# Patient Record
Sex: Female | Born: 1950 | Race: Black or African American | Hispanic: No | Smoking: Current some day smoker
Health system: Southern US, Community
[De-identification: ages and names within clinical notes are randomized; demographics above are authoritative.]

## PROBLEM LIST (undated history)

## (undated) DIAGNOSIS — I639 Cerebral infarction, unspecified: Secondary | ICD-10-CM

## (undated) DIAGNOSIS — K219 Gastro-esophageal reflux disease without esophagitis: Secondary | ICD-10-CM

## (undated) DIAGNOSIS — R05 Cough: Secondary | ICD-10-CM

## (undated) DIAGNOSIS — R059 Cough, unspecified: Secondary | ICD-10-CM

## (undated) DIAGNOSIS — D219 Benign neoplasm of connective and other soft tissue, unspecified: Secondary | ICD-10-CM

## (undated) DIAGNOSIS — M549 Dorsalgia, unspecified: Secondary | ICD-10-CM

## (undated) DIAGNOSIS — N189 Chronic kidney disease, unspecified: Secondary | ICD-10-CM

## (undated) DIAGNOSIS — J449 Chronic obstructive pulmonary disease, unspecified: Secondary | ICD-10-CM

## (undated) DIAGNOSIS — Z720 Tobacco use: Secondary | ICD-10-CM

## (undated) DIAGNOSIS — M5416 Radiculopathy, lumbar region: Secondary | ICD-10-CM

## (undated) DIAGNOSIS — I10 Essential (primary) hypertension: Secondary | ICD-10-CM

## (undated) DIAGNOSIS — M1990 Unspecified osteoarthritis, unspecified site: Secondary | ICD-10-CM

## (undated) DIAGNOSIS — E785 Hyperlipidemia, unspecified: Secondary | ICD-10-CM

## (undated) DIAGNOSIS — K5900 Constipation, unspecified: Secondary | ICD-10-CM

## (undated) DIAGNOSIS — E0590 Thyrotoxicosis, unspecified without thyrotoxic crisis or storm: Secondary | ICD-10-CM

## (undated) DIAGNOSIS — R011 Cardiac murmur, unspecified: Secondary | ICD-10-CM

## (undated) DIAGNOSIS — N179 Acute kidney failure, unspecified: Secondary | ICD-10-CM

## (undated) DIAGNOSIS — K635 Polyp of colon: Secondary | ICD-10-CM

## (undated) DIAGNOSIS — E119 Type 2 diabetes mellitus without complications: Secondary | ICD-10-CM

## (undated) DIAGNOSIS — G8929 Other chronic pain: Secondary | ICD-10-CM

## (undated) DIAGNOSIS — Z872 Personal history of diseases of the skin and subcutaneous tissue: Secondary | ICD-10-CM

## (undated) DIAGNOSIS — R0602 Shortness of breath: Secondary | ICD-10-CM

## (undated) DIAGNOSIS — I739 Peripheral vascular disease, unspecified: Secondary | ICD-10-CM

## (undated) DIAGNOSIS — K5790 Diverticulosis of intestine, part unspecified, without perforation or abscess without bleeding: Secondary | ICD-10-CM

## (undated) HISTORY — DX: Tobacco use: Z72.0

## (undated) HISTORY — DX: Cough: R05

## (undated) HISTORY — PX: ABDOMINAL HYSTERECTOMY: SHX81

## (undated) HISTORY — DX: Personal history of diseases of the skin and subcutaneous tissue: Z87.2

## (undated) HISTORY — DX: Type 2 diabetes mellitus without complications: E11.9

## (undated) HISTORY — DX: Polyp of colon: K63.5

## (undated) HISTORY — DX: Thyrotoxicosis, unspecified without thyrotoxic crisis or storm: E05.90

## (undated) HISTORY — DX: Cardiac murmur, unspecified: R01.1

## (undated) HISTORY — DX: Constipation, unspecified: K59.00

## (undated) HISTORY — DX: Chronic obstructive pulmonary disease, unspecified: J44.9

## (undated) HISTORY — DX: Peripheral vascular disease, unspecified: I73.9

## (undated) HISTORY — DX: Acute kidney failure, unspecified: N17.9

## (undated) HISTORY — DX: Diverticulosis of intestine, part unspecified, without perforation or abscess without bleeding: K57.90

## (undated) HISTORY — DX: Benign neoplasm of connective and other soft tissue, unspecified: D21.9

## (undated) HISTORY — PX: COLONOSCOPY W/ BIOPSIES: SHX1374

## (undated) HISTORY — DX: Unspecified osteoarthritis, unspecified site: M19.90

## (undated) HISTORY — DX: Hyperlipidemia, unspecified: E78.5

## (undated) HISTORY — DX: Cough, unspecified: R05.9

## (undated) HISTORY — DX: Cerebral infarction, unspecified: I63.9

---

## 2004-07-23 ENCOUNTER — Ambulatory Visit: Payer: Self-pay | Admitting: Internal Medicine

## 2004-08-19 ENCOUNTER — Ambulatory Visit: Payer: Self-pay | Admitting: Internal Medicine

## 2004-08-23 ENCOUNTER — Ambulatory Visit (HOSPITAL_COMMUNITY): Admission: RE | Admit: 2004-08-23 | Discharge: 2004-08-23 | Payer: Self-pay | Admitting: Internal Medicine

## 2004-08-23 ENCOUNTER — Ambulatory Visit: Payer: Self-pay | Admitting: Internal Medicine

## 2004-11-09 ENCOUNTER — Emergency Department (HOSPITAL_COMMUNITY): Admission: EM | Admit: 2004-11-09 | Discharge: 2004-11-09 | Payer: Self-pay | Admitting: Emergency Medicine

## 2004-11-15 ENCOUNTER — Ambulatory Visit: Payer: Self-pay | Admitting: Internal Medicine

## 2005-10-17 ENCOUNTER — Ambulatory Visit: Payer: Self-pay | Admitting: Internal Medicine

## 2005-10-26 ENCOUNTER — Ambulatory Visit: Payer: Self-pay | Admitting: Internal Medicine

## 2006-02-21 ENCOUNTER — Ambulatory Visit: Payer: Self-pay | Admitting: Internal Medicine

## 2006-02-24 ENCOUNTER — Ambulatory Visit: Payer: Self-pay | Admitting: Internal Medicine

## 2006-04-25 ENCOUNTER — Ambulatory Visit: Payer: Self-pay | Admitting: Internal Medicine

## 2006-04-25 LAB — CONVERTED CEMR LAB
ALT: 17 units/L (ref 0–40)
AST: 20 units/L (ref 0–37)
Albumin: 3.6 g/dL (ref 3.5–5.2)
Alkaline Phosphatase: 64 units/L (ref 39–117)
BUN: 9 mg/dL (ref 6–23)
Basophils Absolute: 0.1 10*3/uL (ref 0.0–0.1)
Basophils Relative: 1.1 % — ABNORMAL HIGH (ref 0.0–1.0)
Bilirubin Urine: NEGATIVE
Bilirubin, Direct: 0.1 mg/dL (ref 0.0–0.3)
CO2: 30 meq/L (ref 19–32)
Calcium: 9.3 mg/dL (ref 8.4–10.5)
Chloride: 99 meq/L (ref 96–112)
Cholesterol: 242 mg/dL (ref 0–200)
Creatinine, Ser: 0.7 mg/dL (ref 0.4–1.2)
Crystals: NEGATIVE
Direct LDL: 176 mg/dL
Eosinophils Absolute: 0.2 10*3/uL (ref 0.0–0.6)
Eosinophils Relative: 2.9 % (ref 0.0–5.0)
GFR calc Af Amer: 111 mL/min
GFR calc non Af Amer: 92 mL/min
Glucose, Bld: 144 mg/dL — ABNORMAL HIGH (ref 70–99)
HCT: 39 % (ref 36.0–46.0)
HDL: 44 mg/dL (ref >39.0)
Hemoglobin, Urine: NEGATIVE
Hemoglobin: 13.6 g/dL (ref 12.0–15.0)
Hgb A1c MFr Bld: 7 % — ABNORMAL HIGH (ref 4.6–6.0)
Ketones, ur: NEGATIVE mg/dL
Leukocytes, UA: NEGATIVE
Lymphocytes Relative: 45.5 % (ref 12.0–46.0)
MCHC: 34.9 g/dL (ref 30.0–36.0)
MCV: 100.7 fL — ABNORMAL HIGH (ref 78.0–100.0)
Monocytes Absolute: 0.6 10*3/uL (ref 0.2–0.7)
Monocytes Relative: 9.9 % (ref 3.0–11.0)
Mucus, UA: NEGATIVE
Neutro Abs: 2.4 10*3/uL (ref 1.4–7.7)
Neutrophils Relative %: 40.6 % — ABNORMAL LOW (ref 43.0–77.0)
Nitrite: NEGATIVE
Platelets: 268 10*3/uL (ref 150–400)
Potassium: 3.8 meq/L (ref 3.5–5.1)
RBC / HPF: NONE SEEN
RBC: 3.88 M/uL (ref 3.87–5.11)
RDW: 12.7 % (ref 11.5–14.6)
Sodium: 135 meq/L (ref 135–145)
Specific Gravity, Urine: 1.025 (ref 1.000–1.03)
TSH: 2.23 microintl units/mL (ref 0.35–5.50)
Total Bilirubin: 0.9 mg/dL (ref 0.3–1.2)
Total CHOL/HDL Ratio: 5.5
Total Protein, Urine: 30 mg/dL — AB
Total Protein: 6.8 g/dL (ref 6.0–8.3)
Triglycerides: 176 mg/dL — ABNORMAL HIGH (ref 0–149)
Urine Glucose: 250 mg/dL — AB
Urobilinogen, UA: 0.2 (ref 0.0–1.0)
VLDL: 35 mg/dL (ref 0–40)
WBC: 6 10*3/uL (ref 4.5–10.5)
pH: 6 (ref 5.0–8.0)

## 2006-05-02 ENCOUNTER — Ambulatory Visit: Payer: Self-pay | Admitting: Internal Medicine

## 2006-09-26 ENCOUNTER — Ambulatory Visit: Payer: Self-pay | Admitting: Internal Medicine

## 2006-09-27 ENCOUNTER — Encounter: Payer: Self-pay | Admitting: Internal Medicine

## 2006-09-27 DIAGNOSIS — I160 Hypertensive urgency: Secondary | ICD-10-CM | POA: Insufficient documentation

## 2006-09-27 DIAGNOSIS — E0590 Thyrotoxicosis, unspecified without thyrotoxic crisis or storm: Secondary | ICD-10-CM

## 2006-09-27 DIAGNOSIS — E785 Hyperlipidemia, unspecified: Secondary | ICD-10-CM

## 2006-09-27 DIAGNOSIS — E1351 Other specified diabetes mellitus with diabetic peripheral angiopathy without gangrene: Secondary | ICD-10-CM | POA: Insufficient documentation

## 2006-09-27 DIAGNOSIS — F17200 Nicotine dependence, unspecified, uncomplicated: Secondary | ICD-10-CM | POA: Insufficient documentation

## 2006-09-27 DIAGNOSIS — I10 Essential (primary) hypertension: Secondary | ICD-10-CM

## 2006-10-10 ENCOUNTER — Ambulatory Visit: Payer: Self-pay | Admitting: Internal Medicine

## 2006-10-10 ENCOUNTER — Ambulatory Visit (HOSPITAL_COMMUNITY): Admission: RE | Admit: 2006-10-10 | Discharge: 2006-10-10 | Payer: Self-pay | Admitting: Internal Medicine

## 2006-12-13 ENCOUNTER — Encounter: Payer: Self-pay | Admitting: *Deleted

## 2006-12-13 DIAGNOSIS — Z9079 Acquired absence of other genital organ(s): Secondary | ICD-10-CM | POA: Insufficient documentation

## 2006-12-13 DIAGNOSIS — L723 Sebaceous cyst: Secondary | ICD-10-CM

## 2007-02-21 ENCOUNTER — Encounter: Payer: Self-pay | Admitting: Internal Medicine

## 2007-05-08 ENCOUNTER — Encounter: Payer: Self-pay | Admitting: Endocrinology

## 2008-02-07 ENCOUNTER — Telehealth: Payer: Self-pay | Admitting: Internal Medicine

## 2008-02-22 ENCOUNTER — Telehealth: Payer: Self-pay | Admitting: Internal Medicine

## 2008-02-27 ENCOUNTER — Telehealth: Payer: Self-pay | Admitting: Internal Medicine

## 2008-04-09 ENCOUNTER — Encounter: Payer: Self-pay | Admitting: Internal Medicine

## 2008-04-09 ENCOUNTER — Ambulatory Visit: Payer: Self-pay | Admitting: Internal Medicine

## 2008-04-09 DIAGNOSIS — R011 Cardiac murmur, unspecified: Secondary | ICD-10-CM | POA: Insufficient documentation

## 2008-04-09 DIAGNOSIS — L600 Ingrowing nail: Secondary | ICD-10-CM

## 2008-04-09 LAB — CONVERTED CEMR LAB
ALT: 33 units/L (ref 0–35)
AST: 28 units/L (ref 0–37)
Albumin: 3.9 g/dL (ref 3.5–5.2)
Alkaline Phosphatase: 61 units/L (ref 39–117)
BUN: 13 mg/dL (ref 6–23)
Basophils Absolute: 0.1 10*3/uL (ref 0.0–0.1)
Basophils Relative: 1 % (ref 0.0–3.0)
Bilirubin Urine: NEGATIVE
Bilirubin, Direct: 0.1 mg/dL (ref 0.0–0.3)
CO2: 34 meq/L — ABNORMAL HIGH (ref 19–32)
Calcium: 9.3 mg/dL (ref 8.4–10.5)
Chloride: 101 meq/L (ref 96–112)
Cholesterol: 194 mg/dL (ref 0–200)
Creatinine, Ser: 0.7 mg/dL (ref 0.4–1.2)
Creatinine,U: 206 mg/dL
Eosinophils Absolute: 0.1 10*3/uL (ref 0.0–0.7)
Eosinophils Relative: 1.6 % (ref 0.0–5.0)
GFR calc non Af Amer: 110.45 mL/min (ref >60)
Glucose, Bld: 153 mg/dL — ABNORMAL HIGH (ref 70–99)
HCT: 41.5 % (ref 36.0–46.0)
HDL: 52.1 mg/dL (ref >39.00)
Hemoglobin, Urine: NEGATIVE
Hemoglobin: 14 g/dL (ref 12.0–15.0)
Hgb A1c MFr Bld: 6.9 % — ABNORMAL HIGH (ref 4.6–6.5)
LDL Cholesterol: 122 mg/dL — ABNORMAL HIGH (ref 0–99)
Leukocytes, UA: NEGATIVE
Lymphocytes Relative: 37.2 % (ref 12.0–46.0)
Lymphs Abs: 2.2 10*3/uL (ref 0.7–4.0)
MCHC: 33.8 g/dL (ref 30.0–36.0)
MCV: 98.1 fL (ref 78.0–100.0)
Microalb Creat Ratio: 679.6 mg/g — ABNORMAL HIGH (ref 0.0–30.0)
Microalb, Ur: 140 mg/dL — ABNORMAL HIGH (ref 0.0–1.9)
Monocytes Absolute: 0.4 10*3/uL (ref 0.1–1.0)
Monocytes Relative: 6.4 % (ref 3.0–12.0)
Neutro Abs: 3.1 10*3/uL (ref 1.4–7.7)
Neutrophils Relative %: 53.8 % (ref 43.0–77.0)
Nitrite: NEGATIVE
Platelets: 262 10*3/uL (ref 150.0–400.0)
Potassium: 3.6 meq/L (ref 3.5–5.1)
RBC: 4.23 M/uL (ref 3.87–5.11)
RDW: 12.3 % (ref 11.5–14.6)
Sodium: 141 meq/L (ref 135–145)
Specific Gravity, Urine: 1.025 (ref 1.000–1.030)
TSH: 1.18 microintl units/mL (ref 0.35–5.50)
Total Bilirubin: 0.9 mg/dL (ref 0.3–1.2)
Total CHOL/HDL Ratio: 4
Total Protein, Urine: >=300 mg/dL
Total Protein: 7.2 g/dL (ref 6.0–8.3)
Triglycerides: 100 mg/dL (ref 0.0–149.0)
Urine Glucose: NEGATIVE mg/dL
Urobilinogen, UA: 0.2 (ref 0.0–1.0)
VLDL: 20 mg/dL (ref 0.0–40.0)
WBC: 5.9 10*3/uL (ref 4.5–10.5)
pH: 5.5 (ref 5.0–8.0)

## 2008-04-11 ENCOUNTER — Ambulatory Visit: Payer: Self-pay | Admitting: Cardiovascular Disease

## 2008-04-11 DIAGNOSIS — R9431 Abnormal electrocardiogram [ECG] [EKG]: Secondary | ICD-10-CM

## 2008-04-14 ENCOUNTER — Ambulatory Visit: Payer: Self-pay | Admitting: Internal Medicine

## 2008-04-16 ENCOUNTER — Encounter: Admission: RE | Admit: 2008-04-16 | Discharge: 2008-04-16 | Payer: Self-pay | Admitting: Internal Medicine

## 2008-04-24 ENCOUNTER — Encounter: Payer: Self-pay | Admitting: Cardiovascular Disease

## 2008-04-24 ENCOUNTER — Ambulatory Visit: Payer: Self-pay

## 2008-04-30 ENCOUNTER — Ambulatory Visit: Payer: Self-pay | Admitting: Internal Medicine

## 2008-07-23 ENCOUNTER — Telehealth: Payer: Self-pay | Admitting: Internal Medicine

## 2008-07-31 ENCOUNTER — Encounter (INDEPENDENT_AMBULATORY_CARE_PROVIDER_SITE_OTHER): Payer: Self-pay | Admitting: *Deleted

## 2008-08-29 ENCOUNTER — Ambulatory Visit: Payer: Self-pay | Admitting: Internal Medicine

## 2008-09-03 ENCOUNTER — Telehealth: Payer: Self-pay | Admitting: Internal Medicine

## 2008-09-04 ENCOUNTER — Ambulatory Visit: Payer: Self-pay | Admitting: Internal Medicine

## 2008-09-04 LAB — CONVERTED CEMR LAB
ALT: 16 units/L (ref 0–35)
AST: 20 units/L (ref 0–37)
Albumin: 3.7 g/dL (ref 3.5–5.2)
Alkaline Phosphatase: 63 units/L (ref 39–117)
BUN: 16 mg/dL (ref 6–23)
Basophils Absolute: 0 10*3/uL (ref 0.0–0.1)
Basophils Relative: 0.6 % (ref 0.0–3.0)
Bilirubin Urine: NEGATIVE
Bilirubin, Direct: 0.1 mg/dL (ref 0.0–0.3)
CO2: 28 meq/L (ref 19–32)
Calcium: 8.9 mg/dL (ref 8.4–10.5)
Chloride: 105 meq/L (ref 96–112)
Cholesterol: 160 mg/dL (ref 0–200)
Creatinine, Ser: 0.7 mg/dL (ref 0.4–1.2)
Eosinophils Absolute: 0.2 10*3/uL (ref 0.0–0.7)
Eosinophils Relative: 2.5 % (ref 0.0–5.0)
GFR calc non Af Amer: 110.3 mL/min (ref >60)
Glucose, Bld: 91 mg/dL (ref 70–99)
HCT: 39 % (ref 36.0–46.0)
HDL: 46.7 mg/dL (ref >39.00)
Hemoglobin, Urine: NEGATIVE
Hemoglobin: 13.4 g/dL (ref 12.0–15.0)
Ketones, ur: NEGATIVE mg/dL
LDL Cholesterol: 84 mg/dL (ref 0–99)
Leukocytes, UA: NEGATIVE
Lymphocytes Relative: 46.5 % — ABNORMAL HIGH (ref 12.0–46.0)
Lymphs Abs: 3.1 10*3/uL (ref 0.7–4.0)
MCHC: 34.4 g/dL (ref 30.0–36.0)
MCV: 98.9 fL (ref 78.0–100.0)
Monocytes Absolute: 0.6 10*3/uL (ref 0.1–1.0)
Monocytes Relative: 9.2 % (ref 3.0–12.0)
Neutro Abs: 2.7 10*3/uL (ref 1.4–7.7)
Neutrophils Relative %: 41.2 % — ABNORMAL LOW (ref 43.0–77.0)
Nitrite: NEGATIVE
Platelets: 199 10*3/uL (ref 150.0–400.0)
Potassium: 3.2 meq/L — ABNORMAL LOW (ref 3.5–5.1)
RBC: 3.94 M/uL (ref 3.87–5.11)
RDW: 12.1 % (ref 11.5–14.6)
Sodium: 140 meq/L (ref 135–145)
Specific Gravity, Urine: 1.025 (ref 1.000–1.030)
TSH: 1.56 microintl units/mL (ref 0.35–5.50)
Total Bilirubin: 1 mg/dL (ref 0.3–1.2)
Total CHOL/HDL Ratio: 3
Total Protein, Urine: 100 mg/dL
Total Protein: 6.6 g/dL (ref 6.0–8.3)
Triglycerides: 146 mg/dL (ref 0.0–149.0)
Urine Glucose: NEGATIVE mg/dL
Urobilinogen, UA: 0.2 (ref 0.0–1.0)
VLDL: 29.2 mg/dL (ref 0.0–40.0)
WBC: 6.6 10*3/uL (ref 4.5–10.5)
pH: 5.5 (ref 5.0–8.0)

## 2008-09-09 ENCOUNTER — Ambulatory Visit: Payer: Self-pay | Admitting: Internal Medicine

## 2008-09-11 ENCOUNTER — Encounter: Payer: Self-pay | Admitting: Internal Medicine

## 2008-09-11 ENCOUNTER — Ambulatory Visit: Payer: Self-pay | Admitting: Internal Medicine

## 2008-09-11 HISTORY — PX: COLONOSCOPY W/ BIOPSIES: SHX1374

## 2008-09-21 ENCOUNTER — Encounter: Payer: Self-pay | Admitting: Internal Medicine

## 2009-04-03 ENCOUNTER — Ambulatory Visit: Payer: Self-pay | Admitting: Endocrinology

## 2009-04-03 DIAGNOSIS — R05 Cough: Secondary | ICD-10-CM

## 2009-04-06 ENCOUNTER — Telehealth: Payer: Self-pay | Admitting: Endocrinology

## 2009-04-20 ENCOUNTER — Ambulatory Visit: Payer: Self-pay | Admitting: Internal Medicine

## 2009-05-01 ENCOUNTER — Telehealth: Payer: Self-pay | Admitting: Internal Medicine

## 2009-05-01 ENCOUNTER — Ambulatory Visit: Payer: Self-pay | Admitting: Endocrinology

## 2009-05-19 ENCOUNTER — Ambulatory Visit: Payer: Self-pay | Admitting: Internal Medicine

## 2009-09-04 ENCOUNTER — Ambulatory Visit: Payer: Self-pay | Admitting: Internal Medicine

## 2009-09-04 LAB — CONVERTED CEMR LAB
ALT: 17 units/L (ref 0–35)
AST: 20 units/L (ref 0–37)
Albumin: 3.8 g/dL (ref 3.5–5.2)
Alkaline Phosphatase: 57 units/L (ref 39–117)
BUN: 23 mg/dL (ref 6–23)
Basophils Absolute: 0.1 10*3/uL (ref 0.0–0.1)
Basophils Relative: 0.8 % (ref 0.0–3.0)
Bilirubin Urine: NEGATIVE
Bilirubin, Direct: 0.2 mg/dL (ref 0.0–0.3)
CO2: 25 meq/L (ref 19–32)
Calcium: 9 mg/dL (ref 8.4–10.5)
Chloride: 103 meq/L (ref 96–112)
Cholesterol: 171 mg/dL (ref 0–200)
Creatinine, Ser: 0.8 mg/dL (ref 0.4–1.2)
Eosinophils Absolute: 0.2 10*3/uL (ref 0.0–0.7)
Eosinophils Relative: 3.7 % (ref 0.0–5.0)
GFR calc non Af Amer: 97.02 mL/min (ref >60)
Glucose, Bld: 79 mg/dL (ref 70–99)
HCT: 35.5 % — ABNORMAL LOW (ref 36.0–46.0)
HDL: 51.5 mg/dL (ref >39.00)
Hemoglobin, Urine: NEGATIVE
Hemoglobin: 12.1 g/dL (ref 12.0–15.0)
Ketones, ur: NEGATIVE mg/dL
LDL Cholesterol: 103 mg/dL — ABNORMAL HIGH (ref 0–99)
Leukocytes, UA: NEGATIVE
Lymphocytes Relative: 37.7 % (ref 12.0–46.0)
Lymphs Abs: 2.4 10*3/uL (ref 0.7–4.0)
MCHC: 34.3 g/dL (ref 30.0–36.0)
MCV: 98.8 fL (ref 78.0–100.0)
Monocytes Absolute: 0.6 10*3/uL (ref 0.1–1.0)
Monocytes Relative: 8.8 % (ref 3.0–12.0)
Neutro Abs: 3.1 10*3/uL (ref 1.4–7.7)
Neutrophils Relative %: 49 % (ref 43.0–77.0)
Nitrite: NEGATIVE
Platelets: 190 10*3/uL (ref 150.0–400.0)
Potassium: 3.6 meq/L (ref 3.5–5.1)
RBC: 3.59 M/uL — ABNORMAL LOW (ref 3.87–5.11)
RDW: 13.3 % (ref 11.5–14.6)
Sodium: 138 meq/L (ref 135–145)
Specific Gravity, Urine: 1.02 (ref 1.000–1.030)
TSH: 0.77 microintl units/mL (ref 0.35–5.50)
Total Bilirubin: 0.8 mg/dL (ref 0.3–1.2)
Total CHOL/HDL Ratio: 3
Total Protein, Urine: 30 mg/dL
Total Protein: 6.8 g/dL (ref 6.0–8.3)
Triglycerides: 82 mg/dL (ref 0.0–149.0)
Urine Glucose: NEGATIVE mg/dL
Urobilinogen, UA: 0.2 (ref 0.0–1.0)
VLDL: 16.4 mg/dL (ref 0.0–40.0)
WBC: 6.4 10*3/uL (ref 4.5–10.5)
pH: 5.5 (ref 5.0–8.0)

## 2009-09-10 ENCOUNTER — Ambulatory Visit: Payer: Self-pay | Admitting: Internal Medicine

## 2009-09-10 DIAGNOSIS — M79609 Pain in unspecified limb: Secondary | ICD-10-CM

## 2009-09-10 DIAGNOSIS — R609 Edema, unspecified: Secondary | ICD-10-CM

## 2009-09-10 LAB — CONVERTED CEMR LAB: Hgb A1c MFr Bld: 6.5 % (ref 4.6–6.5)

## 2009-09-16 ENCOUNTER — Encounter: Admission: RE | Admit: 2009-09-16 | Discharge: 2009-09-16 | Payer: Self-pay | Admitting: Internal Medicine

## 2010-02-23 NOTE — Assessment & Plan Note (Signed)
Summary: BP HIGH/NORINS/LB   Vital Signs:  Patient profile:   60 year old female Height:      67 inches (170.18 cm) Weight:      157.13 pounds (71.42 kg) O2 Sat:      97 % on Room air Temp:     98.1 degrees F (36.72 degrees C) oral Pulse rate:   75 / minute BP sitting:   160 / 80  (left arm)  Vitals Entered By: Gardenia Phlegm RMA (May 01, 2009 3:46 PM)  O2 Flow:  Room air CC: BP high this morning- pt states it was 202/104 at [facility] Is Patient Diabetic? Yes   Primary Provider:  Neena Rhymes MD  CC:  BP high this morning- pt states it was 202/104 at [facility]/ CF.  History of Present Illness: see above.  pt states she feels well in general.  she takes meds as rx'ed.  Current Medications (verified): 1)  Estradiol 0.5 Mg Tabs (Estradiol) .... Take 1 Tablet By Mouth Once A Day 2)  Furosemide 40 Mg  Tabs (Furosemide) .... Once Daily 3)  Amlodipine Besy-Benazepril Hcl 10-20 Mg Caps (Amlodipine Besy-Benazepril Hcl) .[name].. 1 By Mouth Once Daily 4)  Vytorin 10-20 Mg  Tabs (Ezetimibe-Simvastatin) .... Q Pm 5)  Metformin Hcl 1000 Mg  Tabs (Metformin Hcl) .... Two Times A Day 6)  Contour Usb Blood Glucose Sys W/device Kit (Blood Glucose Monitoring Suppl) .... Use Two Times A Day As Directed 7)  Bayer Contour Test  Strp (Glucose Blood) .... Use Two Times A Day As Directed 8)  Metoprolol Succinate 100 Mg Xr24h-Tab (Metoprolol Succinate) .[name].. 1 By Mouth Once Daily For Blood Pressure 9)  Cefuroxime Axetil 250 Mg Tabs (Cefuroxime Axetil) .[name].. 1 Tab Two Times A Day  Allergies (verified): No Known Drug Allergies  Past History:  Past Medical History: Last updated: 04/11/2008 Hx of SEBACEOUS CYST, NECK (ICD-706.2) HYPERTENSION, ESSENTIAL NOS (ICD-401.9) with LVH on ECG TOBACCO ABUSE (ICD-305.1) COUGH (ICD-786.2) HYPERTHYROIDISM (ICD-242.90) HYPERLIPIDEMIA (ICD-272.4) DIABETES MELLITUS, TYPE II (ICD-250.00)    Review of Systems  The patient denies dyspnea on exertion.     Physical Exam  General:  normal appearance.   Extremities:  no edema   Impression & Recommendations:  Problem # 1:  HYPERTENSION, ESSENTIAL NOS (ICD-401.9) predominantly systolic  Medications Added to Medication List This Visit: 1)  Triamterene-hctz 37.5-25 Mg Tabs (Triamterene-hctz) .... 1/2 tab once daily  Other Orders: Est. Patient Level III [id number]  Patient Instructions: 1)  stop furosemide 2)  restart triamterene-hctz, 1/2 pill per day. 3)  see dr Linda Hedges in 2 weeks. Prescriptions: TRIAMTERENE-HCTZ 37.5-25 MG TABS (TRIAMTERENE-HCTZ) 1/2 tab once daily  #30 x 1   Entered and Authorized by:   Donavan Foil MD   Signed by:   Donavan Foil MD on 05/01/2009   Method used:   [facility]        [facility] [address] [phone]* (retail)       [address]       [place], [place]  [address]       Ph: [phone] or [phone]       Fax: [phone]   RxID:   [id number]

## 2010-02-23 NOTE — Assessment & Plan Note (Signed)
Summary: PER PT PHYSICAL--BCBS--STC   Vital Signs:  Patient profile:   60 year old female Height:      67 inches Weight:      152 pounds BMI:     23.89 O2 Sat:      97 % on Room air Temp:     98.4 degrees F oral Pulse rate:   87 / minute BP sitting:   172 / 90  (left arm) Cuff size:   regular  Vitals Entered By: Charlynne Cousins CMA (September 10, 2009 1:30 PM)  O2 Flow:  Room air CC: cpx/ ab  Vision Screening:      Vision Comments: Pt had normal eye exam last year sometime. Unsure of date. But states she is due for an eye exam   Primary Care Provider:  Neena Rhymes MD  CC:  cpx/ ab.  History of Present Illness: [name] is a 60 yo AA F with a history of HTN, hyperlipidemia, and DM type II, presenting for her annual CPX and medication refills.   In the past year [name] reports generally being in good health with only an episode of tonsillitis successfully treated after an urgent care visit and removal of an ingrown toe nail.  Today she complains of a 1 month history of muscle pain in her lower legs bilaterally. She describes this pain as throbbing in nature. She denies a history of trauma, intense exercise, angina pectoris, or dyspnea.   She also complains of a 1 month history of swelling in her hands and feet. These episodes occur at various times of the day, not associated with eating or medication and resolves spontaneously. She is able to make a fist during these episodes and put on her shoes.   [name] has a 10 pack year history of smoking and expresses an interest in quitting.  Current Medications (verified): 1)  Estradiol 0.5 Mg Tabs (Estradiol) .... Take 1 Tablet By Mouth Once A Day 2)  Amlodipine Besy-Benazepril Hcl 10-20 Mg Caps (Amlodipine Besy-Benazepril Hcl) .[name].. 1 By Mouth Once Daily 3)  Vytorin 10-20 Mg  Tabs (Ezetimibe-Simvastatin) .... Q Pm 4)  Metformin Hcl 1000 Mg  Tabs (Metformin Hcl) .... Two Times A Day 5)  Contour Usb Blood Glucose  Sys W/device Kit (Blood Glucose Monitoring Suppl) .... Use Two Times A Day As Directed 6)  Bayer Contour Test  Strp (Glucose Blood) .... Use Two Times A Day As Directed 7)  Metoprolol Succinate 100 Mg Xr24h-Tab (Metoprolol Succinate) .[name].. 1 By Mouth Once Daily For Blood Pressure 8)  Triamterene-Hctz 37.5-25 Mg Tabs (Triamterene-Hctz) .... 1/2 Tab Once Daily  Allergies (verified): No Known Drug Allergies  Past History:  Past Medical History: Last updated: 04/11/2008 Hx of SEBACEOUS CYST, NECK (ICD-706.2) HYPERTENSION, ESSENTIAL NOS (ICD-401.9) with LVH on ECG TOBACCO ABUSE (ICD-305.1) COUGH (ICD-786.2) HYPERTHYROIDISM (ICD-242.90) HYPERLIPIDEMIA (ICD-272.4) DIABETES MELLITUS, TYPE II (ICD-250.00)    Past Surgical History: Last updated: 12/13/2006 HYSTERECTOMY, HX OF (ICD-V45.77)  Family History: Last updated: 10-05-08 Father-deceased @ 4: died of lung cancer, mother - deceased 75:DM, HTN sleep apnea  Neg - breast or colon cancer  Social History: Last updated: 10/05/08 11th grade married '84-13 yrs/divorce 1 son - '68;  Sexually active - using condoms 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  positive - frequency, nocturia x 1  Physical Exam  General:  alert and normal appearance.   Head:  normocephalic and atraumatic.   Eyes:  pupils equal, pupils round, and pupils reactive to light.   Ears:  R ear normal and L ear normal.   Nose:  no external deformity and no external erythema.   Mouth:  no gingival abnormalities and pharynx pink and moist.   Lungs:  normal respiratory effort, no accessory muscle use, no crackles, and no wheezes.   Heart:  normal rate and regular rhythm. Systolic  murmur heard at the aortic and pulmonary valve areas. Abdomen:  soft, non-tender, no distention, no masses, no hepatomegaly, and no splenomegaly.   Pulses:  R radial normal, R posterior tibial normal, L radial normal, and L dorsalis pedis normal.   Extremities:  No edema. Neurologic:  alert & oriented X3 and decreased right patellar reflex. decreased right triceps and biceps reflex. All other DTRs symmetrical and normal.  Left heel has diminished sensation to light touch. Normal sensation to pinprick and vibration. Skin:  turgor normal, color normal, no rashes, and no suspicious lesions.   Psych:  Oriented X3, normally interactive, good eye contact, not anxious appearing, and not depressed appearing.     Impression & Recommendations:  Problem # 1:  LEG PAIN, BILATERAL (ICD-729.5) Assessment - Patient's history and exam suggest her leg pain is secondary to the occupational stress of standing for the majority of her 8 hr shifts at [employer] for the last 36 years.   Plan - Reduce inflammation with (Aleve) Naproxen sodium, 220mg  TABS... 2x/daily          Advised to begin a stretching and exercise routine including basic calf raises and calf stretches.          Advised to invest in a better work shoe, with more comfort and support.           Problem # 2:  PERIPHERAL EDEMA (ICD-782.3) Assessment - Patient's episodes of swelling of her hands and feet are likely not due to an adverse drug reaction or diet. Currently of unknown etiology.  Plan - Monitor for new developments.           Triamterene-hctz 37.5-25 Mg Tabs (Triamterene-hctz) .[name]... 1 tablet daily  Problem # 3:  HYPERTENSION, ESSENTIAL NOS (ICD-401.9) Assessment - Patient's hypertension has been uncontrolled with systolic pressures running in the 150-180 range consistently over time. The patient is on 4 medications for her HTN though she has been taking half a tablet of triamterene-hctz 37.5-25 Mg.  Plan - Increase Triamterene-hctz  37.5-25 Mg Tabs (Triamterene-hctz) from half a tablet daily to one whole tablet daily.           Amlodipine Besy-benazepril Hcl 10-20 Mg Caps (Amlodipine besy-benazepril hcl) .[name]... 1 by mouth once daily          Metoprolol Succinate 100 Mg Xr24h-tab (Metoprolol succinate) .[name]... 1 by mouth once daily for blood pressure      Problem # 4:  HYPERLIPIDEMIA (ICD-272.4) Assessment - Patient's lipid levels are well-controlled as evidenced by lab work last week.  Plan - continue treatment as planned.   Her updated medication list for this problem includes:    Vytorin 10-20 Mg Tabs (Ezetimibe-simvastatin) ..... Q pm  Labs Reviewed: SGOT: 20 (09/04/2009)   SGPT: 17 (09/04/2009)  Prior 10 Yr Risk Heart Disease: 20 % (04/30/2008)   HDL:51.50 (09/04/2009), 46.70 (09/04/2008)  LDL:103 (09/04/2009), 84 (09/04/2008)  Chol:171 (09/04/2009), 160 (09/04/2008)  Trig:82.0 (  09/04/2009), 146.0 (09/04/2008)  Problem # 5:  DIABETES MELLITUS, TYPE II (ICD-250.00) Assessment - On 09/04/2009 Patient's glucose was 79, though her last Hgb A1C was 6.9% in March '10. She is developing minor peripheral neuropathy.   Plan - Obtain a Hgb A1C           Advised patient to schedule an eye exam.               Her updated medication list for this problem includes:    Amlodipine Besy-benazepril Hcl 10-20 Mg Caps (Amlodipine besy-benazepril hcl) .[name]... 1 by mouth once daily    Metformin Hcl 1000 Mg Tabs (Metformin hcl) .[name]..[name] Two times a day  Orders: TLB-A1C / Hgb A1C (Glycohemoglobin) (83036-A1C)  Addendum - Tests: (1) Hemoglobin A1C (A1C)   Hemoglobin A1C            6.5 %                       4.6-6.5     Glycemic Control Guidelines for People with Diabetes:     Non Diabetic:  <6%     Goal of Therapy: <7%     Additional Action Suggested:  >8%   Problem # 6:  TOBACCO ABUSE (ICD-305.1)  Assessment - Patient has a 10 pack year smoking history. She has tried to quit once before which lasted 6 months but was discouraged by  her weight gain. She states now that she is interested in giving quitting another try.  Plan - Patient was counseled on a strategy to quit smoking including -keeping a smoker's diary to assess her own behavior                                                                                                              -replacing her smoking habit with more constructive behaviors                                                                                                              -setting a quit date, taking steps to prepare for a quit date                                                                                                              -  available medications to help her succeed.             Orders: Tobacco use cessation intensive >10 minutes [id number])  Problem # 7:  Preventive Health Care (ICD-V70.0) Assessment - Patient due for mammogram  Plan - Mammogram scheduled at [facility]  Complete Medication List: 1)  Estradiol 0.5 Mg Tabs (Estradiol) .... Take 1 tablet by mouth once a day 2)  Amlodipine Besy-benazepril Hcl 10-20 Mg Caps (Amlodipine besy-benazepril hcl) .[name].. 1 by mouth once daily 3)  Vytorin 10-20 Mg Tabs (Ezetimibe-simvastatin) .... Q pm 4)  Metformin Hcl 1000 Mg Tabs (Metformin hcl) .... Two times a day 5)  Contour Usb Blood Glucose Sys W/device Kit (Blood glucose monitoring suppl) .... Use two times a day as directed 6)  Bayer Contour Test Strp (Glucose blood) .... Use two times a day as directed 7)  Metoprolol Succinate 100 Mg Xr24h-tab (Metoprolol succinate) .[name].. 1 by mouth once daily for blood pressure 8)  Triamterene-hctz 37.5-25 Mg Tabs (Triamterene-hctz) .[name].. 1 tablet daily  Other Orders: Radiology Referral (Radiology) Prescriptions: TRIAMTERENE-HCTZ 37.5-25 MG TABS (TRIAMTERENE-HCTZ) 1 tablet daily  #30 x 12   Entered and Authorized by:   Neena Rhymes MD   Signed by:   Neena Rhymes MD on 09/10/2009   Method used:   Electronically to         [facility] [address] [phone]* (retail)       [address]       [place], [place]  [address]       Ph: [phone] or [phone]       Fax: [phone]   RxID:   [id number] METOPROLOL SUCCINATE 100 MG XR24H-TAB (METOPROLOL SUCCINATE) 1 by mouth once daily for blood pressure  #30 x 12   Entered and Authorized by:   Neena Rhymes MD   Signed by:   Neena Rhymes MD on 09/10/2009   Method used:   Electronically to        [facility] [address] [phone]* (retail)       [place] [address]       [place], [place]  [address]       Ph: [phone] or [phone]       Fax: [phone]   RxID:   [id number] METFORMIN HCL 1000 MG  TABS (METFORMIN HCL) two times a day  #60 Tablet x 12   Entered and Authorized by:   Neena Rhymes MD   Signed by:   Neena Rhymes MD on 09/10/2009   Method used:   Electronically to        [facility] [address] [phone]* (retail)       [place] [address]       [place], [place]  [address]       Ph: [phone] or [phone]       Fax: [phone]   RxID:   [id number] VYTORIN 10-20 MG  TABS (EZETIMIBE-SIMVASTATIN) q PM  #30 Tablet x 12   Entered and Authorized by:   Neena Rhymes MD   Signed by:   Neena Rhymes MD on 09/10/2009   Method used:   Electronically to        [facility] [address] [phone]* (retail)       [place] [address]       [place], [place]  [address]       Ph: [phone] or [phone]       Fax: [phone]   [id number] AMLODIPINE BESY-BENAZEPRIL HCL 10-20 MG CAPS (AMLODIPINE BESY-BENAZEPRIL HCL) 1 by mouth once daily  #30 Capsule x 12   Entered and Authorized by:   Neena Rhymes MD   Signed by:   Neena Rhymes MD on 09/10/2009   Method used:   Electronically to        [facility]  [facility] [address] [phone]* (retail)       [place] [address]       [place], [place]  [address]       Ph: [phone] or  [phone]       Fax: [phone]   RxID:   [id number] ESTRADIOL 0.5 MG TABS (ESTRADIOL) Take 1 tablet by mouth once a day  #30 Tablet x 12   Entered and Authorized by:   Neena Rhymes MD   Signed by:   Neena Rhymes MD on 09/10/2009   Method used:   Electronically to        [facility] [address] [phone]* (retail)       [place] [address]       [place], [place]  [address]       Ph: [phone] or [phone]       Fax: [phone]   RxID:   [id number]   Tests: (1) BMP (METABOL)   Sodium                    138 mEq/L                   135-145   Potassium                 3.6 mEq/L                   3.5-5.1   Chloride                  103 mEq/L                   96-112   Carbon Dioxide            25 mEq/L                    19-32   Glucose                   79 mg/dL                    70-99   BUN                       23 mg/dL                    6-23   Creatinine                0.8 mg/dL                   0.4-1.2   Calcium                   9.0 mg/dL                   8.4-10.5  GFR                       97.02 mL/min                >60  Tests: (2) Lipid Panel (LIPID)   Cholesterol               171 mg/dL                   0-200     ATP III Classification            Desirable:  < 200 mg/dL                    Borderline High:  200 - 239 mg/dL               High:  > = 240 mg/dL   Triglycerides             82.0 mg/dL                  0.0-149.0     Normal:  <150 mg/dL     Borderline High:  150 - 199 mg/dL   HDL                       51.50 mg/dL                 >39.00   VLDL Cholesterol          16.4 mg/dL                  0.0-40.0   LDL Cholesterol      [H]  103 mg/dL                   0-99  CHO/HDL Ratio:  CHD Risk                             3                    Men          Women     1/2 Average Risk     3.4          3.3     Average Risk          5.0          4.4     2X Average Risk          9.6          7.1     3X Average Risk          15.0           11.0                           Tests: (3) CBC Platelet w/Diff (CBCD)   White Cell Count          6.4 K/uL                    4.5-10.5   Red Cell Count       [L]  3.59 Mil/uL                 3.87-5.11   Hemoglobin  12.1 g/dL                   12.0-15.0   Hematocrit           [L]  35.5 %                      36.0-46.0   MCV                       98.8 fl                     78.0-100.0   MCHC                      34.3 g/dL                   30.0-36.0   RDW                       13.3 %                      11.5-14.6   Platelet Count            190.0 K/uL                  150.0-400.0   Neutrophil %              49.0 %                      43.0-77.0   Lymphocyte %              37.7 %                      12.0-46.0   Monocyte %                8.8 %                       3.0-12.0   Eosinophils%              3.7 %                       0.0-5.0   Basophils %               0.8 %                       0.0-3.0   Neutrophill Absolute      3.1 K/uL                    1.4-7.7   Lymphocyte Absolute       2.4 K/uL                    0.7-4.0   Monocyte Absolute         0.6 K/uL                    0.1-1.0  Eosinophils, Absolute                             0.2 K/uL                    0.0-0.7   Basophils  Absolute        0.1 K/uL                    0.0-0.1  Tests: (4) Hepatic/Liver Function Panel (HEPATIC)   Total Bilirubin           0.8 mg/dL                   0.3-1.2   Direct Bilirubin          0.2 mg/dL                   0.0-0.3   Alkaline Phosphatase      57 U/L                      39-117   AST                       20 U/L                      0-37   ALT                       17 U/L                      0-35   Total Protein             6.8 g/dL                    6.0-8.3   Albumin                   3.8 g/dL                    3.5-5.2  Tests: (5) TSH (TSH)   FastTSH                   0.77 uIU/mL                 0.35-5.50  Tests: (6) UDip w/Micro (URINE)   Color                      LT. YELLOW       RANGE:  Yellow;Lt. Yellow   Clarity                   CLEAR                       Clear   Specific Gravity          1.020                       1.000 - 1.030   Urine Ph                  5.5                         5.0-8.0   Protein                   30                          Negative   Urine Glucose  NEGATIVE                    Negative   Ketones                   NEGATIVE                    Negative   Urine Bilirubin           NEGATIVE                    Negative   Blood                     NEGATIVE                    Negative   Urobilinogen              0.2                         0.0 - 1.0   Leukocyte Esterace        NEGATIVE                    Negative   Nitrite                   NEGATIVE                    Negative   Urine WBC                 0-2/hpf                     0-2/hpf   Urine Epith               Few(5-10/hpf)               Rare(0-4/hpf)   Urine Bacteria            Few(10-50/hpf)              None

## 2010-02-23 NOTE — Assessment & Plan Note (Signed)
Summary: COUGH/ NWS / PER TRIAGE/NWS   Vital Signs:  Patient profile:   60 year old female Height:      67 inches (170.18 cm) Weight:      154 pounds (70 kg) BMI:     24.21 O2 Sat:      95 % on Room air Temp:     97 degrees F (36.11 degrees C) oral Pulse rate:   71 / minute BP sitting:   180 / 92  (left arm) Cuff size:   regular  Vitals Entered By: Felipa Evener (April 03, 2009 2:15 PM)  O2 Flow:  Room air CC: cough, chills and chest tightening X 10 days   Primary Provider:  Neena Rhymes MD  CC:  cough and chills and chest tightening X 10 days.  History of Present Illness: pt states 9 days of slight prod-quality cough, chills, and associated wheezing.  no pain in the ears.    Current Medications (verified): 1)  Estradiol 0.5 Mg Tabs (Estradiol) .... Take 1 Tablet By Mouth Once A Day 2)  Furosemide 40 Mg  Tabs (Furosemide) .... Once Daily 3)  Amlodipine Besy-Benazepril Hcl 10-20 Mg Caps (Amlodipine Besy-Benazepril Hcl) .[name].. 1 By Mouth Once Daily 4)  Vytorin 10-20 Mg  Tabs (Ezetimibe-Simvastatin) .... Q Pm 5)  Metformin Hcl 1000 Mg  Tabs (Metformin Hcl) .... Two Times A Day 6)  Metoprolol Succinate 100 Mg Tb24 (Metoprolol Succinate) .... Take 1 Tablet By Mouth Once A Day 7)  Contour Usb Blood Glucose Sys W/device Kit (Blood Glucose Monitoring Suppl) .... Use Two Times A Day As Directed 8)  Bayer Contour Test  Strp (Glucose Blood) .... Use Two Times A Day As Directed  Allergies (verified): No Known Drug Allergies  Past History:  Past Medical History: Last updated: 04/11/2008 Hx of SEBACEOUS CYST, NECK (ICD-706.2) HYPERTENSION, ESSENTIAL NOS (ICD-401.9) with LVH on ECG TOBACCO ABUSE (ICD-305.1) COUGH (ICD-786.2) HYPERTHYROIDISM (ICD-242.90) HYPERLIPIDEMIA (ICD-272.4) DIABETES MELLITUS, TYPE II (ICD-250.00)    Review of Systems  The patient denies dyspnea on exertion.         denies sore throat.  Physical Exam  General:  no distress  Head:  head:  no deformity eyes: no periorbital swelling, no proptosis external nose and ears are normal mouth: no lesion seen Ears:  left tm is red.  right is normal Extremities:  no edema Additional Exam:  k+ was recently 3.2   Impression & Recommendations:  Problem # 1:  uri new  Problem # 2:  wheezing possibly exacerbated by metoprolol  Problem # 3:  hypokalemia mild--could be exacerbated by hctz  Problem # 4:  HYPERTENSION, ESSENTIAL NOS (ICD-401.9) could be exac by reduction of metoprolol  Medications Added to Medication List This Visit: 1)  Metoprolol Succinate 50 Mg Xr24h-tab (Metoprolol succinate) .[name].. 1 once daily 2)  Cefuroxime Axetil 250 Mg Tabs (Cefuroxime axetil) .[name].. 1 tab two times a day 3)  Triamterene-hctz 37.5-25 Mg Tabs (Triamterene-hctz) .... 1/2 tab once daily  Other Orders: T-2 View CXR (71020TC) Est. Patient Level IV [id number])  Patient Instructions: 1)  advair-100, 1 puff two times a day.  rinse mouth after using. 2)  reduce metoprolol-xr to 50 mg once daily. 3)  chest x ray today. 4)  cefuroxime 250 mg two times a day 5)  add triamterene-hctz, 1/2 once daily 6)  Please schedule a follow-up appointment with dr Linda Hedges in 2 weeks. Prescriptions: TRIAMTERENE-HCTZ 37.5-25 MG TABS (TRIAMTERENE-HCTZ) 1/2 tab once daily  #30 x 1  Entered and Authorized by:   Donavan Foil MD   Signed by:   Donavan Foil MD on 04/03/2009   Method used:   Electronically to        [facility] [address] [phone]* (retail)       [address]       [place], [place]  [address]       Ph: [phone] or [phone]       Fax: [phone]   RxID:   [id number] CEFUROXIME AXETIL 250 MG TABS (CEFUROXIME AXETIL) 1 tab two times a day  #14 x 0   Entered and Authorized by:   Donavan Foil MD   Signed by:   Donavan Foil MD on 04/03/2009   Method used:   Electronically to        [facility]  [facility] [address] [phone]* (retail)       [place] [address]       [place], [place]  [address]       Ph: [phone] or [phone]       Fax: [phone]   RxID:   [id number] METOPROLOL SUCCINATE 50 MG XR24H-TAB (METOPROLOL SUCCINATE) 1 once daily  #30 x 11   Entered and Authorized by:   Donavan Foil MD   Signed by:   Donavan Foil MD on 04/03/2009   Method used:   Electronically to        [facility]  [facility] [address] [phone]* (retail)       [place] [address]       [place],   [address]       Ph: [phone] or [phone]       Fax: [phone]   RxID:   [id number]

## 2010-02-23 NOTE — Assessment & Plan Note (Signed)
Summary: 2 wk f/u from sae/men pt/cd   Vital Signs:  Patient profile:   60 year old female Height:      67 inches Weight:      153 pounds BMI:     24.05 O2 Sat:      97 % on Room air Temp:     98.2 degrees F oral Pulse rate:   73 / minute BP sitting:   150 / 84  (left arm) Cuff size:   regular  Vitals Entered By: Charlynne Cousins CMA (April 20, 2009 3:05 PM)  O2 Flow:  Room air CC: pt here for follow up on chest tightness and SOB, pt states that her symptoms are much better/ ab   Primary Care Provider:  Neena Rhymes MD  CC:  pt here for follow up on chest tightness and SOB and pt states that her symptoms are much better/ ab.  History of Present Illness: Seen March 11th by Dr. Loanne Drilling - diagnosed with URI and treated with ceftin and Advair. She had a chest x-ray - reviewed - no acute disease. She finished the antibiotics and has completed the advair. She still has a tickle cough productive of clear phelgm. She stopped the claritin that she had formerly used.  No fever or chills, no diarrhea. No SOB.   Blood pressure was elevated when she saw Dr. Loanne Drilling. Chart reviewed and she has been poorly controlled. There has not been adequate improvement with the addition of a second diuretic.   Current Medications (verified): 1)  Estradiol 0.5 Mg Tabs (Estradiol) .... Take 1 Tablet By Mouth Once A Day 2)  Furosemide 40 Mg  Tabs (Furosemide) .... Once Daily 3)  Amlodipine Besy-Benazepril Hcl 10-20 Mg Caps (Amlodipine Besy-Benazepril Hcl) .[name].. 1 By Mouth Once Daily 4)  Vytorin 10-20 Mg  Tabs (Ezetimibe-Simvastatin) .... Q Pm 5)  Metformin Hcl 1000 Mg  Tabs (Metformin Hcl) .... Two Times A Day 6)  Contour Usb Blood Glucose Sys W/device Kit (Blood Glucose Monitoring Suppl) .... Use Two Times A Day As Directed 7)  Bayer Contour Test  Strp (Glucose Blood) .... Use Two Times A Day As Directed 8)  Metoprolol Succinate 50 Mg Xr24h-Tab (Metoprolol Succinate) .[name].. 1 Once Daily 9)  Cefuroxime Axetil  250 Mg Tabs (Cefuroxime Axetil) .[name].. 1 Tab Two Times A Day 10)  Triamterene-Hctz 37.5-25 Mg Tabs (Triamterene-Hctz) .... 1/2 Tab Once Daily 11)  Advair Diskus 100-50 Mcg/dose Aepb (Fluticasone-Salmeterol) .[name].. 1 Puff Two Times A Day  Allergies (verified): No Known Drug Allergies  Past History:  Past Medical History: Last updated: 04/11/2008 Hx of SEBACEOUS CYST, NECK (ICD-706.2) HYPERTENSION, ESSENTIAL NOS (ICD-401.9) with LVH on ECG TOBACCO ABUSE (ICD-305.1) COUGH (ICD-786.2) HYPERTHYROIDISM (ICD-242.90) HYPERLIPIDEMIA (ICD-272.4) DIABETES MELLITUS, TYPE II (ICD-250.00)    Past Surgical History: Last updated: 12/13/2006 HYSTERECTOMY, HX OF (ICD-V45.77) PSH reviewed for relevance, FH reviewed for relevance  Review of Systems  The patient denies anorexia, fever, weight loss, weight gain, chest pain, syncope, dyspnea on exertion, hemoptysis, severe indigestion/heartburn, incontinence, muscle weakness, difficulty walking, and enlarged lymph nodes.    Physical Exam  General:  WNWD AA female in no distress. Head:  Normocephalic and atraumatic without obvious abnormalities. No apparent alopecia or balding. Lungs:  normal respiratory effort, no intercostal retractions, no accessory muscle use, normal breath sounds, no crackles, and no wheezes.   Heart:  normal rate and regular rhythm.  Feint I/VI murmur at LSB.  Neurologic:  alert & oriented X3, cranial nerves II-XII intact, strength normal  in all extremities, and gait normal.     Impression & Recommendations:  Problem # 1:  COUGH (ICD-786.2) No evidence of on-going infection. suspect irritible cough.  Plan - tessalon perle 100mg  three times a day x 10 days.           Claritin daily  Problem # 2:  HYPERTENSION, ESSENTIAL NOS (ICD-401.9)  The following medications were removed from the medication list:    Triamterene-hctz 37.5-25 Mg Tabs (Triamterene-hctz) .[name]... 1/2 tab once daily Her updated medication list for this problem  includes:    Furosemide 40 Mg Tabs (Furosemide) ..... Once daily    Amlodipine Besy-benazepril Hcl 10-20 Mg Caps (Amlodipine besy-benazepril hcl) .[name]... 1 by mouth once daily    Metoprolol Succinate 100 Mg Xr24h-tab (Metoprolol succinate) .[name]... 1 by mouth once daily for blood pressure  BP today: 150/84 Prior BP: 180/92 (04/03/2009)  Prior 10 Yr Risk Heart Disease: 20 % (04/30/2008)  Plan - stop triameteren/Hct.           increase toprol XR - heart rate of 73 allows for increase.           Monitor BP and report back.   Complete Medication List: 1)  Estradiol 0.5 Mg Tabs (Estradiol) .... Take 1 tablet by mouth once a day 2)  Furosemide 40 Mg Tabs (Furosemide) .... Once daily 3)  Amlodipine Besy-benazepril Hcl 10-20 Mg Caps (Amlodipine besy-benazepril hcl) .[name].. 1 by mouth once daily 4)  Vytorin 10-20 Mg Tabs (Ezetimibe-simvastatin) .... Q pm 5)  Metformin Hcl 1000 Mg Tabs (Metformin hcl) .... Two times a day 6)  Contour Usb Blood Glucose Sys W/device Kit (Blood glucose monitoring suppl) .... Use two times a day as directed 7)  Bayer Contour Test Strp (Glucose blood) .... Use two times a day as directed 8)  Metoprolol Succinate 100 Mg Xr24h-tab (Metoprolol succinate) .[name].. 1 by mouth once daily for blood pressure 9)  Cefuroxime Axetil 250 Mg Tabs (Cefuroxime axetil) .[name].. 1 tab two times a day 10)  Benzonatate 100 Mg Caps (Benzonatate) .[name].. 1 by mouth three times a day  Patient Instructions: 1)  cough - no evidence today of any persistent infection. Suspect an irritative cough +/- allergy. Plan - take benzonatate 100mg  three times a day x 10 days (1 refill if needed) and take claritin (generic loratadine) 10mg  once a day. 2)  Blood pressure - not well controlled. Plan - stop triamterence/Hct 1/2 tab daily recently started. Continue taking amlodipine/benzaepril 10/20 once a day, continue taking furosemide 40mg  daily and increase metoprolol XR to 100mg  daily ( may take 2 x 50mg  until gone). Check  BP at [facility] and report back to me so that I know that your are controlled.  Prescriptions: METOPROLOL SUCCINATE 100 MG XR24H-TAB (METOPROLOL SUCCINATE) 1 by mouth once daily for blood pressure  #30 x 12   Entered and Authorized by:   Neena Rhymes MD   Signed by:   Neena Rhymes MD on 04/21/2009   Method used:   [facility]ron[facility] to        [facility] [address] [phone]* (retail)       [place] [address]       [place], [place]  [address]       Ph: [phone] or [phone]       Fax: [phone]   RxID:   [id number] BENZONATATE 100 MG CAPS (BENZONATATE) 1 by mouth three times a day  #30 x 1  Entered and Authorized by:   Neena Rhymes MD   Signed by:   Neena Rhymes MD on 04/21/2009   Method used:   Electronically to        [facility] [address] [phone]* (retail)       [address]       [place], [place]  [address]       Ph: [phone] or [phone]       Fax: [phone]   RxID:   [id number]

## 2010-02-23 NOTE — Progress Notes (Signed)
Summary: RX request  Phone Note Call from Patient Call back at Home Phone [phone]   Caller: Patient Summary of Call: pt came into clinic requesting a sample of Advair 100 that was given as a sample last OV. pt stated that she was not shown how to use it and when she figured it out she was already out of medication. I was advised per MD to send in an RX to pt pharmacy, [facility]. pt advised Initial call taken by: Crissie Sickles, CMA,  April 06, 2009 10:00 AM    New/Updated Medications: ADVAIR DISKUS 100-50 MCG/DOSE AEPB (FLUTICASONE-SALMETEROL) 1 puff two times a day Prescriptions: ADVAIR DISKUS 100-50 MCG/DOSE AEPB (FLUTICASONE-SALMETEROL) 1 puff two times a day  #1 x 1   Entered by:   Crissie Sickles, CMA   Authorized by:   Donavan Foil MD   Signed by:   Crissie Sickles, CMA on 04/06/2009   Method used:   [facility]ronically to        [facility] [address] [phone]* (retail)       [address]       [place], [place]  [address]       Ph: [phone] or [phone]       Fax: [phone]   RxID:   [id number]

## 2010-02-23 NOTE — Progress Notes (Signed)
Summary: Home BP check  Phone Note Call from Patient   Summary of Call: Pt was advised to call and give BP reading at [facility]. This am 202/104. Would you like pt to come in today for BP check?  Initial call taken by: Charlsie Quest, [place],  May 01, 2009 12:12 PM  Follow-up for Phone Call        by chart pt has hx of hard to control BP;  if has headache, confusion , chest pain , blood in the urine or other symptoms, he should go to ER now  if no symptoms, can make nurse OV for BP check to see if spontaneously improved , to cont same meds for now;  and f/u primary MD next avaiable  Follow-up by: Biagio Borg MD,  May 01, 2009 1:22 PM  Additional Follow-up for Phone Call Additional follow up Details #1::        pt informed and transferred toschedule BP check and f/u with [facility] Additional Follow-up by: Crissie Sickles, [place],  May 01, 2009 1:57 PM

## 2010-02-23 NOTE — Assessment & Plan Note (Signed)
Summary: 2 WK FU  STC   Vital Signs:  Patient profile:   60 year old female Height:      67 inches Weight:      156 pounds BMI:     24.52 O2 Sat:      98 % on Room air Temp:     98.1 degrees F oral Pulse rate:   61 / minute BP sitting:   152 / 88  (left arm) Cuff size:   regular  Vitals Entered By: Charlynne Cousins CMA (May 19, 2009 3:06 PM)  O2 Flow:  Room air CC: pt here for follow up office visit on BP/ ab   Primary Care Provider:  Neena Rhymes MD  CC:  pt here for follow up office visit on BP/ ab.  History of Present Illness: BP has been running high. She did see Dr. Loanne Drilling April 8th and had BP in the 160's She was taken off furosemide and started on triamterine/hct. NO headache, epistaxis. She is feeling well.   Patient admits that she continues to smoke. She has not yet started chantix.  Current Medications (verified): 1)  Estradiol 0.5 Mg Tabs (Estradiol) .... Take 1 Tablet By Mouth Once A Day 2)  Amlodipine Besy-Benazepril Hcl 10-20 Mg Caps (Amlodipine Besy-Benazepril Hcl) .[name].. 1 By Mouth Once Daily 3)  Vytorin 10-20 Mg  Tabs (Ezetimibe-Simvastatin) .... Q Pm 4)  Metformin Hcl 1000 Mg  Tabs (Metformin Hcl) .... Two Times A Day 5)  Contour Usb Blood Glucose Sys W/device Kit (Blood Glucose Monitoring Suppl) .... Use Two Times A Day As Directed 6)  Bayer Contour Test  Strp (Glucose Blood) .... Use Two Times A Day As Directed 7)  Metoprolol Succinate 100 Mg Xr24h-Tab (Metoprolol Succinate) .[name].. 1 By Mouth Once Daily For Blood Pressure 8)  Triamterene-Hctz 37.5-25 Mg Tabs (Triamterene-Hctz) .... 1/2 Tab Once Daily  Allergies (verified): No Known Drug Allergies PMH-FH-SH reviewed-no changes except otherwise noted  Review of Systems  The patient denies anorexia, fever, weight loss, weight gain, chest pain, syncope, dyspnea on exertion, prolonged cough, abdominal pain, depression, and enlarged lymph nodes.    Physical Exam  General:  WNWD AA femlae in no  distress Head:  normocephalic and atraumatic.   Eyes:  corneas and lenses clear and no injection.   Lungs:  normal respiratory effort and normal breath sounds.   Heart:  normal rate and regular rhythm.     Impression & Recommendations:  Problem # 1:  HYPERTENSION, ESSENTIAL NOS (ICD-401.9)  Her updated medication list for this problem includes:    Amlodipine Besy-benazepril Hcl 10-20 Mg Caps (Amlodipine besy-benazepril hcl) .[name]... 1 by mouth once daily    Metoprolol Succinate 100 Mg Xr24h-tab (Metoprolol succinate) .[name]... 1 by mouth once daily for blood pressure    Triamterene-hctz 37.5-25 Mg Tabs (Triamterene-hctz) .[name]... 1/2 tab once daily  BP today: 152/88 Prior BP: 160/80 (05/01/2009)  Prior 10 Yr Risk Heart Disease: 20 % (04/30/2008)  Patient is suboptimally controlled but is on a 5 drug regimen. She is asymptomatic.  Plan - continue present medications. Obtain BP readings when not at the doctors office.  Orders: Tobacco use cessation intermediate 3-10 minutes [id number])  Problem # 2:  TOBACCO ABUSE (ICD-305.1)  Patient continues to smoke. Discussed with her the importance of smoking cessation, more important than tight BP control.  Plan - she has promised to call 1-800-quit-now  Orders: Tobacco use cessation intermediate 3-10 minutes [id number])  Complete Medication List: 1)  Estradiol 0.5 Mg  Tabs (Estradiol) .... Take 1 tablet by mouth once a day 2)  Amlodipine Besy-benazepril Hcl 10-20 Mg Caps (Amlodipine besy-benazepril hcl) .[name].. 1 by mouth once daily 3)  Vytorin 10-20 Mg Tabs (Ezetimibe-simvastatin) .... Q pm 4)  Metformin Hcl 1000 Mg Tabs (Metformin hcl) .... Two times a day 5)  Contour Usb Blood Glucose Sys W/device Kit (Blood glucose monitoring suppl) .... Use two times a day as directed 6)  Bayer Contour Test Strp (Glucose blood) .... Use two times a day as directed 7)  Metoprolol Succinate 100 Mg Xr24h-tab (Metoprolol succinate) .[name].. 1 by mouth once daily for blood  pressure 8)  Triamterene-hctz 37.5-25 Mg Tabs (Triamterene-hctz) .... 1/2 tab once daily

## 2010-03-30 ENCOUNTER — Other Ambulatory Visit: Payer: BC Managed Care – PPO

## 2010-03-30 ENCOUNTER — Ambulatory Visit (INDEPENDENT_AMBULATORY_CARE_PROVIDER_SITE_OTHER): Payer: BC Managed Care – PPO | Admitting: Internal Medicine

## 2010-03-30 ENCOUNTER — Encounter: Payer: Self-pay | Admitting: Internal Medicine

## 2010-03-30 ENCOUNTER — Other Ambulatory Visit: Payer: Self-pay | Admitting: Internal Medicine

## 2010-03-30 DIAGNOSIS — I10 Essential (primary) hypertension: Secondary | ICD-10-CM

## 2010-03-30 DIAGNOSIS — F17200 Nicotine dependence, unspecified, uncomplicated: Secondary | ICD-10-CM

## 2010-03-30 DIAGNOSIS — E119 Type 2 diabetes mellitus without complications: Secondary | ICD-10-CM

## 2010-03-30 DIAGNOSIS — E785 Hyperlipidemia, unspecified: Secondary | ICD-10-CM

## 2010-03-30 LAB — BASIC METABOLIC PANEL
BUN: 11 mg/dL (ref 6–23)
CO2: 30 mEq/L (ref 19–32)
Calcium: 9.5 mg/dL (ref 8.4–10.5)
Chloride: 103 mEq/L (ref 96–112)
Creatinine, Ser: 0.8 mg/dL (ref 0.4–1.2)
GFR: 92.7 mL/min (ref >60.00)
Glucose, Bld: 99 mg/dL (ref 70–99)
Potassium: 4.1 mEq/L (ref 3.5–5.1)
Sodium: 139 mEq/L (ref 135–145)

## 2010-03-30 LAB — HEMOGLOBIN A1C: Hgb A1c MFr Bld: 6.9 % — ABNORMAL HIGH (ref 4.6–6.5)

## 2010-03-31 ENCOUNTER — Encounter: Payer: Self-pay | Admitting: Internal Medicine

## 2010-04-02 ENCOUNTER — Other Ambulatory Visit: Payer: Self-pay | Admitting: Internal Medicine

## 2010-04-02 DIAGNOSIS — I701 Atherosclerosis of renal artery: Secondary | ICD-10-CM

## 2010-04-02 DIAGNOSIS — I10 Essential (primary) hypertension: Secondary | ICD-10-CM

## 2010-04-06 ENCOUNTER — Ambulatory Visit (INDEPENDENT_AMBULATORY_CARE_PROVIDER_SITE_OTHER)
Admission: RE | Admit: 2010-04-06 | Discharge: 2010-04-06 | Disposition: A | Discharge status: Home / Self Care | Payer: BC Managed Care – PPO | Source: Ambulatory Visit | Attending: Internal Medicine | Admitting: Internal Medicine

## 2010-04-06 DIAGNOSIS — I701 Atherosclerosis of renal artery: Secondary | ICD-10-CM

## 2010-04-06 DIAGNOSIS — I10 Essential (primary) hypertension: Secondary | ICD-10-CM

## 2010-04-06 HISTORY — DX: Essential (primary) hypertension: I10

## 2010-04-06 MED ORDER — IOHEXOL 350 MG/ML SOLN
100.0000 mL | Freq: Once | INTRAVENOUS | Status: AC | PRN
  Administered 2010-04-06: 100 mL via INTRAVENOUS

## 2010-04-06 NOTE — Assessment & Plan Note (Signed)
Summary: PER PT REGULAR OV--D/T---STC   Vital Signs:  Patient profile:   60 year old female Height:      67 inches Weight:      154 pounds BMI:     24.21 Temp:     99.1 degrees F oral Pulse rate:   88 / minute Pulse rhythm:   regular Resp:     16 per minute BP sitting:   180 / 90  (left arm) Cuff size:   regular  Vitals Entered By: Jonathon Resides, Gabrielle Dare) (March 30, 2010 3:37 PM) CC: f/u c/o elevated BP, HA and lightheaded, Hypertension Management Is Patient Diabetic? Yes   Primary Care Provider:  Neena Rhymes MD  CC:  f/u c/o elevated BP, HA and lightheaded, and Hypertension Management.  History of Present Illness: [name] presents due to a jump in her blood pressure with readings at 200. She has had some mild light-headed feelings and she has had minor HA. No epistaxis, some blurred vision, no chest pain or discomfort. She does have high salt foods in her diet.   Hypertension History:      She denies headache, chest pain, palpitations, orthopnea, peripheral edema, visual symptoms, neurologic problems, syncope, and side effects from treatment.        Positive major cardiovascular risk factors include female age 36 years old or older, diabetes, hyperlipidemia, hypertension, and current tobacco user.  Negative major cardiovascular risk factors include negative family history for ischemic heart disease.        Positive history for target organ damage include cardiac end organ damage (either CHF or LVH).  Further assessment for target organ damage reveals no history of ASHD, stroke/TIA, peripheral vascular disease, renal insufficiency, or hypertensive retinopathy.     Current Medications (verified): 1)  Estradiol 0.5 Mg Tabs (Estradiol) .... Take 1 Tablet By Mouth Once A Day 2)  Amlodipine Besy-Benazepril Hcl 10-20 Mg Caps (Amlodipine Besy-Benazepril Hcl) .[name].. 1 By Mouth Once Daily 3)  Vytorin 10-20 Mg  Tabs (Ezetimibe-Simvastatin) .... Q Pm 4)  Metformin Hcl 1000 Mg   Tabs (Metformin Hcl) .... Two Times A Day 5)  Contour Usb Blood Glucose Sys W/device Kit (Blood Glucose Monitoring Suppl) .... Use Two Times A Day As Directed 6)  Bayer Contour Test  Strp (Glucose Blood) .... Use Two Times A Day As Directed 7)  Metoprolol Succinate 100 Mg Xr24h-Tab (Metoprolol Succinate) .[name].. 1 By Mouth Once Daily For Blood Pressure 8)  Triamterene-Hctz 37.5-25 Mg Tabs (Triamterene-Hctz) .[name].. 1 Tablet Daily  Allergies (verified): No Known Drug Allergies  Past History:  Past Medical History: Last updated: 04/11/2008 Hx of SEBACEOUS CYST, NECK (ICD-706.2) HYPERTENSION, ESSENTIAL NOS (ICD-401.9) with LVH on ECG TOBACCO ABUSE (ICD-305.1) COUGH (ICD-786.2) HYPERTHYROIDISM (ICD-242.90) HYPERLIPIDEMIA (ICD-272.4) DIABETES MELLITUS, TYPE II (ICD-250.00)    Past Surgical History: Last updated: 12/13/2006 HYSTERECTOMY, HX OF (ICD-V45.77)  Family History: Last updated: 12-Sep-2008 Father-deceased @ 17: died of lung cancer, mother - deceased 75:DM, HTN sleep apnea  Neg - breast or colon cancer  Social History: Last updated: 09/12/08 11th grade married '84-13 yrs/divorce 1 son - '68;  Sexually active - using condoms 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    Physical Exam  General:  Well-developed,well-nourished,in no acute distress; alert,appropriate and cooperative throughout examination Head:  Normocephalic and atraumatic without obvious abnormalities.  No apparent alopecia or balding. Eyes:  PERRLA, Fundi - cooper wiring, AV nicking, no exudate or hemorrhage. Lungs:  normal respiratory effort.   Heart:  normal rate, regular rhythm, no murmur, and no JVD.     Impression & Recommendations:  Problem # 1:  HYPERTENSION,  ESSENTIAL NOS (ICD-401.9) Very poor control on 4 medications with progressive elevation. She has multiple risk factors for ASVD related RAS as contributing factor to HTN.  Plan - continue present medication with a change to furosemide for diuretic           CT angio renal arteries to r/o RAS  Her updated medication list for this problem includes:    Amlodipine Besy-benazepril Hcl 10-20 Mg Caps (Amlodipine besy-benazepril hcl) .[name]... 1 by mouth once daily    Metoprolol Succinate 100 Mg Xr24h-tab (Metoprolol succinate) .[name]... 1 by mouth once daily for blood pressure    Furosemide 40 Mg Tabs (Furosemide) .[name]... 1 by mouth once daily  Orders: Radiology Referral (Radiology) TLB-BMP (Basic Metabolic Panel-BMET) ([id number])  Problem # 2:  TOBACCO ABUSE (ICD-305.1)  Patient is strongly, if not adamantly encouraged to quit smoking especially in light of her many risk factors.  Orders: Tobacco use cessation intermediate 3-10 minutes [id number]  Problem # 3:  HYPERLIPIDEMIA (ICD-272.4)  Her updated medication list for this problem includes:    Vytorin 10-20 Mg Tabs (Ezetimibe-simvastatin) ..... Q pm  Labs Reviewed: SGOT: 20 (09/04/2009)   SGPT: 17 (09/04/2009)  10 Yr Risk Heart Disease: > 32 % Prior 10 Yr Risk Heart Disease: 20 % (04/30/2008)   HDL:51.50 (09/04/2009), 46.70 (09/04/2008)  LDL:103 (09/04/2009), 84 (09/04/2008)  Chol:171 (09/04/2009), 160 (09/04/2008)  Trig:82.0 (09/04/2009), 146.0 (09/04/2008)  Will continue prsent medications. She will be due for lipid panel in late sring or summer. She is encouraged to be more adherent to a low salt and LOW FAT diet.   Problem # 4:  DIABETES MELLITUS, TYPE II (ICD-250.00) For A1C today with recommendations to follow  Her updated medication list for this problem includes:    Amlodipine Besy-benazepril Hcl 10-20 Mg Caps (Amlodipine besy-benazepril hcl) .[name]... 1 by mouth once daily    Metformin Hcl 1000 Mg Tabs (Metformin hcl) .[name]..[name] Two times  a day  Orders: TLB-A1C / Hgb A1C (Glycohemoglobin) (83036-A1C)  Addendum - A1C confirms good control.  Complete Medication List: 1)  Estradiol 0.5 Mg Tabs (Estradiol) .... Take 1 tablet by mouth once a day 2)  Amlodipine Besy-benazepril Hcl 10-20 Mg Caps (Amlodipine besy-benazepril hcl) .[name].. 1 by mouth once daily 3)  Vytorin 10-20 Mg Tabs (Ezetimibe-simvastatin) .... Q pm 4)  Metformin Hcl 1000 Mg Tabs (Metformin hcl) .... Two times a day 5)  Contour Usb Blood Glucose Sys W/device Kit (Blood glucose monitoring suppl) .... Use two times a day as directed 6)  Bayer Contour Test Strp (Glucose blood) .... Use two times a day as directed 7)  Metoprolol Succinate 100 Mg Xr24h-tab (Metoprolol succinate) .[name].. 1 by mouth once daily for blood pressure 8)  Furosemide 40 Mg Tabs (Furosemide) .[name].. 1 by mouth once daily  Hypertension Assessment/Plan:      The patient's hypertensive risk group is category C: Target organ damage and/or diabetes.  Her calculated 10 year risk of coronary heart disease is > 32 %.  Today's blood pressure is 180/90.  Her blood pressure goal is < 130/80.  Patient: [name] Note: All result statuses are Final unless otherwise noted.  Tests: (1) Hemoglobin A1C (A1C)   Hemoglobin A1C       [  H]  6.9 %                       4.6-6.5     Glycemic Control Guidelines for People with Diabetes:     Non Diabetic:  <6%     Goal of Therapy: <7%     Additional Action Suggested:  >8%   Tests: (2) BMP (METABOL)   Sodium                    139 mEq/L                   135-145   Potassium                 4.1 mEq/L                   3.5-5.1   Chloride                  103 mEq/L                   96-112   Carbon Dioxide            30 mEq/L                    19-32   Glucose                   99 mg/dL                    70-99   BUN                       11 mg/dL                    6-23   Creatinine                0.8 mg/dL                   0.4-1.2   Calcium                   9.5  mg/dL                   8.4-10.5   GFR                       92.70 mL/min                >60.00   Patient Instructions: 1)  Blood pressure - poor control. Plan - stop  Triam/Hctz; start furosemide 40mg  once a day; lab today to check kidney function and potassium; CT angiogram to rule out renal artery stenosis - my staff will call you with an appointment time when we get permission from the insurance company.  Prescriptions: FUROSEMIDE 40 MG TABS (FUROSEMIDE) 1 by mouth once daily  #30 x 12   Entered and Authorized by:   Neena Rhymes MD   Signed by:   Neena Rhymes MD on 03/30/2010   Method used:   [facility] to        [facility] [address] [phone]* (retail)       [address]       [place], [place]  [address]       Ph: [phone] or [phone]  Fax: [phone]   [id number]    Orders Added: 1)  Radiology Referral [Radiology] 2)  TLB-A1C / Hgb A1C (Glycohemoglobin) [83036-A1C] 3)  TLB-BMP (Basic Metabolic Panel-BMET) [id number] 4)  Est. Patient Level IV [id number] 5)  Tobacco use cessation intermediate 3-10 minutes [id number]

## 2010-04-06 NOTE — Letter (Signed)
   [facility] [address], [place]  [address] Phone: [phone]      April 01, 2010   [place], [place] [address]  RE:  LAB RESULTS  Dear  Ms. CADWELL,  The following is an interpretation of your most recent lab tests.  Please take note of any instructions provided or changes to medications that have resulted from your lab work.  ELECTROLYTES:  Good - no changes needed  KIDNEY FUNCTION TESTS:  Good - no changes needed    DIABETIC STUDIES:  Excellent - no changes needed Blood Glucose: 99   HgbA1C: 6.9   Microalbumin/Creatinine Ratio: 679.6     Please come see me if you have any questions about these lab results.   Sincerely Yours,    Neena Rhymes MD

## 2010-05-01 LAB — GLUCOSE, CAPILLARY
Glucose-Capillary: 166 mg/dL — ABNORMAL HIGH (ref 70–99)
Glucose-Capillary: 75 mg/dL (ref 70–99)

## 2010-05-28 ENCOUNTER — Encounter: Payer: Self-pay | Admitting: Internal Medicine

## 2010-05-28 ENCOUNTER — Other Ambulatory Visit (INDEPENDENT_AMBULATORY_CARE_PROVIDER_SITE_OTHER): Payer: BC Managed Care – PPO

## 2010-05-28 ENCOUNTER — Ambulatory Visit (INDEPENDENT_AMBULATORY_CARE_PROVIDER_SITE_OTHER): Payer: BC Managed Care – PPO | Admitting: Internal Medicine

## 2010-05-28 DIAGNOSIS — R259 Unspecified abnormal involuntary movements: Secondary | ICD-10-CM

## 2010-05-28 DIAGNOSIS — R252 Cramp and spasm: Secondary | ICD-10-CM

## 2010-05-28 DIAGNOSIS — R251 Tremor, unspecified: Secondary | ICD-10-CM

## 2010-05-28 LAB — COMPREHENSIVE METABOLIC PANEL
ALT: 19 U/L (ref 0–35)
AST: 22 U/L (ref 0–37)
Albumin: 3.9 g/dL (ref 3.5–5.2)
Alkaline Phosphatase: 61 U/L (ref 39–117)
BUN: 8 mg/dL (ref 6–23)
CO2: 27 mEq/L (ref 19–32)
Calcium: 8.9 mg/dL (ref 8.4–10.5)
Chloride: 101 mEq/L (ref 96–112)
Creatinine, Ser: 0.7 mg/dL (ref 0.4–1.2)
GFR: 107.87 mL/min (ref >60.00)
Glucose, Bld: 124 mg/dL — ABNORMAL HIGH (ref 70–99)
Potassium: 3.6 mEq/L (ref 3.5–5.1)
Sodium: 140 mEq/L (ref 135–145)
Total Bilirubin: 1 mg/dL (ref 0.3–1.2)
Total Protein: 7.1 g/dL (ref 6.0–8.3)

## 2010-05-28 LAB — CBC WITH DIFFERENTIAL/PLATELET
Basophils Absolute: 0 10*3/uL (ref 0.0–0.1)
Basophils Relative: 0.7 % (ref 0.0–3.0)
Eosinophils Absolute: 0.1 10*3/uL (ref 0.0–0.7)
Eosinophils Relative: 1.8 % (ref 0.0–5.0)
HCT: 38.8 % (ref 36.0–46.0)
Hemoglobin: 13.2 g/dL (ref 12.0–15.0)
Lymphocytes Relative: 26.5 % (ref 12.0–46.0)
Lymphs Abs: 1.7 10*3/uL (ref 0.7–4.0)
MCHC: 34.1 g/dL (ref 30.0–36.0)
MCV: 98.9 fl (ref 78.0–100.0)
Monocytes Absolute: 0.5 10*3/uL (ref 0.1–1.0)
Monocytes Relative: 7.6 % (ref 3.0–12.0)
Neutro Abs: 4 10*3/uL (ref 1.4–7.7)
Neutrophils Relative %: 63.4 % (ref 43.0–77.0)
Platelets: 210 10*3/uL (ref 150.0–400.0)
RBC: 3.92 Mil/uL (ref 3.87–5.11)
RDW: 13.1 % (ref 11.5–14.6)
WBC: 6.4 10*3/uL (ref 4.5–10.5)

## 2010-05-28 LAB — TSH: TSH: 1.07 u[IU]/mL (ref 0.35–5.50)

## 2010-05-28 LAB — MAGNESIUM: Magnesium: 1.6 mg/dL (ref 1.5–2.5)

## 2010-05-28 NOTE — Progress Notes (Signed)
  Subjective:    Patient ID: [name], female    DOB: [birth year], 60 y.o.   MRN: [id number]  HPI Patient present for evaluation of a 24 hr episode of tremor, leg cramps and weakness. He has had no fever, chills, change in bowel habits, respiratory symptoms or chest pain. She has not had any focal weakness or paresthesia. No travel, no new foods, no sick contact.   PMH, FamHx and SocHx reviewed for any changes and relevance.    Review of Systems Review of Systems  Constitutional:  Negative for fever, chills, activity change and unexpected weight change.  HENT:  Negative for hearing loss, ear pain, congestion, neck stiffness and postnasal drip.   Eyes: Negative for pain, discharge and visual disturbance.  Respiratory: Negative for chest tightness and wheezing.   Cardiovascular: Negative for chest pain and palpitations.       [No decreased exercise tolerance Gastrointestinal: [No change in bowel habit. No bloating or gas. No reflux or indigestion Genitourinary: Negative for urgency, frequency, flank pain and difficulty urinating.  Musculoskeletal: Negative for myalgias, back pain, arthralgias and gait problem.  Neurological: Negative for dizziness,headaches. Positive for fine tremor and weakness Hematological: Negative for adenopathy.  Psychiatric/Behavioral: Negative for behavioral problems and dysphoric mood.       Objective:   Physical Exam WNWD AA woman in no distress HEENT- nl Neck - supple, no thyromegaly Lungs - clear Cor - RRR, I-II/VI murmur best at LSB Abd - soft, BS +, no G/R Neuro - A&O x 3, CN II-XII normal, DTRs normal at biceps, radial and patellar tendons, nl rapid alternating finger movement, no dysdiadochokinesia, nol Heel-shin maneuver.      Assessment & Plan:  1. Leg cramps - new on-set. No predisposing events.   Plan- lab to r/o low potassium or magnesium  2. Tremor/shakiness - normal physical exam.  Plan - lab to r/o anemia, electrolyte  derangement, thyroid abnormality  3. Bone health - advised patient to take calcium and vit D - see patient instructions.

## 2010-05-28 NOTE — Patient Instructions (Signed)
Leg cramps and tremors - exam is normal. Will check lab to be sure potassium and magnesium are normal, that kidney function is normal, that blood counts are normal without anemia and to be sure thyroid function is normal. If there is something wrong you will hear soon, otherwise you will get a letter with the results. In the meantime be sure to eat healthy and stay hydrated.  Bone health - you need 1200 mg a day of calcium from diet and supplement combined: so you should take tums or calcium tablets for 1000 mg daily; you need to take 800 international units a day of vit D, available as over the counter supplements.  Stroke prevention - take 81 mg of aspirin daily. Leg Cramps Leg cramps that occur during exercise can be caused by poor circulation. However, muscle cramps that occur at rest or during the night are usually not due to any serious medical problem. Heat cramps may cause muscle spasms during hot weather.   CAUSE There is no clear cause for muscle cramps. However, dehydration may be a factor for those who exercise in the heat. Imbalances in the level of sodium, potassium, calcium or magnesium in the muscle tissue may also be a factor. TREATMENT  Make sure your diet has enough fluids and essential minerals for the muscle to work normally.   Avoid strenuous exercise for several days if you have been having frequent leg cramps.   Stretch and massage the cramped muscle for several minutes.   Some medicines may be helpful in some patients with night cramps. Only take over-the-counter or prescription medicines as directed by your caregiver.  SEEK IMMEDIATE MEDICAL CARE IF:  Your leg cramps become worse.   Your foot becomes cold, numb or blue.  Document Released: 02/18/2004 Document Re-Released: 04/08/2008 [facility] Patient Information 2011 [place].Bone Health Our bones do many things. They provide structure, protect organs, anchor muscles, and store calcium. Adequate calcium in your  diet and weight-bearing physical activity help build strong bones, improve bone amounts, and may reduce the risk of weakening of bones (osteoporosis) later in life. PEAK BONE MASS By age 94, the average woman has acquired most of her skeletal bone mass. A large decline occurs in older adults which increases the risk of osteoporosis. In women this occurs around the time of menopause. It is important for young girls to reach their peak bone mass in order to maintain bone health throughout life. A person with high bone mass as a young adult will be more likely to have a higher bone mass later in life. Not enough calcium consumption and physical activity early on could result in a failure to achieve optimum bone mass in adulthood. OSTEOPOROSIS Osteoporosis is a disease of the bones. It is defined as low bone mass with deterioration of bone structure. Osteoporosis leads to an increase risk of fractures with falls. These fractures commonly happen in the wrist, hip, and spine. While men and women of all ages and background can develop osteoporosis, some of the risk factors for osteoporosis are:  Female.   White/Caucasian.   Post menopausal.   Older adults.   Small in body size.   Eating a diet low in calcium.   Physically inactive.   Smoking.   Use of some medications such as prednisone.   Family history.  CALCIUM Calcium is a mineral needed by the body for healthy bones, teeth, and proper function of the heart, muscles, and nerves. The body cannot produce calcium so it  must be absorbed through food. Good sources of calcium include:  Dairy products (low fat or nonfat milk, cheese, and yogurt).   Dark green leafy vegetables (bok choy and broccoli).   Calcium fortified foods (orange juice, cereal, bread, soy beverages, and tofu products).   Nuts (almonds).  Recommended amounts of calcium vary for individuals. Below is a guide for how much calcium you should take in daily, as outlined by  the [employer]. RECOMMENDED CALCIUM INTAKES AGE AMOUNT MG/DAY Birth - 6 months . . . . 210 mg 6 months - 1 year . . . 270 mg 1 - 3 years . . . . . . . [name]500 mg 4 - 8 years . . . . . . . [name]800 mg 9 - 13 years . . . . . . .1300 mg 14 - 18 years . . . . . .1300 mg 19 - 30 years . . . . . [name]1000 mg 31 - 50 years . . . . . 1000 mg 51 - 70 years . . . . . 1200 mg 70 or older . . . . . . . . 1200 mg Pregnant & lactating . 1000 mg 14 - 18 years . . . . . . 1300 mg 19 - 50 years . . . . . . 1000 mg Source: Dietary Reference Intakes for Calcium, [employer], 2009 Vitamin D also plays an important role in healthy bone development. Vitamin D helps in the absorption of calcium (this is why milk is fortified with vitamin D). For more information on calcium and children visit the [place] (NICHD) Website at LemonLog.is. WEIGHT-BEARING PHYSICAL ACTIVITY Regular physical activity has many positive health benefits. Benefits include strong bones. Weight-bearing physical activity early in life is important in reaching peak bone mass. Weight-bearing physical activities cause muscles and bones to work against gravity. Some examples of weight bearing physical activities include:  Walking, jogging, or running.     [employer].     Jumping rope.     Dancing.    Soccer.    Tennis or Racquetball.     Stair climbing.     Basketball.    Hiking.    Weight lifting.     Aerobic fitness classes.     Including weight-bearing physical activity into an exercise plan is a great way to keep bones healthy and meet physical activity recommendations set forth in the Dietary Guidelines for Americans. Adults: Engage in at least 30 minutes of moderate physical activity on most, preferably all, days of the week. Children: Engage in at least 60 minutes of moderate physical activity on most, preferably all, days  of the week. REFERENCES For more information on the Dietary Guidelines for Americans please visit the Web site at: ForwardButton.com.br.htm For more information on bone health and osteoporosis please visit the [place] online at https://www.norman-romero.com/ Document Released: 04/02/2003 Document Re-Released: 06/30/2009 [facility] Patient Information 2011 [place].

## 2010-05-31 ENCOUNTER — Encounter: Payer: Self-pay | Admitting: Internal Medicine

## 2010-06-08 NOTE — Assessment & Plan Note (Signed)
[facility]                            CARDIOLOGY OFFICE NOTE   NAME:[name], [name]                    MRN:          [id number]  DATE:04/11/2008                            DOB:          [birth year]    This is a 60 year old patient referred by Dr. Ronnald Ramp for apparent chest  pain, murmur, and an abnormal EKG.  In talking to the patient, she  actually has significant back pain.  Her pain is under her right rib  area and towards the mid spine area that sounds like she has pulled a  muscle.  She has difficulty laying flat.  She has increased pain with  prolonged sitting in 1 position.  She works at [employer] and has some  pain, when she is walking and working on [employer].  In my mind, this  pain is clearly musculoskeletal.   She had an EKG done and was repeated here.  She appears to have sinus  rhythm with LVH and left axis deviation.   She apparently had a murmur as well.   In talking to the patient, she has not had previous exertional dyspnea,  PND, or orthopnea.  There has been no previous chest pain.  No previous  history of rheumatic fever or valvular heart disease.  She has not had a  previous cardiac workup back in 2000.  She did have an echocardiogram  that showed moderate LVH with MR and TR.   At that point, her septal thickness was 15 mm.   The patient is a smoker.  Coronary risk factors otherwise include  hypertension.   She has not tried nonsteroidals and Dr. Ronnald Ramp did not give her any  medication for her back pain.   PAST MEDICAL HISTORY:  Otherwise unremarkable.  She has not had previous  surgery.  There is a question of borderline diabetes and hypertension.   FAMILY HISTORY:  Remarkable for mother and father being deceased.  Mother had high blood pressure and diabetes.  Father had lung cancer.  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  She is fairly inactive.  She  smokes a pack a day.  She is divorced and has 1 child.   She  drinks on occasion and smokes pack a day.   MEDICATIONS:  1. Amlodipine 20 mg a day.  2. Metformin 1 g b.i.d.  3. Vytorin 10/20.  4. Lasix 40 a day.  5. Toprol 100 a day.  6. Estradiol.   No known allergies.   PHYSICAL EXAMINATION:  GENERAL:  Remarkable for black female and some  pain holding her back.  VITAL SIGNS:  Weight is 154, blood pressure is 150/80, pulse is 56 and  regular, respiratory rate 14, and afebrile.  HEENT:  Unremarkable.  NECK:  Carotids are normal without bruit.  No lymphadenopathy,  thyromegaly, or JVP elevation.  LUNGS:  Clear.  Good diaphragmatic motion.  No wheezing.  Pain to  palpation over the right posterior back area just above the iliac crest.  CARDIAC:  S1 and S2 with a soft systolic ejection murmur.  PMI is  normal.  ABDOMEN:  Benign.  Bowel sounds positive.  No AAA.  No tenderness.  No  bruit.  No hepatosplenomegaly.  No hepatojugular reflux.  EXTREMITIES:  Distal pulses are intact.  No edema.  NEUROLOGIC:  Nonfocal.  SKIN:  Warm and dry.  No muscular weakness.   EKG showing sinus bradycardia with left axis deviation and LVH.   IMPRESSION:  1. Atypical chest and back pain.  To me, this is clearly      musculoskeletal.  I will give her some Percocet and Flexeril.  She      needs to see Dr. Linda Hedges next week for further workup of this.  In      light of her multiple coronary risk factors including      hypercholesterolemia, hypertension and diabetes, we will do a      followup dobutamine echo.  2. Previous history of left ventricular hypertrophy.  This is probably      represented in her EKG.  We will check her septal thickness,      previous valvular disease, and rule out coronary artery disease,      all with a dobutamine echo.  3. Hypertension.  Continue current therapy, seems well controlled.  4. Diabetes.  Follow up with Dr. Linda Hedges.  Continue metformin.      Hemoglobin A1c quarterly.  5. Hyperlipidemia.  Continue Vytorin 10/20.  Lipid and  liver profile      per Dr. Linda Hedges.   As long as the patient's dobutamine echo is normal, she will be seen on  an as-needed basis.  Hopefully, the Percocet and Flexeril will help with  her back pain.     Wallis Bamberg. Johnsie Cancel, MD, [facility]  Electronically Signed    PCN/MedQ  DD: 04/11/2008  DT: 04/12/2008  Job #: [id number]   cc:   Heinz Knuckles. Norins, MD  Scarlette Calico, MD

## 2010-06-11 NOTE — Assessment & Plan Note (Signed)
[facility]                           PRIMARY CARE OFFICE NOTE   NAME:WILLIAMSMallak, Rapier                    MRN:          [id number]  DATE:05/02/2006                            DOB:          11/13/1950    Ms. Robbs was seen today for a full physical examination.  During  that evaluation, she was noted to have a lipid level with an LDL of 176.   Patient's examination is previously documented.   Lipids:  Discussed this at length with the patient in regards to the  importance of controlling all potential risk factors in a diabetic.  Explained her 18-fold increased risk of heart disease because of her  diabetes.  Stressed the importance of risk reduction.  Adamantly  encouraged the patient to resume taking Vytorin for control of her  lipids.  She has agreed.   PLAN:  Have the patient to return for follow-up lipid panel in one  month.     Heinz Knuckles Norins, MD  Electronically Signed    MEN/MedQ  DD: 05/02/2006  DT: 05/03/2006  Job #: [id number]

## 2010-06-11 NOTE — Assessment & Plan Note (Signed)
[facility]                           PRIMARY CARE OFFICE NOTE   NAME:WILLIAMSImona, [name]                    MRN:          [id number]  DATE:05/02/2006                            DOB:          12-04-1950    [name] is a 60 year old, African-American woman followed for  hypertension  who presents for a followup evaluation and exam. She was  last seen in the office February 21, 2006. She was noted to have a 5-mm  nodule in the left lung on CT scan February 24, 2006. This was a followup  study from a previous examination October 26, 2005 and there was no  change. The patient also had a cyst at the right posterior neck. She was  referred to Dr. Georgette Dover for drainage which was performed in the office  February 6, [id number] without complications.   The patient reports she is feeling okay and has no specific complaints.  On questioning, she does admit to not taking her antihyperlipemic  medication.   PAST MEDICAL HISTORY:   SURGICAL:  Hysterectomy.   MEDICAL:  1. Usual childhood diseases.  2. Hypertension.  3. NIDDM.  4. Hyperlipidemia.   FAMILY HISTORY:  Father died of lung cancer, mother is currently in the  hospital with respiratory failure on a trach at [place]. There is no  history of breast cancer or colon cancer.   SOCIAL HISTORY:  The patient was married but now divorced. She lives  singly. She works for [employer].   CURRENT MEDICATIONS:  1. Estratest daily.  2. Toprol XL 100 mg daily.  3. Lasix 40 mg daily.  4. Vytorin 10/20 daily but not taking it.  5. Metformin 1 gram b.i.d.  6. Lotrel 10/20 daily.   REVIEW OF SYSTEMS:  Negative for any constitutional, cardiovascular,  respiratory, GI or GU problems.   PHYSICAL EXAMINATION:  VITAL SIGNS:  Temperature was 98.5, blood  pressure was 168/87, pulse was 70, weight 157.  GENERAL:  This is a well-nourished, well-developed, African-American  woman in no acute distress.  HEENT:  Normocephalic,  atraumatic. EACs and TMs were unremarkable.  Oropharynx with native dentition in good repair. No buccal or palatal  lesions were noted. She does have partial dentures. Conjunctiva and  sclera was clear. Pupils equal, round and reactive. Question of early  cataract in the right eye. Funduscopic exam revealed no disk  abnormalities and no vascular abnormalities.  NECK:  Supple without thyromegaly.  NODES:  No adenopathy was noted in the cervical or supraclavicular  regions.  CHEST:  CVA tenderness.  LUNGS:  Clear to auscultation and percussion.  CARDIOVASCULAR:  2+ radial pulses, no JVD or carotid bruits. She had a  quiet precordium with a regular rate and rhythm without murmurs, rubs or  gallops.  BREASTS:  Skin was normal, nipples without discharge. No fixed mass  lesion or abnormality was noted. There is no axillary adenopathy.  ABDOMEN:  Soft, no guarding or rebound. No organosplenomegaly was  appreciated.  PELVIC:  Deferred the patient being status post hysterectomy.  EXTREMITIES:  Without clubbing, cyanosis, edema or deformity.  NEUROLOGIC:  Nonfocal.  SKIN:  Clear.   CHART REVIEW:  The last colonoscopy was August 27, 2003 with patient  followup in 2010. The patient had a 2-D echo August 06, 1998 which showed  normal LV function, moderate LVH, trace MR, trace TR. The last CT of the  February 24, 2006 is noted with stable 5-mm left upper lobe nodule, no  adenopathy was seen. The last mammogram was March 23, 2005 and was a  negative study.   LABORATORY DATA:  Hemoglobin was 13.6 grams, white count was 6000 with a  normal differential. Platelet count was 268,000, chemistries were  unremarkable except for a glucose of 144. Liver functions were normal,  kidney function normal with a creatinine of 0.7. Cholesterol 242,  triglycerides 176, HDL 44, LDL 176. TSH was normal at 2.23. Urinalysis  revealed glucose, hemoglobin A1c was 7%.   ASSESSMENT/PLAN:  1. Hypertension. The patient is  not well controlled on her present      multidrug regimen. I would like the patient to have blood pressure      checks outside the office to confirm that she is running high and      if so will make appropriate adjustments in her medication.  2. Lipids. The patient has not been taking her medication. See      additional note.  3. Diabetes. The patient is at goal with a hemoglobin A1c of 7%. She      will continue on her present medications.  4. Health maintenance. The patient is current and up-to-date with      colorectal cancer screening and mammography.   In summary, a pleasant patient who seems to be medically stable except  for lipids, see next note.     Heinz Knuckles Norins, MD  Electronically Signed    MEN/MedQ  DD: 05/02/2006  DT: 05/03/2006  Job #: [id number]

## 2010-06-11 NOTE — Letter (Signed)
February 21, 2006     RE:  [name], [name]  MRN:  [id number]  /  DOB:  Jun 20, 1950   Imogene Burn. Georgette Dover, M.D.  [address]  [place] [address]   Dear Dr. Georgette Dover:   Thank you very much for seeing [name], a very pleasant 60-  year-old African-American woman for a cyst in her right posterior neck.   Ms. Fix was seen in the office today.  She reports the cyst on the  back of her neck has been growing over the past several weeks.  It is  uncomfortable and somewhat pruritic.  She has not manipulated it nor had  any drainage from it.   On examination today the patient has a 3-cm cystic mass at the right  posterior neck.  This is very suggestive of a sebaceous cyst.   For complete medical information, I provided a copy of my August 23, 2004,  office note which summarizes her past medical history as well as my last  full physical from November 15, 2004.   Of note, the patient does have a 5-mm noncalcified nodule in her left  lung which is being followed up by CT scan on February 24, 2006, which  has the appearance of being an incidental finding with no evidence of  real malignancy.   The patient's current medications include:  1. Estratest 1 daily.  2. Toprol XL 100 mg daily.  3. Lasix 40 mg daily.  4. Vytorin 10/20 daily.  5. Metformin 1000 mg b.i.d.  6. Lotrel 10/20 daily.   If I can provide any additional information, please do not hesitate to  contact me.   I appreciate your assistance in the care of this nice patient and look  forward to hearing from you.    Sincerely,      Heinz Knuckles. Norins, MD  Electronically Signed    MEN/MedQ  DD: 02/21/2006  DT: 02/21/2006  Job #: [id number]

## 2010-09-13 ENCOUNTER — Other Ambulatory Visit: Payer: Self-pay | Admitting: Internal Medicine

## 2010-09-13 DIAGNOSIS — Z1231 Encounter for screening mammogram for malignant neoplasm of breast: Secondary | ICD-10-CM

## 2010-09-22 ENCOUNTER — Ambulatory Visit
Admission: RE | Admit: 2010-09-22 | Discharge: 2010-09-22 | Disposition: A | Discharge status: Home / Self Care | Payer: BC Managed Care – PPO | Source: Ambulatory Visit | Attending: Internal Medicine | Admitting: Internal Medicine

## 2010-09-22 DIAGNOSIS — Z1231 Encounter for screening mammogram for malignant neoplasm of breast: Secondary | ICD-10-CM

## 2010-10-13 ENCOUNTER — Other Ambulatory Visit: Payer: Self-pay | Admitting: Internal Medicine

## 2010-10-22 ENCOUNTER — Ambulatory Visit (INDEPENDENT_AMBULATORY_CARE_PROVIDER_SITE_OTHER): Payer: BC Managed Care – PPO | Admitting: Internal Medicine

## 2010-10-22 ENCOUNTER — Other Ambulatory Visit (INDEPENDENT_AMBULATORY_CARE_PROVIDER_SITE_OTHER): Payer: BC Managed Care – PPO

## 2010-10-22 ENCOUNTER — Encounter: Payer: Self-pay | Admitting: Internal Medicine

## 2010-10-22 VITALS — BP 160/74 | HR 70 | Temp 98.0°F | Wt 149.0 lb

## 2010-10-22 DIAGNOSIS — E785 Hyperlipidemia, unspecified: Secondary | ICD-10-CM

## 2010-10-22 DIAGNOSIS — E0590 Thyrotoxicosis, unspecified without thyrotoxic crisis or storm: Secondary | ICD-10-CM

## 2010-10-22 DIAGNOSIS — E119 Type 2 diabetes mellitus without complications: Secondary | ICD-10-CM

## 2010-10-22 DIAGNOSIS — R011 Cardiac murmur, unspecified: Secondary | ICD-10-CM

## 2010-10-22 DIAGNOSIS — Z136 Encounter for screening for cardiovascular disorders: Secondary | ICD-10-CM

## 2010-10-22 DIAGNOSIS — R0989 Other specified symptoms and signs involving the circulatory and respiratory systems: Secondary | ICD-10-CM

## 2010-10-22 DIAGNOSIS — Z0000 Encounter for general adult medical examination without abnormal findings: Secondary | ICD-10-CM

## 2010-10-22 DIAGNOSIS — F17200 Nicotine dependence, unspecified, uncomplicated: Secondary | ICD-10-CM

## 2010-10-22 DIAGNOSIS — I10 Essential (primary) hypertension: Secondary | ICD-10-CM

## 2010-10-22 LAB — CBC WITH DIFFERENTIAL/PLATELET
Basophils Absolute: 0 10*3/uL (ref 0.0–0.1)
Lymphocytes Relative: 35.4 % (ref 12.0–46.0)
Monocytes Relative: 9 % (ref 3.0–12.0)
Platelets: 223 10*3/uL (ref 150.0–400.0)
RDW: 13 % (ref 11.5–14.6)

## 2010-10-22 LAB — LIPID PANEL
HDL: 57.2 mg/dL (ref >39.00)
Triglycerides: 120 mg/dL (ref 0.0–149.0)

## 2010-10-22 LAB — TSH: TSH: 1.57 u[IU]/mL (ref 0.35–5.50)

## 2010-10-22 LAB — HEMOGLOBIN A1C: Hgb A1c MFr Bld: 6.3 % (ref 4.6–6.5)

## 2010-10-22 LAB — HEPATIC FUNCTION PANEL
ALT: 16 U/L (ref 0–35)
AST: 20 U/L (ref 0–37)
Albumin: 3.9 g/dL (ref 3.5–5.2)
Total Protein: 7.2 g/dL (ref 6.0–8.3)

## 2010-10-22 NOTE — Progress Notes (Signed)
Subjective:    Patient ID: [name], female    DOB: [birth year], 60 y.o.   MRN: [id number]  HPI [name] presents for routine medical exam. She has not specific complaints. She does not have an exercise program. She is over due for dental visit. She has been to opthal and got new Rx. She has had her mammogram. There have been no falls or injuries. Life is good.   Past Medical History  Diagnosis Date  . Hypertension   . Diabetes mellitus   . History of sebaceous cyst   . Tobacco abuse   . Cough   . Hyperthyroidism   . Hyperlipemia   . Diabetes mellitus, type 2   . Cancer    Past Surgical History  Procedure Date  . Abdominal hysterectomy    Family History  Problem Relation Age of Onset  . Hypertension Mother   . Sleep apnea Mother   . Cancer Father     lung cancer - smoker  . Hypertension Sister   . Hypertension Brother   . Hypertension Sister   . Diabetes Brother    History   Social History  . Marital Status: Divorced    Spouse Name: N/A    Number of Children: 1  . Years of Education: 11   Occupational History  . weaver    Social History Main Topics  . Smoking status: Current Everyday Smoker -- 0.5 packs/day for 35 years    Types: Cigarettes  . Smokeless tobacco: Never Used   Comment: patient advised to keep diary, develop strategy and stop  . Alcohol Use: Yes     rare  . Drug Use: No  . Sexually Active: Yes -- Female partner(s)   Other Topics Concern  . Not on file   Social History Narrative   11th Grade. Married '84- 13 years/ divorce. 1 son- '68. Sexually active-using condoms. 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 Regular Exercise- no       Review of Systems Review of Systems  Constitutional:  Negative for fever, chills, activity change and unexpected weight change.  HEENT:  Negative for hearing loss, ear pain, congestion, neck stiffness and postnasal drip. Negative for sore throat or swallowing problems. Negative for dental complaints.    Eyes: Negative for vision loss or change in visual acuity.  Respiratory: Negative for chest tightness and wheezing.   Cardiovascular: Negative for chest pain and palpitation. No decreased exercise tolerance Gastrointestinal: No change in bowel habit. No bloating or gas. No reflux or indigestion Genitourinary: Negative for urgency, frequency, flank pain and difficulty urinating.  Musculoskeletal: Negative for myalgias, back pain, arthralgias and gait problem.  Neurological: Negative for dizziness, tremors, weakness and headaches.  Hematological: Negative for adenopathy.  Psychiatric/Behavioral: Negative for behavioral problems and dysphoric mood.   Lab Results  Component Value Date   WBC 6.9 10/22/2010   HGB 13.1 10/22/2010   HCT 39.0 10/22/2010   PLT 223.0 10/22/2010   CHOL 166 10/22/2010   TRIG 120.0 10/22/2010   HDL 57.20 10/22/2010   LDLDIRECT 176.0 04/25/2006   ALT 16 10/22/2010   AST 20 10/22/2010   NA 140 05/28/2010   K 3.6 05/28/2010   CL 101 05/28/2010   CREATININE 0.7 05/28/2010   BUN 8 05/28/2010   CO2 27 05/28/2010   TSH 1.57 10/22/2010   HGBA1C 6.3 10/22/2010   MICROALBUR 140.0 Verified by manual dilution. mg/dL* 04/09/2008   Lab Results  Component Value Date   LDLCALC 85 10/22/2010  Objective:   Physical Exam Vitals reviewed-BP elevated Gen'l: well nourished, well developed AA woman in no distress HEENT - [place]/AT, EACs/TMs normal, oropharynx with upper denture, lower partial, no buccal or palatal lesions, posterior pharynx clear, mucous membranes moist. C&S clear, PERRLA, fundi - normal Neck - supple, no thyromegaly. Soft bruit right neck vs radiated murmur Nodes- negative submental, cervical, supraclavicular regions Chest - no deformity, no CVAT Lungs - cleat without rales, wheezes. No increased work of breathing Breast - skin normal, nipples w/o discharge, right breast with 3 cm density 10-198 position; left  Breast 4cm 11-198.  Cardiovascular - regular rate and rhythm,  quiet precordium, II/VI murmur RSB, II/VI apex, rubs or gallops, 2+ radial, DP and PT pulses Abdomen - BS+ x 4, no HSM, no guarding or rebound or tenderness Pelvic - deferred to gyn Rectal - deferred to gyn Extremities - no clubbing, cyanosis, edema or deformity.  Neuro - A&O x 3, CN II-XII normal, motor strength normal and equal, DTRs 2+ and symmetrical biceps, radial, and patellar tendons. Cerebellar - no tremor, no rigidity, fluid movement and normal gait. Derm - Head, neck, back, abdomen and extremities without suspicious lesions        Assessment & Plan:

## 2010-10-24 DIAGNOSIS — Z0000 Encounter for general adult medical examination without abnormal findings: Secondary | ICD-10-CM | POA: Insufficient documentation

## 2010-10-24 NOTE — Assessment & Plan Note (Signed)
LDL @ 85 is better than goal.  Plan - continue prudent low fat diet           Consider aerobic exercise           Continue medication

## 2010-10-24 NOTE — Assessment & Plan Note (Signed)
Patient is contemplating smoking cessation. Counseled: keep "smoker's" diary, develop a strategy to change behaviors most associated with smoking. At that point return for pre-cessation counseling and determination of need for any medical assistance.  (15 min)

## 2010-10-24 NOTE — Assessment & Plan Note (Signed)
Patient with Murmur at RSB and at apex. Has not had a 2 D echo for several years. Concern for progressive aortic stenosis and mitral valve disease. On exam patient also with a right carotid bruits vs radiation of aortic murmur.  Plan - 2 D echo           Carotid doppler study

## 2010-10-24 NOTE — Assessment & Plan Note (Signed)
Asymptomatic. TSH in normal range.  Plan - continue present dose of levothyroxine.           F/u lab in 1 year

## 2010-10-24 NOTE — Assessment & Plan Note (Signed)
Doing well with diet and adherence to medical regimen. A1C is below goal!  Plan - continue present regimen.

## 2010-10-24 NOTE — Assessment & Plan Note (Signed)
Interval hx w/o new problems. She has been doing well. Physical exam is normal. She is due for colonoscopy and we can schedule this for her when she is ready. She is current with mammography.Immunizations: Tetanus August '10. She is a candidate for pneumonia vaccine and shingles vaccine. 122 lead EKG with RsR', increased voltage c/w LV hypertrophy. No signs of ischemia or injury.   In summary - a very nice woman who is doing a GREAT job managing her chronic problems. She is ready to stop smoking and I look forward to her return once she has done the preparation for cessation work.

## 2010-10-24 NOTE — Assessment & Plan Note (Signed)
BP Readings from Last 3 Encounters:  10/22/10 160/74  05/28/10 178/80  03/30/10 180/90   Suboptimal control! Pateint on 4 drugs: diuretic, CCB, ARB, BB  Plan - continue present medications           Patient to monitor away from the doctors office           For persistent elevation will evaluate for RAS

## 2010-10-25 ENCOUNTER — Encounter: Payer: Self-pay | Admitting: Internal Medicine

## 2010-11-19 ENCOUNTER — Other Ambulatory Visit: Payer: Self-pay | Admitting: Internal Medicine

## 2010-11-19 DIAGNOSIS — I6529 Occlusion and stenosis of unspecified carotid artery: Secondary | ICD-10-CM

## 2010-11-22 ENCOUNTER — Ambulatory Visit (HOSPITAL_COMMUNITY): Payer: BC Managed Care – PPO | Attending: Internal Medicine | Admitting: Radiology

## 2010-11-22 ENCOUNTER — Encounter (INDEPENDENT_AMBULATORY_CARE_PROVIDER_SITE_OTHER): Payer: BC Managed Care – PPO | Admitting: Cardiology

## 2010-11-22 DIAGNOSIS — I10 Essential (primary) hypertension: Secondary | ICD-10-CM | POA: Insufficient documentation

## 2010-11-22 DIAGNOSIS — R0989 Other specified symptoms and signs involving the circulatory and respiratory systems: Secondary | ICD-10-CM

## 2010-11-22 DIAGNOSIS — F17200 Nicotine dependence, unspecified, uncomplicated: Secondary | ICD-10-CM | POA: Insufficient documentation

## 2010-11-22 DIAGNOSIS — I6529 Occlusion and stenosis of unspecified carotid artery: Secondary | ICD-10-CM

## 2010-11-22 DIAGNOSIS — E119 Type 2 diabetes mellitus without complications: Secondary | ICD-10-CM | POA: Insufficient documentation

## 2010-11-22 DIAGNOSIS — R011 Cardiac murmur, unspecified: Secondary | ICD-10-CM

## 2010-11-24 ENCOUNTER — Telehealth: Payer: Self-pay | Admitting: Internal Medicine

## 2010-11-24 NOTE — Telephone Encounter (Signed)
Please call: carotid doppler reveals NO blockages

## 2010-11-25 NOTE — Telephone Encounter (Signed)
Left message on machine to return my call. 

## 2010-11-26 NOTE — Telephone Encounter (Signed)
Pt informed

## 2010-12-12 ENCOUNTER — Encounter: Payer: Self-pay | Admitting: Internal Medicine

## 2011-01-25 HISTORY — PX: RETINOPATHY SURGERY: SHX765

## 2011-04-10 ENCOUNTER — Other Ambulatory Visit: Payer: Self-pay | Admitting: Internal Medicine

## 2011-04-11 NOTE — Telephone Encounter (Signed)
Done

## 2011-06-23 ENCOUNTER — Encounter (INDEPENDENT_AMBULATORY_CARE_PROVIDER_SITE_OTHER): Payer: BC Managed Care – PPO | Admitting: Ophthalmology

## 2011-10-10 ENCOUNTER — Ambulatory Visit (INDEPENDENT_AMBULATORY_CARE_PROVIDER_SITE_OTHER): Payer: BC Managed Care – PPO | Admitting: Ophthalmology

## 2011-10-11 ENCOUNTER — Other Ambulatory Visit: Payer: Self-pay | Admitting: Internal Medicine

## 2011-10-11 DIAGNOSIS — Z1231 Encounter for screening mammogram for malignant neoplasm of breast: Secondary | ICD-10-CM

## 2011-10-13 ENCOUNTER — Ambulatory Visit (INDEPENDENT_AMBULATORY_CARE_PROVIDER_SITE_OTHER): Payer: BC Managed Care – PPO | Admitting: Ophthalmology

## 2011-10-13 DIAGNOSIS — E11359 Type 2 diabetes mellitus with proliferative diabetic retinopathy without macular edema: Secondary | ICD-10-CM

## 2011-10-13 DIAGNOSIS — H35039 Hypertensive retinopathy, unspecified eye: Secondary | ICD-10-CM

## 2011-10-13 DIAGNOSIS — H2510 Age-related nuclear cataract, unspecified eye: Secondary | ICD-10-CM

## 2011-10-13 DIAGNOSIS — H43819 Vitreous degeneration, unspecified eye: Secondary | ICD-10-CM

## 2011-10-13 DIAGNOSIS — I10 Essential (primary) hypertension: Secondary | ICD-10-CM

## 2011-10-13 DIAGNOSIS — E1165 Type 2 diabetes mellitus with hyperglycemia: Secondary | ICD-10-CM

## 2011-10-14 ENCOUNTER — Other Ambulatory Visit (INDEPENDENT_AMBULATORY_CARE_PROVIDER_SITE_OTHER): Payer: BC Managed Care – PPO | Admitting: Ophthalmology

## 2011-10-14 DIAGNOSIS — E1165 Type 2 diabetes mellitus with hyperglycemia: Secondary | ICD-10-CM

## 2011-10-14 DIAGNOSIS — E11359 Type 2 diabetes mellitus with proliferative diabetic retinopathy without macular edema: Secondary | ICD-10-CM

## 2011-10-26 ENCOUNTER — Ambulatory Visit
Admission: RE | Admit: 2011-10-26 | Discharge: 2011-10-26 | Disposition: A | Discharge status: Home / Self Care | Payer: BC Managed Care – PPO | Source: Ambulatory Visit | Attending: Internal Medicine | Admitting: Internal Medicine

## 2011-10-26 DIAGNOSIS — Z1231 Encounter for screening mammogram for malignant neoplasm of breast: Secondary | ICD-10-CM

## 2011-10-28 ENCOUNTER — Other Ambulatory Visit: Payer: Self-pay | Admitting: Internal Medicine

## 2011-10-28 DIAGNOSIS — R928 Other abnormal and inconclusive findings on diagnostic imaging of breast: Secondary | ICD-10-CM

## 2011-11-02 ENCOUNTER — Ambulatory Visit
Admission: RE | Admit: 2011-11-02 | Discharge: 2011-11-02 | Disposition: A | Discharge status: Home / Self Care | Payer: BC Managed Care – PPO | Source: Ambulatory Visit | Attending: Internal Medicine | Admitting: Internal Medicine

## 2011-11-02 DIAGNOSIS — R928 Other abnormal and inconclusive findings on diagnostic imaging of breast: Secondary | ICD-10-CM

## 2011-11-14 ENCOUNTER — Other Ambulatory Visit: Payer: Self-pay | Admitting: Internal Medicine

## 2011-12-13 ENCOUNTER — Ambulatory Visit (INDEPENDENT_AMBULATORY_CARE_PROVIDER_SITE_OTHER)
Admission: RE | Admit: 2011-12-13 | Discharge: 2011-12-13 | Disposition: A | Discharge status: Home / Self Care | Payer: BC Managed Care – PPO | Source: Ambulatory Visit | Attending: Internal Medicine | Admitting: Internal Medicine

## 2011-12-13 ENCOUNTER — Other Ambulatory Visit (INDEPENDENT_AMBULATORY_CARE_PROVIDER_SITE_OTHER): Payer: BC Managed Care – PPO

## 2011-12-13 ENCOUNTER — Ambulatory Visit (INDEPENDENT_AMBULATORY_CARE_PROVIDER_SITE_OTHER): Payer: BC Managed Care – PPO | Admitting: Internal Medicine

## 2011-12-13 ENCOUNTER — Encounter: Payer: Self-pay | Admitting: Internal Medicine

## 2011-12-13 VITALS — BP 158/90 | HR 64 | Temp 98.3°F | Resp 10 | Ht 63.5 in | Wt 141.1 lb

## 2011-12-13 DIAGNOSIS — E785 Hyperlipidemia, unspecified: Secondary | ICD-10-CM

## 2011-12-13 DIAGNOSIS — F17200 Nicotine dependence, unspecified, uncomplicated: Secondary | ICD-10-CM

## 2011-12-13 DIAGNOSIS — E119 Type 2 diabetes mellitus without complications: Secondary | ICD-10-CM

## 2011-12-13 DIAGNOSIS — I10 Essential (primary) hypertension: Secondary | ICD-10-CM

## 2011-12-13 DIAGNOSIS — Z23 Encounter for immunization: Secondary | ICD-10-CM

## 2011-12-13 DIAGNOSIS — E0590 Thyrotoxicosis, unspecified without thyrotoxic crisis or storm: Secondary | ICD-10-CM

## 2011-12-13 DIAGNOSIS — Z0000 Encounter for general adult medical examination without abnormal findings: Secondary | ICD-10-CM

## 2011-12-13 LAB — COMPREHENSIVE METABOLIC PANEL
Albumin: 3.5 g/dL (ref 3.5–5.2)
Alkaline Phosphatase: 57 U/L (ref 39–117)
BUN: 11 mg/dL (ref 6–23)
CO2: 30 mEq/L (ref 19–32)
GFR: 102.31 mL/min (ref >60.00)
Glucose, Bld: 79 mg/dL (ref 70–99)
Potassium: 3.5 mEq/L (ref 3.5–5.1)

## 2011-12-13 LAB — LIPID PANEL
HDL: 64.1 mg/dL (ref >39.00)
Triglycerides: 137 mg/dL (ref 0.0–149.0)

## 2011-12-13 LAB — LDL CHOLESTEROL, DIRECT: Direct LDL: 187.4 mg/dL

## 2011-12-13 LAB — HEPATIC FUNCTION PANEL
ALT: 15 U/L (ref 0–35)
Bilirubin, Direct: 0.1 mg/dL (ref 0.0–0.3)
Total Bilirubin: 1.1 mg/dL (ref 0.3–1.2)

## 2011-12-13 LAB — HEMOGLOBIN A1C: Hgb A1c MFr Bld: 6.2 % (ref 4.6–6.5)

## 2011-12-13 MED ORDER — PNEUMOCOCCAL VAC POLYVALENT 25 MCG/0.5ML IJ INJ: 0.5000 mL | INJECTION | INTRAMUSCULAR | Status: AC

## 2011-12-13 NOTE — Patient Instructions (Addendum)
Thanks for coming in to see me.  Please consider being a non-smoker.  A full report with labs and x-ray will be mailed.

## 2011-12-13 NOTE — Progress Notes (Signed)
Subjective:    Patient ID: [name], female    DOB: [birth year], 61 y.o.   MRN: [id number]  HPI The patient is here for annual  wellness examination and management of other chronic and acute problems. Feeling good, no major illness, laser therapy for diabetic retinopathy, no injury. Had abnormal mammogram with f/u U/S 6 mm fibrocystic lesion - for follow up U/S in 6 months.   The risk factors are reflected in the social history.  The roster of all physicians providing medical care to patient - is listed in the Snapshot section of the chart.  Activities of daily living:  The patient is 100% inedpendent in all ADLs: dressing, toileting, feeding as well as independent mobility  Home safety : The patient has no smoke detectors in the home - advised to obtain. Falls - none. They wear seatbelts. No firearms at home. There is no violence in the home.   There is no risks for hepatitis, STDs or HIV. There is no history of blood transfusion. They have no travel history to infectious disease endemic areas of the world.  The patient has  seen their dentist in the last 12 months. They have seen their eye doctor in the last year - treated for diabetic retinopathy. They deny any hearing difficulty and have not had audiologic testing in the last year.    They do not  have excessive sun exposure. Discussed the need for sun protection: hats, long sleeves and use of sunscreen if there is significant sun exposure.   Diet: the importance of a healthy diet is discussed. They do have a healthy diet.  The patient has a regular exercise program.  The benefits of regular aerobic exercise were discussed.  Depression screen: there are no signs or vegative symptoms of depression- irritability, change in appetite, anhedonia, sadness/tearfullness.  Cognitive assessment: the patient manages all their financial and personal affairs and is actively engaged.   Past Medical History  Diagnosis Date  . Hypertension    . Diabetes mellitus   . History of sebaceous cyst   . Tobacco abuse   . Cough   . Hyperthyroidism   . Hyperlipemia   . Diabetes mellitus, type 2   . Cancer    Past Surgical History  Procedure Date  . Abdominal hysterectomy    Family History  Problem Relation Age of Onset  . Hypertension Mother   . Sleep apnea Mother   . Cancer Father     lung cancer - smoker  . Hypertension Sister   . Hypertension Brother   . Hypertension Sister   . Diabetes Brother    History   Social History  . Marital Status: Divorced    Spouse Name: N/A    Number of Children: 1  . Years of Education: 11   Occupational History  . weaver    Social History Main Topics  . Smoking status: Current Every Day Smoker -- 0.5 packs/day for 35 years    Types: Cigarettes  . Smokeless tobacco: Never Used     Comment: patient advised to keep diary, develop strategy and stop  . Alcohol Use: Yes     Comment: rare  . Drug Use: No  . Sexually Active: Yes -- Female partner(s)   Other Topics Concern  . Not on file   Social History Narrative   11th Grade. Married '84- 13 years/ divorce. 1 son- '68. Sexually active-using condoms. 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 Regular Exercise- no  Current Outpatient Prescriptions on File Prior to Visit  Medication Sig Dispense Refill  . amLODipine-benazepril (LOTREL) 10-20 MG per capsule TAKE 1 CAPSULE BY MOUTH ONCE DAILY  30 capsule  10  . estradiol (ESTRACE) 0.5 MG tablet TAKE 1 TABLET BY MOUTH DAILY  30 tablet  8  . ezetimibe-simvastatin (VYTORIN) 10-20 MG per tablet Take 1 tablet by mouth at bedtime.        . furosemide (LASIX) 40 MG tablet TAKE 1 TABLET BY MOUTH EVERY DAY  30 tablet  5  . glucose blood test strip 1 each by Other route as needed. Use as instructed       . metFORMIN (GLUCOPHAGE) 1000 MG tablet TAKE 1 TABLET BY MOUTH 2 TIMES A DAY  60 tablet  6  . metoprolol succinate (TOPROL-XL) 100 MG 24 hr tablet TAKE 1 TABLET EVERY DAY FOR BLOOD PRESSURE  30  tablet  10  . [DISCONTINUED] metFORMIN (GLUMETZA) 1000 MG (MOD) 24 hr tablet Take 1,000 mg by mouth daily with breakfast.           Vision, hearing, body mass index were assessed and reviewed.   During the course of the visit the patient was educated and counseled about appropriate screening and preventive services including : fall prevention , diabetes screening, nutrition counseling, colorectal cancer screening, and recommended immunizations.    Review of Systems Constitutional:  Negative for fever, chills, activity change and unexpected weight change.  HEENT:  Negative for hearing loss, ear pain, congestion, neck stiffness and postnasal drip. Negative for sore throat or swallowing problems. Negative for dental complaints.   Eyes: Negative for vision loss or change in visual acuity.  Respiratory: Negative for chest tightness and wheezing. Negative for DOE.   Cardiovascular: Negative for chest pain or palpitations. No decreased exercise tolerance Gastrointestinal: No change in bowel habit. No bloating or gas. No reflux or indigestion Genitourinary: Negative for urgency, frequency, flank pain and difficulty urinating.  Musculoskeletal: Negative for myalgias, back pain, arthralgias and gait problem.  Neurological: Negative for dizziness, tremors, weakness and headaches.  Hematological: Negative for adenopathy.  Psychiatric/Behavioral: Negative for behavioral problems and dysphoric mood.       Objective:   Physical Exam Filed Vitals:   12/13/11 0855  BP: 158/90  Pulse: 64  Temp: 98.3 F (36.8 C)  Resp: 10   Wt Readings from Last 3 Encounters:  12/13/11 141 lb 1.3 oz (63.993 kg)  10/22/10 149 lb (67.586 kg)  05/28/10 156 lb (70.761 kg)   Gen'l: well nourished, well developed AA Woman in no distress HEENT - [place]/AT, EACs/TMs normal, oropharynx with full upper denture, native remaining teeth mandible but missing molars and premolars, no buccal or palatal lesions, posterior pharynx  clear, mucous membranes moist. C&S clear, PERRLA, fundi - normal Neck - supple, no thyromegaly Nodes- negative submental, cervical, supraclavicular regions Chest - no deformity, no CVAT Lungs - cleat without rales, wheezes. No increased work of breathing Breast - - Skin normal, nipples w/o discharge, no fixed mass or lesion, no axillary adenopathy. Cardiovascular - regular rate and rhythm, quiet precordium, no murmurs, rubs or gallops, 2+ radial, DP and PT pulses Abdomen - BS+ x 4, no HSM, no guarding or rebound or tenderness Pelvic - deferred  Rectal - deferred  Extremities - no clubbing, cyanosis, edema or deformity.  Neuro - A&O x 3, CN II-XII normal, motor strength normal and equal, DTRs 2+ and symmetrical biceps, radial, and patellar tendons. Cerebellar - no tremor, no rigidity, fluid movement  and normal gait. Nl sensation to light touch, pin-prick, vibration Derm - Head, neck, back, abdomen and extremities without suspicious lesions  Lab Results  Component Value Date   WBC 6.9 10/22/2010   HGB 13.1 10/22/2010   HCT 39.0 10/22/2010   PLT 223.0 10/22/2010   GLUCOSE 79 12/13/2011   CHOL 284* 12/13/2011   TRIG 137.0 12/13/2011   HDL 64.10 12/13/2011   LDLDIRECT 187.4 12/13/2011   LDLCALC 85 10/22/2010        ALT 15 12/13/2011   AST 18 12/13/2011        NA 136 12/13/2011   K 3.5 12/13/2011   CL 100 12/13/2011   CREATININE 0.7 12/13/2011   BUN 11 12/13/2011   CO2 30 12/13/2011   TSH 1.15 12/13/2011   HGBA1C 6.2 12/13/2011   MICROALBUR 140.0 Verified by manual dilution. mg/dL* 04/09/2008         Assessment & Plan:

## 2011-12-16 ENCOUNTER — Telehealth: Payer: Self-pay | Admitting: *Deleted

## 2011-12-16 MED ORDER — ATORVASTATIN CALCIUM 40 MG PO TABS: 40.0000 mg | ORAL_TABLET | Freq: Every day | ORAL | Status: DC

## 2011-12-16 NOTE — Assessment & Plan Note (Signed)
Interval history notable for laser therapy for retinopathy otherwise normal. Physical exam is normal. Lab review - results in normal range except for cholesterol. She is current for colorectal and breast cancer screening. Immunizations - current for tetanus; good candidate for pneumonia vaccine.  In summary - a very nice woman who appears medically stable but does need treatment for her cholesterol

## 2011-12-16 NOTE — Assessment & Plan Note (Signed)
Lab Results  Component Value Date   TSH 1.15 12/13/2011   Patient is on no medication.   Plan - no active disease based on serial labs.

## 2011-12-16 NOTE — Assessment & Plan Note (Signed)
Encouraged patient to stop smoking

## 2011-12-16 NOTE — Assessment & Plan Note (Addendum)
BP Readings from Last 3 Encounters:  12/13/11 158/90  10/22/10 160/74  05/28/10 178/80   Suboptimal control.   Plan Insure adherence - taking all BP meds  Follow-up office visit - to adjust medications

## 2011-12-16 NOTE — Assessment & Plan Note (Signed)
History of good control on Vytorin. Stopped due to cost. LDL now 187 -TOO HIGH!  Plan Atorvastatin 40 mg daily  F/u lab 4 weeks after initiation

## 2011-12-16 NOTE — Telephone Encounter (Signed)
Called pt and told her Dr. Linda Hedges sent a new Rx for atorvastatin to the pharmacy. Please return for labs in 4 weeks. Pt understood.

## 2011-12-16 NOTE — Assessment & Plan Note (Signed)
Lab Results  Component Value Date   HGBA1C 6.2 12/13/2011   Good control on present regimen  Plan  Continue present regimen

## 2011-12-29 ENCOUNTER — Other Ambulatory Visit: Payer: Self-pay | Admitting: Internal Medicine

## 2011-12-30 ENCOUNTER — Other Ambulatory Visit: Payer: Self-pay | Admitting: *Deleted

## 2011-12-30 MED ORDER — FUROSEMIDE 40 MG PO TABS: 40.0000 mg | ORAL_TABLET | Freq: Every day | ORAL | Status: DC

## 2011-12-30 MED ORDER — METFORMIN HCL 1000 MG PO TABS: 1000.0000 mg | ORAL_TABLET | Freq: Two times a day (BID) | ORAL | Status: DC

## 2012-02-14 ENCOUNTER — Ambulatory Visit (INDEPENDENT_AMBULATORY_CARE_PROVIDER_SITE_OTHER): Payer: BC Managed Care – PPO | Admitting: Ophthalmology

## 2012-02-29 ENCOUNTER — Ambulatory Visit (INDEPENDENT_AMBULATORY_CARE_PROVIDER_SITE_OTHER): Payer: BC Managed Care – PPO | Admitting: Ophthalmology

## 2012-02-29 DIAGNOSIS — E1165 Type 2 diabetes mellitus with hyperglycemia: Secondary | ICD-10-CM

## 2012-02-29 DIAGNOSIS — E11359 Type 2 diabetes mellitus with proliferative diabetic retinopathy without macular edema: Secondary | ICD-10-CM

## 2012-02-29 DIAGNOSIS — I10 Essential (primary) hypertension: Secondary | ICD-10-CM

## 2012-02-29 DIAGNOSIS — H43819 Vitreous degeneration, unspecified eye: Secondary | ICD-10-CM

## 2012-02-29 DIAGNOSIS — H35039 Hypertensive retinopathy, unspecified eye: Secondary | ICD-10-CM

## 2012-02-29 DIAGNOSIS — E11319 Type 2 diabetes mellitus with unspecified diabetic retinopathy without macular edema: Secondary | ICD-10-CM

## 2012-02-29 DIAGNOSIS — H2510 Age-related nuclear cataract, unspecified eye: Secondary | ICD-10-CM

## 2012-04-02 ENCOUNTER — Other Ambulatory Visit: Payer: Self-pay | Admitting: Internal Medicine

## 2012-05-01 ENCOUNTER — Other Ambulatory Visit: Payer: BC Managed Care – PPO

## 2012-05-24 ENCOUNTER — Ambulatory Visit
Admission: RE | Admit: 2012-05-24 | Discharge: 2012-05-24 | Disposition: A | Discharge status: Home / Self Care | Payer: BC Managed Care – PPO | Source: Ambulatory Visit | Attending: Internal Medicine | Admitting: Internal Medicine

## 2012-05-24 DIAGNOSIS — N63 Unspecified lump in unspecified breast: Secondary | ICD-10-CM

## 2012-07-16 ENCOUNTER — Other Ambulatory Visit: Payer: Self-pay | Admitting: Internal Medicine

## 2012-09-04 ENCOUNTER — Ambulatory Visit (INDEPENDENT_AMBULATORY_CARE_PROVIDER_SITE_OTHER): Payer: Self-pay | Admitting: Ophthalmology

## 2012-09-19 ENCOUNTER — Other Ambulatory Visit: Payer: Self-pay | Admitting: Internal Medicine

## 2012-09-29 ENCOUNTER — Other Ambulatory Visit: Payer: Self-pay | Admitting: Internal Medicine

## 2012-11-02 ENCOUNTER — Other Ambulatory Visit: Payer: Self-pay | Admitting: Internal Medicine

## 2012-11-02 DIAGNOSIS — N63 Unspecified lump in unspecified breast: Secondary | ICD-10-CM

## 2012-11-29 ENCOUNTER — Other Ambulatory Visit: Payer: Self-pay | Admitting: Internal Medicine

## 2012-11-30 ENCOUNTER — Ambulatory Visit
Admission: RE | Admit: 2012-11-30 | Discharge: 2012-11-30 | Disposition: A | Discharge status: Home / Self Care | Payer: No Typology Code available for payment source | Source: Ambulatory Visit | Attending: Internal Medicine | Admitting: Internal Medicine

## 2012-11-30 DIAGNOSIS — N63 Unspecified lump in unspecified breast: Secondary | ICD-10-CM

## 2012-12-13 ENCOUNTER — Encounter: Payer: Self-pay | Admitting: Internal Medicine

## 2012-12-13 ENCOUNTER — Ambulatory Visit (INDEPENDENT_AMBULATORY_CARE_PROVIDER_SITE_OTHER): Payer: BC Managed Care – PPO | Admitting: Internal Medicine

## 2012-12-13 ENCOUNTER — Other Ambulatory Visit (INDEPENDENT_AMBULATORY_CARE_PROVIDER_SITE_OTHER): Payer: BC Managed Care – PPO

## 2012-12-13 VITALS — BP 210/104 | HR 75 | Temp 99.3°F | Ht 63.5 in | Wt 152.0 lb

## 2012-12-13 DIAGNOSIS — E785 Hyperlipidemia, unspecified: Secondary | ICD-10-CM

## 2012-12-13 DIAGNOSIS — E0590 Thyrotoxicosis, unspecified without thyrotoxic crisis or storm: Secondary | ICD-10-CM

## 2012-12-13 DIAGNOSIS — E119 Type 2 diabetes mellitus without complications: Secondary | ICD-10-CM

## 2012-12-13 DIAGNOSIS — Z23 Encounter for immunization: Secondary | ICD-10-CM

## 2012-12-13 DIAGNOSIS — Z0000 Encounter for general adult medical examination without abnormal findings: Secondary | ICD-10-CM

## 2012-12-13 DIAGNOSIS — Z2911 Encounter for prophylactic immunotherapy for respiratory syncytial virus (RSV): Secondary | ICD-10-CM

## 2012-12-13 DIAGNOSIS — R0989 Other specified symptoms and signs involving the circulatory and respiratory systems: Secondary | ICD-10-CM | POA: Insufficient documentation

## 2012-12-13 DIAGNOSIS — I10 Essential (primary) hypertension: Secondary | ICD-10-CM

## 2012-12-13 LAB — LDL CHOLESTEROL, DIRECT: Direct LDL: 130.4 mg/dL

## 2012-12-13 LAB — HEPATIC FUNCTION PANEL
AST: 21 U/L (ref 0–37)
Albumin: 3.3 g/dL — ABNORMAL LOW (ref 3.5–5.2)
Alkaline Phosphatase: 64 U/L (ref 39–117)
Bilirubin, Direct: 0.1 mg/dL (ref 0.0–0.3)

## 2012-12-13 LAB — COMPREHENSIVE METABOLIC PANEL
ALT: 23 U/L (ref 0–35)
Alkaline Phosphatase: 64 U/L (ref 39–117)
CO2: 30 mEq/L (ref 19–32)
Creatinine, Ser: 0.7 mg/dL (ref 0.4–1.2)
Potassium: 4 mEq/L (ref 3.5–5.1)
Sodium: 138 mEq/L (ref 135–145)
Total Bilirubin: 1 mg/dL (ref 0.3–1.2)
Total Protein: 6.6 g/dL (ref 6.0–8.3)

## 2012-12-13 LAB — LIPID PANEL
HDL: 71.7 mg/dL (ref >39.00)
Total CHOL/HDL Ratio: 3
VLDL: 18.8 mg/dL (ref 0.0–40.0)

## 2012-12-13 LAB — HEMOGLOBIN A1C: Hgb A1c MFr Bld: 6.7 % — ABNORMAL HIGH (ref 4.6–6.5)

## 2012-12-13 LAB — HEMOGLOBIN AND HEMATOCRIT, BLOOD: HCT: 39.4 % (ref 36.0–46.0)

## 2012-12-13 MED ORDER — GLUCOSE BLOOD VI STRP: ORAL_STRIP | Status: DC

## 2012-12-13 MED ORDER — METFORMIN HCL ER 750 MG PO TB24: 750.0000 mg | ORAL_TABLET | Freq: Every day | ORAL | Status: DC

## 2012-12-13 NOTE — Progress Notes (Signed)
Pre visit review using our clinic review tool, if applicable. No additional management support is needed unless otherwise documented below in the visit note. 

## 2012-12-13 NOTE — Patient Instructions (Signed)
Good to see you.  Your exam today is normal, including a vaginal exam. You do not need another Vaginal exam for 5 years.  Lab today - you will receive a written report. If the blood sugar is up you will start an additional medication. The Metformin will be changed to a once a day form.  You have bruits at the carotid arteries - a whooshing sound that suggests possible partial blockages. You will be scheduled for a carotid doppler - an ultrasound test to look for blockages.   Your insurance should pay for a podiatrist to cut your toenails since they are thick and you are a diabetic.  Immunizations: Shingles vaccine today and a flu shot.  Please be sure to see your eye doctor.  Have a great holiday season

## 2012-12-13 NOTE — Progress Notes (Signed)
Subjective:    Patient ID: [name], female    DOB: 31-Dec-1950, 62 y.o.   MRN: [id number]  HPI Ms. Gibbon presents for routine medical follow-up. In the interval she has been well: no major illness, no surgery. She has had a mammogram. She has not been to the eye doctor in the last 12 months. She is exercising on a regular basis - walking. She works on her diet.   Past Medical History  Diagnosis Date  . Hypertension   . Diabetes mellitus   . History of sebaceous cyst   . Tobacco abuse   . Cough   . Hyperthyroidism   . Hyperlipemia   . Diabetes mellitus, type 2    Past Surgical History  Procedure Laterality Date  . Abdominal hysterectomy    . Retinopathy surgery  2013    Dr. Zigmund Daniel   Family History  Problem Relation Age of Onset  . Hypertension Mother   . Sleep apnea Mother   . Cancer Father     lung cancer - smoker  . Hypertension Sister   . Hypertension Brother   . Hypertension Sister   . Diabetes Brother    History   Social History  . Marital Status: Divorced    Spouse Name: N/A    Number of Children: 1  . Years of Education: 11   Occupational History  . weaver    Social History Main Topics  . Smoking status: Current Every Day Smoker -- 0.50 packs/day for 35 years    Types: Cigarettes  . Smokeless tobacco: Never Used     Comment: patient advised to keep diary, develop strategy and stop  . Alcohol Use: Yes     Comment: rare  . Drug Use: No  . Sexual Activity: Yes    Partners: Male   Other Topics Concern  . Not on file   Social History Narrative   11th Grade. Married '84- 13 years/ divorce. 1 son- '68. Sexually active-using condoms. 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 Regular Exercise- no    Current Outpatient Prescriptions on File Prior to Visit  Medication Sig Dispense Refill  . amLODipine-benazepril (LOTREL) 10-20 MG per capsule TAKE 1 CAPSULE BY MOUTH ONCE DAILY  30 capsule  10  . atorvastatin (LIPITOR) 40 MG tablet Take 1 tablet  (40 mg total) by mouth daily.  30 tablet  11  . estradiol (ESTRACE) 0.5 MG tablet TAKE 1 TABLET BY MOUTH DAILY  30 tablet  8  . furosemide (LASIX) 40 MG tablet TAKE 1 TABLET (40 MG TOTAL) BY MOUTH DAILY.  30 tablet  5  . metFORMIN (GLUCOPHAGE) 1000 MG tablet TAKE 1 TABLET (1,000 MG TOTAL) BY MOUTH 2 (TWO) TIMES DAILY.  60 tablet  5  . metoprolol succinate (TOPROL-XL) 100 MG 24 hr tablet TAKE 1 TABLET EVERY DAY FOR BLOOD PRESSURE  30 tablet  10   No current facility-administered medications on file prior to visit.      Review of Systems Constitutional:  Negative for fever, chills, activity change and unexpected weight change.  HEENT:  Negative for hearing loss, ear pain, congestion, neck stiffness and postnasal drip. Negative for sore throat or swallowing problems. Negative for dental complaints.   Eyes: Negative for vision loss or change in visual acuity.  Respiratory: Negative for chest tightness and wheezing. Negative for DOE.   Cardiovascular: Negative for chest pain or palpitations. No decreased exercise tolerance Gastrointestinal: No change in bowel habit. No bloating or gas. No reflux  or indigestion Genitourinary: Negative for urgency, frequency, flank pain and difficulty urinating.  Musculoskeletal: Negative for myalgias, back pain, arthralgias and gait problem.  Neurological: Negative for dizziness, tremors, weakness and headaches.  Hematological: Negative for adenopathy.  Psychiatric/Behavioral: Negative for behavioral problems and dysphoric mood.       Objective:   Physical Exam Filed Vitals:   12/13/12 0850  BP: 210/104  Pulse: 75  Temp: 99.3 F (37.4 C)   Wt Readings from Last 3 Encounters:  12/13/12 152 lb (68.947 kg)  12/13/11 141 lb 1.3 oz (63.993 kg)  10/22/10 149 lb (67.586 kg)   Gen'l: well nourished, well developed Woman in no distress HEENT - [place]/AT, EACs/TMs normal, oropharynx with Upper denture, mandible with native dentition in good condition, no buccal  or palatal lesions, posterior pharynx clear, mucous membranes moist. C&S clear, PERRLA, fundi - normal Neck - supple, no thyromegaly Nodes- negative submental, cervical, supraclavicular regions Chest - no deformity, no CVAT Lungs - clea without rales, wheezes. No increased work of breathing Breast - - Skin normal, nipples w/o discharge, no fixed mass or lesion, no axillary adenopathy. Cardiovascular - regular rate and rhythm, quiet precordium, no murmurs, rubs or gallops, 2+ radial, DP and PT pulses. Bilateral carotid bruits Abdomen - BS+ x 4, no HSM, no guarding or rebound or tenderness Pelvic - NEG/BUS nl, vaginal mucosa mildly atrophic, absent cervix Rectal - deferred  Extremities - no clubbing, cyanosis, edema or deformity.  Neuro - A&O x 3, CN II-XII normal, motor strength normal and equal, DTRs 2+ and symmetrical biceps, radial, and patellar tendons. Cerebellar - no tremor, no rigidity, fluid movement and normal gait. Derm - Head, neck, back, abdomen and extremities without suspicious lesions  Recent Results (from the past 2160 hour(s))  HEMOGLOBIN A1C     Status: Abnormal   Collection Time    12/13/12 10:04 AM      Result Value Range   Hemoglobin A1C 6.7 (*) 4.6 - 6.5 %   Comment: Glycemic Control Guidelines for People with Diabetes:Non Diabetic:  <6%Goal of Therapy: <7%Additional Action Suggested:  >8%   HEPATIC FUNCTION PANEL     Status: Abnormal   Collection Time    12/13/12 10:04 AM      Result Value Range   Total Bilirubin 1.0  0.3 - 1.2 mg/dL   Bilirubin, Direct 0.1  0.0 - 0.3 mg/dL   Alkaline Phosphatase 64  39 - 117 U/L   AST 21  0 - 37 U/L   ALT 23  0 - 35 U/L   Total Protein 6.6  6.0 - 8.3 g/dL   Albumin 3.3 (*) 3.5 - 5.2 g/dL  TSH     Status: None   Collection Time    12/13/12 10:04 AM      Result Value Range   TSH 1.39  0.35 - 5.50 uIU/mL  COMPREHENSIVE METABOLIC PANEL     Status: Abnormal   Collection Time    12/13/12 10:04 AM      Result Value Range    Sodium 138  135 - 145 mEq/L   Potassium 4.0  3.5 - 5.1 mEq/L   Chloride 104  96 - 112 mEq/L   CO2 30  19 - 32 mEq/L   Glucose, Bld 120 (*) 70 - 99 mg/dL   BUN 9  6 - 23 mg/dL   Creatinine, Ser 0.7  0.4 - 1.2 mg/dL   Total Bilirubin 1.0  0.3 - 1.2 mg/dL   Alkaline Phosphatase 64  39 - 117 U/L   AST 21  0 - 37 U/L   ALT 23  0 - 35 U/L   Total Protein 6.6  6.0 - 8.3 g/dL   Albumin 3.3 (*) 3.5 - 5.2 g/dL   Calcium 9.0  8.4 - 10.5 mg/dL   GFR 101.98  >60.00 mL/min  LIPID PANEL     Status: Abnormal   Collection Time    12/13/12 10:04 AM      Result Value Range   Cholesterol 214 (*) 0 - 200 mg/dL   Comment: ATP III Classification       Desirable:  < 200 mg/dL               Borderline High:  200 - 239 mg/dL          High:  > = 240 mg/dL   Triglycerides 94.0  0.0 - 149.0 mg/dL   Comment: Normal:  <150 mg/dLBorderline High:  150 - 199 mg/dL   HDL 71.70  >39.00 mg/dL   VLDL 18.8  0.0 - 40.0 mg/dL   Total CHOL/HDL Ratio 3     Comment:                Men          Women1/2 Average Risk     3.4          3.3Average Risk          5.0          4.42X Average Risk          9.6          7.13X Average Risk          15.0          11.0                      HEMOGLOBIN AND HEMATOCRIT, BLOOD     Status: None   Collection Time    12/13/12 10:04 AM      Result Value Range   Hemoglobin 13.3  12.0 - 15.0 g/dL   HCT 39.4  36.0 - 46.0 %  LDL CHOLESTEROL, DIRECT     Status: None   Collection Time    12/13/12 10:04 AM      Result Value Range   Direct LDL 130.4     Comment: Optimal:  <100 mg/dLNear or Above Optimal:  100-129 mg/dLBorderline High:  130-159 mg/dLHigh:  160-189 mg/dLVery High:  >190 mg/dL          Assessment & Plan:

## 2012-12-14 ENCOUNTER — Encounter: Payer: Self-pay | Admitting: Internal Medicine

## 2012-12-14 NOTE — Assessment & Plan Note (Signed)
Has some intolerance of PM dose of metformin, does ok with AM dose.  Plan Change to Metformin XR 750 mg qAM  A1C - if elevated will add second agent.  Lab Results  Component Value Date   HGBA1C 6.7* 12/13/2012

## 2012-12-14 NOTE — Assessment & Plan Note (Signed)
Lab Results  Component Value Date   TSH 1.39 12/13/2012

## 2012-12-14 NOTE — Assessment & Plan Note (Addendum)
Interval history is w/o acute episodes or problems. Physical exam is normal. Lab results reviewed: cholesterol is borderline controlled with otherwise normal results. She is current with colorectal cancer screening and mammography. Immunizations - shingles vaccine and flu shot given today.   In summary   a pleasant woman who needs close monitoring of blood pressure.

## 2012-12-14 NOTE — Assessment & Plan Note (Signed)
Tolerated vytorin but stopped due to cost. Currently taking atorvastatin 40mg  daily.  Plan  Follow up lab with recommendations to follow.  Lab reveals LDL 130.4. Will continue present regimen.

## 2012-12-14 NOTE — Assessment & Plan Note (Signed)
BP Readings from Last 3 Encounters:  12/13/12 210/104  12/13/11 158/90  10/22/10 160/74   Blood pressure very high today (she did not take medication) and suboptimally controlled in the past on 4 drug regimen.  Plan Continue present medications  Monitor BP at home and report back so adjustments can be made in medical therapy.

## 2012-12-14 NOTE — Assessment & Plan Note (Signed)
Persistent bruits. Last study 2012.  Plan Carotid dopplers for follow up of disease.

## 2012-12-18 ENCOUNTER — Ambulatory Visit (HOSPITAL_COMMUNITY): Payer: BC Managed Care – PPO | Attending: Internal Medicine

## 2012-12-18 DIAGNOSIS — I6529 Occlusion and stenosis of unspecified carotid artery: Secondary | ICD-10-CM | POA: Insufficient documentation

## 2012-12-18 DIAGNOSIS — R0989 Other specified symptoms and signs involving the circulatory and respiratory systems: Secondary | ICD-10-CM

## 2012-12-21 ENCOUNTER — Encounter: Payer: Self-pay | Admitting: Internal Medicine

## 2012-12-24 ENCOUNTER — Encounter: Payer: Self-pay | Admitting: Internal Medicine

## 2012-12-24 ENCOUNTER — Ambulatory Visit (INDEPENDENT_AMBULATORY_CARE_PROVIDER_SITE_OTHER): Payer: BC Managed Care – PPO | Admitting: Internal Medicine

## 2012-12-24 VITALS — BP 204/110 | HR 70 | Temp 98.2°F | Wt 155.0 lb

## 2012-12-24 DIAGNOSIS — F17200 Nicotine dependence, unspecified, uncomplicated: Secondary | ICD-10-CM

## 2012-12-24 DIAGNOSIS — I10 Essential (primary) hypertension: Secondary | ICD-10-CM

## 2012-12-24 MED ORDER — CLONIDINE HCL 0.1 MG PO TABS: 0.1000 mg | ORAL_TABLET | Freq: Three times a day (TID) | ORAL | Status: DC

## 2012-12-24 NOTE — Patient Instructions (Addendum)
1) Blood pressure: Your blood pressure has been significantly elevated the last two visits. Your blood pressure today was (204/110). We added another blood pressure medication today called clonidine (0.1 mg three times a day). You will take this medication (clonidine) 3 times a day. Continue to take your other medications as prescribed (lotrel, furosemide, and metoprolol).   Please monitor your blood pressure at home as you can. We will refer you to a nephrologist (kidney doctor) to help further manage your blood pressure.   Stop by next week for a blood pressure check.   2) Testing strips: You were given a prescription for testing strips to check your glucose at home.   3) Smoking: We are thrilled that you are willing to quit at this time. Please try to reduce the amount of cigarettes you are smoking daily as much as you can tolerate. We are not going to add a nicotine patch or drug at this time until your blood pressure is better controlled.

## 2012-12-24 NOTE — Progress Notes (Signed)
Pre visit review using our clinic review tool, if applicable. No additional management support is needed unless otherwise documented below in the visit note. 

## 2012-12-24 NOTE — Progress Notes (Signed)
Subjective:     Patient ID: [name], female   DOB: [birth year], 62 y.o.   MRN: [id number]  HPI In the interim: [name] received a carotid doppler on 12/19/12, which showed 1 -39% bilateral ICA stenosis (unchanged from prior study in October 2012). 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  She is here today for follow up on her blood pressure. Her blood pressure is elevated in the office (204/110) and two weeks ago (11/20) her pressure was (210/104) in the office. She says she has been regularly taking her BP medications. She denies any headache, palpitations, syncope, or dizziness. She denies any nausea or vomiting. She says she feels "fine." She does feel a little bit nervous today about coming to the doctor since her BP was elevated last time. She walks 2.5 miles a day along with daily cleaning. She admits to eating a healthy diet. Normally she eats a diet low in salt.   PMH, FamHx and SocHx reviewed for any changes and relevance.  Current Outpatient Prescriptions on File Prior to Visit  Medication Sig Dispense Refill  . amLODipine-benazepril (LOTREL) 10-20 MG per capsule TAKE 1 CAPSULE BY MOUTH ONCE DAILY  30 capsule  10  . atorvastatin (LIPITOR) 40 MG tablet Take 1 tablet (40 mg total) by mouth daily.  30 tablet  11  . estradiol (ESTRACE) 0.5 MG tablet TAKE 1 TABLET BY MOUTH DAILY  30 tablet  8  . furosemide (LASIX) 40 MG tablet TAKE 1 TABLET (40 MG TOTAL) BY MOUTH DAILY.  30 tablet  5  . glucose blood test strip DX: 250.00 USE TO TEST BLOOD SUGAR 1-2 TIMES DAILY  100 each  3  . metFORMIN (GLUCOPHAGE-XR) 750 MG 24 hr tablet Take 1 tablet (750 mg total) by mouth daily with breakfast.  30 tablet  11  . metoprolol succinate (TOPROL-XL) 100 MG 24 hr tablet TAKE 1 TABLET EVERY DAY FOR BLOOD PRESSURE  30 tablet  10   No current facility-administered medications on file prior to visit.     Social: Patient  has been smoking for 20 years about 0.5 PPD. She had quit for 3 months in the past years ago. She is willing to quit this time. She denies any alcohol use. No illicit drug use.    Review of Systems Negative except as mentioned in HPI.      Objective:   Physical Exam HEENT: Pupils equal and reactive to light. Hearing grossly intact. Moist mucous membranes. No lymphadenopathy. No papilledema CV: Regular rate and rhythm. No murmurs, rubs or gallops.  2+ brachial and dorsalis pedis pulses. No edema Pulm: Normal work of breathing. Lungs clear to auscultation bilaterally.  Abd: Normal bowel sounds. Soft, non-tender to palpation.  Neuro: AOX3. Sensation grossly intact for light touch in lower and upper extremities. 5/5 hand grip.      Assessment:    [name] is a 62 yo female with PMH of HTN, hyperthyroidism, diabetes, and hyperlipidemia who presents today for blood pressure follow up.   1) Hypertension: Patient is currently on 4 drug regimen: amlodipine-benzapril (10-20mg ), furosemide (40 mg), and metropolol (100 mg 24 hr tab). Blood pressure has been significantly elevated at the past two visits. Advised patient to avoid salty foods and eat a balanced diet.  Will add Clonidine (0.1 mg TID) and refer her to see nephrology for further management of blood pressure.   2) Smoking cessation: Patient is willing  to quit at this time. Advised [name] to cut back on the amount of cigarettes she is smoking daily (she currently smokes 0.5 PPD).   3) Hyperlipidemia: Continue atorvastatin 40 mg daily.   4) History of hyperthyroidism: TSH last month was normal (1.39). Continue to monitor.   5) Diabetes: At last appointment, she was switched to Metformin XR 750 mg qAM. She has been unable to test her blood sugar at home recently since she ran out of testing strips. She needs different testing strips then what was prescribed for her machine. She was given a prescription for new testing strips this  visit.

## 2012-12-25 NOTE — Assessment & Plan Note (Signed)
Hypertension: Patient is currently on 4 drug regimen: amlodipine-benzapril (10-20mg ), furosemide (40 mg), and metropolol (100 mg 24 hr tab). Blood pressure has been significantly elevated at the past two visits.  Advised patient to avoid salty foods and eat a balanced diet.   Plan Will add Clonidine (0.1 mg TID)   refer her to see nephrology for further management of blood pressure.

## 2012-12-25 NOTE — Assessment & Plan Note (Signed)
Smoking cessation: Patient is willing to quit at this time. Advised Ms. Winrow to cut back on the amount of cigarettes she is smoking daily (she currently smokes 0.5 PPD).

## 2012-12-31 ENCOUNTER — Other Ambulatory Visit: Payer: Self-pay | Admitting: Internal Medicine

## 2012-12-31 ENCOUNTER — Ambulatory Visit (INDEPENDENT_AMBULATORY_CARE_PROVIDER_SITE_OTHER): Payer: BC Managed Care – PPO | Admitting: Internal Medicine

## 2012-12-31 VITALS — BP 170/80

## 2012-12-31 DIAGNOSIS — I10 Essential (primary) hypertension: Secondary | ICD-10-CM

## 2012-12-31 MED ORDER — CLONIDINE HCL 0.2 MG PO TABS: 0.2000 mg | ORAL_TABLET | Freq: Three times a day (TID) | ORAL | Status: DC

## 2012-12-31 NOTE — Patient Instructions (Signed)
Blood pressure is better but not quite right.   Plan Continue your lotrel, lasix  Increase the clonidine to 0.2 mg three times  (2x 0.1mg  until gone)  Check BP at [facility] - if still too high (greater than 150)  Diabetes - NO SUGAR, may use products sweetened with artificial sweetner.

## 2013-01-01 NOTE — Progress Notes (Signed)
   Subjective:    Patient ID: [name], female    DOB: 03-Jul-1950, 62 y.o.   MRN: [id number]  HPI [name] returns for BP check: at last visit clonidine 0.1 mg was added to her regimen. She has tolerated this well and BP is better but not at goal.  PMH, FamHx and SocHx reviewed for any changes and relevance.  Current Outpatient Prescriptions on File Prior to Visit  Medication Sig Dispense Refill  . amLODipine-benazepril (LOTREL) 10-20 MG per capsule TAKE 1 CAPSULE BY MOUTH ONCE DAILY  30 capsule  10  . atorvastatin (LIPITOR) 40 MG tablet Take 1 tablet (40 mg total) by mouth daily.  30 tablet  11  . estradiol (ESTRACE) 0.5 MG tablet TAKE 1 TABLET BY MOUTH DAILY  30 tablet  8  . furosemide (LASIX) 40 MG tablet TAKE 1 TABLET (40 MG TOTAL) BY MOUTH DAILY.  30 tablet  5  . glucose blood test strip DX: 250.00 USE TO TEST BLOOD SUGAR 1-2 TIMES DAILY  100 each  3  . metFORMIN (GLUCOPHAGE-XR) 750 MG 24 hr tablet Take 1 tablet (750 mg total) by mouth daily with breakfast.  30 tablet  11  . metoprolol succinate (TOPROL-XL) 100 MG 24 hr tablet TAKE 1 TABLET EVERY DAY FOR BLOOD PRESSURE  30 tablet  10   No current facility-administered medications on file prior to visit.      Review of Systems System review is negative for any constitutional, cardiac, pulmonary, GI or neuro symptoms or complaints other than as described in the HPI.     Objective:   Physical Exam Filed Vitals:   12/31/12 1017  BP: 170/80   gen'l- WNWD woman Cor- RRR Pulm - normal respirations Neuro - A&O x 3, normal gait.       Assessment & Plan:

## 2013-01-01 NOTE — Assessment & Plan Note (Signed)
BP Readings from Last 3 Encounters:  12/31/12 170/80  12/24/12 204/110  12/13/12 210/104   Plan Continue present medication with an increase in clonidine to 0.2 mg tid (patch too $$)

## 2013-01-02 ENCOUNTER — Other Ambulatory Visit: Payer: Self-pay | Admitting: Internal Medicine

## 2013-04-25 NOTE — Telephone Encounter (Signed)
This encounter was created in error - please disregard.

## 2013-06-11 ENCOUNTER — Ambulatory Visit (INDEPENDENT_AMBULATORY_CARE_PROVIDER_SITE_OTHER): Payer: No Typology Code available for payment source | Admitting: Internal Medicine

## 2013-06-11 ENCOUNTER — Telehealth: Payer: Self-pay | Admitting: Internal Medicine

## 2013-06-11 ENCOUNTER — Ambulatory Visit (INDEPENDENT_AMBULATORY_CARE_PROVIDER_SITE_OTHER)
Admission: RE | Admit: 2013-06-11 | Discharge: 2013-06-11 | Disposition: A | Discharge status: Home / Self Care | Payer: No Typology Code available for payment source | Source: Ambulatory Visit | Attending: Internal Medicine | Admitting: Internal Medicine

## 2013-06-11 ENCOUNTER — Encounter: Payer: Self-pay | Admitting: Internal Medicine

## 2013-06-11 ENCOUNTER — Other Ambulatory Visit (INDEPENDENT_AMBULATORY_CARE_PROVIDER_SITE_OTHER): Payer: No Typology Code available for payment source

## 2013-06-11 VITALS — BP 170/78 | HR 72 | Temp 98.7°F | Resp 14 | Wt 148.4 lb

## 2013-06-11 DIAGNOSIS — F17200 Nicotine dependence, unspecified, uncomplicated: Secondary | ICD-10-CM

## 2013-06-11 DIAGNOSIS — IMO0002 Reserved for concepts with insufficient information to code with codable children: Secondary | ICD-10-CM

## 2013-06-11 DIAGNOSIS — E1129 Type 2 diabetes mellitus with other diabetic kidney complication: Secondary | ICD-10-CM

## 2013-06-11 DIAGNOSIS — I10 Essential (primary) hypertension: Secondary | ICD-10-CM

## 2013-06-11 DIAGNOSIS — M5416 Radiculopathy, lumbar region: Secondary | ICD-10-CM

## 2013-06-11 LAB — HEMOGLOBIN A1C: Hgb A1c MFr Bld: 7.9 % — ABNORMAL HIGH (ref 4.6–6.5)

## 2013-06-11 MED ORDER — GABAPENTIN 100 MG PO CAPS: ORAL_CAPSULE | ORAL | Status: DC

## 2013-06-11 NOTE — Progress Notes (Signed)
Subjective:    Patient ID: [name], female    DOB: 1950-02-25, 63 y.o.   MRN: [id number]  HPI     She checks her blood pressure at home very rarely.Her only recorded reading was 05/30/13. This revealed blood pressure 160/88 bilaterally. She's been seeing Dr. Lorrene Reid, Nephrologist who has started her on clonidine 0.2 mg 3 times a day and most recently minoxidil 2.5 mg twice a day.  She describes minor light headaches on occasion. She states she is compliant with anti hypertemsive medications. No lightheadedness or other adverse medication effect described.  A low salt diet is followed. Exercise encompasses walking 3  times per week  without symptoms.  Family history is negative for CVA  She describes intermittent  pain in left buttocks for the last month. It was present upon awakening without any prior trigger or injury.  This is associated with tingling down the lateral aspect of the left leg & soreness down the back of the leg. She denies any associated urinary or bowel incontinence. In fact she describes constipation.  Last A1c was 6.7% in 11/14. She is not describing diabetic neuropathy symptoms in feet.    Review of Systems   Significant headaches, epistaxis, chest pain, palpitations, exertional dyspnea, claudication, paroxysmal nocturnal dyspnea, or edema absent.   She has no fever, chills, sweats, unexplained weight loss.  She has no abdominal pain, melena, or rectal bleeding.  There's no dysuria, pyuria, or hematuria.  There has been no rash or change in color or temperature of the skin although she thought the buttock area felt "hot" this week.         Objective:   Physical Exam   Gen.: Thin but adequately nourished in appearance. Alert, appropriate and cooperative throughout exam. Head: Normocephalic without obvious abnormalities  Eyes: No corneal or conjunctival inflammation noted. Pupils equal round reactive to light and accommodation. Extraocular  motion intact.  Nose: External nasal exam reveals no deformity or inflammation. Nasal mucosa are pink and moist. No lesions or exudates noted.   Mouth: Oral mucosa and oropharynx reveal no lesions or exudates. Upper partial is present;dental hygiene good. Neck: No deformities, masses, or tenderness noted. Range of motion& Thyroid normal Lungs: Normal respiratory effort; chest expands symmetrically. Lungs are clear to auscultation without rales, wheezes, or increased work of breathing. Heart: Normal rate and rhythm. Normal S1 and S2. No gallop, click, or rub. Grade 1 systolic murmur right base. Repeat blood pressure 168/88 Abdomen: Bowel sounds normal; abdomen soft and nontender. No masses, organomegaly or hernias noted.                          Musculoskeletal/extremities: No deformity or scoliosis noted of  the thoracic or lumbar spine.  No clubbing, cyanosis, edema, or significant extremity  deformity noted. Range of motion normal .Tone & strength normal. Hand joints  reveal minor isolated  DJD DIP changes.  Fingernail health good. Able to lie down & sit up w/o help. Negative SLR bilaterally Vascular: Carotid, radial artery, dorsalis pedis and  posterior tibial pulses are full and equal. No bruits present. Neurologic: Alert and oriented x3. Deep tendon reflexes symmetrical 0-1/2+ @ knees.  Gait normal  including heel & toe walking .  Skin: Intact without suspicious lesions or rashes. Lymph: No cervical, axillary lymphadenopathy present. Psych: Mood and affect are normal. Normally interactive  Assessment & Plan:  #1 poorly controlled hypertension despite polypharmacy. This is being followed by Dr. Lorrene Reid, Nephrologist. I will ask her to followup with Dr. Lorrene Reid. I emphasized stopping smoking would have a significant impact on her blood pressure. Additionally controlling the radiculopathy pain may  help.  #2 lumbosacral radiculopathy  #3 DM , ? status  See orders

## 2013-06-11 NOTE — Progress Notes (Signed)
Pre visit review using our clinic review tool, if applicable. No additional management support is needed unless otherwise documented below in the visit note. 

## 2013-06-11 NOTE — Telephone Encounter (Signed)
Relevant patient education mailed to patient.  

## 2013-06-11 NOTE — Patient Instructions (Signed)
Your next office appointment will be determined based upon review of your pending labs & x-rays. Those instructions will be transmitted to you through My Chart  OR  by mail;whichever process is your choice to receive results & recommendations .  Followup as needed for your acute issue. Please report any significant change in your symptoms. Minimal Blood Pressure Goal= AVERAGE < 140/90;  Ideal is an AVERAGE < 135/85. This AVERAGE should be calculated from @ least 5-7 BP readings taken @ different times of day on different days of week. You should not respond to isolated BP readings , but rather the AVERAGE for that week .Please bring your  blood pressure cuff to office visits to verify that it is reliable.It  can also be checked against the blood pressure device at the pharmacy. Finger or wrist cuffs are not dependable; an arm cuff is. Please think about quitting smoking. Review the risks we discussed. Please call 1-800-QUIT-NOW 5024943612) for free smoking cessation counseling.

## 2013-06-13 ENCOUNTER — Other Ambulatory Visit: Payer: Self-pay | Admitting: Internal Medicine

## 2013-06-13 DIAGNOSIS — I10 Essential (primary) hypertension: Secondary | ICD-10-CM

## 2013-06-13 DIAGNOSIS — E1159 Type 2 diabetes mellitus with other circulatory complications: Secondary | ICD-10-CM

## 2013-06-13 DIAGNOSIS — E785 Hyperlipidemia, unspecified: Secondary | ICD-10-CM

## 2013-06-14 ENCOUNTER — Telehealth: Payer: Self-pay | Admitting: Internal Medicine

## 2013-06-14 NOTE — Telephone Encounter (Signed)
Relevant patient education mailed to patient.  

## 2013-07-31 ENCOUNTER — Telehealth: Payer: Self-pay | Admitting: *Deleted

## 2013-07-31 DIAGNOSIS — E1159 Type 2 diabetes mellitus with other circulatory complications: Secondary | ICD-10-CM

## 2013-07-31 NOTE — Telephone Encounter (Signed)
Left message on machine for patient to call and schedule a follow up visit for diabetes.  Okay to schedule with a different provider for diabetes only. bmet a1c ordered Diabetic bundle

## 2013-09-13 ENCOUNTER — Inpatient Hospital Stay (HOSPITAL_COMMUNITY)
Admission: EM | Admit: 2013-09-13 | Discharge: 2013-09-15 | DRG: 194 | Disposition: A | Discharge status: Home / Self Care | Payer: No Typology Code available for payment source | Attending: Internal Medicine | Admitting: Internal Medicine

## 2013-09-13 ENCOUNTER — Encounter (HOSPITAL_COMMUNITY): Payer: Self-pay | Admitting: Emergency Medicine

## 2013-09-13 ENCOUNTER — Emergency Department (HOSPITAL_COMMUNITY): Payer: No Typology Code available for payment source

## 2013-09-13 DIAGNOSIS — I798 Other disorders of arteries, arterioles and capillaries in diseases classified elsewhere: Secondary | ICD-10-CM | POA: Diagnosis present

## 2013-09-13 DIAGNOSIS — I509 Heart failure, unspecified: Secondary | ICD-10-CM

## 2013-09-13 DIAGNOSIS — J189 Pneumonia, unspecified organism: Principal | ICD-10-CM | POA: Diagnosis present

## 2013-09-13 DIAGNOSIS — F17200 Nicotine dependence, unspecified, uncomplicated: Secondary | ICD-10-CM | POA: Diagnosis present

## 2013-09-13 DIAGNOSIS — R0600 Dyspnea, unspecified: Secondary | ICD-10-CM | POA: Diagnosis present

## 2013-09-13 DIAGNOSIS — E1159 Type 2 diabetes mellitus with other circulatory complications: Secondary | ICD-10-CM | POA: Diagnosis present

## 2013-09-13 DIAGNOSIS — G8929 Other chronic pain: Secondary | ICD-10-CM | POA: Diagnosis present

## 2013-09-13 DIAGNOSIS — R079 Chest pain, unspecified: Secondary | ICD-10-CM | POA: Diagnosis present

## 2013-09-13 DIAGNOSIS — E0590 Thyrotoxicosis, unspecified without thyrotoxic crisis or storm: Secondary | ICD-10-CM | POA: Diagnosis present

## 2013-09-13 DIAGNOSIS — R0989 Other specified symptoms and signs involving the circulatory and respiratory systems: Secondary | ICD-10-CM

## 2013-09-13 DIAGNOSIS — E785 Hyperlipidemia, unspecified: Secondary | ICD-10-CM | POA: Diagnosis present

## 2013-09-13 DIAGNOSIS — IMO0002 Reserved for concepts with insufficient information to code with codable children: Secondary | ICD-10-CM | POA: Diagnosis present

## 2013-09-13 DIAGNOSIS — E1351 Other specified diabetes mellitus with diabetic peripheral angiopathy without gangrene: Secondary | ICD-10-CM | POA: Diagnosis present

## 2013-09-13 DIAGNOSIS — I10 Essential (primary) hypertension: Secondary | ICD-10-CM | POA: Diagnosis present

## 2013-09-13 DIAGNOSIS — R0602 Shortness of breath: Secondary | ICD-10-CM

## 2013-09-13 DIAGNOSIS — R0609 Other forms of dyspnea: Secondary | ICD-10-CM

## 2013-09-13 HISTORY — DX: Gastro-esophageal reflux disease without esophagitis: K21.9

## 2013-09-13 HISTORY — DX: Shortness of breath: R06.02

## 2013-09-13 HISTORY — DX: Other chronic pain: G89.29

## 2013-09-13 HISTORY — DX: Dorsalgia, unspecified: M54.9

## 2013-09-13 HISTORY — DX: Radiculopathy, lumbar region: M54.16

## 2013-09-13 LAB — CBC WITH DIFFERENTIAL/PLATELET
Basophils Absolute: 0 10*3/uL (ref 0.0–0.1)
Basophils Relative: 0 % (ref 0–1)
EOS PCT: 1 % (ref 0–5)
Eosinophils Absolute: 0.1 10*3/uL (ref 0.0–0.7)
HEMATOCRIT: 36.6 % (ref 36.0–46.0)
HEMOGLOBIN: 12.4 g/dL (ref 12.0–15.0)
LYMPHS ABS: 1.1 10*3/uL (ref 0.7–4.0)
LYMPHS PCT: 15 % (ref 12–46)
MCH: 32 pg (ref 26.0–34.0)
MCHC: 33.9 g/dL (ref 30.0–36.0)
MCV: 94.3 fL (ref 78.0–100.0)
MONO ABS: 0.3 10*3/uL (ref 0.1–1.0)
MONOS PCT: 4 % (ref 3–12)
NEUTROS ABS: 6 10*3/uL (ref 1.7–7.7)
Neutrophils Relative %: 80 % — ABNORMAL HIGH (ref 43–77)
Platelets: 226 10*3/uL (ref 150–400)
RBC: 3.88 MIL/uL (ref 3.87–5.11)
RDW: 12.4 % (ref 11.5–15.5)
WBC: 7.5 10*3/uL (ref 4.0–10.5)

## 2013-09-13 LAB — BASIC METABOLIC PANEL
ANION GAP: 12 (ref 5–15)
BUN: 16 mg/dL (ref 6–23)
CALCIUM: 9 mg/dL (ref 8.4–10.5)
CHLORIDE: 104 meq/L (ref 96–112)
CO2: 22 mEq/L (ref 19–32)
CREATININE: 0.99 mg/dL (ref 0.50–1.10)
GFR calc Af Amer: 69 mL/min — ABNORMAL LOW (ref >90)
GFR calc non Af Amer: 59 mL/min — ABNORMAL LOW (ref >90)
GLUCOSE: 197 mg/dL — AB (ref 70–99)
Potassium: 4.5 mEq/L (ref 3.7–5.3)
Sodium: 138 mEq/L (ref 137–147)

## 2013-09-13 LAB — GLUCOSE, CAPILLARY
GLUCOSE-CAPILLARY: 324 mg/dL — AB (ref 70–99)
GLUCOSE-CAPILLARY: 379 mg/dL — AB (ref 70–99)

## 2013-09-13 LAB — TROPONIN I: Troponin I: <0.3 ng/mL (ref <0.30)

## 2013-09-13 LAB — TSH: TSH: 0.571 u[IU]/mL (ref 0.350–4.500)

## 2013-09-13 LAB — PRO B NATRIURETIC PEPTIDE: Pro B Natriuretic peptide (BNP): 2108 pg/mL — ABNORMAL HIGH (ref 0–125)

## 2013-09-13 MED ORDER — FUROSEMIDE 10 MG/ML IJ SOLN
40.0000 mg | Freq: Two times a day (BID) | INTRAMUSCULAR | Status: DC
  Administered 2013-09-13 – 2013-09-15 (×4): 40 mg via INTRAVENOUS
  Filled 2013-09-13 (×6): qty 4

## 2013-09-13 MED ORDER — CLONIDINE HCL 0.2 MG PO TABS
0.2000 mg | ORAL_TABLET | Freq: Three times a day (TID) | ORAL | Status: DC
  Administered 2013-09-13: 0.2 mg via ORAL
  Filled 2013-09-13 (×5): qty 1

## 2013-09-13 MED ORDER — ATORVASTATIN CALCIUM 40 MG PO TABS
40.0000 mg | ORAL_TABLET | Freq: Every day | ORAL | Status: DC
  Administered 2013-09-13 – 2013-09-15 (×3): 40 mg via ORAL
  Filled 2013-09-13 (×3): qty 1

## 2013-09-13 MED ORDER — ESTRADIOL 1 MG PO TABS
0.5000 mg | ORAL_TABLET | Freq: Every day | ORAL | Status: DC
  Administered 2013-09-13 – 2013-09-15 (×3): 0.5 mg via ORAL
  Filled 2013-09-13 (×3): qty 0.5

## 2013-09-13 MED ORDER — ALBUTEROL SULFATE (2.5 MG/3ML) 0.083% IN NEBU
2.5000 mg | INHALATION_SOLUTION | RESPIRATORY_TRACT | Status: DC | PRN
Start: 2013-09-13 — End: 2013-09-13

## 2013-09-13 MED ORDER — ALBUTEROL SULFATE (2.5 MG/3ML) 0.083% IN NEBU: 2.5000 mg | INHALATION_SOLUTION | RESPIRATORY_TRACT | Status: DC | PRN

## 2013-09-13 MED ORDER — ENOXAPARIN SODIUM 40 MG/0.4ML ~~LOC~~ SOLN
40.0000 mg | SUBCUTANEOUS | Status: DC
  Administered 2013-09-13 – 2013-09-14 (×2): 40 mg via SUBCUTANEOUS
  Filled 2013-09-13 (×3): qty 0.4

## 2013-09-13 MED ORDER — ONDANSETRON HCL 4 MG/2ML IJ SOLN: 4.0000 mg | Freq: Four times a day (QID) | INTRAMUSCULAR | Status: DC | PRN

## 2013-09-13 MED ORDER — ONDANSETRON HCL 4 MG PO TABS: 4.0000 mg | ORAL_TABLET | Freq: Four times a day (QID) | ORAL | Status: DC | PRN

## 2013-09-13 MED ORDER — CLONIDINE HCL 0.2 MG PO TABS
0.2000 mg | ORAL_TABLET | Freq: Once | ORAL | Status: AC
  Administered 2013-09-13: 0.2 mg via ORAL
  Filled 2013-09-13: qty 1

## 2013-09-13 MED ORDER — FUROSEMIDE 10 MG/ML IJ SOLN
40.0000 mg | Freq: Once | INTRAMUSCULAR | Status: AC
Start: 2013-09-13 — End: 2013-09-13
  Administered 2013-09-13: 40 mg via INTRAVENOUS
  Filled 2013-09-13: qty 4

## 2013-09-13 MED ORDER — ASPIRIN 81 MG PO CHEW
324.0000 mg | CHEWABLE_TABLET | Freq: Once | ORAL | Status: AC
  Administered 2013-09-13: 324 mg via ORAL
  Filled 2013-09-13: qty 4

## 2013-09-13 MED ORDER — LEVOFLOXACIN 750 MG PO TABS
750.0000 mg | ORAL_TABLET | Freq: Every day | ORAL | Status: DC
  Administered 2013-09-14: 750 mg via ORAL
  Filled 2013-09-13: qty 1

## 2013-09-13 MED ORDER — MAGNESIUM HYDROXIDE 400 MG/5ML PO SUSP
30.0000 mL | Freq: Every day | ORAL | Status: DC | PRN
  Administered 2013-09-15: 30 mL via ORAL
  Filled 2013-09-13: qty 30

## 2013-09-13 MED ORDER — ESTRADIOL 1 MG PO TABS
0.5000 mg | ORAL_TABLET | Freq: Every day | ORAL | Status: DC
Start: 2013-09-13 — End: 2013-09-13

## 2013-09-13 MED ORDER — GABAPENTIN 100 MG PO CAPS
100.0000 mg | ORAL_CAPSULE | Freq: Three times a day (TID) | ORAL | Status: DC
  Administered 2013-09-14 – 2013-09-15 (×4): 100 mg via ORAL
  Filled 2013-09-13 (×8): qty 1

## 2013-09-13 MED ORDER — METOPROLOL SUCCINATE ER 100 MG PO TB24
100.0000 mg | ORAL_TABLET | Freq: Every day | ORAL | Status: DC
  Administered 2013-09-13 – 2013-09-15 (×3): 100 mg via ORAL
  Filled 2013-09-13 (×3): qty 1

## 2013-09-13 MED ORDER — ACETAMINOPHEN 650 MG RE SUPP: 650.0000 mg | Freq: Four times a day (QID) | RECTAL | Status: DC | PRN

## 2013-09-13 MED ORDER — DOXYCYCLINE HYCLATE 100 MG PO TABS
100.0000 mg | ORAL_TABLET | Freq: Once | ORAL | Status: AC
  Administered 2013-09-13: 100 mg via ORAL
  Filled 2013-09-13: qty 1

## 2013-09-13 MED ORDER — ACETAMINOPHEN 325 MG PO TABS: 650.0000 mg | ORAL_TABLET | Freq: Four times a day (QID) | ORAL | Status: DC | PRN

## 2013-09-13 MED ORDER — DEXTROSE 5 % IV SOLN
1.0000 g | Freq: Once | INTRAVENOUS | Status: AC
  Administered 2013-09-13: 1 g via INTRAVENOUS
  Filled 2013-09-13: qty 10

## 2013-09-13 MED ORDER — METFORMIN HCL ER 750 MG PO TB24
750.0000 mg | ORAL_TABLET | Freq: Every day | ORAL | Status: DC
Start: 2013-09-14 — End: 2013-09-14
  Administered 2013-09-14: 750 mg via ORAL
  Filled 2013-09-13 (×2): qty 1

## 2013-09-13 MED ORDER — MINOXIDIL 2.5 MG PO TABS
2.5000 mg | ORAL_TABLET | Freq: Two times a day (BID) | ORAL | Status: DC
  Administered 2013-09-13: 2.5 mg via ORAL
  Filled 2013-09-13 (×3): qty 1

## 2013-09-13 MED ORDER — INSULIN ASPART 100 UNIT/ML ~~LOC~~ SOLN
0.0000 [IU] | Freq: Three times a day (TID) | SUBCUTANEOUS | Status: DC
  Administered 2013-09-13: 15 [IU] via SUBCUTANEOUS

## 2013-09-13 MED ORDER — HYDRALAZINE HCL 20 MG/ML IJ SOLN
10.0000 mg | Freq: Four times a day (QID) | INTRAMUSCULAR | Status: DC | PRN
  Administered 2013-09-14: 10 mg via INTRAVENOUS
  Filled 2013-09-13: qty 1

## 2013-09-13 MED ORDER — NICOTINE 14 MG/24HR TD PT24
14.0000 mg | MEDICATED_PATCH | TRANSDERMAL | Status: DC
  Filled 2013-09-13 (×3): qty 1

## 2013-09-13 NOTE — Progress Notes (Signed)
Pt arrived on CPAP by EMS. Pt was taken off CPAP and placed on 3 LPM nasal cannula. Pt RR 18. Pt can speak in full sentences without SOB. HR 74. BBS Clear. RT will continue to monitor.
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
## 2013-09-13 NOTE — H&P (Signed)
[facility] History and Physical  [name] [id number] DOB: Aug 23, 1950 DOA: 09/13/2013  Referring physician: Thurnell [name] PCP: Maria Hare, MD   Chief Complaint: shortness of breath  HPI: [name] is a 63 y.o. female  With h/o HTN, tobacco abuse, DM presents to ED via EMS with severe dyspnea, chest pressure which occurred while taking a rest.  Has had dyspnea, substernal chest pressure, cough productive of clear sputum, leg edema. No change in 2 pillow orthopnea. Has had PND. No palpitations. Had sore throat last week which has resolved.  BP 210 over 110, and patient had not yet taken blood pressure medications.  Received solumedrol en route.  Arrived in ED on BiPAP and converted to [place]. Troponin and EKG unremarkable. proBNP 2000. CXR shows pneumonia. Afebrile, and normal WBC in ED.   In ED, received rocephin and doxycycline, lasix. Feels much better currently without significant pain of dyspnea   Review of Systems:  Systems reviewed. As above. Otherwise negative.  Past Medical History  Diagnosis Date  . Hypertension   . History of sebaceous cyst   . Tobacco abuse   . Cough   . Hyperthyroidism   . Hyperlipemia   . Diabetes mellitus, type 2   . Chronic back pain   . Lumbar radiculopathy    Past Surgical History  Procedure Laterality Date  . Abdominal hysterectomy    . Retinopathy surgery  2013    Dr. Zigmund [name]   Social History:  reports that she has been smoking Cigarettes.  She has a 17.5 pack-year smoking history. She has never used smokeless tobacco. She reports that she drinks alcohol. She reports that she does not use illicit drugs.  No Known Allergies  Family History  Problem Relation Age of Onset  . Hypertension Mother   . Sleep apnea Mother   . Cancer Father     lung cancer - smoker  . Hypertension Sister   . Hypertension Brother   . Hypertension Sister   . Diabetes Brother      Prior to Admission medications   Medication Sig Start Date  End Date Taking? Authorizing Provider  atorvastatin (LIPITOR) 40 MG tablet Take 40 mg by mouth daily.   Yes Historical Provider, MD  cloNIDine (CATAPRES) 0.2 MG tablet Take 1 tablet (0.2 mg total) by mouth 3 (three) times daily. 12/31/12  Yes Maria Rhymes, MD  estradiol (ESTRACE) 0.5 MG tablet Take 0.5 mg by mouth daily.   Yes Historical Provider, MD  furosemide (LASIX) 40 MG tablet Take 40 mg by mouth daily.   Yes Historical Provider, MD  gabapentin (NEURONTIN) 100 MG capsule Take 100 mg by mouth every 8 (eight) hours as needed. dose may be increased by one pill each dose after 72 hours if only partially effective   Yes Historical Provider, MD  glucose blood test strip 1-2 each by Other route daily as needed for other. Use as instructed   Yes Historical Provider, MD  metFORMIN (GLUCOPHAGE-XR) 750 MG 24 hr tablet Take 1 tablet (750 mg total) by mouth daily with breakfast. 12/13/12  Yes Maria Rhymes, MD  metoprolol succinate (TOPROL-XL) 100 MG 24 hr tablet Take 100 mg by mouth daily. Take with or immediately following a meal.   Yes Historical Provider, MD  minoxidil (LONITEN) 2.5 MG tablet Take 2.5 mg by mouth 2 (two) times daily.   Yes Historical Provider, MD   Physical Exam: Filed Vitals:   09/13/13 1115 09/13/13 1145 09/13/13 1300 09/13/13 1417  BP:  195/80 187/89 193/85 205/76  Pulse: 60 61 67 71  Temp:    97.7 F (36.5 C)  TempSrc:    Oral  Resp: 16 14 18 19   SpO2: 100% 99% 99% 99%    Wt Readings from Last 3 Encounters:  06/11/13 67.314 kg (148 lb 6.4 oz)  12/24/12 70.308 kg (155 lb)  12/13/12 68.947 kg (152 lb)  BP 188/77  Pulse 65  Temp(Src) 97.7 F (36.5 C) (Oral)  Resp 19  SpO2 99%  General Appearance:    Alert, cooperative, no distress, appears stated age  Head:    Normocephalic, without obvious abnormality, atraumatic  Eyes:    PERRL, conjunctiva/corneas clear, EOM's intact, fundi    benign, both eyes     Nose:   Nares normal, septum midline, mucosa normal, no  drainage    or sinus tenderness  Throat:   Lips, mucosa, and tongue normal; teeth and gums normal  Neck:   Supple, symmetrical, trachea midline, no adenopathy;    thyroid:  no enlargement/tenderness/nodules; no carotid   bruit or JVD  Back:     Symmetric, no curvature, ROM normal, no CVA tenderness  Lungs:     Clear to auscultation bilaterally, respirations unlabored  Chest Wall:    No tenderness or deformity   Heart:    Regular rate and rhythm, S1 and S2 normal, no murmur, rub   or gallop     Abdomen:     Soft, non-tender, bowel sounds active all four quadrants,    no masses, no organomegaly  Genitalia:    deferred  Rectal:    deferred  Extremities:   Extremities normal, atraumatic, no cyanosis or edema  Pulses:   2+ and symmetric all extremities  Skin:   Skin color, texture, turgor normal, no rashes or lesions  Lymph nodes:   Cervical, supraclavicular, and axillary nodes normal  Neurologic:   CNII-XII intact, normal strength, sensation and reflexes    throughout           Psych: normal affect  Labs on Admission:  Basic Metabolic Panel:  Recent Labs Lab 09/13/13 1035  NA 138  K 4.5  CL 104  CO2 22  GLUCOSE 197*  BUN 16  CREATININE 0.99  CALCIUM 9.0   Liver Function Tests: No results found for this basename: AST, ALT, ALKPHOS, BILITOT, PROT, ALBUMIN,  in the last 168 hours No results found for this basename: LIPASE, AMYLASE,  in the last 168 hours No results found for this basename: AMMONIA,  in the last 168 hours CBC:  Recent Labs Lab 09/13/13 1035  WBC 7.5  NEUTROABS 6.0  HGB 12.4  HCT 36.6  MCV 94.3  PLT 226   Cardiac Enzymes:  Recent Labs Lab 09/13/13 1035  TROPONINI <0.30    BNP (last 3 results)  Recent Labs  09/13/13 1035  PROBNP 2108.0*   CBG: No results found for this basename: GLUCAP,  in the last 168 hours  Radiological Exams on Admission: Dg Chest Port 1 View  09/13/2013   CLINICAL DATA:  Shortness of Breath.  Cough.  EXAM:  PORTABLE CHEST - 1 VIEW  COMPARISON:  PA and lateral chest 12/13/2011 and 04/03/2009.  FINDINGS: There is patchy right basilar airspace disease and a small right effusion. The left lung appears clear. There is no pneumothorax. Cardiomegaly is noted.  IMPRESSION: Patchy right basilar airspace disease and small effusion worrisome for pneumonia.   Electronically Signed   By: Inge Rise M.D.   On:  09/13/2013 10:44    EKG: Sinus rhythm Left atrial enlargement Left axis deviation Left anterior fascicular block Left ventricular hypertrophy Anterior infarct, old T wave abnormality Lateral leads Unchanged from previous  Assessment/Plan Principal Problem:   Dyspnea:  CHF  V. Pneumonia.  No h/o CHF. Last echo in 2012 showed preserved EF and grad 1 diastolic dysfuncion. Will check echo. Bid lasix. Repeat CXR in am. If clears with diuresis, can stop antibiotics. Continue antibiotics for now. Active Problems:   Type II or unspecified type diabetes mellitus with peripheral circulatory disorders, uncontrolled(250.72): continue outpatient meds and give SSI   HYPERLIPIDEMIA   TOBACCO ABUSE, counselled against   Chest pain: r/o mi   Malignant hypertension: has not had meds today. Resume and adjust if needed.   Code Status: full Family Communication:  Disposition Plan: tele  Time spent: 69 min  [facility] Pager [phone]

## 2013-09-13 NOTE — ED Notes (Signed)
Lab notified of Troponin not being resulted, lab running troponin now.

## 2013-09-13 NOTE — ED Provider Notes (Signed)
CSN: [id number]     Arrival date & time 09/13/13  [id number] History   First MD Initiated Contact with Patient 09/13/13 [phone]     Chief Complaint  Patient presents with  . Shortness of Breath  . Chest Pain      HPI Pt was seen at 0930. Per EMS and pt report, c/o gradual onset and persistence of 2 episodes of chest "pain" since last night. Pt states she woke up last night with chest "heaviness," "tightness" and SOB. States she "got up for a while" with resolution of her symptoms. Pt states she went for her usual walk this morning (last time she went walking was approximately 1 month ago). Approximately 10 to 15 minutes into her walk she developed chest "heaviness," "tightness," SOB and diaphoresis. Pt states she was unable to continue with her walk due to her symptoms. EMS gave IV solumedrol, SL ntg x2 and started CPAP. Pt states she "feels better" on arrival to the ED. Denies any further symptoms. Denies palpitations, no cough, no back pain, no abd pain, no N/V/D. Denies hx of similar symptoms.    Past Medical History  Diagnosis Date  . Hypertension   . History of sebaceous cyst   . Tobacco abuse   . Cough   . Hyperthyroidism   . Hyperlipemia   . Diabetes mellitus, type 2   . Chronic back pain   . Lumbar radiculopathy    Past Surgical History  Procedure Laterality Date  . Abdominal hysterectomy    . Retinopathy surgery  2013    Dr. Zigmund Daniel   Family History  Problem Relation Age of Onset  . Hypertension Mother   . Sleep apnea Mother   . Cancer Father     lung cancer - smoker  . Hypertension Sister   . Hypertension Brother   . Hypertension Sister   . Diabetes Brother    History  Substance Use Topics  . Smoking status: Current Every Day Smoker -- 0.50 packs/day for 35 years    Types: Cigarettes  . Smokeless tobacco: Never Used     Comment: patient advised to keep diary, develop strategy and stop  . Alcohol Use: Yes     Comment: rare    Review of Systems ROS: Statement:  All systems negative except as marked or noted in the HPI; Constitutional: Negative for fever and chills. ; ; Eyes: Negative for eye pain, redness and discharge. ; ; ENMT: Negative for ear pain, hoarseness, nasal congestion, sinus pressure and sore throat. ; ; Cardiovascular: +CP, SOB, diaphoresis. Negative for palpitations, and peripheral edema. ; ; Respiratory: Negative for cough, wheezing and stridor. ; ; Gastrointestinal: Negative for nausea, vomiting, diarrhea, abdominal pain, blood in stool, hematemesis, jaundice and rectal bleeding. . ; ; Genitourinary: Negative for dysuria, flank pain and hematuria. ; ; Musculoskeletal: Negative for back pain and neck pain. Negative for swelling and trauma.; ; Skin: Negative for pruritus, rash, abrasions, blisters, bruising and skin lesion.; ; Neuro: Negative for headache, lightheadedness and neck stiffness. Negative for weakness, altered level of consciousness , altered mental status, extremity weakness, paresthesias, involuntary movement, seizure and syncope.      Allergies  Review of patient's allergies indicates no known allergies.  Home Medications   Prior to Admission medications   Medication Sig Start Date End Date Taking? Authorizing Provider  atorvastatin (LIPITOR) 40 MG tablet TAKE 1 TABLET (40 MG TOTAL) BY MOUTH DAILY. 01/02/13   Neena Rhymes, MD  cloNIDine (CATAPRES) 0.2 MG tablet Take  1 tablet (0.2 mg total) by mouth 3 (three) times daily. 12/31/12   Neena Rhymes, MD  estradiol (ESTRACE) 0.5 MG tablet TAKE 1 TABLET BY MOUTH DAILY 09/29/12   Neena Rhymes, MD  furosemide (LASIX) 40 MG tablet TAKE 1 TABLET (40 MG TOTAL) BY MOUTH DAILY. 09/29/12   Neena Rhymes, MD  gabapentin (NEURONTIN) 100 MG capsule One pill every eight hours as needed; dose may be increased by one pill each dose after 72 hours if only partially effective 06/11/13   Hendricks Limes, MD  glucose blood test strip DX: 250.00 USE TO TEST BLOOD SUGAR 1-2 TIMES DAILY 12/13/12    Neena Rhymes, MD  metFORMIN (GLUCOPHAGE-XR) 750 MG 24 hr tablet Take 1 tablet (750 mg total) by mouth daily with breakfast. 12/13/12   Neena Rhymes, MD  metoprolol succinate (TOPROL-XL) 100 MG 24 hr tablet TAKE 1 TABLET EVERY DAY FOR BLOOD PRESSURE 11/29/12   Neena Rhymes, MD  minoxidil (LONITEN) 2.5 MG tablet Take 2.5 mg by mouth 2 (two) times daily.    Historical Provider, MD   Pulse 74  SpO2 99% Physical Exam 0935: Physical examination:  Nursing notes reviewed; Vital signs and O2 SAT reviewed;  Constitutional: Well developed, Well nourished, Well hydrated, In no acute distress; Head:  Normocephalic, atraumatic; Eyes: EOMI, PERRL, No scleral icterus; ENMT: Mouth and pharynx normal, Mucous membranes moist; Neck: Supple, Full range of motion, No lymphadenopathy; Cardiovascular: Regular rate and rhythm, No gallop; Respiratory: Breath sounds coarse & equal bilaterally, No wheezes.  Speaking full sentences with ease, Normal respiratory effort/excursion; Chest: Nontender, Movement normal; Abdomen: Soft, Nontender, Nondistended, Normal bowel sounds; Genitourinary: No CVA tenderness; Extremities: Pulses normal, No tenderness, +1 pedal edema bilat without calf asymmetry.; Neuro: AA&Ox3, Major CN grossly intact.  Speech clear. No gross focal motor or sensory deficits in extremities.; Skin: Color normal, Warm, Dry.   ED Course  Procedures     EKG Interpretation   Date/Time:  Friday September 13 2013 09:33:42 EDT Ventricular Rate:  74 PR Interval:  170 QRS Duration: 77 QT Interval:  446 QTC Calculation: 495 R Axis:   -48 Text Interpretation:  Sinus rhythm Left atrial enlargement Left axis  deviation Left anterior fascicular block Left ventricular hypertrophy  Anterior infarct, old T wave abnormality Lateral leads When compared with  ECG of 04/09/2008 No significant change was found Confirmed by [facility]   MD, Nunzio Cory [phone]) on 09/13/2013 9:43:35 AM      MDM  MDM Reviewed: previous  chart, nursing note and vitals Reviewed previous: labs and ECG Interpretation: labs, ECG and x-ray     Results for orders placed during the hospital encounter of 09/13/13  CBC WITH DIFFERENTIAL      Result Value Ref Range   WBC 7.5  4.0 - 10.5 K/uL   RBC 3.88  3.87 - 5.11 MIL/uL   Hemoglobin 12.4  12.0 - 15.0 g/dL   HCT 36.6  36.0 - 46.0 %   MCV 94.3  78.0 - 100.0 fL   MCH 32.0  26.0 - 34.0 pg   MCHC 33.9  30.0 - 36.0 g/dL   RDW 12.4  11.5 - 15.5 %   Platelets 226  150 - 400 K/uL   Neutrophils Relative % 80 (*) 43 - 77 %   Neutro Abs 6.0  1.7 - 7.7 K/uL   Lymphocytes Relative 15  12 - 46 %   Lymphs Abs 1.1  0.7 - 4.0 K/uL   Monocytes Relative  4  3 - 12 %   Monocytes Absolute 0.3  0.1 - 1.0 K/uL   Eosinophils Relative 1  0 - 5 %   Eosinophils Absolute 0.1  0.0 - 0.7 K/uL   Basophils Relative 0  0 - 1 %   Basophils Absolute 0.0  0.0 - 0.1 K/uL  BASIC METABOLIC PANEL      Result Value Ref Range   Sodium 138  137 - 147 mEq/L   Potassium 4.5  3.7 - 5.3 mEq/L   Chloride 104  96 - 112 mEq/L   CO2 22  19 - 32 mEq/L   Glucose, Bld 197 (*) 70 - 99 mg/dL   BUN 16  6 - 23 mg/dL   Creatinine, Ser 0.99  0.50 - 1.10 mg/dL   Calcium 9.0  8.4 - 10.5 mg/dL   GFR calc non Af Amer 59 (*) >90 mL/min   GFR calc Af Amer 69 (*) >90 mL/min   Anion gap 12  5 - 15  TROPONIN I      Result Value Ref Range   Troponin I <0.30  <0.30 ng/mL  PRO B NATRIURETIC PEPTIDE      Result Value Ref Range   Pro B Natriuretic peptide (BNP) 2108.0 (*) 0 - 125 pg/mL   Dg Chest Port 1 View 09/13/2013   CLINICAL DATA:  Shortness of Breath.  Cough.  EXAM: PORTABLE CHEST - 1 VIEW  COMPARISON:  PA and lateral chest 12/13/2011 and 04/03/2009.  FINDINGS: There is patchy right basilar airspace disease and a small right effusion. The left lung appears clear. There is no pneumothorax. Cardiomegaly is noted.  IMPRESSION: Patchy right basilar airspace disease and small effusion worrisome for pneumonia.   Electronically Signed    By: Inge Rise M.D.   On: 09/13/2013 10:44    1255:  BNP elevated but no old to compare; will dose IV lasix. CXR with infiltrate; will start IV abx for CAP. Dx and testing d/w pt and family.  Questions answered.  Verb understanding, agreeable to admit. T/C to [facility]. Conley Canal, case discussed, including:  HPI, pertinent PM/SHx, VS/PE, dx testing, ED course and treatment:  Agreeable to admit, requests to write temporary orders, obtain inpt tele bed to team 10.   Francine Graven, DO 09/14/13 [phone]

## 2013-09-14 ENCOUNTER — Encounter: Payer: Self-pay | Admitting: Internal Medicine

## 2013-09-14 ENCOUNTER — Inpatient Hospital Stay (HOSPITAL_COMMUNITY): Payer: No Typology Code available for payment source

## 2013-09-14 DIAGNOSIS — F17200 Nicotine dependence, unspecified, uncomplicated: Secondary | ICD-10-CM

## 2013-09-14 DIAGNOSIS — I10 Essential (primary) hypertension: Secondary | ICD-10-CM

## 2013-09-14 DIAGNOSIS — J189 Pneumonia, unspecified organism: Principal | ICD-10-CM

## 2013-09-14 DIAGNOSIS — E1159 Type 2 diabetes mellitus with other circulatory complications: Secondary | ICD-10-CM

## 2013-09-14 DIAGNOSIS — I059 Rheumatic mitral valve disease, unspecified: Secondary | ICD-10-CM

## 2013-09-14 LAB — BASIC METABOLIC PANEL
ANION GAP: 12 (ref 5–15)
BUN: 27 mg/dL — ABNORMAL HIGH (ref 6–23)
CALCIUM: 8.4 mg/dL (ref 8.4–10.5)
CO2: 24 meq/L (ref 19–32)
Chloride: 104 mEq/L (ref 96–112)
Creatinine, Ser: 1.41 mg/dL — ABNORMAL HIGH (ref 0.50–1.10)
GFR calc Af Amer: 45 mL/min — ABNORMAL LOW (ref >90)
GFR calc non Af Amer: 39 mL/min — ABNORMAL LOW (ref >90)
Glucose, Bld: 107 mg/dL — ABNORMAL HIGH (ref 70–99)
POTASSIUM: 3.9 meq/L (ref 3.7–5.3)
SODIUM: 140 meq/L (ref 137–147)

## 2013-09-14 LAB — LIPID PANEL
CHOLESTEROL: 165 mg/dL (ref 0–200)
HDL: 64 mg/dL (ref >39)
LDL Cholesterol: 86 mg/dL (ref 0–99)
Total CHOL/HDL Ratio: 2.6 RATIO
Triglycerides: 73 mg/dL (ref <150)
VLDL: 15 mg/dL (ref 0–40)

## 2013-09-14 LAB — TROPONIN I: Troponin I: <0.3 ng/mL (ref <0.30)

## 2013-09-14 LAB — GLUCOSE, CAPILLARY
GLUCOSE-CAPILLARY: 168 mg/dL — AB (ref 70–99)
GLUCOSE-CAPILLARY: 146 mg/dL — AB (ref 70–99)
GLUCOSE-CAPILLARY: 110 mg/dL — AB (ref 70–99)
Glucose-Capillary: 143 mg/dL — ABNORMAL HIGH (ref 70–99)
Glucose-Capillary: 142 mg/dL — ABNORMAL HIGH (ref 70–99)

## 2013-09-14 LAB — HEMOGLOBIN A1C
HEMOGLOBIN A1C: 7.4 % — AB (ref <5.7)
MEAN PLASMA GLUCOSE: 166 mg/dL — AB (ref <117)

## 2013-09-14 MED ORDER — INSULIN ASPART 100 UNIT/ML ~~LOC~~ SOLN
0.0000 [IU] | SUBCUTANEOUS | Status: DC
  Administered 2013-09-14 (×2): 3 [IU] via SUBCUTANEOUS

## 2013-09-14 MED ORDER — MINOXIDIL 2.5 MG PO TABS
5.0000 mg | ORAL_TABLET | Freq: Two times a day (BID) | ORAL | Status: DC
  Administered 2013-09-14 – 2013-09-15 (×3): 5 mg via ORAL
  Filled 2013-09-14 (×4): qty 2

## 2013-09-14 MED ORDER — INSULIN ASPART 100 UNIT/ML ~~LOC~~ SOLN
0.0000 [IU] | Freq: Three times a day (TID) | SUBCUTANEOUS | Status: DC
  Administered 2013-09-15 (×2): 4 [IU] via SUBCUTANEOUS

## 2013-09-14 MED ORDER — CLONIDINE HCL 0.3 MG PO TABS
0.3000 mg | ORAL_TABLET | Freq: Three times a day (TID) | ORAL | Status: DC
  Administered 2013-09-14 – 2013-09-15 (×5): 0.3 mg via ORAL
  Filled 2013-09-14 (×7): qty 1

## 2013-09-14 NOTE — Progress Notes (Signed)
PHARMACIST - PHYSICIAN COMMUNICATION DR:  Conley Canal CONCERNING:  METFORMIN SAFE ADMINISTRATION POLICY  RECOMMENDATION: Metformin has been placed on DISCONTINUE (rejected order) STATUS and should be reordered only after any of the conditions below are ruled out.  Current safety recommendations include avoiding metformin for a minimum of 48 hours after the patient's exposure to intravenous contrast media.  DESCRIPTION:  The Pharmacy Committee has adopted a policy that restricts the use of metformin in hospitalized patients until all the contraindications to administration have been ruled out. Specific contraindications are: []  Serum creatinine ? 1.5 for males [x]  Serum creatinine ? 1.4 for females []  Shock, acute MI, sepsis, hypoxemia, dehydration []  Planned administration of intravenous iodinated contrast media []  Heart Failure patients with low EF []  Acute or chronic metabolic acidosis (including DKA)      Salome Arnt, PharmD, BCPS Pager # [phone] 09/14/2013 2:27 PM

## 2013-09-14 NOTE — Progress Notes (Signed)
[facility] PROGRESS NOTE  [name] [id number] DOB: 16-Dec-1950 DOA: 09/13/2013 PCP: Adella Hare, MD  Assessment/Plan: 1. Dyspnea 1. CXR suggestive of RLL pneumonia 2. Now on levaquin 3. Afebrile 4. On min O2 support 5. 2d echo with normal EF 2. DM 2 1. A1c of 7.4 2. On SSI coverage 3. HLD 1. On statin 4. Tobacco abuse 1. Cessation was done 5. Chest pain 1. Serial cardiac enzymes neg x 4 2. 2d echo w/o WMA 6. DVT prophylaxis 1. lovenox sub Q  Code Status: Full Family Communication: Pt and family in room (indicate person spoken with, relationship, and if by phone, the number) Disposition Plan: Pending   Consultants:    Procedures:    Antibiotics:  Levaquin 8/21>>>  HPI/Subjective: Eager to go home. Cont with mild coughing. No acute events overnight  Objective: Filed Vitals:   09/13/13 2100 09/13/13 2356 09/14/13 0524 09/14/13 1400  BP: 191/72 158/62 185/69 141/61  Pulse: 80  63 68  Temp: 97.9 F (36.6 C)  97.6 F (36.4 C) 98.2 F (36.8 C)  TempSrc: Oral  Oral Oral  Resp: 18  18 19   Height:   5\' 3"  (1.6 m)   Weight:   67.541 kg (148 lb 14.4 oz)   SpO2: 100%  96% 99%    Intake/Output Summary (Last 24 hours) at 09/14/13 1702 Last data filed at 09/14/13 [id number]  Gross per 24 hour  Intake    600 ml  Output   1250 ml  Net   -650 ml   Filed Weights   09/14/13 0524  Weight: 67.541 kg (148 lb 14.4 oz)    Exam:   General:  Awake, in nad  Cardiovascular: regular, s1, s2  Respiratory: normal resp effort, no wheezing  Abdomen: soft, nondistended  Musculoskeletal: perfused, no clubbing   Data Reviewed: Basic Metabolic Panel:  Recent Labs Lab 09/13/13 1035 09/14/13 1235  NA 138 140  K 4.5 3.9  CL 104 104  CO2 22 24  GLUCOSE 197* 107*  BUN 16 27*  CREATININE 0.99 1.41*  CALCIUM 9.0 8.4   Liver Function Tests: No results found for this basename: AST, ALT, ALKPHOS, BILITOT, PROT, ALBUMIN,  in the last 168 hours No  results found for this basename: LIPASE, AMYLASE,  in the last 168 hours No results found for this basename: AMMONIA,  in the last 168 hours CBC:  Recent Labs Lab 09/13/13 1035  WBC 7.5  NEUTROABS 6.0  HGB 12.4  HCT 36.6  MCV 94.3  PLT 226   Cardiac Enzymes:  Recent Labs Lab 09/13/13 1035 09/13/13 1650 09/13/13 2334 09/14/13 1235  TROPONINI <0.30 <0.30 <0.30 <0.30   BNP (last 3 results)  Recent Labs  09/13/13 1035  PROBNP 2108.0*   CBG:  Recent Labs Lab 09/13/13 2355 09/14/13 0523 09/14/13 0747 09/14/13 1134 09/14/13 1638  GLUCAP 324* 168* 143* 146* 110*    No results found for this or any previous visit (from the past 240 hour(s)).   Studies: Dg Chest 2 View  09/14/2013   CLINICAL DATA:  Dyspnea and chest pain.  Pneumonia versus CHF.  EXAM: CHEST  2 VIEW  COMPARISON:  Prior chest x-ray 09/13/2013  FINDINGS: Stable cardiomegaly with left ventricular prominence. Central bronchitic changes and diffuse mild interstitial prominence are similar compared to prior. Irregular patchy opacities in the right lung without significant interval change. Linear opacities in the left lower lobe are favored to reflect subsegmental atelectasis. No acute osseous abnormality. No pneumothorax or pleural effusion.  IMPRESSION: 1. Persistent patchy right lower lobe infiltrate concerning for pneumonia. 2. Linear opacities in the left lower lobe are favored to reflect atelectasis. 3. Cardiac size at upper limits of normal with prominence of the left heart.   Electronically Signed   By: Jacqulynn Cadet M.D.   On: 09/14/2013 09:11   Dg Chest Port 1 View  09/13/2013   CLINICAL DATA:  Shortness of Breath.  Cough.  EXAM: PORTABLE CHEST - 1 VIEW  COMPARISON:  PA and lateral chest 12/13/2011 and 04/03/2009.  FINDINGS: There is patchy right basilar airspace disease and a small right effusion. The left lung appears clear. There is no pneumothorax. Cardiomegaly is noted.  IMPRESSION: Patchy right  basilar airspace disease and small effusion worrisome for pneumonia.   Electronically Signed   By: Inge Rise M.D.   On: 09/13/2013 10:44    Scheduled Meds: . atorvastatin  40 mg Oral Daily  . cloNIDine  0.3 mg Oral 3 times per day  . enoxaparin (LOVENOX) injection  40 mg Subcutaneous Q24H  . estradiol  0.5 mg Oral Daily  . furosemide  40 mg Intravenous BID  . gabapentin  100 mg Oral TID  . insulin aspart  0-20 Units Subcutaneous 6 times per day  . levofloxacin  750 mg Oral Daily  . metoprolol succinate  100 mg Oral Daily  . minoxidil  5 mg Oral BID  . nicotine  14 mg Transdermal Q24H   Continuous Infusions:   Principal Problem:   Dyspnea Active Problems:   Type II or unspecified type diabetes mellitus with peripheral circulatory disorders, uncontrolled(250.72)   HYPERLIPIDEMIA   TOBACCO ABUSE   Chest pain   Malignant hypertension  Time spent: 65min  [facility], [facility] Pager [phone]. If 7PM-7AM, please contact night-coverage at [email or web address], password [facility] 09/14/2013, 5:02 PM  LOS: 1 day

## 2013-09-14 NOTE — Progress Notes (Signed)
  Echocardiogram 2D Echocardiogram has been performed.  [name] FRANCES 09/14/2013, 10:03 AM

## 2013-09-15 LAB — GLUCOSE, CAPILLARY
Glucose-Capillary: 151 mg/dL — ABNORMAL HIGH (ref 70–99)
Glucose-Capillary: 156 mg/dL — ABNORMAL HIGH (ref 70–99)

## 2013-09-15 MED ORDER — LEVOFLOXACIN 750 MG PO TABS: 750.0000 mg | ORAL_TABLET | ORAL | Status: DC

## 2013-09-15 MED ORDER — MAGNESIUM HYDROXIDE 400 MG/5ML PO SUSP: 30.0000 mL | Freq: Every day | ORAL | Status: DC | PRN

## 2013-09-15 NOTE — Discharge Instructions (Signed)
Pneumonia, Adult  Pneumonia is an infection of the lungs. It may be caused by a germ (virus or bacteria). Some types of pneumonia can spread easily from person to person. This can happen when you cough or sneeze.  HOME CARE   Only take medicine as told by your doctor.   Take your medicine (antibiotics) as told. Finish it even if you start to feel better.   Do not smoke.   You may use a vaporizer or humidifier in your room. This can help loosen thick spit (mucus).   Sleep so you are almost sitting up (semi-upright). This helps reduce coughing.   Rest.  A shot (vaccine) can help prevent pneumonia. Shots are often advised for:   People over 65 years old.   Patients on chemotherapy.   People with long-term (chronic) lung problems.   People with immune system problems.  GET HELP RIGHT AWAY IF:    You are getting worse.   You cannot control your cough, and you are losing sleep.   You cough up blood.   Your pain gets worse, even with medicine.   You have a fever.   Any of your problems are getting worse, not better.   You have shortness of breath or chest pain.  MAKE SURE YOU:    Understand these instructions.   Will watch your condition.   Will get help right away if you are not doing well or get worse.  Document Released: 06/29/2007 Document Revised: 04/04/2011 Document Reviewed: 04/02/2010  [employer] Patient Information 2015 [employer]. This information is not intended to replace advice given to you by your health care provider. Make sure you discuss any questions you have with your health care provider.

## 2013-09-15 NOTE — Progress Notes (Signed)
Nsg Discharge Note  Admit Date:  09/13/2013 Discharge date: 09/15/2013   [name] to be D/C'd Home per MD order.  AVS completed.  Copy for chart, and copy for patient signed, and dated. Patient/caregiver able to verbalize understanding.  Discharge Medication:   Medication List         atorvastatin 40 MG tablet  Commonly known as:  LIPITOR  Take 40 mg by mouth daily.     cloNIDine 0.2 MG tablet  Commonly known as:  CATAPRES  Take 1 tablet (0.2 mg total) by mouth 3 (three) times daily.     estradiol 0.5 MG tablet  Commonly known as:  ESTRACE  Take 0.5 mg by mouth daily.     furosemide 40 MG tablet  Commonly known as:  LASIX  Take 40 mg by mouth daily.     gabapentin 100 MG capsule  Commonly known as:  NEURONTIN  Take 100 mg by mouth every 8 (eight) hours as needed. dose may be increased by one pill each dose after 72 hours if only partially effective     glucose blood test strip  1-2 each by Other route daily as needed for other. Use as instructed     levofloxacin 750 MG tablet  Commonly known as:  LEVAQUIN  Take 1 tablet (750 mg total) by mouth every other day.  Start taking on:  09/16/2013     magnesium hydroxide 400 MG/5ML suspension  Commonly known as:  MILK OF MAGNESIA  Take 30 mLs by mouth daily as needed for mild constipation.     metFORMIN 750 MG 24 hr tablet  Commonly known as:  GLUCOPHAGE-XR  Take 1 tablet (750 mg total) by mouth daily with breakfast.     metoprolol succinate 100 MG 24 hr tablet  Commonly known as:  TOPROL-XL  Take 100 mg by mouth daily. Take with or immediately following a meal.     minoxidil 2.5 MG tablet  Commonly known as:  LONITEN  Take 2.5 mg by mouth 2 (two) times daily.        Discharge Assessment: Filed Vitals:   09/15/13 1042  BP: 142/68  Pulse:   Temp:   Resp:    Skin clean, dry and intact without evidence of skin break down, no evidence of skin tears noted. IV catheter discontinued intact. Site without  signs and symptoms of complications - no redness or edema noted at insertion site, patient denies c/o pain - only slight tenderness at site.  Dressing with slight pressure applied.  D/c Instructions-Education: Discharge instructions given to patient/family with verbalized understanding. D/c education completed with patient/family including follow up instructions, medication list, d/c activities limitations if indicated, with other d/c instructions as indicated by MD - patient able to verbalize understanding, all questions fully answered. Patient instructed to return to ED, call 911, or call MD for any changes in condition.  Patient escorted via [place], and D/C home via private auto.  Marline Morace, Thornell Mule, RN 09/15/2013 2:07 PM  Nsg Discharge Note  Admit Date:  09/13/2013 Discharge date: 09/15/2013   [name] to be D/C'd Home per MD order.  AVS completed.  Copy for chart, and copy for patient signed, and dated. Patient/caregiver able to verbalize understanding.  Discharge Medication:   Medication List         atorvastatin 40 MG tablet  Commonly known as:  LIPITOR  Take 40 mg by mouth daily.     cloNIDine 0.2 MG tablet  Commonly known  as:  CATAPRES  Take 1 tablet (0.2 mg total) by mouth 3 (three) times daily.     estradiol 0.5 MG tablet  Commonly known as:  ESTRACE  Take 0.5 mg by mouth daily.     furosemide 40 MG tablet  Commonly known as:  LASIX  Take 40 mg by mouth daily.     gabapentin 100 MG capsule  Commonly known as:  NEURONTIN  Take 100 mg by mouth every 8 (eight) hours as needed. dose may be increased by one pill each dose after 72 hours if only partially effective     glucose blood test strip  1-2 each by Other route daily as needed for other. Use as instructed     levofloxacin 750 MG tablet  Commonly known as:  LEVAQUIN  Take 1 tablet (750 mg total) by mouth every other day.  Start taking on:  09/16/2013     magnesium hydroxide 400 MG/5ML suspension   Commonly known as:  MILK OF MAGNESIA  Take 30 mLs by mouth daily as needed for mild constipation.     metFORMIN 750 MG 24 hr tablet  Commonly known as:  GLUCOPHAGE-XR  Take 1 tablet (750 mg total) by mouth daily with breakfast.     metoprolol succinate 100 MG 24 hr tablet  Commonly known as:  TOPROL-XL  Take 100 mg by mouth daily. Take with or immediately following a meal.     minoxidil 2.5 MG tablet  Commonly known as:  LONITEN  Take 2.5 mg by mouth 2 (two) times daily.        Discharge Assessment: Filed Vitals:   09/15/13 1042  BP: 142/68  Pulse:   Temp:   Resp:    Skin clean, dry and intact without evidence of skin break down, no evidence of skin tears noted. IV catheter discontinued intact. Site without signs and symptoms of complications - no redness or edema noted at insertion site, patient denies c/o pain - only slight tenderness at site.  Dressing with slight pressure applied.  D/c Instructions-Education: Discharge instructions given to patient/family with verbalized understanding. D/c education completed with patient/family including follow up instructions, medication list, d/c activities limitations if indicated, with other d/c instructions as indicated by MD - patient able to verbalize understanding, all questions fully answered. Patient instructed to return to ED, call 911, or call MD for any changes in condition.  Patient escorted via [place], and D/C home via private auto.  Jearld Hemp, Thornell Mule, RN 09/15/2013 2:07 PM  Nsg Discharge Note  Admit Date:  09/13/2013 Discharge date: 09/15/2013   [name] to be D/C'd Home per MD order.  AVS completed.  Copy for chart, and copy for patient signed, and dated. Patient/caregiver able to verbalize understanding.  Discharge Medication:   Medication List         atorvastatin 40 MG tablet  Commonly known as:  LIPITOR  Take 40 mg by mouth daily.     cloNIDine 0.2 MG tablet  Commonly known as:  CATAPRES  Take  1 tablet (0.2 mg total) by mouth 3 (three) times daily.     estradiol 0.5 MG tablet  Commonly known as:  ESTRACE  Take 0.5 mg by mouth daily.     furosemide 40 MG tablet  Commonly known as:  LASIX  Take 40 mg by mouth daily.     gabapentin 100 MG capsule  Commonly known as:  NEURONTIN  Take 100 mg by mouth every 8 (eight) hours as needed.  dose may be increased by one pill each dose after 72 hours if only partially effective     glucose blood test strip  1-2 each by Other route daily as needed for other. Use as instructed     levofloxacin 750 MG tablet  Commonly known as:  LEVAQUIN  Take 1 tablet (750 mg total) by mouth every other day.  Start taking on:  09/16/2013     magnesium hydroxide 400 MG/5ML suspension  Commonly known as:  MILK OF MAGNESIA  Take 30 mLs by mouth daily as needed for mild constipation.     metFORMIN 750 MG 24 hr tablet  Commonly known as:  GLUCOPHAGE-XR  Take 1 tablet (750 mg total) by mouth daily with breakfast.     metoprolol succinate 100 MG 24 hr tablet  Commonly known as:  TOPROL-XL  Take 100 mg by mouth daily. Take with or immediately following a meal.     minoxidil 2.5 MG tablet  Commonly known as:  LONITEN  Take 2.5 mg by mouth 2 (two) times daily.        Discharge Assessment: Filed Vitals:   09/15/13 1042  BP: 142/68  Pulse:   Temp:   Resp:    Skin clean, dry and intact without evidence of skin break down, no evidence of skin tears noted. IV catheter discontinued intact. Site without signs and symptoms of complications - no redness or edema noted at insertion site, patient denies c/o pain - only slight tenderness at site.  Dressing with slight pressure applied.  D/c Instructions-Education: Discharge instructions given to patient/family with verbalized understanding. D/c education completed with patient/family including follow up instructions, medication list, d/c activities limitations if indicated, with other d/c instructions as  indicated by MD - patient able to verbalize understanding, all questions fully answered. Patient instructed to return to ED, call 911, or call MD for any changes in condition.  Patient escorted via [place], and D/C home via private auto.  Vernon Maish Margaretha Sheffield, RN 09/15/2013 2:07 PM

## 2013-09-15 NOTE — Discharge Summary (Signed)
Physician Discharge Summary  [name] [id number] DOB: 1950-06-17 DOA: 09/13/2013  PCP: Adella Hare, MD  Admit date: 09/13/2013 Discharge date: 09/15/2013  Time spent: 35 minutes  Recommendations for Outpatient Follow-up:  1. Follow up with PCP in 1-2 weeks  Discharge Diagnoses:  Principal Problem:   Dyspnea Active Problems:   Type II or unspecified type diabetes mellitus with peripheral circulatory disorders, uncontrolled(250.72)   HYPERLIPIDEMIA   TOBACCO ABUSE   Chest pain   Malignant hypertension   Discharge Condition: Improved  Diet recommendation: Diabetic  Filed Weights   09/14/13 0524 09/15/13 0550  Weight: 67.541 kg (148 lb 14.4 oz) 67.5 kg (148 lb 13 oz)    History of present illness:  See admit h and p from 8/21 for details. Briefly, pt presents with increase sob, found to have airspace disease on CXR but also with an elevated BNP. Pt was admitted for further work up  Hospital Course:  1. Elevated BNP Likely no from CHF 1. 2d echo with preserved EF 2. Euvolemic 2. Dyspnea  1. Serial CXR suggestive of RLL pneumonia 2. Now on levaquin 3. Afebrile 4. On min O2 support 5. 2d echo with normal EF 3. DM 2  1. A1c of 7.4 2. On SSI coverage while inpatient 4. HLD  1. On statin 5. Tobacco abuse  1. Cessation was done 6. Chest pain  1. Serial cardiac enzymes neg x 4 2. 2d echo w/o WMA 7. DVT prophylaxis  1. lovenox sub Q while inpatient  Consultations:  None  Discharge Exam: Filed Vitals:   09/14/13 1400 09/14/13 2045 09/15/13 0550 09/15/13 1042  BP: 141/61 143/55 132/65 142/68  Pulse: 68 66 62   Temp: 98.2 F (36.8 C) 98.2 F (36.8 C) 98 F (36.7 C)   TempSrc: Oral Oral Oral   Resp: 19 18 18    Height:      Weight:   67.5 kg (148 lb 13 oz)   SpO2: 99% 99% 100%     General: Awake, in nad Cardiovascular: regular, s1, s2 Respiratory: normal resp effort, no wheezing  Discharge Instructions    Medication List         atorvastatin 40 MG tablet  Commonly known as:  LIPITOR  Take 40 mg by mouth daily.     cloNIDine 0.2 MG tablet  Commonly known as:  CATAPRES  Take 1 tablet (0.2 mg total) by mouth 3 (three) times daily.     estradiol 0.5 MG tablet  Commonly known as:  ESTRACE  Take 0.5 mg by mouth daily.     furosemide 40 MG tablet  Commonly known as:  LASIX  Take 40 mg by mouth daily.     gabapentin 100 MG capsule  Commonly known as:  NEURONTIN  Take 100 mg by mouth every 8 (eight) hours as needed. dose may be increased by one pill each dose after 72 hours if only partially effective     glucose blood test strip  1-2 each by Other route daily as needed for other. Use as instructed     levofloxacin 750 MG tablet  Commonly known as:  LEVAQUIN  Take 1 tablet (750 mg total) by mouth every other day.  Start taking on:  09/16/2013     magnesium hydroxide 400 MG/5ML suspension  Commonly known as:  MILK OF MAGNESIA  Take 30 mLs by mouth daily as needed for mild constipation.     metFORMIN 750 MG 24 hr tablet  Commonly known as:  GLUCOPHAGE-XR  Take 1 tablet (750 mg total) by mouth daily with breakfast.     metoprolol succinate 100 MG 24 hr tablet  Commonly known as:  TOPROL-XL  Take 100 mg by mouth daily. Take with or immediately following a meal.     minoxidil 2.5 MG tablet  Commonly known as:  LONITEN  Take 2.5 mg by mouth 2 (two) times daily.       No Known Allergies Follow-up Information   Follow up with Adella Hare, MD. Schedule an appointment as soon as possible for a visit in 1 week.   Specialty:  Internal Medicine       The results of significant diagnostics from this hospitalization (including imaging, microbiology, ancillary and laboratory) are listed below for reference.    Significant Diagnostic Studies: Dg Chest 2 View  09/14/2013   CLINICAL DATA:  Dyspnea and chest pain.  Pneumonia versus CHF.  EXAM: CHEST  2 VIEW  COMPARISON:  Prior chest x-ray 09/13/2013  FINDINGS:  Stable cardiomegaly with left ventricular prominence. Central bronchitic changes and diffuse mild interstitial prominence are similar compared to prior. Irregular patchy opacities in the right lung without significant interval change. Linear opacities in the left lower lobe are favored to reflect subsegmental atelectasis. No acute osseous abnormality. No pneumothorax or pleural effusion.  IMPRESSION: 1. Persistent patchy right lower lobe infiltrate concerning for pneumonia. 2. Linear opacities in the left lower lobe are favored to reflect atelectasis. 3. Cardiac size at upper limits of normal with prominence of the left heart.   Electronically Signed   By: Jacqulynn Cadet M.D.   On: 09/14/2013 09:11   Dg Chest Port 1 View  09/13/2013   CLINICAL DATA:  Shortness of Breath.  Cough.  EXAM: PORTABLE CHEST - 1 VIEW  COMPARISON:  PA and lateral chest 12/13/2011 and 04/03/2009.  FINDINGS: There is patchy right basilar airspace disease and a small right effusion. The left lung appears clear. There is no pneumothorax. Cardiomegaly is noted.  IMPRESSION: Patchy right basilar airspace disease and small effusion worrisome for pneumonia.   Electronically Signed   By: Inge Rise M.D.   On: 09/13/2013 10:44    Microbiology: No results found for this or any previous visit (from the past 240 hour(s)).   Labs: Basic Metabolic Panel:  Recent Labs Lab 09/13/13 1035 09/14/13 1235  NA 138 140  K 4.5 3.9  CL 104 104  CO2 22 24  GLUCOSE 197* 107*  BUN 16 27*  CREATININE 0.99 1.41*  CALCIUM 9.0 8.4   Liver Function Tests: No results found for this basename: AST, ALT, ALKPHOS, BILITOT, PROT, ALBUMIN,  in the last 168 hours No results found for this basename: LIPASE, AMYLASE,  in the last 168 hours No results found for this basename: AMMONIA,  in the last 168 hours CBC:  Recent Labs Lab 09/13/13 1035  WBC 7.5  NEUTROABS 6.0  HGB 12.4  HCT 36.6  MCV 94.3  PLT 226   Cardiac Enzymes:  Recent  Labs Lab 09/13/13 1035 09/13/13 1650 09/13/13 2334 09/14/13 1235  TROPONINI <0.30 <0.30 <0.30 <0.30   BNP: BNP (last 3 results)  Recent Labs  09/13/13 1035  PROBNP 2108.0*   CBG:  Recent Labs Lab 09/14/13 1134 09/14/13 1638 09/14/13 1947 09/15/13 0714 09/15/13 1128  GLUCAP 146* 110* 142* 151* 156*   Signed:  Jaron Czarnecki K  [facility] 09/15/2013, 12:55 PM

## 2013-10-31 ENCOUNTER — Other Ambulatory Visit: Payer: Self-pay

## 2014-01-10 ENCOUNTER — Telehealth: Payer: Self-pay | Admitting: Internal Medicine

## 2014-01-10 ENCOUNTER — Other Ambulatory Visit: Payer: Self-pay | Admitting: Internal Medicine

## 2014-01-10 DIAGNOSIS — E1351 Other specified diabetes mellitus with diabetic peripheral angiopathy without gangrene: Secondary | ICD-10-CM

## 2014-01-10 NOTE — Telephone Encounter (Signed)
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 Okay for medication to be filled under your name?

## 2014-01-10 NOTE — Telephone Encounter (Signed)
  Kidney function & A1c need to be performed before refill.Orders entered. It will be necessary for her to establish with a primary care physician for additional refills. I am not taking new patients at this time as I shall be retiring in 1/17.

## 2014-01-10 NOTE — Telephone Encounter (Signed)
Pt request refill for metformin to be send to [facility], advise pt that she need to establish with PCP. Please help

## 2014-01-13 NOTE — Telephone Encounter (Signed)
Phone call to patient and left a message stating Dr Linna Darner response.

## 2014-02-04 ENCOUNTER — Other Ambulatory Visit: Payer: Self-pay | Admitting: Geriatric Medicine

## 2014-02-04 MED ORDER — METFORMIN HCL ER 750 MG PO TB24: 750.0000 mg | ORAL_TABLET | Freq: Every day | ORAL | Status: DC

## 2014-06-24 ENCOUNTER — Telehealth: Payer: Self-pay | Admitting: Family

## 2014-06-24 ENCOUNTER — Encounter: Payer: Self-pay | Admitting: Family

## 2014-06-24 ENCOUNTER — Other Ambulatory Visit (INDEPENDENT_AMBULATORY_CARE_PROVIDER_SITE_OTHER): Payer: No Typology Code available for payment source

## 2014-06-24 ENCOUNTER — Ambulatory Visit (INDEPENDENT_AMBULATORY_CARE_PROVIDER_SITE_OTHER): Payer: No Typology Code available for payment source | Admitting: Family

## 2014-06-24 VITALS — BP 162/84 | HR 64 | Temp 98.3°F | Resp 18 | Ht 63.5 in | Wt 150.4 lb

## 2014-06-24 DIAGNOSIS — E1351 Other specified diabetes mellitus with diabetic peripheral angiopathy without gangrene: Secondary | ICD-10-CM

## 2014-06-24 DIAGNOSIS — Z720 Tobacco use: Secondary | ICD-10-CM | POA: Diagnosis not present

## 2014-06-24 DIAGNOSIS — E1151 Type 2 diabetes mellitus with diabetic peripheral angiopathy without gangrene: Secondary | ICD-10-CM | POA: Diagnosis not present

## 2014-06-24 DIAGNOSIS — F17200 Nicotine dependence, unspecified, uncomplicated: Secondary | ICD-10-CM

## 2014-06-24 DIAGNOSIS — I10 Essential (primary) hypertension: Secondary | ICD-10-CM

## 2014-06-24 LAB — COMPREHENSIVE METABOLIC PANEL
ALBUMIN: 3.6 g/dL (ref 3.5–5.2)
ALT: 31 U/L (ref 0–35)
AST: 21 U/L (ref 0–37)
Alkaline Phosphatase: 66 U/L (ref 39–117)
BUN: 25 mg/dL — ABNORMAL HIGH (ref 6–23)
CHLORIDE: 104 meq/L (ref 96–112)
CO2: 26 meq/L (ref 19–32)
CREATININE: 1.31 mg/dL — AB (ref 0.40–1.20)
Calcium: 9 mg/dL (ref 8.4–10.5)
GFR: 52.5 mL/min — ABNORMAL LOW (ref >60.00)
Glucose, Bld: 167 mg/dL — ABNORMAL HIGH (ref 70–99)
Potassium: 3.8 mEq/L (ref 3.5–5.1)
Sodium: 137 mEq/L (ref 135–145)
Total Bilirubin: 0.8 mg/dL (ref 0.2–1.2)
Total Protein: 6.4 g/dL (ref 6.0–8.3)

## 2014-06-24 LAB — HEMOGLOBIN A1C: Hgb A1c MFr Bld: 8.2 % — ABNORMAL HIGH (ref 4.6–6.5)

## 2014-06-24 MED ORDER — SITAGLIPTIN PHOSPHATE 50 MG PO TABS: 50.0000 mg | ORAL_TABLET | Freq: Every day | ORAL | Status: DC

## 2014-06-24 NOTE — Assessment & Plan Note (Signed)
Currently working to stop smoking. She is on Chantix to assist with her efforts. Takes the medication as prescribed and denies adverse side effects. Discussed importance of stopping smoking and relation to heart disease and stroke risk. Continue current dosage of Chantix.

## 2014-06-24 NOTE — Patient Instructions (Signed)
Thank you for choosing [facility].  Summary/Instructions:  Please continue to take your blood pressure at home.  Please continue to take your medication as prescribed.   Please stop by the lab on the basement level of the building for your blood work. Your results will be released to [place] (or called to you) after review, usually within 72 hours after test completion. If any changes need to be made, you will be notified at that same time.   DASH Eating Plan DASH stands for "Dietary Approaches to Stop Hypertension." The DASH eating plan is a healthy eating plan that has been shown to reduce high blood pressure (hypertension). Additional health benefits may include reducing the risk of type 2 diabetes mellitus, heart disease, and stroke. The DASH eating plan may also help with weight loss. WHAT DO I NEED TO KNOW ABOUT THE DASH EATING PLAN? For the DASH eating plan, you will follow these general guidelines:  Choose foods with a percent daily value for sodium of less than 5% (as listed on the food label).  Use salt-free seasonings or herbs instead of table salt or sea salt.  Check with your health care provider or pharmacist before using salt substitutes.  Eat lower-sodium products, often labeled as "lower sodium" or "no salt added."  Eat fresh foods.  Eat more vegetables, fruits, and low-fat dairy products.  Choose whole grains. Look for the word "whole" as the first word in the ingredient list.  Choose fish and skinless chicken or [place] more often than red meat. Limit fish, poultry, and meat to 6 oz (170 g) each day.  Limit sweets, desserts, sugars, and sugary drinks.  Choose heart-healthy fats.  Limit cheese to 1 oz (28 g) per day.  Eat more home-cooked food and less restaurant, buffet, and fast food.  Limit fried foods.  Cook foods using methods other than frying.  Limit canned vegetables. If you do use them, rinse them well to decrease the sodium.  When  eating at a restaurant, ask that your food be prepared with less salt, or no salt if possible. WHAT FOODS CAN I EAT? Seek help from a dietitian for individual calorie needs. Grains Whole grain or whole wheat bread. Brown rice. Whole grain or whole wheat pasta. Quinoa, bulgur, and whole grain cereals. Low-sodium cereals. Corn or whole wheat flour tortillas. Whole grain cornbread. Whole grain crackers. Low-sodium crackers. Vegetables Fresh or frozen vegetables (raw, steamed, roasted, or grilled). Low-sodium or reduced-sodium tomato and vegetable juices. Low-sodium or reduced-sodium tomato sauce and paste. Low-sodium or reduced-sodium canned vegetables.  Fruits All fresh, canned (in natural juice), or frozen fruits. Meat and Other Protein Products Ground beef (85% or leaner), grass-fed beef, or beef trimmed of fat. Skinless chicken or [place]. Ground chicken or [place]. Pork trimmed of fat. All fish and seafood. Eggs. Dried beans, peas, or lentils. Unsalted nuts and seeds. Unsalted canned beans. Dairy Low-fat dairy products, such as skim or 1% milk, 2% or reduced-fat cheeses, low-fat ricotta or cottage cheese, or plain low-fat yogurt. Low-sodium or reduced-sodium cheeses. Fats and Oils Tub margarines without trans fats. Light or reduced-fat mayonnaise and salad dressings (reduced sodium). Avocado. Safflower, olive, or canola oils. Natural peanut or almond butter. Other Unsalted popcorn and pretzels. The items listed above may not be a complete list of recommended foods or beverages. Contact your dietitian for more options. WHAT FOODS ARE NOT RECOMMENDED? Grains White bread. White pasta. White rice. Refined cornbread. Bagels and croissants. Crackers that contain trans fat. Vegetables Creamed  or fried vegetables. Vegetables in a cheese sauce. Regular canned vegetables. Regular canned tomato sauce and paste. Regular tomato and vegetable juices. Fruits Dried fruits. Canned fruit in light or heavy  syrup. Fruit juice. Meat and Other Protein Products Fatty cuts of meat. Ribs, chicken wings, bacon, sausage, bologna, salami, chitterlings, fatback, hot dogs, bratwurst, and packaged luncheon meats. Salted nuts and seeds. Canned beans with salt. Dairy Whole or 2% milk, cream, half-and-half, and cream cheese. Whole-fat or sweetened yogurt. Full-fat cheeses or blue cheese. Nondairy creamers and whipped toppings. Processed cheese, cheese spreads, or cheese curds. Condiments Onion and garlic salt, seasoned salt, table salt, and sea salt. Canned and packaged gravies. Worcestershire sauce. Tartar sauce. Barbecue sauce. Teriyaki sauce. Soy sauce, including reduced sodium. Steak sauce. Fish sauce. Oyster sauce. Cocktail sauce. Horseradish. Ketchup and mustard. Meat flavorings and tenderizers. Bouillon cubes. Hot sauce. Tabasco sauce. Marinades. Taco seasonings. Relishes. Fats and Oils Butter, stick margarine, lard, shortening, ghee, and bacon fat. Coconut, palm kernel, or palm oils. Regular salad dressings. Other Pickles and olives. Salted popcorn and pretzels. The items listed above may not be a complete list of foods and beverages to avoid. Contact your dietitian for more information. WHERE CAN I FIND MORE INFORMATION? National Heart, Lung, and Blood Institute: travelstabloid.com Document Released: 12/30/2010 Document Revised: 05/27/2013 Document Reviewed: 11/14/2012 [facility] Patient Information 2015 [place], [place]. This information is not intended to replace advice given to you by your health care provider. Make sure you discuss any questions you have with your health care provider.

## 2014-06-24 NOTE — Assessment & Plan Note (Signed)
Currently maintained on metformin and last A1c was 7.4. Takes her medication as prescribed and denies adverse side effects. Obtain A1c and complete metabolic panel. Continue current dosage of metformin pending lab results. Diabetic foot exam completed today. She is due for a diabetic eye exam which she will schedule independently. Follow-up pending lab results.

## 2014-06-24 NOTE — Assessment & Plan Note (Signed)
Blood pressure remains elevated with current regimen and above goal 140/90. Indicates there was a missed dose of medication last night and has not taken her blood pressure medications this morning. Continue current regimen of amlodipine-benazepril, clonidine, furosemide, and metoprolol at current dosages. Continue to monitor blood pressure at home. Obtain complete metabolic panel.

## 2014-06-24 NOTE — Progress Notes (Signed)
Pre visit review using our clinic review tool, if applicable. No additional management support is needed unless otherwise documented below in the visit note. 

## 2014-06-24 NOTE — Progress Notes (Signed)
Subjective:    Patient ID: [name], female    DOB: [birth year], 64 y.o.   MRN: [id number]  Chief Complaint  Patient presents with  . Establish Care    States she has been having trouble with her BP, hypertension, was told in [place] that she had 2 strokes that she was unaware of    HPI:  [name] is a 64 y.o. female with a PMH of hyperthyroidism, hyperlipidemia, hypertension, and heart murmur who presents today to establish care with this provider.   1.) Hypertension - Currently maintained amlodipine-benazepril, clonidine, furosemide, and metoprolol. She takes her medications as prescribed and denies adverse side effects. Indicates that there are some mornings that she wakes up sweating. Reports she may have missed a dose of her medication last night.    BP Readings from Last 3 Encounters:  06/24/14 162/84  09/15/13 142/68  06/11/13 170/78    2.) Tobacco Cessation - Currently maintained on Chantix and working to quit. She is smoking 4 cigarettes or less per day. Take the medications as prescribed and denies adverse side effects.  3.) Diabetes - Currently taking Metformin. Rarely takes her blood sugar at home indicating it has been a while since she last done that. Due for an eye exam which she will schedule independently. Takes her medicaiton as prescribed and denies any adverse effect.  Lab Results  Component Value Date   HGBA1C 8.2* 06/24/2014    No Known Allergies   Outpatient Prescriptions Prior to Visit  Medication Sig Dispense Refill  . atorvastatin (LIPITOR) 40 MG tablet Take 80 mg by mouth daily.     . cloNIDine (CATAPRES) 0.2 MG tablet Take 1 tablet (0.2 mg total) by mouth 3 (three) times daily. 90 tablet 11  . estradiol (ESTRACE) 0.5 MG tablet Take 0.5 mg by mouth daily.    . furosemide (LASIX) 40 MG tablet Take 40 mg by mouth daily.    . metFORMIN (GLUCOPHAGE-XR) 750 MG 24 hr tablet Take 1 tablet (750 mg total) by mouth daily with breakfast. 30  tablet 11  . metoprolol succinate (TOPROL-XL) 100 MG 24 hr tablet Take 100 mg by mouth daily. Take with or immediately following a meal.    . minoxidil (LONITEN) 2.5 MG tablet Take 2.5 mg by mouth 2 (two) times daily.    [name] glucose blood test strip 1-2 each by Other route daily as needed for other. Use as instructed    . gabapentin (NEURONTIN) 100 MG capsule Take 100 mg by mouth every 8 (eight) hours as needed. dose may be increased by one pill each dose after 72 hours if only partially effective    . levofloxacin (LEVAQUIN) 750 MG tablet Take 1 tablet (750 mg total) by mouth every other day. 3 tablet 0  . magnesium hydroxide (MILK OF MAGNESIA) 400 MG/5ML suspension Take 30 mLs by mouth daily as needed for mild constipation. 360 mL 0   No facility-administered medications prior to visit.     Past Medical History  Diagnosis Date  . Hypertension   . History of sebaceous cyst   . Tobacco abuse   . Cough   . Hyperthyroidism   . Hyperlipemia   . Diabetes mellitus, type 2   . Chronic back pain   . Lumbar radiculopathy   . Shortness of breath 09/13/2013  . GERD (gastroesophageal reflux disease)   . Stroke      Past Surgical History  Procedure Laterality Date  . Retinopathy surgery  2013  Dr. Zigmund Daniel  . Abdominal hysterectomy       Family History  Problem Relation Age of Onset  . Hypertension Mother   . Sleep apnea Mother   . Cancer Father     lung cancer - smoker  . Hypertension Sister   . Hypertension Brother   . Hypertension Sister   . Diabetes Brother      History   Social History  . Marital Status: Divorced    Spouse Name: N/A  . Number of Children: 1  . Years of Education: 11   Occupational History  . weaver    Social History Main Topics  . Smoking status: Current Every Day Smoker -- 0.50 packs/day for 35 years    Types: Cigarettes  . Smokeless tobacco: Never Used     Comment: patient advised to keep diary, develop strategy and stop  . Alcohol Use: Yes      Comment: rare  . Drug Use: No  . Sexual Activity:    Partners: Male    Birth Control/ Protection: Sponge   Other Topics Concern  . Not on file   Social History Narrative   11th Grade. Married '84- 13 years/ divorce. 1 son- '68. Sexually active-using condoms. 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 Regular Exercise- no    Review of Systems  Eyes:       Negative for changes in vision  Respiratory: Negative for chest tightness.   Cardiovascular: Negative for chest pain, palpitations and leg swelling.  Endocrine: Negative for polydipsia, polyphagia and polyuria.  Neurological: Negative for headaches.  Psychiatric/Behavioral: Negative for sleep disturbance.      Objective:    BP 162/84 mmHg  Pulse 64  Temp(Src) 98.3 F (36.8 C) (Oral)  Resp 18  Ht 5' 3.5" (1.613 m)  Wt 150 lb 6.4 oz (68.221 kg)  BMI 26.22 kg/m2  SpO2 98% Nursing note and vital signs reviewed.  Physical Exam  Constitutional: She is oriented to person, place, and time. She appears well-developed and well-nourished. No distress.  Cardiovascular: Normal rate, regular rhythm and intact distal pulses.   Murmur heard. Pulmonary/Chest: Effort normal and breath sounds normal.  Neurological: She is alert and oriented to person, place, and time.  Diabetic foot exam - bilateral feet are free from skin breakdown, cuts, and abrasions. Toenails are neatly trimmed. Pulses are intact and appropriate. Sensation is intact to monofilament bilaterally.  Skin: Skin is warm and dry.  Psychiatric: She has a normal mood and affect. Her behavior is normal. Judgment and thought content normal.       Assessment & Plan:   Problem List Items Addressed This Visit      Cardiovascular and Mediastinum   Secondary diabetes with peripheral vascular disease    Currently maintained on metformin and last A1c was 7.4. Takes her medication as prescribed and denies adverse side effects. Obtain A1c and complete metabolic panel. Continue  current dosage of metformin pending lab results. Diabetic foot exam completed today. She is due for a diabetic eye exam which she will schedule independently. Follow-up pending lab results.      Relevant Medications   amLODipine-benazepril (LOTREL) 10-40 MG per capsule   aspirin 325 MG tablet   Other Relevant Orders   Comprehensive metabolic panel (Completed)   Hemoglobin A1c   Essential hypertension - Primary    Blood pressure remains elevated with current regimen and above goal 140/90. Indicates there was a missed dose of medication last night and has not taken her blood  pressure medications this morning. Continue current regimen of amlodipine-benazepril, clonidine, furosemide, and metoprolol at current dosages. Continue to monitor blood pressure at home. Obtain complete metabolic panel.      Relevant Medications   amLODipine-benazepril (LOTREL) 10-40 MG per capsule   aspirin 325 MG tablet     Other   TOBACCO ABUSE    Currently working to stop smoking. She is on Chantix to assist with her efforts. Takes the medication as prescribed and denies adverse side effects. Discussed importance of stopping smoking and relation to heart disease and stroke risk. Continue current dosage of Chantix.      Relevant Medications   varenicline (CHANTIX) 1 MG tablet

## 2014-06-24 NOTE — Telephone Encounter (Signed)
Please inform the patient that her A1c is 8.2 indicating poorly controlled diabetes as the goal is less than 7. Therefore I would like to add another medication to her regimen called Januvia. This is a once a day medication and should help to lower her blood sugar. I would also like her to take her blood sugars in the mornings. Please have her schedule a follow up office visit 1 month after starting the Januvia.

## 2014-06-25 NOTE — Telephone Encounter (Signed)
Called pt and she is aware of the results. She states that her insurance will not cover [place]. Tried to call pharmacy and there was a long wait. Will try back.

## 2014-07-02 NOTE — Telephone Encounter (Signed)
Fax came in stating that [name] was denied.   Will cover after the following has been tried with documented tried and failed Metformin 1500mg /daily, Tredjenta/Jentadeuto or Onglyza/Kombiglyze XR.

## 2014-07-03 MED ORDER — LINAGLIPTIN 5 MG PO TABS: 5.0000 mg | ORAL_TABLET | Freq: Every day | ORAL | Status: DC

## 2014-07-03 NOTE — Telephone Encounter (Signed)
Notified pt [name] sent [facility]...[name]

## 2014-07-03 NOTE — Addendum Note (Signed)
Addended by: [name] D on: 07/03/2014 08:30 AM   Modules accepted: Orders, Medications

## 2014-07-03 NOTE — Telephone Encounter (Signed)
New medication sent to pharmacy per insurance request.

## 2014-07-04 ENCOUNTER — Encounter: Payer: Self-pay | Admitting: Family

## 2014-07-04 ENCOUNTER — Ambulatory Visit (INDEPENDENT_AMBULATORY_CARE_PROVIDER_SITE_OTHER): Payer: No Typology Code available for payment source | Admitting: Family

## 2014-07-04 VITALS — BP 160/100 | HR 69 | Temp 98.4°F | Resp 18 | Ht 63.5 in | Wt 152.0 lb

## 2014-07-04 DIAGNOSIS — R220 Localized swelling, mass and lump, head: Secondary | ICD-10-CM | POA: Insufficient documentation

## 2014-07-04 MED ORDER — PREDNISONE 10 MG PO TABS: ORAL_TABLET | ORAL | Status: DC

## 2014-07-04 NOTE — Progress Notes (Signed)
Subjective:    Patient ID: [name], female    DOB: 25-Apr-1950, 64 y.o.   MRN: [id number]  Chief Complaint  Patient presents with  . Facial Swelling    Woke up this morning with her face swollen, she states she has not used any new products or ate anything different that she knows of    HPI:  [name] is a 64 y.o. female with a PMH of diabetes, hypertension, hypothyroidism, hyperlipidemia, tobacco use, and heart murmurwho presents today for an acute office visit.  This is a new problem. Associated symptom of swelling located in her face has been going on for one day. Denies any changes to any products. Indicates she did have some fish on Monday night but she has had them before without any reactions. Denies any tongue swelling, shortness of breath or other anaphylactic symptoms. Has never had reactions to fish prior.   No Known Allergies   Current Outpatient Prescriptions on File Prior to Visit  Medication Sig Dispense Refill  . amLODipine-benazepril (LOTREL) 10-40 MG per capsule Take 1 capsule by mouth daily.    [name] aspirin 325 MG tablet Take 325 mg by mouth daily.    [name] atorvastatin (LIPITOR) 40 MG tablet Take 80 mg by mouth daily.     . cloNIDine (CATAPRES) 0.2 MG tablet Take 1 tablet (0.2 mg total) by mouth 3 (three) times daily. 90 tablet 11  . estradiol (ESTRACE) 0.5 MG tablet Take 0.5 mg by mouth daily.    . furosemide (LASIX) 40 MG tablet Take 40 mg by mouth daily.    [name] linagliptin (TRADJENTA) 5 MG TABS tablet Take 1 tablet (5 mg total) by mouth daily. 30 tablet 0  . metFORMIN (GLUCOPHAGE-XR) 750 MG 24 hr tablet Take 1 tablet (750 mg total) by mouth daily with breakfast. 30 tablet 11  . metoprolol succinate (TOPROL-XL) 100 MG 24 hr tablet Take 100 mg by mouth daily. Take with or immediately following a meal.    . minoxidil (LONITEN) 2.5 MG tablet Take 2.5 mg by mouth 2 (two) times daily.    . varenicline (CHANTIX) 1 MG tablet Take 1 mg by mouth 2 (two) times  daily.     No current facility-administered medications on file prior to visit.    Review of Systems  Constitutional: Negative for fever and chills.  HENT: Positive for facial swelling.   Skin: Negative for rash.      Objective:    BP 160/100 mmHg  Pulse 69  Temp(Src) 98.4 F (36.9 C) (Oral)  Resp 18  Ht 5' 3.5" (1.613 m)  Wt 152 lb (68.947 kg)  BMI 26.50 kg/m2  SpO2 99% Nursing note and vital signs reviewed.  Physical Exam  Constitutional: She is oriented to person, place, and time. She appears well-developed and well-nourished. No distress.  HENT:  Right Ear: Hearing, tympanic membrane, external ear and ear canal normal.  Left Ear: Hearing, tympanic membrane, external ear and ear canal normal.  Nose: Nose normal. Right sinus exhibits no maxillary sinus tenderness and no frontal sinus tenderness. Left sinus exhibits no maxillary sinus tenderness and no frontal sinus tenderness.  Mouth/Throat: Uvula is midline, oropharynx is clear and moist and mucous membranes are normal.  Mild facial edema with no rashes or redness noted.   Cardiovascular: Normal rate, regular rhythm, normal heart sounds and intact distal pulses.   Pulmonary/Chest: Effort normal and breath sounds normal.  Neurological: She is alert and oriented to person, place, and time.  Skin: Skin is warm and dry.  Psychiatric: She has a normal mood and affect. Her behavior is normal. Judgment and thought content normal.       Assessment & Plan:   Problem List Items Addressed This Visit      Other   Facial swelling - Primary    Symptoms and exam consistent with allergic reaction of undetermined source. Start Benadryl first. If this does not relieve symptoms, start prednisone taper. Follow-up if symptoms worsen or fail to improve.      Relevant Medications   predniSONE (DELTASONE) 10 MG tablet

## 2014-07-04 NOTE — Assessment & Plan Note (Signed)
Symptoms and exam consistent with allergic reaction of undetermined source. Start Benadryl first. If this does not relieve symptoms, start prednisone taper. Follow-up if symptoms worsen or fail to improve.

## 2014-07-04 NOTE — Patient Instructions (Signed)
Please take OTC Benedryl to help with your facial swelling.  If this does not work, please start the prednisone prescription.  Prednisone instructions:  4 tablets - breakfast, lunch, dinner and bedtime 3 tablets - breakfast, lunch and dinner 2 tablets - breakfast and dinner 1 tablet - breakfast  Thank you for choosing [facility].  Summary/Instructions:  Your prescription(s) have been submitted to your pharmacy or been printed and provided for you. Please take as directed and contact our office if you believe you are having problem(s) with the medication(s) or have any questions.  If your symptoms worsen or fail to improve, please contact our office for further instruction, or in case of emergency go directly to the emergency room at the closest medical facility.

## 2014-07-04 NOTE — Progress Notes (Signed)
Pre visit review using our clinic review tool, if applicable. No additional management support is needed unless otherwise documented below in the visit note. 

## 2014-07-08 ENCOUNTER — Telehealth: Payer: Self-pay

## 2014-07-08 NOTE — Telephone Encounter (Signed)
PA for Tradjenta 5 mg sent

## 2014-07-09 ENCOUNTER — Telehealth: Payer: Self-pay

## 2014-07-09 MED ORDER — METFORMIN HCL ER 750 MG PO TB24: 750.0000 mg | ORAL_TABLET | Freq: Two times a day (BID) | ORAL | Status: DC

## 2014-07-09 NOTE — Telephone Encounter (Signed)
Please have patient increase metformin to 2 pills daily which will total 1500 mg. This can be done morning and evening.

## 2014-07-09 NOTE — Telephone Encounter (Signed)
PA was done for tradjenta. Was denied due to not trying to increase the dose of metformin to 1500 a day first. Please advise on what to do

## 2014-07-10 NOTE — Telephone Encounter (Signed)
Pt aware.

## 2014-09-10 ENCOUNTER — Ambulatory Visit: Payer: No Typology Code available for payment source | Admitting: Family

## 2014-09-26 ENCOUNTER — Telehealth: Payer: Self-pay | Admitting: Family

## 2014-09-26 DIAGNOSIS — E785 Hyperlipidemia, unspecified: Secondary | ICD-10-CM

## 2014-10-07 ENCOUNTER — Encounter: Payer: Self-pay | Admitting: Family

## 2014-10-07 ENCOUNTER — Ambulatory Visit (INDEPENDENT_AMBULATORY_CARE_PROVIDER_SITE_OTHER): Payer: No Typology Code available for payment source | Admitting: Family

## 2014-10-07 VITALS — BP 224/108 | HR 62 | Temp 98.2°F | Resp 18 | Ht 63.5 in | Wt 144.8 lb

## 2014-10-07 DIAGNOSIS — R634 Abnormal weight loss: Secondary | ICD-10-CM

## 2014-10-07 DIAGNOSIS — Z23 Encounter for immunization: Secondary | ICD-10-CM | POA: Diagnosis not present

## 2014-10-07 DIAGNOSIS — I10 Essential (primary) hypertension: Secondary | ICD-10-CM | POA: Diagnosis not present

## 2014-10-07 NOTE — Assessment & Plan Note (Signed)
Hypertension remains uncontrolled with current regimen, however patient has not taken her medication yet today. Emphasized importance of a health diet - DASH information provided. Continue current dosage of clonidine, metoprolol, furosemide, minoxidil, and amlodipine-benazepril. Monitor blood pressure at home. Follow up in 2 weeks for BP evaluation following taking medications.

## 2014-10-07 NOTE — Telephone Encounter (Signed)
Last lipid was 09/14/13. Do you want pt to get blood work before refill?

## 2014-10-07 NOTE — Patient Instructions (Signed)
Thank you for choosing [facility].  Summary/Instructions:  Take your medications as prescribed.  Take your metformin 1x per day  Monitor your blood pressure at home and blood sugar.  If your symptoms worsen or fail to improve, please contact our office for further instruction, or in case of emergency go directly to the emergency room at the closest medical facility.    DASH Eating Plan DASH stands for "Dietary Approaches to Stop Hypertension." The DASH eating plan is a healthy eating plan that has been shown to reduce high blood pressure (hypertension). Additional health benefits may include reducing the risk of type 2 diabetes mellitus, heart disease, and stroke. The DASH eating plan may also help with weight loss. WHAT DO I NEED TO KNOW ABOUT THE DASH EATING PLAN? For the DASH eating plan, you will follow these general guidelines:  Choose foods with a percent daily value for sodium of less than 5% (as listed on the food label).  Use salt-free seasonings or herbs instead of table salt or sea salt.  Check with your health care provider or pharmacist before using salt substitutes.  Eat lower-sodium products, often labeled as "lower sodium" or "no salt added."  Eat fresh foods.  Eat more vegetables, fruits, and low-fat dairy products.  Choose whole grains. Look for the word "whole" as the first word in the ingredient list.  Choose fish and skinless chicken or [place] more often than red meat. Limit fish, poultry, and meat to 6 oz (170 g) each day.  Limit sweets, desserts, sugars, and sugary drinks.  Choose heart-healthy fats.  Limit cheese to 1 oz (28 g) per day.  Eat more home-cooked food and less restaurant, buffet, and fast food.  Limit fried foods.  Cook foods using methods other than frying.  Limit canned vegetables. If you do use them, rinse them well to decrease the sodium.  When eating at a restaurant, ask that your food be prepared with less salt, or no  salt if possible. WHAT FOODS CAN I EAT? Seek help from a dietitian for individual calorie needs. Grains Whole grain or whole wheat bread. Brown rice. Whole grain or whole wheat pasta. Quinoa, bulgur, and whole grain cereals. Low-sodium cereals. Corn or whole wheat flour tortillas. Whole grain cornbread. Whole grain crackers. Low-sodium crackers. Vegetables Fresh or frozen vegetables (raw, steamed, roasted, or grilled). Low-sodium or reduced-sodium tomato and vegetable juices. Low-sodium or reduced-sodium tomato sauce and paste. Low-sodium or reduced-sodium canned vegetables.  Fruits All fresh, canned (in natural juice), or frozen fruits. Meat and Other Protein Products Ground beef (85% or leaner), grass-fed beef, or beef trimmed of fat. Skinless chicken or [place]. Ground chicken or [place]. Pork trimmed of fat. All fish and seafood. Eggs. Dried beans, peas, or lentils. Unsalted nuts and seeds. Unsalted canned beans. Dairy Low-fat dairy products, such as skim or 1% milk, 2% or reduced-fat cheeses, low-fat ricotta or cottage cheese, or plain low-fat yogurt. Low-sodium or reduced-sodium cheeses. Fats and Oils Tub margarines without trans fats. Light or reduced-fat mayonnaise and salad dressings (reduced sodium). Avocado. Safflower, olive, or canola oils. Natural peanut or almond butter. Other Unsalted popcorn and pretzels. The items listed above may not be a complete list of recommended foods or beverages. Contact your dietitian for more options. WHAT FOODS ARE NOT RECOMMENDED? Grains White bread. White pasta. White rice. Refined cornbread. Bagels and croissants. Crackers that contain trans fat. Vegetables Creamed or fried vegetables. Vegetables in a cheese sauce. Regular canned vegetables. Regular canned tomato sauce and  paste. Regular tomato and vegetable juices. Fruits Dried fruits. Canned fruit in light or heavy syrup. Fruit juice. Meat and Other Protein Products Fatty cuts of meat. Ribs,  chicken wings, bacon, sausage, bologna, salami, chitterlings, fatback, hot dogs, bratwurst, and packaged luncheon meats. Salted nuts and seeds. Canned beans with salt. Dairy Whole or 2% milk, cream, half-and-half, and cream cheese. Whole-fat or sweetened yogurt. Full-fat cheeses or blue cheese. Nondairy creamers and whipped toppings. Processed cheese, cheese spreads, or cheese curds. Condiments Onion and garlic salt, seasoned salt, table salt, and sea salt. Canned and packaged gravies. Worcestershire sauce. Tartar sauce. Barbecue sauce. Teriyaki sauce. Soy sauce, including reduced sodium. Steak sauce. Fish sauce. Oyster sauce. Cocktail sauce. Horseradish. Ketchup and mustard. Meat flavorings and tenderizers. Bouillon cubes. Hot sauce. Tabasco sauce. Marinades. Taco seasonings. Relishes. Fats and Oils Butter, stick margarine, lard, shortening, ghee, and bacon fat. Coconut, palm kernel, or palm oils. Regular salad dressings. Other Pickles and olives. Salted popcorn and pretzels. The items listed above may not be a complete list of foods and beverages to avoid. Contact your dietitian for more information. WHERE CAN I FIND MORE INFORMATION? National Heart, Lung, and Blood Institute: travelstabloid.com Document Released: 12/30/2010 Document Revised: 05/27/2013 Document Reviewed: 11/14/2012 [facility] Patient Information 2015 [place], [place]. This information is not intended to replace advice given to you by your health care provider. Make sure you discuss any questions you have with your health care provider.

## 2014-10-07 NOTE — Telephone Encounter (Signed)
Pt called in and said that she forgot to ask for atorvastatin (LIPITOR) 40 MG tablet [id number]    Pharmacy -[facility]

## 2014-10-07 NOTE — Progress Notes (Signed)
Subjective:    Patient ID: [name], female    DOB: [birth year], 64 y.o.   MRN: [id number]  Chief Complaint  Patient presents with  . Follow-up    BP check and states that she has lost alot of weight since last time she was seen    HPI:  [name] is a 64 y.o. female with a PMH of hypertension, hyperthyroidism, hyperlipidemia, systolic heart murmur, tobacco use, and sebaceous cyst who presents today for a follow up office visit.    1.) Hypertension - Currently maintained on minoxidine, amlodipine-benazepril, metoprolol, clonidine, and furosemide. Inidicates that she is taking the medications with the occasional missed dose. Does note that she has been eating out a lot often.   BP Readings from Last 3 Encounters:  10/07/14 224/108  07/04/14 160/100  06/24/14 162/84    2.) Concern for weight loss - Associated symptom of weight loss that was unintentional has been going on for approximately 3 months. Denies any changes to appetite and notes approximately 5% weight loss.  Wt Readings from Last 3 Encounters:  10/07/14 144 lb 12.8 oz (65.681 kg)  07/04/14 152 lb (68.947 kg)  06/24/14 150 lb 6.4 oz (68.221 kg)    No Known Allergies   Current Outpatient Prescriptions on File Prior to Visit  Medication Sig Dispense Refill  . amLODipine-benazepril (LOTREL) 10-40 MG per capsule Take 1 capsule by mouth daily.    [name] aspirin 325 MG tablet Take 325 mg by mouth daily.    [name] atorvastatin (LIPITOR) 40 MG tablet Take 80 mg by mouth daily.     . cloNIDine (CATAPRES) 0.2 MG tablet Take 1 tablet (0.2 mg total) by mouth 3 (three) times daily. 90 tablet 11  . furosemide (LASIX) 40 MG tablet Take 40 mg by mouth daily.    . metFORMIN (GLUCOPHAGE-XR) 750 MG 24 hr tablet Take 1 tablet (750 mg total) by mouth 2 (two) times daily. 60 tablet 0  . metoprolol succinate (TOPROL-XL) 100 MG 24 hr tablet Take 100 mg by mouth daily. Take with or immediately following a meal.     No current  facility-administered medications on file prior to visit.     Review of Systems  Constitutional: Negative for fever and chills.  Eyes:       Negative for changes in vision.  Respiratory: Negative for chest tightness.   Cardiovascular: Negative for chest pain, palpitations and leg swelling.  Gastrointestinal: Negative for nausea, vomiting, diarrhea and constipation.  Neurological: Negative for headaches.      Objective:    BP 224/108 mmHg  Pulse 62  Temp(Src) 98.2 F (36.8 C) (Oral)  Resp 18  Ht 5' 3.5" (1.613 m)  Wt 144 lb 12.8 oz (65.681 kg)  BMI 25.24 kg/m2  SpO2 98% Nursing note and vital signs reviewed.  Physical Exam  Constitutional: She is oriented to person, place, and time. She appears well-developed and well-nourished. No distress.  Cardiovascular: Normal rate, regular rhythm and intact distal pulses.   Murmur heard. Pulmonary/Chest: Effort normal and breath sounds normal.  Neurological: She is alert and oriented to person, place, and time.  Skin: Skin is warm and dry.  Psychiatric: She has a normal mood and affect. Her behavior is normal. Judgment and thought content normal.       Assessment & Plan:   Problem List Items Addressed This Visit      Cardiovascular and Mediastinum   Essential hypertension - Primary    Hypertension remains uncontrolled with  current regimen, however patient has not taken her medication yet today. Emphasized importance of a health diet - DASH information provided. Continue current dosage of clonidine, metoprolol, furosemide, minoxidil, and amlodipine-benazepril. Monitor blood pressure at home. Follow up in 2 weeks for BP evaluation following taking medications.        Other   Loss of weight    Loss of weight of approximately 5% of previous weight noted. No evidence of illness. BMI remains in overweight category. Continue to monitor for additional weight loss. If noted, expand work up.        Other Visit Diagnoses    Encounter  for immunization

## 2014-10-07 NOTE — Assessment & Plan Note (Signed)
Loss of weight of approximately 5% of previous weight noted. No evidence of illness. BMI remains in overweight category. Continue to monitor for additional weight loss. If noted, expand work up.

## 2014-10-08 NOTE — Telephone Encounter (Signed)
Ok to refill - please have her come by for a lipid profile.

## 2014-10-09 MED ORDER — ATORVASTATIN CALCIUM 40 MG PO TABS: 80.0000 mg | ORAL_TABLET | Freq: Every day | ORAL | Status: DC

## 2014-10-09 NOTE — Telephone Encounter (Signed)
Called pt to let her know to get cholesterol checked before next refill.

## 2014-10-09 NOTE — Addendum Note (Signed)
Addended by: Delice Bison E on: 10/09/2014 09:42 AM   Modules accepted: Orders

## 2015-01-08 ENCOUNTER — Observation Stay (HOSPITAL_COMMUNITY): Payer: No Typology Code available for payment source

## 2015-01-08 ENCOUNTER — Inpatient Hospital Stay (HOSPITAL_COMMUNITY)
Admission: EM | Admit: 2015-01-08 | Discharge: 2015-01-10 | DRG: 065 | Disposition: A | Discharge status: Home Health | Payer: No Typology Code available for payment source | Attending: Internal Medicine | Admitting: Internal Medicine

## 2015-01-08 ENCOUNTER — Emergency Department (HOSPITAL_COMMUNITY): Payer: No Typology Code available for payment source

## 2015-01-08 ENCOUNTER — Encounter (HOSPITAL_COMMUNITY): Payer: Self-pay

## 2015-01-08 DIAGNOSIS — N179 Acute kidney failure, unspecified: Secondary | ICD-10-CM | POA: Diagnosis not present

## 2015-01-08 DIAGNOSIS — E11319 Type 2 diabetes mellitus with unspecified diabetic retinopathy without macular edema: Secondary | ICD-10-CM | POA: Diagnosis present

## 2015-01-08 DIAGNOSIS — I639 Cerebral infarction, unspecified: Principal | ICD-10-CM | POA: Diagnosis present

## 2015-01-08 DIAGNOSIS — Z794 Long term (current) use of insulin: Secondary | ICD-10-CM

## 2015-01-08 DIAGNOSIS — Z7982 Long term (current) use of aspirin: Secondary | ICD-10-CM

## 2015-01-08 DIAGNOSIS — Z79899 Other long term (current) drug therapy: Secondary | ICD-10-CM

## 2015-01-08 DIAGNOSIS — E782 Mixed hyperlipidemia: Secondary | ICD-10-CM | POA: Diagnosis present

## 2015-01-08 DIAGNOSIS — E1169 Type 2 diabetes mellitus with other specified complication: Secondary | ICD-10-CM | POA: Diagnosis present

## 2015-01-08 DIAGNOSIS — E1351 Other specified diabetes mellitus with diabetic peripheral angiopathy without gangrene: Secondary | ICD-10-CM | POA: Diagnosis present

## 2015-01-08 DIAGNOSIS — R200 Anesthesia of skin: Secondary | ICD-10-CM

## 2015-01-08 DIAGNOSIS — I160 Hypertensive urgency: Secondary | ICD-10-CM | POA: Diagnosis present

## 2015-01-08 DIAGNOSIS — Z8673 Personal history of transient ischemic attack (TIA), and cerebral infarction without residual deficits: Secondary | ICD-10-CM

## 2015-01-08 DIAGNOSIS — D638 Anemia in other chronic diseases classified elsewhere: Secondary | ICD-10-CM | POA: Diagnosis present

## 2015-01-08 DIAGNOSIS — E1122 Type 2 diabetes mellitus with diabetic chronic kidney disease: Secondary | ICD-10-CM | POA: Diagnosis present

## 2015-01-08 DIAGNOSIS — E1165 Type 2 diabetes mellitus with hyperglycemia: Secondary | ICD-10-CM | POA: Diagnosis present

## 2015-01-08 DIAGNOSIS — Z7984 Long term (current) use of oral hypoglycemic drugs: Secondary | ICD-10-CM

## 2015-01-08 DIAGNOSIS — I129 Hypertensive chronic kidney disease with stage 1 through stage 4 chronic kidney disease, or unspecified chronic kidney disease: Secondary | ICD-10-CM | POA: Diagnosis present

## 2015-01-08 DIAGNOSIS — E1151 Type 2 diabetes mellitus with diabetic peripheral angiopathy without gangrene: Secondary | ICD-10-CM | POA: Diagnosis present

## 2015-01-08 DIAGNOSIS — N183 Chronic kidney disease, stage 3 (moderate): Secondary | ICD-10-CM | POA: Diagnosis present

## 2015-01-08 DIAGNOSIS — F17210 Nicotine dependence, cigarettes, uncomplicated: Secondary | ICD-10-CM | POA: Diagnosis present

## 2015-01-08 DIAGNOSIS — E785 Hyperlipidemia, unspecified: Secondary | ICD-10-CM | POA: Diagnosis present

## 2015-01-08 DIAGNOSIS — E1140 Type 2 diabetes mellitus with diabetic neuropathy, unspecified: Secondary | ICD-10-CM | POA: Diagnosis present

## 2015-01-08 DIAGNOSIS — I10 Essential (primary) hypertension: Secondary | ICD-10-CM | POA: Insufficient documentation

## 2015-01-08 DIAGNOSIS — R110 Nausea: Secondary | ICD-10-CM | POA: Diagnosis not present

## 2015-01-08 DIAGNOSIS — D649 Anemia, unspecified: Secondary | ICD-10-CM | POA: Diagnosis present

## 2015-01-08 DIAGNOSIS — E871 Hypo-osmolality and hyponatremia: Secondary | ICD-10-CM | POA: Diagnosis present

## 2015-01-08 HISTORY — DX: Acute kidney failure, unspecified: N17.9

## 2015-01-08 LAB — BASIC METABOLIC PANEL
ANION GAP: 9 (ref 5–15)
BUN: 48 mg/dL — ABNORMAL HIGH (ref 6–20)
CALCIUM: 8.6 mg/dL — AB (ref 8.9–10.3)
CO2: 22 mmol/L (ref 22–32)
Chloride: 101 mmol/L (ref 101–111)
Creatinine, Ser: 2.24 mg/dL — ABNORMAL HIGH (ref 0.44–1.00)
GFR calc Af Amer: 25 mL/min — ABNORMAL LOW (ref >60)
GFR, EST NON AFRICAN AMERICAN: 22 mL/min — AB (ref >60)
Glucose, Bld: 259 mg/dL — ABNORMAL HIGH (ref 65–99)
POTASSIUM: 4.4 mmol/L (ref 3.5–5.1)
SODIUM: 132 mmol/L — AB (ref 135–145)

## 2015-01-08 LAB — CBC WITH DIFFERENTIAL/PLATELET
BASOS ABS: 0 10*3/uL (ref 0.0–0.1)
BASOS PCT: 1 %
EOS PCT: 4 %
Eosinophils Absolute: 0.2 10*3/uL (ref 0.0–0.7)
HCT: 28.9 % — ABNORMAL LOW (ref 36.0–46.0)
Hemoglobin: 9.7 g/dL — ABNORMAL LOW (ref 12.0–15.0)
LYMPHS PCT: 35 %
Lymphs Abs: 1.6 10*3/uL (ref 0.7–4.0)
MCH: 29.8 pg (ref 26.0–34.0)
MCHC: 33.6 g/dL (ref 30.0–36.0)
MCV: 88.9 fL (ref 78.0–100.0)
MONO ABS: 0.3 10*3/uL (ref 0.1–1.0)
Monocytes Relative: 8 %
NEUTROS ABS: 2.4 10*3/uL (ref 1.7–7.7)
Neutrophils Relative %: 52 %
PLATELETS: 207 10*3/uL (ref 150–400)
RBC: 3.25 MIL/uL — AB (ref 3.87–5.11)
RDW: 12.1 % (ref 11.5–15.5)
WBC: 4.5 10*3/uL (ref 4.0–10.5)

## 2015-01-08 LAB — LIPID PANEL
CHOL/HDL RATIO: 5 ratio
Cholesterol: 145 mg/dL (ref 0–200)
HDL: 29 mg/dL — ABNORMAL LOW (ref >40)
LDL CALC: 90 mg/dL (ref 0–99)
Triglycerides: 129 mg/dL (ref <150)
VLDL: 26 mg/dL (ref 0–40)

## 2015-01-08 LAB — URINALYSIS, ROUTINE W REFLEX MICROSCOPIC
Bilirubin Urine: NEGATIVE
Glucose, UA: 250 mg/dL — AB
Hgb urine dipstick: NEGATIVE
KETONES UR: NEGATIVE mg/dL
LEUKOCYTES UA: NEGATIVE
NITRITE: NEGATIVE
PH: 5 (ref 5.0–8.0)
PROTEIN: 30 mg/dL — AB
Specific Gravity, Urine: 1.012 (ref 1.005–1.030)

## 2015-01-08 LAB — POC OCCULT BLOOD, ED: FECAL OCCULT BLD: NEGATIVE

## 2015-01-08 LAB — GLUCOSE, CAPILLARY: Glucose-Capillary: 238 mg/dL — ABNORMAL HIGH (ref 65–99)

## 2015-01-08 LAB — URINE MICROSCOPIC-ADD ON

## 2015-01-08 LAB — FOLATE: FOLATE: 13.1 ng/mL (ref >5.9)

## 2015-01-08 LAB — IRON AND TIBC
Iron: 45 ug/dL (ref 28–170)
SATURATION RATIOS: 22 % (ref 10.4–31.8)
TIBC: 206 ug/dL — AB (ref 250–450)
UIBC: 161 ug/dL

## 2015-01-08 LAB — RETICULOCYTES
RBC.: 3.29 MIL/uL — AB (ref 3.87–5.11)
Retic Count, Absolute: 46.1 10*3/uL (ref 19.0–186.0)
Retic Ct Pct: 1.4 % (ref 0.4–3.1)

## 2015-01-08 LAB — VITAMIN B12: VITAMIN B 12: 584 pg/mL (ref 180–914)

## 2015-01-08 LAB — FERRITIN: Ferritin: 144 ng/mL (ref 11–307)

## 2015-01-08 MED ORDER — ATORVASTATIN CALCIUM 80 MG PO TABS
80.0000 mg | ORAL_TABLET | Freq: Every day | ORAL | Status: DC
  Administered 2015-01-09 – 2015-01-10 (×2): 80 mg via ORAL
  Filled 2015-01-08 (×2): qty 1

## 2015-01-08 MED ORDER — SENNA 8.6 MG PO TABS
1.0000 | ORAL_TABLET | Freq: Two times a day (BID) | ORAL | Status: DC
  Administered 2015-01-08 – 2015-01-09 (×3): 8.6 mg via ORAL
  Filled 2015-01-08 (×3): qty 1

## 2015-01-08 MED ORDER — ENOXAPARIN SODIUM 30 MG/0.3ML ~~LOC~~ SOLN
30.0000 mg | SUBCUTANEOUS | Status: DC
  Administered 2015-01-08 – 2015-01-09 (×2): 30 mg via SUBCUTANEOUS
  Filled 2015-01-08 (×2): qty 0.3

## 2015-01-08 MED ORDER — ASPIRIN EC 81 MG PO TBEC: 81.0000 mg | DELAYED_RELEASE_TABLET | Freq: Every day | ORAL | Status: DC

## 2015-01-08 MED ORDER — INSULIN ASPART 100 UNIT/ML ~~LOC~~ SOLN
0.0000 [IU] | Freq: Three times a day (TID) | SUBCUTANEOUS | Status: DC
  Administered 2015-01-09 (×2): 3 [IU] via SUBCUTANEOUS
  Administered 2015-01-10 (×2): 2 [IU] via SUBCUTANEOUS

## 2015-01-08 MED ORDER — STROKE: EARLY STAGES OF RECOVERY BOOK: Freq: Once | Status: DC

## 2015-01-08 MED ORDER — ASPIRIN 325 MG PO TABS
325.0000 mg | ORAL_TABLET | Freq: Every day | ORAL | Status: DC
  Administered 2015-01-09 – 2015-01-10 (×2): 325 mg via ORAL
  Filled 2015-01-08 (×2): qty 1

## 2015-01-08 MED ORDER — SODIUM CHLORIDE 0.9 % IV SOLN
INTRAVENOUS | Status: DC
  Administered 2015-01-08 – 2015-01-09 (×2): via INTRAVENOUS

## 2015-01-08 MED ORDER — ASPIRIN 325 MG PO TABS: 325.0000 mg | ORAL_TABLET | Freq: Every day | ORAL | Status: DC

## 2015-01-08 MED ORDER — SODIUM CHLORIDE 0.9 % IV BOLUS (SEPSIS)
1000.0000 mL | Freq: Once | INTRAVENOUS | Status: AC
  Administered 2015-01-08: 1000 mL via INTRAVENOUS

## 2015-01-08 MED ORDER — ACETAMINOPHEN 650 MG RE SUPP: 650.0000 mg | RECTAL | Status: DC | PRN

## 2015-01-08 MED ORDER — ACETAMINOPHEN 325 MG PO TABS
650.0000 mg | ORAL_TABLET | ORAL | Status: DC | PRN
  Administered 2015-01-09: 650 mg via ORAL
  Filled 2015-01-08: qty 2

## 2015-01-08 MED ORDER — SODIUM CHLORIDE 0.9 % IV SOLN: INTRAVENOUS | Status: DC

## 2015-01-08 MED ORDER — SENNOSIDES-DOCUSATE SODIUM 8.6-50 MG PO TABS: 1.0000 | ORAL_TABLET | Freq: Every evening | ORAL | Status: DC | PRN

## 2015-01-08 MED ORDER — DOCUSATE SODIUM 100 MG PO CAPS
100.0000 mg | ORAL_CAPSULE | Freq: Two times a day (BID) | ORAL | Status: DC
  Administered 2015-01-08 – 2015-01-10 (×4): 100 mg via ORAL
  Filled 2015-01-08 (×4): qty 1

## 2015-01-08 MED ORDER — INSULIN ASPART 100 UNIT/ML ~~LOC~~ SOLN
0.0000 [IU] | Freq: Every day | SUBCUTANEOUS | Status: DC
  Administered 2015-01-08: 2 [IU] via SUBCUTANEOUS
  Administered 2015-01-09: 5 [IU] via SUBCUTANEOUS

## 2015-01-08 NOTE — Consult Note (Signed)
Referring Physician: ED    Chief Complaint: right foot numbness, imbalance, ischemic stroke on MRI  HPI:                                                                                                                                         [name] is an 64 y.o. 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 She stated that she ran out of her medications 3-4 days ago, and before yesterday she woke up " staggering and with poor body control" and subsequently noticed a numb/tingly sensation in her right foot. Denies associated weakness of the right leg, no numbness-tingling-weakness right arm. Further, no HA, vertigo, double vision, slurred speech, language or vision impairment. [name] said that the right foot numbness is going away but now is " more noticeable in the left foot". MRI brain was personally reviewed and showed a small acute infarct involving the left lateral geniculate body.   Date last known well: 01/06/15 Time last known well: unable to deteermine tPA Given: no, late presentation   Past Medical History  Diagnosis Date  . Hypertension   . History of sebaceous cyst   . Tobacco abuse   . Cough   . Hyperthyroidism   . Hyperlipemia   . Diabetes mellitus, type 2 ([place])   . Chronic back pain   . Lumbar radiculopathy   . Shortness of breath 09/13/2013  . GERD (gastroesophageal reflux disease)   . Stroke [facility])     Past Surgical History  Procedure Laterality Date  . Retinopathy surgery  2013    Dr. Zigmund Daniel  . Abdominal hysterectomy      Family History  Problem Relation Age of Onset  . Hypertension Mother   . Sleep apnea Mother   . Cancer Father     lung cancer - smoker  . Hypertension Sister   . Hypertension Brother   . Hypertension Sister   . Diabetes Brother    Social History:  reports  that she has been smoking Cigarettes.  She has a 17.5 pack-year smoking history. She has never used smokeless tobacco. She reports that she drinks alcohol. She reports that she does not use illicit drugs. Family history: no MS, epilepsy, or brain tumor Allergies: No Known Allergies  Medications:  I have reviewed the patient's current medications.  ROS:                                                                                                                                       History obtained from chart review and the patient  General ROS: negative for - chills, fatigue, fever, night sweats, weight gain or weight loss Psychological ROS: negative for - behavioral disorder, hallucinations, memory difficulties, mood swings or suicidal ideation Ophthalmic ROS: negative for - blurry vision, double vision, eye pain or loss of vision ENT ROS: negative for - epistaxis, nasal discharge, oral lesions, sore throat, tinnitus or vertigo Allergy and Immunology ROS: negative for - hives or itchy/watery eyes Hematological and Lymphatic ROS: negative for - bleeding problems, bruising or swollen lymph nodes Endocrine ROS: negative for - galactorrhea, hair pattern changes, polydipsia/polyuria or temperature intolerance Respiratory ROS: negative for - cough, hemoptysis, shortness of breath or wheezing Cardiovascular ROS: negative for - chest pain, dyspnea on exertion, edema or irregular heartbeat Gastrointestinal ROS: negative for - abdominal pain, diarrhea, hematemesis, nausea/vomiting or stool incontinence Genito-Urinary ROS: negative for - dysuria, hematuria, incontinence or urinary frequency/urgency Musculoskeletal ROS: negative for - joint swelling or muscular weakness Neurological ROS: as noted in HPI Dermatological ROS: negative for rash and skin lesion changes    Physical  exam:  Constitutional: well developed, pleasant female in no apparent distress. Blood pressure 144/67, pulse 78, temperature 98.8 F (37.1 C), temperature source Oral, resp. rate 17, height '5\' 4"'$  (1.626 m), weight 67.586 kg (149 lb), SpO2 93 %. Eyes: no jaundice or exophthalmos.  Head: normocephalic. Neck: supple, no bruits, no JVD. Cardiac: no murmurs. Lungs: clear. Abdomen: soft, no tender, no mass. Extremities: no edema, clubbing, or cyanosis.  Skin: no rash  Neurologic Examination:                                                                                                      General: NAD Mental Status: Alert, oriented, thought content appropriate.  Speech fluent without evidence of aphasia.  Able to follow 3 step commands without difficulty. Cranial Nerves: II: Discs flat bilaterally; Visual fields grossly normal, pupils equal, round, reactive to light and accommodation III,IV, VI: ptosis not present, extra-ocular motions intact bilaterally V,VII: smile symmetric, facial light touch sensation normal bilaterally VIII: hearing normal bilaterally IX,X: uvula rises symmetrically XI: bilateral shoulder shrug XII: midline tongue extension without atrophy or fasciculations  Motor: Right : Upper extremity   5/5  Left:     Upper extremity   5/5  Lower extremity   5/5     Lower extremity   5/5 Tone and bulk:normal tone throughout; no atrophy noted Sensory: Pinprick and light touch intact throughout, bilaterally Deep Tendon Reflexes:  Right: Upper Extremity   Left: Upper extremity   biceps (C-5 to C-6) 2/4   biceps (C-5 to C-6) 2/4 tricep (C7) 2/4    triceps (C7) 2/4 Brachioradialis (C6) 2/4  Brachioradialis (C6) 2/4  Lower Extremity Lower Extremity  quadriceps (L-2 to L-4) 2/4   quadriceps (L-2 to L-4) 2/4 Achilles (S1) 2/4   Achilles (S1) 2/4  Plantars: Right: downgoing   Left: downgoing Cerebellar: normal finger-to-nose,  normal heel-to-shin test Gait:  No tested due  to multiple leads    Results for orders placed or performed during the hospital encounter of 01/08/15 (from the past 48 hour(s))  Basic metabolic panel     Status: Abnormal   Collection Time: 01/08/15 12:33 PM  Result Value Ref Range   Sodium 132 (L) 135 - 145 mmol/L   Potassium 4.4 3.5 - 5.1 mmol/L   Chloride 101 101 - 111 mmol/L   CO2 22 22 - 32 mmol/L   Glucose, Bld 259 (H) 65 - 99 mg/dL   BUN 48 (H) 6 - 20 mg/dL   Creatinine, Ser 2.24 (H) 0.44 - 1.00 mg/dL   Calcium 8.6 (L) 8.9 - 10.3 mg/dL   GFR calc non Af Amer 22 (L) >60 mL/min   GFR calc Af Amer 25 (L) >60 mL/min    Comment: (NOTE) The eGFR has been calculated using the CKD EPI equation. This calculation has not been validated in all clinical situations. eGFR's persistently <60 mL/min signify possible Chronic Kidney Disease.    Anion gap 9 5 - 15  CBC with Differential     Status: Abnormal   Collection Time: 01/08/15 12:33 PM  Result Value Ref Range   WBC 4.5 4.0 - 10.5 K/uL   RBC 3.25 (L) 3.87 - 5.11 MIL/uL   Hemoglobin 9.7 (L) 12.0 - 15.0 g/dL   HCT 28.9 (L) 36.0 - 46.0 %   MCV 88.9 78.0 - 100.0 fL   MCH 29.8 26.0 - 34.0 pg   MCHC 33.6 30.0 - 36.0 g/dL   RDW 12.1 11.5 - 15.5 %   Platelets 207 150 - 400 K/uL   Neutrophils Relative % 52 %   Neutro Abs 2.4 1.7 - 7.7 K/uL   Lymphocytes Relative 35 %   Lymphs Abs 1.6 0.7 - 4.0 K/uL   Monocytes Relative 8 %   Monocytes Absolute 0.3 0.1 - 1.0 K/uL   Eosinophils Relative 4 %   Eosinophils Absolute 0.2 0.0 - 0.7 K/uL   Basophils Relative 1 %   Basophils Absolute 0.0 0.0 - 0.1 K/uL   Mr Brain Wo Contrast (neuro Protocol)  01/08/2015  CLINICAL DATA:  Numbness right foot and arm.  Right pronator drift. EXAM: MRI HEAD WITHOUT CONTRAST TECHNIQUE: Multiplanar, multiecho pulse sequences of the brain and surrounding structures were obtained without intravenous contrast. COMPARISON:  None. FINDINGS: Acute infarct left lateral thalamus in the region of the lateral geniculate  body. This measures approximately 10 x 15 mm. No other area of acute infarct is identified. Periventricular and deep white matter hyperintensities bilaterally most consistent with chronic ischemia. Chronic lacunar infarct in the left anterior thalamus. Chronic infarct in the right pons with diffuse microvascular ischemic changes in the pons and brachium pontis bilaterally. Negative for  hemorrhage or mass Ventricle size normal.  Pituitary normal in size Mild mucosal edema paranasal sinuses.  No mass in the orbit. IMPRESSION: Acute infarct in the left lateral geniculate body. Moderate chronic ischemic changes in the cerebral white matter, basal ganglia, and pons. Electronically Signed   By: Franchot Gallo M.D.   On: 01/08/2015 13:33    Assessment: 64 y.o. female with complains of right foot numbness x 2 days and MRI brain revealing a small acute infarct left lateral geniculate body, likely due to small vessel disease. She ran out her medications couple days ago but said that hasn't been taking her aspirin for a while. Neuro-exam is unremarkable. Patient did not receive iv tPA due to late presentation. Admit to medicine .Complete stroke work up as detailed below.  Stroke Risk Factors - age, HTN, HLD, DM type 2, smoking, prior stroke  Plan: 1. HgbA1c, fasting lipid panel 2. MRI, MRA  of the brain without contrast 3. Echocardiogram 4. Carotid dopplers 5. Prophylactic therapy-aspirin 6. Risk factor modification 7. Telemetry monitoring 8. Frequent neuro checks 9. PT/OT SLP  Dorian Pod, MD [facility] [phone]  01/08/2015, 5:04 PM

## 2015-01-08 NOTE — H&P (Signed)
[facility] History and Physical  [name] [id number] DOB: Jul 01, 1950 DOA: 01/08/2015  Referring physician: little PCP: [name], FNP   Chief Complaint: right foot numbness  HPI: [name] is a very pleasant 64 y.o. female past medical history that includes uncontrolled hypertension, hyperlipidemia, diabetes, previous CVA presents to the emergency department with a 24-hour history of right foot numbness. Evaluation in the emergency department includes an MRI revealing acute infarct in the left lateral geniculate body, mild hyponatremia, anemia.  She reports that yesterday she developed worsening numbness of her right foot. She recites that she put on her shoe" I couldn't feel it". She states the numbness and tingling spread to her right knee. She reports that she does have intermittent diabetic neuropathy but that this was different.  Associated symptoms include a feeling of heaviness in that right leg and unsteady gait. Today she says her right foot and leg are better and now she feels like her left knee is developing numbness. She denies headache visual disturbances numbness or tingling of her upper extremities. She denies any difficulty chewing or swallowing or speaking. She denies any chest pain palpitations abdominal pain nausea vomiting. She denies any dysuria hematuria frequency or urgency. She does report that she has "run out of my blood pressure medicine. She just recently got medications renewed.  In the emergency department she is afebrile hemodynamically stable and not hypoxic. Some of my exam she reports numbness right foot which improved.  She passed her bedside swallow eval and was requesting food at the time of my exam  Review of Systems:  10 point review of systems complete and all systems are negative except as indicated in the history of present illness  Past Medical History  Diagnosis Date  . Hypertension   . History of sebaceous cyst     . Tobacco abuse   . Cough   . Hyperthyroidism   . Hyperlipemia   . Diabetes mellitus, type 2 ([place])   . Chronic back pain   . Lumbar radiculopathy   . Shortness of breath 09/13/2013  . GERD (gastroesophageal reflux disease)   . Stroke [facility])    Past Surgical History  Procedure Laterality Date  . Retinopathy surgery  2013    Dr. Zigmund Daniel  . Abdominal hysterectomy     Social History:  reports that she has been smoking Cigarettes.  She has a 17.5 pack-year smoking history. She has never used smokeless tobacco. She reports that she drinks alcohol. She reports that she does not use illicit drugs. Reports she stopped smoking in 2013. She lives at home alone No Known Allergies  Family History  Problem Relation Age of Onset  . Hypertension Mother   . Sleep apnea Mother   . Cancer Father     lung cancer - smoker  . Hypertension Sister   . Hypertension Brother   . Hypertension Sister   . Diabetes Brother     Prior to Admission medications   Medication Sig Start Date End Date Taking? Authorizing Provider  amLODipine-benazepril (LOTREL) 10-40 MG per capsule Take 1 capsule by mouth daily.   Yes Historical Provider, MD  aspirin 325 MG tablet Take 325 mg by mouth daily.   Yes Historical Provider, MD  atorvastatin (LIPITOR) 40 MG tablet Take 2 tablets (80 mg total) by mouth daily. 10/09/14  Yes Golden Circle, FNP  cloNIDine (CATAPRES) 0.2 MG tablet Take 1 tablet (0.2 mg total) by mouth 3 (three) times daily. 12/31/12  Yes Legrand Como  E Norins, MD  furosemide (LASIX) 40 MG tablet Take 40 mg by mouth daily.   Yes Historical Provider, MD  metFORMIN (GLUCOPHAGE-XR) 750 MG 24 hr tablet Take 1 tablet (750 mg total) by mouth 2 (two) times daily. 07/09/14  Yes Golden Circle, FNP  metoprolol succinate (TOPROL-XL) 100 MG 24 hr tablet Take 100 mg by mouth daily. Take with or immediately following a meal.   Yes Historical Provider, MD  minoxidil (LONITEN) 2.5 MG tablet Take 2.5 mg by mouth daily.   Yes  Historical Provider, MD   Physical Exam: Filed Vitals:   01/08/15 1430 01/08/15 1500 01/08/15 1545 01/08/15 1630  BP: 116/64 113/70 130/64 144/67  Pulse: 68 65 64 78  Temp:      TempSrc:      Resp:   17 17  Height:      Weight:      SpO2: 100% 100% 99% 93%    Wt Readings from Last 3 Encounters:  01/08/15 67.586 kg (149 lb)  10/07/14 65.681 kg (144 lb 12.8 oz)  07/04/14 68.947 kg (152 lb)    General:  Appears calm and comfortable Eyes: PERRL, normal lids, irises & conjunctiva ENT: grossly normal hearing, lips & tongue his membranes of her mouth are moist and pink Neck: no LAD, masses or thyromegaly Cardiovascular: RRR, +murmur. No LE edema.  Respiratory: CTA bilaterally, no w/r/r. Normal respiratory effort. Abdomen: soft, ntnd positive bowel sounds throughout no guarding Skin: no rash or induration seen on limited exam Musculoskeletal: grossly normal tone BUE/BLE Psychiatric: grossly normal mood and affect, speech fluent and appropriate Neurologic: grossly non-focal. speech clear facial symmetry slight right pronator drift lateral upper extremity strength 5 out of 5 bilateral lower extremity strength 5 out of 5           Labs on Admission:  Basic Metabolic Panel:  Recent Labs Lab 01/08/15 1233  NA 132*  K 4.4  CL 101  CO2 22  GLUCOSE 259*  BUN 48*  CREATININE 2.24*  CALCIUM 8.6*   Liver Function Tests: No results for input(s): AST, ALT, ALKPHOS, BILITOT, PROT, ALBUMIN in the last 168 hours. No results for input(s): LIPASE, AMYLASE in the last 168 hours. No results for input(s): AMMONIA in the last 168 hours. CBC:  Recent Labs Lab 01/08/15 1233  WBC 4.5  NEUTROABS 2.4  HGB 9.7*  HCT 28.9*  MCV 88.9  PLT 207   Cardiac Enzymes: No results for input(s): CKTOTAL, CKMB, CKMBINDEX, TROPONINI in the last 168 hours.  BNP (last 3 results) No results for input(s): BNP in the last 8760 hours.  ProBNP (last 3 results) No results for input(s): PROBNP in the  last 8760 hours.  CBG: No results for input(s): GLUCAP in the last 168 hours.  Radiological Exams on Admission: Mr Brain Wo Contrast (neuro Protocol)  01/08/2015  CLINICAL DATA:  Numbness right foot and arm.  Right pronator drift. EXAM: MRI HEAD WITHOUT CONTRAST TECHNIQUE: Multiplanar, multiecho pulse sequences of the brain and surrounding structures were obtained without intravenous contrast. COMPARISON:  None. FINDINGS: Acute infarct left lateral thalamus in the region of the lateral geniculate body. This measures approximately 10 x 15 mm. No other area of acute infarct is identified. Periventricular and deep white matter hyperintensities bilaterally most consistent with chronic ischemia. Chronic lacunar infarct in the left anterior thalamus. Chronic infarct in the right pons with diffuse microvascular ischemic changes in the pons and brachium pontis bilaterally. Negative for hemorrhage or mass Ventricle size normal.  Pituitary normal in size Mild mucosal edema paranasal sinuses.  No mass in the orbit. IMPRESSION: Acute infarct in the left lateral geniculate body. Moderate chronic ischemic changes in the cerebral white matter, basal ganglia, and pons. Electronically Signed   By: Franchot Gallo M.D.   On: 01/08/2015 13:33    EKG: Independently pending  Assessment/Plan Principal Problem:   Acute ischemic stroke [facility]) Active Problems:   Secondary diabetes with peripheral vascular disease ([place])   Essential hypertension   Hyperlipemia   Anemia   Acute kidney injury ([place])  1. Acute ischemic stroke. Per MRI. Risk factors include uncontrolled hypertension, hyperlipidemia, diabetes, CVA about a year ago. -Admit to telemetry -CVA workup per protocol -MRA, carotid Dopplers, 2-D echo, EKG -Obtain lipid panel hemoglobin A1c -Aspirin and statin -PT/OT/ST evaluation -She passed the bedside swallow eval so we will give her a car modified diet -Neurology to consult -Of note she reported to me today  that she stop second 2013. PCP office visit note dated May of this CR indicates she was still smoking and on Chantix  #2. Hypertension. Poorly controlled. -She reports not having finances to be able to refill her medications -Just recently had antihypertensive medications refilled -Chart review indicates home medications include Lotrel, Catapres, Lasix, -We'll hold these for now to allow for permissive hypertension -Monitor her blood pressure closely for rebound -Of note echo done last year reveals a pattern of severe LVH EF 55%  #3. Diabetes. PVD, Patient not always compliant -Home occasion includes metformin. Will hold this for now -We'll obtain a hemoglobin A1c -Use sliding scale insulin for optimal control  4. Anemia. Likely related to chronic disease -Current hemoglobin down from last year -No obvious signs/symptoms of bleeding -We will obtain an anemia panel -FOBT  #5. Hyperlipidemia -Continue her statin -Obtain lipid panel  6. Acute kidney injury. I suspect she has a chronic component given her medical history but chart review indicates normal creatinine in August of last year. -Current creatinine 2.24 -Difficult to know what baseline creatinine is -Will provide gentle IV fluids -Hold nephrotoxins -Monitor her urine output -If no improvement consider renal ultrasound   Neuro   Code Status: full DVT Prophylaxis: Family Communication: son at bedside Disposition Plan: home hopefully 24 hours  Time spent: 47 minutes  [facility]

## 2015-01-08 NOTE — ED Notes (Signed)
MD at bedside. 

## 2015-01-08 NOTE — ED Notes (Signed)
Patient here with 2 days of lower leg numbness and reports that they feel heavy. Just started taking her daily meds again yesterday after being out a week. Alert and oriented on assessment

## 2015-01-08 NOTE — ED Notes (Signed)
Patient transported to MRI 

## 2015-01-08 NOTE — ED Notes (Signed)
Neurology at bedside.

## 2015-01-08 NOTE — ED Notes (Signed)
Pt transported to MRI 

## 2015-01-08 NOTE — ED Provider Notes (Signed)
CSN: [id number]     Arrival date & time 01/08/15  1016 History   First MD Initiated Contact with Patient 01/08/15 1020     Chief Complaint  Patient presents with  . Weakness     (Consider location/radiation/quality/duration/timing/severity/associated sxs/prior Treatment) HPI Comments: 64 year old female with past medical history including hypertension, hyperlipidemia, tobacco use, type 2 diabetes mellitus, CVA who presents with foot numbness. Patient states that she was off of her medications for a week because she could not afford them. 2 days ago she restarted all of her medications except for her atorvastatin. She reports that yesterday she began noticing some numbness and heaviness which started in her right foot. Today, her right foot is slightly better but she states her left foot also feels slightly numb. She endorses very mild numbness in her right arm. She denies any headache, neck pain, visual changes, fever, vomiting, pain, or recent illness. Sr. notes that she seemed unsteady on her feet yesterday. No problems with confusion or speech difficulty.  Patient is a 64 y.o. female presenting with weakness. The history is provided by the patient.  Weakness    Past Medical History  Diagnosis Date  . Hypertension   . History of sebaceous cyst   . Tobacco abuse   . Cough   . Hyperthyroidism   . Hyperlipemia   . Diabetes mellitus, type 2 ([place])   . Chronic back pain   . Lumbar radiculopathy   . Shortness of breath 09/13/2013  . GERD (gastroesophageal reflux disease)   . Stroke [facility])    Past Surgical History  Procedure Laterality Date  . Retinopathy surgery  2013    Dr. Zigmund Daniel  . Abdominal hysterectomy     Family History  Problem Relation Age of Onset  . Hypertension Mother   . Sleep apnea Mother   . Cancer Father     lung cancer - smoker  . Hypertension Sister   . Hypertension Brother   . Hypertension Sister   . Diabetes Brother    Social History  Substance Use  Topics  . Smoking status: Current Every Day Smoker -- 0.50 packs/day for 35 years    Types: Cigarettes  . Smokeless tobacco: Never Used     Comment: patient advised to keep diary, develop strategy and stop  . Alcohol Use: Yes     Comment: rare   OB History    No data available     Review of Systems  Neurological: Positive for weakness.   10 Systems reviewed and are negative for acute change except as noted in the HPI.    Allergies  Review of patient's allergies indicates no known allergies.  Home Medications   Prior to Admission medications   Medication Sig Start Date End Date Taking? Authorizing Provider  amLODipine-benazepril (LOTREL) 10-40 MG per capsule Take 1 capsule by mouth daily.   Yes Historical Provider, MD  aspirin 325 MG tablet Take 325 mg by mouth daily.   Yes Historical Provider, MD  atorvastatin (LIPITOR) 40 MG tablet Take 2 tablets (80 mg total) by mouth daily. 10/09/14  Yes Golden Circle, FNP  cloNIDine (CATAPRES) 0.2 MG tablet Take 1 tablet (0.2 mg total) by mouth 3 (three) times daily. 12/31/12  Yes Neena Rhymes, MD  furosemide (LASIX) 40 MG tablet Take 40 mg by mouth daily.   Yes Historical Provider, MD  metFORMIN (GLUCOPHAGE-XR) 750 MG 24 hr tablet Take 1 tablet (750 mg total) by mouth 2 (two) times daily. 07/09/14  Yes  Golden Circle, FNP  metoprolol succinate (TOPROL-XL) 100 MG 24 hr tablet Take 100 mg by mouth daily. Take with or immediately following a meal.   Yes Historical Provider, MD  minoxidil (LONITEN) 2.5 MG tablet Take 2.5 mg by mouth daily.   Yes Historical Provider, MD   BP 113/70 mmHg  Pulse 65  Temp(Src) 98.8 F (37.1 C) (Oral)  Resp 16  Ht 5\' 4"  (1.626 m)  Wt 149 lb (67.586 kg)  BMI 25.56 kg/m2  SpO2 100% Physical Exam  Constitutional: She is oriented to person, place, and time. She appears well-developed and well-nourished. No distress.  Awake, alert  HENT:  Head: Normocephalic and atraumatic.  Eyes: Conjunctivae and EOM  are normal. Pupils are equal, round, and reactive to light.  Neck: Neck supple.  Cardiovascular: Normal rate, regular rhythm and normal heart sounds.   No murmur heard. Pulmonary/Chest: Effort normal and breath sounds normal. No respiratory distress.  Abdominal: Soft. Bowel sounds are normal. She exhibits no distension.  Musculoskeletal: She exhibits no edema.  Neurological: She is alert and oriented to person, place, and time. She has normal reflexes. No cranial nerve deficit. She exhibits normal muscle tone.  Fluent speech, normal finger-to-nose testing, + R pronator drift; mildly decreased sensation R arm and leg  Skin: Skin is warm and dry.  Psychiatric: She has a normal mood and affect. Judgment and thought content normal.  Nursing note and vitals reviewed.   ED Course  Procedures (including critical care time) Labs Review Labs Reviewed  BASIC METABOLIC PANEL - Abnormal; Notable for the following:    Sodium 132 (*)    Glucose, Bld 259 (*)    BUN 48 (*)    Creatinine, Ser 2.24 (*)    Calcium 8.6 (*)    GFR calc non Af Amer 22 (*)    GFR calc Af Amer 25 (*)    All other components within normal limits  CBC WITH DIFFERENTIAL/PLATELET - Abnormal; Notable for the following:    RBC 3.25 (*)    Hemoglobin 9.7 (*)    HCT 28.9 (*)    All other components within normal limits    Imaging Review Mr Brain Wo Contrast (neuro Protocol)  01/08/2015  CLINICAL DATA:  Numbness right foot and arm.  Right pronator drift. EXAM: MRI HEAD WITHOUT CONTRAST TECHNIQUE: Multiplanar, multiecho pulse sequences of the brain and surrounding structures were obtained without intravenous contrast. COMPARISON:  None. FINDINGS: Acute infarct left lateral thalamus in the region of the lateral geniculate body. This measures approximately 10 x 15 mm. No other area of acute infarct is identified. Periventricular and deep white matter hyperintensities bilaterally most consistent with chronic ischemia. Chronic  lacunar infarct in the left anterior thalamus. Chronic infarct in the right pons with diffuse microvascular ischemic changes in the pons and brachium pontis bilaterally. Negative for hemorrhage or mass Ventricle size normal.  Pituitary normal in size Mild mucosal edema paranasal sinuses.  No mass in the orbit. IMPRESSION: Acute infarct in the left lateral geniculate body. Moderate chronic ischemic changes in the cerebral white matter, basal ganglia, and pons. Electronically Signed   By: Franchot Gallo M.D.   On: 01/08/2015 13:33   I have personally reviewed and evaluated these lab results as part of my medical decision-making.   EKG Interpretation None      MDM   Final diagnoses:  Acute ischemic stroke (HCC)  Numbness of foot   Patient presents with 2 days of right foot numbness without any  other major complaints. She was recently off of her medications but restarted them 2 days ago. On exam the patient was well-appearing and in no acute distress. Vital signs stable, mildly hypertensive. On exam, she had a right pronator drift and mildly decreased sensation on right arm and right leg. Obtained basic labs and because of her multiple risk factors for stroke, obtained an MRI of the brain.  Labs showed mild AKI w/ Cr 2.24, glucose 259. Mild anemia. MRI showed acute infarct in L lateral geniculate body. Informed pt and family of results and need for admission. I contacted neurology and spoke with Dr. Aram Beecham, who will see the patient in consultation. Discussed with [facility] hospitalist and pt admitted to Dr. Tyrell Antonio for further care.  Sharlett Iles, MD 01/08/15 [phone]

## 2015-01-08 NOTE — ED Notes (Signed)
Patient states ran out of bp meds x 2 days ago.  Patient states having L and R foot numbness that started yesterday morning at 1000.

## 2015-01-08 NOTE — Progress Notes (Signed)
Pt arrived to 5M20 via stretcher.  Pt ambulated from stretcher to bed without difficulty.  Diet order placed.  Telemetry applied and CCMD notified.  No c/o pain or discomfort at this time.  MD notified of patient's arrival to the unit.  Will continue to monitor. Cori Razor, RN

## 2015-01-08 NOTE — ED Notes (Signed)
Patient returned from MRI.

## 2015-01-09 ENCOUNTER — Observation Stay (HOSPITAL_BASED_OUTPATIENT_CLINIC_OR_DEPARTMENT_OTHER): Payer: No Typology Code available for payment source

## 2015-01-09 DIAGNOSIS — I6789 Other cerebrovascular disease: Secondary | ICD-10-CM

## 2015-01-09 DIAGNOSIS — Z794 Long term (current) use of insulin: Secondary | ICD-10-CM | POA: Diagnosis not present

## 2015-01-09 DIAGNOSIS — E1165 Type 2 diabetes mellitus with hyperglycemia: Secondary | ICD-10-CM | POA: Diagnosis present

## 2015-01-09 DIAGNOSIS — I639 Cerebral infarction, unspecified: Secondary | ICD-10-CM

## 2015-01-09 DIAGNOSIS — Z79899 Other long term (current) drug therapy: Secondary | ICD-10-CM | POA: Diagnosis not present

## 2015-01-09 DIAGNOSIS — R110 Nausea: Secondary | ICD-10-CM | POA: Diagnosis not present

## 2015-01-09 DIAGNOSIS — E871 Hypo-osmolality and hyponatremia: Secondary | ICD-10-CM | POA: Diagnosis present

## 2015-01-09 DIAGNOSIS — D649 Anemia, unspecified: Secondary | ICD-10-CM | POA: Diagnosis present

## 2015-01-09 DIAGNOSIS — D638 Anemia in other chronic diseases classified elsewhere: Secondary | ICD-10-CM | POA: Diagnosis present

## 2015-01-09 DIAGNOSIS — F17210 Nicotine dependence, cigarettes, uncomplicated: Secondary | ICD-10-CM | POA: Diagnosis present

## 2015-01-09 DIAGNOSIS — E1140 Type 2 diabetes mellitus with diabetic neuropathy, unspecified: Secondary | ICD-10-CM | POA: Diagnosis present

## 2015-01-09 DIAGNOSIS — I10 Essential (primary) hypertension: Secondary | ICD-10-CM | POA: Diagnosis not present

## 2015-01-09 DIAGNOSIS — I129 Hypertensive chronic kidney disease with stage 1 through stage 4 chronic kidney disease, or unspecified chronic kidney disease: Secondary | ICD-10-CM | POA: Diagnosis present

## 2015-01-09 DIAGNOSIS — E785 Hyperlipidemia, unspecified: Secondary | ICD-10-CM | POA: Diagnosis not present

## 2015-01-09 DIAGNOSIS — E11319 Type 2 diabetes mellitus with unspecified diabetic retinopathy without macular edema: Secondary | ICD-10-CM | POA: Diagnosis present

## 2015-01-09 DIAGNOSIS — N183 Chronic kidney disease, stage 3 (moderate): Secondary | ICD-10-CM | POA: Diagnosis present

## 2015-01-09 DIAGNOSIS — E1151 Type 2 diabetes mellitus with diabetic peripheral angiopathy without gangrene: Secondary | ICD-10-CM | POA: Diagnosis present

## 2015-01-09 DIAGNOSIS — Z7984 Long term (current) use of oral hypoglycemic drugs: Secondary | ICD-10-CM | POA: Diagnosis not present

## 2015-01-09 DIAGNOSIS — N179 Acute kidney failure, unspecified: Secondary | ICD-10-CM | POA: Diagnosis not present

## 2015-01-09 DIAGNOSIS — E1122 Type 2 diabetes mellitus with diabetic chronic kidney disease: Secondary | ICD-10-CM | POA: Diagnosis present

## 2015-01-09 DIAGNOSIS — Z8673 Personal history of transient ischemic attack (TIA), and cerebral infarction without residual deficits: Secondary | ICD-10-CM | POA: Diagnosis not present

## 2015-01-09 DIAGNOSIS — Z7982 Long term (current) use of aspirin: Secondary | ICD-10-CM | POA: Diagnosis not present

## 2015-01-09 LAB — BASIC METABOLIC PANEL
ANION GAP: 7 (ref 5–15)
BUN: 39 mg/dL — ABNORMAL HIGH (ref 6–20)
CALCIUM: 8 mg/dL — AB (ref 8.9–10.3)
CO2: 21 mmol/L — ABNORMAL LOW (ref 22–32)
Chloride: 106 mmol/L (ref 101–111)
Creatinine, Ser: 1.75 mg/dL — ABNORMAL HIGH (ref 0.44–1.00)
GFR, EST AFRICAN AMERICAN: 34 mL/min — AB (ref >60)
GFR, EST NON AFRICAN AMERICAN: 30 mL/min — AB (ref >60)
GLUCOSE: 200 mg/dL — AB (ref 65–99)
POTASSIUM: 4.2 mmol/L (ref 3.5–5.1)
SODIUM: 134 mmol/L — AB (ref 135–145)

## 2015-01-09 LAB — GLUCOSE, CAPILLARY
GLUCOSE-CAPILLARY: 159 mg/dL — AB (ref 65–99)
GLUCOSE-CAPILLARY: 146 mg/dL — AB (ref 65–99)
GLUCOSE-CAPILLARY: 202 mg/dL — AB (ref 65–99)
Glucose-Capillary: 197 mg/dL — ABNORMAL HIGH (ref 65–99)

## 2015-01-09 LAB — CBC
HCT: 28 % — ABNORMAL LOW (ref 36.0–46.0)
Hemoglobin: 9.3 g/dL — ABNORMAL LOW (ref 12.0–15.0)
MCH: 29.8 pg (ref 26.0–34.0)
MCHC: 33.2 g/dL (ref 30.0–36.0)
MCV: 89.7 fL (ref 78.0–100.0)
PLATELETS: 206 10*3/uL (ref 150–400)
RBC: 3.12 MIL/uL — AB (ref 3.87–5.11)
RDW: 12.1 % (ref 11.5–15.5)
WBC: 4.2 10*3/uL (ref 4.0–10.5)

## 2015-01-09 LAB — LIPID PANEL
CHOL/HDL RATIO: 5.5 ratio
Cholesterol: 143 mg/dL (ref 0–200)
HDL: 26 mg/dL — ABNORMAL LOW (ref >40)
LDL CALC: 93 mg/dL (ref 0–99)
Triglycerides: 122 mg/dL (ref <150)
VLDL: 24 mg/dL (ref 0–40)

## 2015-01-09 LAB — HEMOGLOBIN A1C
HEMOGLOBIN A1C: 10.9 % — AB (ref 4.8–5.6)
MEAN PLASMA GLUCOSE: 266 mg/dL

## 2015-01-09 MED ORDER — INSULIN GLARGINE 100 UNIT/ML ~~LOC~~ SOLN
5.0000 [IU] | Freq: Every day | SUBCUTANEOUS | Status: DC
  Administered 2015-01-09: 5 [IU] via SUBCUTANEOUS
  Filled 2015-01-09 (×2): qty 0.05

## 2015-01-09 MED ORDER — HYDRALAZINE HCL 20 MG/ML IJ SOLN
10.0000 mg | Freq: Four times a day (QID) | INTRAMUSCULAR | Status: DC | PRN
  Administered 2015-01-09 – 2015-01-10 (×2): 10 mg via INTRAVENOUS
  Filled 2015-01-09 (×2): qty 1

## 2015-01-09 MED ORDER — LIVING WELL WITH DIABETES BOOK
Freq: Once | Status: AC
  Administered 2015-01-09: 14:00:00
  Filled 2015-01-09: qty 1

## 2015-01-09 NOTE — Progress Notes (Signed)
Nutrition Brief Note  Patient identified on the Malnutrition Screening Tool (MST) Report  Wt Readings from Last 15 Encounters:  01/08/15 149 lb (67.586 kg)  10/07/14 144 lb 12.8 oz (65.681 kg)  07/04/14 152 lb (68.947 kg)  06/24/14 150 lb 6.4 oz (68.221 kg)  09/15/13 148 lb 13 oz (67.5 kg)  06/11/13 148 lb 6.4 oz (67.314 kg)  12/24/12 155 lb (70.308 kg)  12/13/12 152 lb (68.947 kg)  12/13/11 141 lb 1.3 oz (63.993 kg)  10/22/10 149 lb (67.586 kg)  05/28/10 156 lb (70.761 kg)  03/30/10 154 lb (69.854 kg)  09/10/09 152 lb (68.947 kg)  05/19/09 156 lb (70.761 kg)  05/01/09 157 lb 2.1 oz (71.274 kg)    Body mass index is 25.56 kg/(m^2). Patient meets criteria for Normal weight based on current BMI. Pt states that she doesn't have much of an appetite but, she snacks often and she maintains her weight around 150 lbs  Current diet order is Carb Modified, patient is consuming approximately 50-100% of meals at this time. Labs and medications reviewed. Pt requesting review of diabetic diet.   RD provided "Carbohydrate Counting for People with Diabetes" handout from the Academy of [employer] and Dietetics. Discussed different food groups and their effects on blood sugar, emphasizing carbohydrate-containing foods. Provided list of carbohydrates and recommended serving sizes of common foods.  Discussed importance of controlled and consistent carbohydrate intake throughout the day. Provided examples of ways to balance meals/snacks and encouraged intake of high-fiber, whole grain complex carbohydrates. Teach back method used.  Expect good compliance. Pt states that she really just needs to cut back on her intake of sweets and stop drinking regular soda and juice. She also aims to walk more.   No additional nutrition interventions warranted at this time. RD name and contact information provided. If nutrition issues arise, please rconsult RD.   Scarlette Ar RD, LDN Inpatient Clinical  Dietitian Pager: [phone] After Hours Pager: [phone]

## 2015-01-09 NOTE — Progress Notes (Signed)
Results for [name], [name] (MRN [id number]) as of 01/09/2015 12:09  Ref. Range 01/08/2015 17:54  Hemoglobin A1C Latest Ref Range: 4.8-5.6 % 10.9 (H)   Spoke with patient regarding elevated A1C.  She states that she takes Metformin 750 mg daily at home.  She see's Dr. Jenny Reichmann and his Nurse Practitioner.  Discussed elevated A1C with patient and goal A1C with patient.  Gave her handout regarding A1C levels and goals.  She asked if she needs insulin?  Patient may do okay with adding oral agent and increasing Metformin?  She states that she is in the process of transitioning to Medicare in January 2017.  Please consider increasing Metformin to 750 mg bid and add Amaryl 4 mg daily.  Patient also needs Rx. For Glucose meter/strips.   She is interested in attending diabetes classes and admits that she has a hard time knowing what to eat.  She admits that she drinks regular sodas and Orange juice.  Briefly discussed limiting sugar in drinks and increasing water intake.  Also discussed the association of diabetes and stroke and the importance of controlling blood sugars to prevent complications.    Will follow.  Thanks, Adah Perl, RN, BC-ADM Inpatient Diabetes Coordinator Pager [phone] (8a-5p)

## 2015-01-09 NOTE — Progress Notes (Signed)
Inpatient Diabetes Program Recommendations  AACE/ADA: New Consensus Statement on Inpatient Glycemic Control (2015)  Target Ranges:  Prepandial:   less than 140 mg/dL      Peak postprandial:   less than 180 mg/dL (1-2 hours)      Critically ill patients:  140 - 180 mg/dL   Review of Glycemic Control:  Results for KEEANA, BEARFIELD (MRN [id number]) as of 01/09/2015 10:34  Ref. Range 01/08/2015 21:14 01/09/2015 06:39  Glucose-Capillary Latest Ref Range: 65-99 mg/dL 238 (H) 197 (H)  Results for FATMAH, GALAS (MRN [id number]) as of 01/09/2015 10:34  Ref. Range 01/08/2015 17:54  Hemoglobin A1C Latest Ref Range: 4.8-5.6 % 10.9 (H)   Diabetes history: Type 2 diabetes Outpatient Diabetes medications: Metformin 750 mg bid Current orders for Inpatient glycemic control:  Novolog moderate tid with meals and HS  Inpatient Diabetes Program Recommendations:   Please consider adding Lantus 12 units q HS.  Thanks, Adah Perl, RN, BC-ADM Inpatient Diabetes Coordinator Pager [phone] (8a-5p)

## 2015-01-09 NOTE — Progress Notes (Signed)
Systolic BP high. NP notified with order of hydralazine PRN. Medication administer.

## 2015-01-09 NOTE — Progress Notes (Signed)
STROKE TEAM PROGRESS NOTE   HISTORY [name] is a 64 y.o. 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 She stated that she ran out of her medications 3-4 days ago, and before yesterday she woke up " staggering and with poor body control" and subsequently noticed a numb/tingly sensation in her right foot. Denies associated weakness of the right leg, no numbness-tingling-weakness right arm. Further, no HA, vertigo, double vision, slurred speech, language or vision impairment. [name] said that the right foot numbness is going away but now is " more noticeable in the left foot". MRI brain was personally reviewed and showed a small acute infarct involving the left lateral geniculate body.   Date last known well: 01/06/15 Time last known well: unable to deteermine tPA Given: no, late presentation   SUBJECTIVE (INTERVAL HISTORY) No family members present. The patient states that she is feeling it better today. Dr. Leonie Man reviewed her history of presentation in detail and discuss imaging findings and reinforced the importance of her aspirin therapy. He also encouraged her to quit smoking.   OBJECTIVE Temp:  [98.4 F (36.9 C)-100.9 F (38.3 C)] 98.4 F (36.9 C) (12/16 0600) Pulse Rate:  [59-78] 76 (12/16 0600) Cardiac Rhythm:  [-] Normal sinus rhythm (12/15 1900) Resp:  [16-20] 18 (12/16 0600) BP: (113-169)/(41-76) 163/56 mmHg (12/16 0600) SpO2:  [93 %-100 %] 94 % (12/16 0600) Weight:  [67.586 kg (149 lb)] 67.586 kg (149 lb) (12/15 1032)  CBC:  Recent Labs Lab 01/08/15 1233 01/09/15 0527  WBC 4.5 4.2  NEUTROABS 2.4  --   HGB 9.7* 9.3*  HCT 28.9* 28.0*  MCV 88.9 89.7  PLT 207 [id number]    Basic Metabolic Panel:  Recent Labs Lab 01/08/15 1233 01/09/15 0527  NA 132* 134*  K 4.4 4.2   CL 101 106  CO2 22 21*  GLUCOSE 259* 200*  BUN 48* 39*  CREATININE 2.24* 1.75*  CALCIUM 8.6* 8.0*    Lipid Panel:    Component Value Date/Time   CHOL 143 01/09/2015 0527   TRIG 122 01/09/2015 0527   HDL 26* 01/09/2015 0527   CHOLHDL 5.5 01/09/2015 0527   VLDL 24 01/09/2015 0527   LDLCALC 93 01/09/2015 0527   HgbA1c:  Lab Results  Component Value Date   HGBA1C 10.9* 01/08/2015   Urine Drug Screen: No results found for: LABOPIA, COCAINSCRNUR, LABBENZ, AMPHETMU, THCU, LABBARB    IMAGING  Dg Chest 2 View 01/08/2015   No active cardiopulmonary disease.    Mr Brain Wo Contrast (neuro Protocol) 01/08/2015   Acute infarct in the left lateral geniculate body. Moderate chronic ischemic changes in the cerebral white matter, basal ganglia, and pons.    Mr Jodene Nam Head/brain Wo Cm 01/08/2015   No acute large vessel occlusion or definite high-grade stenosis on this moderately motion degraded examination.     PHYSICAL EXAM  pleasant frail middle-aged African-American lady not in distress. . Afebrile. Head is nontraumatic. Neck is supple without bruit.    Cardiac exam no murmur or gallop. Lungs are clear to auscultation. Distal pulses are well felt.  Neurological Exam :  Awake alert oriented 3 with normal speech and language function. No aphasia, dysarthria. Extraocular movements are full range without nystagmus. Pupils equal reactive. Fundi were not visualized. Face is symmetric without weakness. Tongue is midline. Motor system  exam revealed no upper or lower extremity drift. Symmetric and equal strength in all 4 extremities except diminished fine finger movements on the right and orbits left over right upper extremity.. Deep tendon reflexes are 2+ symmetric. Plantars are downgoing. Touch pinprick sensation are preserved bilaterally. Gait slightly unsteady when she gets up.     ASSESSMENT/PLAN [name] is a 64 y.o. 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 She did not receive IV t-PA due to late presentation.  Stroke:  Dominant infarct secondary to small vessel disease.   Resultant  no deficits  MRI  Acute infarct in the left lateral geniculate body.  MRA  unremarkable  Carotid Doppler Bilaterally carotid arteries demonstrate no evidence of a significant stenosis 1-39%.  2D Echo EF 65 - 70%  - no cardiac source of emboli identified  LDL - 93  HgbA1c pending  VTE prophylaxis - Lovenox  Diet Carb Modified Fluid consistency:: Thin; Room service appropriate?: Yes  aspirin 325 mg daily prior to admission but had run out, now on aspirin 325 mg daily  Patient counseled to be compliant with her antithrombotic medications  Ongoing aggressive stroke risk factor management  Therapy recommendations: Home health physical therapy. Occupational therapy evaluation pending.  Disposition:  Pending  Hypertension  Blood pressure somewhat high  Permissive hypertension (OK if < 220/120) but gradually normalize in 5-7 days  Hyperlipidemia  Home meds:  Lipitor 40 mg daily increased to 80 mg daily.  LDL 93  goal < 70  Lipitor increased to 80 mg daily  Continue statin at discharge  Diabetes  HgbA1c pending, goal < 7.0  Uncontrolled  Other Stroke Risk Factors  Advanced age  Cigarette smoker, advised to stop smoking  ETOH use  Hx stroke/TIA   Other Active Problems  Anemia   renal insufficiency  PLAN  Discharge today or tomorrow  Hospital day #   Mikey Bussing PA-C [facility] Pager [phone] 01/09/2015, 2:19 PM I have personally examined this patient, reviewed notes, independently viewed imaging studies, participated in medical she presented with two-day history of gait and balance difficulties as well as right foot numbness secondary to left subcortical  infarct etiology likely small vessel disease. She remains at risk for neurological worsening, recurrent stroke, TIA and needs ongoing stroke evaluation and aggressive risk factor modification.  Antony Contras, MD Medical Director [facility] Pager: [phone] 01/09/2015 3:51 PM     To contact Stroke Continuity provider, please refer to [email or web address] After hours, contact General Neurology

## 2015-01-09 NOTE — Progress Notes (Signed)
°  VASCULAR LAB PRELIMINARY  PRELIMINARY  PRELIMINARY  PRELIMINARY   Carotid duplex  completed.    Preliminary report:  Bilateral:  1-39% ICA stenosis.  Vertebral artery flow is antegrade.      Tami L Wood, RVT, RDMS 01/09/2015, 9:42 AM

## 2015-01-09 NOTE — Evaluation (Signed)
Speech Language Pathology Evaluation Patient Details Name: [name] MRN: [id number] DOB: 02/22/1950 Today's Date: 01/09/2015 Time: 1135-1150 SLP Time Calculation (min) (ACUTE ONLY): 15 min  Problem List:  Patient Active Problem List   Diagnosis Date Noted  . Anemia 01/08/2015  . Acute ischemic stroke ([place]) 01/08/2015  . Acute kidney injury ([place]) 01/08/2015  . Stroke ([place]) 01/08/2015  . Hypertension   . Hyperlipemia   . Loss of weight 10/07/2014  . Facial swelling 07/04/2014  . Chest pain 09/13/2013  . Dyspnea 09/13/2013  . Malignant hypertension 09/13/2013  . Carotid bruit present 12/13/2012  . Routine health maintenance 10/24/2010  . PERIPHERAL EDEMA 09/10/2009  . COUGH 04/03/2009  . ELECTROCARDIOGRAM, ABNORMAL 04/11/2008  . HEART MURMUR, SYSTOLIC [id number]  . SEBACEOUS CYST, NECK 12/13/2006  . HYPERTHYROIDISM 09/27/2006  . Secondary diabetes with peripheral vascular disease ([place]) 09/27/2006  . HYPERLIPIDEMIA 09/27/2006  . TOBACCO ABUSE 09/27/2006  . Essential hypertension 09/27/2006   Past Medical History:  Past Medical History  Diagnosis Date  . Hypertension   . History of sebaceous cyst   . Tobacco abuse   . Cough   . Hyperthyroidism   . Hyperlipemia   . Diabetes mellitus, type 2 ([place])   . Chronic back pain   . Lumbar radiculopathy   . Shortness of breath 09/13/2013  . GERD (gastroesophageal reflux disease)   . Stroke [facility])    Past Surgical History:  Past Surgical History  Procedure Laterality Date  . Retinopathy surgery  2013    Dr. Zigmund Daniel  . Abdominal hysterectomy     HPI:  64 y.o. 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  MRI revealed a small acute infarct left lateral geniculate body   Assessment / Plan / Recommendation Clinical Impression  Pt presents with normal  speech, language, and cognition with no acute changes s/p CVA.  No SLP f/u is needed.  WIll sign off.     SLP Assessment  Patient does not need any further Speech Lanaguage Pathology Services    Follow Up Recommendations  None    Frequency and Duration           SLP Evaluation Prior Functioning  Cognitive/Linguistic Baseline: Within functional limits Type of Home: House  Lives With: Alone Vocation: Retired   Associate Professor  Overall Cognitive Status: Within Functional Limits for tasks assessed Arousal/Alertness: Awake/alert Orientation Level: Oriented X4 Attention: Selective Selective Attention: Appears intact Awareness: Appears intact Problem Solving: Appears intact    Comprehension  Auditory Comprehension Overall Auditory Comprehension: Appears within functional limits for tasks assessed Visual Recognition/Discrimination Discrimination: Within Function Limits Reading Comprehension Reading Status: Within funtional limits    Expression Expression Primary Mode of Expression: Verbal Verbal Expression Overall Verbal Expression: Appears within functional limits for tasks assessed Written Expression Dominant Hand: Right Written Expression: Not tested   Oral / Motor Oral Motor/Sensory Function Overall Oral Motor/Sensory Function: Within functional limits Motor Speech Overall Motor Speech: Appears within functional limits for tasks assessed    Juan Quam Laurice 01/09/2015, 12:04 PM

## 2015-01-09 NOTE — Progress Notes (Signed)
Occupational Therapy Evaluation Patient Details Name: [name] MRN: [id number] DOB: 07/09/1950 Today's Date: 01/09/2015    History of Present Illness Pt adm with imbalance and rt foot numbness. MRI showed small acute infarct left lateral geniculate body. 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 Feel pt is close to baseline. Will follow up with pt in am to complete further ADL and mobility tasks to facilitate safe D/C home with intermittent S.     Follow Up Recommendations  No OT follow up;Supervision - Intermittent    Equipment Recommendations  Tub/shower seat    Recommendations for Other Services       Precautions / Restrictions Precautions Precautions: Fall      Mobility Bed Mobility Overal bed mobility: Modified Independent                Transfers Overall transfer level: Modified independent                    Balance     Sitting balance-Leahy Scale: Normal       Standing balance-Leahy Scale: Fair                              ADL Overall ADL's : Needs assistance/impaired                                     Functional mobility during ADLs: Supervision/safety (ambulation @ room) General ADL Comments: overall S. Did not observe any LOB. gait drifts at times to R. Recommeded pt have her sister with her when she showers and recommended she use a showerseat. Recommended her sister be present when she cooked, etc to make sure she is at baseline with these tasks. Family verbalized understanding. Brother present during eval /     Vision Vision Assessment?: No apparent visual deficits   Perception     Praxis Praxis Praxis tested?: Within functional limits    Pertinent Vitals/Pain Pain Assessment: No/denies pain     Hand Dominance Right   Extremity/Trunk Assessment Upper Extremity  Assessment Upper Extremity Assessment: RUE deficits/detail RUE Deficits / Details: minimal RUE weakness proximally. Good B grip strength   Lower Extremity Assessment Lower Extremity Assessment: Defer to PT evaluation   Cervical / Trunk Assessment Cervical / Trunk Assessment: Normal   Communication Communication Communication: No difficulties   Cognition Arousal/Alertness: Awake/alert Behavior During Therapy: WFL for tasks assessed/performed Overall Cognitive Status: Within Functional Limits for tasks assessed                     General Comments       Exercises       Shoulder Instructions      Home Living Family/patient expects to be discharged to:: Private residence Living Arrangements: Alone Available Help at Discharge: Family;Available PRN/intermittently Type of Home: House Home Access: Stairs to enter [employer] of Steps: 1-2 Entrance Stairs-Rails: Right Home Layout: One level     Bathroom Shower/Tub: Tub/shower unit Shower/tub characteristics: Architectural technologist: Standard Bathroom Accessibility: Yes How Accessible: Accessible via walker Home Equipment: None          Prior Functioning/Environment Level of Independence: Independent  OT Diagnosis: Generalized weakness   OT Problem List: Decreased safety awareness   OT Treatment/Interventions: Self-care/ADL training;Therapeutic activities;Patient/family education;Balance training    OT Goals(Current goals can be found in the care plan section) Acute Rehab OT Goals Patient Stated Goal: return home OT Goal Formulation: With patient Time For Goal Achievement: 01/16/15 Potential to Achieve Goals: Good ADL Goals Pt Will Perform Lower Body Bathing: with modified independence;sit to/from stand Pt Will Transfer to Toilet: with modified independence;ambulating Pt Will Perform Toileting - Clothing Manipulation and hygiene: with modified independence;sit to/from stand Pt  Will Perform Tub/Shower Transfer: with supervision;Tub transfer;ambulating  OT Frequency: Min 2X/week   Barriers to D/C:            Co-evaluation              End of Session Nurse Communication: Mobility status  Activity Tolerance: Patient tolerated treatment well Patient left: in bed;with call bell/phone within reach;with family/visitor present   Time: [id number] OT Time Calculation (min): 20 min Charges:  OT General Charges $OT Visit: 1 Procedure OT Evaluation $Initial OT Evaluation Tier I: 1 Procedure G-Codes:    Moo Gravley,HILLARY January 31, 2015, 5:15 PM   [facility], OTR/L  [phone] 01/31/15

## 2015-01-09 NOTE — Progress Notes (Signed)
  Echocardiogram 2D Echocardiogram has been performed.  [name] 01/09/2015, 8:51 AM

## 2015-01-09 NOTE — Evaluation (Signed)
Physical Therapy Evaluation Patient Details Name: [name] MRN: [id number] DOB: 07/31/1950 Today's Date: 01/09/2015   History of Present Illness  Pt adm with imbalance and rt foot numbness. MRI showed small acute infarct left lateral geniculate body. 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 Pt needs skilled PT to maximize independence and safety to allow discharge to home with HHPT.      Follow Up Recommendations Home health PT    Equipment Recommendations  None recommended by PT    Recommendations for Other Services       Precautions / Restrictions Precautions Precautions: Fall      Mobility  Bed Mobility               General bed mobility comments: Pt up in chair  Transfers Overall transfer level: Modified independent                  Ambulation/Gait Ambulation/Gait assistance: Min guard Ambulation Distance (Feet): 350 Feet Assistive device: None Gait Pattern/deviations: Step-through pattern;Decreased stride length;Staggering right;Drifts right/left   Gait velocity interpretation: Below normal speed for age/gender General Gait Details: Slightly unsteady gait. Able to recover without assist.  Stairs            Wheelchair Mobility    Modified Rankin (Stroke Patients Only)       Balance Overall balance assessment: Needs assistance Sitting-balance support: No upper extremity supported;Feet supported Sitting balance-Leahy Scale: Normal     Standing balance support: No upper extremity supported;During functional activity Standing balance-Leahy Scale: Fair                               Pertinent Vitals/Pain Pain Assessment: No/denies pain    Home Living Family/patient expects to be discharged to:: Private residence Living  Arrangements: Alone Available Help at Discharge: Family;Available PRN/intermittently Type of Home: House Home Access: Stairs to enter Entrance Stairs-Rails: Right Entrance Stairs-Number of Steps: 1-2 Home Layout: One level Home Equipment: None      Prior Function Level of Independence: Independent               Hand Dominance   Dominant Hand: Right    Extremity/Trunk Assessment   Upper Extremity Assessment: Defer to OT evaluation           Lower Extremity Assessment: Overall WFL for tasks assessed         Communication   Communication: No difficulties  Cognition Arousal/Alertness: Awake/alert Behavior During Therapy: WFL for tasks assessed/performed Overall Cognitive Status: Within Functional Limits for tasks assessed                      General Comments      Exercises        Assessment/Plan    PT Assessment Patient needs continued PT services  PT Diagnosis Difficulty walking   PT Problem List Decreased balance;Decreased mobility  PT Treatment Interventions Gait training;Functional mobility training;Balance training;Therapeutic activities;Patient/family education   PT Goals (Current goals can be found in the Care Plan section) Acute Rehab PT Goals Patient Stated Goal: return home PT Goal Formulation: With patient Time For Goal Achievement: 01/14/15 Potential to Achieve Goals: Good    Frequency Min 3X/week   Barriers to discharge  Co-evaluation               End of Session   Activity Tolerance: Patient tolerated treatment well Patient left: in chair;with call bell/phone within reach;with chair alarm set Nurse Communication: Mobility status         Time: 1209-1220 PT Time Calculation (min) (ACUTE ONLY): 11 min   Charges:   PT Evaluation $Initial PT Evaluation Tier I: 1 Procedure     PT G Codes:        [place] Mullett January 20, 2015, 2:00 PM [facility] PT [phone]

## 2015-01-09 NOTE — Progress Notes (Signed)
PROGRESS NOTE    [name] [id number] DOB: 01/21/1951 DOA: 01/08/2015 PCP: [name], FNP  HPI/Brief narrative 64 year old female patient with history of HTN, HLD, DM with peripheral neuropathy, prior CVA, ongoing tobacco abuse, presented to [facility] ED on 01/08/15 for complaints of right from numbness and gait problems/imbalance and found to have acute infarct involving the left lateral geniculate body. She ran out of her medications 3-4 days ago. Admitted for further evaluation. Neurology consulted.   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  Resultant no deficits MRI Acute infarct in the left lateral geniculate body. MRA unremarkable Carotid Doppler Bilaterally carotid arteries demonstrate no evidence of a significant stenosis 1-39%. 2D Echo EF 65 - 70% - no cardiac source of emboli identified LDL - 93 HgbA1c pending VTE prophylaxis - Lovenox Diet Carb Modified Fluid consistency:: Thin; Room service appropriate?: Yes aspirin 325 mg daily prior to admission but had run out 2 months PTA, now on aspirin 325 mg daily Patient counseled to be compliant with her antithrombotic medications Ongoing aggressive stroke risk factor management Therapy recommendations: Home health physical therapy. Occupational therapy evaluation pending.  Essential hypertension - Stable. Allow permissive hypertension: Antihypertensives on hold for same.  Hyperlipidemia - LDL 93, goal <70. Lipitor increased from 40 MG daily to 80 MG daily.  Type II DM with renal complications, uncontrolled - Continue SSI. Follow A1c. Will add Lantus 10 units at bedtime. May not be a candidate for metformin at discharge due to renal insufficiency.  Tobacco abuse - Cessation counseled  Anemia - Stable  Acute on stage III chronic kidney disease - Admitted with creatinine of 2.24. Creatinine was 1.4 approximately 6 months ago. Briefly hydrated with IV fluids.  Diuretics held. Improving. Follow BMP   DVT prophylaxis: Lovenox Code Status: Full Family Communication: None at bedside Disposition Plan: DC home possibly 12/17   Consultants:  Neurology  Procedures:  None  Antibiotics:  None   Subjective: Denies complaints. Leg numbness has resolved.  Objective: Filed Vitals:   01/09/15 0000 01/09/15 0200 01/09/15 0400 01/09/15 0600  BP: 126/64 139/57 150/58 163/56  Pulse: 75 77 76 76  Temp: 100 F (37.8 C) 99.9 F (37.7 C) 98.4 F (36.9 C) 98.4 F (36.9 C)  TempSrc: Oral Oral Oral Oral  Resp: 18 16 20 18   Height:      Weight:      SpO2: 95% 94% 95% 94%   No intake or output data in the 24 hours ending 01/09/15 0741 Filed Weights   01/08/15 1032  Weight: 67.586 kg (149 lb)     Exam:  General exam: Pleasant middle-aged female sitting up comfortably in chair this morning. Respiratory system: Clear. No increased work of breathing. Cardiovascular system: S1 & S2 heard, RRR. No JVD, murmurs, gallops, clicks or pedal edema. Telemetry: Sinus rhythm. Gastrointestinal system: Abdomen is nondistended, soft and nontender. Normal bowel sounds heard. Central nervous system: Alert and oriented. No focal neurological deficits. Extremities: Symmetric 5 x 5 power.   Data Reviewed: Basic Metabolic Panel:  Recent Labs Lab 01/08/15 1233 01/09/15 0527  NA 132* 134*  K 4.4 4.2  CL 101 106  CO2 22 21*  GLUCOSE 259* 200*  BUN 48* 39*  CREATININE 2.24* 1.75*  CALCIUM 8.6* 8.0*   Liver Function Tests: No results for input(s): AST, ALT, ALKPHOS, BILITOT, PROT, ALBUMIN in the last 168 hours. No results for input(s): LIPASE, AMYLASE in the last 168 hours. No results for input(s): AMMONIA in the  last 168 hours. CBC:  Recent Labs Lab 01/08/15 1233 01/09/15 0527  WBC 4.5 4.2  NEUTROABS 2.4  --   HGB 9.7* 9.3*  HCT 28.9* 28.0*  MCV 88.9 89.7  PLT 207 206   Cardiac Enzymes: No results for input(s): CKTOTAL, CKMB, CKMBINDEX,  TROPONINI in the last 168 hours. BNP (last 3 results) No results for input(s): PROBNP in the last 8760 hours. CBG:  Recent Labs Lab 01/08/15 2114 01/09/15 0639  GLUCAP 238* 197*    No results found for this or any previous visit (from the past 240 hour(s)).         Studies: Dg Chest 2 View  01/08/2015  CLINICAL DATA:  Stroke. EXAM: CHEST  2 VIEW COMPARISON:  September 14, 2013. FINDINGS: Stable cardiomediastinal silhouette. No pneumothorax or pleural effusion is noted. Multilevel degenerative disc disease is noted in the lower thoracic spine. No acute pulmonary disease is noted. IMPRESSION: No active cardiopulmonary disease. Electronically Signed   By: Marijo Conception, M.D.   On: 01/08/2015 19:30   Mr Brain Wo Contrast (neuro Protocol)  01/08/2015  CLINICAL DATA:  Numbness right foot and arm.  Right pronator drift. EXAM: MRI HEAD WITHOUT CONTRAST TECHNIQUE: Multiplanar, multiecho pulse sequences of the brain and surrounding structures were obtained without intravenous contrast. COMPARISON:  None. FINDINGS: Acute infarct left lateral thalamus in the region of the lateral geniculate body. This measures approximately 10 x 15 mm. No other area of acute infarct is identified. Periventricular and deep white matter hyperintensities bilaterally most consistent with chronic ischemia. Chronic lacunar infarct in the left anterior thalamus. Chronic infarct in the right pons with diffuse microvascular ischemic changes in the pons and brachium pontis bilaterally. Negative for hemorrhage or mass Ventricle size normal.  Pituitary normal in size Mild mucosal edema paranasal sinuses.  No mass in the orbit. IMPRESSION: Acute infarct in the left lateral geniculate body. Moderate chronic ischemic changes in the cerebral white matter, basal ganglia, and pons. Electronically Signed   By: Franchot Gallo M.D.   On: 01/08/2015 13:33   Mr Jodene Nam Head/brain Wo Cm  01/08/2015  CLINICAL DATA:  Numbness RIGHT foot and  arm. RIGHT pronator drift. Follow-up. EXAM: MRA HEAD WITHOUT CONTRAST TECHNIQUE: Angiographic images of the Circle of Willis were obtained using MRA technique without intravenous contrast. COMPARISON:  MRI head January 08, 2015 at 1300 hours. FINDINGS: Moderately motion degraded examination resulting in duplicated appearance of the intracranial vessels. Anterior circulation: Normal flow related enhancement of the included cervical, petrous, cavernous and supraclinoid internal carotid arteries. Patent anterior communicating artery. Normal flow related enhancement of the anterior and middle cerebral arteries, including distal segments. No large vessel occlusion, definite high-grade stenosis, aneurysm. Posterior circulation: Codominant vertebral arteries. Basilar artery is patent, with normal flow related enhancement of the main branch vessels. Normal flow related enhancement of the posterior cerebral arteries. No large vessel occlusion, definite high-grade stenosis, aneurysm. IMPRESSION: No acute large vessel occlusion or definite high-grade stenosis on this moderately motion degraded examination. Electronically Signed   By: Elon Alas M.D.   On: 01/08/2015 19:37        Scheduled Meds: .  stroke: mapping our early stages of recovery book   Does not apply Once  . aspirin  325 mg Oral Daily  . atorvastatin  80 mg Oral Daily  . docusate sodium  100 mg Oral BID  . enoxaparin (LOVENOX) injection  30 mg Subcutaneous Q24H  . insulin aspart  0-15 Units Subcutaneous TID WC  .  insulin aspart  0-5 Units Subcutaneous QHS  . senna  1 tablet Oral BID   Continuous Infusions: . sodium chloride 100 mL/hr at 01/09/15 [id number]    Principal Problem:   Acute ischemic stroke [facility]) Active Problems:   Secondary diabetes with peripheral vascular disease ([place])   Essential hypertension   Hyperlipemia   Anemia   Acute kidney injury ([place])   Stroke ([place])    Time spent: 40 minutes.    Vernell Leep, MD,  FACP, FHM. [facility] Pager [phone]  If 7PM-7AM, please contact night-coverage [email or web address] Password [facility] 01/09/2015, 7:41 AM

## 2015-01-10 DIAGNOSIS — E785 Hyperlipidemia, unspecified: Secondary | ICD-10-CM

## 2015-01-10 DIAGNOSIS — N179 Acute kidney failure, unspecified: Secondary | ICD-10-CM

## 2015-01-10 DIAGNOSIS — I10 Essential (primary) hypertension: Secondary | ICD-10-CM

## 2015-01-10 DIAGNOSIS — I639 Cerebral infarction, unspecified: Principal | ICD-10-CM

## 2015-01-10 LAB — CBC
HCT: 29 % — ABNORMAL LOW (ref 36.0–46.0)
HEMOGLOBIN: 9.9 g/dL — AB (ref 12.0–15.0)
MCH: 30.7 pg (ref 26.0–34.0)
MCHC: 34.1 g/dL (ref 30.0–36.0)
MCV: 90.1 fL (ref 78.0–100.0)
Platelets: 214 10*3/uL (ref 150–400)
RBC: 3.22 MIL/uL — AB (ref 3.87–5.11)
RDW: 12.3 % (ref 11.5–15.5)
WBC: 5.3 10*3/uL (ref 4.0–10.5)

## 2015-01-10 LAB — HEMOGLOBIN A1C
Hgb A1c MFr Bld: 11.2 % — ABNORMAL HIGH (ref 4.8–5.6)
MEAN PLASMA GLUCOSE: 275 mg/dL

## 2015-01-10 LAB — BASIC METABOLIC PANEL
Anion gap: 7 (ref 5–15)
BUN: 24 mg/dL — AB (ref 6–20)
CHLORIDE: 110 mmol/L (ref 101–111)
CO2: 23 mmol/L (ref 22–32)
Calcium: 8.6 mg/dL — ABNORMAL LOW (ref 8.9–10.3)
Creatinine, Ser: 1.41 mg/dL — ABNORMAL HIGH (ref 0.44–1.00)
GFR calc Af Amer: 45 mL/min — ABNORMAL LOW (ref >60)
GFR calc non Af Amer: 38 mL/min — ABNORMAL LOW (ref >60)
GLUCOSE: 174 mg/dL — AB (ref 65–99)
POTASSIUM: 3.9 mmol/L (ref 3.5–5.1)
Sodium: 140 mmol/L (ref 135–145)

## 2015-01-10 LAB — GLUCOSE, CAPILLARY
Glucose-Capillary: 138 mg/dL — ABNORMAL HIGH (ref 65–99)
Glucose-Capillary: 161 mg/dL — ABNORMAL HIGH (ref 65–99)

## 2015-01-10 MED ORDER — MINOXIDIL 2.5 MG PO TABS
2.5000 mg | ORAL_TABLET | Freq: Every day | ORAL | Status: DC
  Administered 2015-01-10: 2.5 mg via ORAL
  Filled 2015-01-10: qty 1

## 2015-01-10 MED ORDER — CLONIDINE HCL 0.1 MG PO TABS
0.2000 mg | ORAL_TABLET | Freq: Three times a day (TID) | ORAL | Status: DC
  Administered 2015-01-10 (×2): 0.2 mg via ORAL
  Filled 2015-01-10 (×2): qty 2

## 2015-01-10 MED ORDER — CLONIDINE HCL 0.1 MG PO TABS: 0.2000 mg | ORAL_TABLET | Freq: Three times a day (TID) | ORAL | Status: DC

## 2015-01-10 MED ORDER — ATORVASTATIN CALCIUM 40 MG PO TABS: 80.0000 mg | ORAL_TABLET | Freq: Every day | ORAL | Status: DC

## 2015-01-10 MED ORDER — AMLODIPINE BESY-BENAZEPRIL HCL 10-40 MG PO CAPS: 1.0000 | ORAL_CAPSULE | Freq: Every day | ORAL | Status: DC

## 2015-01-10 MED ORDER — AMLODIPINE BESYLATE 10 MG PO TABS
10.0000 mg | ORAL_TABLET | Freq: Every day | ORAL | Status: DC
  Administered 2015-01-10: 10 mg via ORAL
  Filled 2015-01-10: qty 1

## 2015-01-10 MED ORDER — BENAZEPRIL HCL 20 MG PO TABS
40.0000 mg | ORAL_TABLET | Freq: Every day | ORAL | Status: DC
  Administered 2015-01-10: 40 mg via ORAL
  Filled 2015-01-10: qty 2

## 2015-01-10 MED ORDER — METOPROLOL SUCCINATE ER 100 MG PO TB24
100.0000 mg | ORAL_TABLET | Freq: Every day | ORAL | Status: DC
  Administered 2015-01-10: 100 mg via ORAL
  Filled 2015-01-10: qty 1

## 2015-01-10 MED ORDER — ONDANSETRON HCL 4 MG/2ML IJ SOLN
4.0000 mg | Freq: Four times a day (QID) | INTRAMUSCULAR | Status: DC | PRN
  Administered 2015-01-10: 4 mg via INTRAVENOUS
  Filled 2015-01-10: qty 2

## 2015-01-10 NOTE — Discharge Instructions (Signed)
Stroke Prevention Some medical conditions and behaviors are associated with an increased chance of having a stroke. You may prevent a stroke by making healthy choices and managing medical conditions. HOW CAN I REDUCE MY RISK OF HAVING A STROKE?   Stay physically active. Get at least 30 minutes of activity on most or all days.  Do not smoke. It may also be helpful to avoid exposure to secondhand smoke.  Limit alcohol use. Moderate alcohol use is considered to be:  No more than 2 drinks per day for men.  No more than 1 drink per day for nonpregnant women.  Eat healthy foods. This involves:  Eating 5 or more servings of fruits and vegetables a day.  Making dietary changes that address high blood pressure (hypertension), high cholesterol, diabetes, or obesity.  Manage your cholesterol levels.  Making food choices that are high in fiber and low in saturated fat, trans fat, and cholesterol may control cholesterol levels.  Take any prescribed medicines to control cholesterol as directed by your health care provider.  Manage your diabetes.  Controlling your carbohydrate and sugar intake is recommended to manage diabetes.  Take any prescribed medicines to control diabetes as directed by your health care provider.  Control your hypertension.  Making food choices that are low in salt (sodium), saturated fat, trans fat, and cholesterol is recommended to manage hypertension.  Ask your health care provider if you need treatment to lower your blood pressure. Take any prescribed medicines to control hypertension as directed by your health care provider.  If you are 18-39 years of age, have your blood pressure checked every 3-5 years. If you are 40 years of age or older, have your blood pressure checked every year.  Maintain a healthy weight.  Reducing calorie intake and making food choices that are low in sodium, saturated fat, trans fat, and cholesterol are recommended to manage  weight.  Stop drug abuse.  Avoid taking birth control pills.  Talk to your health care provider about the risks of taking birth control pills if you are over 35 years old, smoke, get migraines, or have ever had a blood clot.  Get evaluated for sleep disorders (sleep apnea).  Talk to your health care provider about getting a sleep evaluation if you snore a lot or have excessive sleepiness.  Take medicines only as directed by your health care provider.  For some people, aspirin or blood thinners (anticoagulants) are helpful in reducing the risk of forming abnormal blood clots that can lead to stroke. If you have the irregular heart rhythm of atrial fibrillation, you should be on a blood thinner unless there is a good reason you cannot take them.  Understand all your medicine instructions.  Make sure that other conditions (such as anemia or atherosclerosis) are addressed. SEEK IMMEDIATE MEDICAL CARE IF:   You have sudden weakness or numbness of the face, arm, or leg, especially on one side of the body.  Your face or eyelid droops to one side.  You have sudden confusion.  You have trouble speaking (aphasia) or understanding.  You have sudden trouble seeing in one or both eyes.  You have sudden trouble walking.  You have dizziness.  You have a loss of balance or coordination.  You have a sudden, severe headache with no known cause.  You have new chest pain or an irregular heartbeat. Any of these symptoms may represent a serious problem that is an emergency. Do not wait to see if the symptoms will   go away. Get medical help at once. Call your local emergency services (911 in [place] Do not drive yourself to the hospital.   This information is not intended to replace advice given to you by your health care provider. Make sure you discuss any questions you have with your health care provider.   Document Released: 02/18/2004 Document Revised: 01/31/2014 Document Reviewed:  07/13/2012 Elsevier Interactive Patient Education 2016 Elsevier Inc.  

## 2015-01-10 NOTE — Care Management Note (Addendum)
Case Management Note  Patient Details  Name: [name] MRN: [id number] Date of Birth: [birth year]  Subjective/Objective:                   Acute ischemic stroke [facility])   Action/Plan: CM spoke to patient who said that she is being discharged home alone. Patient said that she lives alone and her son will be able to intermittently check on her. Patient said that she was previously having trouble with affording her medications but now has them filled and declined any further needs for medications. She said that she will start with Medicare on 01/28/15. CM offered patient choice for [facility] PT and DME (cane and tub bench) and patient chose [facility]. CM called tiffany with [facility] and referral accepted. CM called Merry Proud with [facility] DME who will deliver DME to bedside.   Patient said that her PCP is at [facility]. CM reinforced the importance of medication compliance and patient verbalized understanding.  No further CM needs identified at this time  Expected Discharge Date:  01/10/15               Expected Discharge Plan:  [place]  In-House Referral:     Discharge planning Services  CM Consult  Post Acute Care Choice:  Durable Medical Equipment, Home Health Choice offered to:  Patient  DME Arranged:  Kasandra Knudsen, Tub bench DME Agency:  [place]:  PT [facility] Agency:  [place]  Status of Service:  Completed, signed off  Medicare Important Message Given:    Date Medicare IM Given:    Medicare IM give by:    Date Additional Medicare IM Given:    Additional Medicare Important Message give by:     If discussed at [place] of Stay Meetings, dates discussed:    Additional Comments:  Guido Sander, RN 01/10/2015, 4:37 PM

## 2015-01-10 NOTE — Progress Notes (Signed)
Physical Therapy Treatment Patient Details Name: [name] MRN: [id number] DOB: November 16, 1950 Today's Date: 2015-01-11    History of Present Illness Pt adm with imbalance and rt foot numbness. MRI showed small acute infarct left lateral geniculate body. 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  Follow Up Recommendations  Home health PT     Equipment Recommendations  None recommended by PT    Recommendations for Other Services       Precautions / Restrictions Precautions Precautions: Fall    Mobility  Bed Mobility Overal bed mobility: Modified Independent                Transfers Overall transfer level: Modified independent                  Ambulation/Gait Ambulation/Gait assistance: Supervision;Modified independent (Device/Increase time) Ambulation Distance (Feet): 400 Feet Assistive device: None Gait Pattern/deviations: Step-through pattern;Drifts right/left   Gait velocity interpretation: Below normal speed for age/gender General Gait Details: Gait with drifting rt or lt but no loss of balance. Used wall rail at times. In room modified independent. Supervision in hallway.   Stairs            Wheelchair Mobility    Modified Rankin (Stroke Patients Only)       Balance     Sitting balance-Leahy Scale: Normal       Standing balance-Leahy Scale: Fair                      Cognition Arousal/Alertness: Awake/alert Behavior During Therapy: WFL for tasks assessed/performed Overall Cognitive Status: Within Functional Limits for tasks assessed                      Exercises      General Comments        Pertinent Vitals/Pain Pain Assessment: No/denies pain    Home Living                      Prior Function            PT Goals (current goals can now be found in the care plan section)  Acute Rehab PT Goals Patient Stated Goal: return home    Frequency  Min 3X/week    PT Plan Current plan remains appropriate    Co-evaluation             End of Session   Activity Tolerance: Patient tolerated treatment well Patient left: in chair;with call bell/phone within reach;with chair alarm set     Time: 1051-1103 PT Time Calculation (min) (ACUTE ONLY): 12 min  Charges:  $Gait Training: 8-22 mins                    G Codes:      Tell Rozelle January 11, 2015, 1:25 PM [employer] PT [phone]

## 2015-01-10 NOTE — Progress Notes (Signed)
Patient ready for discharge to home; discharge instructions given and reviewed; Rx sent electronically and resolved by MD; accompanied home with her sister; patient agreed to be contacted by [facility]; stroke education provided.

## 2015-01-10 NOTE — Progress Notes (Signed)
STROKE TEAM PROGRESS NOTE   HISTORY [name] is a 64 y.o. 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 She stated that she ran out of her medications 3-4 days ago, and before yesterday she woke up " staggering and with poor body control" and subsequently noticed a numb/tingly sensation in her right foot. Denies associated weakness of the right leg, no numbness-tingling-weakness right arm. Further, no HA, vertigo, double vision, slurred speech, language or vision impairment. [name] said that the right foot numbness is going away but now is " more noticeable in the left foot". MRI brain was personally reviewed and showed a small acute infarct involving the left lateral geniculate body.   Date last known well: 01/06/15 Time last known well: unable to deteermine tPA Given: no, late presentation   SUBJECTIVE (INTERVAL HISTORY) The patient's sister is at the bedside. The patient states that she feels much improved. She believes she is ready for discharge.   OBJECTIVE Temp:  [98.2 F (36.8 C)-100 F (37.8 C)] 99.1 F (37.3 C) (12/17 0927) Pulse Rate:  [66-94] 94 (12/17 0927) Cardiac Rhythm:  [-] Sinus tachycardia (12/17 0702) Resp:  [16-18] 18 (12/17 0927) BP: (160-224)/(65-100) 189/78 mmHg (12/17 0927) SpO2:  [94 %-100 %] 96 % (12/17 0927)  CBC:   Recent Labs Lab 01/08/15 1233 01/09/15 0527 01/10/15 0225  WBC 4.5 4.2 5.3  NEUTROABS 2.4  --   --   HGB 9.7* 9.3* 9.9*  HCT 28.9* 28.0* 29.0*  MCV 88.9 89.7 90.1  PLT 207 206 [id number]    Basic Metabolic Panel:   Recent Labs Lab 01/09/15 0527 01/10/15 0225  NA 134* 140  K 4.2 3.9  CL 106 110  CO2 21* 23  GLUCOSE 200* 174*  BUN 39* 24*  CREATININE 1.75* 1.41*  CALCIUM 8.0* 8.6*    Lipid Panel:     Component Value Date/Time    CHOL 143 01/09/2015 0527   TRIG 122 01/09/2015 0527   HDL 26* 01/09/2015 0527   CHOLHDL 5.5 01/09/2015 0527   VLDL 24 01/09/2015 0527   LDLCALC 93 01/09/2015 0527   HgbA1c:  Lab Results  Component Value Date   HGBA1C 11.2* 01/08/2015   Urine Drug Screen: No results found for: LABOPIA, COCAINSCRNUR, LABBENZ, AMPHETMU, THCU, LABBARB    IMAGING  Dg Chest 2 View 01/08/2015   No active cardiopulmonary disease.    Mr Brain Wo Contrast (neuro Protocol) 01/08/2015   Acute infarct in the left lateral geniculate body. Moderate chronic ischemic changes in the cerebral white matter, basal ganglia, and pons.    Mr Jodene Nam Head/brain Wo Cm 01/08/2015   No acute large vessel occlusion or definite high-grade stenosis on this moderately motion degraded examination.     PHYSICAL EXAM  pleasant frail middle-aged African-American lady not in distress. . Afebrile. Head is nontraumatic. Neck is supple without bruit.    Cardiac exam no murmur or gallop. Lungs are clear to auscultation. Distal pulses are well felt.  Neurological Exam :  Awake alert oriented 3 with normal speech and language function. No aphasia, dysarthria. Extraocular movements are full range without nystagmus. Pupils equal reactive. Fundi were not visualized. Face is symmetric without weakness. Tongue is midline. Motor system exam revealed no upper or lower extremity drift. Symmetric and equal strength in all 4 extremities except diminished fine finger  movements on the right and orbits left over right upper extremity.. Deep tendon reflexes are 2+ symmetric. Plantars are downgoing. Touch pinprick sensation are preserved bilaterally. Gait slightly unsteady when she gets up.     ASSESSMENT/PLAN Ms. [name] is a 64 y.o. 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 She did not receive  IV t-PA due to late presentation.  Stroke:  Dominant infarct secondary to small vessel disease.   Resultant  no deficits  MRI  Acute infarct in the left lateral geniculate body.  MRA  unremarkable  Carotid Doppler Bilaterally carotid arteries demonstrate no evidence of a significant stenosis 1-39%.  2D Echo EF 65 - 70%  - no cardiac source of emboli identified  LDL - 93  HgbA1c 11.2  VTE prophylaxis - Lovenox Diet Carb Modified Fluid consistency:: Thin; Room service appropriate?: Yes  aspirin 325 mg daily prior to admission but had run out, now on aspirin 325 mg daily  Patient counseled to be compliant with her antithrombotic medications  Ongoing aggressive stroke risk factor management  Therapy recommendations: Home health physical therapy.   Disposition:  Probable discharge today  Hypertension  Blood pressure somewhat high  Permissive hypertension (OK if < 220/120) but gradually normalize in 5-7 days  Hyperlipidemia  Home meds:  Lipitor 40 mg daily increased to 80 mg daily.  LDL 93  goal < 70  Lipitor increased to 80 mg daily  Continue statin at discharge  Diabetes  HgbA1c pending, goal < 7.0  Uncontrolled  Other Stroke Risk Factors  Advanced age  Cigarette smoker, advised to stop smoking  ETOH use  Hx stroke/TIA   Other Active Problems  Anemia   renal insufficiency  PLAN  The stroke team will sign off at this time. Please call if we can be of further service.  Follow-up in the office with Dr. Leonie Man in 2 months.  Hospital day # 1  Mikey Bussing [facility] Pager [phone] 01/10/2015, 1:02 PM     To contact Stroke Continuity provider, please refer to [email or web address] After hours, contact General Neurology

## 2015-01-10 NOTE — Discharge Summary (Signed)
Physician Discharge Summary  [name] [id number] DOB: Aug 21, 1950 DOA: 01/08/2015  PCP: [name], FNP  Admit date: 01/08/2015 Discharge date: 01/10/2015  Time spent: Greater than 30 minutes  Recommendations for Outpatient Follow-up:  1. [name], FNP/PCP in 3 days with repeat labs (CBC & BMP) 2. Dr. Antony Contras, Neurology in 2 months. 3. Home Health PT, Tub/shower seat.  Discharge Diagnoses:  Principal Problem:   Acute ischemic stroke [facility]) Active Problems:   Secondary diabetes with peripheral vascular disease ([place])   Essential hypertension   Hyperlipemia   Anemia   Acute kidney injury ([place])   Stroke [facility])   Discharge Condition: Improved & Stable  Diet recommendation: Heart Healthy & Diabetic diet.  Filed Weights   01/08/15 1032  Weight: 67.586 kg (149 lb)    History of present illness:  64 year old female patient with history of HTN, HLD, DM with peripheral neuropathy, prior CVA, ongoing tobacco abuse, presented to [facility] ED on 01/08/15 for complaints of right foot numbness and gait problems/imbalance and found to have acute infarct involving the left lateral geniculate body. She ran out of her medications for a couple of weeks and then refilled & restarted them in the last couple of days pta. Admitted for further evaluation. Neurology consulted.  Hospital Course:   Stroke: Dominant infarct secondary to small vessel disease.  Resultant no deficits MRI Acute infarct in the left lateral geniculate body. MRA unremarkable Carotid Doppler Bilaterally carotid arteries demonstrate no evidence of a significant stenosis 1-39%. 2D Echo EF 65 - 70% - no cardiac source of emboli identified LDL - 93 HgbA1c  : 11.2 aspirin 325 mg daily prior to admission but had run out 2 months PTA, now on aspirin 325 mg daily Patient counseled to be compliant with her antithrombotic medications Ongoing aggressive stroke risk factor management Therapy recommendations:  Home health physical therapy. Occupational therapy > No OT but DME recommended as above.  Essential hypertension -  Probably has difficult to control hypertension given polypharmacy that she is on at home. Her antihypertensives were held in the hospital to allow for permissive hypertension due to recent acute stroke. Resumed all antihypertensives except Lasix due to BP 224/100 early this morning. Blood pressures better. Close outpatient monitoring and management.  Hyperlipidemia - LDL 93, goal <70.  Continue Lipitor.  Type II DM with renal complications, uncontrolled -  A1c: 11.2.  Treated in the hospital with low-dose Lantus  & SSI. Creatinine has improved to 1.4 which is probably her baseline. Resumed home dose of metformin at DC but needs to follow-up with PCP in the next couple of days to decide further course of action and will need medication adjustment for better diabetic control -? DC metformin , start alternative oral hypoglycemic i.e. Glucotrol or insulins - ? Compliance.  Tobacco abuse - Cessation counseled  Anemia - Stable  Acute on stage III chronic kidney disease - Admitted with creatinine of 2.24. Creatinine was 1.4 approximately 6 months ago. Briefly hydrated with IV fluids. Diuretics held. Creatinine probably at baseline now. Patient clinically euvolemic. Will continue to hold Lasix until close outpatient follow-up with PCP with repeat BMP at which time decision can be made to resume Lasix.   Consultants:  Neurology  Procedures:   2-D echo 01/09/15: Study Conclusions  - Left ventricle: The cavity size was normal. Wall thickness was increased in a pattern of moderate LVH. Systolic function was vigorous. The estimated ejection fraction was in the range of 65% to 70%. Doppler parameters are  consistent with abnormal left ventricular relaxation (grade 1 diastolic dysfunction).   Carotid Dopplers 01/09/15: Summary:  1. Bilaterally carotid arteries demonstrate  no evidence of a  significant stenosis 1-39%  Antibiotics:  None   Discharge Exam:  Complaints:  No more numbness of the feet. Had mild nausea this morning but no vomiting. Denies abdominal pain. As per nursing, patient doing well and ambulating comfortably in the room.  Filed Vitals:   01/10/15 0545 01/10/15 0800 01/10/15 0927 01/10/15 1410  BP: 222/78 205/77 189/78 160/68  Pulse:   94 74  Temp:   99.1 F (37.3 C) 98.5 F (36.9 C)  TempSrc:   Oral Oral  Resp:   18 18  Height:      Weight:      SpO2:   96% 98%    General exam: Pleasant middle-aged female lying comfortably in bed this morning. Respiratory system: Clear. No increased work of breathing. Cardiovascular system: S1 & S2 heard, RRR. No JVD, gallops, clicks or pedal edema. 2/5 systolic murmur at apex.  Gastrointestinal system: Abdomen is nondistended, soft and nontender. Normal bowel sounds heard. Central nervous system: Alert and oriented. No focal neurological deficits. Extremities: Symmetric 5 x 5 power.  Discharge Instructions      Discharge Instructions    (HEART FAILURE PATIENTS) Call MD:  Anytime you have any of the following symptoms: 1) 3 pound weight gain in 24 hours or 5 pounds in 1 week 2) shortness of breath, with or without a dry hacking cough 3) swelling in the hands, feet or stomach 4) if you have to sleep on extra pillows at night in order to breathe.    Complete by:  As directed      Ambulatory referral to Neurology    Complete by:  As directed   An appointment is requested in approximately: 8 weeks     Call MD for:  difficulty breathing, headache or visual disturbances    Complete by:  As directed      Call MD for:  extreme fatigue    Complete by:  As directed      Call MD for:  persistant dizziness or light-headedness    Complete by:  As directed      Call MD for:  persistant nausea and vomiting    Complete by:  As directed      Call MD for:  severe uncontrolled pain    Complete by:   As directed      Call MD for:  temperature >100.4    Complete by:  As directed      Call MD for:    Complete by:  As directed   strokelike symptoms.  strokelike symptoms.     Diet - low sodium heart healthy    Complete by:  As directed      Diet Carb Modified    Complete by:  As directed      Increase activity slowly    Complete by:  As directed             Medication List    STOP taking these medications        furosemide 40 MG tablet  Commonly known as:  LASIX      TAKE these medications        amLODipine-benazepril 10-40 MG capsule  Commonly known as:  LOTREL  Take 1 capsule by mouth daily.     aspirin 325 MG tablet  Take 325 mg by mouth daily.  atorvastatin 40 MG tablet  Commonly known as:  LIPITOR  Take 2 tablets (80 mg total) by mouth daily.     cloNIDine 0.2 MG tablet  Commonly known as:  CATAPRES  Take 1 tablet (0.2 mg total) by mouth 3 (three) times daily.     metFORMIN 750 MG 24 hr tablet  Commonly known as:  GLUCOPHAGE-XR  Take 1 tablet (750 mg total) by mouth 2 (two) times daily.     metoprolol succinate 100 MG 24 hr tablet  Commonly known as:  TOPROL-XL  Take 100 mg by mouth daily. Take with or immediately following a meal.     minoxidil 2.5 MG tablet  Commonly known as:  LONITEN  Take 2.5 mg by mouth daily.       Follow-up Information    Follow up with [name], [place]. Schedule an appointment as soon as possible for a visit in 3 days.   Specialty:  Family Medicine   Why:  To be seen with repeat labs (CBC & BMP).   Contact information:   [place] [id number] [phone]       Follow up with SETHI,PRAMOD, MD. Schedule an appointment as soon as possible for a visit in 2 months.   Specialties:  Neurology, Radiology   Contact information:   [address] [place] [address] [phone]        The results of significant diagnostics from this hospitalization (including imaging, microbiology,  ancillary and laboratory) are listed below for reference.    Significant Diagnostic Studies: Dg Chest 2 View  01/08/2015  CLINICAL DATA:  Stroke. EXAM: CHEST  2 VIEW COMPARISON:  September 14, 2013. FINDINGS: Stable cardiomediastinal silhouette. No pneumothorax or pleural effusion is noted. Multilevel degenerative disc disease is noted in the lower thoracic spine. No acute pulmonary disease is noted. IMPRESSION: No active cardiopulmonary disease. Electronically Signed   By: Marijo Conception, M.D.   On: 01/08/2015 19:30   Mr Brain Wo Contrast (neuro Protocol)  01/08/2015  CLINICAL DATA:  Numbness right foot and arm.  Right pronator drift. EXAM: MRI HEAD WITHOUT CONTRAST TECHNIQUE: Multiplanar, multiecho pulse sequences of the brain and surrounding structures were obtained without intravenous contrast. COMPARISON:  None. FINDINGS: Acute infarct left lateral thalamus in the region of the lateral geniculate body. This measures approximately 10 x 15 mm. No other area of acute infarct is identified. Periventricular and deep white matter hyperintensities bilaterally most consistent with chronic ischemia. Chronic lacunar infarct in the left anterior thalamus. Chronic infarct in the right pons with diffuse microvascular ischemic changes in the pons and brachium pontis bilaterally. Negative for hemorrhage or mass Ventricle size normal.  Pituitary normal in size Mild mucosal edema paranasal sinuses.  No mass in the orbit. IMPRESSION: Acute infarct in the left lateral geniculate body. Moderate chronic ischemic changes in the cerebral white matter, basal ganglia, and pons. Electronically Signed   By: Franchot Gallo M.D.   On: 01/08/2015 13:33   Mr Jodene Nam Head/brain Wo Cm  01/08/2015  CLINICAL DATA:  Numbness RIGHT foot and arm. RIGHT pronator drift. Follow-up. EXAM: MRA HEAD WITHOUT CONTRAST TECHNIQUE: Angiographic images of the Circle of Willis were obtained using MRA technique without intravenous contrast. COMPARISON:   MRI head January 08, 2015 at 1300 hours. FINDINGS: Moderately motion degraded examination resulting in duplicated appearance of the intracranial vessels. Anterior circulation: Normal flow related enhancement of the included cervical, petrous, cavernous and supraclinoid internal carotid arteries. Patent anterior communicating artery.  Normal flow related enhancement of the anterior and middle cerebral arteries, including distal segments. No large vessel occlusion, definite high-grade stenosis, aneurysm. Posterior circulation: Codominant vertebral arteries. Basilar artery is patent, with normal flow related enhancement of the main branch vessels. Normal flow related enhancement of the posterior cerebral arteries. No large vessel occlusion, definite high-grade stenosis, aneurysm. IMPRESSION: No acute large vessel occlusion or definite high-grade stenosis on this moderately motion degraded examination. Electronically Signed   By: Elon Alas M.D.   On: 01/08/2015 19:37    Microbiology: No results found for this or any previous visit (from the past 240 hour(s)).   Labs: Basic Metabolic Panel:  Recent Labs Lab 01/08/15 1233 01/09/15 0527 01/10/15 0225  NA 132* 134* 140  K 4.4 4.2 3.9  CL 101 106 110  CO2 22 21* 23  GLUCOSE 259* 200* 174*  BUN 48* 39* 24*  CREATININE 2.24* 1.75* 1.41*  CALCIUM 8.6* 8.0* 8.6*   Liver Function Tests: No results for input(s): AST, ALT, ALKPHOS, BILITOT, PROT, ALBUMIN in the last 168 hours. No results for input(s): LIPASE, AMYLASE in the last 168 hours. No results for input(s): AMMONIA in the last 168 hours. CBC:  Recent Labs Lab 01/08/15 1233 01/09/15 0527 01/10/15 0225  WBC 4.5 4.2 5.3  NEUTROABS 2.4  --   --   HGB 9.7* 9.3* 9.9*  HCT 28.9* 28.0* 29.0*  MCV 88.9 89.7 90.1  PLT 207 206 214   Cardiac Enzymes: No results for input(s): CKTOTAL, CKMB, CKMBINDEX, TROPONINI in the last 168 hours. BNP: BNP (last 3 results) No results for input(s):  BNP in the last 8760 hours.  ProBNP (last 3 results) No results for input(s): PROBNP in the last 8760 hours.  CBG:  Recent Labs Lab 01/09/15 1123 01/09/15 1649 01/09/15 2209 01/10/15 0644 01/10/15 1103  GLUCAP 159* 146* 202* 138* 161*      Signed:  Vernell Leep, MD, FACP, FHM. [facility] Pager [phone]  If 7PM-7AM, please contact night-coverage [email or web address] Password [email or web address] 01/10/2015, 3:13 PM

## 2015-01-15 ENCOUNTER — Ambulatory Visit (INDEPENDENT_AMBULATORY_CARE_PROVIDER_SITE_OTHER): Payer: No Typology Code available for payment source | Admitting: Family

## 2015-01-15 ENCOUNTER — Encounter: Payer: Self-pay | Admitting: Family

## 2015-01-15 ENCOUNTER — Other Ambulatory Visit (INDEPENDENT_AMBULATORY_CARE_PROVIDER_SITE_OTHER): Payer: No Typology Code available for payment source

## 2015-01-15 VITALS — BP 150/70 | HR 68 | Temp 98.4°F | Resp 18 | Ht 63.5 in | Wt 140.0 lb

## 2015-01-15 DIAGNOSIS — I639 Cerebral infarction, unspecified: Secondary | ICD-10-CM | POA: Diagnosis not present

## 2015-01-15 DIAGNOSIS — E785 Hyperlipidemia, unspecified: Secondary | ICD-10-CM

## 2015-01-15 DIAGNOSIS — I10 Essential (primary) hypertension: Secondary | ICD-10-CM

## 2015-01-15 DIAGNOSIS — E1351 Other specified diabetes mellitus with diabetic peripheral angiopathy without gangrene: Secondary | ICD-10-CM

## 2015-01-15 DIAGNOSIS — E0590 Thyrotoxicosis, unspecified without thyrotoxic crisis or storm: Secondary | ICD-10-CM | POA: Diagnosis not present

## 2015-01-15 DIAGNOSIS — Z9114 Patient's other noncompliance with medication regimen: Secondary | ICD-10-CM

## 2015-01-15 LAB — LIPID PANEL
CHOL/HDL RATIO: 5
Cholesterol: 126 mg/dL (ref 0–200)
HDL: 26 mg/dL — AB (ref >39.00)
LDL Cholesterol: 75 mg/dL (ref 0–99)
NONHDL: 99.52
Triglycerides: 124 mg/dL (ref 0.0–149.0)
VLDL: 24.8 mg/dL (ref 0.0–40.0)

## 2015-01-15 LAB — CBC
HEMATOCRIT: 27.6 % — AB (ref 36.0–46.0)
HEMOGLOBIN: 9.3 g/dL — AB (ref 12.0–15.0)
MCHC: 33.6 g/dL (ref 30.0–36.0)
MCV: 92.4 fl (ref 78.0–100.0)
PLATELETS: 269 10*3/uL (ref 150.0–400.0)
RBC: 2.99 Mil/uL — AB (ref 3.87–5.11)
RDW: 12.8 % (ref 11.5–15.5)
WBC: 5.2 10*3/uL (ref 4.0–10.5)

## 2015-01-15 LAB — COMPREHENSIVE METABOLIC PANEL
ALT: 13 U/L (ref 0–35)
AST: 14 U/L (ref 0–37)
Albumin: 3 g/dL — ABNORMAL LOW (ref 3.5–5.2)
Alkaline Phosphatase: 95 U/L (ref 39–117)
BUN: 51 mg/dL — ABNORMAL HIGH (ref 6–23)
CALCIUM: 8.7 mg/dL (ref 8.4–10.5)
CHLORIDE: 102 meq/L (ref 96–112)
CO2: 23 meq/L (ref 19–32)
CREATININE: 1.82 mg/dL — AB (ref 0.40–1.20)
GFR: 35.86 mL/min — AB (ref >60.00)
Glucose, Bld: 237 mg/dL — ABNORMAL HIGH (ref 70–99)
Potassium: 4 mEq/L (ref 3.5–5.1)
Sodium: 132 mEq/L — ABNORMAL LOW (ref 135–145)
Total Bilirubin: 0.9 mg/dL (ref 0.2–1.2)
Total Protein: 6.5 g/dL (ref 6.0–8.3)

## 2015-01-15 LAB — TSH: TSH: 1.08 u[IU]/mL (ref 0.35–4.50)

## 2015-01-15 MED ORDER — CLONIDINE HCL 0.2 MG PO TABS: 0.2000 mg | ORAL_TABLET | Freq: Three times a day (TID) | ORAL | Status: DC

## 2015-01-15 MED ORDER — METOPROLOL SUCCINATE ER 100 MG PO TB24: 100.0000 mg | ORAL_TABLET | Freq: Every day | ORAL | Status: DC

## 2015-01-15 MED ORDER — AMLODIPINE BESYLATE 10 MG PO TABS: 10.0000 mg | ORAL_TABLET | Freq: Every day | ORAL | Status: DC

## 2015-01-15 MED ORDER — METFORMIN HCL ER 750 MG PO TB24: 750.0000 mg | ORAL_TABLET | Freq: Two times a day (BID) | ORAL | Status: DC

## 2015-01-15 NOTE — Progress Notes (Signed)
Pre visit review using our clinic review tool, if applicable. No additional management support is needed unless otherwise documented below in the visit note. 

## 2015-01-15 NOTE — Progress Notes (Signed)
Subjective:    Patient ID: [name], female    DOB: August 29, 1950, 64 y.o.   MRN: [id number]  Chief Complaint  Patient presents with  . Hospitalization Follow-up    doing alot better but still has numbness on the right side    HPI:  [name] is a 64 y.o. female who  has a past medical history of Hypertension; History of sebaceous cyst; Tobacco abuse; Cough; Hyperthyroidism; Hyperlipemia; Diabetes mellitus, type 2 ([place]); Chronic back pain; Lumbar radiculopathy; Shortness of breath (09/13/2013); GERD (gastroesophageal reflux disease); and Stroke ([place]). and presents today for an office follow up.  Recently seen in the ED and admitted to the hospital for foot numbness and noted the time that she had been off her medications for a week secondary to financial reasons. Physical exam showed no acute distress with vital signs stable and mildly hypertensive. There is slight pronator drift and mildly decreased sensation in the right upper arm. MRI of the brain was obtained for concern of stroke. Labs were significant for an elevated creatinine of 2.24, glucose of 259, and an MRI with the results of acute infarct in the left lateral geniculate body. She was discharged from the hospital with no acute deficits with an MRI that was unremarkable. Carotid Doppler with no evidence of significant stenosis and a 2-D echo with an ejection fraction of 65-70% and no evidence of embolism. Her hemoglobin A1c was 11.2. All hospital records and labs were reviewed in detail.  Since leaving the hospital she reports that she is doing better and does continue to experience some mild right side numbness in her face, leg and arm. She is able to complete her activities of daily living. She is working with physical therapy to improve her function and mobility. She is eating well.  1.) Hypertension - currently maintained on minoxidil, metoprolol, clonidine, and amlodipine-benazepril. She indicates she takes the  medication as prescribed and denies adverse side effects. Does not currently monitor her blood pressure at home. Denies additional symptoms of end organ damage other than the numbness from her recent ischemic stroke.  BP Readings from Last 3 Encounters:  01/15/15 150/70  01/10/15 160/68  10/07/14 224/108   2.) Diabetes - Currently maintained on metformin. Indicates she takes the medication as prescribed. Does not currently take her blood sugar at home as she does not have a meter.   Lab Results  Component Value Date   HGBA1C 11.2* 01/08/2015    Estimated Creatinine Clearance: 26.4 mL/min (by C-G formula based on Cr of 1.82).   3.) Hyperlipidemia -  Currently maintained on atorvastatin. Indicates she takes medication as prescribed and denies adverse side effects.  Lipid goal of LDL less than 70.  Lab Results  Component Value Date   CHOL 126 01/15/2015   HDL 26.00* 01/15/2015   LDLCALC 75 01/15/2015   LDLDIRECT 130.4 12/13/2012   TRIG 124.0 01/15/2015   CHOLHDL 5 01/15/2015    No Known Allergies   Current Outpatient Prescriptions on File Prior to Visit  Medication Sig Dispense Refill  . aspirin 325 MG tablet Take 325 mg by mouth daily.    [name] atorvastatin (LIPITOR) 40 MG tablet Take 2 tablets (80 mg total) by mouth daily. 60 tablet 0  . minoxidil (LONITEN) 2.5 MG tablet Take 2.5 mg by mouth daily.     No current facility-administered medications on file prior to visit.     Past Surgical History  Procedure Laterality Date  . Retinopathy surgery  2013    Dr. Zigmund Daniel  . Abdominal hysterectomy      Past Medical History  Diagnosis Date  . Hypertension   . History of sebaceous cyst   . Tobacco abuse   . Cough   . Hyperthyroidism   . Hyperlipemia   . Diabetes mellitus, type 2 ([place])   . Chronic back pain   . Lumbar radiculopathy   . Shortness of breath 09/13/2013  . GERD (gastroesophageal reflux disease)   . Stroke [facility])     Review of Systems  Eyes:        Denies changes in vision  Respiratory: Negative for chest tightness.   Cardiovascular: Negative for chest pain, palpitations and leg swelling.  Endocrine: Negative for polydipsia, polyphagia and polyuria.  Neurological: Positive for numbness. Negative for weakness.      Objective:    BP 150/70 mmHg  Pulse 68  Temp(Src) 98.4 F (36.9 C) (Oral)  Resp 18  Ht 5' 3.5" (1.613 m)  Wt 140 lb (63.504 kg)  BMI 24.41 kg/m2  SpO2 96% Nursing note and vital signs reviewed.  Physical Exam  Constitutional: She is oriented to person, place, and time. She appears well-developed and well-nourished. No distress.  Eyes: Conjunctivae and EOM are normal. Pupils are equal, round, and reactive to light.  Cardiovascular: Normal rate, regular rhythm, normal heart sounds and intact distal pulses.   Pulmonary/Chest: Effort normal and breath sounds normal.  Musculoskeletal:  Right upper/lower extremity - no obvious deformity, discoloration, or edema. Age motion is comparable to the contralateral side in all directions. Mild decrease in sensation noted on the right side.  Neurological: She is alert and oriented to person, place, and time. No cranial nerve deficit.  Skin: Skin is warm and dry.  Psychiatric: She has a normal mood and affect. Her behavior is normal. Judgment and thought content normal.       Assessment & Plan:   Problem List Items Addressed This Visit      Cardiovascular and Mediastinum   Secondary diabetes with peripheral vascular disease ([place])    Type 2 diabetes remains uncontrolled with A1c of 11.2. She is currently maintained on metformin questionable compliance with medication regimen. States she has taken her medication as prescribed however medication has not been filled in the last 6 months. Emphasize importance of medication regimen adherence to reduce risk for additional strokes and cardiovascular disease and end organ damage. Instructed to monitor blood sugars at home with meter  provided. She'll check with her new insurance to determine what strips will be covered in the future. Follow-up in one month to determine blood sugar and risk factor control. Will consider addition of low dose lisinopril secondary to kidney function, however concern for compliance with additional medication.      Relevant Medications   cloNIDine (CATAPRES) 0.2 MG tablet   amLODipine (NORVASC) 10 MG tablet   metFORMIN (GLUCOPHAGE-XR) 750 MG 24 hr tablet   metoprolol succinate (TOPROL-XL) 100 MG 24 hr tablet   Other Relevant Orders   Comp Met (CMET) (Completed)   CBC (Completed)   Essential hypertension - Primary    Hypertension remains uncontrolled with current blood pressure greater than 140/90. There is concern for noncompliance with medical regimen secondary to refills of medications as is provider has not refilled her medications in several months. Encouraged compliance with medication regimen to reduce the risk of end organ damage and prevent future strokes. Continue to monitor blood pressure at home as able. Continue current dosage of minoxidil,  metoprolol, clonidine, and amlodipine. Follow-up in one month to determine improve compliance and blood pressure control.      Relevant Medications   cloNIDine (CATAPRES) 0.2 MG tablet   amLODipine (NORVASC) 10 MG tablet   metoprolol succinate (TOPROL-XL) 100 MG 24 hr tablet   Other Relevant Orders   Comp Met (CMET) (Completed)   CBC (Completed)   Acute ischemic stroke [facility])    Recently discharged from the hospital with new acute ischemic stroke with no significant remaining deficits. This is her second stroke. Discussed importance of risk factor control and increased risk of potential for future stroke. Continue current dosage of metoprolol, metformin, atorvastatin, and amlodipine to control stroke risk factors. Follow-up with neurology as scheduled.      Relevant Medications   cloNIDine (CATAPRES) 0.2 MG tablet   amLODipine (NORVASC) 10 MG  tablet   metoprolol succinate (TOPROL-XL) 100 MG 24 hr tablet   Other Relevant Orders   Comp Met (CMET) (Completed)   CBC (Completed)     Endocrine   Hyperthyroidism   Relevant Medications   metoprolol succinate (TOPROL-XL) 100 MG 24 hr tablet   Other Relevant Orders   TSH (Completed)     Other   Hyperlipidemia LDL goal <70    Hyperlipidemia goal of less than 70. Currently maintained on atorvastatin. Denies myalgias. Continue current dosage of atorvastatin and obtain lipid profile.      Relevant Medications   cloNIDine (CATAPRES) 0.2 MG tablet   amLODipine (NORVASC) 10 MG tablet   metoprolol succinate (TOPROL-XL) 100 MG 24 hr tablet   Non compliance w medication regimen

## 2015-01-15 NOTE — Patient Instructions (Addendum)
Thank you for choosing [facility].  Summary/Instructions:  Your prescription(s) have been submitted to your pharmacy or been printed and provided for you. Please take as directed and contact our office if you believe you are having problem(s) with the medication(s) or have any questions.  Please stop by the lab on the basement level of the building for your blood work. Your results will be released to [place] (or called to you) after review, usually within 72 hours after test completion. If any changes need to be made, you will be notified at that same time.  If your symptoms worsen or fail to improve, please contact our office for further instruction, or in case of emergency go directly to the emergency room at the closest medical facility.   Please take your medications as prescribed and continue to work with physical therapy.

## 2015-01-16 DIAGNOSIS — Z91148 Patient's other noncompliance with medication regimen for other reason: Secondary | ICD-10-CM | POA: Insufficient documentation

## 2015-01-16 DIAGNOSIS — Z9114 Patient's other noncompliance with medication regimen: Secondary | ICD-10-CM | POA: Insufficient documentation

## 2015-01-16 NOTE — Assessment & Plan Note (Signed)
Recently discharged from the hospital with new acute ischemic stroke with no significant remaining deficits. This is her second stroke. Discussed importance of risk factor control and increased risk of potential for future stroke. Continue current dosage of metoprolol, metformin, atorvastatin, and amlodipine to control stroke risk factors. Follow-up with neurology as scheduled.

## 2015-01-16 NOTE — Assessment & Plan Note (Addendum)
Type 2 diabetes remains uncontrolled with A1c of 11.2. She is currently maintained on metformin questionable compliance with medication regimen. States she has taken her medication as prescribed however medication has not been filled in the last 6 months. Emphasize importance of medication regimen adherence to reduce risk for additional strokes and cardiovascular disease and end organ damage. Instructed to monitor blood sugars at home with meter provided. She'll check with her new insurance to determine what strips will be covered in the future. Follow-up in one month to determine blood sugar and risk factor control. Will consider addition of low dose lisinopril secondary to kidney function, however concern for compliance with additional medication.

## 2015-01-16 NOTE — Assessment & Plan Note (Addendum)
Hypertension remains uncontrolled with current blood pressure greater than 140/90. There is concern for noncompliance with medical regimen secondary to refills of medications as is provider has not refilled her medications in several months. Encouraged compliance with medication regimen to reduce the risk of end organ damage and prevent future strokes. Continue to monitor blood pressure at home as able. Continue current dosage of minoxidil, metoprolol, clonidine, and amlodipine. Follow-up in one month to determine improve compliance and blood pressure control.

## 2015-01-16 NOTE — Assessment & Plan Note (Signed)
Hyperlipidemia goal of less than 70. Currently maintained on atorvastatin. Denies myalgias. Continue current dosage of atorvastatin and obtain lipid profile.

## 2015-01-21 ENCOUNTER — Telehealth: Payer: Self-pay | Admitting: Family

## 2015-01-21 NOTE — Telephone Encounter (Signed)
Please inform patient that her blood work shows that her LDL or bad cholesterol is close to goal at 75 and her HDL or good cholesterol is low at 26 which revealed like to be higher than 39. Therefore please continue to take atorvastatin as prescribed. Her thyroid function is within the normal range. Her kidney function is also decreased which can be protected through controlling her blood sugar and blood pressure. Her liver function and electrolytes look okay. Please continue to drink plenty of fluids and we'll follow-up in one month for blood pressure.

## 2015-01-23 NOTE — Telephone Encounter (Signed)
Patient called back in regards.  Please follow up with

## 2015-01-23 NOTE — Telephone Encounter (Signed)
Pt aware.

## 2015-01-23 NOTE — Telephone Encounter (Signed)
LVM for pt to call back about results.

## 2015-01-26 DIAGNOSIS — E1151 Type 2 diabetes mellitus with diabetic peripheral angiopathy without gangrene: Secondary | ICD-10-CM | POA: Diagnosis not present

## 2015-01-26 DIAGNOSIS — E1122 Type 2 diabetes mellitus with diabetic chronic kidney disease: Secondary | ICD-10-CM | POA: Diagnosis not present

## 2015-01-26 DIAGNOSIS — D649 Anemia, unspecified: Secondary | ICD-10-CM | POA: Diagnosis not present

## 2015-01-26 DIAGNOSIS — Z720 Tobacco use: Secondary | ICD-10-CM | POA: Diagnosis not present

## 2015-01-26 DIAGNOSIS — N183 Chronic kidney disease, stage 3 (moderate): Secondary | ICD-10-CM | POA: Diagnosis not present

## 2015-01-26 DIAGNOSIS — Z7982 Long term (current) use of aspirin: Secondary | ICD-10-CM | POA: Diagnosis not present

## 2015-01-26 DIAGNOSIS — Z7984 Long term (current) use of oral hypoglycemic drugs: Secondary | ICD-10-CM | POA: Diagnosis not present

## 2015-01-26 DIAGNOSIS — I129 Hypertensive chronic kidney disease with stage 1 through stage 4 chronic kidney disease, or unspecified chronic kidney disease: Secondary | ICD-10-CM | POA: Diagnosis not present

## 2015-01-26 DIAGNOSIS — I69351 Hemiplegia and hemiparesis following cerebral infarction affecting right dominant side: Secondary | ICD-10-CM | POA: Diagnosis not present

## 2015-01-26 DIAGNOSIS — E785 Hyperlipidemia, unspecified: Secondary | ICD-10-CM | POA: Diagnosis not present

## 2015-01-30 DIAGNOSIS — E785 Hyperlipidemia, unspecified: Secondary | ICD-10-CM | POA: Diagnosis not present

## 2015-01-30 DIAGNOSIS — E1122 Type 2 diabetes mellitus with diabetic chronic kidney disease: Secondary | ICD-10-CM | POA: Diagnosis not present

## 2015-01-30 DIAGNOSIS — Z7984 Long term (current) use of oral hypoglycemic drugs: Secondary | ICD-10-CM | POA: Diagnosis not present

## 2015-01-30 DIAGNOSIS — Z7982 Long term (current) use of aspirin: Secondary | ICD-10-CM | POA: Diagnosis not present

## 2015-01-30 DIAGNOSIS — E1151 Type 2 diabetes mellitus with diabetic peripheral angiopathy without gangrene: Secondary | ICD-10-CM | POA: Diagnosis not present

## 2015-01-30 DIAGNOSIS — I69351 Hemiplegia and hemiparesis following cerebral infarction affecting right dominant side: Secondary | ICD-10-CM | POA: Diagnosis not present

## 2015-01-30 DIAGNOSIS — Z720 Tobacco use: Secondary | ICD-10-CM | POA: Diagnosis not present

## 2015-01-30 DIAGNOSIS — I129 Hypertensive chronic kidney disease with stage 1 through stage 4 chronic kidney disease, or unspecified chronic kidney disease: Secondary | ICD-10-CM | POA: Diagnosis not present

## 2015-01-30 DIAGNOSIS — D649 Anemia, unspecified: Secondary | ICD-10-CM | POA: Diagnosis not present

## 2015-01-30 DIAGNOSIS — N183 Chronic kidney disease, stage 3 (moderate): Secondary | ICD-10-CM | POA: Diagnosis not present

## 2015-02-02 DIAGNOSIS — I69351 Hemiplegia and hemiparesis following cerebral infarction affecting right dominant side: Secondary | ICD-10-CM | POA: Diagnosis not present

## 2015-02-02 DIAGNOSIS — I129 Hypertensive chronic kidney disease with stage 1 through stage 4 chronic kidney disease, or unspecified chronic kidney disease: Secondary | ICD-10-CM | POA: Diagnosis not present

## 2015-02-02 DIAGNOSIS — E1122 Type 2 diabetes mellitus with diabetic chronic kidney disease: Secondary | ICD-10-CM | POA: Diagnosis not present

## 2015-02-02 DIAGNOSIS — Z720 Tobacco use: Secondary | ICD-10-CM | POA: Diagnosis not present

## 2015-02-02 DIAGNOSIS — D649 Anemia, unspecified: Secondary | ICD-10-CM | POA: Diagnosis not present

## 2015-02-02 DIAGNOSIS — N183 Chronic kidney disease, stage 3 (moderate): Secondary | ICD-10-CM | POA: Diagnosis not present

## 2015-02-02 DIAGNOSIS — E785 Hyperlipidemia, unspecified: Secondary | ICD-10-CM | POA: Diagnosis not present

## 2015-02-02 DIAGNOSIS — E1151 Type 2 diabetes mellitus with diabetic peripheral angiopathy without gangrene: Secondary | ICD-10-CM | POA: Diagnosis not present

## 2015-02-02 DIAGNOSIS — Z7984 Long term (current) use of oral hypoglycemic drugs: Secondary | ICD-10-CM | POA: Diagnosis not present

## 2015-02-02 DIAGNOSIS — Z7982 Long term (current) use of aspirin: Secondary | ICD-10-CM | POA: Diagnosis not present

## 2015-02-04 DIAGNOSIS — I69351 Hemiplegia and hemiparesis following cerebral infarction affecting right dominant side: Secondary | ICD-10-CM | POA: Diagnosis not present

## 2015-02-04 DIAGNOSIS — E785 Hyperlipidemia, unspecified: Secondary | ICD-10-CM | POA: Diagnosis not present

## 2015-02-04 DIAGNOSIS — E1122 Type 2 diabetes mellitus with diabetic chronic kidney disease: Secondary | ICD-10-CM | POA: Diagnosis not present

## 2015-02-04 DIAGNOSIS — D649 Anemia, unspecified: Secondary | ICD-10-CM | POA: Diagnosis not present

## 2015-02-04 DIAGNOSIS — Z7984 Long term (current) use of oral hypoglycemic drugs: Secondary | ICD-10-CM | POA: Diagnosis not present

## 2015-02-04 DIAGNOSIS — N183 Chronic kidney disease, stage 3 (moderate): Secondary | ICD-10-CM | POA: Diagnosis not present

## 2015-02-04 DIAGNOSIS — E1151 Type 2 diabetes mellitus with diabetic peripheral angiopathy without gangrene: Secondary | ICD-10-CM | POA: Diagnosis not present

## 2015-02-04 DIAGNOSIS — I129 Hypertensive chronic kidney disease with stage 1 through stage 4 chronic kidney disease, or unspecified chronic kidney disease: Secondary | ICD-10-CM | POA: Diagnosis not present

## 2015-02-04 DIAGNOSIS — Z720 Tobacco use: Secondary | ICD-10-CM | POA: Diagnosis not present

## 2015-02-04 DIAGNOSIS — Z7982 Long term (current) use of aspirin: Secondary | ICD-10-CM | POA: Diagnosis not present

## 2015-02-05 ENCOUNTER — Other Ambulatory Visit: Payer: Self-pay | Admitting: Family

## 2015-02-09 ENCOUNTER — Telehealth: Payer: Self-pay | Admitting: Family

## 2015-02-09 DIAGNOSIS — E1151 Type 2 diabetes mellitus with diabetic peripheral angiopathy without gangrene: Secondary | ICD-10-CM | POA: Diagnosis not present

## 2015-02-09 DIAGNOSIS — Z720 Tobacco use: Secondary | ICD-10-CM | POA: Diagnosis not present

## 2015-02-09 DIAGNOSIS — E785 Hyperlipidemia, unspecified: Secondary | ICD-10-CM | POA: Diagnosis not present

## 2015-02-09 DIAGNOSIS — I69351 Hemiplegia and hemiparesis following cerebral infarction affecting right dominant side: Secondary | ICD-10-CM | POA: Diagnosis not present

## 2015-02-09 DIAGNOSIS — D649 Anemia, unspecified: Secondary | ICD-10-CM | POA: Diagnosis not present

## 2015-02-09 DIAGNOSIS — Z7982 Long term (current) use of aspirin: Secondary | ICD-10-CM | POA: Diagnosis not present

## 2015-02-09 DIAGNOSIS — E1122 Type 2 diabetes mellitus with diabetic chronic kidney disease: Secondary | ICD-10-CM | POA: Diagnosis not present

## 2015-02-09 DIAGNOSIS — Z7984 Long term (current) use of oral hypoglycemic drugs: Secondary | ICD-10-CM | POA: Diagnosis not present

## 2015-02-09 DIAGNOSIS — N183 Chronic kidney disease, stage 3 (moderate): Secondary | ICD-10-CM | POA: Diagnosis not present

## 2015-02-09 DIAGNOSIS — I129 Hypertensive chronic kidney disease with stage 1 through stage 4 chronic kidney disease, or unspecified chronic kidney disease: Secondary | ICD-10-CM | POA: Diagnosis not present

## 2015-02-09 MED ORDER — GLUCOSE BLOOD VI STRP: ORAL_STRIP | Status: DC

## 2015-02-09 NOTE — Telephone Encounter (Signed)
Pt called in requesting prescription for her One Touch Verio Flex strips Pharmacy is [facility] on [address]

## 2015-02-09 NOTE — Telephone Encounter (Signed)
Strips sent

## 2015-02-11 DIAGNOSIS — N183 Chronic kidney disease, stage 3 (moderate): Secondary | ICD-10-CM | POA: Diagnosis not present

## 2015-02-11 DIAGNOSIS — I69351 Hemiplegia and hemiparesis following cerebral infarction affecting right dominant side: Secondary | ICD-10-CM | POA: Diagnosis not present

## 2015-02-11 DIAGNOSIS — E1151 Type 2 diabetes mellitus with diabetic peripheral angiopathy without gangrene: Secondary | ICD-10-CM | POA: Diagnosis not present

## 2015-02-11 DIAGNOSIS — E785 Hyperlipidemia, unspecified: Secondary | ICD-10-CM | POA: Diagnosis not present

## 2015-02-11 DIAGNOSIS — Z720 Tobacco use: Secondary | ICD-10-CM | POA: Diagnosis not present

## 2015-02-11 DIAGNOSIS — D649 Anemia, unspecified: Secondary | ICD-10-CM | POA: Diagnosis not present

## 2015-02-11 DIAGNOSIS — Z7984 Long term (current) use of oral hypoglycemic drugs: Secondary | ICD-10-CM | POA: Diagnosis not present

## 2015-02-11 DIAGNOSIS — I129 Hypertensive chronic kidney disease with stage 1 through stage 4 chronic kidney disease, or unspecified chronic kidney disease: Secondary | ICD-10-CM | POA: Diagnosis not present

## 2015-02-11 DIAGNOSIS — E1122 Type 2 diabetes mellitus with diabetic chronic kidney disease: Secondary | ICD-10-CM | POA: Diagnosis not present

## 2015-02-11 DIAGNOSIS — Z7982 Long term (current) use of aspirin: Secondary | ICD-10-CM | POA: Diagnosis not present

## 2015-02-16 DIAGNOSIS — E785 Hyperlipidemia, unspecified: Secondary | ICD-10-CM | POA: Diagnosis not present

## 2015-02-16 DIAGNOSIS — I129 Hypertensive chronic kidney disease with stage 1 through stage 4 chronic kidney disease, or unspecified chronic kidney disease: Secondary | ICD-10-CM | POA: Diagnosis not present

## 2015-02-16 DIAGNOSIS — Z7984 Long term (current) use of oral hypoglycemic drugs: Secondary | ICD-10-CM | POA: Diagnosis not present

## 2015-02-16 DIAGNOSIS — E1122 Type 2 diabetes mellitus with diabetic chronic kidney disease: Secondary | ICD-10-CM | POA: Diagnosis not present

## 2015-02-16 DIAGNOSIS — I69351 Hemiplegia and hemiparesis following cerebral infarction affecting right dominant side: Secondary | ICD-10-CM | POA: Diagnosis not present

## 2015-02-16 DIAGNOSIS — D649 Anemia, unspecified: Secondary | ICD-10-CM | POA: Diagnosis not present

## 2015-02-16 DIAGNOSIS — E1151 Type 2 diabetes mellitus with diabetic peripheral angiopathy without gangrene: Secondary | ICD-10-CM | POA: Diagnosis not present

## 2015-02-16 DIAGNOSIS — Z720 Tobacco use: Secondary | ICD-10-CM | POA: Diagnosis not present

## 2015-02-16 DIAGNOSIS — N183 Chronic kidney disease, stage 3 (moderate): Secondary | ICD-10-CM | POA: Diagnosis not present

## 2015-02-16 DIAGNOSIS — Z7982 Long term (current) use of aspirin: Secondary | ICD-10-CM | POA: Diagnosis not present

## 2015-02-18 DIAGNOSIS — E1151 Type 2 diabetes mellitus with diabetic peripheral angiopathy without gangrene: Secondary | ICD-10-CM | POA: Diagnosis not present

## 2015-02-18 DIAGNOSIS — Z7984 Long term (current) use of oral hypoglycemic drugs: Secondary | ICD-10-CM | POA: Diagnosis not present

## 2015-02-18 DIAGNOSIS — Z7982 Long term (current) use of aspirin: Secondary | ICD-10-CM | POA: Diagnosis not present

## 2015-02-18 DIAGNOSIS — I69351 Hemiplegia and hemiparesis following cerebral infarction affecting right dominant side: Secondary | ICD-10-CM | POA: Diagnosis not present

## 2015-02-18 DIAGNOSIS — E785 Hyperlipidemia, unspecified: Secondary | ICD-10-CM | POA: Diagnosis not present

## 2015-02-18 DIAGNOSIS — N183 Chronic kidney disease, stage 3 (moderate): Secondary | ICD-10-CM | POA: Diagnosis not present

## 2015-02-18 DIAGNOSIS — D649 Anemia, unspecified: Secondary | ICD-10-CM | POA: Diagnosis not present

## 2015-02-18 DIAGNOSIS — I129 Hypertensive chronic kidney disease with stage 1 through stage 4 chronic kidney disease, or unspecified chronic kidney disease: Secondary | ICD-10-CM | POA: Diagnosis not present

## 2015-02-18 DIAGNOSIS — Z720 Tobacco use: Secondary | ICD-10-CM | POA: Diagnosis not present

## 2015-02-18 DIAGNOSIS — E1122 Type 2 diabetes mellitus with diabetic chronic kidney disease: Secondary | ICD-10-CM | POA: Diagnosis not present

## 2015-02-21 ENCOUNTER — Other Ambulatory Visit: Payer: Self-pay | Admitting: Family

## 2015-02-23 ENCOUNTER — Ambulatory Visit (INDEPENDENT_AMBULATORY_CARE_PROVIDER_SITE_OTHER): Payer: Medicare Other | Admitting: Family

## 2015-02-23 ENCOUNTER — Other Ambulatory Visit (INDEPENDENT_AMBULATORY_CARE_PROVIDER_SITE_OTHER): Payer: Medicare Other

## 2015-02-23 ENCOUNTER — Encounter: Payer: Self-pay | Admitting: Family

## 2015-02-23 ENCOUNTER — Telehealth: Payer: Self-pay | Admitting: Family

## 2015-02-23 VITALS — BP 140/94 | HR 61 | Temp 97.8°F | Resp 18 | Ht 63.5 in | Wt 136.8 lb

## 2015-02-23 DIAGNOSIS — Z720 Tobacco use: Secondary | ICD-10-CM | POA: Diagnosis not present

## 2015-02-23 DIAGNOSIS — Z7982 Long term (current) use of aspirin: Secondary | ICD-10-CM | POA: Diagnosis not present

## 2015-02-23 DIAGNOSIS — E1151 Type 2 diabetes mellitus with diabetic peripheral angiopathy without gangrene: Secondary | ICD-10-CM | POA: Diagnosis not present

## 2015-02-23 DIAGNOSIS — D649 Anemia, unspecified: Secondary | ICD-10-CM | POA: Diagnosis not present

## 2015-02-23 DIAGNOSIS — I69351 Hemiplegia and hemiparesis following cerebral infarction affecting right dominant side: Secondary | ICD-10-CM | POA: Diagnosis not present

## 2015-02-23 DIAGNOSIS — I129 Hypertensive chronic kidney disease with stage 1 through stage 4 chronic kidney disease, or unspecified chronic kidney disease: Secondary | ICD-10-CM | POA: Diagnosis not present

## 2015-02-23 DIAGNOSIS — E785 Hyperlipidemia, unspecified: Secondary | ICD-10-CM | POA: Diagnosis not present

## 2015-02-23 DIAGNOSIS — N183 Chronic kidney disease, stage 3 (moderate): Secondary | ICD-10-CM | POA: Diagnosis not present

## 2015-02-23 DIAGNOSIS — E1122 Type 2 diabetes mellitus with diabetic chronic kidney disease: Secondary | ICD-10-CM | POA: Diagnosis not present

## 2015-02-23 DIAGNOSIS — Z7984 Long term (current) use of oral hypoglycemic drugs: Secondary | ICD-10-CM | POA: Diagnosis not present

## 2015-02-23 DIAGNOSIS — I10 Essential (primary) hypertension: Secondary | ICD-10-CM | POA: Diagnosis not present

## 2015-02-23 LAB — BASIC METABOLIC PANEL
BUN: 22 mg/dL (ref 6–23)
CALCIUM: 8.7 mg/dL (ref 8.4–10.5)
CHLORIDE: 102 meq/L (ref 96–112)
CO2: 27 meq/L (ref 19–32)
CREATININE: 1.18 mg/dL (ref 0.40–1.20)
GFR: 59.11 mL/min — ABNORMAL LOW (ref >60.00)
GLUCOSE: 243 mg/dL — AB (ref 70–99)
Potassium: 4.1 mEq/L (ref 3.5–5.1)
Sodium: 135 mEq/L (ref 135–145)

## 2015-02-23 MED ORDER — LOSARTAN POTASSIUM 50 MG PO TABS: 50.0000 mg | ORAL_TABLET | Freq: Every day | ORAL | Status: DC

## 2015-02-23 NOTE — Progress Notes (Signed)
Pre visit review using our clinic review tool, if applicable. No additional management support is needed unless otherwise documented below in the visit note. 

## 2015-02-23 NOTE — Patient Instructions (Addendum)
Thank you for choosing [facility].  Summary/Instructions:  Please continue to take your medication as prescribed.  Please do not pick up the metformin until you hear back.  Please start taking Losartan in addition to your other medications.  Monitor your blood pressure at home.  Follow up nurse visit in 3 weeks for blood pressure assessment.  Your prescription(s) have been submitted to your pharmacy or been printed and provided for you. Please take as directed and contact our office if you believe you are having problem(s) with the medication(s) or have any questions.   Please stop by the lab on the basement level of the building for your blood work. Your results will be released to [place] (or called to you) after review, usually within 72 hours after test completion. If any changes need to be made, you will be notified at that same time.  If your symptoms worsen or fail to improve, please contact our office for further instruction, or in case of emergency go directly to the emergency room at the closest medical facility.

## 2015-02-23 NOTE — Progress Notes (Signed)
Subjective:    Patient ID: [name], female    DOB: Feb 16, 1950, 65 y.o.   MRN: [id number]  Chief Complaint  Patient presents with  . Hypertension    following up on BP, BP has been up over the last 2 weeks     HPI:  [name] is a 65 y.o. female who  has a past medical history of Hypertension; History of sebaceous cyst; Tobacco abuse; Cough; Hyperthyroidism; Hyperlipemia; Diabetes mellitus, type 2 ([place]); Chronic back pain; Lumbar radiculopathy; Shortness of breath (09/13/2013); GERD (gastroesophageal reflux disease); and Stroke ([place]). and presents today for a follow up office visit.  1.) Hypertension - Currently maintained on clonidine, minoxidil, amlodipine and metoprolol. Reports taking the medication as prescribed and denies adverse side effects or hypotensive readings. Denies symptoms of end organ damage. Reports that her blood pressure has been ranging at home in the 150s-160s.   BP Readings from Last 3 Encounters:  02/23/15 140/94  01/15/15 150/70  01/10/15 160/68    No Known Allergies   Current Outpatient Prescriptions on File Prior to Visit  Medication Sig Dispense Refill  . amLODipine (NORVASC) 10 MG tablet Take 1 tablet (10 mg total) by mouth daily. 30 tablet 0  . aspirin 325 MG tablet Take 325 mg by mouth daily.    [name] atorvastatin (LIPITOR) 80 MG tablet TAKE 1 TABLET BY MOUTH EVERY DAY(NEEDS PA) 30 tablet 0  . cloNIDine (CATAPRES) 0.2 MG tablet Take 1 tablet (0.2 mg total) by mouth 3 (three) times daily. 90 tablet 0  . glucose blood (ONETOUCH VERIO) test strip Use as instructed 100 each 12  . metFORMIN (GLUCOPHAGE-XR) 750 MG 24 hr tablet TAKE 1 TABLET (750 MG TOTAL) BY MOUTH 2 (TWO) TIMES DAILY. 60 tablet 0  . metoprolol succinate (TOPROL-XL) 100 MG 24 hr tablet Take 1 tablet (100 mg total) by mouth daily. Take with or immediately following a meal. 30 tablet 1  . minoxidil (LONITEN) 2.5 MG tablet Take 2.5 mg by mouth daily.     No current  facility-administered medications on file prior to visit.    Review of Systems  Eyes:       Negative for changes in vision.   Respiratory: Negative for chest tightness.   Cardiovascular: Negative for chest pain, palpitations and leg swelling.  Neurological: Positive for headaches.      Objective:    BP 140/94 mmHg  Pulse 61  Temp(Src) 97.8 F (36.6 C) (Oral)  Resp 18  Ht 5' 3.5" (1.613 m)  Wt 136 lb 12.8 oz (62.052 kg)  BMI 23.85 kg/m2  SpO2 99% Nursing note and vital signs reviewed.  Physical Exam  Constitutional: She is oriented to person, place, and time. She appears well-developed and well-nourished. No distress.  Cardiovascular: Normal rate, regular rhythm, normal heart sounds and intact distal pulses.   Pulmonary/Chest: Effort normal and breath sounds normal.  Neurological: She is alert and oriented to person, place, and time.  Skin: Skin is warm and dry.  Psychiatric: She has a normal mood and affect. Her behavior is normal. Judgment and thought content normal.      Assessment & Plan:   Problem List Items Addressed This Visit      Cardiovascular and Mediastinum   Hypertension - Primary    Blood pressure remains above goal 140/90 with current regimen. Patient appears to be more compliant with medication regimen. Home readings are elevated above goal 140/90. Continue current dosage of minoxidil, metoprolol, amlodipine and clonidine. Start  losartan. Obtain BMET. Continue to monitor blood pressure at home. Follow-up in 3 weeks for nurse visit.      Relevant Medications   losartan (COZAAR) 50 MG tablet   Other Relevant Orders   Basic Metabolic Panel (BMET)

## 2015-02-23 NOTE — Telephone Encounter (Signed)
Please inform patient is okay for her to continue to take her metformin and obtain her metformin refill. Overall her kidney function looks okay as well as her electrolytes. Please continue to take losartan as prescribed. And follow-up in about one month.

## 2015-02-23 NOTE — Assessment & Plan Note (Signed)
Blood pressure remains above goal 140/90 with current regimen. Patient appears to be more compliant with medication regimen. Home readings are elevated above goal 140/90. Continue current dosage of minoxidil, metoprolol, amlodipine and clonidine. Start losartan. Obtain BMET. Continue to monitor blood pressure at home. Follow-up in 3 weeks for nurse visit.

## 2015-02-25 ENCOUNTER — Telehealth: Payer: Self-pay | Admitting: Family

## 2015-02-25 DIAGNOSIS — E1151 Type 2 diabetes mellitus with diabetic peripheral angiopathy without gangrene: Secondary | ICD-10-CM | POA: Diagnosis not present

## 2015-02-25 DIAGNOSIS — Z720 Tobacco use: Secondary | ICD-10-CM | POA: Diagnosis not present

## 2015-02-25 DIAGNOSIS — E785 Hyperlipidemia, unspecified: Secondary | ICD-10-CM | POA: Diagnosis not present

## 2015-02-25 DIAGNOSIS — N183 Chronic kidney disease, stage 3 (moderate): Secondary | ICD-10-CM | POA: Diagnosis not present

## 2015-02-25 DIAGNOSIS — D649 Anemia, unspecified: Secondary | ICD-10-CM | POA: Diagnosis not present

## 2015-02-25 DIAGNOSIS — Z7982 Long term (current) use of aspirin: Secondary | ICD-10-CM | POA: Diagnosis not present

## 2015-02-25 DIAGNOSIS — Z7984 Long term (current) use of oral hypoglycemic drugs: Secondary | ICD-10-CM | POA: Diagnosis not present

## 2015-02-25 DIAGNOSIS — I69351 Hemiplegia and hemiparesis following cerebral infarction affecting right dominant side: Secondary | ICD-10-CM | POA: Diagnosis not present

## 2015-02-25 DIAGNOSIS — I129 Hypertensive chronic kidney disease with stage 1 through stage 4 chronic kidney disease, or unspecified chronic kidney disease: Secondary | ICD-10-CM | POA: Diagnosis not present

## 2015-02-25 DIAGNOSIS — E1122 Type 2 diabetes mellitus with diabetic chronic kidney disease: Secondary | ICD-10-CM | POA: Diagnosis not present

## 2015-02-25 NOTE — Telephone Encounter (Signed)
[name] from [facility] called stating pts bp is  184/90 and her blood sugar is over 300. He is going to hold off the physical therapy because of the bp [name] can be reached at [phone].  Also, I read her the note [name] left the other day and she wasn't aware to take the metformin. She does state that [name] told her to take 1 a day and the prescription states 2 times a day. She also states she is taking minoxidil (LONITEN) 2.5 MG tablet [id number]. She says she took her last pill today but there is a refill for her at the pharmacy waiting for her. Is she suppose to take this and the losartan?  Can you please call her at [phone].

## 2015-02-26 NOTE — Telephone Encounter (Signed)
Please advise. Is pt supposed to take 1 or 2 metformin daily? Does she take minoxidil and losartan?

## 2015-02-26 NOTE — Telephone Encounter (Signed)
She needs to take both the losartan and minoxidil. Yes, she should be taking Metformin 2 times per day with her elevated blood sugars.

## 2015-02-27 NOTE — Telephone Encounter (Signed)
Pt is aware of how to take medications. Made a 1 month follow up.

## 2015-02-27 NOTE — Telephone Encounter (Signed)
Pt aware.

## 2015-03-02 DIAGNOSIS — E1151 Type 2 diabetes mellitus with diabetic peripheral angiopathy without gangrene: Secondary | ICD-10-CM | POA: Diagnosis not present

## 2015-03-02 DIAGNOSIS — D649 Anemia, unspecified: Secondary | ICD-10-CM | POA: Diagnosis not present

## 2015-03-02 DIAGNOSIS — Z7982 Long term (current) use of aspirin: Secondary | ICD-10-CM | POA: Diagnosis not present

## 2015-03-02 DIAGNOSIS — Z720 Tobacco use: Secondary | ICD-10-CM | POA: Diagnosis not present

## 2015-03-02 DIAGNOSIS — Z7984 Long term (current) use of oral hypoglycemic drugs: Secondary | ICD-10-CM | POA: Diagnosis not present

## 2015-03-02 DIAGNOSIS — E785 Hyperlipidemia, unspecified: Secondary | ICD-10-CM | POA: Diagnosis not present

## 2015-03-02 DIAGNOSIS — I69351 Hemiplegia and hemiparesis following cerebral infarction affecting right dominant side: Secondary | ICD-10-CM | POA: Diagnosis not present

## 2015-03-02 DIAGNOSIS — E1122 Type 2 diabetes mellitus with diabetic chronic kidney disease: Secondary | ICD-10-CM | POA: Diagnosis not present

## 2015-03-02 DIAGNOSIS — N183 Chronic kidney disease, stage 3 (moderate): Secondary | ICD-10-CM | POA: Diagnosis not present

## 2015-03-02 DIAGNOSIS — I129 Hypertensive chronic kidney disease with stage 1 through stage 4 chronic kidney disease, or unspecified chronic kidney disease: Secondary | ICD-10-CM | POA: Diagnosis not present

## 2015-03-04 ENCOUNTER — Telehealth: Payer: Self-pay | Admitting: Family

## 2015-03-04 DIAGNOSIS — I129 Hypertensive chronic kidney disease with stage 1 through stage 4 chronic kidney disease, or unspecified chronic kidney disease: Secondary | ICD-10-CM | POA: Diagnosis not present

## 2015-03-04 DIAGNOSIS — E785 Hyperlipidemia, unspecified: Secondary | ICD-10-CM | POA: Diagnosis not present

## 2015-03-04 DIAGNOSIS — E1151 Type 2 diabetes mellitus with diabetic peripheral angiopathy without gangrene: Secondary | ICD-10-CM | POA: Diagnosis not present

## 2015-03-04 DIAGNOSIS — E1122 Type 2 diabetes mellitus with diabetic chronic kidney disease: Secondary | ICD-10-CM | POA: Diagnosis not present

## 2015-03-04 DIAGNOSIS — Z7982 Long term (current) use of aspirin: Secondary | ICD-10-CM | POA: Diagnosis not present

## 2015-03-04 DIAGNOSIS — Z7984 Long term (current) use of oral hypoglycemic drugs: Secondary | ICD-10-CM | POA: Diagnosis not present

## 2015-03-04 DIAGNOSIS — D649 Anemia, unspecified: Secondary | ICD-10-CM | POA: Diagnosis not present

## 2015-03-04 DIAGNOSIS — Z720 Tobacco use: Secondary | ICD-10-CM | POA: Diagnosis not present

## 2015-03-04 DIAGNOSIS — I69351 Hemiplegia and hemiparesis following cerebral infarction affecting right dominant side: Secondary | ICD-10-CM | POA: Diagnosis not present

## 2015-03-04 DIAGNOSIS — N183 Chronic kidney disease, stage 3 (moderate): Secondary | ICD-10-CM | POA: Diagnosis not present

## 2015-03-04 DIAGNOSIS — I10 Essential (primary) hypertension: Secondary | ICD-10-CM

## 2015-03-04 NOTE — Telephone Encounter (Signed)
[name] is calling to ask a question about the patients elevated bop. States that he would like to get clarification on how high he should allow it to go an still work with her. An example would be 200/94 which was read today.

## 2015-03-04 NOTE — Telephone Encounter (Signed)
Please advise 

## 2015-03-05 NOTE — Telephone Encounter (Signed)
Limit of blood pressure would be systolic of [id number]. I am forwarding her to cardiology for further assessment of resistant hypertension. Please encourage her to limit the salt in her diet and avoid fried/processed foods.

## 2015-03-06 NOTE — Telephone Encounter (Signed)
Left detailed message.   

## 2015-03-06 NOTE — Telephone Encounter (Signed)
LVM for [name] to call back.

## 2015-03-07 ENCOUNTER — Encounter (HOSPITAL_COMMUNITY): Payer: Self-pay

## 2015-03-07 ENCOUNTER — Emergency Department (HOSPITAL_COMMUNITY)
Admission: EM | Admit: 2015-03-07 | Discharge: 2015-03-08 | Disposition: A | Discharge status: Home / Self Care | Payer: Medicare Other | Attending: Emergency Medicine | Admitting: Emergency Medicine

## 2015-03-07 DIAGNOSIS — Y9389 Activity, other specified: Secondary | ICD-10-CM | POA: Diagnosis not present

## 2015-03-07 DIAGNOSIS — S79911A Unspecified injury of right hip, initial encounter: Secondary | ICD-10-CM | POA: Diagnosis present

## 2015-03-07 DIAGNOSIS — Z7982 Long term (current) use of aspirin: Secondary | ICD-10-CM | POA: Insufficient documentation

## 2015-03-07 DIAGNOSIS — M25551 Pain in right hip: Secondary | ICD-10-CM | POA: Diagnosis not present

## 2015-03-07 DIAGNOSIS — Z8739 Personal history of other diseases of the musculoskeletal system and connective tissue: Secondary | ICD-10-CM | POA: Insufficient documentation

## 2015-03-07 DIAGNOSIS — E119 Type 2 diabetes mellitus without complications: Secondary | ICD-10-CM | POA: Insufficient documentation

## 2015-03-07 DIAGNOSIS — F17210 Nicotine dependence, cigarettes, uncomplicated: Secondary | ICD-10-CM | POA: Insufficient documentation

## 2015-03-07 DIAGNOSIS — G8929 Other chronic pain: Secondary | ICD-10-CM | POA: Insufficient documentation

## 2015-03-07 DIAGNOSIS — I10 Essential (primary) hypertension: Secondary | ICD-10-CM | POA: Insufficient documentation

## 2015-03-07 DIAGNOSIS — Y998 Other external cause status: Secondary | ICD-10-CM | POA: Diagnosis not present

## 2015-03-07 DIAGNOSIS — Z872 Personal history of diseases of the skin and subcutaneous tissue: Secondary | ICD-10-CM | POA: Diagnosis not present

## 2015-03-07 DIAGNOSIS — Z79899 Other long term (current) drug therapy: Secondary | ICD-10-CM | POA: Insufficient documentation

## 2015-03-07 DIAGNOSIS — S7001XA Contusion of right hip, initial encounter: Secondary | ICD-10-CM | POA: Insufficient documentation

## 2015-03-07 DIAGNOSIS — Z8673 Personal history of transient ischemic attack (TIA), and cerebral infarction without residual deficits: Secondary | ICD-10-CM | POA: Insufficient documentation

## 2015-03-07 DIAGNOSIS — E785 Hyperlipidemia, unspecified: Secondary | ICD-10-CM | POA: Insufficient documentation

## 2015-03-07 DIAGNOSIS — Z8719 Personal history of other diseases of the digestive system: Secondary | ICD-10-CM | POA: Diagnosis not present

## 2015-03-07 DIAGNOSIS — W208XXA Other cause of strike by thrown, projected or falling object, initial encounter: Secondary | ICD-10-CM | POA: Diagnosis not present

## 2015-03-07 DIAGNOSIS — Z7984 Long term (current) use of oral hypoglycemic drugs: Secondary | ICD-10-CM | POA: Diagnosis not present

## 2015-03-07 DIAGNOSIS — Y92512 Supermarket, store or market as the place of occurrence of the external cause: Secondary | ICD-10-CM | POA: Insufficient documentation

## 2015-03-07 MED ORDER — CLONIDINE HCL 0.2 MG PO TABS
0.2000 mg | ORAL_TABLET | Freq: Once | ORAL | Status: AC
  Administered 2015-03-08: 0.2 mg via ORAL
  Filled 2015-03-07: qty 1

## 2015-03-07 NOTE — ED Notes (Signed)
Robin EMT made this RN aware of BP. Ken EMT to get manual BP.

## 2015-03-07 NOTE — ED Notes (Signed)
Pt here for right hip pain after some canned sodas fell against her earlier today when in a store.

## 2015-03-07 NOTE — ED Provider Notes (Signed)
CSN: [id number]     Arrival date & time 03/07/15  2008 History   By signing my name below, I, Randa Evens, attest that this documentation has been prepared under the direction and in the presence of Sharlett Iles, MD. Electronically Signed: Randa Evens, ED Scribe. 03/08/2015. 12:07 AM.      Chief Complaint  Patient presents with  . Hip Pain   The history is provided by the patient. No language interpreter was used.   HPI Comments: [name] is a 65 y.o. female who presents to the Emergency Department complaining of new right hip pain onset today. Pt states that today she was at the store when a box of soda cans fell hitting her right hip. Pt states that she has been ambulatory with a cane. She denies fall or head injury. Pt reports Hx of stroke with right sided deficits. Pt presents with elevated BP, but states that she has not taking her last dose of clonidine and metformin today.   Past Medical History  Diagnosis Date  . Hypertension   . History of sebaceous cyst   . Tobacco abuse   . Cough   . Hyperthyroidism   . Hyperlipemia   . Diabetes mellitus, type 2 ([place])   . Chronic back pain   . Lumbar radiculopathy   . Shortness of breath 09/13/2013  . GERD (gastroesophageal reflux disease)   . Stroke [facility])    Past Surgical History  Procedure Laterality Date  . Retinopathy surgery  2013    Dr. Zigmund Daniel  . Abdominal hysterectomy     Family History  Problem Relation Age of Onset  . Hypertension Mother   . Sleep apnea Mother   . Cancer Father     lung cancer - smoker  . Hypertension Sister   . Hypertension Brother   . Hypertension Sister   . Diabetes Brother    Social History  Substance Use Topics  . Smoking status: Current Every Day Smoker -- 0.50 packs/day for 35 years    Types: Cigarettes  . Smokeless tobacco: Never Used     Comment: patient advised to keep diary, develop strategy and stop  . Alcohol Use: Yes     Comment: rare   OB History    No  data available     Review of Systems 10 Systems reviewed and all are negative for acute change except as noted in the HPI.    Allergies  Review of patient's allergies indicates no known allergies.  Home Medications   Prior to Admission medications   Medication Sig Start Date End Date Taking? Authorizing Provider  amLODipine-benazepril (LOTREL) 10-40 MG capsule Take 1 capsule by mouth daily. 03/01/15  Yes Historical Provider, MD  aspirin 325 MG tablet Take 325 mg by mouth daily.   Yes Historical Provider, MD  atorvastatin (LIPITOR) 80 MG tablet TAKE 1 TABLET BY MOUTH EVERY DAY(NEEDS PA) 02/05/15  Yes Golden Circle, FNP  cloNIDine (CATAPRES) 0.2 MG tablet Take 1 tablet (0.2 mg total) by mouth 3 (three) times daily. 01/15/15  Yes Golden Circle, FNP  losartan (COZAAR) 50 MG tablet Take 1 tablet (50 mg total) by mouth daily. 02/23/15  Yes Golden Circle, FNP  metFORMIN (GLUCOPHAGE-XR) 750 MG 24 hr tablet TAKE 1 TABLET (750 MG TOTAL) BY MOUTH 2 (TWO) TIMES DAILY. 02/23/15  Yes Golden Circle, FNP  metoprolol succinate (TOPROL-XL) 100 MG 24 hr tablet Take 1 tablet (100 mg total) by mouth daily. Take with or  immediately following a meal. 01/15/15  Yes Golden Circle, FNP  minoxidil (LONITEN) 2.5 MG tablet Take 2.5 mg by mouth daily.   Yes Historical Provider, MD  amLODipine (NORVASC) 10 MG tablet Take 1 tablet (10 mg total) by mouth daily. Patient not taking: Reported on 03/08/2015 01/15/15   Golden Circle, FNP  glucose blood (ONETOUCH VERIO) test strip Use as instructed 02/09/15   Golden Circle, FNP   BP 190/82 mmHg  Pulse 58  Temp(Src) 98.1 F (36.7 C) (Oral)  Resp 15  Ht 5\' 4"  (1.626 m)  Wt 136 lb (61.689 kg)  BMI 23.33 kg/m2  SpO2 100%   Physical Exam  Constitutional: She is oriented to person, place, and time. She appears well-developed and well-nourished. No distress.  HENT:  Head: Normocephalic and atraumatic.  Moist mucous membranes  Eyes: Conjunctivae are normal.   Neck: Neck supple.  Cardiovascular: Normal rate, regular rhythm and normal heart sounds.   No murmur heard. Pulmonary/Chest: Effort normal and breath sounds normal.  Abdominal: Soft. Bowel sounds are normal. She exhibits no distension. There is no tenderness.  Musculoskeletal: She exhibits tenderness. She exhibits no edema.  TTP of right lateral hip without obvious deformity or bruising. 5/5 strength BLE  Neurological: She is alert and oriented to person, place, and time.  Fluent speech  Skin: Skin is warm and dry.  Psychiatric: She has a normal mood and affect. Judgment normal.  Nursing note and vitals reviewed.   ED Course  Procedures (including critical care time) DIAGNOSTIC STUDIES: Oxygen Saturation is 100% on RA, normal by my interpretation.    COORDINATION OF CARE: 12:05 AM-Discussed treatment plan with pt at bedside and pt agreed to plan.     Labs Review Labs Reviewed - No data to display  Imaging Review Dg Hip Unilat With Pelvis 2-3 Views Right  03/08/2015  CLINICAL DATA:  65 year old female with right hip pain EXAM: DG HIP (WITH OR WITHOUT PELVIS) 2-3V RIGHT COMPARISON:  None. FINDINGS: There is no acute fracture or dislocation. There is mild osteopenia. There are mild osteoarthritic changes of the hip joints bilaterally. The degenerative changes of the lower lumbar spine. Stool noted throughout the colon. The soft tissues are grossly unremarkable. IMPRESSION: No acute fracture or dislocation. Electronically Signed   By: Anner Crete M.D.   On: 03/08/2015 00:45     EKG Interpretation None      Medications  cloNIDine (CATAPRES) tablet 0.2 mg (0.2 mg Oral Given 03/08/15 0010)  metFORMIN (GLUCOPHAGE) tablet 750 mg (750 mg Oral Given 03/08/15 0010)    MDM   Final diagnoses:  Contusion of right hip, initial encounter   Patient with right hip pain after being struck in the hip by a box of cans earlier today. She has been ambulatory since the injury. She was  hypertensive at 204/89 at presentation but had not taken her evening clonidine dose. Blood pressure improved after medication. No obvious deformity of hip and normal strength of bilateral lower extremities. Plain films negative for acute fracture. Instructed on supportive care as well as follow-up with PCP in one week if not improved. Instructed to continue home blood pressure medication regimen. Patient voiced understanding and was discharged in satisfactory condition.   I personally performed the services described in this documentation, which was scribed in my presence. The recorded information has been reviewed and is accurate.      Sharlett Iles, MD 03/08/15 [phone]

## 2015-03-08 ENCOUNTER — Emergency Department (HOSPITAL_COMMUNITY): Payer: Medicare Other

## 2015-03-08 DIAGNOSIS — M25551 Pain in right hip: Secondary | ICD-10-CM | POA: Diagnosis not present

## 2015-03-08 MED ORDER — METFORMIN HCL 500 MG PO TABS
750.0000 mg | ORAL_TABLET | Freq: Once | ORAL | Status: AC
  Administered 2015-03-08: 750 mg via ORAL
  Filled 2015-03-08: qty 2

## 2015-03-08 NOTE — ED Notes (Signed)
Pt back from x-ray.

## 2015-03-08 NOTE — Discharge Instructions (Signed)

## 2015-03-08 NOTE — ED Notes (Signed)
Pt to xray

## 2015-03-08 NOTE — ED Notes (Signed)
EDP at bedside  

## 2015-03-09 DIAGNOSIS — I69351 Hemiplegia and hemiparesis following cerebral infarction affecting right dominant side: Secondary | ICD-10-CM | POA: Diagnosis not present

## 2015-03-09 DIAGNOSIS — E785 Hyperlipidemia, unspecified: Secondary | ICD-10-CM | POA: Diagnosis not present

## 2015-03-09 DIAGNOSIS — N183 Chronic kidney disease, stage 3 (moderate): Secondary | ICD-10-CM | POA: Diagnosis not present

## 2015-03-09 DIAGNOSIS — D649 Anemia, unspecified: Secondary | ICD-10-CM | POA: Diagnosis not present

## 2015-03-09 DIAGNOSIS — Z720 Tobacco use: Secondary | ICD-10-CM | POA: Diagnosis not present

## 2015-03-09 DIAGNOSIS — Z7984 Long term (current) use of oral hypoglycemic drugs: Secondary | ICD-10-CM | POA: Diagnosis not present

## 2015-03-09 DIAGNOSIS — I129 Hypertensive chronic kidney disease with stage 1 through stage 4 chronic kidney disease, or unspecified chronic kidney disease: Secondary | ICD-10-CM | POA: Diagnosis not present

## 2015-03-09 DIAGNOSIS — E1122 Type 2 diabetes mellitus with diabetic chronic kidney disease: Secondary | ICD-10-CM | POA: Diagnosis not present

## 2015-03-09 DIAGNOSIS — E1151 Type 2 diabetes mellitus with diabetic peripheral angiopathy without gangrene: Secondary | ICD-10-CM | POA: Diagnosis not present

## 2015-03-09 DIAGNOSIS — Z7982 Long term (current) use of aspirin: Secondary | ICD-10-CM | POA: Diagnosis not present

## 2015-03-10 ENCOUNTER — Other Ambulatory Visit: Payer: Self-pay

## 2015-03-10 MED ORDER — METFORMIN HCL ER 750 MG PO TB24: ORAL_TABLET | ORAL | Status: DC

## 2015-03-10 MED ORDER — ATORVASTATIN CALCIUM 80 MG PO TABS: ORAL_TABLET | ORAL | Status: DC

## 2015-03-11 DIAGNOSIS — Z7982 Long term (current) use of aspirin: Secondary | ICD-10-CM | POA: Diagnosis not present

## 2015-03-11 DIAGNOSIS — D649 Anemia, unspecified: Secondary | ICD-10-CM | POA: Diagnosis not present

## 2015-03-11 DIAGNOSIS — E1151 Type 2 diabetes mellitus with diabetic peripheral angiopathy without gangrene: Secondary | ICD-10-CM | POA: Diagnosis not present

## 2015-03-11 DIAGNOSIS — E1122 Type 2 diabetes mellitus with diabetic chronic kidney disease: Secondary | ICD-10-CM | POA: Diagnosis not present

## 2015-03-11 DIAGNOSIS — E785 Hyperlipidemia, unspecified: Secondary | ICD-10-CM | POA: Diagnosis not present

## 2015-03-11 DIAGNOSIS — N183 Chronic kidney disease, stage 3 (moderate): Secondary | ICD-10-CM | POA: Diagnosis not present

## 2015-03-11 DIAGNOSIS — I129 Hypertensive chronic kidney disease with stage 1 through stage 4 chronic kidney disease, or unspecified chronic kidney disease: Secondary | ICD-10-CM | POA: Diagnosis not present

## 2015-03-11 DIAGNOSIS — Z7984 Long term (current) use of oral hypoglycemic drugs: Secondary | ICD-10-CM | POA: Diagnosis not present

## 2015-03-11 DIAGNOSIS — Z720 Tobacco use: Secondary | ICD-10-CM | POA: Diagnosis not present

## 2015-03-11 DIAGNOSIS — I69351 Hemiplegia and hemiparesis following cerebral infarction affecting right dominant side: Secondary | ICD-10-CM | POA: Diagnosis not present

## 2015-03-23 ENCOUNTER — Encounter: Payer: Self-pay | Admitting: Neurology

## 2015-03-23 ENCOUNTER — Ambulatory Visit (INDEPENDENT_AMBULATORY_CARE_PROVIDER_SITE_OTHER): Payer: Medicare Other | Admitting: Neurology

## 2015-03-23 VITALS — BP 152/79 | HR 54 | Ht 63.5 in | Wt 132.4 lb

## 2015-03-23 DIAGNOSIS — I639 Cerebral infarction, unspecified: Secondary | ICD-10-CM

## 2015-03-23 DIAGNOSIS — I6381 Other cerebral infarction due to occlusion or stenosis of small artery: Secondary | ICD-10-CM

## 2015-03-23 NOTE — Patient Instructions (Signed)
I had a long d/w patient about her recent stroke, risk for recurrent stroke/TIAs, personally independently reviewed imaging studies and stroke evaluation results and answered questions.Continue aspirin 325 mg daily  for secondary stroke prevention and maintain strict control of hypertension with blood pressure goal below 130/90, diabetes with hemoglobin A1c goal below 6.5% and lipids with LDL cholesterol goal below 70 mg/dL. I also advised the patient to eat a healthy diet with plenty of whole grains, cereals, fruits and vegetables, exercise regularly and maintain ideal body weight Followup in the future with stroke NP  in  6 months or call earlier if needed Stroke Prevention Some medical conditions and behaviors are associated with an increased chance of having a stroke. You may prevent a stroke by making healthy choices and managing medical conditions. HOW CAN I REDUCE MY RISK OF HAVING A STROKE?   Stay physically active. Get at least 30 minutes of activity on most or all days.  Do not smoke. It may also be helpful to avoid exposure to secondhand smoke.  Limit alcohol use. Moderate alcohol use is considered to be:  No more than 2 drinks per day for men.  No more than 1 drink per day for nonpregnant women.  Eat healthy foods. This involves:  Eating 5 or more servings of fruits and vegetables a day.  Making dietary changes that address high blood pressure (hypertension), high cholesterol, diabetes, or obesity.  Manage your cholesterol levels.  Making food choices that are high in fiber and low in saturated fat, trans fat, and cholesterol may control cholesterol levels.  Take any prescribed medicines to control cholesterol as directed by your health care provider.  Manage your diabetes.  Controlling your carbohydrate and sugar intake is recommended to manage diabetes.  Take any prescribed medicines to control diabetes as directed by your health care provider.  Control your  hypertension.  Making food choices that are low in salt (sodium), saturated fat, trans fat, and cholesterol is recommended to manage hypertension.  Ask your health care provider if you need treatment to lower your blood pressure. Take any prescribed medicines to control hypertension as directed by your health care provider.  If you are 1-89 years of age, have your blood pressure checked every 3-5 years. If you are 93 years of age or older, have your blood pressure checked every year.  Maintain a healthy weight.  Reducing calorie intake and making food choices that are low in sodium, saturated fat, trans fat, and cholesterol are recommended to manage weight.  Stop drug abuse.  Avoid taking birth control pills.  Talk to your health care provider about the risks of taking birth control pills if you are over 40 years old, smoke, get migraines, or have ever had a blood clot.  Get evaluated for sleep disorders (sleep apnea).  Talk to your health care provider about getting a sleep evaluation if you snore a lot or have excessive sleepiness.  Take medicines only as directed by your health care provider.  For some people, aspirin or blood thinners (anticoagulants) are helpful in reducing the risk of forming abnormal blood clots that can lead to stroke. If you have the irregular heart rhythm of atrial fibrillation, you should be on a blood thinner unless there is a good reason you cannot take them.  Understand all your medicine instructions.  Make sure that other conditions (such as anemia or atherosclerosis) are addressed. SEEK IMMEDIATE MEDICAL CARE IF:   You have sudden weakness or numbness of the  face, arm, or leg, especially on one side of the body.  Your face or eyelid droops to one side.  You have sudden confusion.  You have trouble speaking (aphasia) or understanding.  You have sudden trouble seeing in one or both eyes.  You have sudden trouble walking.  You have  dizziness.  You have a loss of balance or coordination.  You have a sudden, severe headache with no known cause.  You have new chest pain or an irregular heartbeat. Any of these symptoms may represent a serious problem that is an emergency. Do not wait to see if the symptoms will go away. Get medical help at once. Call your local emergency services (911 in [place] Do not drive yourself to the hospital.   This information is not intended to replace advice given to you by your health care provider. Make sure you discuss any questions you have with your health care provider.   Document Released: 02/18/2004 Document Revised: 01/31/2014 Document Reviewed: 07/13/2012 Elsevier Interactive Patient Education [employer].

## 2015-03-23 NOTE — Progress Notes (Signed)
[facility] [address] [place]. [place] [address] [phone]       OFFICE FOLLOW-UP NOTE  Ms. ROSALIND KOLLIN Date of Birth:  10/21/1950 Medical Record Number:  [id number]   HPI: 26 year lady being seen today for first office follow-up visit for hospital admission for stroke in December 2016. KRYSTYNE DOWIE is a 65 y.o. 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 She stated that she ran out of her medications 3-4 days ago, and before yesterday she woke up " staggering and with poor body control" and subsequently noticed a numb/tingly sensation in her right foot. Denies associated weakness of the right leg, no numbness-tingling-weakness right arm. Further, no HA, vertigo, double vision, slurred speech, language or vision impairment. Mrs. Bontemps said that the right foot numbness is going away but now is " more noticeable in the left foot". MRI brain was personally reviewed and showed a small acute infarct involving the left lateral geniculate body. Date last known well: 01/06/15 Time last known well: unable to deteermine tPA Given: no, late presentation     CT scan of the head on admission showed no acute abnormality but MRI scan confirmed an acute infarct in the left lateral geniculate body with moderate chronic changes of small vessel disease. MRA of the brain showed no large vessel occlusion. Carotid ultrasound showed no significant extracranial stenosis. Transthoracic echo showed normal ejection fraction without cardiac source of embolism. LDL cholesterol was elevated and 90 mg percent and hemoglobin A1c at 11.2. Patient's home dose of Lipitor was increased to 80 mg daily and she was started on aspirin 325 daily. She states she's done well since discharge. She still has some  diminished fine motor skills in the left hand and drags her left foot while walking and occasionally staggers when she moves fast. She still has some residual numbness on the right side which has not improved. She states her blood pressure is well controlled at home but does fluctuate occasionally. It is elevated today at 152/79. She is seeing her primary care physician to adjust her blood pressure medications. The patient has quit smoking in June 2016. She has finished home physical and occupational therapy few weeks ago. ROS:   14 system review of systems is positive for weight loss, leg swelling, blurred vision, feeling hot and cold, runny nose, headache, numbness, not enough sleep and all other systems negative  PMH:  Past Medical History  Diagnosis Date  . Hypertension   . History of sebaceous cyst   . Tobacco abuse   . Cough   . Hyperthyroidism   . Hyperlipemia   . Diabetes mellitus, type 2 ([place])   . Chronic back pain   . Lumbar radiculopathy   . Shortness of breath 09/13/2013  . GERD (gastroesophageal reflux disease)   . Stroke [facility])     Social History:  Social History   Social History  . Marital Status: Divorced    Spouse Name: N/A  . Number of Children: 1  . Years of Education: 11   Occupational History  . weaver    Social History Main Topics  . Smoking status: Former Smoker -- 0.50 packs/day for 35 years    Types: Cigarettes    Quit date: 07/07/2014  . Smokeless tobacco: Never Used     Comment: patient advised  to keep diary, develop strategy and stop  . Alcohol Use: No     Comment: rare  . Drug Use: No  . Sexual Activity:    Partners: Male    Birth Control/ Protection: Sponge   Other Topics Concern  . Not on file   Social History Narrative   11th Grade. Married '84- 13 years/ divorce. 1 son- '68. Sexually active-using condoms. 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 Regular Exercise- no    Medications:   Current Outpatient Prescriptions on File Prior to  Visit  Medication Sig Dispense Refill  . amLODipine (NORVASC) 10 MG tablet Take 1 tablet (10 mg total) by mouth daily. 30 tablet 0  . amLODipine-benazepril (LOTREL) 10-40 MG capsule Take 1 capsule by mouth daily.  5  . aspirin 325 MG tablet Take 325 mg by mouth daily.    [name] atorvastatin (LIPITOR) 80 MG tablet TAKE 1 TABLET BY MOUTH EVERY DAY(NEEDS PA) 90 tablet 0  . cloNIDine (CATAPRES) 0.2 MG tablet Take 1 tablet (0.2 mg total) by mouth 3 (three) times daily. 90 tablet 0  . glucose blood (ONETOUCH VERIO) test strip Use as instructed 100 each 12  . losartan (COZAAR) 50 MG tablet Take 1 tablet (50 mg total) by mouth daily. 90 tablet 3  . metFORMIN (GLUCOPHAGE-XR) 750 MG 24 hr tablet TAKE 1 TABLET (750 MG TOTAL) BY MOUTH 2 (TWO) TIMES DAILY. 180 tablet 0  . metoprolol succinate (TOPROL-XL) 100 MG 24 hr tablet Take 1 tablet (100 mg total) by mouth daily. Take with or immediately following a meal. 30 tablet 1  . minoxidil (LONITEN) 2.5 MG tablet Take 2.5 mg by mouth daily.     No current facility-administered medications on file prior to visit.    Allergies:  No Known Allergies  Physical Exam General: well developed, well nourished Middle-aged lady, seated, in no evident distress Head: head normocephalic and atraumatic.  Neck: supple with no carotid or supraclavicular bruits Cardiovascular: regular rate and rhythm, no murmurs Musculoskeletal: no deformity Skin:  no rash/petichiae Vascular:  Normal pulses all extremities Filed Vitals:   03/23/15 1313  BP: 152/79  Pulse: 54   Neurologic Exam Mental Status: Awake and fully alert. Oriented to place and time. Recent and remote memory intact. Attention span, concentration and fund of knowledge appropriate. Mood and affect appropriate.  Cranial Nerves: Fundoscopic exam reveals sharp disc margins. Pupils equal, briskly reactive to light. Extraocular movements full without nystagmus. Visual fields full to confrontation. Hearing intact. Facial  sensation intact. Face, tongue, palate moves normally and symmetrically.  Motor: Normal bulk and tone. Normal strength in all tested extremity muscles.Diminished fine finger movements on the right. Orbits left over right upper extremity. Sensory.: Diminished touch ,pinprick .position and vibratory sensation. On the right side and normal on the left  Coordination: Rapid alternating movements normal in all extremities. Finger-to-nose and heel-to-shin performed accurately bilaterally. Gait and Station: Arises from chair without difficulty. Stance is normal. Gait demonstrates normal stride length and balance . Able to heel, toe and tandem walk without difficulty.  Reflexes: 1+ and symmetric. Toes downgoing.   NIHSS  1 Modified Rankin  1   ASSESSMENT: 89 year With left internal capsule infarct secondary to small vessel disease in December 2016 with vascular risk factors of hypertension, hyperlipidemia, diabetes,   and smoking   PLAN: I had a long d/w patient about her recent stroke, risk for recurrent stroke/TIAs, personally independently reviewed imaging studies and stroke evaluation results and answered questions.Continue aspirin 325  mg daily  for secondary stroke prevention and maintain strict control of hypertension with blood pressure goal below 130/90, diabetes with hemoglobin A1c goal below 6.5% and lipids with LDL cholesterol goal below 70 mg/dL. I also advised the patient to eat a healthy diet with plenty of whole grains, cereals, fruits and vegetables, exercise regularly and maintain ideal body weight Greater than 50% of time during this 25 minute visit was spent on counseling,explanation of diagnosis, planning of further management, discussion with patient and family and coordination of care .Followup in the future with stroke NP  in  6 months or call earlier if needed   Antony Contras, M.D. Note: This document was prepared with digital dictation and possible smart phrase technology. Any  transcriptional errors that result from this process are unintentional

## 2015-03-26 ENCOUNTER — Ambulatory Visit (INDEPENDENT_AMBULATORY_CARE_PROVIDER_SITE_OTHER): Payer: Medicare Other | Admitting: Internal Medicine

## 2015-03-26 ENCOUNTER — Ambulatory Visit (INDEPENDENT_AMBULATORY_CARE_PROVIDER_SITE_OTHER): Payer: Medicare Other | Admitting: Family

## 2015-03-26 ENCOUNTER — Encounter: Payer: Self-pay | Admitting: Internal Medicine

## 2015-03-26 ENCOUNTER — Encounter: Payer: Self-pay | Admitting: Family

## 2015-03-26 VITALS — BP 138/78 | HR 66 | Ht 64.0 in | Wt 136.9 lb

## 2015-03-26 VITALS — BP 152/86 | HR 68 | Temp 97.8°F | Resp 16 | Ht 63.5 in | Wt 135.0 lb

## 2015-03-26 DIAGNOSIS — E1351 Other specified diabetes mellitus with diabetic peripheral angiopathy without gangrene: Secondary | ICD-10-CM

## 2015-03-26 DIAGNOSIS — R011 Cardiac murmur, unspecified: Secondary | ICD-10-CM | POA: Diagnosis not present

## 2015-03-26 DIAGNOSIS — I639 Cerebral infarction, unspecified: Secondary | ICD-10-CM

## 2015-03-26 DIAGNOSIS — I10 Essential (primary) hypertension: Secondary | ICD-10-CM | POA: Diagnosis not present

## 2015-03-26 DIAGNOSIS — Z1231 Encounter for screening mammogram for malignant neoplasm of breast: Secondary | ICD-10-CM | POA: Diagnosis not present

## 2015-03-26 DIAGNOSIS — I159 Secondary hypertension, unspecified: Secondary | ICD-10-CM

## 2015-03-26 DIAGNOSIS — F17200 Nicotine dependence, unspecified, uncomplicated: Secondary | ICD-10-CM | POA: Diagnosis not present

## 2015-03-26 MED ORDER — LOSARTAN POTASSIUM-HCTZ 100-25 MG PO TABS: 1.0000 | ORAL_TABLET | Freq: Every day | ORAL | Status: DC

## 2015-03-26 MED ORDER — AMLODIPINE BESYLATE 10 MG PO TABS: 10.0000 mg | ORAL_TABLET | Freq: Every day | ORAL | Status: DC

## 2015-03-26 NOTE — Patient Instructions (Signed)
Thank you for choosing [facility].  Summary/Instructions:  Your prescription(s) have been submitted to your pharmacy or been printed and provided for you. Please take as directed and contact our office if you believe you are having problem(s) with the medication(s) or have any questions.  If your symptoms worsen or fail to improve, please contact our office for further instruction, or in case of emergency go directly to the emergency room at the closest medical facility.   Please continue to take the minoxidil, metoprolol and clonidine. STOP taking LOTENSIN.  START taking both the LOSARTAN-HYDROCHLOROTHIAZIDE and AMLODIPINE.

## 2015-03-26 NOTE — Progress Notes (Signed)
Subjective:    Patient ID: [name], female    DOB: 1950-02-26, 65 y.o.   MRN: [id number]  Chief Complaint  Patient presents with  . Follow-up    Hypertension, wants to set up mammogram    HPI:  [name] is a 65 y.o. female who  has a past medical history of Hypertension; History of sebaceous cyst; Tobacco abuse; Cough; Hyperthyroidism; Hyperlipemia; Diabetes mellitus, type 2 ([place]); Chronic back pain; Lumbar radiculopathy; Shortness of breath (09/13/2013); GERD (gastroesophageal reflux disease); and Stroke ([place]). and presents today for a follow up.  Hypertension - Currently prescribed metoprolol, minoxidil, losartan, clonidine, and amlodipine-benazepril. Reports taking the medication as prescribed. No adverse side effects or hypotensive readings. Does have occasionally blurred vision with no other symptoms of end organ damage.   BP Readings from Last 3 Encounters:  03/26/15 152/86  03/23/15 152/79  03/08/15 190/82    No Known Allergies   Current Outpatient Prescriptions on File Prior to Visit  Medication Sig Dispense Refill  . aspirin 325 MG tablet Take 325 mg by mouth daily.    [name] atorvastatin (LIPITOR) 80 MG tablet TAKE 1 TABLET BY MOUTH EVERY DAY(NEEDS PA) 90 tablet 0  . cloNIDine (CATAPRES) 0.2 MG tablet Take 1 tablet (0.2 mg total) by mouth 3 (three) times daily. 90 tablet 0  . glucose blood (ONETOUCH VERIO) test strip Use as instructed 100 each 12  . metFORMIN (GLUCOPHAGE-XR) 750 MG 24 hr tablet TAKE 1 TABLET (750 MG TOTAL) BY MOUTH 2 (TWO) TIMES DAILY. 180 tablet 0  . metoprolol succinate (TOPROL-XL) 100 MG 24 hr tablet Take 1 tablet (100 mg total) by mouth daily. Take with or immediately following a meal. 30 tablet 1  . minoxidil (LONITEN) 2.5 MG tablet Take 2.5 mg by mouth daily.     No current facility-administered medications on file prior to visit.    Review of Systems  Constitutional: Negative for fever and chills.  Respiratory: Negative for chest  tightness and shortness of breath.   Cardiovascular: Negative for chest pain, palpitations and leg swelling.  Neurological: Negative for headaches.      Objective:    BP 152/86 mmHg  Pulse 68  Temp(Src) 97.8 F (36.6 C) (Oral)  Resp 16  Ht 5' 3.5" (1.613 m)  Wt 135 lb (61.236 kg)  BMI 23.54 kg/m2  SpO2 98% Nursing note and vital signs reviewed.  Physical Exam  Constitutional: She is oriented to person, place, and time. She appears well-developed and well-nourished. No distress.  Cardiovascular: Normal rate, regular rhythm and intact distal pulses.   Murmur heard. Pulmonary/Chest: Effort normal and breath sounds normal.  Neurological: She is alert and oriented to person, place, and time.  Skin: Skin is warm and dry.  Psychiatric: She has a normal mood and affect. Her behavior is normal. Judgment and thought content normal.       Assessment & Plan:   Problem List Items Addressed This Visit      Cardiovascular and Mediastinum   Essential hypertension - Primary    Blood pressure remains above goal of 140/90 with current regimen. Reports taking the medication as prescribed. No adverse side effects noted. Continue current dosage of minoxidil, metoprolol, and clonidine. Discontinue Lotensin. Start amlodipine and losartan-hydrochlorothiazide. Continue to monitor blood pressure at home. Follow-up with cardiology as scheduled. Follow-up office visit in one month to determine effectiveness of blood pressure control.      Relevant Medications   losartan-hydrochlorothiazide (HYZAAR) 100-25 MG tablet  amLODipine (NORVASC) 10 MG tablet    Other Visit Diagnoses    Encounter for screening mammogram for breast cancer        Relevant Orders    MM Digital Screening

## 2015-03-26 NOTE — Assessment & Plan Note (Signed)
Blood pressure remains above goal of 140/90 with current regimen. Reports taking the medication as prescribed. No adverse side effects noted. Continue current dosage of minoxidil, metoprolol, and clonidine. Discontinue Lotensin. Start amlodipine and losartan-hydrochlorothiazide. Continue to monitor blood pressure at home. Follow-up with cardiology as scheduled. Follow-up office visit in one month to determine effectiveness of blood pressure control.

## 2015-03-26 NOTE — Progress Notes (Signed)
OFFICE NOTE  Chief Complaint:  Malignant hypertension  Primary Care Physician: [name], FNP  HPI:  [name]  Is a pleasant 65 year old female who is kindly referred to me by Terri Piedra, FN-P,  For evaluation of malignant hypertension. [name] has a long-standing history of hypertension. She tells me in the past she was followed by Dr. Velora Heckler for her cardiac issues.  She has not been seen recently regarding her malignant hypertension. She does see Dr. Jamal Maes in nephrology for her hypertension. This is who placed her on minoxidil 2.5 mg daily. In the past she's had significant chronic kidney disease although recently her creatinine has improved significantly for unknown reasons. She does have difficult to control hypertension. She  Is on Norvasc, high-dose clonidine, metoprolol, and minoxidil. Blood pressure has been difficult to control and after just seeing Mr. Elna Breslow recently he started her on losartan HCTZ.  She has a history of 3 prior strokes, all thought to be related to hypertension. There is a strong family history of hypertension as well. She does not necessarily have a history of hypokalemia. 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 She does have a history of murmur in the past  With mitral regurgitation, but her recent echo in December 2016 only showed trivial mitral regurgitation with vigorous LV function and EF 65-70%.  She denies any chest pain or worsening shortness of breath.  PMHx:  Past Medical History  Diagnosis Date  . Hypertension   . History of sebaceous cyst   . Tobacco abuse   . Cough   . Hyperthyroidism   . Hyperlipemia   . Diabetes mellitus, type 2 ([place])   . Chronic back pain   . Lumbar radiculopathy   . Shortness of breath 09/13/2013  . GERD (gastroesophageal reflux disease)   . Stroke [facility])     Past Surgical History  Procedure Laterality Date  . Retinopathy surgery  2013   Dr. Zigmund Daniel  . Abdominal hysterectomy      FAMHx:  Family History  Problem Relation Age of Onset  . Hypertension Mother   . Sleep apnea Mother   . Cancer Father     lung cancer - smoker  . Hypertension Sister   . Hypertension Brother   . Hypertension Sister   . Diabetes Brother     SOCHx:   reports that she quit smoking about 8 months ago. Her smoking use included Cigarettes. She has a 17.5 pack-year smoking history. She has never used smokeless tobacco. She reports that she does not drink alcohol or use illicit drugs.  ALLERGIES:  No Known Allergies  ROS: Pertinent items noted in HPI and remainder of comprehensive ROS otherwise negative.  HOME MEDS: Current Outpatient Prescriptions  Medication Sig Dispense Refill  . amLODipine (NORVASC) 10 MG tablet Take 1 tablet (10 mg total) by mouth daily. 90 tablet 3  . aspirin 325 MG tablet Take 325 mg by mouth daily.    [name] atorvastatin (LIPITOR) 80 MG tablet TAKE 1 TABLET BY MOUTH EVERY DAY(NEEDS PA) 90 tablet 0  . cloNIDine (CATAPRES) 0.2 MG tablet Take 1 tablet (0.2 mg total) by mouth 3 (three) times daily. 90 tablet 0  . glucose blood (ONETOUCH VERIO) test strip Use as instructed 100 each 12  . losartan-hydrochlorothiazide (HYZAAR) 100-25 MG tablet Take 1 tablet by mouth daily. 30 tablet 1  . metFORMIN (GLUCOPHAGE-XR) 750 MG 24 hr tablet TAKE 1 TABLET (750 MG TOTAL)  BY MOUTH 2 (TWO) TIMES DAILY. 180 tablet 0  . metoprolol succinate (TOPROL-XL) 100 MG 24 hr tablet Take 1 tablet (100 mg total) by mouth daily. Take with or immediately following a meal. 30 tablet 1  . minoxidil (LONITEN) 2.5 MG tablet Take 2.5 mg by mouth daily.     No current facility-administered medications for this visit.    LABS/IMAGING: No results found for this or any previous visit (from the past 48 hour(s)). No results found.  WEIGHTS: Wt Readings from Last 3 Encounters:  03/26/15 136 lb 14.4 oz (62.097 kg)  03/26/15 135 lb (61.236 kg)  03/23/15 132 lb  6.4 oz (60.056 kg)    VITALS: BP 138/78 mmHg  Pulse 66  Ht 5\' 4"  (1.626 m)  Wt 136 lb 14.4 oz (62.097 kg)  BMI 23.49 kg/m2  EXAM: General appearance: alert and no distress Neck: no carotid bruit and no JVD Lungs: clear to auscultation bilaterally Heart: regular rate and rhythm, S1, S2 normal, no murmur, click, rub or gallop Abdomen: soft, non-tender; bowel sounds normal; no masses,  no organomegaly Extremities: extremities normal, atraumatic, no cyanosis or edema Pulses: 2+ and symmetric Skin: Skin color, texture, turgor normal. No rashes or lesions Neurologic: Grossly normal Psych: Pleasant  EKG: NSR at 67, LAFB  ASSESSMENT: 1. Malignant hypertension 2. Strokes secondary to #1 3. Dyslipidemia 4. Murmur 5. DM2 6. CKD2  PLAN: 1.   [name] has significant malignant hypertension which is been difficult to control. Recently she had improvement in her LV function and her primary care provider wanted to start her on losartan HCTZ. I think this is reasonable although her renal function will have to be watched closely especially since she's had significant kidney dysfunction in the past. [name] tells me this was secondary probably to date dehydration and she was taken off of Lasix in the past. I would coordinate her blood pressure management with her nephrologist who placed her on minoxidil. Given the multiple medications she is required for blood pressure control, there is a small chance that she could have secondary hypertension. I recommend renal artery Dopplers as well as a renin aldosterone ratio to evaluate for secondary causes of hypertension. If these are negative, we may still be dealing with essential hypertension and would try to maximize her current medications.  Plan to see her back for recheck of blood pressure and to discuss results of her studies in a few weeks. Thanks for the kind referral.  Pixie Casino, MD, [facility] Attending Cardiologist [place] 03/26/2015, 5:52 PM

## 2015-03-26 NOTE — Patient Instructions (Addendum)
Medication Instructions:  OK to start Hyzaar as prescribed by you primary care physician  Labwork: Renin Aldosterone  Testing/Procedures: Your physician has requested that you have a renal artery duplex. During this test, an ultrasound is used to evaluate blood flow to the kidneys. Allow one hour for this exam. Do not eat after midnight the day before and avoid carbonated beverages. Take your medications as you usually do.    Follow-Up: Your physician recommends that you schedule a follow-up appointment in: 2-4 weeks   Any Other Special Instructions Will Be Listed Below (If Applicable). Your physician has requested that you regularly monitor and record your blood pressure readings at home. Please use the same machine at the same time of day to check your readings and record them to bring to your follow-up visit.       If you need a refill on your cardiac medications before your next appointment, please call your pharmacy.

## 2015-03-26 NOTE — Progress Notes (Signed)
Pre visit review using our clinic review tool, if applicable. No additional management support is needed unless otherwise documented below in the visit note. 

## 2015-03-27 ENCOUNTER — Encounter: Payer: Self-pay | Admitting: *Deleted

## 2015-03-31 LAB — ALDOSTERONE + RENIN ACTIVITY W/ RATIO
ALDO / PRA RATIO: 30.8 ratio — AB (ref 0.9–28.9)
Aldosterone: 4 ng/dL
PRA LC/MS/MS: 0.13 ng/mL/h — ABNORMAL LOW (ref 0.25–5.82)

## 2015-04-01 ENCOUNTER — Encounter (HOSPITAL_COMMUNITY): Payer: Medicare Other

## 2015-04-01 ENCOUNTER — Ambulatory Visit (HOSPITAL_COMMUNITY)
Admission: RE | Admit: 2015-04-01 | Discharge: 2015-04-01 | Disposition: A | Discharge status: Home / Self Care | Payer: Medicare Other | Source: Ambulatory Visit | Attending: Cardiovascular Disease | Admitting: Cardiovascular Disease

## 2015-04-01 DIAGNOSIS — I159 Secondary hypertension, unspecified: Secondary | ICD-10-CM | POA: Diagnosis not present

## 2015-04-01 DIAGNOSIS — E785 Hyperlipidemia, unspecified: Secondary | ICD-10-CM | POA: Diagnosis not present

## 2015-04-01 DIAGNOSIS — I10 Essential (primary) hypertension: Secondary | ICD-10-CM | POA: Diagnosis not present

## 2015-04-01 DIAGNOSIS — Z720 Tobacco use: Secondary | ICD-10-CM | POA: Insufficient documentation

## 2015-04-01 DIAGNOSIS — E119 Type 2 diabetes mellitus without complications: Secondary | ICD-10-CM | POA: Diagnosis not present

## 2015-04-07 ENCOUNTER — Ambulatory Visit
Admission: RE | Admit: 2015-04-07 | Discharge: 2015-04-07 | Disposition: A | Discharge status: Home / Self Care | Payer: Medicare Other | Source: Ambulatory Visit | Attending: Family | Admitting: Family

## 2015-04-07 DIAGNOSIS — Z1231 Encounter for screening mammogram for malignant neoplasm of breast: Secondary | ICD-10-CM

## 2015-04-09 ENCOUNTER — Other Ambulatory Visit: Payer: Self-pay | Admitting: Family

## 2015-04-15 ENCOUNTER — Encounter: Payer: Self-pay | Admitting: Internal Medicine

## 2015-04-15 ENCOUNTER — Ambulatory Visit (INDEPENDENT_AMBULATORY_CARE_PROVIDER_SITE_OTHER): Payer: Medicare Other | Admitting: Internal Medicine

## 2015-04-15 VITALS — BP 110/78 | HR 60 | Ht 64.0 in | Wt 137.8 lb

## 2015-04-15 DIAGNOSIS — I10 Essential (primary) hypertension: Secondary | ICD-10-CM | POA: Diagnosis not present

## 2015-04-15 DIAGNOSIS — E785 Hyperlipidemia, unspecified: Secondary | ICD-10-CM

## 2015-04-15 DIAGNOSIS — R011 Cardiac murmur, unspecified: Secondary | ICD-10-CM

## 2015-04-15 NOTE — Progress Notes (Signed)
OFFICE NOTE  Chief Complaint:  Follow-up studies  Primary Care Physician: Maria Po, FNP  HPI:  [name]  Is a pleasant 65 year old female who is kindly referred to me by [name], FN-P,  For evaluation of malignant hypertension. [name] has a long-standing history of hypertension. She tells me in the past she was followed by [name] for her cardiac issues.  She has not been seen recently regarding her malignant hypertension. She does see [name] in nephrology for her hypertension. This is who placed her on minoxidil 2.5 mg daily. In the past she's had significant chronic kidney disease although recently her creatinine has improved significantly for unknown reasons. She does have difficult to control hypertension. She  Is on Norvasc, high-dose clonidine, metoprolol, and minoxidil. Blood pressure has been difficult to control and after just seeing [name] recently he started her on losartan HCTZ.  She has a history of 3 prior strokes, all thought to be related to hypertension. There is a strong family history of hypertension as well. She does not necessarily have a history of hypokalemia. 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 She does have a history of murmur in the past  With mitral regurgitation, but her recent echo in December 2016 only showed trivial mitral regurgitation with vigorous LV function and EF 65-70%.  She denies any chest pain or worsening shortness of breath.  [name] returns today for follow-up. She underwent renal Dopplers which showed no evidence for renal artery stenosis. She also had a plasma renin and aldosterone ratio to look for hyperaldosteronism. This was negative. Her blood pressure and please report is better today at 110/78. She reports she's decreased salt a lot. She is generally trying to eat a healthier diet and seems to be more compliant with her medications. Currently  her blood pressure medicines are being managed by Dr. Lorrene [name].  PMHx:  Past Medical History  Diagnosis Date  . Hypertension   . History of sebaceous cyst   . Tobacco abuse   . Cough   . Hyperthyroidism   . Hyperlipemia   . Diabetes mellitus, type 2 ([place])   . Chronic back pain   . Lumbar radiculopathy   . Shortness of breath 09/13/2013  . GERD (gastroesophageal reflux disease)   . Stroke [facility])     Past Surgical History  Procedure Laterality Date  . Retinopathy surgery  2013    Dr. Zigmund [name]  . Abdominal hysterectomy      FAMHx:  Family History  Problem Relation Age of Onset  . Hypertension Mother   . Sleep apnea Mother   . Cancer Father     lung cancer - smoker  . Hypertension Sister   . Hypertension Brother   . Hypertension Sister   . Diabetes Brother     SOCHx:   reports that she quit smoking about 9 months ago. Her smoking use included Cigarettes. She has a 17.5 pack-year smoking history. She has never used smokeless tobacco. She reports that she does not drink alcohol or use illicit drugs.  ALLERGIES:  No Known Allergies  ROS: Pertinent items noted in HPI and remainder of comprehensive ROS otherwise negative.  HOME MEDS: Current Outpatient Prescriptions  Medication Sig Dispense Refill  . amLODipine (NORVASC) 10 MG tablet Take 1 tablet (10 mg total) by mouth daily. 90 tablet 3  . aspirin 325 MG tablet Take 325 mg by mouth daily.    [name]  atorvastatin (LIPITOR) 80 MG tablet TAKE 1 TABLET EVERY DAY 90 tablet 3  . cloNIDine (CATAPRES) 0.2 MG tablet Take 1 tablet (0.2 mg total) by mouth 3 (three) times daily. 90 tablet 0  . glucose blood (ONETOUCH VERIO) test strip Use as instructed 100 each 12  . losartan-hydrochlorothiazide (HYZAAR) 100-25 MG tablet Take 1 tablet by mouth daily. 30 tablet 1  . metFORMIN (GLUCOPHAGE-XR) 750 MG 24 hr tablet TAKE 1 TABLET (750 MG TOTAL) BY MOUTH 2 (TWO) TIMES DAILY. 180 tablet 0  . metoprolol succinate (TOPROL-XL) 100 MG 24 hr tablet  Take 1 tablet (100 mg total) by mouth daily. Take with or immediately following a meal. 30 tablet 1  . minoxidil (LONITEN) 2.5 MG tablet Take 2.5 mg by mouth daily.     No current facility-administered medications for this visit.    LABS/IMAGING: No results found for this or any previous visit (from the past 48 hour(s)). No results found.  WEIGHTS: Wt Readings from Last 3 Encounters:  04/15/15 137 lb 12.8 oz (62.506 kg)  03/26/15 136 lb 14.4 oz (62.097 kg)  03/26/15 135 lb (61.236 kg)    VITALS: BP 110/78 mmHg  Pulse 60  Ht 5\' 4"  (1.626 m)  Wt 137 lb 12.8 oz (62.506 kg)  BMI 23.64 kg/m2  EXAM: Deferred  EKG: Deferred  ASSESSMENT: 1. Malignant hypertension - controllled 2. Strokes secondary to #1 3. Dyslipidemia 4. Murmur 5. DM2 6. CKD2  PLAN: 1.   [name] has markedly improved blood pressure control today. I can find no evidence of secondary causes of hypertension. I think compliance is been a big issue with control of her blood pressure which appears to be much better today. She's also done better at salt avoidance. I would advise no further changes to her blood pressure medicines which are being expertly managed by her nephrologist.  Follow-up with me as needed.  Maria Casino, MD, [facility] Attending Cardiologist [name] 04/15/2015, 9:27 AM

## 2015-04-15 NOTE — Patient Instructions (Signed)
Your physician recommends that you schedule a follow-up appointment as needed  

## 2015-05-06 DIAGNOSIS — R809 Proteinuria, unspecified: Secondary | ICD-10-CM | POA: Diagnosis not present

## 2015-05-06 DIAGNOSIS — E119 Type 2 diabetes mellitus without complications: Secondary | ICD-10-CM | POA: Diagnosis not present

## 2015-05-06 DIAGNOSIS — I10 Essential (primary) hypertension: Secondary | ICD-10-CM | POA: Diagnosis not present

## 2015-05-06 DIAGNOSIS — N189 Chronic kidney disease, unspecified: Secondary | ICD-10-CM | POA: Diagnosis not present

## 2015-05-22 ENCOUNTER — Other Ambulatory Visit: Payer: Self-pay | Admitting: Family

## 2015-07-04 ENCOUNTER — Other Ambulatory Visit: Payer: Self-pay | Admitting: Family

## 2015-07-06 ENCOUNTER — Encounter (INDEPENDENT_AMBULATORY_CARE_PROVIDER_SITE_OTHER): Payer: Medicare Other | Admitting: Ophthalmology

## 2015-07-06 DIAGNOSIS — H43813 Vitreous degeneration, bilateral: Secondary | ICD-10-CM

## 2015-07-06 DIAGNOSIS — H35033 Hypertensive retinopathy, bilateral: Secondary | ICD-10-CM | POA: Diagnosis not present

## 2015-07-06 DIAGNOSIS — E11319 Type 2 diabetes mellitus with unspecified diabetic retinopathy without macular edema: Secondary | ICD-10-CM | POA: Diagnosis not present

## 2015-07-06 DIAGNOSIS — E113593 Type 2 diabetes mellitus with proliferative diabetic retinopathy without macular edema, bilateral: Secondary | ICD-10-CM

## 2015-07-06 DIAGNOSIS — I10 Essential (primary) hypertension: Secondary | ICD-10-CM | POA: Diagnosis not present

## 2015-07-10 DIAGNOSIS — N183 Chronic kidney disease, stage 3 (moderate): Secondary | ICD-10-CM | POA: Diagnosis not present

## 2015-07-10 DIAGNOSIS — I10 Essential (primary) hypertension: Secondary | ICD-10-CM | POA: Diagnosis not present

## 2015-07-10 DIAGNOSIS — E119 Type 2 diabetes mellitus without complications: Secondary | ICD-10-CM | POA: Diagnosis not present

## 2015-07-20 ENCOUNTER — Other Ambulatory Visit: Payer: Self-pay | Admitting: Family

## 2015-08-28 ENCOUNTER — Ambulatory Visit (INDEPENDENT_AMBULATORY_CARE_PROVIDER_SITE_OTHER): Payer: Medicare Other | Admitting: Family

## 2015-08-28 ENCOUNTER — Encounter: Payer: Self-pay | Admitting: Family

## 2015-08-28 ENCOUNTER — Other Ambulatory Visit (INDEPENDENT_AMBULATORY_CARE_PROVIDER_SITE_OTHER): Payer: Medicare Other

## 2015-08-28 VITALS — BP 140/84 | HR 54 | Temp 97.7°F | Resp 16 | Ht 64.0 in | Wt 135.0 lb

## 2015-08-28 DIAGNOSIS — Z23 Encounter for immunization: Secondary | ICD-10-CM | POA: Diagnosis not present

## 2015-08-28 DIAGNOSIS — E1351 Other specified diabetes mellitus with diabetic peripheral angiopathy without gangrene: Secondary | ICD-10-CM | POA: Diagnosis not present

## 2015-08-28 LAB — COMPREHENSIVE METABOLIC PANEL
ALBUMIN: 3.4 g/dL — AB (ref 3.5–5.2)
ALK PHOS: 68 U/L (ref 39–117)
ALT: 61 U/L — ABNORMAL HIGH (ref 0–35)
AST: 30 U/L (ref 0–37)
BUN: 25 mg/dL — ABNORMAL HIGH (ref 6–23)
CHLORIDE: 103 meq/L (ref 96–112)
CO2: 28 mEq/L (ref 19–32)
Calcium: 9 mg/dL (ref 8.4–10.5)
Creatinine, Ser: 1.34 mg/dL — ABNORMAL HIGH (ref 0.40–1.20)
GFR: 50.96 mL/min — AB (ref >60.00)
GLUCOSE: 223 mg/dL — AB (ref 70–99)
POTASSIUM: 4.2 meq/L (ref 3.5–5.1)
Sodium: 135 mEq/L (ref 135–145)
TOTAL PROTEIN: 6.3 g/dL (ref 6.0–8.3)
Total Bilirubin: 0.8 mg/dL (ref 0.2–1.2)

## 2015-08-28 LAB — MICROALBUMIN / CREATININE URINE RATIO
Creatinine,U: 117.2 mg/dL
MICROALB UR: 12.8 mg/dL — AB (ref 0.0–1.9)
MICROALB/CREAT RATIO: 10.9 mg/g (ref 0.0–30.0)

## 2015-08-28 LAB — POCT GLYCOSYLATED HEMOGLOBIN (HGB A1C): Hemoglobin A1C: 9.4

## 2015-08-28 NOTE — Patient Instructions (Addendum)
Thank you for choosing [facility].  Summary/Instructions:  Please continue taking medications as prescribed.  Medication changes pending your blood work results.  Your prescription(s) have been submitted to your pharmacy or been printed and provided for you. Please take as directed and contact our office if you believe you are having problem(s) with the medication(s) or have any questions.  Please stop by the lab on the lower level of the building for your blood work. Your results will be released to [place] (or called to you) after review, usually within 72 hours after test completion. If any changes need to be made, you will be notified at that same time.  1. The lab is open from 7:30am to 5:30 pm Monday-Friday  2. No appointment is necessary  3. Fasting (if needed) is 6-8 hours after food and drink; black coffee and water  are okay   If your symptoms worsen or fail to improve, please contact our office for further instruction, or in case of emergency go directly to the emergency room at the closest medical facility.

## 2015-08-28 NOTE — Assessment & Plan Note (Signed)
Type 2 diabetes remains uncontrolled with A1c of 9.4 and home blood sugars ranging between 190 and 200. Continues to experience neuropathy located in her bilateral feet. Diabetic foot exam completed today. Diabetic eye exam up-to-date. Prevnar updated today. Maintained on atorvastatin and losartan for CAD risk reduction. Urine microalbumin ordered today. Changes pending blood work.

## 2015-08-28 NOTE — Progress Notes (Signed)
Subjective:    Patient ID: [name], female    DOB: Jun 26, 1950, 65 y.o.   MRN: [id number]  Chief Complaint  Patient presents with  . Follow-up    A1c and foot exam    HPI:  [name] is a 65 y.o. female who  has a past medical history of Chronic back pain; Cough; Diabetes mellitus, type 2 ([place]); GERD (gastroesophageal reflux disease); History of sebaceous cyst; Hyperlipemia; Hypertension; Hyperthyroidism; Lumbar radiculopathy; Shortness of breath (09/13/2013); Stroke [facility]); and Tobacco abuse. and presents today for a follow up office visit.   1.) Type 2 diabetes - This is a chronic condition. Currently maintained on metformin. Reports taking the medication as prescribed and denies adverse side effects. Home blood sugars have been averaging around 190-200 in the morning. Denies new onset symptoms of end organ damage. Indicates she is working on a low carbohydrate diet.   Lab Results  Component Value Date   HGBA1C 9.4 08/28/2015    No Known Allergies   Current Outpatient Prescriptions on File Prior to Visit  Medication Sig Dispense Refill  . amLODipine (NORVASC) 10 MG tablet Take 1 tablet (10 mg total) by mouth daily. 90 tablet 3  . aspirin 325 MG tablet Take 325 mg by mouth daily.    [name] atorvastatin (LIPITOR) 80 MG tablet TAKE 1 TABLET EVERY DAY 90 tablet 3  . cloNIDine (CATAPRES) 0.2 MG tablet Take 1 tablet (0.2 mg total) by mouth 3 (three) times daily. 90 tablet 0  . glucose blood (ONETOUCH VERIO) test strip Use as instructed 100 each 12  . losartan-hydrochlorothiazide (HYZAAR) 100-25 MG tablet TAKE 1 TABLET BY MOUTH DAILY. 90 tablet 1  . metFORMIN (GLUCOPHAGE-XR) 750 MG 24 hr tablet TAKE 1 TABLET (750 MG TOTAL) BY MOUTH 2 (TWO) TIMES DAILY. 180 tablet 0  . metoprolol succinate (TOPROL-XL) 100 MG 24 hr tablet Take 1 tablet (100 mg total) by mouth daily. Take with or immediately following a meal. 30 tablet 1  . minoxidil (LONITEN) 2.5 MG tablet Take 2.5 mg by mouth  daily.     No current facility-administered medications on file prior to visit.      Review of Systems  Constitutional: Negative for chills and fever.  Eyes:       Denies changes in vision  Respiratory: Negative for chest tightness and shortness of breath.   Cardiovascular: Negative for chest pain, palpitations and leg swelling.  Endocrine: Negative for polydipsia, polyphagia and polyuria.  Neurological: Positive for numbness. Negative for weakness.      Objective:    BP 140/84 (BP Location: Left Arm, Patient Position: Sitting, Cuff Size: Normal)   Pulse (!) 54   Temp 97.7 F (36.5 C) (Oral)   Resp 16   Ht 5\' 4"  (1.626 m)   Wt 135 lb (61.2 kg)   SpO2 99%   BMI 23.17 kg/m  Nursing note and vital signs reviewed.  Physical Exam  Constitutional: She is oriented to person, place, and time. She appears well-developed and well-nourished. No distress.  Cardiovascular: Normal rate, regular rhythm, normal heart sounds and intact distal pulses.   Pulmonary/Chest: Effort normal and breath sounds normal.  Musculoskeletal:  Diabetic Foot Exam - Simple   Simple Foot Form Diabetic Foot exam was performed with the following findings:  Yes  08/28/2015 11:38 AM  Visual Inspection No deformities, no ulcerations, no other skin breakdown bilaterally:  Yes Sensation Testing See comments:  Yes Pulse Check Posterior Tibialis and Dorsalis pulse intact bilaterally:  Yes Comments Gross sensation is intact with small areas of decreased sensation to  monofilament bilateral.   Neurological: She is alert and oriented to person, place, and time.  Skin: Skin is warm and dry.  Psychiatric: She has a normal mood and affect. Her behavior is normal. Judgment and thought content normal.       Assessment & Plan:   Problem List Items Addressed This Visit      Cardiovascular and Mediastinum   Secondary diabetes with peripheral vascular disease ([place]) - Primary    Type 2 diabetes remains uncontrolled  with A1c of 9.4 and home blood sugars ranging between 190 and 200. Continues to experience neuropathy located in her bilateral feet. Diabetic foot exam completed today. Diabetic eye exam up-to-date. Prevnar updated today. Maintained on atorvastatin and losartan for CAD risk reduction. Urine microalbumin ordered today. Changes pending blood work.      Relevant Orders   POCT HgB A1C (Completed)   Comprehensive metabolic panel   Urine Microalbumin w/creat. ratio   Pneumococcal conjugate vaccine 13-valent IM (Completed)    Other Visit Diagnoses   None.      I am having [name] maintain her aspirin, minoxidil, cloNIDine, metoprolol succinate, glucose blood, amLODipine, atorvastatin, metFORMIN, and losartan-hydrochlorothiazide.   Follow-up: Return in about 3 months (around 11/28/2015), or if symptoms worsen or fail to improve.  [name], FNP

## 2015-09-21 ENCOUNTER — Encounter: Payer: Self-pay | Admitting: Nurse Practitioner

## 2015-09-21 ENCOUNTER — Ambulatory Visit (INDEPENDENT_AMBULATORY_CARE_PROVIDER_SITE_OTHER): Payer: Medicare Other | Admitting: Nurse Practitioner

## 2015-09-21 VITALS — BP 125/69 | HR 60 | Ht 64.0 in | Wt 135.4 lb

## 2015-09-21 DIAGNOSIS — E785 Hyperlipidemia, unspecified: Secondary | ICD-10-CM | POA: Diagnosis not present

## 2015-09-21 DIAGNOSIS — I639 Cerebral infarction, unspecified: Secondary | ICD-10-CM

## 2015-09-21 DIAGNOSIS — I10 Essential (primary) hypertension: Secondary | ICD-10-CM

## 2015-09-21 DIAGNOSIS — I6381 Other cerebral infarction due to occlusion or stenosis of small artery: Secondary | ICD-10-CM

## 2015-09-21 NOTE — Progress Notes (Signed)
[facility]  [name]: [name] DOB: 1950/05/22   REASON FOR VISIT: Follow-up for history of stroke December 2016 HISTORY FROM: [name]    HISTORY OF PRESENT ILLNESS:UPDATE 08/28/2017CM. [name], 65 year old female returns for follow-up. She has a history of stroke in December 2016 with risk factors of uncontrolled hypertension hyperlipidemia and uncontrolled diabetes. She is currently on aspirin 325 daily without further stroke or TIA symptoms. She is on Lipitor daily for secondary stroke prevention as well. Most recent hemoglobin A1c 9.4 on 08/28/2015. She is on Glucophage. She has been told to limit her sugar. She gets no regular exercise. She claims she does her home exercise program established by physical therapy. She returns for reevaluation. She is ambulating with a cane and denies any recent falls HISTORY 03/23/15 PS65 year lady being seen today for first office follow-up visit for hospital admission for stroke in December 2016. [name] is a 65 y.o. 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 She stated that she ran out of her medications 3-4 days ago, and before yesterday she woke up " staggering and with poor body control" and subsequently noticed a numb/tingly sensation in her right foot. Denies associated weakness of Maria right leg, no numbness-tingling-weakness right arm. Further, no HA, vertigo, double vision, slurred speech, language or vision impairment. [name] said that Maria right foot numbness is going away but now is " more noticeable in Maria left foot". MRI brain was personally reviewed and showed a small acute infarct involving Maria left lateral geniculate body. Date last known well: 01/06/15 Time last known well: unable to deteermine tPA  Given: no, late presentation     CT scan of Maria head on admission showed no acute abnormality but MRI scan confirmed an acute infarct in Maria left lateral geniculate body with moderate chronic changes of small vessel disease. MRA of Maria brain showed no large vessel occlusion. Carotid ultrasound showed no significant extracranial stenosis. Transthoracic echo showed normal ejection fraction without cardiac source of embolism. LDL cholesterol was elevated and 90 mg percent and hemoglobin A1c at 11.2. [name]'s home dose of Lipitor was increased to 80 mg daily and she was started on aspirin 325 daily. She states she's done well since discharge. She still has some diminished fine motor skills in Maria left hand and drags her left foot while walking and occasionally staggers when she moves fast. She still has some residual numbness on Maria right side which has not improved. She states her blood pressure is well controlled at home but does fluctuate occasionally. It is elevated today at 152/79. She is seeing her primary care physician to adjust her blood pressure medications. [name] has quit smoking in June 2016. She has finished home physical and occupational therapy few weeks ago.   REVIEW OF SYSTEMS: Full 14 system review of systems performed and notable only for those listed, all others are neg:  Constitutional: neg  Cardiovascular: neg Ear/Nose/Throat: neg  Skin: neg Eyes: neg Respiratory: neg Gastroitestinal: neg  Hematology/Lymphatic: neg  Endocrine: neg Musculoskeletal:neg Allergy/Immunology: neg Neurological: neg Psychiatric: neg Sleep : neg   ALLERGIES: No Known Allergies  HOME MEDICATIONS: Outpatient Medications Prior to Visit  Medication Sig Dispense Refill  . amLODipine (NORVASC) 10 MG tablet Take 1 tablet (10 mg total) by mouth daily. 90 tablet 3  .  aspirin 325 MG tablet Take 325 mg by mouth daily.    [name] atorvastatin (LIPITOR) 80 MG tablet TAKE 1 TABLET EVERY DAY 90 tablet 3  .  cloNIDine (CATAPRES) 0.2 MG tablet Take 1 tablet (0.2 mg total) by mouth 3 (three) times daily. 90 tablet 0  . glucose blood (ONETOUCH VERIO) test strip Use as instructed 100 each 12  . losartan-hydrochlorothiazide (HYZAAR) 100-25 MG tablet TAKE 1 TABLET BY MOUTH DAILY. 90 tablet 1  . metFORMIN (GLUCOPHAGE-XR) 750 MG 24 hr tablet TAKE 1 TABLET (750 MG TOTAL) BY MOUTH 2 (TWO) TIMES DAILY. 180 tablet 0  . metoprolol succinate (TOPROL-XL) 100 MG 24 hr tablet Take 1 tablet (100 mg total) by mouth daily. Take with or immediately following a meal. 30 tablet 1  . minoxidil (LONITEN) 2.5 MG tablet Take 2.5 mg by mouth daily.     No facility-administered medications prior to visit.     PAST MEDICAL HISTORY: Past Medical History:  Diagnosis Date  . Chronic back pain   . Cough   . Diabetes mellitus, type 2 ([place])   . GERD (gastroesophageal reflux disease)   . History of sebaceous cyst   . Hyperlipemia   . Hypertension   . Hyperthyroidism   . Lumbar radiculopathy   . Shortness of breath 09/13/2013  . Stroke ([place])   . Tobacco abuse     PAST SURGICAL HISTORY: Past Surgical History:  Procedure Laterality Date  . ABDOMINAL HYSTERECTOMY    . RETINOPATHY SURGERY  2013   Dr. Zigmund Daniel    FAMILY HISTORY: Family History  Problem Relation Age of Onset  . Hypertension Mother   . Sleep apnea Mother   . Cancer Father     lung cancer - smoker  . Hypertension Sister   . Hypertension Brother   . Hypertension Sister   . Diabetes Brother     SOCIAL HISTORY: Social History   Social History  . Marital status: Divorced    Spouse name: N/A  . Number of children: 1  . Years of education: 51   Occupational History  . weaver Engineer, [employer]   Social History Main Topics  . Smoking status: Former Smoker    Packs/day: 0.50    Years: 35.00    Types: Cigarettes    Quit date: 07/07/2014  . Smokeless tobacco: Never Used     Comment: [name] advised to keep diary, develop strategy and  stop  . Alcohol use No     Comment: rare  . Drug use: No  . Sexual activity: Yes    Partners: Male    Birth control/ protection: Sponge   Other Topics Concern  . Not on file   Social History Narrative   11th Grade. Married '84- 13 years/ divorce. 1 son- '68. Sexually active-using condoms. 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 Regular Exercise- no      epworth sleepiness scale = 3 (03/26/2015)     PHYSICAL EXAM  Vitals:   09/21/15 1058  Weight: 135 lb 6.4 oz (61.4 kg)  Height: 5\' 4"  (1.626 m)   Body mass index is 23.24 kg/m. General: well developed, well nourished Middle-aged lady, seated, in no evident distress Head: head normocephalic and atraumatic.  Neck: supple with no carotid  bruits Cardiovascular: regular rate and rhythm, no murmurs Musculoskeletal: no deformity Skin:  no rash/petichiae Vascular:  Normal pulses all extremities  Neurological examination  Mental Status: Awake and fully alert. Oriented to place and time. Recent and remote memory intact.  Attention span, concentration and fund of knowledge appropriate. Mood and affect appropriate.  Cranial Nerves:  Pupils equal, briskly reactive to light. Extraocular movements full without nystagmus. Visual fields full to confrontation. Hearing intact. Facial sensation intact. Face, tongue, palate moves normally and symmetrically.  Motor: Normal bulk and tone. Normal strength in all tested extremity muscles. Sensory.: Diminished touch ,pinprick .position and vibratory sensation, on Maria right side and normal on Maria left  Coordination: Rapid alternating movements normal in all extremities. Finger-to-nose and heel-to-shin performed accurately bilaterally. Gait and Station: Arises from chair without difficulty. Stance is normal. Gait demonstrates normal stride length and balance . Able to heel, toe and tandem walk without difficulty. Ambulates with a single-point cane  Reflexes: 1+ and symmetric. Toes downgoing.  DIAGNOSTIC  DATA (LABS, IMAGING, TESTING) - I reviewed [name] records, labs, notes, testing and imaging myself where available.  Lab Results  Component Value Date   WBC 5.2 01/15/2015   HGB 9.3 (L) 01/15/2015   HCT 27.6 (L) 01/15/2015   MCV 92.4 01/15/2015   PLT 269.0 01/15/2015      Component Value Date/Time   NA 135 08/28/2015 1149   K 4.2 08/28/2015 1149   CL 103 08/28/2015 1149   CO2 28 08/28/2015 1149   GLUCOSE 223 (H) 08/28/2015 1149   BUN 25 (H) 08/28/2015 1149   CREATININE 1.34 (H) 08/28/2015 1149   CALCIUM 9.0 08/28/2015 1149   PROT 6.3 08/28/2015 1149   ALBUMIN 3.4 (L) 08/28/2015 1149   AST 30 08/28/2015 1149   ALT 61 (H) 08/28/2015 1149   ALKPHOS 68 08/28/2015 1149   BILITOT 0.8 08/28/2015 1149   GFRNONAA 38 (L) 01/10/2015 0225   GFRAA 45 (L) 01/10/2015 0225   Lab Results  Component Value Date   CHOL 126 01/15/2015   HDL 26.00 (L) 01/15/2015   LDLCALC 75 01/15/2015   LDLDIRECT 130.4 12/13/2012   TRIG 124.0 01/15/2015   CHOLHDL 5 01/15/2015   Lab Results  Component Value Date   HGBA1C 9.4 08/28/2015   Lab Results  Component Value Date   [id number] 01/08/2015   Lab Results  Component Value Date   TSH 1.08 01/15/2015      ASSESSMENT AND PLAN 65 year With left internal capsule infarct secondary to small vessel disease in December 2016 with vascular risk factors of hypertension, hyperlipidemia, diabetes. [name] stoped smoking June 2016  PLAN: Continue aspirin 325 mg daily  for secondary stroke prevention  Strict control of hypertension with blood pressure goal below 130/90, today's reading 125/69 continue blood pressure medications  diabetes with hemoglobin A1c goal below 6.5% most recent 9.4 on 08/28/2015  Lipids with LDL cholesterol goal below 70 mg/dL continue Lipitor.  Eat a healthy diet with plenty of whole grains, cereals, fruits and vegetables, limit sugar  exercise regularly and maintain ideal body weight Follow-up in 6 months Greater than 50%  of time during this 25 minute visit was spent on counseling,explanation and education of diagnosis, planning of further management. Dennie Bible, [facility], [facility], APRN  [facility] [address], [place], [place] [address] [phone]

## 2015-09-21 NOTE — Patient Instructions (Addendum)
Continue aspirin 325 mg daily  for secondary stroke prevention  Strict control of hypertension with blood pressure goal below 130/90, today's reading 125/69 continue blood pressure medications diabetes with hemoglobin A1c goal below 6.5% most recent 9.4 on 08/28/2015  Lipids with LDL cholesterol goal below 70 mg/dL continue Lipitor.  Eat a healthy diet with plenty of whole grains, cereals, fruits and vegetables, limit sugar  exercise regularly and maintain ideal body weight Follow-up in 6 months

## 2015-09-24 DIAGNOSIS — I10 Essential (primary) hypertension: Secondary | ICD-10-CM | POA: Diagnosis not present

## 2015-09-24 DIAGNOSIS — N183 Chronic kidney disease, stage 3 (moderate): Secondary | ICD-10-CM | POA: Diagnosis not present

## 2015-09-24 DIAGNOSIS — E119 Type 2 diabetes mellitus without complications: Secondary | ICD-10-CM | POA: Diagnosis not present

## 2015-09-24 NOTE — Progress Notes (Signed)
I agree with the above plan 

## 2015-09-30 ENCOUNTER — Other Ambulatory Visit: Payer: Self-pay | Admitting: Family

## 2015-11-05 ENCOUNTER — Ambulatory Visit (INDEPENDENT_AMBULATORY_CARE_PROVIDER_SITE_OTHER): Payer: Medicare Other | Admitting: Ophthalmology

## 2015-11-09 ENCOUNTER — Ambulatory Visit (INDEPENDENT_AMBULATORY_CARE_PROVIDER_SITE_OTHER): Payer: Medicare Other | Admitting: Ophthalmology

## 2015-11-09 DIAGNOSIS — H43813 Vitreous degeneration, bilateral: Secondary | ICD-10-CM

## 2015-11-09 DIAGNOSIS — E11319 Type 2 diabetes mellitus with unspecified diabetic retinopathy without macular edema: Secondary | ICD-10-CM | POA: Diagnosis not present

## 2015-11-09 DIAGNOSIS — E113593 Type 2 diabetes mellitus with proliferative diabetic retinopathy without macular edema, bilateral: Secondary | ICD-10-CM | POA: Diagnosis not present

## 2015-11-09 DIAGNOSIS — H35033 Hypertensive retinopathy, bilateral: Secondary | ICD-10-CM

## 2015-11-09 DIAGNOSIS — I10 Essential (primary) hypertension: Secondary | ICD-10-CM | POA: Diagnosis not present

## 2016-01-07 ENCOUNTER — Other Ambulatory Visit: Payer: Self-pay | Admitting: Family

## 2016-01-11 ENCOUNTER — Ambulatory Visit (INDEPENDENT_AMBULATORY_CARE_PROVIDER_SITE_OTHER)
Admission: RE | Admit: 2016-01-11 | Discharge: 2016-01-11 | Disposition: A | Discharge status: Home / Self Care | Payer: Medicare Other | Source: Ambulatory Visit | Attending: Family | Admitting: Family

## 2016-01-11 ENCOUNTER — Ambulatory Visit (INDEPENDENT_AMBULATORY_CARE_PROVIDER_SITE_OTHER): Payer: Medicare Other | Admitting: Family

## 2016-01-11 ENCOUNTER — Other Ambulatory Visit (INDEPENDENT_AMBULATORY_CARE_PROVIDER_SITE_OTHER): Payer: Medicare Other

## 2016-01-11 ENCOUNTER — Encounter: Payer: Self-pay | Admitting: Family

## 2016-01-11 VITALS — BP 162/80 | HR 60 | Temp 98.4°F | Resp 16 | Ht 64.0 in | Wt 134.0 lb

## 2016-01-11 DIAGNOSIS — Z7289 Other problems related to lifestyle: Secondary | ICD-10-CM

## 2016-01-11 DIAGNOSIS — Z23 Encounter for immunization: Secondary | ICD-10-CM | POA: Diagnosis not present

## 2016-01-11 DIAGNOSIS — I6381 Other cerebral infarction due to occlusion or stenosis of small artery: Secondary | ICD-10-CM

## 2016-01-11 DIAGNOSIS — Z0000 Encounter for general adult medical examination without abnormal findings: Secondary | ICD-10-CM | POA: Diagnosis not present

## 2016-01-11 DIAGNOSIS — E2839 Other primary ovarian failure: Secondary | ICD-10-CM | POA: Diagnosis not present

## 2016-01-11 DIAGNOSIS — I10 Essential (primary) hypertension: Secondary | ICD-10-CM | POA: Diagnosis not present

## 2016-01-11 DIAGNOSIS — I639 Cerebral infarction, unspecified: Secondary | ICD-10-CM | POA: Diagnosis not present

## 2016-01-11 DIAGNOSIS — E1351 Other specified diabetes mellitus with diabetic peripheral angiopathy without gangrene: Secondary | ICD-10-CM

## 2016-01-11 LAB — LIPID PANEL
CHOL/HDL RATIO: 4
Cholesterol: 141 mg/dL (ref 0–200)
HDL: 38.4 mg/dL — ABNORMAL LOW (ref >39.00)
LDL Cholesterol: 84 mg/dL (ref 0–99)
NONHDL: 102.5
Triglycerides: 95 mg/dL (ref 0.0–149.0)
VLDL: 19 mg/dL (ref 0.0–40.0)

## 2016-01-11 LAB — CBC
HCT: 31.5 % — ABNORMAL LOW (ref 36.0–46.0)
Hemoglobin: 10.4 g/dL — ABNORMAL LOW (ref 12.0–15.0)
MCHC: 32.9 g/dL (ref 30.0–36.0)
MCV: 92.5 fl (ref 78.0–100.0)
Platelets: 244 10*3/uL (ref 150.0–400.0)
RBC: 3.41 Mil/uL — ABNORMAL LOW (ref 3.87–5.11)
RDW: 12.7 % (ref 11.5–15.5)
WBC: 6.1 10*3/uL (ref 4.0–10.5)

## 2016-01-11 LAB — COMPREHENSIVE METABOLIC PANEL
ALT: 28 U/L (ref 0–35)
AST: 19 U/L (ref 0–37)
Albumin: 3.4 g/dL — ABNORMAL LOW (ref 3.5–5.2)
Alkaline Phosphatase: 85 U/L (ref 39–117)
BILIRUBIN TOTAL: 1 mg/dL (ref 0.2–1.2)
BUN: 31 mg/dL — AB (ref 6–23)
CO2: 29 meq/L (ref 19–32)
CREATININE: 1.55 mg/dL — AB (ref 0.40–1.20)
Calcium: 8.9 mg/dL (ref 8.4–10.5)
Chloride: 96 mEq/L (ref 96–112)
GFR: 43.03 mL/min — ABNORMAL LOW (ref >60.00)
GLUCOSE: 242 mg/dL — AB (ref 70–99)
Potassium: 3.7 mEq/L (ref 3.5–5.1)
Sodium: 133 mEq/L — ABNORMAL LOW (ref 135–145)
Total Protein: 6.4 g/dL (ref 6.0–8.3)

## 2016-01-11 LAB — VITAMIN D 25 HYDROXY (VIT D DEFICIENCY, FRACTURES): VITD: 10.99 ng/mL — ABNORMAL LOW (ref 30.00–100.00)

## 2016-01-11 LAB — HEPATITIS C ANTIBODY: HCV AB: NEGATIVE

## 2016-01-11 LAB — HEMOGLOBIN A1C: HEMOGLOBIN A1C: 10 % — AB (ref 4.6–6.5)

## 2016-01-11 NOTE — Assessment & Plan Note (Signed)
Reviewed and updated patient's medical, surgical, family and social history. Medications and allergies were also reviewed. Basic screenings for depression, activities of daily living, hearing, cognition and safety were performed. Provider list was updated and health plan was provided to the patient.  

## 2016-01-11 NOTE — Assessment & Plan Note (Signed)
Most recent A1c of 9.4 indicating poor control type 2 diabetes. Currently maintained on metformin. Will require additional agents if A1c continues to rise or remain elevated. Obtain A1c. Maintained on atorvastatin and losartan for CAD risk reduction. All other diabetic screenings are up-to-date per recommendations. Continue to monitor blood sugars at home 1-2 times daily. Follow-up pending A1c results.

## 2016-01-11 NOTE — Progress Notes (Signed)
Subjective:    Patient ID: [name], female    DOB: 08/11/1950, 65 y.o.   MRN: [id number]  Chief Complaint  Patient presents with  . Medicare Wellness    fasting    HPI:  [name] is a 65 y.o. female who presents today for a Medicare Annual Wellness/Physical exam.    1) Health Maintenance -   Diet - Averaging about 2-3 meals per day consisting of a regular diet; Caffeine intake of 1-2 cups daily  Exercise - Walks with a cane; Does some leg lifts and walks occasionally; nothing structured.   2) Preventative Exams / Immunizations:  Dental -- Due for exam   Vision -- Up to date   Health Maintenance  Topic Date Due  . Hepatitis C Screening  [birth year]  . HIV Screening  01/27/1965  . PAP SMEAR  10/21/2013  . DEXA SCAN  01/28/2015  . INFLUENZA VACCINE  08/25/2015  . HEMOGLOBIN A1C  02/28/2016  . OPHTHALMOLOGY EXAM  06/24/2016  . FOOT EXAM  08/27/2016  . PNA vac Low Risk Adult (2 of 2 - PPSV23) 12/12/2016  . MAMMOGRAM  04/06/2017  . TETANUS/TDAP  09/10/2018  . COLONOSCOPY  09/12/2018  . ZOSTAVAX  Completed     Immunization History  Administered Date(s) Administered  . Influenza Whole 10/25/2011  . Influenza, High Dose Seasonal PF 01/11/2016  . Influenza,inj,Quad PF,36+ Mos 12/13/2012, 10/07/2014  . Pneumococcal Conjugate-13 08/28/2015  . Pneumococcal Polysaccharide-23 12/13/2011  . Td 09/09/2008  . Zoster 12/13/2012    RISK FACTORS  Tobacco History  Smoking Status  . Former Smoker  . Packs/day: 0.50  . Years: 35.00  . Types: Cigarettes  . Quit date: 07/07/2014  Smokeless Tobacco  . Never Used    Comment: patient advised to keep diary, develop strategy and stop     Cardiac risk factors: diabetes mellitus, dyslipidemia and hypertension.  Depression Screen  Depression screen [facility] 2/9 01/11/2016  Decreased Interest 0  Down, Depressed, Hopeless 0  PHQ - 2 Score 0     Activities of Daily Living In your present state of health, do  you have any difficulty performing the following activities?:  Driving? No Managing money?  No Feeding yourself? No Getting from bed to chair? No Climbing a flight of stairs? No Preparing food and eating?: No Bathing or showering? No Getting dressed: No Getting to the toilet? No Using the toilet: No Moving around from place to place: No In the past year have you fallen or had a near fall?:No   Home Safety Has smoke detector and wears seat belts. No firearms. No excess sun exposure. Are there smokers in your home (other than you)?  No Do you feel safe at home?  Yes  Hearing Difficulties: No Do you often ask people to speak up or repeat themselves? No Do you experience ringing or noises in your ears? No  Do you have difficulty understanding soft or whispered voices? No    Cognitive Testing  Alert? Yes   Normal Appearance? Yes  Oriented to person? Yes  Place? Yes   Time? Yes  Recall of three objects?  Yes  Can perform simple calculations? Yes  Displays appropriate judgment? Yes  Can read the correct time from a watch face? Yes  Do you feel that you have a problem with memory? No  Do you often misplace items? No   Advanced Directives have been discussed with the patient? Yes   Current Physicians/Providers and Suppliers  1.  Terri Piedra, FNP - Internal Medicine  Indicate any recent Medical Services you may have received from other than [facility] providers in the past year (date may be approximate).  All answers were reviewed with the patient and necessary referrals were made:  [name], [place]   01/11/2016    No Known Allergies   Outpatient Medications Prior to Visit  Medication Sig Dispense Refill  . amLODipine (NORVASC) 10 MG tablet Take 1 tablet (10 mg total) by mouth daily. 90 tablet 3  . aspirin 325 MG tablet Take 325 mg by mouth daily.    [name] atorvastatin (LIPITOR) 80 MG tablet TAKE 1 TABLET EVERY DAY 90 tablet 3  . cloNIDine (CATAPRES) 0.2 MG tablet Take 1  tablet (0.2 mg total) by mouth 3 (three) times daily. 90 tablet 0  . furosemide (LASIX) 40 MG tablet Take 40 mg by mouth daily.    [name] glucose blood (ONETOUCH VERIO) test strip Use as instructed 100 each 12  . losartan-hydrochlorothiazide (HYZAAR) 100-25 MG tablet TAKE 1 TABLET BY MOUTH DAILY. 90 tablet 1  . metFORMIN (GLUCOPHAGE-XR) 750 MG 24 hr tablet TAKE 1 TABLET (750 MG TOTAL) BY MOUTH 2 (TWO) TIMES DAILY. 180 tablet 1  . metoprolol succinate (TOPROL-XL) 100 MG 24 hr tablet Take 1 tablet (100 mg total) by mouth daily. Take with or immediately following a meal. 30 tablet 1  . minoxidil (LONITEN) 2.5 MG tablet Take 2.5 mg by mouth daily.     No facility-administered medications prior to visit.      Past Medical History:  Diagnosis Date  . Chronic back pain   . Cough   . Diabetes mellitus, type 2 ([place])   . GERD (gastroesophageal reflux disease)   . History of sebaceous cyst   . Hyperlipemia   . Hypertension   . Hyperthyroidism   . Lumbar radiculopathy   . Shortness of breath 09/13/2013  . Stroke ([place])   . Tobacco abuse      Past Surgical History:  Procedure Laterality Date  . ABDOMINAL HYSTERECTOMY    . RETINOPATHY SURGERY  2013   Dr. Zigmund Daniel     Family History  Problem Relation Age of Onset  . Hypertension Mother   . Sleep apnea Mother   . Cancer Father     lung cancer - smoker  . Hypertension Sister   . Hypertension Brother   . Hypertension Sister   . Diabetes Brother      Social History   Social History  . Marital status: Divorced    Spouse name: N/A  . Number of children: 1  . Years of education: 93   Occupational History  . [employer]   Social History Main Topics  . Smoking status: Former Smoker    Packs/day: 0.50    Years: 35.00    Types: Cigarettes    Quit date: 07/07/2014  . Smokeless tobacco: Never Used     Comment: patient advised to keep diary, develop strategy and stop  . Alcohol use No  . Drug use: No  . Sexual  activity: Yes    Partners: Male    Birth control/ protection: Sponge   Other Topics Concern  . Not on file   Social History Narrative   Denies abuse and feels safe at home.     Review of Systems  Constitutional: Denies fever, chills, fatigue, or significant weight gain/loss. HENT: Head: Denies headache or neck pain Ears: Denies changes in hearing, ringing in ears, earache, drainage Nose:  Denies discharge, stuffiness, itching, nosebleed, sinus pain Throat: Denies sore throat, hoarseness, dry mouth, sores, thrush Eyes: Denies loss/changes in vision, pain, redness, blurry/double vision, flashing lights Cardiovascular: Denies chest pain/discomfort, tightness, palpitations, shortness of breath with activity, difficulty lying down, swelling, sudden awakening with shortness of breath Respiratory: Denies shortness of breath, cough, sputum production, wheezing Gastrointestinal: Denies dysphasia, heartburn, change in appetite, nausea, change in bowel habits, rectal bleeding, constipation, diarrhea, yellow skin or eyes Genitourinary: Denies frequency, urgency, burning/pain, blood in urine, incontinence, change in urinary strength. Musculoskeletal: Denies muscle/joint pain, stiffness, back pain, redness or swelling of joints, trauma Skin: Denies rashes, lumps, itching, dryness, color changes, or hair/nail changes Neurological: Denies dizziness, fainting, seizures, weakness, numbness, tingling, tremor Psychiatric - Denies nervousness, stress, depression or memory loss Endocrine: Denies heat or cold intolerance, sweating, frequent urination, excessive thirst, changes in appetite Hematologic: Denies ease of bruising or bleeding    Objective:     BP (!) 162/80 (BP Location: Left Arm, Patient Position: Sitting, Cuff Size: Normal)   Pulse 60   Temp 98.4 F (36.9 C) (Oral)   Resp 16   Ht 5\' 4"  (1.626 m)   Wt 134 lb (60.8 kg)   SpO2 95%   BMI 23.00 kg/m  Nursing note and vital signs  reviewed.   Visual Acuity Screening   Right eye Left eye Both eyes  Without correction:     With correction: 20/20 20/25 20/25    Physical Exam  Constitutional: She is oriented to person, place, and time. She appears well-developed and well-nourished.  HENT:  Head: Normocephalic.  Right Ear: Hearing, tympanic membrane, external ear and ear canal normal.  Left Ear: Hearing, tympanic membrane, external ear and ear canal normal.  Nose: Nose normal.  Mouth/Throat: Uvula is midline, oropharynx is clear and moist and mucous membranes are normal.  Eyes: Conjunctivae and EOM are normal. Pupils are equal, round, and reactive to light.  Neck: Neck supple. No JVD present. No tracheal deviation present. No thyromegaly present.  Cardiovascular: Normal rate, regular rhythm, normal heart sounds and intact distal pulses.   Pulmonary/Chest: Effort normal and breath sounds normal.  Abdominal: Soft. Bowel sounds are normal. She exhibits no distension and no mass. There is no tenderness. There is no rebound and no guarding.  Musculoskeletal: Normal range of motion. She exhibits no edema or tenderness.  Lymphadenopathy:    She has no cervical adenopathy.  Neurological: She is alert and oriented to person, place, and time. She has normal reflexes. No cranial nerve deficit. She exhibits normal muscle tone. Coordination normal.  Skin: Skin is warm and dry.  Psychiatric: She has a normal mood and affect. Her behavior is normal. Judgment and thought content normal.       Assessment & Plan:   During the course of the visit the patient was educated and counseled about appropriate screening and preventive services including:    Pneumococcal vaccine   Influenza vaccine  Colorectal cancer screening  Diabetes screening  Nutrition counseling   Diet review for nutrition referral? Yes ____  Not Indicated _X___   Patient Instructions (the written plan) was given to the patient.  Medicare Attestation I  have personally reviewed: The patient's medical and social history Their use of alcohol, tobacco or illicit drugs Their current medications and supplements The patient's functional ability including ADLs,fall risks, home safety risks, cognitive, and hearing and visual impairment Diet and physical activities Evidence for depression or mood disorders  The patient's weight, height, BMI,  have been recorded  in the chart.  I have made referrals, counseling, and provided education to the patient based on review of the above and I have provided the patient with a written personalized care plan for preventive services.     Problem List Items Addressed This Visit      Cardiovascular and Mediastinum   Secondary diabetes with peripheral vascular disease ([place])    Most recent A1c of 9.4 indicating poor control type 2 diabetes. Currently maintained on metformin. Will require additional agents if A1c continues to rise or remain elevated. Obtain A1c. Maintained on atorvastatin and losartan for CAD risk reduction. All other diabetic screenings are up-to-date per recommendations. Continue to monitor blood sugars at home 1-2 times daily. Follow-up pending A1c results.      Relevant Orders   Hemoglobin A1c (Completed)   Hypertension    Blood pressure remains elevated above goal 140/90 with current medication regimen and question patient compliance. Encouraged to monitor blood pressure at home. Continue current dosage of amlodipine, clonidine, furosemide, metoprolol, and losartan-hydrochlorothiazide.        Nervous and Auditory   Lacunar infarct, acute ([place])    Stroke continues to be monitored through risk factor management with control of hyperlipidemia, hypertension, and diabetes. Chronic conditions remain labile with current medication regimens. Emphasize importance of taking medication as prescribed to help reduce risk for stroke in the future. Continue to monitor.        Other   Routine health  maintenance    1) Anticipatory Guidance: Discussed importance of wearing a seatbelt while driving and not texting while driving; changing batteries in smoke detector at least once annually; wearing suntan lotion when outside; eating a balanced and moderate diet; getting physical activity at least 30 minutes per day.  2) Immunizations / Screenings / Labs:  Influenza updated today. All other immunizations are up-to-date per recommendations. Due for a cervical cancer screening with referral to gynecology placed. Obtain hepatitis C antibody for hepatitis C screening. Obtain A1c for diabetes screenings. Obtain DEXA for bone mineral density testing. Obtain vitamin D for vitamin D deficiency screening. All other screenings are up-to-date per recommendations. Obtain CBC, CMET, and lipid profile.    Overall well exam with risk factors for cardiovascular disease including previous stroke, hypertension, and hyperlipidemia. Chronic conditions addressed under each separate problem.There is question regarding medication compliance.She exercises infrequently with the goal of increasing physical activity. Continues to ambulate with a cane. Continue other healthy lifestyle behaviors and choices. Follow-up prevention exam in 1 year. Follow-up office visit for chronic conditions.      Relevant Orders   CBC (Completed)   Comprehensive metabolic panel (Completed)   Lipid panel (Completed)   VITAMIN D 25 Hydroxy (Vit-D Deficiency, Fractures) (Completed)   Ambulatory referral to Gynecology   Medicare annual wellness visit, initial - Primary    Reviewed and updated patient's medical, surgical, family and social history. Medications and allergies were also reviewed. Basic screenings for depression, activities of daily living, hearing, cognition and safety were performed. Provider list was updated and health plan was provided to the patient.        Other Visit Diagnoses    Other problems related to lifestyle        Relevant Orders   Hepatitis C antibody   Estrogen deficiency       Relevant Orders   DG BONE DENSITY (DXA)   Encounter for immunization       Relevant Orders   Flu vaccine HIGH DOSE PF (Completed)  I am having Ms. Fehrman maintain her aspirin, minoxidil, cloNIDine, metoprolol succinate, glucose blood, amLODipine, atorvastatin, furosemide, metFORMIN, and losartan-hydrochlorothiazide.   Follow-up: Return in about 3 months (around 04/10/2016), or if symptoms worsen or fail to improve.   [name], FNP

## 2016-01-11 NOTE — Patient Instructions (Addendum)
Thank you for choosing [facility].  SUMMARY AND INSTRUCTIONS:  Continue to monitor blood pressure at home and bring cuff for next visit.   Medication:  Please continue to take your medication as prescribed.   For constipation - please increase fiber and water intake. May use Metamucil or Benefiber if needed.  Labs:  Please stop by the lab on the lower level of the building for your blood work. Your results will be released to [facility] (or called to you) after review, usually within 72 hours after test completion. If any changes need to be made, you will be notified at that same time.  1.) The lab is open from 7:30am to 5:30 pm Monday-Friday 2.) No appointment is necessary 3.) Fasting (if needed) is 6-8 hours after food and drink; black coffee and water are okay   Imaging / Radiology:  Please schedule a time for your DEXA.   Please stop by radiology on the basement level of the building for your x-rays. Your results will be released to [facility] (or called to you) after review, usually within 72 hours after test completion. If any treatments or changes are necessary, you will be notified at that same time.  Referrals:  You will receive a call for gynecology.  Referrals have been made during this visit. You should expect to hear back from our schedulers in about 7-10 days in regards to establishing an appointment with the specialists we discussed.   Follow up:  If your symptoms worsen or fail to improve, please contact our office for further instruction, or in case of emergency go directly to the emergency room at the closest medical facility.    Health Maintenance  Topic Date Due  . Hepatitis C Screening  26-Feb-1950  . HIV Screening  01/27/1965  . PAP SMEAR  10/21/2013  . DEXA SCAN  01/28/2015  . INFLUENZA VACCINE  08/25/2015  . HEMOGLOBIN A1C  02/28/2016  . OPHTHALMOLOGY EXAM  06/24/2016  . FOOT EXAM  08/27/2016  . PNA vac Low Risk Adult (2 of 2 - PPSV23)  12/12/2016  . MAMMOGRAM  04/06/2017  . TETANUS/TDAP  09/10/2018  . COLONOSCOPY  09/12/2018  . ZOSTAVAX  Completed   Health Maintenance, Female Introduction Adopting a healthy lifestyle and getting preventive care can go a long way to promote health and wellness. Talk with your health care provider about what schedule of regular examinations is right for you. This is a good chance for you to check in with your provider about disease prevention and staying healthy. In between checkups, there are plenty of things you can do on your own. Experts have done a lot of research about which lifestyle changes and preventive measures are most likely to keep you healthy. Ask your health care provider for more information. Weight and diet Eat a healthy diet  Be sure to include plenty of vegetables, fruits, low-fat dairy products, and lean protein.  Do not eat a lot of foods high in solid fats, added sugars, or salt.  Get regular exercise. This is one of the most important things you can do for your health.  Most adults should exercise for at least 150 minutes each week. The exercise should increase your heart rate and make you sweat (moderate-intensity exercise).  Most adults should also do strengthening exercises at least twice a week. This is in addition to the moderate-intensity exercise. Maintain a healthy weight  Body mass index (BMI) is a measurement that can be used to identify possible weight  problems. It estimates body fat based on height and weight. Your health care provider can help determine your BMI and help you achieve or maintain a healthy weight.  For females 52 years of age and older:  A BMI below 18.5 is considered underweight.  A BMI of 18.5 to 24.9 is normal.  A BMI of 25 to 29.9 is considered overweight.  A BMI of 30 and above is considered obese. Watch levels of cholesterol and blood lipids  You should start having your blood tested for lipids and cholesterol at 65 years  of age, then have this test every 5 years.  You may need to have your cholesterol levels checked more often if:  Your lipid or cholesterol levels are high.  You are older than 65 years of age.  You are at high risk for heart disease. Cancer screening Lung Cancer  Lung cancer screening is recommended for adults 77-50 years old who are at high risk for lung cancer because of a history of smoking.  A yearly low-dose CT scan of the lungs is recommended for people who:  Currently smoke.  Have quit within the past 15 years.  Have at least a 30-pack-year history of smoking. A pack year is smoking an average of one pack of cigarettes a day for 1 year.  Yearly screening should continue until it has been 15 years since you quit.  Yearly screening should stop if you develop a health problem that would prevent you from having lung cancer treatment. Breast Cancer  Practice breast self-awareness. This means understanding how your breasts normally appear and feel.  It also means doing regular breast self-exams. Let your health care provider know about any changes, no matter how small.  If you are in your 20s or 30s, you should have a clinical breast exam (CBE) by a health care provider every 1-3 years as part of a regular health exam.  If you are 28 or older, have a CBE every year. Also consider having a breast X-ray (mammogram) every year.  If you have a family history of breast cancer, talk to your health care provider about genetic screening.  If you are at high risk for breast cancer, talk to your health care provider about having an MRI and a mammogram every year.  Breast cancer gene (BRCA) assessment is recommended for women who have family members with BRCA-related cancers. BRCA-related cancers include:  Breast.  Ovarian.  Tubal.  Peritoneal cancers.  Results of the assessment will determine the need for genetic counseling and BRCA1 and BRCA2 testing. Cervical Cancer  Your  health care provider may recommend that you be screened regularly for cancer of the pelvic organs (ovaries, uterus, and vagina). This screening involves a pelvic examination, including checking for microscopic changes to the surface of your cervix (Pap test). You may be encouraged to have this screening done every 3 years, beginning at age 73.  For women ages 25-65, health care providers may recommend pelvic exams and Pap testing every 3 years, or they may recommend the Pap and pelvic exam, combined with testing for human papilloma virus (HPV), every 5 years. Some types of HPV increase your risk of cervical cancer. Testing for HPV may also be done on women of any age with unclear Pap test results.  Other health care providers may not recommend any screening for nonpregnant women who are considered low risk for pelvic cancer and who do not have symptoms. Ask your health care provider if a screening pelvic  exam is right for you.  If you have had past treatment for cervical cancer or a condition that could lead to cancer, you need Pap tests and screening for cancer for at least 20 years after your treatment. If Pap tests have been discontinued, your risk factors (such as having a new sexual partner) need to be reassessed to determine if screening should resume. Some women have medical problems that increase the chance of getting cervical cancer. In these cases, your health care provider may recommend more frequent screening and Pap tests. Colorectal Cancer  This type of cancer can be detected and often prevented.  Routine colorectal cancer screening usually begins at 65 years of age and continues through 65 years of age.  Your health care provider may recommend screening at an earlier age if you have risk factors for colon cancer.  Your health care provider may also recommend using home test kits to check for hidden blood in the stool.  A small camera at the end of a tube can be used to examine your  colon directly (sigmoidoscopy or colonoscopy). This is done to check for the earliest forms of colorectal cancer.  Routine screening usually begins at age 70.  Direct examination of the colon should be repeated every 5-10 years through 65 years of age. However, you may need to be screened more often if early forms of precancerous polyps or small growths are found. Skin Cancer  Check your skin from head to toe regularly.  Tell your health care provider about any new moles or changes in moles, especially if there is a change in a mole's shape or color.  Also tell your health care provider if you have a mole that is larger than the size of a pencil eraser.  Always use sunscreen. Apply sunscreen liberally and repeatedly throughout the day.  Protect yourself by wearing long sleeves, pants, a wide-brimmed hat, and sunglasses whenever you are outside. Heart disease, diabetes, and high blood pressure  High blood pressure causes heart disease and increases the risk of stroke. High blood pressure is more likely to develop in:  People who have blood pressure in the high end of the normal range (130-139/85-89 mm Hg).  People who are overweight or obese.  People who are African American.  If you are 81-43 years of age, have your blood pressure checked every 3-5 years. If you are 79 years of age or older, have your blood pressure checked every year. You should have your blood pressure measured twice-once when you are at a hospital or clinic, and once when you are not at a hospital or clinic. Record the average of the two measurements. To check your blood pressure when you are not at a hospital or clinic, you can use:  An automated blood pressure machine at a pharmacy.  A home blood pressure monitor.  If you are between 59 years and 28 years old, ask your health care provider if you should take aspirin to prevent strokes.  Have regular diabetes screenings. This involves taking a blood sample to  check your fasting blood sugar level.  If you are at a normal weight and have a low risk for diabetes, have this test once every three years after 64 years of age.  If you are overweight and have a high risk for diabetes, consider being tested at a younger age or more often. Preventing infection Hepatitis B  If you have a higher risk for hepatitis B, you should be screened for  this virus. You are considered at high risk for hepatitis B if:  You were born in a country where hepatitis B is common. Ask your health care provider which countries are considered high risk.  Your parents were born in a high-risk country, and you have not been immunized against hepatitis B (hepatitis B vaccine).  You have HIV or AIDS.  You use needles to inject street drugs.  You live with someone who has hepatitis B.  You have had sex with someone who has hepatitis B.  You get hemodialysis treatment.  You take certain medicines for conditions, including cancer, organ transplantation, and autoimmune conditions. Hepatitis C  Blood testing is recommended for:  Everyone born from [birth year] through 1965.  Anyone with known risk factors for hepatitis C. Sexually transmitted infections (STIs)  You should be screened for sexually transmitted infections (STIs) including gonorrhea and chlamydia if:  You are sexually active and are younger than 65 years of age.  You are older than 65 years of age and your health care provider tells you that you are at risk for this type of infection.  Your sexual activity has changed since you were last screened and you are at an increased risk for chlamydia or gonorrhea. Ask your health care provider if you are at risk.  If you do not have HIV, but are at risk, it may be recommended that you take a prescription medicine daily to prevent HIV infection. This is called pre-exposure prophylaxis (PrEP). You are considered at risk if:  You are sexually active and do not regularly use  condoms or know the HIV status of your partner(s).  You take drugs by injection.  You are sexually active with a partner who has HIV. Talk with your health care provider about whether you are at high risk of being infected with HIV. If you choose to begin PrEP, you should first be tested for HIV. You should then be tested every 3 months for as long as you are taking PrEP. Pregnancy  If you are premenopausal and you may become pregnant, ask your health care provider about preconception counseling.  If you may become pregnant, take 400 to 800 micrograms (mcg) of folic acid every day.  If you want to prevent pregnancy, talk to your health care provider about birth control (contraception). Osteoporosis and menopause  Osteoporosis is a disease in which the bones lose minerals and strength with aging. This can result in serious bone fractures. Your risk for osteoporosis can be identified using a bone density scan.  If you are 67 years of age or older, or if you are at risk for osteoporosis and fractures, ask your health care provider if you should be screened.  Ask your health care provider whether you should take a calcium or vitamin D supplement to lower your risk for osteoporosis.  Menopause may have certain physical symptoms and risks.  Hormone replacement therapy may reduce some of these symptoms and risks. Talk to your health care provider about whether hormone replacement therapy is right for you. Follow these instructions at home:  Schedule regular health, dental, and eye exams.  Stay current with your immunizations.  Do not use any tobacco products including cigarettes, chewing tobacco, or electronic cigarettes.  If you are pregnant, do not drink alcohol.  If you are breastfeeding, limit how much and how often you drink alcohol.  Limit alcohol intake to no more than 1 drink per day for nonpregnant women. One drink equals 12 ounces of  beer, 5 ounces of wine, or 1 ounces of  hard liquor.  Do not use street drugs.  Do not share needles.  Ask your health care provider for help if you need support or information about quitting drugs.  Tell your health care provider if you often feel depressed.  Tell your health care provider if you have ever been abused or do not feel safe at home. This information is not intended to replace advice given to you by your health care provider. Make sure you discuss any questions you have with your health care provider. Document Released: 07/26/2010 Document Revised: 06/18/2015 Document Reviewed: 10/14/2014  2017 Elsevier

## 2016-01-11 NOTE — Assessment & Plan Note (Signed)
1) Anticipatory Guidance: Discussed importance of wearing a seatbelt while driving and not texting while driving; changing batteries in smoke detector at least once annually; wearing suntan lotion when outside; eating a balanced and moderate diet; getting physical activity at least 30 minutes per day.  2) Immunizations / Screenings / Labs:  Influenza updated today. All other immunizations are up-to-date per recommendations. Due for a cervical cancer screening with referral to gynecology placed. Obtain hepatitis C antibody for hepatitis C screening. Obtain A1c for diabetes screenings. Obtain DEXA for bone mineral density testing. Obtain vitamin D for vitamin D deficiency screening. All other screenings are up-to-date per recommendations. Obtain CBC, CMET, and lipid profile.    Overall well exam with risk factors for cardiovascular disease including previous stroke, hypertension, and hyperlipidemia. Chronic conditions addressed under each separate problem.There is question regarding medication compliance.She exercises infrequently with the goal of increasing physical activity. Continues to ambulate with a cane. Continue other healthy lifestyle behaviors and choices. Follow-up prevention exam in 1 year. Follow-up office visit for chronic conditions.

## 2016-01-11 NOTE — Assessment & Plan Note (Signed)
Blood pressure remains elevated above goal 140/90 with current medication regimen and question patient compliance. Encouraged to monitor blood pressure at home. Continue current dosage of amlodipine, clonidine, furosemide, metoprolol, and losartan-hydrochlorothiazide.

## 2016-01-11 NOTE — Assessment & Plan Note (Signed)
Stroke continues to be monitored through risk factor management with control of hyperlipidemia, hypertension, and diabetes. Chronic conditions remain labile with current medication regimens. Emphasize importance of taking medication as prescribed to help reduce risk for stroke in the future. Continue to monitor.

## 2016-01-12 ENCOUNTER — Other Ambulatory Visit: Payer: Self-pay | Admitting: Family

## 2016-01-12 DIAGNOSIS — E559 Vitamin D deficiency, unspecified: Secondary | ICD-10-CM | POA: Insufficient documentation

## 2016-01-12 MED ORDER — CHOLECALCIFEROL 1.25 MG (50000 UT) PO TABS: 50000.0000 [IU] | ORAL_TABLET | ORAL | 0 refills | Status: DC

## 2016-01-12 MED ORDER — DULAGLUTIDE 0.75 MG/0.5ML ~~LOC~~ SOAJ: 0.7500 mg | SUBCUTANEOUS | 2 refills | Status: DC

## 2016-01-13 ENCOUNTER — Telehealth: Payer: Self-pay

## 2016-01-13 NOTE — Telephone Encounter (Signed)
See result note.  

## 2016-01-13 NOTE — Telephone Encounter (Signed)
Pt wanted to know the results of her dexa scan. Please advise

## 2016-01-15 NOTE — Telephone Encounter (Signed)
Pt aware of the results.

## 2016-02-01 ENCOUNTER — Ambulatory Visit (INDEPENDENT_AMBULATORY_CARE_PROVIDER_SITE_OTHER): Payer: Medicare Other | Admitting: Obstetrics and Gynecology

## 2016-02-01 ENCOUNTER — Encounter: Payer: Self-pay | Admitting: Obstetrics and Gynecology

## 2016-02-01 VITALS — BP 162/80 | HR 60 | Resp 20 | Ht 62.0 in | Wt 135.4 lb

## 2016-02-01 DIAGNOSIS — Z113 Encounter for screening for infections with a predominantly sexual mode of transmission: Secondary | ICD-10-CM

## 2016-02-01 DIAGNOSIS — Z01419 Encounter for gynecological examination (general) (routine) without abnormal findings: Secondary | ICD-10-CM

## 2016-02-01 NOTE — Progress Notes (Signed)
66 y.o. G1P1 Divorced [place] American female here for annual exam.    Status post total abdominal hysterectomy for fibroids? No abnormal pathology.  Done 20 year ago.   Ovaries remain.  Took HRT a long time ago.   Not sexually active for 2 years.  Desires STD testing.  Uses condoms when active.  Did not have dryness or pain.   No bladder issues.   Has constipation.   Has diabetes. HgbA1C - 10.3 weeks ago.  Retired from [employer].  Has one son and no children.   PCP:   [name], FNP  No LMP recorded. Patient has had a hysterectomy.           Sexually active: No. female The current method of family planning is status post hysterectomy.    Exercising: No.   Smoker:  Former, quit [place] Maintenance: Pap:  Years ago History of abnormal Pap:  no MMG:  04-07-15 Density D/neg/BiRads1:TBC Colonoscopy:  09-11-08 hyperplastic polyps with Dr.Gessner;next due 08/2018 BMD:   01-11-16  Result  Normal:[place] TDaP: Td 09-09-08  Gardasil:   N/A HIV: Hep C: Screening Labs:  Hb today: PCP, Urine today: unable to void   reports that she quit smoking about 18 months ago. Her smoking use included Cigarettes. She has a 17.50 pack-year smoking history. She has never used smokeless tobacco. She reports that she does not drink alcohol or use drugs.  Past Medical History:  Diagnosis Date  . Chronic back pain   . Cough   . Diabetes mellitus, type 2 ([place])   . Fibroid    patient thinks this was the reason for her hysterectomy  . GERD (gastroesophageal reflux disease)   . History of sebaceous cyst   . Hyperlipemia   . Hypertension   . Hyperthyroidism   . Lumbar radiculopathy   . Shortness of breath 09/13/2013  . Stroke ([place])   . Tobacco abuse     Past Surgical History:  Procedure Laterality Date  . ABDOMINAL HYSTERECTOMY     --?ovaries remain  . RETINOPATHY SURGERY  2013   Dr. Zigmund Daniel    Current Outpatient Prescriptions  Medication Sig Dispense Refill  . amLODipine  (NORVASC) 10 MG tablet Take 1 tablet (10 mg total) by mouth daily. 90 tablet 3  . aspirin 325 MG tablet Take 325 mg by mouth daily.    [name] atorvastatin (LIPITOR) 80 MG tablet TAKE 1 TABLET EVERY DAY 90 tablet 3  . Cholecalciferol 50000 units TABS Take 50,000 Units by mouth once a week. 8 tablet 0  . cloNIDine (CATAPRES) 0.2 MG tablet Take 1 tablet (0.2 mg total) by mouth 3 (three) times daily. 90 tablet 0  . Dulaglutide (TRULICITY) 0.98 JX/9.1YN SOPN Inject 0.75 mg into the skin once a week. 4 pen 2  . furosemide (LASIX) 40 MG tablet Take 40 mg by mouth daily.    [name] glucose blood (ONETOUCH VERIO) test strip Use as instructed 100 each 12  . losartan-hydrochlorothiazide (HYZAAR) 100-25 MG tablet TAKE 1 TABLET BY MOUTH DAILY. 90 tablet 1  . metFORMIN (GLUCOPHAGE-XR) 750 MG 24 hr tablet TAKE 1 TABLET (750 MG TOTAL) BY MOUTH 2 (TWO) TIMES DAILY. 180 tablet 1  . metoprolol succinate (TOPROL-XL) 100 MG 24 hr tablet Take 1 tablet (100 mg total) by mouth daily. Take with or immediately following a meal. 30 tablet 1  . minoxidil (LONITEN) 2.5 MG tablet Take 2.5 mg by mouth daily.     No current facility-administered medications for this  visit.     Family History  Problem Relation Age of Onset  . Hypertension Mother   . Sleep apnea Mother   . Diabetes Mother   . Hyperlipidemia Mother   . Cancer Father     lung cancer - smoker  . Hypertension Sister   . Diabetes Sister   . Hypertension Brother   . Hypertension Sister   . Diabetes Brother   . Hypertension Brother     ROS:  Pertinent items are noted in HPI.  Otherwise, a comprehensive ROS was negative.  Exam:   BP (!) 162/80 (BP Location: Right Arm, Patient Position: Sitting, Cuff Size: Normal)   Pulse 60   Resp 20   Ht 5\' 2"  (1.575 m)   Wt 135 lb 6.4 oz (61.4 kg)   BMI 24.76 kg/m     General appearance: alert, cooperative and appears stated age Head: Normocephalic, without obvious abnormality, atraumatic Neck: no adenopathy, supple,  symmetrical, trachea midline and thyroid normal to inspection and palpation Lungs: clear to auscultation bilaterally Breasts: normal appearance, no masses or tenderness, No nipple retraction or dimpling, No nipple discharge or bleeding, No axillary or supraclavicular adenopathy Heart: regular rate and rhythm Abdomen: soft, non-tender; no masses, no organomegaly Extremities: extremities normal, atraumatic, no cyanosis or edema Skin: Skin color, texture, turgor normal. No rashes or lesions Lymph nodes: Cervical, supraclavicular, and axillary nodes normal. No abnormal inguinal nodes palpated Neurologic: Grossly normal  Pelvic: External genitalia:  no lesions              Urethra:  normal appearing urethra with no masses, tenderness or lesions              Bartholins and Skenes: normal                 Vagina: normal appearing vagina with normal color and discharge, no lesions              Cervix:  absent              Pap taken: No. Bimanual Exam:  Uterus:   absent              Adnexa: no mass, fullness, tenderness              Rectal exam: Yes.  .  Confirms.              Anus:  normal sphincter tone, no lesions  Chaperone was present for exam.  Assessment:   Well woman visit with normal exam. Status post TAH.  Ovaries remain.  Desire for STD testing.  Dense breast tissue. Uncontrolled DM.  Plan: Mammogram screening discussed.  Discussed 3D. Recommended self breast awareness. Pap and HR HPV as above. Guidelines for Calcium, Vitamin D, regular exercise program including cardiovascular and weight bearing exercise. STD testing.  I really encouraged joining the [facility] program.  Follow up annually and prn.       After visit summary provided.

## 2016-02-01 NOTE — Patient Instructions (Signed)

## 2016-02-02 LAB — HEPATITIS C ANTIBODY: HCV AB: NEGATIVE

## 2016-02-02 LAB — GC/CHLAMYDIA PROBE AMP
CT Probe RNA: NOT DETECTED
GC Probe RNA: NOT DETECTED

## 2016-02-02 LAB — STD PANEL
HEP B S AG: NEGATIVE
HIV: NONREACTIVE

## 2016-02-24 ENCOUNTER — Encounter (HOSPITAL_COMMUNITY): Payer: Self-pay

## 2016-02-24 ENCOUNTER — Emergency Department (HOSPITAL_COMMUNITY)
Admission: EM | Admit: 2016-02-24 | Discharge: 2016-02-24 | Disposition: A | Discharge status: Home / Self Care | Payer: Medicare Other | Attending: Emergency Medicine | Admitting: Emergency Medicine

## 2016-02-24 DIAGNOSIS — Z7982 Long term (current) use of aspirin: Secondary | ICD-10-CM | POA: Insufficient documentation

## 2016-02-24 DIAGNOSIS — I10 Essential (primary) hypertension: Secondary | ICD-10-CM | POA: Diagnosis not present

## 2016-02-24 DIAGNOSIS — Z79899 Other long term (current) drug therapy: Secondary | ICD-10-CM | POA: Insufficient documentation

## 2016-02-24 DIAGNOSIS — Z87891 Personal history of nicotine dependence: Secondary | ICD-10-CM | POA: Insufficient documentation

## 2016-02-24 DIAGNOSIS — I252 Old myocardial infarction: Secondary | ICD-10-CM | POA: Diagnosis not present

## 2016-02-24 DIAGNOSIS — K0889 Other specified disorders of teeth and supporting structures: Secondary | ICD-10-CM | POA: Diagnosis present

## 2016-02-24 DIAGNOSIS — K047 Periapical abscess without sinus: Secondary | ICD-10-CM | POA: Insufficient documentation

## 2016-02-24 DIAGNOSIS — Z7984 Long term (current) use of oral hypoglycemic drugs: Secondary | ICD-10-CM | POA: Insufficient documentation

## 2016-02-24 DIAGNOSIS — Z8673 Personal history of transient ischemic attack (TIA), and cerebral infarction without residual deficits: Secondary | ICD-10-CM | POA: Diagnosis not present

## 2016-02-24 DIAGNOSIS — E119 Type 2 diabetes mellitus without complications: Secondary | ICD-10-CM | POA: Diagnosis not present

## 2016-02-24 MED ORDER — CLINDAMYCIN HCL 150 MG PO CAPS: 150.0000 mg | ORAL_CAPSULE | Freq: Four times a day (QID) | ORAL | 0 refills | Status: DC

## 2016-02-24 MED ORDER — TRAMADOL HCL 50 MG PO TABS: 50.0000 mg | ORAL_TABLET | Freq: Four times a day (QID) | ORAL | 0 refills | Status: DC | PRN

## 2016-02-24 NOTE — ED Triage Notes (Signed)
Patient came in today due  To dental pain started Friday. Patient state "she is unable to put her dentures in.  Her left cheek is swollen on assessment.

## 2016-02-24 NOTE — ED Provider Notes (Signed)
[facility] DEPT Provider Note   CSN: [id number] Arrival date & time: 02/24/16  2142     History   Chief Complaint Chief Complaint  Patient presents with  . Dental Pain    HPI MAIRIM BADE is a 66 y.o. female.  HPI   66 year old female with history of diabetes, chronic back pain, HTN, prior stroke presenting complaining of dental pain. Patient reports for the past 4-5 days she has progressive worsening pain to the left upper tooth and gum with associated facial swelling. Pain is as a throbbing sensation, persistent, worsening with chewing. No associated fever, or chills. No trouble swallowing. No neck pain. No specific treatment tried. She does not have a dentist. Patient states she is having difficulty putting a partial dentures because of her swallowing gum. She denies any eye pain.  Past Medical History:  Diagnosis Date  . Chronic back pain   . Cough   . Diabetes mellitus, type 2 ([place])   . Fibroid    patient thinks this was the reason for her hysterectomy  . GERD (gastroesophageal reflux disease)   . History of sebaceous cyst   . Hyperlipemia   . Hypertension   . Hyperthyroidism   . Lumbar radiculopathy   . Shortness of breath 09/13/2013  . Stroke ([place])   . Tobacco abuse     Patient Active Problem List   Diagnosis Date Noted  . Vitamin D deficiency 01/12/2016  . Medicare annual wellness visit, initial 01/11/2016  . Lacunar infarct, acute ([place]) 03/23/2015  . Non compliance w medication regimen 01/16/2015  . Anemia 01/08/2015  . Acute ischemic stroke ([place]) 01/08/2015  . Acute kidney injury ([place]) 01/08/2015  . Stroke ([place]) 01/08/2015  . Hypertension   . Hyperlipemia   . Loss of weight 10/07/2014  . Facial swelling 07/04/2014  . Chest pain 09/13/2013  . Dyspnea 09/13/2013  . Malignant hypertension 09/13/2013  . Carotid bruit present 12/13/2012  . Routine health maintenance 10/24/2010  . PERIPHERAL EDEMA 09/10/2009  . COUGH 04/03/2009  .  ELECTROCARDIOGRAM, ABNORMAL 04/11/2008  . HEART MURMUR, SYSTOLIC 44/81/8563  . SEBACEOUS CYST, NECK 12/13/2006  . Hyperthyroidism 09/27/2006  . Secondary diabetes with peripheral vascular disease ([place]) 09/27/2006  . Hyperlipidemia LDL goal <70 09/27/2006  . TOBACCO ABUSE 09/27/2006  . Essential hypertension 09/27/2006    Past Surgical History:  Procedure Laterality Date  . ABDOMINAL HYSTERECTOMY     --?ovaries remain  . RETINOPATHY SURGERY  2013   Dr. Zigmund Daniel    OB History    Gravida Para Term Preterm AB Living   1 1       1    SAB TAB Ectopic Multiple Live Births                   Home Medications    Prior to Admission medications   Medication Sig Start Date End Date Taking? Authorizing Provider  amLODipine (NORVASC) 10 MG tablet Take 1 tablet (10 mg total) by mouth daily. 03/26/15   Golden Circle, FNP  aspirin 325 MG tablet Take 325 mg by mouth daily.    Historical Provider, MD  atorvastatin (LIPITOR) 80 MG tablet TAKE 1 TABLET EVERY DAY 04/09/15   Golden Circle, FNP  Cholecalciferol 50000 units TABS Take 50,000 Units by mouth once a week. 01/12/16   Golden Circle, FNP  cloNIDine (CATAPRES) 0.2 MG tablet Take 1 tablet (0.2 mg total) by mouth 3 (three) times daily. 01/15/15   Golden Circle, FNP  Dulaglutide (  TRULICITY) 6.06 TK/1.6WF SOPN Inject 0.75 mg into the skin once a week. 01/12/16   Golden Circle, FNP  furosemide (LASIX) 40 MG tablet Take 40 mg by mouth daily.    Historical Provider, MD  glucose blood (ONETOUCH VERIO) test strip Use as instructed 02/09/15   Golden Circle, FNP  losartan-hydrochlorothiazide (HYZAAR) 100-25 MG tablet TAKE 1 TABLET BY MOUTH DAILY. 01/07/16   Golden Circle, FNP  metFORMIN (GLUCOPHAGE-XR) 750 MG 24 hr tablet TAKE 1 TABLET (750 MG TOTAL) BY MOUTH 2 (TWO) TIMES DAILY. 09/30/15   Golden Circle, FNP  metoprolol succinate (TOPROL-XL) 100 MG 24 hr tablet Take 1 tablet (100 mg total) by mouth daily. Take with or immediately  following a meal. 01/15/15   Golden Circle, FNP  minoxidil (LONITEN) 2.5 MG tablet Take 2.5 mg by mouth daily.    Historical Provider, MD    Family History Family History  Problem Relation Age of Onset  . Hypertension Mother   . Sleep apnea Mother   . Diabetes Mother   . Hyperlipidemia Mother   . Cancer Father     lung cancer - smoker  . Hypertension Sister   . Diabetes Sister   . Hypertension Brother   . Hypertension Sister   . Diabetes Brother   . Hypertension Brother     Social History Social History  Substance Use Topics  . Smoking status: Former Smoker    Packs/day: 0.50    Years: 35.00    Types: Cigarettes    Quit date: 07/07/2014  . Smokeless tobacco: Never Used     Comment: patient advised to keep diary, develop strategy and stop  . Alcohol use No     Allergies   Patient has no known allergies.   Review of Systems Review of Systems  Constitutional: Negative for fever.  HENT: Positive for dental problem.   Respiratory: Negative for shortness of breath.   Cardiovascular: Negative for chest pain.  Musculoskeletal: Negative for neck pain.  Neurological: Negative for headaches.     Physical Exam Updated Vital Signs BP 150/67 (BP Location: Right Arm)   Pulse 64   Temp 98.3 F (36.8 C) (Oral)   Resp 16   Ht 5\' 5"  (1.651 m)   Wt 61.2 kg   SpO2 100%   BMI 22.47 kg/m   Physical Exam  Constitutional: She appears well-developed and well-nourished. No distress.  HENT:  Head: Atraumatic.  Mouth: Multiple dental caries, tenderness to tooth #9 with surrounding gingival edema but no trismus. No obvious abscess amenable for drainage.  Associated facial swelling without evidence of orbital cellulitis or preseptal cellulitis  Eyes: Conjunctivae are normal.  Neck: Neck supple.  Lymphadenopathy:    She has no cervical adenopathy.  Neurological: She is alert.  Skin: No rash noted.  Psychiatric: She has a normal mood and affect.  Nursing note and vitals  reviewed.    ED Treatments / Results  Labs (all labs ordered are listed, but only abnormal results are displayed) Labs Reviewed - No data to display  EKG  EKG Interpretation None       Radiology No results found.  Procedures Procedures (including critical care time)  Medications Ordered in ED Medications - No data to display   Initial Impression / Assessment and Plan / ED Course  I have reviewed the triage vital signs and the nursing notes.  Pertinent labs & imaging results that were available during my care of the patient were reviewed by me  and considered in my medical decision making (see chart for details).     BP 150/67 (BP Location: Right Arm)   Pulse 64   Temp 98.3 F (36.8 C) (Oral)   Resp 16   Ht 5\' 5"  (1.651 m)   Wt 61.2 kg   SpO2 100%   BMI 22.47 kg/m    Final Clinical Impressions(s) / ED Diagnoses   Final diagnoses:  Periapical abscess with facial involvement    New Prescriptions New Prescriptions   CLINDAMYCIN (CLEOCIN) 150 MG CAPSULE    Take 1 capsule (150 mg total) by mouth every 6 (six) hours.   TRAMADOL (ULTRAM) 50 MG TABLET    Take 1 tablet (50 mg total) by mouth every 6 (six) hours as needed.   10:38 PM Pt with reproducible dental pain and gingival swelling suggestive of dental infection, likely periapical abscess.  Doubt cardiac etiology.  Will prescribe abx and pain medication with dental referral.  Return precaution given.    Domenic Moras, PA-C 02/24/16 2241    Duffy Bruce, MD 02/26/16 1520

## 2016-03-09 ENCOUNTER — Other Ambulatory Visit: Payer: Self-pay | Admitting: Family

## 2016-03-09 DIAGNOSIS — Z1231 Encounter for screening mammogram for malignant neoplasm of breast: Secondary | ICD-10-CM

## 2016-03-18 ENCOUNTER — Other Ambulatory Visit: Payer: Self-pay | Admitting: Family

## 2016-03-23 ENCOUNTER — Other Ambulatory Visit: Payer: Self-pay | Admitting: Family

## 2016-03-23 ENCOUNTER — Ambulatory Visit (INDEPENDENT_AMBULATORY_CARE_PROVIDER_SITE_OTHER): Payer: Medicare Other | Admitting: Nurse Practitioner

## 2016-03-23 ENCOUNTER — Encounter: Payer: Self-pay | Admitting: Nurse Practitioner

## 2016-03-23 VITALS — BP 160/80 | HR 72 | Ht 65.0 in | Wt 135.2 lb

## 2016-03-23 DIAGNOSIS — E785 Hyperlipidemia, unspecified: Secondary | ICD-10-CM | POA: Diagnosis not present

## 2016-03-23 DIAGNOSIS — I639 Cerebral infarction, unspecified: Secondary | ICD-10-CM

## 2016-03-23 DIAGNOSIS — I10 Essential (primary) hypertension: Secondary | ICD-10-CM | POA: Diagnosis not present

## 2016-03-23 NOTE — Progress Notes (Signed)
[facility]  PATIENT: [name] DOB: 1950/03/11   REASON FOR VISIT: Follow-up for history of stroke December 2016 HISTORY FROM: Patient    HISTORY OF PRESENT ILLNESS:UPDATE 03/23/16 CM [name], 66 year old female returns for follow-up with a history of stroke which occurred in December 2016. Her risk factors are uncontrolled hypertension hyperlipidemia and diabetes and kidney disease. She is currently on aspirin 325 daily for secondary stroke prevention and she has not had recurrent stroke or TIA symptoms since December 2016. She has no bruising or bleeding. Blood pressure noted to be elevated in the office today 160/80 however patient claims she takes her blood pressure at home and it is  within normal limits. She is on Lipitor for hyper lipidemia. She denies any myalgias. She is due to get labs this Friday . Reviewed labs from December 2017 She returns for reevaluation   UPDATE 08/28/2017CM. [name], 66 year old female returns for follow-up. She has a history of stroke in December 2016 with risk factors of uncontrolled hypertension hyperlipidemia and uncontrolled diabetes. She is currently on aspirin 325 daily without further stroke or TIA symptoms. She is on Lipitor daily for secondary stroke prevention as well. Most recent hemoglobin A1c 9.4 on 08/28/2015. She is on Glucophage. She has been told to limit her sugar. She gets no regular exercise. She claims she does her home exercise program established by physical therapy. She returns for reevaluation. She is ambulating with a cane and denies any recent falls  REVIEW OF SYSTEMS: Full 14 system review of systems performed and notable only for those listed, all others are neg:  Constitutional: neg  Cardiovascular: neg Ear/Nose/Throat: neg  Skin: neg Eyes: neg Respiratory: neg Gastroitestinal: neg  Hematology/Lymphatic: neg  Endocrine: neg Musculoskeletal:neg Allergy/Immunology: neg Neurological:  neg Psychiatric: neg Sleep : neg   ALLERGIES: No Known Allergies  HOME MEDICATIONS: Outpatient Medications Prior to Visit  Medication Sig Dispense Refill  . amLODipine (NORVASC) 10 MG tablet Take 1 tablet (10 mg total) by mouth daily. 90 tablet 3  . aspirin 325 MG tablet Take 325 mg by mouth daily.    [name] atorvastatin (LIPITOR) 80 MG tablet TAKE 1 TABLET EVERY DAY 90 tablet 3  . cloNIDine (CATAPRES) 0.2 MG tablet Take 1 tablet (0.2 mg total) by mouth 3 (three) times daily. 90 tablet 0  . furosemide (LASIX) 40 MG tablet Take 40 mg by mouth daily.    [name] losartan-hydrochlorothiazide (HYZAAR) 100-25 MG tablet TAKE 1 TABLET BY MOUTH DAILY. 90 tablet 1  . metFORMIN (GLUCOPHAGE-XR) 750 MG 24 hr tablet TAKE 1 TABLET (750 MG TOTAL) BY MOUTH 2 (TWO) TIMES DAILY. 180 tablet 1  . metoprolol succinate (TOPROL-XL) 100 MG 24 hr tablet Take 1 tablet (100 mg total) by mouth daily. Take with or immediately following a meal. 30 tablet 1  . minoxidil (LONITEN) 2.5 MG tablet Take 2.5 mg by mouth daily.    [name] test strip USE AS INSTRUCTED (TESTS 2 TIMES A DAY) 100 each 7  . Cholecalciferol 50000 units TABS Take 50,000 Units by mouth once a week. (Patient not taking: Reported on 03/23/2016) 8 tablet 0  . clindamycin (CLEOCIN) 150 MG capsule Take 1 capsule (150 mg total) by mouth every 6 (six) hours. (Patient not taking: Reported on 03/23/2016) 28 capsule 0  . Dulaglutide (TRULICITY) 5.27 PO/2.4MP SOPN Inject 0.75 mg into the skin once a week. (Patient not taking: Reported on 03/23/2016) 4 pen 2  . traMADol (ULTRAM) 50 MG tablet Take  1 tablet (50 mg total) by mouth every 6 (six) hours as needed. (Patient not taking: Reported on 03/23/2016) 15 tablet 0   No facility-administered medications prior to visit.     PAST MEDICAL HISTORY: Past Medical History:  Diagnosis Date  . Chronic back pain   . Cough   . Diabetes mellitus, type 2 ([place])   . Fibroid    patient thinks this was the reason for her  hysterectomy  . GERD (gastroesophageal reflux disease)   . History of sebaceous cyst   . Hyperlipemia   . Hypertension   . Hyperthyroidism   . Lumbar radiculopathy   . Shortness of breath 09/13/2013  . Stroke ([place])   . Tobacco abuse     PAST SURGICAL HISTORY: Past Surgical History:  Procedure Laterality Date  . ABDOMINAL HYSTERECTOMY     --?ovaries remain  . RETINOPATHY SURGERY  2013   Dr. Zigmund Daniel    FAMILY HISTORY: Family History  Problem Relation Age of Onset  . Hypertension Mother   . Sleep apnea Mother   . Diabetes Mother   . Hyperlipidemia Mother   . Cancer Father     lung cancer - smoker  . Hypertension Sister   . Diabetes Sister   . Hypertension Brother   . Hypertension Sister   . Diabetes Brother   . Hypertension Brother     SOCIAL HISTORY: Social History   Social History  . Marital status: Divorced    Spouse name: N/A  . Number of children: 1  . Years of education: 31   Occupational History  . [employer]   Social History Main Topics  . Smoking status: Former Smoker    Packs/day: 0.50    Years: 35.00    Types: Cigarettes    Quit date: 07/07/2014  . Smokeless tobacco: Never Used     Comment: patient advised to keep diary, develop strategy and stop  . Alcohol use No  . Drug use: No  . Sexual activity: No     Comment: TAH--?ovaries remain   Other Topics Concern  . Not on file   Social History Narrative   Denies abuse and feels safe at home.     PHYSICAL EXAM  Vitals:   03/23/16 0858  BP: (!) 160/80  Pulse: 72  Weight: 135 lb 3.2 oz (61.3 kg)  Height: 5\' 5"  (1.651 m)   Body mass index is 22.5 kg/m. General: well developed, well nourished  seated, in no evident distress Head: head normocephalic and atraumatic.  Neck: supple with no carotid  bruits Cardiovascular: regular rate and rhythm, no murmurs Musculoskeletal: no deformity Skin:  no rash/petichiae   Neurological examination  Mental Status: Awake  and fully alert. Oriented to place and time. Recent and remote memory intact. Attention span, concentration and fund of knowledge appropriate. Mood and affect appropriate.  Cranial Nerves:  Pupils equal, briskly reactive to light. Extraocular movements full without nystagmus. Visual fields full to confrontation. Hearing intact. Facial sensation intact. Face, tongue, palate moves normally and symmetrically.  Motor: Normal bulk and tone. Normal strength in all tested extremity muscles. Sensory.: Diminished touch ,pinprick .position and vibratory sensation, on the right side and normal on the left  Coordination: Rapid alternating movements normal in all extremities. Finger-to-nose and heel-to-shin performed accurately bilaterally. Gait and Station: Arises from chair without difficulty. Stance is normal. Gait demonstrates normal stride length and balance . Able to heel, toe and tandem walk without difficulty. No assistive device  Reflexes: Symmetric  upper and lower, Toes downgoing.  DIAGNOSTIC DATA (LABS, IMAGING, TESTING) - I reviewed patient records, labs, notes, testing and imaging myself where available.  Lab Results  Component Value Date   WBC 6.1 01/11/2016   HGB 10.4 (L) 01/11/2016   HCT 31.5 (L) 01/11/2016   MCV 92.5 01/11/2016   PLT 244.0 01/11/2016      Component Value Date/Time   NA 133 (L) 01/11/2016 0948   K 3.7 01/11/2016 0948   CL 96 01/11/2016 0948   CO2 29 01/11/2016 0948   GLUCOSE 242 (H) 01/11/2016 0948   BUN 31 (H) 01/11/2016 0948   CREATININE 1.55 (H) 01/11/2016 0948   CALCIUM 8.9 01/11/2016 0948   PROT 6.4 01/11/2016 0948   ALBUMIN 3.4 (L) 01/11/2016 0948   AST 19 01/11/2016 0948   ALT 28 01/11/2016 0948   ALKPHOS 85 01/11/2016 0948   BILITOT 1.0 01/11/2016 0948   GFRNONAA 38 (L) 01/10/2015 0225   GFRAA 45 (L) 01/10/2015 0225   Lab Results  Component Value Date   CHOL 141 01/11/2016   HDL 38.40 (L) 01/11/2016   LDLCALC 84 01/11/2016   LDLDIRECT 130.4  12/13/2012   TRIG 95.0 01/11/2016   CHOLHDL 4 01/11/2016   Lab Results  Component Value Date   HGBA1C 10.0 (H) 01/11/2016       ASSESSMENT AND PLAN 66 year With 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 She returns for follow-up.   PLAN: Continued control of stroke risk factors Continue aspirin 325 mg daily  for secondary stroke prevention  Strict control of hypertension with blood pressure goal below 130/90, today's reading 160/80 continue blood pressure medications  diabetes with hemoglobin A1c goal below 6.5% continue diabetic meds Lipids with LDL cholesterol goal below 70 mg/dL continue Lipitor.  Eat a healthy diet with plenty of whole grains, cereals, fruits and vegetables, limit sugar and salt  exercise regularly and maintain ideal body weight Continue follow-up with primary care provider Discharge from neurologic clinic REMEMBER Pneumonic FAST which stands for  F stands  for face drooping and weakness etc. A stands for arms, weakness S stands for speech slurred  T stands for time to call 911 Greater than 50% of time during this 20 minute visit was spent on counseling,explanation and education of diagnosis, and importance of continued stroke risk factor monitoring.Rayburn Ma, [facility], APRN  [facility] [address] [place], [place] [address] [phone]

## 2016-03-23 NOTE — Patient Instructions (Signed)
Continue aspirin 325 mg daily  for secondary stroke prevention  Strict control of hypertension with blood pressure goal below 130/90, today's reading 160/80 continue blood pressure medications  diabetes with hemoglobin A1c goal below 6.5% continue diabetic meds Lipids with LDL cholesterol goal below 70 mg/dL continue Lipitor.  Eat a healthy diet with plenty of whole grains, cereals, fruits and vegetables, limit sugar and salt  exercise regularly and maintain ideal body weight Discharge from neurologic clinic REMEMBER Pneumonic FAST which stands for  F stands  for face drooping and weakness etc. A stands for arms, weakness S stands for speech slurred  T stands for time to call 911

## 2016-03-25 DIAGNOSIS — E119 Type 2 diabetes mellitus without complications: Secondary | ICD-10-CM | POA: Diagnosis not present

## 2016-03-25 DIAGNOSIS — I10 Essential (primary) hypertension: Secondary | ICD-10-CM | POA: Diagnosis not present

## 2016-03-25 DIAGNOSIS — N183 Chronic kidney disease, stage 3 (moderate): Secondary | ICD-10-CM | POA: Diagnosis not present

## 2016-03-25 NOTE — Progress Notes (Signed)
I agree with the above plan 

## 2016-04-07 ENCOUNTER — Ambulatory Visit
Admission: RE | Admit: 2016-04-07 | Discharge: 2016-04-07 | Disposition: A | Discharge status: Home / Self Care | Payer: Medicare Other | Source: Ambulatory Visit | Attending: Family | Admitting: Family

## 2016-04-07 DIAGNOSIS — Z1231 Encounter for screening mammogram for malignant neoplasm of breast: Secondary | ICD-10-CM | POA: Diagnosis not present

## 2016-05-05 DIAGNOSIS — E119 Type 2 diabetes mellitus without complications: Secondary | ICD-10-CM | POA: Diagnosis not present

## 2016-05-05 DIAGNOSIS — I10 Essential (primary) hypertension: Secondary | ICD-10-CM | POA: Diagnosis not present

## 2016-05-05 DIAGNOSIS — N2581 Secondary hyperparathyroidism of renal origin: Secondary | ICD-10-CM | POA: Diagnosis not present

## 2016-05-05 DIAGNOSIS — N183 Chronic kidney disease, stage 3 (moderate): Secondary | ICD-10-CM | POA: Diagnosis not present

## 2016-05-09 ENCOUNTER — Ambulatory Visit (INDEPENDENT_AMBULATORY_CARE_PROVIDER_SITE_OTHER): Payer: Medicare Other | Admitting: Ophthalmology

## 2016-05-09 DIAGNOSIS — H43813 Vitreous degeneration, bilateral: Secondary | ICD-10-CM | POA: Diagnosis not present

## 2016-05-09 DIAGNOSIS — E11319 Type 2 diabetes mellitus with unspecified diabetic retinopathy without macular edema: Secondary | ICD-10-CM

## 2016-05-09 DIAGNOSIS — H35033 Hypertensive retinopathy, bilateral: Secondary | ICD-10-CM

## 2016-05-09 DIAGNOSIS — E113593 Type 2 diabetes mellitus with proliferative diabetic retinopathy without macular edema, bilateral: Secondary | ICD-10-CM | POA: Diagnosis not present

## 2016-05-09 DIAGNOSIS — I10 Essential (primary) hypertension: Secondary | ICD-10-CM | POA: Diagnosis not present

## 2016-05-12 ENCOUNTER — Encounter: Payer: Self-pay | Admitting: Family

## 2016-05-12 ENCOUNTER — Encounter: Payer: Medicare Other | Admitting: Family

## 2016-05-13 NOTE — Progress Notes (Signed)
Error

## 2016-06-07 DIAGNOSIS — E119 Type 2 diabetes mellitus without complications: Secondary | ICD-10-CM | POA: Diagnosis not present

## 2016-06-07 DIAGNOSIS — N2581 Secondary hyperparathyroidism of renal origin: Secondary | ICD-10-CM | POA: Diagnosis not present

## 2016-06-07 DIAGNOSIS — I10 Essential (primary) hypertension: Secondary | ICD-10-CM | POA: Diagnosis not present

## 2016-06-07 DIAGNOSIS — N183 Chronic kidney disease, stage 3 (moderate): Secondary | ICD-10-CM | POA: Diagnosis not present

## 2016-06-13 ENCOUNTER — Other Ambulatory Visit (INDEPENDENT_AMBULATORY_CARE_PROVIDER_SITE_OTHER): Payer: Medicare Other

## 2016-06-13 ENCOUNTER — Ambulatory Visit (INDEPENDENT_AMBULATORY_CARE_PROVIDER_SITE_OTHER): Payer: Medicare Other | Admitting: Family

## 2016-06-13 ENCOUNTER — Encounter: Payer: Self-pay | Admitting: Family

## 2016-06-13 VITALS — BP 170/80 | HR 65 | Temp 98.4°F | Resp 16 | Ht 65.0 in | Wt 133.8 lb

## 2016-06-13 DIAGNOSIS — N183 Chronic kidney disease, stage 3 unspecified: Secondary | ICD-10-CM

## 2016-06-13 DIAGNOSIS — E1122 Type 2 diabetes mellitus with diabetic chronic kidney disease: Secondary | ICD-10-CM

## 2016-06-13 DIAGNOSIS — I10 Essential (primary) hypertension: Secondary | ICD-10-CM

## 2016-06-13 DIAGNOSIS — E119 Type 2 diabetes mellitus without complications: Secondary | ICD-10-CM | POA: Insufficient documentation

## 2016-06-13 DIAGNOSIS — E113599 Type 2 diabetes mellitus with proliferative diabetic retinopathy without macular edema, unspecified eye: Secondary | ICD-10-CM | POA: Insufficient documentation

## 2016-06-13 LAB — HEMOGLOBIN A1C: HEMOGLOBIN A1C: 9.3 % — AB (ref 4.6–6.5)

## 2016-06-13 MED ORDER — DULAGLUTIDE 0.75 MG/0.5ML ~~LOC~~ SOAJ: 0.7500 mg | SUBCUTANEOUS | 1 refills | Status: DC

## 2016-06-13 NOTE — Patient Instructions (Signed)
Thank you for choosing [facility].  SUMMARY AND INSTRUCTIONS:  Please continue to take your blood pressure medications as prescribed and follow up with Dr. Lorrene Reid.   Continue to monitor your blood pressure at home and follow low sodium diet.    Stop the Metformin. Start Trulicity. May need to add Insulin if we are unable to control with Trulicity.  Follow up with blood sugars in 2 weeks after starting the Trulicity.   Medication:  Your prescription(s) have been submitted to your pharmacy or been printed and provided for you. Please take as directed and contact our office if you believe you are having problem(s) with the medication(s) or have any questions.  Labs:  Please stop by the lab on the lower level of the building for your blood work. Your results will be released to [facility] (or called to you) after review, usually within 72 hours after test completion. If any changes need to be made, you will be notified at that same time.  1.) The lab is open from 7:30am to 5:30 pm Monday-Friday 2.) No appointment is necessary 3.) Fasting (if needed) is 6-8 hours after food and drink; black coffee and water are okay   Follow up:  If your symptoms worsen or fail to improve, please contact our office for further instruction, or in case of emergency go directly to the emergency room at the closest medical facility.

## 2016-06-13 NOTE — Assessment & Plan Note (Signed)
Blood pressure remains poorly controlled with current medication regimen and no adverse side effects. Question patient compliance with medication regimen that she has had improvements when taken the medications. Denies worst headache of life. Continue management per nephrology. Continue current dosage of amlodipine, furosemide, losartan-hydrochlorothiazide, minoxidil, and clonidine.

## 2016-06-13 NOTE — Assessment & Plan Note (Signed)
Obtain hemoglobin A1c. Previously remains uncontrolled with questionable patient compliance. Given decreasing kidney function will discontinue Metformin and start Trulicity. If unable to control blood sugars with Trulicity consider adding insulin. Emphasized importance of decreasing blood sugars to reduce risk for end organ damage in the future. Follow up in 2 weeks with blood sugar numbers.

## 2016-06-13 NOTE — Progress Notes (Signed)
Subjective:    Patient ID: [name], female    DOB: Feb 12, 1950, 66 y.o.   MRN: [id number]  Chief Complaint  Patient presents with  . Diabetes check    check sugars, kidney function has gotten worse per her kidney doctor that she saw last week, Dr. Jamal Maes     HPI:  [name] is a 66 y.o. female who  has a past medical history of Chronic back pain; Cough; Diabetes mellitus, type 2 ([place]); Fibroid; GERD (gastroesophageal reflux disease); History of sebaceous cyst; Hyperlipemia; Hypertension; Hyperthyroidism; Lumbar radiculopathy; Shortness of breath (09/13/2013); Stroke [facility]); and Tobacco abuse. and presents today for a follow-up office visit.    1.) Hypertension - Currently maintained on amlodipine, minoxidil losartan-hydrochlorothiazide, and metoprolol. Reports taking the medication as prescribed and denies adverse side effects. Blood pressures have continued to remain elevated above goal of 130/80. She is followed by both cardiology and nephrology.   BP Readings from Last 3 Encounters:  06/13/16 (!) 170/80  03/23/16 (!) 160/80  02/24/16 150/67    2.) Type 2 diabetes - Currently maintained on metformin. Reports taking the medication as prescribed and denies adverse side effects. Most recent A1c is 10.0. Blood sugar at home has been 160-190s. Denies excessive hunger, thirst or urination. Denies changes in vision.   Lab Results  Component Value Date   HGBA1C 9.3 (H) 06/13/2016    No Known Allergies   Outpatient Medications Prior to Visit  Medication Sig Dispense Refill  . amLODipine (NORVASC) 10 MG tablet Take 1 tablet (10 mg total) by mouth daily. 90 tablet 3  . aspirin 325 MG tablet Take 325 mg by mouth daily.    [name] atorvastatin (LIPITOR) 80 MG tablet TAKE 1 TABLET EVERY DAY 90 tablet 3  . cloNIDine (CATAPRES) 0.2 MG tablet Take 1 tablet (0.2 mg total) by mouth 3 (three) times daily. 90 tablet 0  . furosemide (LASIX) 40 MG tablet Take 40 mg by mouth daily.     [name] losartan-hydrochlorothiazide (HYZAAR) 100-25 MG tablet TAKE 1 TABLET BY MOUTH DAILY. 90 tablet 1  . metFORMIN (GLUCOPHAGE-XR) 750 MG 24 hr tablet TAKE 1 TABLET (750 MG TOTAL) BY MOUTH 2 (TWO) TIMES DAILY. 180 tablet 1  . metoprolol succinate (TOPROL-XL) 100 MG 24 hr tablet Take 1 tablet (100 mg total) by mouth daily. Take with or immediately following a meal. 30 tablet 1  . minoxidil (LONITEN) 2.5 MG tablet Take 2.5 mg by mouth daily.    [name] test strip USE AS INSTRUCTED (TESTS 2 TIMES A DAY) 100 each 7   No facility-administered medications prior to visit.       Past Surgical History:  Procedure Laterality Date  . ABDOMINAL HYSTERECTOMY     --?ovaries remain  . RETINOPATHY SURGERY  2013   Dr. Zigmund Daniel      Past Medical History:  Diagnosis Date  . Chronic back pain   . Cough   . Diabetes mellitus, type 2 ([place])   . Fibroid    patient thinks this was the reason for her hysterectomy  . GERD (gastroesophageal reflux disease)   . History of sebaceous cyst   . Hyperlipemia   . Hypertension   . Hyperthyroidism   . Lumbar radiculopathy   . Shortness of breath 09/13/2013  . Stroke ([place])   . Tobacco abuse       Review of Systems  Constitutional: Negative for chills, fatigue and fever.  Eyes:  Denies changes in vision  Respiratory: Negative for cough, chest tightness, shortness of breath and wheezing.   Cardiovascular: Negative for chest pain, palpitations and leg swelling.  Endocrine: Negative for polydipsia, polyphagia and polyuria.  Neurological: Negative for dizziness, weakness, light-headedness and numbness.      Objective:    BP (!) 170/80 (BP Location: Left Arm, Patient Position: Sitting, Cuff Size: Normal)   Pulse 65   Temp 98.4 F (36.9 C) (Oral)   Resp 16   Ht 5\' 5"  (1.651 m)   Wt 133 lb 12.8 oz (60.7 kg)   SpO2 96%   BMI 22.27 kg/m  Nursing note and vital signs reviewed.  Physical Exam  Constitutional: She is oriented to person,  place, and time. She appears well-developed and well-nourished. No distress.  Cardiovascular: Normal rate, regular rhythm, normal heart sounds and intact distal pulses.   Pulmonary/Chest: Effort normal and breath sounds normal.  Neurological: She is alert and oriented to person, place, and time.  Skin: Skin is warm and dry.  Psychiatric: She has a normal mood and affect. Her behavior is normal. Judgment and thought content normal.       Assessment & Plan:   Problem List Items Addressed This Visit      Cardiovascular and Mediastinum   Essential hypertension - Primary    Blood pressure remains poorly controlled with current medication regimen and no adverse side effects. Question patient compliance with medication regimen that she has had improvements when taken the medications. Denies worst headache of life. Continue management per nephrology. Continue current dosage of amlodipine, furosemide, losartan-hydrochlorothiazide, minoxidil, and clonidine.        Endocrine   Type 2 diabetes mellitus (HCC)    Obtain hemoglobin A1c. Previously remains uncontrolled with questionable patient compliance. Given decreasing kidney function will discontinue Metformin and start Trulicity. If unable to control blood sugars with Trulicity consider adding insulin. Emphasized importance of decreasing blood sugars to reduce risk for end organ damage in the future. Follow up in 2 weeks with blood sugar numbers.       Relevant Medications   Dulaglutide (TRULICITY) 8.18 HU/3.1SH SOPN   Other Relevant Orders   Hemoglobin A1c (Completed)       I am having Ms. Crilly start on Dulaglutide. I am also having her maintain her aspirin, minoxidil, cloNIDine, metoprolol succinate, amLODipine, atorvastatin, furosemide, losartan-hydrochlorothiazide, ONETOUCH VERIO, and metFORMIN.   Meds ordered this encounter  Medications  . Dulaglutide (TRULICITY) 7.02 OV/7.8HY SOPN    Sig: Inject 0.75 mg into the skin once a  week.    Dispense:  4 pen    Refill:  1    Order Specific Question:   Supervising Provider    Answer:   Pricilla Holm A [[id number]]     Follow-up: Return in about 2 weeks (around 06/27/2016), or if symptoms worsen or fail to improve.  [name], FNP

## 2016-06-14 ENCOUNTER — Telehealth: Payer: Self-pay | Admitting: Family

## 2016-06-14 DIAGNOSIS — E1122 Type 2 diabetes mellitus with diabetic chronic kidney disease: Secondary | ICD-10-CM

## 2016-06-14 DIAGNOSIS — N183 Chronic kidney disease, stage 3 (moderate): Principal | ICD-10-CM

## 2016-06-14 NOTE — Telephone Encounter (Signed)
Pt states she cannot afford Dulaglutide (TRULICITY) 3.01 UA/0.4BV SOPN   Would like something else sent in.  [facility] [address]

## 2016-06-15 ENCOUNTER — Telehealth: Payer: Self-pay | Admitting: Family

## 2016-06-15 NOTE — Telephone Encounter (Signed)
Continue current metformin and check prices and affordability of Lantus, Levemir, Alysia Penna, or [employer] .

## 2016-06-15 NOTE — Telephone Encounter (Signed)
Pt would like a call back with her lab results she does not her VM set up.

## 2016-06-16 NOTE — Telephone Encounter (Signed)
Pt aware of results. Will call insurance and call back with what is covered.

## 2016-06-16 NOTE — Telephone Encounter (Signed)
Pt aware of results 

## 2016-06-22 NOTE — Telephone Encounter (Signed)
Pt insurance covers all of these, but they are all over $200 dollars. Her Trulicity is $654 dollars.  She wants to try something else.   She has been taking her metformin.

## 2016-06-22 NOTE — Addendum Note (Signed)
Addended by: [name] D on: 06/22/2016 02:43 PM   Modules accepted: Orders

## 2016-06-22 NOTE — Telephone Encounter (Signed)
LVM with response below.  

## 2016-06-22 NOTE — Telephone Encounter (Signed)
Please advise 

## 2016-06-22 NOTE — Telephone Encounter (Signed)
Please continue metformin currently. I am sending a referral to Endocrinology for further evaluation and suggestions.

## 2016-06-29 ENCOUNTER — Encounter: Payer: Self-pay | Admitting: Endocrinology

## 2016-06-29 ENCOUNTER — Ambulatory Visit (INDEPENDENT_AMBULATORY_CARE_PROVIDER_SITE_OTHER): Payer: Medicare Other | Admitting: Endocrinology

## 2016-06-29 VITALS — BP 138/84 | HR 78 | Ht 65.0 in | Wt 136.0 lb

## 2016-06-29 DIAGNOSIS — N183 Chronic kidney disease, stage 3 (moderate): Secondary | ICD-10-CM | POA: Diagnosis not present

## 2016-06-29 DIAGNOSIS — E1122 Type 2 diabetes mellitus with diabetic chronic kidney disease: Secondary | ICD-10-CM

## 2016-06-29 DIAGNOSIS — E041 Nontoxic single thyroid nodule: Secondary | ICD-10-CM | POA: Diagnosis not present

## 2016-06-29 MED ORDER — REPAGLINIDE 2 MG PO TABS: 1.0000 mg | ORAL_TABLET | Freq: Three times a day (TID) | ORAL | 11 refills | Status: DC

## 2016-06-29 NOTE — Patient Instructions (Addendum)
Let's check a thyroid ultrasound.  you will receive a phone call, about a day and time for an appointment. good diet and exercise significantly improve the control of your diabetes.  please let me know if you wish to be referred to a dietician.  high blood sugar is very risky to your health.  you should see an eye doctor and dentist every year.  It is very important to get all recommended vaccinations.  Controlling your blood pressure and cholesterol drastically reduces the damage diabetes does to your body.  Those who smoke should quit.  Please discuss these with your doctor.  check your blood sugar once a day.  vary the time of day when you check, between before the 3 meals, and at bedtime.  also check if you have symptoms of your blood sugar being too high or too low.  please keep a record of the readings and bring it to your next appointment here (or you can bring the meter itself).  You can write it on any piece of paper.  please call us sooner if your blood sugar goes below 70, or if you have a lot of readings over 200.  You may need insulin in the future.  Therefore, please see Vaughan Basta, to learn how.  I have sent a prescription to your pharmacy, to add "repaglinide." Please come back for a follow-up appointment in 3 months.

## 2016-06-29 NOTE — Progress Notes (Addendum)
Subjective:    Patient ID: [name], female    DOB: [birth year], 66 y.o.   MRN: [id number]  HPI pt is referred by Toniann Ket, NP, for diabetes.  Pt states DM was dx'ed in 2007; he has mild neuropathy of the lower extremities; she has associated renal insuff, CVA's, and PDR; she has never been on insulin; pt says her diet and exercise are not good; she has never had GDM, pancreatitis, pancreatic surgery, severe hypoglycemia or DKA.  She takes metformin only.  She was rx'ed trulicity, but could not afford.   Past Medical History:  Diagnosis Date  . Chronic back pain   . Cough   . Diabetes mellitus, type 2 ([place])   . Fibroid    patient thinks this was the reason for her hysterectomy  . GERD (gastroesophageal reflux disease)   . History of sebaceous cyst   . Hyperlipemia   . Hypertension   . Hyperthyroidism   . Lumbar radiculopathy   . Shortness of breath 09/13/2013  . Stroke ([place])   . Tobacco abuse     Past Surgical History:  Procedure Laterality Date  . ABDOMINAL HYSTERECTOMY     --?ovaries remain  . RETINOPATHY SURGERY  2013   Dr. Zigmund Daniel    Social History   Social History  . Marital status: Divorced    Spouse name: N/A  . Number of children: 1  . Years of education: 38   Occupational History  . [employer]   Social History Main Topics  . Smoking status: Former Smoker    Packs/day: 0.50    Years: 35.00    Types: Cigarettes    Quit date: 07/07/2014  . Smokeless tobacco: Never Used     Comment: patient advised to keep diary, develop strategy and stop  . Alcohol use No  . Drug use: No  . Sexual activity: No     Comment: TAH--?ovaries remain   Other Topics Concern  . Not on file   Social History Narrative   Denies abuse and feels safe at home.    Current Outpatient Prescriptions on File Prior to Visit  Medication Sig Dispense Refill  . amLODipine (NORVASC) 10 MG tablet Take 1 tablet (10 mg total) by mouth daily. 90 tablet 3  .  aspirin 325 MG tablet Take 325 mg by mouth daily.    [name] atorvastatin (LIPITOR) 80 MG tablet TAKE 1 TABLET EVERY DAY 90 tablet 3  . cloNIDine (CATAPRES) 0.2 MG tablet Take 1 tablet (0.2 mg total) by mouth 3 (three) times daily. 90 tablet 0  . furosemide (LASIX) 40 MG tablet Take 40 mg by mouth daily.    [name] losartan-hydrochlorothiazide (HYZAAR) 100-25 MG tablet TAKE 1 TABLET BY MOUTH DAILY. 90 tablet 1  . metFORMIN (GLUCOPHAGE-XR) 750 MG 24 hr tablet TAKE 1 TABLET (750 MG TOTAL) BY MOUTH 2 (TWO) TIMES DAILY. 180 tablet 1  . metoprolol succinate (TOPROL-XL) 100 MG 24 hr tablet Take 1 tablet (100 mg total) by mouth daily. Take with or immediately following a meal. 30 tablet 1  . minoxidil (LONITEN) 2.5 MG tablet Take 2.5 mg by mouth daily.    [name] test strip USE AS INSTRUCTED (TESTS 2 TIMES A DAY) 100 each 7   No current facility-administered medications on file prior to visit.     No Known Allergies  Family History  Problem Relation Age of Onset  . Hypertension Mother   . Sleep apnea Mother   .  Diabetes Mother   . Hyperlipidemia Mother   . Cancer Father        lung cancer - smoker  . Hypertension Sister   . Diabetes Sister   . Hypertension Brother   . Hypertension Sister   . Diabetes Brother   . Hypertension Brother   . Breast cancer Neg Hx     BP 138/84   Pulse 78   Ht 5\' 5"  (1.651 m)   Wt 136 lb (61.7 kg)   SpO2 95%   BMI 22.63 kg/m    Review of Systems denies weight loss, blurry vision, headache, chest pain, sob, n/v, urinary frequency, muscle cramps, depression, rhinorrhea, and easy bruising.  She has dry skin and cold intolerance.    Objective:   Physical Exam VS: see vs page GEN: no distress HEAD: head: no deformity eyes: no periorbital swelling, no proptosis.   external nose and ears are normal mouth: no lesion seen NECK: right thyroid nodule is noted, 1-2 cm CHEST WALL: no deformity LUNGS: clear to auscultation CV: reg rate and rhythm, no  murmur ABD: abdomen is soft, nontender.  no hepatosplenomegaly.  not distended.  no hernia MUSCULOSKELETAL: muscle bulk and strength are grossly normal.  no obvious joint swelling.  gait is normal and steady EXTEMITIES: no deformity.  no ulcer on the feet.  feet are of normal color and temp.  1+ bilat leg edema PULSES: dorsalis pedis intact bilat.  no carotid bruit NEURO:  cn 2-12 grossly intact.   readily moves all 4's.  sensation is intact to touch on the feet SKIN:  Normal texture and temperature.  No rash or suspicious lesion is visible.   NODES:  None palpable at the neck PSYCH: alert, well-oriented.  Does not appear anxious nor depressed.    Lab Results  Component Value Date   HGBA1C 9.3 (H) 06/13/2016   Lab Results  Component Value Date   CREATININE 1.55 (H) 01/11/2016   BUN 31 (H) 01/11/2016   NA 133 (L) 01/11/2016   K 3.7 01/11/2016   CL 96 01/11/2016   CO2 29 01/11/2016   I have reviewed outside records, and summarized: Pt was noted to have elevated a1c, and referred here.  She was advised to d/c metformin due to renal insuff.       Assessment & Plan:  Type 2 DM, with DR: she may be developing type 1.  Edema: this limits rx options Renal insuff: when glycemic control is improved, we'll need to reduce metformin.  Thyroid nodule, new.   Patient Instructions  Let's check a thyroid ultrasound.  you will receive a phone call, about a day and time for an appointment. good diet and exercise significantly improve the control of your diabetes.  please let me know if you wish to be referred to a dietician.  high blood sugar is very risky to your health.  you should see an eye doctor and dentist every year.  It is very important to get all recommended vaccinations.  Controlling your blood pressure and cholesterol drastically reduces the damage diabetes does to your body.  Those who smoke should quit.  Please discuss these with your doctor.  check your blood sugar once a day.   vary the time of day when you check, between before the 3 meals, and at bedtime.  also check if you have symptoms of your blood sugar being too high or too low.  please keep a record of the readings and bring it to your next  appointment here (or you can bring the meter itself).  You can write it on any piece of paper.  please call us sooner if your blood sugar goes below 70, or if you have a lot of readings over 200.  You may need insulin in the future.  Therefore, please see Vaughan Basta, to learn how.  I have sent a prescription to your pharmacy, to add "repaglinide." Please come back for a follow-up appointment in 3 months.

## 2016-07-01 ENCOUNTER — Ambulatory Visit
Admission: RE | Admit: 2016-07-01 | Discharge: 2016-07-01 | Disposition: A | Discharge status: Home / Self Care | Payer: Medicare Other | Source: Ambulatory Visit | Attending: Endocrinology | Admitting: Endocrinology

## 2016-07-01 DIAGNOSIS — E041 Nontoxic single thyroid nodule: Secondary | ICD-10-CM

## 2016-07-02 ENCOUNTER — Other Ambulatory Visit: Payer: Self-pay | Admitting: Family

## 2016-07-04 ENCOUNTER — Telehealth: Payer: Self-pay | Admitting: Nutrition

## 2016-07-04 DIAGNOSIS — N183 Chronic kidney disease, stage 3 unspecified: Secondary | ICD-10-CM

## 2016-07-04 DIAGNOSIS — E1122 Type 2 diabetes mellitus with diabetic chronic kidney disease: Secondary | ICD-10-CM

## 2016-07-04 NOTE — Telephone Encounter (Signed)
Patient reports that FBS Sunday was 92, and this morning it was 56.  Do you still want her to take the Prandin this AM?

## 2016-07-04 NOTE — Telephone Encounter (Signed)
Any idea why it is lower? Please come in to have blood test drawn. I have ordered.

## 2016-07-07 ENCOUNTER — Other Ambulatory Visit: Payer: Medicare Other

## 2016-07-07 DIAGNOSIS — E1122 Type 2 diabetes mellitus with diabetic chronic kidney disease: Secondary | ICD-10-CM | POA: Diagnosis not present

## 2016-07-07 DIAGNOSIS — N183 Chronic kidney disease, stage 3 (moderate): Principal | ICD-10-CM

## 2016-07-08 ENCOUNTER — Telehealth: Payer: Self-pay | Admitting: Endocrinology

## 2016-07-08 NOTE — Telephone Encounter (Signed)
Patient was returning call to schedule an appointment. She thinks that it may be for some type of testing.

## 2016-07-08 NOTE — Telephone Encounter (Signed)
I contacted the patient and scheduled her biopsy with Dr. Loanne Drilling. Patient scheduled for 07/22/2016 at 230pm.

## 2016-07-11 ENCOUNTER — Other Ambulatory Visit: Payer: Self-pay | Admitting: Endocrinology

## 2016-07-11 LAB — FRUCTOSAMINE: Fructosamine: 395 umol/L — ABNORMAL HIGH (ref 190–270)

## 2016-07-11 MED ORDER — REPAGLINIDE 2 MG PO TABS: 2.0000 mg | ORAL_TABLET | Freq: Three times a day (TID) | ORAL | 11 refills | Status: DC

## 2016-07-12 DIAGNOSIS — N2581 Secondary hyperparathyroidism of renal origin: Secondary | ICD-10-CM | POA: Diagnosis not present

## 2016-07-12 DIAGNOSIS — E041 Nontoxic single thyroid nodule: Secondary | ICD-10-CM | POA: Diagnosis not present

## 2016-07-12 DIAGNOSIS — E119 Type 2 diabetes mellitus without complications: Secondary | ICD-10-CM | POA: Diagnosis not present

## 2016-07-12 DIAGNOSIS — I10 Essential (primary) hypertension: Secondary | ICD-10-CM | POA: Diagnosis not present

## 2016-07-12 DIAGNOSIS — N183 Chronic kidney disease, stage 3 (moderate): Secondary | ICD-10-CM | POA: Diagnosis not present

## 2016-07-15 DIAGNOSIS — E113293 Type 2 diabetes mellitus with mild nonproliferative diabetic retinopathy without macular edema, bilateral: Secondary | ICD-10-CM | POA: Diagnosis not present

## 2016-07-15 DIAGNOSIS — H25813 Combined forms of age-related cataract, bilateral: Secondary | ICD-10-CM | POA: Diagnosis not present

## 2016-07-20 ENCOUNTER — Ambulatory Visit (INDEPENDENT_AMBULATORY_CARE_PROVIDER_SITE_OTHER): Payer: Medicare Other | Admitting: Podiatry

## 2016-07-20 ENCOUNTER — Encounter: Payer: Self-pay | Admitting: Podiatry

## 2016-07-20 DIAGNOSIS — B351 Tinea unguium: Secondary | ICD-10-CM

## 2016-07-20 DIAGNOSIS — E1151 Type 2 diabetes mellitus with diabetic peripheral angiopathy without gangrene: Secondary | ICD-10-CM

## 2016-07-20 DIAGNOSIS — M79674 Pain in right toe(s): Secondary | ICD-10-CM

## 2016-07-20 DIAGNOSIS — M79675 Pain in left toe(s): Secondary | ICD-10-CM

## 2016-07-20 NOTE — Progress Notes (Signed)
   Subjective:    Patient ID: [name], female    DOB: 18-Feb-1950, 66 y.o.   MRN: [id number]  HPI This patient presents today complaining of thickened and elongated toenails which are uncomfortable and walking wearing shoes and patient unable to trim the toenails are self. He says the nails of gradually thickened over the last 4-6 months. Patient has attempted self treatment, however, not able to trim the nails because of deformity and thickness Patient is diabetic approximately 8 years and denies history of skin ulceration, claudication or amputation Patient has 20+ year history of smoking and discontinued smoking in 2016   Review of Systems  Constitutional: Positive for chills.  Eyes: Positive for itching.  Cardiovascular: Positive for leg swelling.  Musculoskeletal: Positive for gait problem.  Neurological: Positive for light-headedness and headaches.  All other systems reviewed and are negative.      Objective:   Physical Exam  Patient estimates she is 5 foot 5 inches weighs approximately 135 pounds  Vascular: DP pulses 1/4 bilaterally PT pulses nonpalpable bilaterally Capillary reflex with normal limits bilaterally  Neurological: Sensation to 10 g monofilament wire intact 9/9 bilaterally Vibratory sensation reactive bilaterally Ankle reflexes reactive bilaterally  Dermatological: No open skin lesions bilaterally Dry skin with absent hair growth bilaterally The toenails 6-10 are elongated, form, discolored with tenderness to palpation  Musculoskeletal: There is no restriction ankle, subtalar, midtarsal joints bilaterally Manual motor testing dorsi flexion, plantar flexion, inversion, eversion 5/5 bilaterally       Assessment & Plan:   Assessment: Diabetic with peripheral arterial disease Patient has negative history for claudication open wounds Mycotic toenails 6-10  Plan: Reviewed the results of exam with patient today and may patient aware that she  had decrease pedal pulse and feet without history of claudication open wounds. We'll continue to monitor patient The toenails 6-10 were debrided mechanically and electrically without any bleeding  Reappoint 3 months

## 2016-07-20 NOTE — Patient Instructions (Signed)

## 2016-07-22 ENCOUNTER — Other Ambulatory Visit (HOSPITAL_COMMUNITY)
Admission: RE | Admit: 2016-07-22 | Discharge: 2016-07-22 | Disposition: A | Discharge status: Home / Self Care | Payer: Medicare Other | Source: Ambulatory Visit | Attending: Endocrinology | Admitting: Endocrinology

## 2016-07-22 ENCOUNTER — Ambulatory Visit (INDEPENDENT_AMBULATORY_CARE_PROVIDER_SITE_OTHER): Payer: Medicare Other | Admitting: Endocrinology

## 2016-07-22 ENCOUNTER — Encounter: Payer: Self-pay | Admitting: Endocrinology

## 2016-07-22 VITALS — BP 146/70 | HR 64 | Ht 65.0 in | Wt 137.0 lb

## 2016-07-22 DIAGNOSIS — N183 Chronic kidney disease, stage 3 (moderate): Secondary | ICD-10-CM

## 2016-07-22 DIAGNOSIS — E041 Nontoxic single thyroid nodule: Secondary | ICD-10-CM | POA: Diagnosis not present

## 2016-07-22 DIAGNOSIS — E1122 Type 2 diabetes mellitus with diabetic chronic kidney disease: Secondary | ICD-10-CM | POA: Insufficient documentation

## 2016-07-22 DIAGNOSIS — Z87891 Personal history of nicotine dependence: Secondary | ICD-10-CM | POA: Insufficient documentation

## 2016-07-22 NOTE — Progress Notes (Signed)
Subjective:    Patient ID: [name], female    DOB: 1950-10-06, 66 y.o.   MRN: [id number]  HPI Pt returns for f/u of diabetes mellitus:  DM type: 2, but she may be developing type 1 Dx'ed: 6384 Complications: polyneuropathy, renal insuff, CVA's, and PDR Therapy: 2 oral meds GDM: never DKA: never Severe hypoglycemia: never.   Pancreatitis: never Pancreatic imaging: never Other: she cannot afford brand-name meds; she has never been on insulin, but she has taken trulicity; renal insuff limits metformin dosage; edema limits rx options Interval history:  no cbg record, but states cbg's vary from 52-150.  It is lowest fasting.  Past Medical History:  Diagnosis Date  . Chronic back pain   . Cough   . Diabetes mellitus, type 2 ([place])   . Fibroid    patient thinks this was the reason for her hysterectomy  . GERD (gastroesophageal reflux disease)   . History of sebaceous cyst   . Hyperlipemia   . Hypertension   . Hyperthyroidism   . Lumbar radiculopathy   . Shortness of breath 09/13/2013  . Stroke ([place])   . Tobacco abuse     Past Surgical History:  Procedure Laterality Date  . ABDOMINAL HYSTERECTOMY     --?ovaries remain  . RETINOPATHY SURGERY  2013   Dr. Zigmund Daniel    Social History   Social History  . Marital status: Divorced    Spouse name: N/A  . Number of children: 1  . Years of education: 57   Occupational History  . [employer]   Social History Main Topics  . Smoking status: Former Smoker    Packs/day: 0.50    Years: 35.00    Types: Cigarettes    Quit date: 07/07/2014  . Smokeless tobacco: Never Used     Comment: patient advised to keep diary, develop strategy and stop  . Alcohol use No  . Drug use: No  . Sexual activity: No     Comment: TAH--?ovaries remain   Other Topics Concern  . Not on file   Social History Narrative   Denies abuse and feels safe at home.    Current Outpatient Prescriptions on File Prior to Visit    Medication Sig Dispense Refill  . amLODipine (NORVASC) 10 MG tablet Take 1 tablet (10 mg total) by mouth daily. 90 tablet 3  . aspirin 325 MG tablet Take 325 mg by mouth daily.    [name] atorvastatin (LIPITOR) 80 MG tablet TAKE 1 TABLET EVERY DAY 90 tablet 1  . cloNIDine (CATAPRES) 0.2 MG tablet Take 1 tablet (0.2 mg total) by mouth 3 (three) times daily. 90 tablet 0  . furosemide (LASIX) 40 MG tablet Take 40 mg by mouth daily.    [name] losartan-hydrochlorothiazide (HYZAAR) 100-25 MG tablet TAKE 1 TABLET BY MOUTH DAILY. 90 tablet 1  . metFORMIN (GLUCOPHAGE-XR) 750 MG 24 hr tablet TAKE 1 TABLET (750 MG TOTAL) BY MOUTH 2 (TWO) TIMES DAILY. 180 tablet 1  . metoprolol succinate (TOPROL-XL) 100 MG 24 hr tablet Take 1 tablet (100 mg total) by mouth daily. Take with or immediately following a meal. 30 tablet 1  . minoxidil (LONITEN) 2.5 MG tablet Take 2.5 mg by mouth daily.    [name] test strip USE AS INSTRUCTED (TESTS 2 TIMES A DAY) 100 each 7  . repaglinide (PRANDIN) 2 MG tablet Take 1 tablet (2 mg total) by mouth 3 (three) times daily before meals. 100 tablet 11  No current facility-administered medications on file prior to visit.     No Known Allergies  Family History  Problem Relation Age of Onset  . Hypertension Mother   . Sleep apnea Mother   . Diabetes Mother   . Hyperlipidemia Mother   . Cancer Father        lung cancer - smoker  . Hypertension Sister   . Diabetes Sister   . Hypertension Brother   . Hypertension Sister   . Diabetes Brother   . Hypertension Brother   . Breast cancer Neg Hx     BP (!) 146/70   Pulse 64   Ht 5\' 5"  (1.651 m)   Wt 137 lb (62.1 kg)   SpO2 94%   BMI 22.80 kg/m   Review of Systems Denies LOC.    Objective:   Physical Exam VITAL SIGNS:  See vs page GENERAL: no distress Neck: the thyroid nodule is confirmed to be on the right.   thyroid needle bx: consent obtained, signed form on chart The area is first sprayed with cooling  agent local: xylocaine 2%, with epinephrine prep: alcohol pad 4 bxs are done with 25 and 88F needles no complications      Assessment & Plan:  Type 2 DM, with renal insuff: uncertain control.  Check fructosamine. Thyroid nodule, s/p bx.  Patient Instructions  We'll let you know about the biopsy results.   check your blood sugar once a day.  vary the time of day when you check, between before the 3 meals, and at bedtime.  also check if you have symptoms of your blood sugar being too high or too low.  please keep a record of the readings and bring it to your next appointment here (or you can bring the meter itself).  You can write it on any piece of paper.  please call us sooner if your blood sugar goes below 70, or if you have a lot of readings over 200.  blood tests are requested for you today.  We'll let you know about the results of this, too.  Please come back for a follow-up appointment in 3 months.

## 2016-07-22 NOTE — Patient Instructions (Addendum)
We'll let you know about the biopsy results.   check your blood sugar once a day.  vary the time of day when you check, between before the 3 meals, and at bedtime.  also check if you have symptoms of your blood sugar being too high or too low.  please keep a record of the readings and bring it to your next appointment here (or you can bring the meter itself).  You can write it on any piece of paper.  please call us sooner if your blood sugar goes below 70, or if you have a lot of readings over 200.  blood tests are requested for you today.  We'll let you know about the results of this, too.  Please come back for a follow-up appointment in 3 months.

## 2016-07-25 ENCOUNTER — Other Ambulatory Visit: Payer: Self-pay | Admitting: Endocrinology

## 2016-07-25 LAB — FRUCTOSAMINE: FRUCTOSAMINE: 341 umol/L — AB (ref 190–270)

## 2016-07-25 MED ORDER — BROMOCRIPTINE MESYLATE 2.5 MG PO TABS: ORAL_TABLET | ORAL | 11 refills | Status: DC

## 2016-07-26 ENCOUNTER — Ambulatory Visit: Payer: Medicare Other | Admitting: Family

## 2016-07-26 NOTE — Telephone Encounter (Signed)
Patient reports that she has had no more low blood sugars.  She did not test today, but said yesterday, FBS was 130.  She denies any low blood sugar symptoms  during the day as well.

## 2016-08-08 ENCOUNTER — Encounter: Payer: Self-pay | Admitting: Family

## 2016-08-08 ENCOUNTER — Ambulatory Visit (INDEPENDENT_AMBULATORY_CARE_PROVIDER_SITE_OTHER): Payer: Medicare Other | Admitting: Family

## 2016-08-08 VITALS — BP 142/72 | HR 66 | Temp 98.0°F | Resp 16 | Ht 65.0 in | Wt 137.8 lb

## 2016-08-08 DIAGNOSIS — I10 Essential (primary) hypertension: Secondary | ICD-10-CM | POA: Diagnosis not present

## 2016-08-08 NOTE — Progress Notes (Signed)
Subjective:    Patient ID: [name], female    DOB: 12-13-1950, 66 y.o.   MRN: [id number]  Chief Complaint  Patient presents with  . Follow-up    hypertension    HPI:  [name] is a 66 y.o. female who  has a past medical history of Chronic back pain; Cough; Diabetes mellitus, type 2 ([place]); Fibroid; GERD (gastroesophageal reflux disease); History of sebaceous cyst; Hyperlipemia; Hypertension; Hyperthyroidism; Lumbar radiculopathy; Shortness of breath (09/13/2013); Stroke [facility]); and Tobacco abuse. and presents today for a follow up office visit.   Hypertension - Currently maintained on amlodipine, losartan-hctz, minoxidil,  clonidine, furosemide and metoprolol. Reports taking the medications as prescribed and denies adverse side effects or hypotensive readings. Blood pressures at home have been labile between 120-150/70-80. Denies changes in vision, worst headache of life or new symptoms of end organ damage. Working on following a low sodium diet. Continues to work with nephrology.  BP Readings from Last 3 Encounters:  08/08/16 (!) 142/72  07/22/16 (!) 146/70  06/29/16 138/84    No Known Allergies    Outpatient Medications Prior to Visit  Medication Sig Dispense Refill  . amLODipine (NORVASC) 10 MG tablet Take 1 tablet (10 mg total) by mouth daily. 90 tablet 3  . aspirin 325 MG tablet Take 325 mg by mouth daily.    [name] atorvastatin (LIPITOR) 80 MG tablet TAKE 1 TABLET EVERY DAY 90 tablet 1  . bromocriptine (PARLODEL) 2.5 MG tablet 1/4 tab daily 8 tablet 11  . cloNIDine (CATAPRES) 0.2 MG tablet Take 1 tablet (0.2 mg total) by mouth 3 (three) times daily. 90 tablet 0  . furosemide (LASIX) 40 MG tablet Take 40 mg by mouth daily.    [name] losartan-hydrochlorothiazide (HYZAAR) 100-25 MG tablet TAKE 1 TABLET BY MOUTH DAILY. 90 tablet 1  . metFORMIN (GLUCOPHAGE-XR) 750 MG 24 hr tablet TAKE 1 TABLET (750 MG TOTAL) BY MOUTH 2 (TWO) TIMES DAILY. 180 tablet 1  . metoprolol  succinate (TOPROL-XL) 100 MG 24 hr tablet Take 1 tablet (100 mg total) by mouth daily. Take with or immediately following a meal. 30 tablet 1  . minoxidil (LONITEN) 2.5 MG tablet Take 2.5 mg by mouth 2 (two) times daily.    [name] test strip USE AS INSTRUCTED (TESTS 2 TIMES A DAY) 100 each 7  . repaglinide (PRANDIN) 2 MG tablet Take 1 tablet (2 mg total) by mouth 3 (three) times daily before meals. 100 tablet 11   No facility-administered medications prior to visit.      Review of Systems  Constitutional: Negative for chills and fever.  Eyes:       Negative for changes in vision  Respiratory: Negative for cough, chest tightness and wheezing.   Cardiovascular: Negative for chest pain, palpitations and leg swelling.  Neurological: Negative for dizziness, weakness and light-headedness.      Objective:    BP (!) 142/72 (BP Location: Left Arm, Patient Position: Sitting, Cuff Size: Normal)   Pulse 66   Temp 98 F (36.7 C) (Oral)   Resp 16   Ht 5\' 5"  (1.651 m)   Wt 137 lb 12.8 oz (62.5 kg)   SpO2 98%   BMI 22.93 kg/m  Nursing note and vital signs reviewed.  Physical Exam  Constitutional: She is oriented to person, place, and time. She appears well-developed and well-nourished. No distress.  Cardiovascular: Normal rate, regular rhythm, normal heart sounds and intact distal pulses.   Pulmonary/Chest: Effort normal and  breath sounds normal.  Neurological: She is alert and oriented to person, place, and time.  Skin: Skin is warm and dry.  Psychiatric: She has a normal mood and affect. Her behavior is normal. Judgment and thought content normal.       Assessment & Plan:   Problem List Items Addressed This Visit      Cardiovascular and Mediastinum   Malignant hypertension - Primary    Hypertension appears improved with the current medication regimen and no hypotensive readings. Concern remains for patient compliance based on refills. Encouraged patient compliance with  medication regimen. Continue current dosage of metoprolol, clonidine, amlodipine, losartan-hctz, and minoxidil. Continue to monitor blood pressure at home and follow a low sodium diet.           I am having Ms. Neumeier maintain her aspirin, minoxidil, cloNIDine, metoprolol succinate, amLODipine, furosemide, ONETOUCH VERIO, metFORMIN, atorvastatin, losartan-hydrochlorothiazide, repaglinide, and bromocriptine.   Follow-up: Return in about 4 months (around 12/09/2016), or if symptoms worsen or fail to improve.  [name], FNP

## 2016-08-08 NOTE — Patient Instructions (Signed)
Thank you for choosing [facility].  SUMMARY AND INSTRUCTIONS:  Please continue to take your medications as prescribed.  Follow up with Dr. Loanne Drilling for your diabetes.  Monitor your blood pressure at home and continue to follow a low sodium diet.   Follow up:  If your symptoms worsen or fail to improve, please contact our office for further instruction, or in case of emergency go directly to the emergency room at the closest medical facility.

## 2016-08-08 NOTE — Assessment & Plan Note (Signed)
Hypertension appears improved with the current medication regimen and no hypotensive readings. Concern remains for patient compliance based on refills. Encouraged patient compliance with medication regimen. Continue current dosage of metoprolol, clonidine, amlodipine, losartan-hctz, and minoxidil. Continue to monitor blood pressure at home and follow a low sodium diet.

## 2016-08-19 ENCOUNTER — Ambulatory Visit: Payer: Medicare Other | Admitting: Dietician

## 2016-09-25 ENCOUNTER — Other Ambulatory Visit: Payer: Self-pay | Admitting: Family

## 2016-09-28 DIAGNOSIS — N2581 Secondary hyperparathyroidism of renal origin: Secondary | ICD-10-CM | POA: Diagnosis not present

## 2016-09-28 DIAGNOSIS — I10 Essential (primary) hypertension: Secondary | ICD-10-CM | POA: Diagnosis not present

## 2016-09-28 DIAGNOSIS — D631 Anemia in chronic kidney disease: Secondary | ICD-10-CM | POA: Diagnosis not present

## 2016-09-28 DIAGNOSIS — E119 Type 2 diabetes mellitus without complications: Secondary | ICD-10-CM | POA: Diagnosis not present

## 2016-09-28 DIAGNOSIS — N183 Chronic kidney disease, stage 3 (moderate): Secondary | ICD-10-CM | POA: Diagnosis not present

## 2016-10-03 ENCOUNTER — Encounter: Payer: Self-pay | Admitting: Endocrinology

## 2016-10-03 ENCOUNTER — Ambulatory Visit (INDEPENDENT_AMBULATORY_CARE_PROVIDER_SITE_OTHER): Payer: Medicare Other | Admitting: Endocrinology

## 2016-10-03 VITALS — BP 136/63 | HR 73 | Ht 65.0 in | Wt 133.0 lb

## 2016-10-03 DIAGNOSIS — E1351 Other specified diabetes mellitus with diabetic peripheral angiopathy without gangrene: Secondary | ICD-10-CM

## 2016-10-03 LAB — POCT GLYCOSYLATED HEMOGLOBIN (HGB A1C): Hemoglobin A1C: 7.7

## 2016-10-03 MED ORDER — METFORMIN HCL ER 750 MG PO TB24: 750.0000 mg | ORAL_TABLET | Freq: Every day | ORAL | 5 refills | Status: DC

## 2016-10-03 NOTE — Progress Notes (Signed)
Subjective:    Patient ID: [name], female    DOB: 03/12/1950, 66 y.o.   MRN: [id number]  HPI Pt returns for f/u of diabetes mellitus:  DM type: 2, but she may be developing type 1 Dx'ed: 6389 Complications: polyneuropathy, renal insuff, CVA's, and PDR Therapy: 2 oral meds GDM: never DKA: never Severe hypoglycemia: never.   Pancreatitis: never Pancreatic imaging: never Other: she cannot afford brand-name meds; she has never been on insulin, but she has taken trulicity; renal insuff limits metformin dosage; edema limits rx options Interval history:  Pt says she has been on metformin x a few years.  no cbg record, but states cbg's vary from 40-200's.  There is no trend throughout the day.  She sometimes skips the repaglinide, due to hypoglycemia.  Past Medical History:  Diagnosis Date  . Chronic back pain   . Cough   . Diabetes mellitus, type 2 ([place])   . Fibroid    patient thinks this was the reason for her hysterectomy  . GERD (gastroesophageal reflux disease)   . History of sebaceous cyst   . Hyperlipemia   . Hypertension   . Hyperthyroidism   . Lumbar radiculopathy   . Shortness of breath 09/13/2013  . Stroke ([place])   . Tobacco abuse     Past Surgical History:  Procedure Laterality Date  . ABDOMINAL HYSTERECTOMY     --?ovaries remain  . RETINOPATHY SURGERY  2013   Dr. Zigmund Daniel    Social History   Social History  . Marital status: Divorced    Spouse name: N/A  . Number of children: 1  . Years of education: 17   Occupational History  . [employer]   Social History Main Topics  . Smoking status: Former Smoker    Packs/day: 0.50    Years: 35.00    Types: Cigarettes    Quit date: 07/07/2014  . Smokeless tobacco: Never Used     Comment: patient advised to keep diary, develop strategy and stop  . Alcohol use No  . Drug use: No  . Sexual activity: No     Comment: TAH--?ovaries remain   Other Topics Concern  . Not on file    Social History Narrative   Denies abuse and feels safe at home.    Current Outpatient Prescriptions on File Prior to Visit  Medication Sig Dispense Refill  . amLODipine (NORVASC) 10 MG tablet Take 1 tablet (10 mg total) by mouth daily. 90 tablet 3  . aspirin 325 MG tablet Take 325 mg by mouth daily.    [name] atorvastatin (LIPITOR) 80 MG tablet TAKE 1 TABLET EVERY DAY 90 tablet 1  . cloNIDine (CATAPRES) 0.2 MG tablet Take 1 tablet (0.2 mg total) by mouth 3 (three) times daily. 90 tablet 0  . furosemide (LASIX) 40 MG tablet Take 40 mg by mouth daily.    [name] losartan-hydrochlorothiazide (HYZAAR) 100-25 MG tablet TAKE 1 TABLET BY MOUTH DAILY. 90 tablet 1  . metoprolol succinate (TOPROL-XL) 100 MG 24 hr tablet Take 1 tablet (100 mg total) by mouth daily. Take with or immediately following a meal. 30 tablet 1  . minoxidil (LONITEN) 2.5 MG tablet Take 2.5 mg by mouth 2 (two) times daily.    [name] test strip USE AS INSTRUCTED (TESTS 2 TIMES A DAY) 100 each 7  . repaglinide (PRANDIN) 2 MG tablet Take 1 tablet (2 mg total) by mouth 3 (three) times daily before meals. 100 tablet 11  .  bromocriptine (PARLODEL) 2.5 MG tablet 1/4 tab daily (Patient not taking: Reported on 10/03/2016) 8 tablet 11   No current facility-administered medications on file prior to visit.     No Known Allergies  Family History  Problem Relation Age of Onset  . Hypertension Mother   . Sleep apnea Mother   . Diabetes Mother   . Hyperlipidemia Mother   . Cancer Father        lung cancer - smoker  . Hypertension Sister   . Diabetes Sister   . Hypertension Brother   . Hypertension Sister   . Diabetes Brother   . Hypertension Brother   . Breast cancer Neg Hx     BP 136/63   Pulse 73   Ht 5\' 5"  (1.651 m)   Wt 133 lb (60.3 kg)   BMI 22.13 kg/m    Review of Systems Denies LOC    Objective:   Physical Exam VITAL SIGNS:  See vs page GENERAL: no distress Pulses: foot pulses are intact bilaterally.    MSK: no deformity of the feet or ankles.  CV: no edema of the legs or ankles Skin:  no ulcer on the feet or ankles.  normal color and temp on the feet and ankles Neuro: sensation is intact to touch on the feet and ankles.    Lab Results  Component Value Date   CREATININE 1.55 (H) 01/11/2016   BUN 31 (H) 01/11/2016   NA 133 (L) 01/11/2016   K 3.7 01/11/2016   CL 96 01/11/2016   CO2 29 01/11/2016   Lab Results  Component Value Date   HGBA1C 7.7 10/03/2016      Assessment & Plan:  Type 2 DM: she needs increased rx, if it can be done with a regimen that avoids or minimizes hypoglycemia. Renal failure: he needs to phase out metformin Hypoglycemia: for now, we need to reduce rx, allowing her to take the full dosage of repaglinide. Weight loss, uncertain etiology.  Patient Instructions  check your blood sugar once a day.  vary the time of day when you check, between before the 3 meals, and at bedtime.  also check if you have symptoms of your blood sugar being too high or too low.  please keep a record of the readings and bring it to your next appointment here (or you can bring the meter itself).  You can write it on any piece of paper.  please call us sooner if your blood sugar goes below 70, or if you have a lot of readings over 200.   Please reduce the metformin to 1 pill per day.   Please continue the same other diabetes medications.  Please see Mr Elna Breslow, about your weight loss Please come back for a follow-up appointment in 2 months.

## 2016-10-03 NOTE — Patient Instructions (Addendum)
check your blood sugar once a day.  vary the time of day when you check, between before the 3 meals, and at bedtime.  also check if you have symptoms of your blood sugar being too high or too low.  please keep a record of the readings and bring it to your next appointment here (or you can bring the meter itself).  You can write it on any piece of paper.  please call us sooner if your blood sugar goes below 70, or if you have a lot of readings over 200.   Please reduce the metformin to 1 pill per day.   Please continue the same other diabetes medications.  Please see [name], about your weight loss Please come back for a follow-up appointment in 2 months.

## 2016-10-07 ENCOUNTER — Telehealth: Payer: Self-pay | Admitting: Endocrinology

## 2016-10-07 NOTE — Telephone Encounter (Signed)
Please try stopping x 2 days, then try resuming.  Please let us know if itching happens again.

## 2016-10-07 NOTE — Telephone Encounter (Signed)
Patient called in reference to new Rx repaglinide (PRANDIN) 2 MG tablet. Patient stated she has been itching since she has been on this and believes that this is the cause of the itching. Please call patient and advise.

## 2016-10-10 NOTE — Telephone Encounter (Signed)
Notified patient and she stated she would let us know if itching reoccurred.

## 2016-10-12 ENCOUNTER — Other Ambulatory Visit (HOSPITAL_COMMUNITY): Payer: Self-pay | Admitting: *Deleted

## 2016-10-13 ENCOUNTER — Ambulatory Visit (HOSPITAL_COMMUNITY)
Admission: RE | Admit: 2016-10-13 | Discharge: 2016-10-13 | Disposition: A | Discharge status: Home / Self Care | Payer: Medicare Other | Source: Ambulatory Visit | Attending: Nephrology | Admitting: Nephrology

## 2016-10-13 DIAGNOSIS — N189 Chronic kidney disease, unspecified: Secondary | ICD-10-CM | POA: Diagnosis not present

## 2016-10-13 DIAGNOSIS — D631 Anemia in chronic kidney disease: Secondary | ICD-10-CM | POA: Diagnosis not present

## 2016-10-13 MED ORDER — SODIUM CHLORIDE 0.9 % IV SOLN
510.0000 mg | Freq: Once | INTRAVENOUS | Status: AC
  Administered 2016-10-13: 510 mg via INTRAVENOUS
  Filled 2016-10-13: qty 17

## 2016-10-17 NOTE — Telephone Encounter (Signed)
Please try resuming the repaglinide.  Call if itching recurs.

## 2016-10-17 NOTE — Telephone Encounter (Signed)
Spoke with patient & asked her to resume previous does of repaglinide.

## 2016-10-17 NOTE — Telephone Encounter (Signed)
Patient called to advise that she is no longer itching and needs to know what Dr. Loanne Drilling wants her to take now for her insulin. Call patient to advise.

## 2016-10-17 NOTE — Telephone Encounter (Signed)
Tried to return patient's call & ask her to resume repaglinide but no VM set up.

## 2016-10-17 NOTE — Telephone Encounter (Signed)
Patient returning missed call. Advised Judson Roch was going to tell her to resume repaglinide.  Please give a call back to clarify.  Ty,  -LL

## 2016-10-18 ENCOUNTER — Encounter: Payer: Self-pay | Admitting: Podiatry

## 2016-10-18 ENCOUNTER — Ambulatory Visit (INDEPENDENT_AMBULATORY_CARE_PROVIDER_SITE_OTHER): Payer: Medicare Other | Admitting: Podiatry

## 2016-10-18 DIAGNOSIS — E1151 Type 2 diabetes mellitus with diabetic peripheral angiopathy without gangrene: Secondary | ICD-10-CM

## 2016-10-18 DIAGNOSIS — M79675 Pain in left toe(s): Secondary | ICD-10-CM

## 2016-10-18 DIAGNOSIS — B351 Tinea unguium: Secondary | ICD-10-CM

## 2016-10-18 DIAGNOSIS — M79674 Pain in right toe(s): Secondary | ICD-10-CM | POA: Diagnosis not present

## 2016-10-18 NOTE — Patient Instructions (Signed)

## 2016-10-18 NOTE — Progress Notes (Signed)
Patient ID: [name], female   DOB: 12/30/1950, 66 y.o.   MRN: [id number]   Subjective:  This patient presents today complaining of thickened and elongated toenails which are uncomfortable and walking wearing shoes and patient unable to trim the toenails are self. He says the nails of gradually thickened over the last 4-6 months. Patient has attempted self treatment, however, not able to trim the nails because of deformity and thickness Patient is diabetic approximately 8 years and denies history of skin ulceration, claudication or amputation Patient has 20+ year history of smoking and discontinued smoking in 2016  Objective: Patient estimates she is 5 foot 5 inches weighs approximately 135 pounds  Vascular: DP pulses 1/4 bilaterally PT pulses nonpalpable bilaterally Capillary reflex with normal limits bilaterally  Neurological: Sensation to 10 g monofilament wire intact 9/9 bilaterally Vibratory sensation reactive bilaterally Ankle reflexes reactive bilaterally  Dermatological: No open skin lesions bilaterally Dry skin with absent hair growth bilaterally The toenails 6-10 are elongated, form, discolored with tenderness to palpation  Musculoskeletal: There is no restriction ankle, subtalar, midtarsal joints bilaterally Manual motor testing dorsi flexion, plantar flexion, inversion, eversion 5/5 bilaterally  Assessment: Diabetic with peripheral arterial disease Patient has negative history for claudication open wounds Mycotic toenails 6-10  Plan: Reviewed the results of exam with patient today and may patient aware that she had decrease pedal pulse and feet without history of claudication open wounds. We'll continue to monitor patient The toenails 6-10 were debrided mechanically and electrically without any bleeding  Reappoint 3 months

## 2016-10-21 ENCOUNTER — Ambulatory Visit: Payer: Medicare Other | Admitting: Endocrinology

## 2016-11-14 ENCOUNTER — Ambulatory Visit (INDEPENDENT_AMBULATORY_CARE_PROVIDER_SITE_OTHER): Payer: Medicare Other | Admitting: Ophthalmology

## 2016-12-05 ENCOUNTER — Encounter: Payer: Self-pay | Admitting: Endocrinology

## 2016-12-05 ENCOUNTER — Ambulatory Visit: Payer: Medicare Other | Admitting: Endocrinology

## 2016-12-05 VITALS — BP 116/70 | HR 61 | Wt 141.0 lb

## 2016-12-05 DIAGNOSIS — Z23 Encounter for immunization: Secondary | ICD-10-CM | POA: Diagnosis not present

## 2016-12-05 DIAGNOSIS — E1351 Other specified diabetes mellitus with diabetic peripheral angiopathy without gangrene: Secondary | ICD-10-CM | POA: Diagnosis not present

## 2016-12-05 LAB — POCT GLYCOSYLATED HEMOGLOBIN (HGB A1C): HEMOGLOBIN A1C: 6.3

## 2016-12-05 MED ORDER — METFORMIN HCL ER 500 MG PO TB24: 500.0000 mg | ORAL_TABLET | Freq: Every day | ORAL | 3 refills | Status: DC

## 2016-12-05 NOTE — Progress Notes (Signed)
Subjective:    Patient ID: [name], female    DOB: 1950/05/24, 66 y.o.   MRN: [id number]  HPI Pt returns for f/u of diabetes mellitus:  DM type: 2, but she may be developing type 1 Dx'ed: 0865 Complications: polyneuropathy, renal insuff, CVA's, and PDR Therapy: 2 oral meds GDM: never DKA: never Severe hypoglycemia: never.   Pancreatitis: never Pancreatic imaging: never Other: she cannot afford brand-name meds; she has never been on insulin, but she has taken trulicity; renal insuff limits metformin dosage; edema limits rx options.   Interval history:   no cbg record, but states cbg's are in the low-100's.  There is no trend throughout the day. She does not take parlodel. She has occasional mild mild hypoglycemia Past Medical History:  Diagnosis Date  . Chronic back pain   . Cough   . Diabetes mellitus, type 2 ([place])   . Fibroid    patient thinks this was the reason for her hysterectomy  . GERD (gastroesophageal reflux disease)   . History of sebaceous cyst   . Hyperlipemia   . Hypertension   . Hyperthyroidism   . Lumbar radiculopathy   . Shortness of breath 09/13/2013  . Stroke ([facility])   . Tobacco abuse     Past Surgical History:  Procedure Laterality Date  . ABDOMINAL HYSTERECTOMY     --?ovaries remain  . RETINOPATHY SURGERY  2013   Dr. Zigmund Daniel    Social History   Socioeconomic History  . Marital status: Divorced    Spouse name: Not on file  . Number of children: 1  . Years of education: 36  . Highest education level: Not on file  Social Needs  . Financial resource strain: Not on file  . Food insecurity - worry: Not on file  . Food insecurity - inability: Not on file  . Transportation needs - medical: Not on file  . Transportation needs - non-medical: Not on file  Occupational History  . Occupation: Insurance account manager: [employer]  Tobacco Use  . Smoking status: Former Smoker    Packs/day: 0.50    Years: 35.00    Pack years:  17.50    Types: Cigarettes    Last attempt to quit: 07/07/2014    Years since quitting: 2.4  . Smokeless tobacco: Never Used  . Tobacco comment: patient advised to keep diary, develop strategy and stop  Substance and Sexual Activity  . Alcohol use: No  . Drug use: No  . Sexual activity: No    Partners: Male    Birth control/protection: Surgical    Comment: TAH--?ovaries remain  Other Topics Concern  . Not on file  Social History Narrative   Denies abuse and feels safe at home.    Current Outpatient Medications on File Prior to Visit  Medication Sig Dispense Refill  . amLODipine (NORVASC) 10 MG tablet Take 1 tablet (10 mg total) by mouth daily. 90 tablet 3  . aspirin 325 MG tablet Take 325 mg by mouth daily.    [name] atorvastatin (LIPITOR) 80 MG tablet TAKE 1 TABLET EVERY DAY 90 tablet 1  . cloNIDine (CATAPRES) 0.2 MG tablet Take 1 tablet (0.2 mg total) by mouth 3 (three) times daily. 90 tablet 0  . furosemide (LASIX) 40 MG tablet Take 40 mg by mouth daily.    [name] losartan-hydrochlorothiazide (HYZAAR) 100-25 MG tablet TAKE 1 TABLET BY MOUTH DAILY. 90 tablet 1  . metoprolol succinate (TOPROL-XL) 100 MG 24 hr tablet  Take 1 tablet (100 mg total) by mouth daily. Take with or immediately following a meal. 30 tablet 1  . minoxidil (LONITEN) 2.5 MG tablet Take 2.5 mg by mouth 2 (two) times daily.    [name] VERIO test strip USE AS INSTRUCTED (TESTS 2 TIMES A DAY) 100 each 7  . repaglinide (PRANDIN) 2 MG tablet Take 1 tablet (2 mg total) by mouth 3 (three) times daily before meals. 100 tablet 11   No current facility-administered medications on file prior to visit.     No Known Allergies  Family History  Problem Relation Age of Onset  . Hypertension Mother   . Sleep apnea Mother   . Diabetes Mother   . Hyperlipidemia Mother   . Cancer Father        lung cancer - smoker  . Hypertension Sister   . Diabetes Sister   . Hypertension Brother   . Hypertension Sister   . Diabetes Brother    . Hypertension Brother   . Breast cancer Neg Hx     BP 116/70 (BP Location: Right Arm, Patient Position: Sitting, Cuff Size: Normal)   Pulse 61   Wt 141 lb (64 kg)   SpO2 97%   BMI 24.98 kg/m    Review of Systems Denies LOC.  She has regained weight    Objective:   Physical Exam VITAL SIGNS:  See vs page GENERAL: no distress Pulses: foot pulses are intact bilaterally.   MSK: no deformity of the feet or ankles.  CV: trace bilat edema of the legs. Skin:  no ulcer on the feet or ankles, but the skin is dry.  normal color and temp on the feet and ankles Neuro: sensation is intact to touch on the feet and ankles.    Lab Results  Component Value Date   HGBA1C 6.3 12/05/2016   Lab Results  Component Value Date   CREATININE 1.55 (H) 01/11/2016   BUN 31 (H) 01/11/2016   NA 133 (L) 01/11/2016   K 3.7 01/11/2016   CL 96 01/11/2016   CO2 29 01/11/2016       Assessment & Plan:  Type 2 DM: well-controlled.   Renal insuff: we should minimize metformin.   Weight loss, improved--we'll follow.   Patient Instructions  check your blood sugar once a day.  vary the time of day when you check, between before the 3 meals, and at bedtime.  also check if you have symptoms of your blood sugar being too high or too low.  please keep a record of the readings and bring it to your next appointment here (or you can bring the meter itself).  You can write it on any piece of paper.  please call us sooner if your blood sugar goes below 70, or if you have a lot of readings over 200.   Please reduce the metformin to 500 mg per day.   Please continue the same repaglinide.  Please come back for a follow-up appointment in 3 months.

## 2016-12-05 NOTE — Patient Instructions (Addendum)
check your blood sugar once a day.  vary the time of day when you check, between before the 3 meals, and at bedtime.  also check if you have symptoms of your blood sugar being too high or too low.  please keep a record of the readings and bring it to your next appointment here (or you can bring the meter itself).  You can write it on any piece of paper.  please call us sooner if your blood sugar goes below 70, or if you have a lot of readings over 200.   Please reduce the metformin to 500 mg per day.   Please continue the same repaglinide.  Please come back for a follow-up appointment in 3 months.

## 2016-12-12 ENCOUNTER — Encounter: Payer: Self-pay | Admitting: Nurse Practitioner

## 2016-12-12 ENCOUNTER — Other Ambulatory Visit (INDEPENDENT_AMBULATORY_CARE_PROVIDER_SITE_OTHER): Payer: Medicare Other

## 2016-12-12 ENCOUNTER — Ambulatory Visit (INDEPENDENT_AMBULATORY_CARE_PROVIDER_SITE_OTHER): Payer: Medicare Other | Admitting: Nurse Practitioner

## 2016-12-12 VITALS — BP 142/80 | HR 60 | Temp 97.8°F | Resp 16 | Ht 63.0 in | Wt 141.0 lb

## 2016-12-12 DIAGNOSIS — I10 Essential (primary) hypertension: Secondary | ICD-10-CM | POA: Diagnosis not present

## 2016-12-12 DIAGNOSIS — R011 Cardiac murmur, unspecified: Secondary | ICD-10-CM | POA: Diagnosis not present

## 2016-12-12 LAB — BASIC METABOLIC PANEL
BUN: 59 mg/dL — AB (ref 6–23)
CHLORIDE: 98 meq/L (ref 96–112)
CO2: 30 meq/L (ref 19–32)
CREATININE: 1.88 mg/dL — AB (ref 0.40–1.20)
Calcium: 9.2 mg/dL (ref 8.4–10.5)
GFR: 34.34 mL/min — ABNORMAL LOW (ref >60.00)
Glucose, Bld: 105 mg/dL — ABNORMAL HIGH (ref 70–99)
Potassium: 3.8 mEq/L (ref 3.5–5.1)
Sodium: 135 mEq/L (ref 135–145)

## 2016-12-12 NOTE — Assessment & Plan Note (Addendum)
Readings below goal of 150/90 at home and in office today. Maintained on metoprolol, hyzaar, clonidine, amlodipine. Reports medication complaince without adverse medication reactions. Healthy diet and exercise discussed BMET today. Will continue medications at current dosages.

## 2016-12-12 NOTE — Assessment & Plan Note (Signed)
Murmur on exam today. No chest pain, shortness of breath, or edema. Pertinent diagnostic testing and notes reviewed. Last echo in December 2016 with EF 88-41%, grade 1 diastolic dysfunction, no recommendation for follow up. Will continue to monitor.

## 2016-12-12 NOTE — Patient Instructions (Signed)
Please head downstairs for lab work.  Remember healthy diet and exercise can help your blood sugar and blood pressure. At every meal, half of your plate should be veggies, one-fourth carbs, one-fourth meat, and don't eat meat at every meal. Also, remember to stay away from sugary drinks.  Id like to see you back in 6 months, or sooner if you need me.  It was so nice to meet you. Thanks for letting me take care of you today :)

## 2016-12-12 NOTE — Progress Notes (Signed)
Subjective:    Patient ID: [name], female    DOB: Jul 17, 1950, 66 y.o.   MRN: [id number]  HPI [name] is a 66 yo female Who presents today to establish care. She is transferring to me from another provider in the same clinic.  Hypertension- maintained on metoprolol, hyzaar, clonidine, amlodipine. Reports daily medication compliance. Reports normal blood pressure readings at home- has only been checking BP occasionally due to broken blood pressure machine at home. She denies elevated readings, headaches, vision changes, chest pain, shortness of breath, edema.  BP Readings from Last 3 Encounters:  12/12/16 (!) 142/80  12/05/16 116/70  10/13/16 (!) 124/47    Review of Systems  See HPI  Past Medical History:  Diagnosis Date  . Chronic back pain   . Cough   . Diabetes mellitus, type 2 ([place])   . Fibroid    patient thinks this was the reason for her hysterectomy  . GERD (gastroesophageal reflux disease)   . History of sebaceous cyst   . Hyperlipemia   . Hypertension   . Hyperthyroidism   . Lumbar radiculopathy   . Shortness of breath 09/13/2013  . Stroke ([place])   . Tobacco abuse      Social History   Socioeconomic History  . Marital status: Divorced    Spouse name: Not on file  . Number of children: 1  . Years of education: 75  . Highest education level: Not on file  Social Needs  . Financial resource strain: Not on file  . Food insecurity - worry: Not on file  . Food insecurity - inability: Not on file  . Transportation needs - medical: Not on file  . Transportation needs - non-medical: Not on file  Occupational History  . Occupation: Insurance account manager: [employer]  Tobacco Use  . Smoking status: Former Smoker    Packs/day: 0.50    Years: 35.00    Pack years: 17.50    Types: Cigarettes    Last attempt to quit: 07/07/2014    Years since quitting: 2.4  . Smokeless tobacco: Never Used  . Tobacco comment: patient advised to keep diary,  develop strategy and stop  Substance and Sexual Activity  . Alcohol use: No  . Drug use: No  . Sexual activity: No    Partners: Male    Birth control/protection: Surgical    Comment: TAH--?ovaries remain  Other Topics Concern  . Not on file  Social History Narrative   Denies abuse and feels safe at home.    Past Surgical History:  Procedure Laterality Date  . ABDOMINAL HYSTERECTOMY     --?ovaries remain  . RETINOPATHY SURGERY  2013   Dr. Zigmund Daniel    Family History  Problem Relation Age of Onset  . Hypertension Mother   . Sleep apnea Mother   . Diabetes Mother   . Hyperlipidemia Mother   . Cancer Father        lung cancer - smoker  . Hypertension Sister   . Diabetes Sister   . Hypertension Brother   . Hypertension Sister   . Diabetes Brother   . Hypertension Brother   . Breast cancer Neg Hx     No Known Allergies  Current Outpatient Medications on File Prior to Visit  Medication Sig Dispense Refill  . amLODipine (NORVASC) 10 MG tablet Take 1 tablet (10 mg total) by mouth daily. 90 tablet 3  . aspirin 325 MG tablet Take 325 mg by  mouth daily.    [name] atorvastatin (LIPITOR) 80 MG tablet TAKE 1 TABLET EVERY DAY 90 tablet 1  . cloNIDine (CATAPRES) 0.2 MG tablet Take 1 tablet (0.2 mg total) by mouth 3 (three) times daily. 90 tablet 0  . furosemide (LASIX) 40 MG tablet Take 40 mg by mouth daily.    [name] losartan-hydrochlorothiazide (HYZAAR) 100-25 MG tablet TAKE 1 TABLET BY MOUTH DAILY. 90 tablet 1  . metFORMIN (GLUCOPHAGE-XR) 500 MG 24 hr tablet Take 1 tablet (500 mg total) daily with breakfast by mouth. 90 tablet 3  . metoprolol succinate (TOPROL-XL) 100 MG 24 hr tablet Take 1 tablet (100 mg total) by mouth daily. Take with or immediately following a meal. 30 tablet 1  . minoxidil (LONITEN) 2.5 MG tablet Take 2.5 mg by mouth 2 (two) times daily.    [name] test strip USE AS INSTRUCTED (TESTS 2 TIMES A DAY) 100 each 7  . repaglinide (PRANDIN) 2 MG tablet Take 1  tablet (2 mg total) by mouth 3 (three) times daily before meals. 100 tablet 11   No current facility-administered medications on file prior to visit.     BP (!) 142/80 (BP Location: Left Arm, Patient Position: Sitting, Cuff Size: Normal)   Pulse 60   Temp 97.8 F (36.6 C) (Oral)   Resp 16   Ht 5\' 3"  (1.6 m)   Wt 141 lb (64 kg)   SpO2 98%   BMI 24.98 kg/m       Objective:   Physical Exam  Constitutional: She is oriented to person, place, and time. She appears well-developed and well-nourished. No distress.  HENT:  Head: Normocephalic and atraumatic.  Cardiovascular: Normal rate, regular rhythm and intact distal pulses.  Murmur heard. Pulmonary/Chest: Effort normal and breath sounds normal.  Neurological: She is alert and oriented to person, place, and time. Coordination normal.  Skin: Skin is warm and dry.  Psychiatric: She has a normal mood and affect. Judgment and thought content normal.      Assessment & Plan:  She is followed by Dr Buelah Manis, nephrology for CKD and and Dr Loanne Drilling, endocrinology for diabetes with routine appointments. Shell return in 6 months for f/u of hypertension, or sooner if needed.

## 2016-12-23 ENCOUNTER — Other Ambulatory Visit: Payer: Self-pay | Admitting: Family

## 2016-12-26 ENCOUNTER — Other Ambulatory Visit: Payer: Self-pay | Admitting: Family

## 2017-01-05 DIAGNOSIS — D631 Anemia in chronic kidney disease: Secondary | ICD-10-CM | POA: Diagnosis not present

## 2017-01-05 DIAGNOSIS — N183 Chronic kidney disease, stage 3 (moderate): Secondary | ICD-10-CM | POA: Diagnosis not present

## 2017-01-05 DIAGNOSIS — I10 Essential (primary) hypertension: Secondary | ICD-10-CM | POA: Diagnosis not present

## 2017-01-05 DIAGNOSIS — R809 Proteinuria, unspecified: Secondary | ICD-10-CM | POA: Diagnosis not present

## 2017-01-05 DIAGNOSIS — N2581 Secondary hyperparathyroidism of renal origin: Secondary | ICD-10-CM | POA: Diagnosis not present

## 2017-01-05 DIAGNOSIS — E1122 Type 2 diabetes mellitus with diabetic chronic kidney disease: Secondary | ICD-10-CM | POA: Diagnosis not present

## 2017-01-06 ENCOUNTER — Other Ambulatory Visit: Payer: Self-pay | Admitting: Nurse Practitioner

## 2017-01-06 DIAGNOSIS — E349 Endocrine disorder, unspecified: Secondary | ICD-10-CM | POA: Diagnosis not present

## 2017-01-06 DIAGNOSIS — N2581 Secondary hyperparathyroidism of renal origin: Secondary | ICD-10-CM | POA: Diagnosis not present

## 2017-01-16 ENCOUNTER — Ambulatory Visit: Payer: Medicare Other | Admitting: Podiatry

## 2017-01-23 ENCOUNTER — Ambulatory Visit: Payer: Medicare Other | Admitting: Podiatry

## 2017-01-26 ENCOUNTER — Other Ambulatory Visit: Payer: Self-pay

## 2017-01-26 MED ORDER — LOSARTAN POTASSIUM-HCTZ 100-25 MG PO TABS: 1.0000 | ORAL_TABLET | Freq: Every day | ORAL | 1 refills | Status: DC

## 2017-02-07 ENCOUNTER — Ambulatory Visit: Payer: Medicare Other | Admitting: Podiatry

## 2017-02-07 ENCOUNTER — Encounter: Payer: Self-pay | Admitting: Podiatry

## 2017-02-07 DIAGNOSIS — E1151 Type 2 diabetes mellitus with diabetic peripheral angiopathy without gangrene: Secondary | ICD-10-CM | POA: Diagnosis not present

## 2017-02-07 DIAGNOSIS — M79675 Pain in left toe(s): Secondary | ICD-10-CM

## 2017-02-07 DIAGNOSIS — B351 Tinea unguium: Secondary | ICD-10-CM | POA: Diagnosis not present

## 2017-02-07 DIAGNOSIS — M79674 Pain in right toe(s): Secondary | ICD-10-CM

## 2017-02-07 NOTE — Progress Notes (Signed)
Subjective: 67 y.o. returns the office today for painful, elongated, thickened toenails which she cannot trim herself. Denies any redness or drainage around the nails. Denies any acute changes since last appointment and no new complaints today. Denies any systemic complaints such as fevers, chills, nausea, vomiting.   PCP: Lance Sell, NP  Objective: AAO 3, NAD DP/PT pulses palpable 1/4, CRT less than 3 seconds Nails hypertrophic, dystrophic, elongated, brittle, discolored 10. There is tenderness overlying the nails 1-5 bilaterally. There is no surrounding erythema or drainage along the nail sites. No open lesions or pre-ulcerative lesions are identified. No other areas of tenderness bilateral lower extremities. No overlying edema, erythema, increased warmth. No pain with calf compression, swelling, warmth, erythema.  Assessment: Patient presents with symptomatic onychomycosis  Plan: -Treatment options including alternatives, risks, complications were discussed -Nails sharply debrided 10 without complication/bleeding. -Discussed daily foot inspection. If there are any changes, to call the office immediately.  -Follow-up in 4 months (at her request) or sooner if any problems are to arise. In the meantime, encouraged to call the office with any questions, concerns, changes symptoms.  Celesta Gentile, DPM

## 2017-02-24 ENCOUNTER — Ambulatory Visit (INDEPENDENT_AMBULATORY_CARE_PROVIDER_SITE_OTHER): Payer: Medicare Other | Admitting: Family

## 2017-02-24 ENCOUNTER — Encounter: Payer: Self-pay | Admitting: Family

## 2017-02-24 ENCOUNTER — Other Ambulatory Visit (INDEPENDENT_AMBULATORY_CARE_PROVIDER_SITE_OTHER): Payer: Medicare Other

## 2017-02-24 VITALS — BP 138/80 | HR 68 | Temp 98.2°F | Ht 63.0 in | Wt 144.0 lb

## 2017-02-24 DIAGNOSIS — M7021 Olecranon bursitis, right elbow: Secondary | ICD-10-CM

## 2017-02-24 LAB — CBC WITH DIFFERENTIAL/PLATELET
BASOS ABS: 0 10*3/uL (ref 0.0–0.1)
BASOS PCT: 0.6 % (ref 0.0–3.0)
EOS ABS: 0.2 10*3/uL (ref 0.0–0.7)
Eosinophils Relative: 5.5 % — ABNORMAL HIGH (ref 0.0–5.0)
HEMATOCRIT: 29.9 % — AB (ref 36.0–46.0)
HEMOGLOBIN: 10.1 g/dL — AB (ref 12.0–15.0)
LYMPHS PCT: 40.8 % (ref 12.0–46.0)
Lymphs Abs: 1.8 10*3/uL (ref 0.7–4.0)
MCHC: 34 g/dL (ref 30.0–36.0)
MCV: 91.3 fl (ref 78.0–100.0)
MONO ABS: 0.5 10*3/uL (ref 0.1–1.0)
Monocytes Relative: 12 % (ref 3.0–12.0)
Neutro Abs: 1.8 10*3/uL (ref 1.4–7.7)
Neutrophils Relative %: 41.1 % — ABNORMAL LOW (ref 43.0–77.0)
Platelets: 200 10*3/uL (ref 150.0–400.0)
RBC: 3.27 Mil/uL — AB (ref 3.87–5.11)
RDW: 12.6 % (ref 11.5–15.5)
WBC: 4.5 10*3/uL (ref 4.0–10.5)

## 2017-02-24 LAB — URIC ACID: URIC ACID, SERUM: 10 mg/dL — AB (ref 2.4–7.0)

## 2017-02-24 NOTE — Patient Instructions (Signed)
Elbow Bursitis Rehab Ask your health care provider which exercises are safe for you. Do exercises exactly as told by your health care provider and adjust them as directed. It is normal to feel mild stretching, pulling, tightness, or discomfort as you do these exercises, but you should stop right away if you feel sudden pain or your pain gets worse. Do not begin these exercises until told by your health care provider. Stretching and range of motion exercises These exercises warm up your muscles and joints and improve the movement and flexibility of your elbow. These exercises also help to relieve pain and swelling. Exercise A: Passive elbow flexion  1. Lie on your back. 2. Extend your left / right arm up into the air, bracing it with your other hand. Allow your left / right arm to relax. 3. Let your left / right elbow bend, allowing your hand to fall slowly toward your chest. You should feel a gentle stretch along the back of your upper arm and elbow. 4. If told by your health care provider, hold a __________ hand weight to increase the intensity of this stretch. 5. Hold this position for __________ seconds. 6. Slowly return your left / right arm to the upright position. Repeat __________ times. Complete this exercise __________ times a day. Exercise B: Passive elbow extension  1. Lie on your back on a firm bed. Make sure that you are in a comfortable position that allows you relax your arm muscles. 2. Place a folded towel under your left / right upper arm so that your elbow and shoulder are at the same height. 3. Extend your left / right arm so your elbow and hand do not rest on the bed or towel. 4. Let the weight of your hand straighten your elbow. Keep your arm and chest muscles relaxed. You should feel a stretch on the inside of your elbow. 5. If told by your health care provider, increase the intensity of your stretch by adding a small wrist weight or hand weight. 6. Hold this position for  __________ seconds. 7. Slowly return to the starting position. Repeat __________ times. Complete this exercise __________ times a day. Strengthening exercises These exercises build strength and endurance in your elbow. Endurance is the ability to use your muscles for a long time, even after they get tired. Exercise C: Elbow extensors, isometric  1. Stand or sit upright on a firm surface. 2. Place your left / right arm so your palm faces your abdomen, at the height of your waist. 3. Place your other hand on the underside of your forearm. Slowly push your left / right arm down, like you were going to straighten your elbow. Resist with your other hand. Push as hard as you can without causing any pain or movement at your left / right elbow. 4. Hold this position for __________ seconds. 5. Slowly release the tension in both arms. 6. Let your muscles relax completely before repeating. Repeat __________ times. Complete this exercise __________ times a day. Exercise D: Elbow flexors, isometric 1. Stand or sit upright on a firm surface. 2. Place your left / right arm so your hand is palm-up, at the height of your waist. 3. Place your other hand on top of your forearm. Gently push up with your left / right arm, like you were going to bend your elbow. Resist this motion with your other hand. Push as hard as you can without causing any pain or movement at your left / right  elbow. 4. Hold this position for __________ seconds. 5. Slowly release the tension in both arms. 6. Let your muscles relax completely before repeating. Repeat __________ times. Complete this exercise __________ times a day. This information is not intended to replace advice given to you by your health care provider. Make sure you discuss any questions you have with your health care provider. Document Released: 01/10/2005 Document Revised: 09/15/2015 Document Reviewed: 10/09/2014 Elsevier Interactive Patient Education  2018 [employer]. Elbow Bursitis A bursa is a fluid-filled sac that covers and protects a joint. Bursitis is when the fluid-filled sac gets puffy and sore (inflamed). Elbow bursitis, also called olecranon bursitis, happens over your elbow. This may be caused by:  Injury (acute trauma) to your elbow.  Leaning on hard surfaces for long periods of time.  Infection from an injury that breaks the skin near your elbow.  A bone growth (spur) that forms at the tip of your elbow.  A medical condition that causes inflammation in your body, such as: ? Gout. ? Rheumatoid arthritis.  Sometimes the cause is not known. Follow these instructions at home:  Take medicines only as told by your doctor.  If you were prescribed an antibiotic medicine, finish all of it even if you start to feel better.  If your bursitis is caused by an injury, rest your elbow and wear your bandage as told by your doctor. You may also apply ice to the injured area as told by your doctor: ? Put ice in a plastic bag. ? Place a towel between your skin and the bag. ? Leave the ice on for 20 minutes, 2-3 times per day.  Do not do any activities that cause pain to your elbow.  Use elbow pads or wraps to cushion your elbow. Contact a doctor if:  You have a fever.  Your symptoms do not get better with treatment.  Your pain or swelling gets worse.  Your pain or swelling goes away and then comes back.  You have drainage of pus from the swollen area over your elbow. This information is not intended to replace advice given to you by your health care provider. Make sure you discuss any questions you have with your health care provider. Document Released: 06/30/2009 Document Revised: 06/18/2015 Document Reviewed: 09/18/2013 Elsevier Interactive Patient Education  [employer].

## 2017-02-24 NOTE — Progress Notes (Signed)
[name] is a 67 y.o. female with the following history as recorded in [facility]:  Patient Active Problem List   Diagnosis Date Noted  . Thyroid nodule 06/29/2016  . Type 2 diabetes mellitus ([place]) 06/13/2016  . Vitamin D deficiency 01/12/2016  . Medicare annual wellness visit, initial 01/11/2016  . Lacunar infarct, acute 03/23/2015  . Non compliance w medication regimen 01/16/2015  . Anemia 01/08/2015  . Acute kidney injury ([place]) 01/08/2015  . Stroke ([place]) 01/08/2015  . Hyperlipemia   . Carotid bruit present 12/13/2012  . Routine health maintenance 10/24/2010  . PERIPHERAL EDEMA 09/10/2009  . COUGH 04/03/2009  . ELECTROCARDIOGRAM, ABNORMAL 04/11/2008  . HEART MURMUR, SYSTOLIC 08/65/7846  . SEBACEOUS CYST, NECK 12/13/2006  . Hyperthyroidism 09/27/2006  . Secondary diabetes with peripheral vascular disease ([place]) 09/27/2006  . Hyperlipidemia LDL goal <70 09/27/2006  . TOBACCO ABUSE 09/27/2006  . Essential hypertension 09/27/2006    Current Outpatient Medications  Medication Sig Dispense Refill  . amLODipine (NORVASC) 10 MG tablet Take 1 tablet (10 mg total) by mouth daily. 90 tablet 3  . aspirin 325 MG tablet Take 325 mg by mouth daily.    [name] atorvastatin (LIPITOR) 80 MG tablet TAKE 1 TABLET EVERY DAY 90 tablet 1  . cloNIDine (CATAPRES) 0.2 MG tablet Take 1 tablet (0.2 mg total) by mouth 3 (three) times daily. 90 tablet 0  . furosemide (LASIX) 40 MG tablet Take 40 mg by mouth daily.    [name] losartan-hydrochlorothiazide (HYZAAR) 100-25 MG tablet Take 1 tablet by mouth daily. 90 tablet 1  . metFORMIN (GLUCOPHAGE-XR) 500 MG 24 hr tablet Take 1 tablet (500 mg total) daily with breakfast by mouth. 90 tablet 3  . metoprolol succinate (TOPROL-XL) 100 MG 24 hr tablet Take 1 tablet (100 mg total) by mouth daily. Take with or immediately following a meal. 30 tablet 1  . minoxidil (LONITEN) 2.5 MG tablet Take 2.5 mg by mouth 2 (two) times daily.    [name] test strip USE AS  INSTRUCTED (TESTS 2 TIMES A DAY) 100 each 7  . repaglinide (PRANDIN) 2 MG tablet Take 1 tablet (2 mg total) by mouth 3 (three) times daily before meals. 100 tablet 11   No current facility-administered medications for this visit.     Allergies: Patient has no known allergies.  Past Medical History:  Diagnosis Date  . Chronic back pain   . Cough   . Diabetes mellitus, type 2 ([place])   . Fibroid    patient thinks this was the reason for her hysterectomy  . GERD (gastroesophageal reflux disease)   . History of sebaceous cyst   . Hyperlipemia   . Hypertension   . Hyperthyroidism   . Lumbar radiculopathy   . Shortness of breath 09/13/2013  . Stroke ([place])   . Tobacco abuse     Past Surgical History:  Procedure Laterality Date  . ABDOMINAL HYSTERECTOMY     --?ovaries remain  . RETINOPATHY SURGERY  2013   Dr. Zigmund Daniel    Family History  Problem Relation Age of Onset  . Hypertension Mother   . Sleep apnea Mother   . Diabetes Mother   . Hyperlipidemia Mother   . Cancer Father        lung cancer - smoker  . Hypertension Sister   . Diabetes Sister   . Hypertension Brother   . Hypertension Sister   . Diabetes Brother   . Hypertension Brother   . Breast cancer Neg Hx  Social History   Tobacco Use  . Smoking status: Former Smoker    Packs/day: 0.50    Years: 35.00    Pack years: 17.50    Types: Cigarettes    Last attempt to quit: 07/07/2014    Years since quitting: 2.6  . Smokeless tobacco: Never Used  . Tobacco comment: patient advised to keep diary, develop strategy and stop  Substance Use Topics  . Alcohol use: No    Subjective:  3 week history of "lump" on right elbow; denies any pain, numbness or tingling; does not remember injuring the elbow; due to chronic kidney disease, patient cannot take NSAIDs; no known prior history of gout; denies any other concerns today;    Objective:  Vitals:   02/24/17 0902  BP: 138/80  Pulse: 68  Temp: 98.2 F (36.8 C)   TempSrc: Oral  SpO2: 99%  Weight: 144 lb (65.3 kg)  Height: 5\' 3"  (1.6 m)    General: Well developed, well nourished, in no acute distress  Skin : Warm and dry.  Head: Normocephalic and atraumatic  Lungs: Respirations unlabored; clear to auscultation bilaterally without wheeze, rales, rhonchi  Musculoskeletal: bursitis noted over right elbow; FROM on active and passive motion; no warmth or streaking noted;  Extremities: No edema, cyanosis, clubbing  Vessels: Symmetric bilaterally  Neurologic: Alert and oriented; speech intact; face symmetrical; moves all extremities well; CNII-XII intact without focal deficit  Assessment:  1. Olecranon bursitis of right elbow     Plan:  Reassurance provided regarding the condition; she is not a candidate for NSAIDs due to kidney disease; she is encouraged to use ice; I recommended follow-up with sports medicine for treatment but she defers at this time; will update CBC, uric acid today; could consider prednisone if needed; stretching exercises discussed also. Follow-up worse, no better.    No Follow-up on file.  Orders Placed This Encounter  Procedures  . Uric acid    Standing Status:   Future    Number of Occurrences:   1    Standing Expiration Date:   02/24/2018  . CBC w/Diff    Standing Status:   Future    Number of Occurrences:   1    Standing Expiration Date:   02/24/2018    Requested Prescriptions    No prescriptions requested or ordered in this encounter

## 2017-02-27 ENCOUNTER — Other Ambulatory Visit: Payer: Self-pay | Admitting: Family

## 2017-02-27 DIAGNOSIS — Z139 Encounter for screening, unspecified: Secondary | ICD-10-CM

## 2017-03-02 ENCOUNTER — Ambulatory Visit: Payer: Medicare Other | Admitting: Family Medicine

## 2017-03-02 ENCOUNTER — Other Ambulatory Visit: Payer: Self-pay | Admitting: Nurse Practitioner

## 2017-03-02 DIAGNOSIS — I10 Essential (primary) hypertension: Secondary | ICD-10-CM

## 2017-03-07 ENCOUNTER — Encounter: Payer: Self-pay | Admitting: Endocrinology

## 2017-03-07 ENCOUNTER — Ambulatory Visit: Payer: Medicare Other | Admitting: Endocrinology

## 2017-03-07 VITALS — BP 162/84 | HR 59 | Wt 146.8 lb

## 2017-03-07 DIAGNOSIS — E1122 Type 2 diabetes mellitus with diabetic chronic kidney disease: Secondary | ICD-10-CM | POA: Diagnosis not present

## 2017-03-07 DIAGNOSIS — N183 Chronic kidney disease, stage 3 unspecified: Secondary | ICD-10-CM

## 2017-03-07 LAB — POCT GLYCOSYLATED HEMOGLOBIN (HGB A1C): Hemoglobin A1C: 6.7

## 2017-03-07 MED ORDER — REPAGLINIDE 2 MG PO TABS: 1.0000 mg | ORAL_TABLET | Freq: Three times a day (TID) | ORAL | 11 refills | Status: DC

## 2017-03-07 NOTE — Progress Notes (Signed)
Subjective:    Patient ID: [name], female    DOB: [birth year], 67 y.o.   MRN: [id number]  HPI Pt returns for f/u of diabetes mellitus:  DM type: 2, but she may be developing type 1 Dx'ed: 9449 Complications: polyneuropathy, renal insuff, CVA's, and PDR Therapy: 2 oral meds GDM: never DKA: never Severe hypoglycemia: never.   Pancreatitis: never Pancreatic imaging: never Other: she cannot afford brand-name meds; she has never been on insulin, but she has taken trulicity; renal insuff limits metformin dosage; edema limits rx options.   Interval history:   no cbg record, but states cbg's are well-controlled.  She takes repaglinide just 1/2 tab 3 times a day (just before each meal).   pt states she feels well in general.   Past Medical History:  Diagnosis Date  . Chronic back pain   . Cough   . Diabetes mellitus, type 2 ([place])   . Fibroid    patient thinks this was the reason for her hysterectomy  . GERD (gastroesophageal reflux disease)   . History of sebaceous cyst   . Hyperlipemia   . Hypertension   . Hyperthyroidism   . Lumbar radiculopathy   . Shortness of breath 09/13/2013  . Stroke ([place])   . Tobacco abuse     Past Surgical History:  Procedure Laterality Date  . ABDOMINAL HYSTERECTOMY     --?ovaries remain  . RETINOPATHY SURGERY  2013   Dr. Zigmund Daniel    Social History   Socioeconomic History  . Marital status: Divorced    Spouse name: Not on file  . Number of children: 1  . Years of education: 21  . Highest education level: Not on file  Social Needs  . Financial resource strain: Not on file  . Food insecurity - worry: Not on file  . Food insecurity - inability: Not on file  . Transportation needs - medical: Not on file  . Transportation needs - non-medical: Not on file  Occupational History  . Occupation: Insurance account manager: [employer]  Tobacco Use  . Smoking status: Former Smoker    Packs/day: 0.50    Years: 35.00    Pack  years: 17.50    Types: Cigarettes    Last attempt to quit: 07/07/2014    Years since quitting: 2.6  . Smokeless tobacco: Never Used  . Tobacco comment: patient advised to keep diary, develop strategy and stop  Substance and Sexual Activity  . Alcohol use: No  . Drug use: No  . Sexual activity: No    Partners: Male    Birth control/protection: Surgical    Comment: TAH--?ovaries remain  Other Topics Concern  . Not on file  Social History Narrative   Denies abuse and feels safe at home.    Current Outpatient Medications on File Prior to Visit  Medication Sig Dispense Refill  . amLODipine (NORVASC) 10 MG tablet Take 1 tablet (10 mg total) by mouth daily. 90 tablet 3  . aspirin 325 MG tablet Take 325 mg by mouth daily.    [name] atorvastatin (LIPITOR) 80 MG tablet TAKE 1 TABLET EVERY DAY 90 tablet 1  . cloNIDine (CATAPRES) 0.2 MG tablet Take 1 tablet (0.2 mg total) by mouth 3 (three) times daily. 90 tablet 0  . furosemide (LASIX) 40 MG tablet Take 40 mg by mouth daily.    [name] losartan-hydrochlorothiazide (HYZAAR) 100-25 MG tablet Take 1 tablet by mouth daily. 90 tablet 1  . metFORMIN (GLUCOPHAGE-XR) 500  MG 24 hr tablet Take 1 tablet (500 mg total) daily with breakfast by mouth. 90 tablet 3  . metoprolol succinate (TOPROL-XL) 100 MG 24 hr tablet TAKE 1 TABLET BY MOUTH DAILY FOR HIGH BLOOD PRESSURE 90 tablet 3  . minoxidil (LONITEN) 2.5 MG tablet Take 2.5 mg by mouth 2 (two) times daily.    [name] test strip USE AS INSTRUCTED (TESTS 2 TIMES A DAY) 100 each 7   No current facility-administered medications on file prior to visit.     No Known Allergies  Family History  Problem Relation Age of Onset  . Hypertension Mother   . Sleep apnea Mother   . Diabetes Mother   . Hyperlipidemia Mother   . Cancer Father        lung cancer - smoker  . Hypertension Sister   . Diabetes Sister   . Hypertension Brother   . Hypertension Sister   . Diabetes Brother   . Hypertension Brother   .  Breast cancer Neg Hx     BP (!) 162/84 (BP Location: Left Arm, Patient Position: Sitting, Cuff Size: Normal)   Pulse (!) 59   Wt 146 lb 12.8 oz (66.6 kg)   SpO2 98%   BMI 26.00 kg/m    Review of Systems She denies hypoglycemia.     Objective:   Physical Exam VITAL SIGNS:  See vs page GENERAL: no distress Pulses: foot pulses are intact bilaterally.   MSK: no deformity of the feet or ankles.  CV: trace bilat edema of the legs. Skin:  no ulcer on the feet or ankles, but the skin is dry.  normal color and temp on the feet and ankles Neuro: sensation is intact to touch on the feet and ankles  Lab Results  Component Value Date   HGBA1C 6.7 03/07/2017      Assessment & Plan:  Type 2 DM: well-controlled.  Renal insuff: this limits rx options and dosages.    Patient Instructions  Your blood pressure is high today.  Please see your primary care provider soon, to have it rechecked check your blood sugar once a day.  vary the time of day when you check, between before the 3 meals, and at bedtime.  also check if you have symptoms of your blood sugar being too high or too low.  please keep a record of the readings and bring it to your next appointment here (or you can bring the meter itself).  You can write it on any piece of paper.  please call us sooner if your blood sugar goes below 70, or if you have a lot of readings over 200.   Please continue the same medications.  Please come back for a follow-up appointment in 6 months.

## 2017-03-07 NOTE — Patient Instructions (Addendum)
Your blood pressure is high today.  Please see your primary care provider soon, to have it rechecked check your blood sugar once a day.  vary the time of day when you check, between before the 3 meals, and at bedtime.  also check if you have symptoms of your blood sugar being too high or too low.  please keep a record of the readings and bring it to your next appointment here (or you can bring the meter itself).  You can write it on any piece of paper.  please call us sooner if your blood sugar goes below 70, or if you have a lot of readings over 200.   Please continue the same medications.  Please come back for a follow-up appointment in 6 months.

## 2017-03-09 ENCOUNTER — Encounter: Payer: Self-pay | Admitting: Family Medicine

## 2017-03-09 ENCOUNTER — Ambulatory Visit (INDEPENDENT_AMBULATORY_CARE_PROVIDER_SITE_OTHER): Payer: Medicare Other | Admitting: Family Medicine

## 2017-03-09 VITALS — BP 130/72 | HR 57 | Temp 98.0°F | Ht 63.0 in | Wt 142.0 lb

## 2017-03-09 DIAGNOSIS — M7021 Olecranon bursitis, right elbow: Secondary | ICD-10-CM | POA: Diagnosis not present

## 2017-03-09 NOTE — Progress Notes (Signed)
[name] - 67 y.o. female MRN [id number]  Date of birth: 1950-05-28  SUBJECTIVE:  Including CC & ROS.  Chief Complaint  Patient presents with  . Right elbow bursitis    [name] is a 67 y.o. female that is presenting with right elbow bursitis. She noticed swelling on her elbow one month ago. Denies trauma or injury. Denies prior surgeries. Denies tenderness. Does not have any pain in the elbow. Has to avoid anti-inflammatories based on her kidney function.  Review of her lab work from November 2018 shows an elevated creatinine of 1.8.  Review of the year casted from 02/24/17 shows an elevation at 10.   Review of Systems  Constitutional: Negative for fever.  Respiratory: Negative for cough.   Cardiovascular: Negative for chest pain.  Musculoskeletal: Negative for back pain.  Skin: Negative for color change.  Hematological: Negative for adenopathy.  Psychiatric/Behavioral: Negative for agitation.    HISTORY: Past Medical, Surgical, Social, and Family History Reviewed & Updated per EMR.   Pertinent Historical Findings include:  Past Medical History:  Diagnosis Date  . Chronic back pain   . Cough   . Diabetes mellitus, type 2 ([place])   . Fibroid    patient thinks this was the reason for her hysterectomy  . GERD (gastroesophageal reflux disease)   . History of sebaceous cyst   . Hyperlipemia   . Hypertension   . Hyperthyroidism   . Lumbar radiculopathy   . Shortness of breath 09/13/2013  . Stroke ([place])   . Tobacco abuse     Past Surgical History:  Procedure Laterality Date  . ABDOMINAL HYSTERECTOMY     --?ovaries remain  . RETINOPATHY SURGERY  2013   Dr. Zigmund Daniel    No Known Allergies  Family History  Problem Relation Age of Onset  . Hypertension Mother   . Sleep apnea Mother   . Diabetes Mother   . Hyperlipidemia Mother   . Cancer Father        lung cancer - smoker  . Hypertension Sister   . Diabetes Sister   . Hypertension Brother   .  Hypertension Sister   . Diabetes Brother   . Hypertension Brother   . Breast cancer Neg Hx      Social History   Socioeconomic History  . Marital status: Divorced    Spouse name: Not on file  . Number of children: 1  . Years of education: 45  . Highest education level: Not on file  Social Needs  . Financial resource strain: Not on file  . Food insecurity - worry: Not on file  . Food insecurity - inability: Not on file  . Transportation needs - medical: Not on file  . Transportation needs - non-medical: Not on file  Occupational History  . Occupation: Insurance account manager: [employer]  Tobacco Use  . Smoking status: Former Smoker    Packs/day: 0.50    Years: 35.00    Pack years: 17.50    Types: Cigarettes    Last attempt to quit: 07/07/2014    Years since quitting: 2.6  . Smokeless tobacco: Never Used  . Tobacco comment: patient advised to keep diary, develop strategy and stop  Substance and Sexual Activity  . Alcohol use: No  . Drug use: No  . Sexual activity: No    Partners: Male    Birth control/protection: Surgical    Comment: TAH--?ovaries remain  Other Topics Concern  . Not on file  Social History Narrative   Denies abuse and feels safe at home.     PHYSICAL EXAM:  VS: BP 130/72 (BP Location: Left Arm, Patient Position: Sitting, Cuff Size: Normal)   Pulse (!) 57   Temp 98 F (36.7 C) (Oral)   Ht 5\' 3"  (1.6 m)   Wt 142 lb (64.4 kg)   SpO2 99%   BMI 25.15 kg/m  Physical Exam Gen: NAD, alert, cooperative with exam, well-appearing ENT: normal lips, normal nasal mucosa,  Eye: normal EOM, normal conjunctiva and lids CV:  no edema, +2 pedal pulses   Resp: no accessory muscle use, non-labored,  GI: no masses or tenderness, no hernia  Skin: no rashes, no areas of induration  Neuro: normal tone, normal sensation to touch Psych:  normal insight, alert and oriented MSK:  Right elbow: Small olecranon bursa Nontender. Normal elbow range of  motion. Normal grip strength. Neurovascularly intact.  Limited ultrasound: Right elbow:  Smaller olecranon bursa present  Summary: Olecranon bursitis of the right elbow  Ultrasound and interpretation by Clearance Coots, MD        ASSESSMENT & PLAN:   Olecranon bursitis of right elbow Small amount of fluid within the bursa. No suggestion of infection - Counseled on compression and ice in today - Avoiding anti-inflammatories - If no improvement in 2-3 weeks then follow-up and can aspirate

## 2017-03-09 NOTE — Assessment & Plan Note (Signed)
Small amount of fluid within the bursa. No suggestion of infection - Counseled on compression and ice in today - Avoiding anti-inflammatories - If no improvement in 2-3 weeks then follow-up and can aspirate

## 2017-03-09 NOTE — Patient Instructions (Signed)
Keep the elbow wrapped with an Ace wrap course the day. Do not wear the compression at night. Rewrap the Ace wrap every couple of hours. You can apply ice to this area. If there is no improvement in 2-3 weeks and we can drain this area. Please only know if you develop any redness or fevers

## 2017-03-19 ENCOUNTER — Other Ambulatory Visit: Payer: Self-pay | Admitting: Nurse Practitioner

## 2017-03-21 ENCOUNTER — Other Ambulatory Visit: Payer: Self-pay | Admitting: Nurse Practitioner

## 2017-03-22 ENCOUNTER — Other Ambulatory Visit: Payer: Self-pay | Admitting: *Deleted

## 2017-03-22 MED ORDER — GLUCOSE BLOOD VI STRP: ORAL_STRIP | 5 refills | Status: DC

## 2017-03-22 NOTE — Telephone Encounter (Signed)
Patient checking status, please advise. Call back [phone]

## 2017-03-22 NOTE — Progress Notes (Signed)
Called pharmacy at [facility] on [address], to verify prescription for OneTouch Verio test strips. They stated the computers had gone done on the 25 th and did not get the prescription. Prescription sent to pharmacy again.

## 2017-04-05 DIAGNOSIS — D631 Anemia in chronic kidney disease: Secondary | ICD-10-CM | POA: Diagnosis not present

## 2017-04-05 DIAGNOSIS — N2581 Secondary hyperparathyroidism of renal origin: Secondary | ICD-10-CM | POA: Diagnosis not present

## 2017-04-05 DIAGNOSIS — N183 Chronic kidney disease, stage 3 (moderate): Secondary | ICD-10-CM | POA: Diagnosis not present

## 2017-04-05 DIAGNOSIS — I129 Hypertensive chronic kidney disease with stage 1 through stage 4 chronic kidney disease, or unspecified chronic kidney disease: Secondary | ICD-10-CM | POA: Diagnosis not present

## 2017-04-05 DIAGNOSIS — E1122 Type 2 diabetes mellitus with diabetic chronic kidney disease: Secondary | ICD-10-CM | POA: Diagnosis not present

## 2017-04-10 ENCOUNTER — Ambulatory Visit
Admission: RE | Admit: 2017-04-10 | Discharge: 2017-04-10 | Disposition: A | Discharge status: Home / Self Care | Payer: Medicare Other | Source: Ambulatory Visit | Attending: Family | Admitting: Family

## 2017-04-10 DIAGNOSIS — Z139 Encounter for screening, unspecified: Secondary | ICD-10-CM

## 2017-04-10 DIAGNOSIS — Z1231 Encounter for screening mammogram for malignant neoplasm of breast: Secondary | ICD-10-CM | POA: Diagnosis not present

## 2017-04-25 ENCOUNTER — Other Ambulatory Visit: Payer: Self-pay | Admitting: Family

## 2017-05-02 ENCOUNTER — Other Ambulatory Visit: Payer: Self-pay | Admitting: Nurse Practitioner

## 2017-05-11 ENCOUNTER — Ambulatory Visit: Payer: Medicare Other | Admitting: Nurse Practitioner

## 2017-05-26 ENCOUNTER — Other Ambulatory Visit: Payer: Self-pay | Admitting: Nurse Practitioner

## 2017-05-26 ENCOUNTER — Telehealth: Payer: Self-pay | Admitting: Nurse Practitioner

## 2017-05-26 NOTE — Telephone Encounter (Signed)
Spoke with [name] on 05/26/17. 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 Informed patient that she can drop paperwork off at the front desk and the office will follow up with her if she needs to make an appointment. SF

## 2017-06-08 ENCOUNTER — Ambulatory Visit: Payer: Medicare Other | Admitting: Podiatry

## 2017-06-08 ENCOUNTER — Encounter: Payer: Self-pay | Admitting: Podiatry

## 2017-06-08 DIAGNOSIS — B351 Tinea unguium: Secondary | ICD-10-CM | POA: Diagnosis not present

## 2017-06-08 DIAGNOSIS — M79675 Pain in left toe(s): Secondary | ICD-10-CM | POA: Diagnosis not present

## 2017-06-08 DIAGNOSIS — M79674 Pain in right toe(s): Secondary | ICD-10-CM | POA: Diagnosis not present

## 2017-06-08 DIAGNOSIS — E1151 Type 2 diabetes mellitus with diabetic peripheral angiopathy without gangrene: Secondary | ICD-10-CM

## 2017-06-08 NOTE — Progress Notes (Signed)
Subjective: 67 y.o. returns the office today for painful, elongated, thickened toenails which she cannot trim herself. Denies any redness or drainage around the nails. Denies any acute changes since last appointment and no new complaints today. Denies any systemic complaints such as fevers, chills, nausea, vomiting.   PCP: Lance Sell, NP  Objective: AAO 3, NAD DP/PT pulses palpable 1/4, CRT less than 3 seconds Nails hypertrophic, dystrophic, elongated, brittle, discolored 10. There is tenderness overlying the nails 1-5 bilaterally. There is no surrounding erythema or drainage along the nail sites. No open lesions or pre-ulcerative lesions are identified. No other areas of tenderness bilateral lower extremities. No overlying edema, erythema, increased warmth. No pain with calf compression, swelling, warmth, erythema.  Assessment: Patient presents with symptomatic onychomycosis  Plan: -Treatment options including alternatives, risks, complications were discussed -Nails sharply debrided 10 without complication/bleeding. -Discussed daily foot inspection. If there are any changes, to call the office immediately.  -Follow-up in 4 months (at her request) or sooner if any problems are to arise. In the meantime, encouraged to call the office with any questions, concerns, changes symptoms.  Celesta Gentile, DPM

## 2017-06-15 ENCOUNTER — Telehealth: Payer: Self-pay | Admitting: Nurse Practitioner

## 2017-06-15 ENCOUNTER — Other Ambulatory Visit: Payer: Self-pay | Admitting: Nurse Practitioner

## 2017-06-15 MED ORDER — TELMISARTAN-HCTZ 80-25 MG PO TABS: 1.0000 | ORAL_TABLET | Freq: Every day | ORAL | 1 refills | Status: DC

## 2017-06-15 NOTE — Telephone Encounter (Signed)
Please call her to let her know that I have sent a medication called micardis-HCT in place of her losartan/hctz per pharmacy request. Please have her come in 2 weeks for an OV so we can make sure she is doing okay

## 2017-06-18 ENCOUNTER — Other Ambulatory Visit: Payer: Self-pay | Admitting: Nurse Practitioner

## 2017-06-21 NOTE — Telephone Encounter (Signed)
Pt aware of medication change and has set up an appointment 2 weeks out to follow up.

## 2017-07-06 DIAGNOSIS — I129 Hypertensive chronic kidney disease with stage 1 through stage 4 chronic kidney disease, or unspecified chronic kidney disease: Secondary | ICD-10-CM | POA: Diagnosis not present

## 2017-07-06 DIAGNOSIS — E1122 Type 2 diabetes mellitus with diabetic chronic kidney disease: Secondary | ICD-10-CM | POA: Diagnosis not present

## 2017-07-06 DIAGNOSIS — N2581 Secondary hyperparathyroidism of renal origin: Secondary | ICD-10-CM | POA: Diagnosis not present

## 2017-07-06 DIAGNOSIS — N183 Chronic kidney disease, stage 3 (moderate): Secondary | ICD-10-CM | POA: Diagnosis not present

## 2017-07-06 DIAGNOSIS — D631 Anemia in chronic kidney disease: Secondary | ICD-10-CM | POA: Diagnosis not present

## 2017-07-07 ENCOUNTER — Ambulatory Visit (INDEPENDENT_AMBULATORY_CARE_PROVIDER_SITE_OTHER): Payer: Medicare Other | Admitting: *Deleted

## 2017-07-07 VITALS — BP 143/62 | HR 55 | Resp 18 | Ht 63.0 in | Wt 148.0 lb

## 2017-07-07 DIAGNOSIS — Z0000 Encounter for general adult medical examination without abnormal findings: Secondary | ICD-10-CM

## 2017-07-07 DIAGNOSIS — Z23 Encounter for immunization: Secondary | ICD-10-CM | POA: Diagnosis not present

## 2017-07-07 NOTE — Patient Instructions (Addendum)
Continue doing brain stimulating activities (puzzles, reading, adult coloring books, staying active) to keep memory sharp.   Continue to eat heart healthy diet (full of fruits, vegetables, whole grains, lean protein, water--limit salt, fat, and sugar intake) and increase physical activity as tolerated.   [name] , Thank you for taking time to come for your Medicare Wellness Visit. I appreciate your ongoing commitment to your health goals. Please review the following plan we discussed and let me know if I can assist you in the future.   These are the goals we discussed: Goals    . Patient Stated     I want to exercise and eat healthy for my diabetes and high blood pressure.       This is a list of the screening recommended for you and due dates:  Health Maintenance  Topic Date Due  . Eye exam for diabetics  06/24/2016  . Pneumonia vaccines (2 of 2 - PPSV23) 12/12/2016  . Flu Shot  08/24/2017  . Hemoglobin A1C  09/04/2017  . Complete foot exam   03/07/2018  . Tetanus Vaccine  09/10/2018  . Colon Cancer Screening  09/12/2018  . Mammogram  04/11/2019  . DEXA scan (bone density measurement)  Completed  .  Hepatitis C: One time screening is recommended by Center for Disease Control  (CDC) for  adults born from [birth year] through 1965.   Completed

## 2017-07-07 NOTE — Progress Notes (Signed)
Medical screening examination/treatment/procedure(s) were performed by non-physician practitioner and as supervising physician I was immediately available for consultation/collaboration. I agree with above. Demarqus Jocson A Seith Aikey, MD 

## 2017-07-07 NOTE — Progress Notes (Signed)
Subjective:   [name] is a 67 y.o. female who presents for Medicare Annual (Subsequent) preventive examination.  Review of Systems:  No ROS.  Medicare Wellness Visit. Additional risk factors are reflected in the social history.  Cardiac Risk Factors include: advanced age (>38men, >16 women);diabetes mellitus;dyslipidemia;hypertension Sleep patterns: gets up 1 times nightly to void and sleeps 7-8 hours nightly.    Home Safety/Smoke Alarms: Feels safe in home. Smoke alarms in place.  Living environment; residence and Firearm Safety: 1-story house/ trailer, no firearms. Lives alone, no needs for DME, good support system Seat Belt Safety/Bike Helmet: Wears seat belt.      Objective:     Vitals: BP (!) 143/62   Pulse (!) 55   Resp 18   Ht 5\' 3"  (1.6 m)   Wt 148 lb (67.1 kg)   SpO2 98%   BMI 26.22 kg/m   Body mass index is 26.22 kg/m.  Advanced Directives 07/07/2017 02/24/2016 03/07/2015 01/08/2015 01/08/2015 09/13/2013  Does Patient Have a Medical Advance Directive? No No No No No No  Would patient like information on creating a medical advance directive? Yes (ED - Information included in AVS) No - Patient declined No - patient declined information No - patient declined information No - patient declined information Yes - Scientist, clinical (histocompatibility and immunogenetics) given    Tobacco Social History   Tobacco Use  Smoking Status Former Smoker  . Packs/day: 0.50  . Years: 35.00  . Pack years: 17.50  . Types: Cigarettes  . Last attempt to quit: 07/07/2014  . Years since quitting: 3.0  Smokeless Tobacco Never Used  Tobacco Comment   patient advised to keep diary, develop strategy and stop     Counseling given: Not Answered Comment: patient advised to keep diary, develop strategy and stop  Past Medical History:  Diagnosis Date  . Chronic back pain   . Cough   . Diabetes mellitus, type 2 ([place])   . Fibroid    patient thinks this was the reason for her hysterectomy  . GERD  (gastroesophageal reflux disease)   . History of sebaceous cyst   . Hyperlipemia   . Hypertension   . Hyperthyroidism   . Lumbar radiculopathy   . Shortness of breath 09/13/2013  . Stroke ([place])   . Tobacco abuse    Past Surgical History:  Procedure Laterality Date  . ABDOMINAL HYSTERECTOMY     --?ovaries remain  . RETINOPATHY SURGERY  2013   Dr. Zigmund Daniel   Family History  Problem Relation Age of Onset  . Hypertension Mother   . Sleep apnea Mother   . Diabetes Mother   . Hyperlipidemia Mother   . Cancer Father        lung cancer - smoker  . Hypertension Sister   . Diabetes Sister   . Hypertension Brother   . Hypertension Sister   . Diabetes Brother   . Hypertension Brother   . Breast cancer Neg Hx    Social History   Socioeconomic History  . Marital status: Divorced    Spouse name: Not on file  . Number of children: 1  . Years of education: 61  . Highest education level: Not on file  Occupational History  . Occupation: Insurance account manager: [employer]  Social Needs  . Financial resource strain: Not hard at all  . Food insecurity:    Worry: Never true    Inability: Never true  . Transportation needs:    Medical: No  Non-medical: No  Tobacco Use  . Smoking status: Former Smoker    Packs/day: 0.50    Years: 35.00    Pack years: 17.50    Types: Cigarettes    Last attempt to quit: 07/07/2014    Years since quitting: 3.0  . Smokeless tobacco: Never Used  . Tobacco comment: patient advised to keep diary, develop strategy and stop  Substance and Sexual Activity  . Alcohol use: No  . Drug use: No  . Sexual activity: Never    Partners: Male    Birth control/protection: Surgical    Comment: TAH--?ovaries remain  Lifestyle  . Physical activity:    Days per week: 6 days    Minutes per session: 60 min  . Stress: Not at all  Relationships  . Social connections:    Talks on phone: More than three times a week    Gets together: More than three  times a week    Attends religious service: More than 4 times per year    Active member of club or organization: Yes    Attends meetings of clubs or organizations: More than 4 times per year    Relationship status: Divorced  Other Topics Concern  . Not on file  Social History Narrative   Denies abuse and feels safe at home.    Outpatient Encounter Medications as of 07/07/2017  Medication Sig  . amLODipine (NORVASC) 10 MG tablet Take 1 tablet (10 mg total) by mouth daily.  [name] aspirin 325 MG tablet Take 325 mg by mouth daily.  [name] atorvastatin (LIPITOR) 80 MG tablet TAKE 1 TABLET DAILY. FOLLOW-UP APPT IS DUE NEED LIPIDS CHECK MUST SEE PROVIDER FOR FUTURE REFILLS  . calcitRIOL (ROCALTROL) 0.25 MCG capsule 1 (ONE) CAPSULE THREE TIMES A WEEK  . cloNIDine (CATAPRES) 0.2 MG tablet Take 1 tablet (0.2 mg total) by mouth 3 (three) times daily.  . furosemide (LASIX) 40 MG tablet Take 40 mg by mouth daily.  [name] glucose blood (ONETOUCH VERIO) test strip USE AS INSTRUCTED (TESTS 2 TIMES A DAY)  . metFORMIN (GLUCOPHAGE-XR) 500 MG 24 hr tablet Take 1 tablet (500 mg total) daily with breakfast by mouth.  . metoprolol succinate (TOPROL-XL) 100 MG 24 hr tablet TAKE 1 TABLET BY MOUTH DAILY FOR HIGH BLOOD PRESSURE  . minoxidil (LONITEN) 2.5 MG tablet Take 2.5 mg by mouth 2 (two) times daily.  [name] test strip USE AS INSTRUCTED (TESTS 2 TIMES A DAY)  . repaglinide (PRANDIN) 2 MG tablet Take 0.5 tablets (1 mg total) by mouth 3 (three) times daily before meals.  [name] telmisartan-hydrochlorothiazide (MICARDIS HCT) 80-25 MG tablet Take 1 tablet by mouth daily.  . Vitamin D, Ergocalciferol, (DRISDOL) 50000 units CAPS capsule 1 (ONE) CAPSULE TWICE A WEEK   No facility-administered encounter medications on file as of 07/07/2017.     Activities of Daily Living In your present state of health, do you have any difficulty performing the following activities: 07/07/2017  Hearing? N  Vision? N  Difficulty  concentrating or making decisions? N  Walking or climbing stairs? N  Dressing or bathing? N  Doing errands, shopping? N  Preparing Food and eating ? N  Using the Toilet? N  In the past six months, have you accidently leaked urine? N  Do you have problems with loss of bowel control? N  Managing your Medications? N  Managing your Finances? N  Housekeeping or managing your Housekeeping? N  Some recent data might be hidden  Patient Care Team: Lance Sell, NP as PCP - General (Nurse Practitioner) Hayden Pedro, MD (Ophthalmology)    Assessment:   This is a routine wellness examination for [place]. Physical assessment deferred to PCP.   Exercise Activities and Dietary recommendations Current Exercise Habits: Home exercise routine, Type of exercise: walking, Time (Minutes): 60, Frequency (Times/Week): 6, Weekly Exercise (Minutes/Week): 360, Intensity: Mild, Exercise limited by: None identified  Diet (meal preparation, eat out, water intake, caffeinated beverages, dairy products, fruits and vegetables): in general, a "healthy" diet  , well balanced, eats a variety of fruits and vegetables daily, limits salt, fat/cholesterol, sugar,carbohydrates,caffeine, drinks 6-8 glasses of water daily.  Reviewed heart healthy and diabetic diet  Goals    . Patient Stated     I want to exercise and eat healthy for my diabetes and high blood pressure.       Fall Risk Fall Risk  07/07/2017 02/24/2017 01/11/2016 03/23/2015  Falls in the past year? No No No No    Depression Screen PHQ 2/9 Scores 07/07/2017 02/24/2017 01/11/2016  PHQ - 2 Score 0 0 0     Cognitive Function       Ad8 score reviewed for issues:  Issues making decisions: no  Less interest in hobbies / activities: no  Repeats questions, stories (family complaining): no  Trouble using ordinary gadgets (microwave, computer, phone):no  Forgets the month or year: no  Mismanaging finances: no  Remembering appts:  no  Daily problems with thinking and/or memory: no Ad8 score is= 0  Immunization History  Administered Date(s) Administered  . Influenza Whole 10/25/2011  . Influenza, High Dose Seasonal PF 01/11/2016, 12/05/2016  . Influenza,inj,Quad PF,6+ Mos 12/13/2012, 10/07/2014  . Pneumococcal Conjugate-13 08/28/2015  . Pneumococcal Polysaccharide-23 12/13/2011, 07/07/2017  . Td 09/09/2008  . Zoster 12/13/2012   Screening Tests Health Maintenance  Topic Date Due  . OPHTHALMOLOGY EXAM  06/24/2016  . INFLUENZA VACCINE  08/24/2017  . HEMOGLOBIN A1C  09/04/2017  . FOOT EXAM  03/07/2018  . TETANUS/TDAP  09/10/2018  . COLONOSCOPY  09/12/2018  . MAMMOGRAM  04/11/2019  . DEXA SCAN  Completed  . Hepatitis C Screening  Completed  . PNA vac Low Risk Adult  Completed      Plan:     Continue doing brain stimulating activities (puzzles, reading, adult coloring books, staying active) to keep memory sharp.   Continue to eat heart healthy diet (full of fruits, vegetables, whole grains, lean protein, water--limit salt, fat, and sugar intake) and increase physical activity as tolerated.  I have personally reviewed and noted the following in the patient's chart:   . Medical and social history . Use of alcohol, tobacco or illicit drugs  . Current medications and supplements . Functional ability and status . Nutritional status . Physical activity . Advanced directives . List of other physicians . Vitals . Screenings to include cognitive, depression, and falls . Referrals and appointments  In addition, I have reviewed and discussed with patient certain preventive protocols, quality metrics, and best practice recommendations. A written personalized care plan for preventive services as well as general preventive health recommendations were provided to patient.     Michiel Cowboy, RN  07/07/2017

## 2017-07-10 ENCOUNTER — Other Ambulatory Visit (INDEPENDENT_AMBULATORY_CARE_PROVIDER_SITE_OTHER): Payer: Medicare Other

## 2017-07-10 ENCOUNTER — Encounter: Payer: Self-pay | Admitting: Nurse Practitioner

## 2017-07-10 ENCOUNTER — Ambulatory Visit (INDEPENDENT_AMBULATORY_CARE_PROVIDER_SITE_OTHER): Payer: Medicare Other | Admitting: Nurse Practitioner

## 2017-07-10 VITALS — BP 140/70 | HR 61 | Temp 98.0°F | Resp 16 | Ht 63.0 in | Wt 149.0 lb

## 2017-07-10 DIAGNOSIS — E785 Hyperlipidemia, unspecified: Secondary | ICD-10-CM | POA: Diagnosis not present

## 2017-07-10 DIAGNOSIS — I10 Essential (primary) hypertension: Secondary | ICD-10-CM

## 2017-07-10 LAB — COMPREHENSIVE METABOLIC PANEL
ALBUMIN: 3.8 g/dL (ref 3.5–5.2)
ALT: 10 U/L (ref 0–35)
AST: 14 U/L (ref 0–37)
Alkaline Phosphatase: 75 U/L (ref 39–117)
BUN: 53 mg/dL — AB (ref 6–23)
CHLORIDE: 94 meq/L — AB (ref 96–112)
CO2: 29 meq/L (ref 19–32)
CREATININE: 2.07 mg/dL — AB (ref 0.40–1.20)
Calcium: 9.1 mg/dL (ref 8.4–10.5)
GFR: 30.68 mL/min — ABNORMAL LOW (ref >60.00)
Glucose, Bld: 260 mg/dL — ABNORMAL HIGH (ref 70–99)
Potassium: 3.5 mEq/L (ref 3.5–5.1)
Sodium: 133 mEq/L — ABNORMAL LOW (ref 135–145)
Total Bilirubin: 0.6 mg/dL (ref 0.2–1.2)
Total Protein: 7.3 g/dL (ref 6.0–8.3)

## 2017-07-10 LAB — LIPID PANEL
Cholesterol: 157 mg/dL (ref 0–200)
HDL: 39.4 mg/dL (ref >39.00)
NonHDL: 117.17
Total CHOL/HDL Ratio: 4
Triglycerides: 270 mg/dL — ABNORMAL HIGH (ref 0.0–149.0)
VLDL: 54 mg/dL — AB (ref 0.0–40.0)

## 2017-07-10 LAB — LDL CHOLESTEROL, DIRECT: LDL DIRECT: 79 mg/dL

## 2017-07-10 NOTE — Progress Notes (Signed)
Name: [name]   MRN: [id number]    DOB: 10-Nov-1950   Date:07/10/2017       Progress Note  Subjective  Chief Complaint  Chief Complaint  Patient presents with  . Follow-up    blood pressure    HPI [name] is here today for BP follow up. We will follow up on hyperlipidemia as well.  Hypertension -maintained on amlodipine 10, telmisartan-HCTZ 80-25, metoprolol 100 daily, and clonidine 0.2 TID. Her losartan/hctz was replaced with telmisartan- hctz about 2 weeks ago due to a recall, she was asked to come in today to see how she was doing on the medication adjustment Reports daily medication compliance without noted adverse medication effects. Reports she checks her blood pressure readings about every other day in the morning with most recent readings ranging from 130-140s/80s at home Denies headaches, vision changes, chest pain, shortness of breath, edema.  BP Readings from Last 3 Encounters:  07/10/17 140/70  07/07/17 (!) 143/62  03/09/17 130/72   Cholesterol- maintained on atorvastatin 80 daily Reports daily medication compliance without noted adverse medication effects including nausea, myalgias. She has a History of stroke and is also maintained on 325 mg asa daily for secondary stroke prevention.  Lab Results  Component Value Date   CHOL 141 01/11/2016   HDL 38.40 (L) 01/11/2016   LDLCALC 84 01/11/2016   LDLDIRECT 130.4 12/13/2012   TRIG 95.0 01/11/2016   CHOLHDL 4 01/11/2016     Patient Active Problem List   Diagnosis Date Noted  . Olecranon bursitis of right elbow 03/09/2017  . Thyroid nodule 06/29/2016  . Type 2 diabetes mellitus ([place]) 06/13/2016  . Vitamin D deficiency 01/12/2016  . Medicare annual wellness visit, initial 01/11/2016  . Lacunar infarct, acute ([place]) 03/23/2015  . Non compliance w medication regimen 01/16/2015  . Anemia 01/08/2015  . Acute kidney injury ([place]) 01/08/2015  . Stroke ([place]) 01/08/2015  . Hyperlipemia   . Carotid bruit  present 12/13/2012  . Routine health maintenance 10/24/2010  . PERIPHERAL EDEMA 09/10/2009  . COUGH 04/03/2009  . ELECTROCARDIOGRAM, ABNORMAL 04/11/2008  . HEART MURMUR, SYSTOLIC 10/62/6948  . SEBACEOUS CYST, NECK 12/13/2006  . Hyperthyroidism 09/27/2006  . Secondary diabetes with peripheral vascular disease ([place]) 09/27/2006  . Hyperlipidemia LDL goal <70 09/27/2006  . TOBACCO ABUSE 09/27/2006  . Essential hypertension 09/27/2006    Past Surgical History:  Procedure Laterality Date  . ABDOMINAL HYSTERECTOMY     --?ovaries remain  . RETINOPATHY SURGERY  2013   Dr. Zigmund Daniel    Family History  Problem Relation Age of Onset  . Hypertension Mother   . Sleep apnea Mother   . Diabetes Mother   . Hyperlipidemia Mother   . Cancer Father        lung cancer - smoker  . Hypertension Sister   . Diabetes Sister   . Hypertension Brother   . Hypertension Sister   . Diabetes Brother   . Hypertension Brother   . Breast cancer Neg Hx     Social History   Socioeconomic History  . Marital status: Divorced    Spouse name: Not on file  . Number of children: 1  . Years of education: 21  . Highest education level: Not on file  Occupational History  . Occupation: Insurance account manager: [employer]  Social Needs  . Financial resource strain: Not hard at all  . Food insecurity:    Worry: Never true    Inability: Never true  .  Transportation needs:    Medical: No    Non-medical: No  Tobacco Use  . Smoking status: Former Smoker    Packs/day: 0.50    Years: 35.00    Pack years: 17.50    Types: Cigarettes    Last attempt to quit: 07/07/2014    Years since quitting: 3.0  . Smokeless tobacco: Never Used  . Tobacco comment: patient advised to keep diary, develop strategy and stop  Substance and Sexual Activity  . Alcohol use: No  . Drug use: No  . Sexual activity: Never    Partners: Male    Birth control/protection: Surgical    Comment: TAH--?ovaries remain   Lifestyle  . Physical activity:    Days per week: 6 days    Minutes per session: 60 min  . Stress: Not at all  Relationships  . Social connections:    Talks on phone: More than three times a week    Gets together: More than three times a week    Attends religious service: More than 4 times per year    Active member of club or organization: Yes    Attends meetings of clubs or organizations: More than 4 times per year    Relationship status: Divorced  . Intimate partner violence:    Fear of current or ex partner: Not on file    Emotionally abused: Not on file    Physically abused: Not on file    Forced sexual activity: Not on file  Other Topics Concern  . Not on file  Social History Narrative   Denies abuse and feels safe at home.     Current Outpatient Medications:  .  amLODipine (NORVASC) 10 MG tablet, Take 1 tablet (10 mg total) by mouth daily., Disp: 90 tablet, Rfl: 3 .  aspirin 325 MG tablet, Take 325 mg by mouth daily., Disp: , Rfl:  .  atorvastatin (LIPITOR) 80 MG tablet, TAKE 1 TABLET DAILY. FOLLOW-UP APPT IS DUE NEED LIPIDS CHECK MUST SEE PROVIDER FOR FUTURE REFILLS, Disp: 30 tablet, Rfl: 0 .  calcitRIOL (ROCALTROL) 0.25 MCG capsule, 1 (ONE) CAPSULE THREE TIMES A WEEK, Disp: , Rfl: 5 .  cloNIDine (CATAPRES) 0.2 MG tablet, Take 1 tablet (0.2 mg total) by mouth 3 (three) times daily., Disp: 90 tablet, Rfl: 0 .  furosemide (LASIX) 40 MG tablet, Take 40 mg by mouth daily., Disp: , Rfl:  .  glucose blood (ONETOUCH VERIO) test strip, USE AS INSTRUCTED (TESTS 2 TIMES A DAY), Disp: 100 each, Rfl: 5 .  metFORMIN (GLUCOPHAGE-XR) 500 MG 24 hr tablet, Take 1 tablet (500 mg total) daily with breakfast by mouth., Disp: 90 tablet, Rfl: 3 .  metoprolol succinate (TOPROL-XL) 100 MG 24 hr tablet, TAKE 1 TABLET BY MOUTH DAILY FOR HIGH BLOOD PRESSURE, Disp: 90 tablet, Rfl: 3 .  minoxidil (LONITEN) 2.5 MG tablet, Take 2.5 mg by mouth 2 (two) times daily., Disp: , Rfl:  .  ONETOUCH VERIO test  strip, USE AS INSTRUCTED (TESTS 2 TIMES A DAY), Disp: 100 each, Rfl: 5 .  repaglinide (PRANDIN) 2 MG tablet, Take 0.5 tablets (1 mg total) by mouth 3 (three) times daily before meals., Disp: 50 tablet, Rfl: 11 .  telmisartan-hydrochlorothiazide (MICARDIS HCT) 80-25 MG tablet, Take 1 tablet by mouth daily., Disp: 30 tablet, Rfl: 1 .  Vitamin D, Ergocalciferol, (DRISDOL) 50000 units CAPS capsule, 1 (ONE) CAPSULE TWICE A WEEK, Disp: , Rfl: 5  No Known Allergies   ROS See HPI  Objective  Vitals:  07/10/17 1311 07/10/17 1343  BP: (!) 154/74 140/70  Pulse: 61   Resp: 16   Temp: 98 F (36.7 C)   TempSrc: Oral   SpO2: 98%   Weight: 149 lb (67.6 kg)   Height: 5\' 3"  (1.6 m)     Body mass index is 26.39 kg/m.  Physical Exam Vital signs reviewed. Constitutional: Patient appears well-developed and well-nourished. No distress.  HENT: Head: Normocephalic and atraumatic.  Nose: Nose normal. Mouth/Throat: Oropharynx is clear and moist. No oropharyngeal exudate.  Eyes: Conjunctivae and EOM are normal. Pupils are equal, round, and reactive to light. No scleral icterus.  Neck: Normal range of motion. Neck supple.  Cardiovascular: Normal rate, regular rhythm. Murmur heard. No BLE edema. Distal pulses intact. Pulmonary/Chest: Effort normal and breath sounds normal. No respiratory distress. Neurological: She is alert and oriented to person, place, and time. No cranial nerve deficit. Coordination, balance, strength, speech and gait are normal.  Skin: Skin is warm and dry. No rash noted. No erythema.  Psychiatric: Patient has a normal mood and affect. behavior is normal. Judgment and thought content normal.   PHQ2/9: Depression screen [facility] 2/9 07/07/2017 02/24/2017 01/11/2016  Decreased Interest 0 0 0  Down, Depressed, Hopeless 0 0 0  PHQ - 2 Score 0 0 0    Fall Risk: Fall Risk  07/07/2017 02/24/2017 01/11/2016 03/23/2015  Falls in the past year? No No No No     Assessment & Plan RTC in 3-4  weeks for F/U: HTN- recheck BP Discussed the role of healthy diet in the management on HTN, HLD and printed additional information on AVS  -Reviewed Health Maintenance: ophthalmology exam overdue, otherwise up to date

## 2017-07-10 NOTE — Assessment & Plan Note (Signed)
BP reading was elevated on first check but decreased on second check today, with readings near goal at home  Encouraged to continue monitoring BP readings at home RTC in about 1 month for F/U, to make sure BP remains stable - Comprehensive metabolic panel; Future

## 2017-07-10 NOTE — Patient Instructions (Addendum)
Please try to check your blood pressure once daily or at least a few times a week, at the same time each day, and keep a log. Please return in about 3-4 weeks, so I can make sure your blood pressure remains stable. Your blood pressure goal is less than 140/90.   Mediterranean Diet A Mediterranean diet refers to food and lifestyle choices that are based on the traditions of countries located on the [employer]. This way of eating has been shown to help prevent certain conditions and improve outcomes for people who have chronic diseases, like kidney disease and heart disease. What are tips for following this plan? Lifestyle  Cook and eat meals together with your family, when possible.  Drink enough fluid to keep your urine clear or pale yellow.  Be physically active every day. This includes: ? Aerobic exercise like running or swimming. ? Leisure activities like gardening, walking, or housework.  Get 7-8 hours of sleep each night.  If recommended by your health care provider, drink red wine in moderation. This means 1 glass a day for nonpregnant women and 2 glasses a day for men. A glass of wine equals 5 oz (150 mL). Reading food labels  Check the serving size of packaged foods. For foods such as rice and pasta, the serving size refers to the amount of cooked product, not dry.  Check the total fat in packaged foods. Avoid foods that have saturated fat or trans fats.  Check the ingredients list for added sugars, such as corn syrup. Shopping  At the grocery store, buy most of your food from the areas near the walls of the store. This includes: ? Fresh fruits and vegetables (produce). ? Grains, beans, nuts, and seeds. Some of these may be available in unpackaged forms or large amounts (in bulk). ? Fresh seafood. ? Poultry and eggs. ? Low-fat dairy products.  Buy whole ingredients instead of prepackaged foods.  Buy fresh fruits and vegetables in-season from local farmers  markets.  Buy frozen fruits and vegetables in resealable bags.  If you do not have access to quality fresh seafood, buy precooked frozen shrimp or canned fish, such as tuna, salmon, or sardines.  Buy small amounts of raw or cooked vegetables, salads, or olives from the deli or salad bar at your store.  Stock your pantry so you always have certain foods on hand, such as olive oil, canned tuna, canned tomatoes, rice, pasta, and beans. Cooking  Cook foods with extra-virgin olive oil instead of using butter or other vegetable oils.  Have meat as a side dish, and have vegetables or grains as your main dish. This means having meat in small portions or adding small amounts of meat to foods like pasta or stew.  Use beans or vegetables instead of meat in common dishes like chili or lasagna.  Experiment with different cooking methods. Try roasting or broiling vegetables instead of steaming or sauteing them.  Add frozen vegetables to soups, stews, pasta, or rice.  Add nuts or seeds for added healthy fat at each meal. You can add these to yogurt, salads, or vegetable dishes.  Marinate fish or vegetables using olive oil, lemon juice, garlic, and fresh herbs. Meal planning  Plan to eat 1 vegetarian meal one day each week. Try to work up to 2 vegetarian meals, if possible.  Eat seafood 2 or more times a week.  Have healthy snacks readily available, such as: ? Vegetable sticks with hummus. ? [place] yogurt. ? Fruit and  nut trail mix.  Eat balanced meals throughout the week. This includes: ? Fruit: 2-3 servings a day ? Vegetables: 4-5 servings a day ? Low-fat dairy: 2 servings a day ? Fish, poultry, or lean meat: 1 serving a day ? Beans and legumes: 2 or more servings a week ? Nuts and seeds: 1-2 servings a day ? Whole grains: 6-8 servings a day ? Extra-virgin olive oil: 3-4 servings a day  Limit red meat and sweets to only a few servings a month What are my food choices?  Mediterranean  diet ? Recommended ? Grains: Whole-grain pasta. Brown rice. Bulgar wheat. Polenta. Couscous. Whole-wheat bread. [place]. ? Vegetables: Artichokes. Beets. Broccoli. Cabbage. Carrots. Eggplant. Green beans. Chard. Kale. Spinach. Onions. Leeks. Peas. Squash. Tomatoes. Peppers. Radishes. ? Fruits: Apples. Apricots. Avocado. Berries. Bananas. Cherries. Dates. Figs. Grapes. Lemons. Melon. Oranges. Peaches. Plums. Pomegranate. ? Meats and other protein foods: Beans. Almonds. Sunflower seeds. Pine nuts. Peanuts. [place]. Salmon. Scall[place]. Shrimp. [place]. Tilapia. Clams. Oysters. Eggs. ? Dairy: Low-fat milk. Cheese. Greek yogurt. ? Beverages: Water. Red wine. Herbal tea. ? Fats and oils: Extra virgin olive oil. Avocado oil. Grape seed oil. ? Sweets and desserts: [place] yogurt with honey. Baked apples. Poached pears. Trail mix. ? Seasoning and other foods: Basil. Cilantro. Coriander. Cumin. Mint. Parsley. Sage. Rosemary. Tarragon. Garlic. Oregano. Thyme. Pepper. Balsalmic vinegar. Tahini. Hummus. Tomato sauce. Olives. Mushrooms. ? Limit these ? Grains: Prepackaged pasta or rice dishes. Prepackaged cereal with added sugar. ? Vegetables: Deep fried potatoes (french fries). ? Fruits: Fruit canned in syrup. ? Meats and other protein foods: Beef. Pork. Lamb. Poultry with skin. Hot dogs. [place] Salines. ? Dairy: Ice cream. Sour cream. Whole milk. ? Beverages: Juice. Sugar-sweetened soft drinks. Beer. Liquor and spirits. ? Fats and oils: Butter. Canola oil. Vegetable oil. Beef fat (tallow). Lard. ? Sweets and desserts: Cookies. Cakes. Pies. Candy. ? Seasoning and other foods: Mayonnaise. Premade sauces and marinades. ? The items listed may not be a complete list. Talk with your dietitian about what dietary choices are right for you. Summary  The Mediterranean diet includes both food and lifestyle choices.  Eat a variety of fresh fruits and vegetables, beans, nuts, seeds, and whole grains.  Limit the amount of red  meat and sweets that you eat.  Talk with your health care provider about whether it is safe for you to drink red wine in moderation. This means 1 glass a day for nonpregnant women and 2 glasses a day for men. A glass of wine equals 5 oz (150 mL). This information is not intended to replace advice given to you by your health care provider. Make sure you discuss any questions you have with your health care provider. Document Released: 09/03/2015 Document Revised: 10/06/2015 Document Reviewed: 09/03/2015 Elsevier Interactive Patient Education  [employer].

## 2017-07-10 NOTE — Assessment & Plan Note (Signed)
Continue atorvastatin Update labs, unsure if she is fasting F/U with further recommendations pending lab results - Comprehensive metabolic panel; Future - Lipid panel; Future

## 2017-07-13 ENCOUNTER — Other Ambulatory Visit: Payer: Self-pay | Admitting: Nurse Practitioner

## 2017-07-27 ENCOUNTER — Other Ambulatory Visit: Payer: Self-pay | Admitting: Nurse Practitioner

## 2017-07-28 ENCOUNTER — Other Ambulatory Visit: Payer: Self-pay | Admitting: Nurse Practitioner

## 2017-08-01 DIAGNOSIS — H25813 Combined forms of age-related cataract, bilateral: Secondary | ICD-10-CM | POA: Diagnosis not present

## 2017-08-01 DIAGNOSIS — E113293 Type 2 diabetes mellitus with mild nonproliferative diabetic retinopathy without macular edema, bilateral: Secondary | ICD-10-CM | POA: Diagnosis not present

## 2017-08-01 LAB — HM DIABETES EYE EXAM

## 2017-08-04 ENCOUNTER — Encounter: Payer: Self-pay | Admitting: Nurse Practitioner

## 2017-08-04 ENCOUNTER — Ambulatory Visit (INDEPENDENT_AMBULATORY_CARE_PROVIDER_SITE_OTHER): Payer: Medicare Other | Admitting: Nurse Practitioner

## 2017-08-04 VITALS — BP 164/82 | HR 64 | Temp 98.8°F | Resp 16 | Ht 63.0 in | Wt 147.8 lb

## 2017-08-04 DIAGNOSIS — N183 Chronic kidney disease, stage 3 unspecified: Secondary | ICD-10-CM

## 2017-08-04 DIAGNOSIS — R140 Abdominal distension (gaseous): Secondary | ICD-10-CM

## 2017-08-04 DIAGNOSIS — I10 Essential (primary) hypertension: Secondary | ICD-10-CM

## 2017-08-04 MED ORDER — CLONIDINE HCL 0.3 MG PO TABS: 0.3000 mg | ORAL_TABLET | Freq: Three times a day (TID) | ORAL | 1 refills | Status: DC

## 2017-08-04 MED ORDER — CLONIDINE HCL 0.2 MG PO TABS: 0.2000 mg | ORAL_TABLET | Freq: Three times a day (TID) | ORAL | 0 refills | Status: DC

## 2017-08-04 NOTE — Assessment & Plan Note (Signed)
BP remains elevated on multiple medications, suspect related to CKD I am asking her to follow up with nephrologist for further evaluation of hypertension We also discussed the role of healthy diet and exercise in the management of HTN and printed additional information on AVS - cloNIDine (CATAPRES) 0.2 MG tablet; Take 1 tablet (0.2 mg total) by mouth 3 (three) times daily.  Dispense: 90 tablet; Refill: 0

## 2017-08-04 NOTE — Progress Notes (Signed)
Name: [name]   MRN: [id number]    DOB: 1950-07-06   Date:08/04/2017       Progress Note  Subjective  Chief Complaint  Chief Complaint  Patient presents with  . Follow-up    Blood pressure    HPI [name] is here today for follow up of HTN, we will discuss a complaint of abdominal bloating as well.  Hypertension -maintained on amlodipine 10, telmisartan-HCTZ 80-25, metoprolol 100 daily, and clonidine 0.2 TID. At her last OV in June, BP was slightly elevated so I asked her to return in 1 month for a follow up blood pressure check. Reports daily medication compliance without noted adverse medication effects. Reports she has not recently checked her BP readings at home because her machine is out of batteries. Denies headaches, vision changes, chest pain, shortness of breath, edema. Overall feels well today.  BP Readings from Last 3 Encounters:  08/04/17 (!) 164/82  07/10/17 140/70  07/07/17 (!) 143/62   Abdominal bloating- She c/o frequent abdominal distension and bloating for some time now, at least over the past year She has discussed this with her nephrologist and was instructed to take miralax daily but she admits she often skips the miralax because she wants to try to have a BM without it, but she also says that when she skips the miralax her abdomen becomes distended and she feels bloated and constipated She denies fevers, heartburn, nausea, vomiting, abdominal pain, diarrhea, rectal bleeding, dysuria, hematuria. She had last BM about 2 days ago   Patient Active Problem List   Diagnosis Date Noted  . Olecranon bursitis of right elbow 03/09/2017  . Thyroid nodule 06/29/2016  . Type 2 diabetes mellitus ([place]) 06/13/2016  . Vitamin D deficiency 01/12/2016  . Medicare annual wellness visit, initial 01/11/2016  . Lacunar infarct, acute ([place]) 03/23/2015  . Non compliance w medication regimen 01/16/2015  . Anemia 01/08/2015  . Acute kidney injury ([place]) 01/08/2015  .  Stroke ([place]) 01/08/2015  . Hyperlipemia   . Carotid bruit present 12/13/2012  . Routine health maintenance 10/24/2010  . PERIPHERAL EDEMA 09/10/2009  . ELECTROCARDIOGRAM, ABNORMAL 04/11/2008  . HEART MURMUR, SYSTOLIC 56/43/3295  . SEBACEOUS CYST, NECK 12/13/2006  . Hyperthyroidism 09/27/2006  . Secondary diabetes with peripheral vascular disease ([place]) 09/27/2006  . Hyperlipidemia LDL goal <70 09/27/2006  . TOBACCO ABUSE 09/27/2006  . Essential hypertension 09/27/2006    Past Surgical History:  Procedure Laterality Date  . ABDOMINAL HYSTERECTOMY     --?ovaries remain  . RETINOPATHY SURGERY  2013   [name]    Family History  Problem Relation Age of Onset  . Hypertension Mother   . Sleep apnea Mother   . Diabetes Mother   . Hyperlipidemia Mother   . Cancer Father        lung cancer - smoker  . Hypertension Sister   . Diabetes Sister   . Hypertension Brother   . Hypertension Sister   . Diabetes Brother   . Hypertension Brother   . Breast cancer Neg Hx     Social History   Socioeconomic History  . Marital status: Divorced    Spouse name: Not on file  . Number of children: 1  . Years of education: 69  . Highest education level: Not on file  Occupational History  . Occupation: Insurance account manager: [employer]  Social Needs  . Financial resource strain: Not hard at all  . Food insecurity:    Worry:  Never true    Inability: Never true  . Transportation needs:    Medical: No    Non-medical: No  Tobacco Use  . Smoking status: Former Smoker    Packs/day: 0.50    Years: 35.00    Pack years: 17.50    Types: Cigarettes    Last attempt to quit: 07/07/2014    Years since quitting: 3.0  . Smokeless tobacco: Never Used  . Tobacco comment: patient advised to keep diary, develop strategy and stop  Substance and Sexual Activity  . Alcohol use: No  . Drug use: No  . Sexual activity: Never    Partners: Male    Birth control/protection: Surgical     Comment: TAH--?ovaries remain  Lifestyle  . Physical activity:    Days per week: 6 days    Minutes per session: 60 min  . Stress: Not at all  Relationships  . Social connections:    Talks on phone: More than three times a week    Gets together: More than three times a week    Attends religious service: More than 4 times per year    Active member of club or organization: Yes    Attends meetings of clubs or organizations: More than 4 times per year    Relationship status: Divorced  . Intimate partner violence:    Fear of current or ex partner: Not on file    Emotionally abused: Not on file    Physically abused: Not on file    Forced sexual activity: Not on file  Other Topics Concern  . Not on file  Social History Narrative   Denies abuse and feels safe at home.     Current Outpatient Medications:  .  amLODipine (NORVASC) 10 MG tablet, Take 1 tablet (10 mg total) by mouth daily., Disp: 90 tablet, Rfl: 3 .  aspirin 325 MG tablet, Take 325 mg by mouth daily., Disp: , Rfl:  .  atorvastatin (LIPITOR) 80 MG tablet, TAKE 1 TABLET DAILY. FOLLOW-UP APPT IS DUE NEED LIPIDS CHECK MUST SEE PROVIDER FOR FUTURE REFILLS, Disp: 30 tablet, Rfl: 0 .  calcitRIOL (ROCALTROL) 0.25 MCG capsule, 1 (ONE) CAPSULE THREE TIMES A WEEK, Disp: , Rfl: 5 .  cloNIDine (CATAPRES) 0.2 MG tablet, Take 1 tablet (0.2 mg total) by mouth 3 (three) times daily., Disp: 90 tablet, Rfl: 0 .  furosemide (LASIX) 40 MG tablet, Take 40 mg by mouth daily., Disp: , Rfl:  .  glucose blood (ONETOUCH VERIO) test strip, USE AS INSTRUCTED (TESTS 2 TIMES A DAY), Disp: 100 each, Rfl: 5 .  metFORMIN (GLUCOPHAGE-XR) 500 MG 24 hr tablet, Take 1 tablet (500 mg total) daily with breakfast by mouth., Disp: 90 tablet, Rfl: 3 .  metoprolol succinate (TOPROL-XL) 100 MG 24 hr tablet, TAKE 1 TABLET BY MOUTH DAILY FOR HIGH BLOOD PRESSURE, Disp: 90 tablet, Rfl: 3 .  minoxidil (LONITEN) 2.5 MG tablet, Take 2.5 mg by mouth 2 (two) times daily., Disp: ,  Rfl:  .  ONETOUCH VERIO test strip, USE AS INSTRUCTED (TESTS 2 TIMES A DAY), Disp: 100 each, Rfl: 5 .  repaglinide (PRANDIN) 2 MG tablet, Take 0.5 tablets (1 mg total) by mouth 3 (three) times daily before meals., Disp: 50 tablet, Rfl: 11 .  telmisartan-hydrochlorothiazide (MICARDIS HCT) 80-25 MG tablet, TAKE 1 TABLET BY MOUTH EVERY DAY, Disp: 90 tablet, Rfl: 1 .  Vitamin D, Ergocalciferol, (DRISDOL) 50000 units CAPS capsule, 1 (ONE) CAPSULE TWICE A WEEK, Disp: , Rfl: 5  No  Known Allergies   ROS See HPI  Objective  Vitals:   08/04/17 0921  BP: (!) 164/82  Pulse: 64  Resp: 16  Temp: 98.8 F (37.1 C)  TempSrc: Oral  SpO2: 98%  Weight: 147 lb 12.8 oz (67 kg)  Height: 5\' 3"  (1.6 m)    Body mass index is 26.18 kg/m.  Physical Exam Vital signs reviewed. Constitutional: Patient appears well-developed and well-nourished. No distress.  HENT: Head: Normocephalic and atraumatic.  Nose: Nose normal. Mouth/Throat: Oropharynx is clear and moist. No oropharyngeal exudate.  Eyes: Conjunctivae and EOM are normal. Pupils are equal, round, and reactive to light. No scleral icterus.  Neck: Normal range of motion. Neck supple.  Cardiovascular: Normal rate, regular rhythm. Murmur heard. No BLE edema. Distal pulses intact. Pulmonary/Chest: Effort normal and breath sounds normal. No respiratory distress. Abdominal: Soft, distended. Bowel sounds are normal. There is no tenderness. no masses Neurological: She is alert and oriented to person, place, and time. No cranial nerve deficit. Coordination, balance, strength, speech and gait are normal.  Skin: Skin is warm and dry. No rash noted. No erythema.  Psychiatric: Patient has a normal mood and affect. behavior is normal. Judgment and thought content normal.   Assessment & Plan F/U TBD pending test results  Abdominal distension (gaseous) Will update imaging for abdominal distension F/U with further recommendations pending test results - CT  Abdomen Pelvis Wo Contrast; Future

## 2017-08-04 NOTE — Patient Instructions (Addendum)
Please follow up with your kidney doctors about your blood pressure.  I have placed an order for a CT scan of your stomach. Our office will begin processing this referral. Please follow up if you have not heard anything about this referral within 10 days.  I will reach out to you with further follow up when I get your CT results back   DASH Eating Plan DASH stands for "Dietary Approaches to Stop Hypertension." The DASH eating plan is a healthy eating plan that has been shown to reduce high blood pressure (hypertension). It may also reduce your risk for type 2 diabetes, heart disease, and stroke. The DASH eating plan may also help with weight loss. What are tips for following this plan? General guidelines  Avoid eating more than 2,300 mg (milligrams) of salt (sodium) a day. If you have hypertension, you may need to reduce your sodium intake to 1,500 mg a day.  Limit alcohol intake to no more than 1 drink a day for nonpregnant women and 2 drinks a day for men. One drink equals 12 oz of beer, 5 oz of wine, or 1 oz of hard liquor.  Work with your health care provider to maintain a healthy body weight or to lose weight. Ask what an ideal weight is for you.  Get at least 30 minutes of exercise that causes your heart to beat faster (aerobic exercise) most days of the week. Activities may include walking, swimming, or biking.  Work with your health care provider or diet and nutrition specialist (dietitian) to adjust your eating plan to your individual calorie needs. Reading food labels  Check food labels for the amount of sodium per serving. Choose foods with less than 5 percent of the Daily Value of sodium. Generally, foods with less than 300 mg of sodium per serving fit into this eating plan.  To find whole grains, look for the word "whole" as the first word in the ingredient list. Shopping  Buy products labeled as "low-sodium" or "no salt added."  Buy fresh foods. Avoid canned foods and  premade or frozen meals. Cooking  Avoid adding salt when cooking. Use salt-free seasonings or herbs instead of table salt or sea salt. Check with your health care provider or pharmacist before using salt substitutes.  Do not fry foods. Cook foods using healthy methods such as baking, boiling, grilling, and broiling instead.  Cook with heart-healthy oils, such as olive, canola, soybean, or sunflower oil. Meal planning   Eat a balanced diet that includes: ? 5 or more servings of fruits and vegetables each day. At each meal, try to fill half of your plate with fruits and vegetables. ? Up to 6-8 servings of whole grains each day. ? Less than 6 oz of lean meat, poultry, or fish each day. A 3-oz serving of meat is about the same size as a deck of cards. One egg equals 1 oz. ? 2 servings of low-fat dairy each day. ? A serving of nuts, seeds, or beans 5 times each week. ? Heart-healthy fats. Healthy fats called Omega-3 fatty acids are found in foods such as flaxseeds and coldwater fish, like sardines, salmon, and mackerel.  Limit how much you eat of the following: ? Canned or prepackaged foods. ? Food that is high in trans fat, such as fried foods. ? Food that is high in saturated fat, such as fatty meat. ? Sweets, desserts, sugary drinks, and other foods with added sugar. ? Full-fat dairy products.  Do not  salt foods before eating.  Try to eat at least 2 vegetarian meals each week.  Eat more home-cooked food and less restaurant, buffet, and fast food.  When eating at a restaurant, ask that your food be prepared with less salt or no salt, if possible. What foods are recommended? The items listed may not be a complete list. Talk with your dietitian about what dietary choices are best for you. Grains Whole-grain or whole-wheat bread. Whole-grain or whole-wheat pasta. Brown rice. [place]. Bulgur. Whole-grain and low-sodium cereals. Pita bread. Low-fat, low-sodium crackers.  Whole-wheat flour tortillas. Vegetables Fresh or frozen vegetables (raw, steamed, roasted, or grilled). Low-sodium or reduced-sodium tomato and vegetable juice. Low-sodium or reduced-sodium tomato sauce and tomato paste. Low-sodium or reduced-sodium canned vegetables. Fruits All fresh, dried, or frozen fruit. Canned fruit in natural juice (without added sugar). Meat and other protein foods Skinless chicken or [place]. Ground chicken or [place]. Pork with fat trimmed off. Fish and seafood. Egg whites. Dried beans, peas, or lentils. Unsalted nuts, nut butters, and seeds. Unsalted canned beans. Lean cuts of beef with fat trimmed off. Low-sodium, lean deli meat. Dairy Low-fat (1%) or fat-free (skim) milk. Fat-free, low-fat, or reduced-fat cheeses. Nonfat, low-sodium ricotta or cottage cheese. Low-fat or nonfat yogurt. Low-fat, low-sodium cheese. Fats and oils Soft margarine without trans fats. Vegetable oil. Low-fat, reduced-fat, or light mayonnaise and salad dressings (reduced-sodium). Canola, safflower, olive, soybean, and sunflower oils. Avocado. Seasoning and other foods Herbs. Spices. Seasoning mixes without salt. Unsalted popcorn and pretzels. Fat-free sweets. What foods are not recommended? The items listed may not be a complete list. Talk with your dietitian about what dietary choices are best for you. Grains Baked goods made with fat, such as croissants, muffins, or some breads. Dry pasta or rice meal packs. Vegetables Creamed or fried vegetables. Vegetables in a cheese sauce. Regular canned vegetables (not low-sodium or reduced-sodium). Regular canned tomato sauce and paste (not low-sodium or reduced-sodium). Regular tomato and vegetable juice (not low-sodium or reduced-sodium). Angie Fava. Olives. Fruits Canned fruit in a light or heavy syrup. Fried fruit. Fruit in cream or butter sauce. Meat and other protein foods Fatty cuts of meat. Ribs. Fried meat. Berniece Salines. Sausage. Bologna and other  processed lunch meats. Salami. Fatback. Hotdogs. Bratwurst. Salted nuts and seeds. Canned beans with added salt. Canned or smoked fish. Whole eggs or egg yolks. Chicken or [place] with skin. Dairy Whole or 2% milk, cream, and half-and-half. Whole or full-fat cream cheese. Whole-fat or sweetened yogurt. Full-fat cheese. Nondairy creamers. Whipped toppings. Processed cheese and cheese spreads. Fats and oils Butter. Stick margarine. Lard. Shortening. Ghee. Bacon fat. Tropical oils, such as coconut, palm kernel, or palm oil. Seasoning and other foods Salted popcorn and pretzels. Onion salt, garlic salt, seasoned salt, table salt, and sea salt. Worcestershire sauce. Tartar sauce. Barbecue sauce. Teriyaki sauce. Soy sauce, including reduced-sodium. Steak sauce. Canned and packaged gravies. Fish sauce. Oyster sauce. Cocktail sauce. Horseradish that you find on the shelf. Ketchup. Mustard. Meat flavorings and tenderizers. Bouillon cubes. Hot sauce and Tabasco sauce. Premade or packaged marinades. Premade or packaged taco seasonings. Relishes. Regular salad dressings. Where to find more information:  National Heart, Lung, and [place]: https://wilson-eaton.com/  American Heart Association: www.heart.org Summary  The DASH eating plan is a healthy eating plan that has been shown to reduce high blood pressure (hypertension). It may also reduce your risk for type 2 diabetes, heart disease, and stroke.  With the DASH eating plan, you should limit salt (sodium) intake to  2,300 mg a day. If you have hypertension, you may need to reduce your sodium intake to 1,500 mg a day.  When on the DASH eating plan, aim to eat more fresh fruits and vegetables, whole grains, lean proteins, low-fat dairy, and heart-healthy fats.  Work with your health care provider or diet and nutrition specialist (dietitian) to adjust your eating plan to your individual calorie needs. This information is not intended to replace advice given to  you by your health care provider. Make sure you discuss any questions you have with your health care provider. Document Released: 12/30/2010 Document Revised: 01/04/2016 Document Reviewed: 01/04/2016 Elsevier Interactive Patient Education  [employer].

## 2017-08-14 ENCOUNTER — Ambulatory Visit (INDEPENDENT_AMBULATORY_CARE_PROVIDER_SITE_OTHER)
Admission: RE | Admit: 2017-08-14 | Discharge: 2017-08-14 | Disposition: A | Discharge status: Home / Self Care | Payer: Medicare Other | Source: Ambulatory Visit | Attending: Nurse Practitioner | Admitting: Nurse Practitioner

## 2017-08-14 ENCOUNTER — Encounter: Payer: Self-pay | Admitting: Nurse Practitioner

## 2017-08-14 DIAGNOSIS — R140 Abdominal distension (gaseous): Secondary | ICD-10-CM

## 2017-08-14 DIAGNOSIS — N189 Chronic kidney disease, unspecified: Secondary | ICD-10-CM | POA: Diagnosis not present

## 2017-08-22 ENCOUNTER — Other Ambulatory Visit: Payer: Self-pay | Admitting: Nurse Practitioner

## 2017-08-22 DIAGNOSIS — K5900 Constipation, unspecified: Secondary | ICD-10-CM

## 2017-08-22 DIAGNOSIS — R140 Abdominal distension (gaseous): Secondary | ICD-10-CM

## 2017-08-23 ENCOUNTER — Other Ambulatory Visit: Payer: Self-pay | Admitting: Nurse Practitioner

## 2017-09-04 ENCOUNTER — Ambulatory Visit: Payer: Medicare Other | Admitting: Endocrinology

## 2017-09-04 ENCOUNTER — Encounter: Payer: Self-pay | Admitting: Endocrinology

## 2017-09-04 VITALS — BP 120/51 | HR 62 | Ht 62.5 in | Wt 140.0 lb

## 2017-09-04 DIAGNOSIS — E1122 Type 2 diabetes mellitus with diabetic chronic kidney disease: Secondary | ICD-10-CM

## 2017-09-04 DIAGNOSIS — N183 Chronic kidney disease, stage 3 (moderate): Secondary | ICD-10-CM | POA: Diagnosis not present

## 2017-09-04 LAB — POCT GLYCOSYLATED HEMOGLOBIN (HGB A1C): Hemoglobin A1C: 6.2 % — AB (ref 4.0–5.6)

## 2017-09-04 NOTE — Patient Instructions (Addendum)
check your blood sugar once a day.  vary the time of day when you check, between before the 3 meals, and at bedtime.  also check if you have symptoms of your blood sugar being too high or too low.  please keep a record of the readings and bring it to your next appointment here (or you can bring the meter itself).  You can write it on any piece of paper.  please call us sooner if your blood sugar goes below 70, or if you have a lot of readings over 200.   Please continue the same medications.  Please come back for a follow-up appointment in 6 months.

## 2017-09-04 NOTE — Progress Notes (Signed)
Subjective:    Patient ID: [name], female    DOB: 03-24-1950, 67 y.o.   MRN: [id number]  HPI Pt returns for f/u of diabetes mellitus:  DM type: 2, but she may be developing type 1 Dx'ed: 6761 Complications: polyneuropathy, renal insuff, CVA's, and PDR Therapy: 2 oral meds GDM: never DKA: never Severe hypoglycemia: never.   Pancreatitis: never Pancreatic imaging: never Other: she cannot afford brand-name meds; she has never been on insulin, but she has taken trulicity; renal insuff limits metformin dosage; edema limits rx options.   Interval history:  no cbg record, but states cbg's are well-controlled.  She takes repaglinide just 1/2 tab 3 times a day (just before each meal).   pt states she feels well in general.   Past Medical History:  Diagnosis Date  . Chronic back pain   . Cough   . Diabetes mellitus, type 2 ([place])   . Fibroid    patient thinks this was the reason for her hysterectomy  . GERD (gastroesophageal reflux disease)   . History of sebaceous cyst   . Hyperlipemia   . Hypertension   . Hyperthyroidism   . Lumbar radiculopathy   . Shortness of breath 09/13/2013  . Stroke ([place])   . Tobacco abuse     Past Surgical History:  Procedure Laterality Date  . ABDOMINAL HYSTERECTOMY     --?ovaries remain  . RETINOPATHY SURGERY  2013   Dr. Zigmund Daniel    Social History   Socioeconomic History  . Marital status: Divorced    Spouse name: Not on file  . Number of children: 1  . Years of education: 53  . Highest education level: Not on file  Occupational History  . Occupation: Insurance account manager: [employer]  Social Needs  . Financial resource strain: Not hard at all  . Food insecurity:    Worry: Never true    Inability: Never true  . Transportation needs:    Medical: No    Non-medical: No  Tobacco Use  . Smoking status: Former Smoker    Packs/day: 0.50    Years: 35.00    Pack years: 17.50    Types: Cigarettes    Last attempt to  quit: 07/07/2014    Years since quitting: 3.1  . Smokeless tobacco: Never Used  . Tobacco comment: patient advised to keep diary, develop strategy and stop  Substance and Sexual Activity  . Alcohol use: No  . Drug use: No  . Sexual activity: Never    Partners: Male    Birth control/protection: Surgical    Comment: TAH--?ovaries remain  Lifestyle  . Physical activity:    Days per week: 6 days    Minutes per session: 60 min  . Stress: Not at all  Relationships  . Social connections:    Talks on phone: More than three times a week    Gets together: More than three times a week    Attends religious service: More than 4 times per year    Active member of club or organization: Yes    Attends meetings of clubs or organizations: More than 4 times per year    Relationship status: Divorced  . Intimate partner violence:    Fear of current or ex partner: Not on file    Emotionally abused: Not on file    Physically abused: Not on file    Forced sexual activity: Not on file  Other Topics Concern  . Not on  file  Social History Narrative   Denies abuse and feels safe at home.    Current Outpatient Medications on File Prior to Visit  Medication Sig Dispense Refill  . amLODipine (NORVASC) 10 MG tablet Take 1 tablet (10 mg total) by mouth daily. 90 tablet 3  . aspirin 325 MG tablet Take 325 mg by mouth daily.    [name] atorvastatin (LIPITOR) 80 MG tablet TAKE 1 TABLET DAILY. FOLLOW-UP APPT IS DUE NEED LIPIDS CHECK MUST SEE PROVIDER FOR FUTURE REFILLS 90 tablet 1  . calcitRIOL (ROCALTROL) 0.25 MCG capsule 1 (ONE) CAPSULE THREE TIMES A WEEK  5  . cloNIDine (CATAPRES) 0.2 MG tablet Take 1 tablet (0.2 mg total) by mouth 3 (three) times daily. 90 tablet 0  . furosemide (LASIX) 40 MG tablet Take 40 mg by mouth as needed.     [name] glucose blood (ONETOUCH VERIO) test strip USE AS INSTRUCTED (TESTS 2 TIMES A DAY) 100 each 5  . metFORMIN (GLUCOPHAGE-XR) 500 MG 24 hr tablet Take 1 tablet (500 mg total) daily  with breakfast by mouth. 90 tablet 3  . metoprolol succinate (TOPROL-XL) 100 MG 24 hr tablet TAKE 1 TABLET BY MOUTH DAILY FOR HIGH BLOOD PRESSURE 90 tablet 3  . minoxidil (LONITEN) 2.5 MG tablet Take 2.5 mg by mouth 2 (two) times daily.    [name] test strip USE AS INSTRUCTED (TESTS 2 TIMES A DAY) 100 each 5  . repaglinide (PRANDIN) 2 MG tablet Take 0.5 tablets (1 mg total) by mouth 3 (three) times daily before meals. 50 tablet 11  . telmisartan-hydrochlorothiazide (MICARDIS HCT) 80-25 MG tablet TAKE 1 TABLET BY MOUTH EVERY DAY 90 tablet 1  . Vitamin D, Ergocalciferol, (DRISDOL) 50000 units CAPS capsule 1 (ONE) CAPSULE TWICE A WEEK  5   No current facility-administered medications on file prior to visit.     No Known Allergies  Family History  Problem Relation Age of Onset  . Hypertension Mother   . Sleep apnea Mother   . Diabetes Mother   . Hyperlipidemia Mother   . Cancer Father        lung cancer - smoker  . Hypertension Sister   . Diabetes Sister   . Hypertension Brother   . Hypertension Sister   . Diabetes Brother   . Hypertension Brother   . Breast cancer Neg Hx     BP (!) 120/51   Pulse 62   Ht 5' 2.5" (1.588 m)   Wt 140 lb (63.5 kg)   SpO2 96%   BMI 25.20 kg/m    Review of Systems She denies hypoglycemia    Objective:   Physical Exam VITAL SIGNS:  See vs page GENERAL: no distress Pulses: foot pulses are intact bilaterally.   MSK: no deformity of the feet or ankles.  CV: trace bilat edema of the legs. Skin:  no ulcer on the feet or ankles, but the skin is dry.  normal color and temp on the feet and ankles Neuro: sensation is intact to touch on the feet and ankles Ext: There is bilateral onychomycosis of the toenails.    Lab Results  Component Value Date   HGBA1C 6.2 (A) 09/04/2017       Assessment & Plan:  Type 2 DM, wt renal insuff: well-controlled Edema: this limits rx options.  Patient Instructions  check your blood sugar once a day.   vary the time of day when you check, between before the 3 meals, and at bedtime.  also  check if you have symptoms of your blood sugar being too high or too low.  please keep a record of the readings and bring it to your next appointment here (or you can bring the meter itself).  You can write it on any piece of paper.  please call us sooner if your blood sugar goes below 70, or if you have a lot of readings over 200.   Please continue the same medications.  Please come back for a follow-up appointment in 6 months.

## 2017-09-08 ENCOUNTER — Encounter (HOSPITAL_COMMUNITY): Payer: Self-pay | Admitting: Emergency Medicine

## 2017-09-08 ENCOUNTER — Ambulatory Visit (INDEPENDENT_AMBULATORY_CARE_PROVIDER_SITE_OTHER): Payer: Medicare Other | Admitting: Nurse Practitioner

## 2017-09-08 ENCOUNTER — Telehealth: Payer: Self-pay

## 2017-09-08 ENCOUNTER — Observation Stay (HOSPITAL_COMMUNITY)
Admission: EM | Admit: 2017-09-08 | Discharge: 2017-09-09 | Disposition: A | Discharge status: Home / Self Care | Payer: Medicare Other | Attending: Internal Medicine | Admitting: Internal Medicine

## 2017-09-08 ENCOUNTER — Other Ambulatory Visit (INDEPENDENT_AMBULATORY_CARE_PROVIDER_SITE_OTHER): Payer: Medicare Other

## 2017-09-08 ENCOUNTER — Encounter: Payer: Self-pay | Admitting: Nurse Practitioner

## 2017-09-08 VITALS — BP 120/62 | HR 64 | Temp 98.1°F | Resp 16 | Ht 62.5 in | Wt 132.8 lb

## 2017-09-08 DIAGNOSIS — Z79899 Other long term (current) drug therapy: Secondary | ICD-10-CM | POA: Insufficient documentation

## 2017-09-08 DIAGNOSIS — N184 Chronic kidney disease, stage 4 (severe): Secondary | ICD-10-CM | POA: Diagnosis not present

## 2017-09-08 DIAGNOSIS — R200 Anesthesia of skin: Secondary | ICD-10-CM | POA: Diagnosis not present

## 2017-09-08 DIAGNOSIS — M79604 Pain in right leg: Secondary | ICD-10-CM | POA: Diagnosis not present

## 2017-09-08 DIAGNOSIS — T452X5A Adverse effect of vitamins, initial encounter: Secondary | ICD-10-CM | POA: Insufficient documentation

## 2017-09-08 DIAGNOSIS — T452X1A Poisoning by vitamins, accidental (unintentional), initial encounter: Secondary | ICD-10-CM | POA: Diagnosis present

## 2017-09-08 DIAGNOSIS — E785 Hyperlipidemia, unspecified: Secondary | ICD-10-CM | POA: Diagnosis not present

## 2017-09-08 DIAGNOSIS — E871 Hypo-osmolality and hyponatremia: Secondary | ICD-10-CM | POA: Diagnosis not present

## 2017-09-08 DIAGNOSIS — N179 Acute kidney failure, unspecified: Secondary | ICD-10-CM | POA: Diagnosis not present

## 2017-09-08 DIAGNOSIS — M79605 Pain in left leg: Secondary | ICD-10-CM

## 2017-09-08 DIAGNOSIS — Z8673 Personal history of transient ischemic attack (TIA), and cerebral infarction without residual deficits: Secondary | ICD-10-CM | POA: Diagnosis not present

## 2017-09-08 DIAGNOSIS — R270 Ataxia, unspecified: Principal | ICD-10-CM | POA: Insufficient documentation

## 2017-09-08 DIAGNOSIS — Z87891 Personal history of nicotine dependence: Secondary | ICD-10-CM | POA: Diagnosis not present

## 2017-09-08 DIAGNOSIS — D631 Anemia in chronic kidney disease: Secondary | ICD-10-CM | POA: Diagnosis not present

## 2017-09-08 DIAGNOSIS — R2689 Other abnormalities of gait and mobility: Secondary | ICD-10-CM | POA: Diagnosis not present

## 2017-09-08 DIAGNOSIS — E878 Other disorders of electrolyte and fluid balance, not elsewhere classified: Secondary | ICD-10-CM | POA: Diagnosis not present

## 2017-09-08 DIAGNOSIS — E1122 Type 2 diabetes mellitus with diabetic chronic kidney disease: Secondary | ICD-10-CM | POA: Insufficient documentation

## 2017-09-08 DIAGNOSIS — Z7984 Long term (current) use of oral hypoglycemic drugs: Secondary | ICD-10-CM | POA: Diagnosis not present

## 2017-09-08 DIAGNOSIS — E860 Dehydration: Secondary | ICD-10-CM | POA: Insufficient documentation

## 2017-09-08 DIAGNOSIS — K219 Gastro-esophageal reflux disease without esophagitis: Secondary | ICD-10-CM | POA: Insufficient documentation

## 2017-09-08 DIAGNOSIS — I129 Hypertensive chronic kidney disease with stage 1 through stage 4 chronic kidney disease, or unspecified chronic kidney disease: Secondary | ICD-10-CM | POA: Diagnosis not present

## 2017-09-08 DIAGNOSIS — Z7982 Long term (current) use of aspirin: Secondary | ICD-10-CM | POA: Insufficient documentation

## 2017-09-08 DIAGNOSIS — N189 Chronic kidney disease, unspecified: Secondary | ICD-10-CM

## 2017-09-08 DIAGNOSIS — R202 Paresthesia of skin: Secondary | ICD-10-CM

## 2017-09-08 DIAGNOSIS — E1165 Type 2 diabetes mellitus with hyperglycemia: Secondary | ICD-10-CM | POA: Diagnosis not present

## 2017-09-08 DIAGNOSIS — I10 Essential (primary) hypertension: Secondary | ICD-10-CM | POA: Diagnosis present

## 2017-09-08 DIAGNOSIS — I160 Hypertensive urgency: Secondary | ICD-10-CM | POA: Diagnosis present

## 2017-09-08 DIAGNOSIS — R531 Weakness: Secondary | ICD-10-CM

## 2017-09-08 LAB — URINALYSIS, ROUTINE W REFLEX MICROSCOPIC
BACTERIA UA: NONE SEEN
BILIRUBIN URINE: NEGATIVE
Glucose, UA: NEGATIVE mg/dL
Hgb urine dipstick: NEGATIVE
Ketones, ur: NEGATIVE mg/dL
Leukocytes, UA: NEGATIVE
Nitrite: NEGATIVE
PROTEIN: 30 mg/dL — AB
Specific Gravity, Urine: 1.009 (ref 1.005–1.030)
pH: 5 (ref 5.0–8.0)

## 2017-09-08 LAB — CBC WITH DIFFERENTIAL/PLATELET
BASOS ABS: 0 10*3/uL (ref 0.0–0.1)
BASOS PCT: 1 %
Eosinophils Absolute: 0 10*3/uL (ref 0.0–0.7)
Eosinophils Relative: 1 %
HCT: 24.6 % — ABNORMAL LOW (ref 36.0–46.0)
HEMOGLOBIN: 8.1 g/dL — AB (ref 12.0–15.0)
LYMPHS ABS: 2.2 10*3/uL (ref 0.7–4.0)
LYMPHS PCT: 48 %
MCH: 31.6 pg (ref 26.0–34.0)
MCHC: 32.9 g/dL (ref 30.0–36.0)
MCV: 96.1 fL (ref 78.0–100.0)
MONOS PCT: 19 %
Monocytes Absolute: 0.9 10*3/uL (ref 0.1–1.0)
NEUTROS ABS: 1.4 10*3/uL — AB (ref 1.7–7.7)
Neutrophils Relative %: 31 %
Platelets: 137 10*3/uL — ABNORMAL LOW (ref 150–400)
RBC: 2.56 MIL/uL — ABNORMAL LOW (ref 3.87–5.11)
RDW: 13.2 % (ref 11.5–15.5)
WBC: 4.5 10*3/uL (ref 4.0–10.5)

## 2017-09-08 LAB — COMPREHENSIVE METABOLIC PANEL
ALBUMIN: 3.2 g/dL — AB (ref 3.5–5.0)
ALT: 24 U/L (ref 0–44)
AST: 29 U/L (ref 15–41)
Alkaline Phosphatase: 71 U/L (ref 38–126)
Anion gap: 10 (ref 5–15)
BUN: 66 mg/dL — AB (ref 8–23)
CHLORIDE: 97 mmol/L — AB (ref 98–111)
CO2: 23 mmol/L (ref 22–32)
Calcium: 8.4 mg/dL — ABNORMAL LOW (ref 8.9–10.3)
Creatinine, Ser: 2.7 mg/dL — ABNORMAL HIGH (ref 0.44–1.00)
GFR calc Af Amer: 20 mL/min — ABNORMAL LOW (ref >60)
GFR calc non Af Amer: 17 mL/min — ABNORMAL LOW (ref >60)
GLUCOSE: 314 mg/dL — AB (ref 70–99)
POTASSIUM: 4.5 mmol/L (ref 3.5–5.1)
SODIUM: 130 mmol/L — AB (ref 135–145)
Total Bilirubin: 1.3 mg/dL — ABNORMAL HIGH (ref 0.3–1.2)
Total Protein: 6.9 g/dL (ref 6.5–8.1)

## 2017-09-08 LAB — BASIC METABOLIC PANEL
BUN: 70 mg/dL — AB (ref 6–23)
CO2: 26 meq/L (ref 19–32)
CREATININE: 2.62 mg/dL — AB (ref 0.40–1.20)
Calcium: 8.7 mg/dL (ref 8.4–10.5)
Chloride: 97 mEq/L (ref 96–112)
GFR: 23.36 mL/min — ABNORMAL LOW (ref >60.00)
Glucose, Bld: 215 mg/dL — ABNORMAL HIGH (ref 70–99)
Potassium: 4 mEq/L (ref 3.5–5.1)
Sodium: 130 mEq/L — ABNORMAL LOW (ref 135–145)

## 2017-09-08 LAB — SEDIMENTATION RATE: SED RATE: 47 mm/h — AB (ref 0–30)

## 2017-09-08 LAB — VITAMIN D 25 HYDROXY (VIT D DEFICIENCY, FRACTURES): VITD: >120 ng/mL (ref 30.00–100.00)

## 2017-09-08 LAB — TSH: TSH: 1.56 u[IU]/mL (ref 0.35–4.50)

## 2017-09-08 LAB — VITAMIN B12: VITAMIN B 12: 462 pg/mL (ref 211–911)

## 2017-09-08 NOTE — Telephone Encounter (Signed)
Received critical value from lab of Vitamin D 120.56. Please advise.

## 2017-09-08 NOTE — Patient Instructions (Addendum)
Please head downstairs for lab work. If any of your test results are critically abnormal, you will be contacted right away. Otherwise, I will contact you within a week about your test results and follow up recommendations.

## 2017-09-08 NOTE — Progress Notes (Signed)
Name: [name]   MRN: [id number]    DOB: 1950-11-19   Date:09/08/2017       Progress Note  Subjective  Chief Complaint  Chief Complaint  Patient presents with  . Leg Pain    bilateral leg pain, off and on for about a month, could not sleep last night very well,     HPI  [name] is here today for evaluation of acute complaint of bilateral lower leg pain, noticed for past few months intermittently. She says her legs feel stiff and numb and sometimes her knees feel like they are rubbing together. The symptoms occur daily throughout the day, do not seem related to any specific activity. She tells me that about 3 weeks ago she started a new exercise plan, walking every morning for about 30 minutes, however her leg pain seemed to start prior to that. She denies fevers, back pain, chest pain, shortness of breath, erythema, edema, falls. She has not tried anything at home for the pain. She is maintained on atorvastatin, which she has been on for many years now.  Patient Active Problem List   Diagnosis Date Noted  . Olecranon bursitis of right elbow 03/09/2017  . Thyroid nodule 06/29/2016  . Type 2 diabetes mellitus ([place]) 06/13/2016  . Vitamin D deficiency 01/12/2016  . Medicare annual wellness visit, initial 01/11/2016  . Lacunar infarct, acute ([place]) 03/23/2015  . Non compliance w medication regimen 01/16/2015  . Anemia 01/08/2015  . Acute kidney injury ([place]) 01/08/2015  . Stroke ([place]) 01/08/2015  . Hyperlipemia   . Carotid bruit present 12/13/2012  . Routine health maintenance 10/24/2010  . PERIPHERAL EDEMA 09/10/2009  . ELECTROCARDIOGRAM, ABNORMAL 04/11/2008  . HEART MURMUR, SYSTOLIC 22/97/9892  . SEBACEOUS CYST, NECK 12/13/2006  . Hyperthyroidism 09/27/2006  . Secondary diabetes with peripheral vascular disease ([place]) 09/27/2006  . Hyperlipidemia LDL goal <70 09/27/2006  . TOBACCO ABUSE 09/27/2006  . Essential hypertension 09/27/2006    Past Surgical History:   Procedure Laterality Date  . ABDOMINAL HYSTERECTOMY     --?ovaries remain  . RETINOPATHY SURGERY  2013   Dr. Zigmund Daniel    Family History  Problem Relation Age of Onset  . Hypertension Mother   . Sleep apnea Mother   . Diabetes Mother   . Hyperlipidemia Mother   . Cancer Father        lung cancer - smoker  . Hypertension Sister   . Diabetes Sister   . Hypertension Brother   . Hypertension Sister   . Diabetes Brother   . Hypertension Brother   . Breast cancer Neg Hx     Social History   Socioeconomic History  . Marital status: Divorced    Spouse name: Not on file  . Number of children: 1  . Years of education: 20  . Highest education level: Not on file  Occupational History  . Occupation: Insurance account manager: [employer]  Social Needs  . Financial resource strain: Not hard at all  . Food insecurity:    Worry: Never true    Inability: Never true  . Transportation needs:    Medical: No    Non-medical: No  Tobacco Use  . Smoking status: Former Smoker    Packs/day: 0.50    Years: 35.00    Pack years: 17.50    Types: Cigarettes    Last attempt to quit: 07/07/2014    Years since quitting: 3.1  . Smokeless tobacco: Never Used  . Tobacco  comment: patient advised to keep diary, develop strategy and stop  Substance and Sexual Activity  . Alcohol use: No  . Drug use: No  . Sexual activity: Never    Partners: Male    Birth control/protection: Surgical    Comment: TAH--?ovaries remain  Lifestyle  . Physical activity:    Days per week: 6 days    Minutes per session: 60 min  . Stress: Not at all  Relationships  . Social connections:    Talks on phone: More than three times a week    Gets together: More than three times a week    Attends religious service: More than 4 times per year    Active member of club or organization: Yes    Attends meetings of clubs or organizations: More than 4 times per year    Relationship status: Divorced  . Intimate  partner violence:    Fear of current or ex partner: Not on file    Emotionally abused: Not on file    Physically abused: Not on file    Forced sexual activity: Not on file  Other Topics Concern  . Not on file  Social History Narrative   Denies abuse and feels safe at home.     Current Outpatient Medications:  .  amLODipine (NORVASC) 10 MG tablet, Take 1 tablet (10 mg total) by mouth daily., Disp: 90 tablet, Rfl: 3 .  aspirin 325 MG tablet, Take 325 mg by mouth daily., Disp: , Rfl:  .  atorvastatin (LIPITOR) 80 MG tablet, TAKE 1 TABLET DAILY. FOLLOW-UP APPT IS DUE NEED LIPIDS CHECK MUST SEE PROVIDER FOR FUTURE REFILLS, Disp: 90 tablet, Rfl: 1 .  calcitRIOL (ROCALTROL) 0.25 MCG capsule, 1 (ONE) CAPSULE THREE TIMES A WEEK, Disp: , Rfl: 5 .  cloNIDine (CATAPRES) 0.2 MG tablet, Take 1 tablet (0.2 mg total) by mouth 3 (three) times daily., Disp: 90 tablet, Rfl: 0 .  furosemide (LASIX) 40 MG tablet, Take 40 mg by mouth as needed. , Disp: , Rfl:  .  glucose blood (ONETOUCH VERIO) test strip, USE AS INSTRUCTED (TESTS 2 TIMES A DAY), Disp: 100 each, Rfl: 5 .  metFORMIN (GLUCOPHAGE-XR) 500 MG 24 hr tablet, Take 1 tablet (500 mg total) daily with breakfast by mouth., Disp: 90 tablet, Rfl: 3 .  metoprolol succinate (TOPROL-XL) 100 MG 24 hr tablet, TAKE 1 TABLET BY MOUTH DAILY FOR HIGH BLOOD PRESSURE, Disp: 90 tablet, Rfl: 3 .  minoxidil (LONITEN) 2.5 MG tablet, Take 2.5 mg by mouth 2 (two) times daily., Disp: , Rfl:  .  ONETOUCH VERIO test strip, USE AS INSTRUCTED (TESTS 2 TIMES A DAY), Disp: 100 each, Rfl: 5 .  repaglinide (PRANDIN) 2 MG tablet, Take 0.5 tablets (1 mg total) by mouth 3 (three) times daily before meals., Disp: 50 tablet, Rfl: 11 .  telmisartan-hydrochlorothiazide (MICARDIS HCT) 80-25 MG tablet, TAKE 1 TABLET BY MOUTH EVERY DAY, Disp: 90 tablet, Rfl: 1 .  Vitamin D, Ergocalciferol, (DRISDOL) 50000 units CAPS capsule, 1 (ONE) CAPSULE TWICE A WEEK, Disp: , Rfl: 5  No Known  Allergies   ROS See HPI  Objective  Vitals:   09/08/17 1434  BP: 120/62  Pulse: 64  Resp: 16  Temp: 98.1 F (36.7 C)  TempSrc: Oral  SpO2: 96%  Weight: 132 lb 12.8 oz (60.2 kg)  Height: 5' 2.5" (1.588 m)    Body mass index is 23.9 kg/m.  Physical Exam Vital signs reviewed. Constitutional: Patient appears well-developed and well-nourished. No distress.  HENT: Head: Normocephalic and atraumatic.  Nose: Nose normal. Mouth/Throat: Oropharynx is clear and moist. No oropharyngeal exudate.  Eyes: Conjunctivae and EOM are normal. Pupils are equal, round, and reactive to light. No scleral icterus.  Neck: Normal range of motion. Neck supple.   Cardiovascular: Normal rate, regular rhythm. Murmur heard. No BLE edema. Distal pulses intact. Pulmonary/Chest: Effort normal and breath sounds normal. No respiratory distress. Musculoskeletal: Normal range of motion, No gross deformities Neurological:She is alert and oriented to person, place, and time. No cranial nerve deficit. Coordination, balance, strength, speech are normal. Ambulatory with cane.  Skin: Skin is warm and dry. No rash noted. No erythema. Darkened coloration of skin to BLE. Psychiatric: Patient has a normal mood and affect. behavior is normal. Judgment and thought content normal.   Assessment & Plan F/U TBD pending lab results 09/04/17 Hgb A1c WNL  -Reviewed Health Maintenance: up to date  1. Paresthesia, Pain in both lower extremities Venous insufficiency or statin therapy could be contributory Will order additional labs today and F/U with further recommendations pending lab results - ANA; Future - Sed Rate (ESR); Future - Basic metabolic panel; Future - TSH; Future - Vitamin B12; Future - VITAMIN D 25 Hydroxy (Vit-D Deficiency, Fractures); Future

## 2017-09-08 NOTE — ED Triage Notes (Signed)
Patient advised by her PCP to go to ER due to abnormal blood tests results drawn this morning during her check up  , pt. added intermittent low legs pain when ambulating for several days .

## 2017-09-08 NOTE — ED Provider Notes (Signed)
Patient placed in Quick Look pathway, seen and evaluated   Chief Complaint: Elevated creatinine, critical high vitamin D levels  HPI: Patient with history of chronic kidney disease presents after being told to come to the emergency department by her primary care doctor.  She had labs drawn earlier today after she was seen for intermittent leg pain with ambulation.  She states that last night that her legs were aching and restless.  She had critically high vitamin D level with normal calcium and elevated creatinine.  2 months ago creatinine was 2.0 and now it is 2.6.  Patient currently denies any pain in her legs.  She has mild swelling in her left leg.  No history of blood clots.  No chest pain or shortness of breath.  Per patient records, she takes 50,000 units of vitamin D twice a week.  ROS:  Positive ROS: (+) Leg pain Negative ROS: (-) Weakness, chest pain, shortness of breath  Physical Exam:   Gen: No distress  Neuro: Awake and Alert  Skin: Warm    Focused Exam: Heart RRR, nml S1,S2, no m/r/g; Lungs CTAB; Abd soft, NT, no rebound or guarding; Ext 2+ pedal pulses bilaterally, trace edema to the left lower leg without tenderness.  BP (!) 128/55 (BP Location: Right Arm)   Pulse 65   Temp 98.6 F (37 C) (Oral)   Resp 14   Ht 5\' 2"  (1.575 m)   Wt 63 kg   SpO2 99%   BMI 25.42 kg/m   Plan: Basic labs.  Low concern for DVT.  Initiation of care has begun. The patient has been counseled on the process, plan, and necessity for staying for the completion/evaluation, and the remainder of the medical screening examination    Carlisle Cater, Hershal Coria 09/08/17 1948    Carmin Muskrat, MD 09/10/17 0020

## 2017-09-09 ENCOUNTER — Encounter (HOSPITAL_COMMUNITY): Payer: Self-pay

## 2017-09-09 ENCOUNTER — Other Ambulatory Visit: Payer: Self-pay

## 2017-09-09 ENCOUNTER — Emergency Department (HOSPITAL_COMMUNITY): Payer: Medicare Other

## 2017-09-09 ENCOUNTER — Observation Stay (HOSPITAL_COMMUNITY): Payer: Medicare Other

## 2017-09-09 DIAGNOSIS — N189 Chronic kidney disease, unspecified: Secondary | ICD-10-CM

## 2017-09-09 DIAGNOSIS — R270 Ataxia, unspecified: Secondary | ICD-10-CM

## 2017-09-09 DIAGNOSIS — T452X1A Poisoning by vitamins, accidental (unintentional), initial encounter: Secondary | ICD-10-CM | POA: Diagnosis present

## 2017-09-09 DIAGNOSIS — E1165 Type 2 diabetes mellitus with hyperglycemia: Secondary | ICD-10-CM | POA: Diagnosis not present

## 2017-09-09 DIAGNOSIS — N179 Acute kidney failure, unspecified: Secondary | ICD-10-CM | POA: Diagnosis not present

## 2017-09-09 DIAGNOSIS — N184 Chronic kidney disease, stage 4 (severe): Secondary | ICD-10-CM | POA: Diagnosis present

## 2017-09-09 DIAGNOSIS — R2689 Other abnormalities of gait and mobility: Secondary | ICD-10-CM | POA: Diagnosis not present

## 2017-09-09 LAB — TSH: TSH: 1.854 u[IU]/mL (ref 0.350–4.500)

## 2017-09-09 LAB — BASIC METABOLIC PANEL
Anion gap: 10 (ref 5–15)
BUN: 59 mg/dL — ABNORMAL HIGH (ref 8–23)
CALCIUM: 8.6 mg/dL — AB (ref 8.9–10.3)
CO2: 23 mmol/L (ref 22–32)
CREATININE: 2.25 mg/dL — AB (ref 0.44–1.00)
Chloride: 101 mmol/L (ref 98–111)
GFR, EST AFRICAN AMERICAN: 25 mL/min — AB (ref >60)
GFR, EST NON AFRICAN AMERICAN: 21 mL/min — AB (ref >60)
Glucose, Bld: 175 mg/dL — ABNORMAL HIGH (ref 70–99)
Potassium: 4.4 mmol/L (ref 3.5–5.1)
Sodium: 134 mmol/L — ABNORMAL LOW (ref 135–145)

## 2017-09-09 LAB — CBC WITH DIFFERENTIAL/PLATELET
BASOS ABS: 0 10*3/uL (ref 0.0–0.1)
BASOS PCT: 0 %
Eosinophils Absolute: 0 10*3/uL (ref 0.0–0.7)
Eosinophils Relative: 1 %
HCT: 24.2 % — ABNORMAL LOW (ref 36.0–46.0)
Hemoglobin: 8.1 g/dL — ABNORMAL LOW (ref 12.0–15.0)
LYMPHS ABS: 2.1 10*3/uL (ref 0.7–4.0)
LYMPHS PCT: 44 %
MCH: 31.5 pg (ref 26.0–34.0)
MCHC: 33.5 g/dL (ref 30.0–36.0)
MCV: 94.2 fL (ref 78.0–100.0)
MONO ABS: 0.6 10*3/uL (ref 0.1–1.0)
Monocytes Relative: 14 %
NEUTROS ABS: 1.8 10*3/uL (ref 1.7–7.7)
Neutrophils Relative %: 41 %
Platelets: 153 10*3/uL (ref 150–400)
RBC: 2.57 MIL/uL — ABNORMAL LOW (ref 3.87–5.11)
RDW: 13.1 % (ref 11.5–15.5)
WBC: 4.5 10*3/uL (ref 4.0–10.5)

## 2017-09-09 LAB — MAGNESIUM: MAGNESIUM: 2.1 mg/dL (ref 1.7–2.4)

## 2017-09-09 LAB — HEPATIC FUNCTION PANEL
ALT: 22 U/L (ref 0–44)
AST: 25 U/L (ref 15–41)
Albumin: 3 g/dL — ABNORMAL LOW (ref 3.5–5.0)
Alkaline Phosphatase: 67 U/L (ref 38–126)
BILIRUBIN DIRECT: 0.2 mg/dL (ref 0.0–0.2)
BILIRUBIN INDIRECT: 1 mg/dL — AB (ref 0.3–0.9)
BILIRUBIN TOTAL: 1.2 mg/dL (ref 0.3–1.2)
Total Protein: 6.8 g/dL (ref 6.5–8.1)

## 2017-09-09 LAB — GLUCOSE, CAPILLARY
GLUCOSE-CAPILLARY: 136 mg/dL — AB (ref 70–99)
Glucose-Capillary: 145 mg/dL — ABNORMAL HIGH (ref 70–99)
Glucose-Capillary: 137 mg/dL — ABNORMAL HIGH (ref 70–99)

## 2017-09-09 LAB — PHOSPHORUS: Phosphorus: 3.2 mg/dL (ref 2.5–4.6)

## 2017-09-09 LAB — HIV ANTIBODY (ROUTINE TESTING W REFLEX): HIV SCREEN 4TH GENERATION: NONREACTIVE

## 2017-09-09 LAB — VITAMIN B12: VITAMIN B 12: 453 pg/mL (ref 180–914)

## 2017-09-09 MED ORDER — INSULIN ASPART 100 UNIT/ML ~~LOC~~ SOLN
0.0000 [IU] | Freq: Three times a day (TID) | SUBCUTANEOUS | Status: DC
  Administered 2017-09-09 (×3): 1 [IU] via SUBCUTANEOUS

## 2017-09-09 MED ORDER — MINOXIDIL 2.5 MG PO TABS
2.5000 mg | ORAL_TABLET | Freq: Two times a day (BID) | ORAL | Status: DC
  Administered 2017-09-09: 2.5 mg via ORAL
  Filled 2017-09-09 (×2): qty 1

## 2017-09-09 MED ORDER — AMLODIPINE BESYLATE 10 MG PO TABS
10.0000 mg | ORAL_TABLET | Freq: Every day | ORAL | Status: DC
  Administered 2017-09-09: 10 mg via ORAL
  Filled 2017-09-09: qty 1

## 2017-09-09 MED ORDER — ONDANSETRON HCL 4 MG PO TABS: 4.0000 mg | ORAL_TABLET | Freq: Four times a day (QID) | ORAL | Status: DC | PRN

## 2017-09-09 MED ORDER — TELMISARTAN-HCTZ 80-25 MG PO TABS: 1.0000 | ORAL_TABLET | Freq: Every day | ORAL | 1 refills | Status: DC

## 2017-09-09 MED ORDER — B-12 100 MCG PO TABS
1.0000 | ORAL_TABLET | Freq: Every day | ORAL | 0 refills | Status: DC
Start: 2017-09-09 — End: 2017-10-12

## 2017-09-09 MED ORDER — METOPROLOL SUCCINATE ER 100 MG PO TB24
100.0000 mg | ORAL_TABLET | Freq: Every day | ORAL | Status: DC
  Administered 2017-09-09: 100 mg via ORAL
  Filled 2017-09-09: qty 1

## 2017-09-09 MED ORDER — ACETAMINOPHEN 325 MG PO TABS: 650.0000 mg | ORAL_TABLET | Freq: Four times a day (QID) | ORAL | Status: DC | PRN

## 2017-09-09 MED ORDER — FUROSEMIDE 40 MG PO TABS: 40.0000 mg | ORAL_TABLET | ORAL | Status: DC

## 2017-09-09 MED ORDER — CYANOCOBALAMIN 1000 MCG/ML IJ SOLN
1000.0000 ug | Freq: Once | INTRAMUSCULAR | Status: AC
  Administered 2017-09-09: 1000 ug via INTRAMUSCULAR
  Filled 2017-09-09: qty 1

## 2017-09-09 MED ORDER — ACETAMINOPHEN 650 MG RE SUPP: 650.0000 mg | Freq: Four times a day (QID) | RECTAL | Status: DC | PRN

## 2017-09-09 MED ORDER — ONDANSETRON HCL 4 MG/2ML IJ SOLN: 4.0000 mg | Freq: Four times a day (QID) | INTRAMUSCULAR | Status: DC | PRN

## 2017-09-09 MED ORDER — CLONIDINE HCL 0.2 MG PO TABS
0.2000 mg | ORAL_TABLET | Freq: Three times a day (TID) | ORAL | Status: DC
  Administered 2017-09-09: 0.2 mg via ORAL
  Filled 2017-09-09: qty 1

## 2017-09-09 MED ORDER — ATORVASTATIN CALCIUM 80 MG PO TABS
80.0000 mg | ORAL_TABLET | Freq: Every day | ORAL | Status: DC
  Administered 2017-09-09: 80 mg via ORAL
  Filled 2017-09-09: qty 1

## 2017-09-09 MED ORDER — FUROSEMIDE 40 MG PO TABS: 40.0000 mg | ORAL_TABLET | Freq: Every day | ORAL | Status: DC

## 2017-09-09 MED ORDER — SODIUM CHLORIDE 0.9 % IV SOLN
INTRAVENOUS | Status: DC
  Administered 2017-09-09: 1000 mL via INTRAVENOUS

## 2017-09-09 MED ORDER — PANTOPRAZOLE SODIUM 40 MG PO TBEC: 40.0000 mg | DELAYED_RELEASE_TABLET | Freq: Every day | ORAL | 0 refills | Status: DC

## 2017-09-09 MED ORDER — ASPIRIN 325 MG PO TABS
325.0000 mg | ORAL_TABLET | Freq: Every day | ORAL | Status: DC
  Administered 2017-09-09: 325 mg via ORAL
  Filled 2017-09-09: qty 1

## 2017-09-09 MED ORDER — HEPARIN SODIUM (PORCINE) 5000 UNIT/ML IJ SOLN
5000.0000 [IU] | Freq: Three times a day (TID) | INTRAMUSCULAR | Status: DC
  Filled 2017-09-09: qty 1

## 2017-09-09 MED ORDER — SODIUM CHLORIDE 0.9 % IV BOLUS
500.0000 mL | Freq: Once | INTRAVENOUS | Status: AC
  Administered 2017-09-09: 500 mL via INTRAVENOUS

## 2017-09-09 NOTE — Progress Notes (Signed)
Initial Nutrition Assessment  DOCUMENTATION CODES:   Not applicable  INTERVENTION:  Glucerna Shake po TID, each supplement provides 220 kcal and 10 grams of protein  NUTRITION DIAGNOSIS:   Inadequate oral intake related to (Hypervitaminosis D) as evidenced by per patient/family report.  GOAL:   Patient will meet greater than or equal to 90% of their needs  MONITOR:   PO intake, Labs, Weight trends  REASON FOR ASSESSMENT:   Malnutrition Screening Tool    ASSESSMENT:   Patient with PMH CKD IV, HTN, DM2, presents with ataxia, hypervitaminosis D, AKI on CKD IV   Spoke with patient at bedside. She reports ok appetite PTA. Patient fell 3 weeks ago and states she has lost about 6 pounds since then. Per chart, she exhibits an 8 pound/5.4% severe weight loss over 1 month. Normal PO intake consists of oatmeal with toast and coffee for breakfast, potato wedges and baked chicken for lunch and greens and cabbage with a meat for dinner. Ate 100% of breakfast. Seems she felt poorly after falling and ate less, but appetite seems to have returned.  Medications reviewed and include:  B12, Insulin NS at 167mL/hr   Labs reviewed:  Na 134, BUN/Cr 59/2.25   NUTRITION - FOCUSED PHYSICAL EXAM:    Most Recent Value  Orbital Region  No depletion  Upper Arm Region  No depletion  Thoracic and Lumbar Region  No depletion  Buccal Region  No depletion  Temple Region  No depletion  Clavicle Bone Region  No depletion  Clavicle and Acromion Bone Region  No depletion  Scapular Bone Region  No depletion  Dorsal Hand  No depletion  Patellar Region  No depletion  Anterior Thigh Region  No depletion  Posterior Calf Region  No depletion  Edema (RD Assessment)  None  Hair  Reviewed  Eyes  Reviewed  Mouth  Reviewed  Skin  Reviewed  Nails  Reviewed       Diet Order:   Diet Order            Diet renal/carb modified with fluid restriction Diet-HS Snack? Nothing; Fluid restriction: 1200 mL  Fluid; Room service appropriate? Yes; Fluid consistency: Thin  Diet effective now              EDUCATION NEEDS:   No education needs have been identified at this time  Skin:  Skin Assessment: Reviewed RN Assessment  Last BM:  09/07/2017  Height:   Ht Readings from Last 1 Encounters:  09/08/17 5\' 2"  (1.575 m)    Weight:   Wt Readings from Last 1 Encounters:  09/08/17 63 kg    Ideal Body Weight:  50 kg  BMI:  Body mass index is 25.42 kg/m.  Estimated Nutritional Needs:   Kcal:  1500-1700 calories  Protein:  76-82 grams (1.2-1.3g/kg)  Fluid:  >1.5L    Satira Anis. Meily Glowacki, MS, RD LDN Inpatient Clinical Dietitian Pager [phone]

## 2017-09-09 NOTE — H&P (Signed)
History and Physical    [name] BPZ:[id number] DOB: Jun 25, 1950 DOA: 09/08/2017  PCP: Lance Sell, NP  Patient coming from: Home.  Chief Complaint: Abnormal labs.   HPI: [name] is a 67 y.o. female with history of chronic kidney disease stage IV being followed by Dr. Lorrene Reid, hypertension, diabetes mellitus type 2 was referred to the ER by patient's primary care physician of the patient's vitamin D was found to be high.  Patient has been ataxia and numbness of the left lower extremity over the last 1 week.  Among the labs vitamin D was done which showed more than 120.  It was advised to come to the ER.  ED Course: On exam patient appears nonfocal.  EKG shows normal sinus rhythm with LVH and nonspecific T wave changes.  Patient did not have any calcium vehicle but complains of numbness and muscle cramps.  Patient was given fluid bolus given that patient had acute on chronic renal failure.  CT head was unremarkable.  Review of Systems: As per HPI, rest all negative.   Past Medical History:  Diagnosis Date  . Chronic back pain   . Cough   . Diabetes mellitus, type 2 ([place])   . Fibroid    patient thinks this was the reason for her hysterectomy  . GERD (gastroesophageal reflux disease)   . History of sebaceous cyst   . Hyperlipemia   . Hypertension   . Hyperthyroidism   . Lumbar radiculopathy   . Shortness of breath 09/13/2013  . Stroke ([place])   . Tobacco abuse     Past Surgical History:  Procedure Laterality Date  . ABDOMINAL HYSTERECTOMY     --?ovaries remain  . RETINOPATHY SURGERY  2013   Dr. Zigmund Daniel     reports that she quit smoking about 3 years ago. Her smoking use included cigarettes. She has a 17.50 pack-year smoking history. She has never used smokeless tobacco. She reports that she does not drink alcohol or use drugs.  No Known Allergies  Family History  Problem Relation Age of Onset  . Hypertension Mother   . Sleep apnea Mother   .  Diabetes Mother   . Hyperlipidemia Mother   . Cancer Father        lung cancer - smoker  . Hypertension Sister   . Diabetes Sister   . Hypertension Brother   . Hypertension Sister   . Diabetes Brother   . Hypertension Brother   . Breast cancer Neg Hx     Prior to Admission medications   Medication Sig Start Date End Date Taking? Authorizing Provider  amLODipine (NORVASC) 10 MG tablet Take 1 tablet (10 mg total) by mouth daily. 03/26/15  Yes Golden Circle, FNP  aspirin 325 MG tablet Take 325 mg by mouth daily.   Yes [provider]  atorvastatin (LIPITOR) 80 MG tablet TAKE 1 TABLET DAILY. FOLLOW-UP APPT IS DUE NEED LIPIDS CHECK MUST SEE PROVIDER FOR FUTURE REFILLS 08/23/17  Yes Lance Sell, NP  calcitRIOL (ROCALTROL) 0.25 MCG capsule 1 (ONE) CAPSULE THREE TIMES A WEEK 05/17/17  Yes [provider]  cloNIDine (CATAPRES) 0.2 MG tablet Take 1 tablet (0.2 mg total) by mouth 3 (three) times daily. 08/04/17  Yes Lance Sell, NP  furosemide (LASIX) 40 MG tablet Take 40 mg by mouth See admin instructions. Take 1 tablet daily, can take another pill if ankles are swelling   Yes [provider]  glucose blood (ONETOUCH VERIO)  test strip USE AS INSTRUCTED (TESTS 2 TIMES A DAY) 03/22/17  Yes Lance Sell, NP  metFORMIN (GLUCOPHAGE-XR) 500 MG 24 hr tablet Take 1 tablet (500 mg total) daily with breakfast by mouth. 12/05/16  Yes Renato Shin, MD  metoprolol succinate (TOPROL-XL) 100 MG 24 hr tablet TAKE 1 TABLET BY MOUTH DAILY FOR HIGH BLOOD PRESSURE 03/02/17  Yes Lance Sell, NP  minoxidil (LONITEN) 2.5 MG tablet Take 2.5 mg by mouth 2 (two) times daily.   Yes [provider]  [facility] VERIO test strip USE AS INSTRUCTED (TESTS 2 TIMES A DAY) 03/23/17  Yes Shambley, Delphia Grates, NP  repaglinide (PRANDIN) 2 MG tablet Take 0.5 tablets (1 mg total) by mouth 3 (three) times daily before meals. 03/07/17  Yes Renato Shin, MD    telmisartan-hydrochlorothiazide (MICARDIS HCT) 80-25 MG tablet TAKE 1 TABLET BY MOUTH EVERY DAY 07/13/17  Yes Lance Sell, NP  Vitamin D, Ergocalciferol, (DRISDOL) 50000 units CAPS capsule 1 (ONE) CAPSULE TWICE A WEEK 02/25/17  Yes [provider]    Physical Exam: Vitals:   09/08/17 2151 09/09/17 0010 09/09/17 0417 09/09/17 0443  BP: (!) 129/56 (!) 141/55 (!) 172/62 (!) 158/58  Pulse: 62 63 81 75  Resp: 17 18 18 16   Temp:  99.3 F (37.4 C)  98.2 F (36.8 C)  TempSrc:  Oral  Oral  SpO2: 100% 99% 99% 100%  Weight:      Height:          Constitutional: Moderately built and nourished. Vitals:   09/08/17 2151 09/09/17 0010 09/09/17 0417 09/09/17 0443  BP: (!) 129/56 (!) 141/55 (!) 172/62 (!) 158/58  Pulse: 62 63 81 75  Resp: 17 18 18 16   Temp:  99.3 F (37.4 C)  98.2 F (36.8 C)  TempSrc:  Oral  Oral  SpO2: 100% 99% 99% 100%  Weight:      Height:       Eyes: Anicteric no pallor. ENMT: No discharge from the ears eyes nose or mouth. Neck: No mass felt.  No neck rigidity.  No JVD appreciated. Respiratory: No rhonchi or crepitations. Cardiovascular: S1-S2 heard no murmurs appreciated. Abdomen: Soft nontender bowel sounds present. Musculoskeletal: No edema.  No joint effusion. Skin: No rash.  Skin appears warm. Neurologic: Alert awake oriented to time place and person.  Moves all extremities 5 x 5. Psychiatric: Appears normal per normal affect.   Labs on Admission: I have personally reviewed following labs and imaging studies  CBC: Recent Labs  Lab 09/08/17 1942  WBC 4.5  NEUTROABS 1.4*  HGB 8.1*  HCT 24.6*  MCV 96.1  PLT 614*   Basic Metabolic Panel: Recent Labs  Lab 09/08/17 1515 09/08/17 1942  NA 130* 130*  K 4.0 4.5  CL 97 97*  CO2 26 23  GLUCOSE 215* 314*  BUN 70* 66*  CREATININE 2.62* 2.70*  CALCIUM 8.7 8.4*   GFR: Estimated Creatinine Clearance: 17.7 mL/min (A) (by C-G formula based on SCr of 2.7 mg/dL (H)). Liver Function  Tests: Recent Labs  Lab 09/08/17 1942  AST 29  ALT 24  ALKPHOS 71  BILITOT 1.3*  PROT 6.9  ALBUMIN 3.2*   No results for input(s): LIPASE, AMYLASE in the last 168 hours. No results for input(s): AMMONIA in the last 168 hours. Coagulation Profile: No results for input(s): INR, PROTIME in the last 168 hours. Cardiac Enzymes: No results for input(s): CKTOTAL, CKMB, CKMBINDEX, TROPONINI in the last 168 hours. BNP (last 3 results)  No results for input(s): PROBNP in the last 8760 hours. HbA1C: No results for input(s): HGBA1C in the last 72 hours. CBG: No results for input(s): GLUCAP in the last 168 hours. Lipid Profile: No results for input(s): CHOL, HDL, LDLCALC, TRIG, CHOLHDL, LDLDIRECT in the last 72 hours. Thyroid Function Tests: Recent Labs    09/08/17 1515  TSH 1.56   Anemia Panel: Recent Labs    09/08/17 1515  VITAMINB12 462   Urine analysis:    Component Value Date/Time   COLORURINE YELLOW 09/08/2017 1942   APPEARANCEUR CLEAR 09/08/2017 1942   LABSPEC 1.009 09/08/2017 1942   PHURINE 5.0 09/08/2017 1942   GLUCOSEU NEGATIVE 09/08/2017 1942   GLUCOSEU NEGATIVE 09/04/2009 0721   HGBUR NEGATIVE 09/08/2017 1942   BILIRUBINUR NEGATIVE 09/08/2017 1942   KETONESUR NEGATIVE 09/08/2017 1942   PROTEINUR 30 (A) 09/08/2017 1942   UROBILINOGEN 0.2 09/04/2009 0721   NITRITE NEGATIVE 09/08/2017 1942   LEUKOCYTESUR NEGATIVE 09/08/2017 1942   Sepsis Labs: @LABRCNTIP (procalcitonin:4,lacticidven:4) )No results found for this or any previous visit (from the past 240 hour(s)).   Radiological Exams on Admission: Ct Head Wo Contrast  Result Date: 09/09/2017 CLINICAL DATA:  Patient with loss of balance. EXAM: CT HEAD WITHOUT CONTRAST TECHNIQUE: Contiguous axial images were obtained from the base of the skull through the vertex without intravenous contrast. COMPARISON:  None. FINDINGS: Brain: Ventricles and sulci are prominent compatible with atrophy. Periventricular and  subcortical white matter hypodensity compatible with chronic microvascular ischemic changes. No evidence for acute cortically based infarct, intracranial hemorrhage, mass lesion or mass-effect. Vascular: Internal carotid arterial vascular calcifications. Skull: Intact. Sinuses/Orbits: Paranasal sinuses are unremarkable. Mastoid air cells are unremarkable. Orbits are unremarkable. Other: None. IMPRESSION: No acute intracranial process. Electronically Signed   By: Lovey Newcomer M.D.   On: 09/09/2017 02:39    EKG: Independently reviewed.  Normal sinus rhythm with LVH nonspecific ST-T changes.  Assessment/Plan Principal Problem:   Ataxia Active Problems:   Essential hypertension   Vitamin D toxicity, accidental or unintentional, initial encounter   Type 2 diabetes mellitus with hyperglycemia (H[facility])   Acute renal failure superimposed on chronic kidney disease ([place])    1. Ataxia -patient complains of difficulty walking.  Will get MRI brain.  Not sure if this is from hypovitaminosis D.  Check B12 and folate levels. 2. Hypovitaminosis D -denies any headache or any blurred vision and has no hypercalcemia.  Will hold off any calcium with vitamin D supplements.  Recheck metabolic panel. 3. Diabetes mellitus type 2 with hyperglycemia -hold oral hypoglycemics and keep patient on sliding scale coverage.  Patient may need long-acting insulin. 4. Acute on chronic renal failure stage IV with worsening renal function.  Will receive 5 cc bolus.  Will hold Lasix and ARB and HCTZ.  Follow metabolic panel. 5. Hypertension on minoxidil clonidine metoprolol amlodipine.  Hold ARB and hydrochlorothiazide due to worsening renal function. 6. Chronic anemia follow CBC.   DVT prophylaxis: Heparin. Code Status: Full code. Family Communication: Discussed with patient. Disposition Plan: Home. Consults called: None. Admission status: Observation.   Rise Patience MD [facility] Pager [phone].  If  7PM-7AM, please contact night-coverage [email or web address] Password [facility]  09/09/2017, 7:33 AM

## 2017-09-09 NOTE — Discharge Instructions (Signed)
Follow with Primary MD Maria Sell, NP in 7 days   Get CBC, CMP, checked  by Primary MD next visit.    Activity: As tolerated with Full fall precautions use walker/cane & assistance as needed   Disposition Home    Diet: Heart Healthy, carbohydrate modified, with feeding assistance and aspiration precautions.  For Heart failure patients - Check your Weight same time everyday, if you gain over 2 pounds, or you develop in leg swelling, experience more shortness of breath or chest pain, call your Primary MD immediately. Follow Cardiac Low Salt Diet and 1.5 lit/day fluid restriction.   On your next visit with your primary care physician please Get Medicines reviewed and adjusted.   Please request your Prim.MD to go over all Hospital Tests and Procedure/Radiological results at the follow up, please get all Hospital records sent to your Prim MD by signing hospital release before you go home.   If you experience worsening of your admission symptoms, develop shortness of breath, life threatening emergency, suicidal or homicidal thoughts you must seek medical attention immediately by calling 911 or calling your MD immediately  if symptoms less severe.  You Must read complete instructions/literature along with all the possible adverse reactions/side effects for all the Medicines you take and that have been prescribed to you. Take any new Medicines after you have completely understood and accpet all the possible adverse reactions/side effects.   Do not drive, operating heavy machinery, perform activities at heights, swimming or participation in water activities or provide baby sitting services if your were admitted for syncope or siezures until you have seen by Primary MD or a Neurologist and advised to do so again.  Do not drive when taking Pain medications.    Do not take more than prescribed Pain, Sleep and Anxiety Medications  Special Instructions: If you have smoked or chewed  Tobacco  in the last 2 yrs please stop smoking, stop any regular Alcohol  and or any Recreational drug use.  Wear Seat belts while driving.   Please note  You were cared for by a hospitalist during your hospital stay. If you have any questions about your discharge medications or the care you received while you were in the hospital after you are discharged, you can call the unit and asked to speak with the hospitalist on call if the hospitalist that took care of you is not available. Once you are discharged, your primary care physician will handle any further medical issues. Please note that NO REFILLS for any discharge medications will be authorized once you are discharged, as it is imperative that you return to your primary care physician (or establish a relationship with a primary care physician if you do not have one) for your aftercare needs so that they can reassess your need for medications and monitor your lab values.

## 2017-09-09 NOTE — Discharge Summary (Signed)
[name], is a 67 y.o. female  DOB December 24, 1950  MRN [id number].  Admission date:  09/08/2017  Admitting Physician  Rise Patience, MD  Discharge Date:  09/09/2017   Primary MD  Lance Sell, NP  Recommendations for primary care physician for things to follow:  -Please check CBC, BMP during next visit to ensure stable renal function   Admission Diagnosis  Hyponatremia [E87.1] Weakness [R53.1] Vitamin D toxicity, accidental or unintentional, initial encounter [T45.2X1A] Acute renal failure superimposed on chronic kidney disease, unspecified CKD stage, unspecified acute renal failure type ([place]) [N17.9, N18.9]   Discharge Diagnosis  Hyponatremia [E87.1] Weakness [R53.1] Vitamin D toxicity, accidental or unintentional, initial encounter [T45.2X1A] Acute renal failure superimposed on chronic kidney disease, unspecified CKD stage, unspecified acute renal failure type ([place]) [N17.9, N18.9]    Principal Problem:   Ataxia Active Problems:   Essential hypertension   Vitamin D toxicity, accidental or unintentional, initial encounter   Type 2 diabetes mellitus with hyperglycemia ([place])   Acute renal failure superimposed on chronic kidney disease ([place])      Past Medical History:  Diagnosis Date  . Chronic back pain   . Cough   . Diabetes mellitus, type 2 ([place])   . Fibroid    patient thinks this was the reason for her hysterectomy  . GERD (gastroesophageal reflux disease)   . History of sebaceous cyst   . Hyperlipemia   . Hypertension   . Hyperthyroidism   . Lumbar radiculopathy   . Shortness of breath 09/13/2013  . Stroke ([place])   . Tobacco abuse     Past Surgical History:  Procedure Laterality Date  . ABDOMINAL HYSTERECTOMY     --?ovaries remain  . RETINOPATHY SURGERY  2013   Dr. Zigmund Daniel       History of present illness and  Hospital Course:     Kindly see H&P for  history of present illness and admission details, please review complete Labs, Consult reports and Test reports for all details in brief  HPI  from the history and physical done on the day of admission  HPI: [name] is a 67 y.o. female with history of chronic kidney disease stage IV being followed by Dr. Lorrene Reid, hypertension, diabetes mellitus type 2 was referred to the ER by patient's primary care physician of the patient's vitamin D was found to be high.  Patient has been ataxia and numbness of the left lower extremity over the last 1 week.  Among the labs vitamin D was done which showed more than 120.  It was advised to come to the ER.  ED Course: On exam patient appears nonfocal.  EKG shows normal sinus rhythm with LVH and nonspecific T wave changes.  Patient did not have any calcium vehicle but complains of numbness and muscle cramps.  Patient was given fluid bolus given that patient had acute on chronic renal failure.  CT head was unremarkable.  Hospital Course    Ataxia/unsteady gait  - ataxia -patient complains of  difficulty walking.  Appears to be dry and dehydrated, MRI brain with no acute findings, no focal deficits, B12 on the lower side, it will be repleted, her vitamin D is significantly elevated which she instructed to hold, she was seen by PT, will discharge home with home PT.  Hypovitaminosis D -denies any headache or any blurred vision and has no hypercalcemia.    Try to to hold Calcitrol and vitamin D supplements on discharge  Diabetes mellitus type 2 with hyperglycemia -Home meds  Acute on chronic renal failure stage IV with worsening renal function.    He was kept on IV fluids overnight, renal function improved, it was 2.7 on admission, baseline is 2, it is 2.2 today, she was instructed to hold Lasix, carb/hydrochlorothiazide for next 3 days and then to resume after, to follow with PCP regarding repeat labs in 1 week .   Hypertension on minoxidil clonidine  metoprolol amlodipine.    Instructed to hold ARB, Lasix and hydrochlorothiazide for next 3 days due to worsening renal function  Anemia of chronic kidney disease -Hemoglobin 8.1 during hospital stay    Discharge Condition:  Stable   Follow UP  Follow-up Information    Lance Sell, NP Follow up in 1 week(s).   Specialty:  Nurse Practitioner Contact information: [address] [place] [phone]             Discharge Instructions  and  Discharge Medications   Discharge Instructions    Discharge instructions   Complete by:  As directed    Follow with Primary MD Lance Sell, NP in 7 days   Get CBC, CMP, checked  by Primary MD next visit.    Activity: As tolerated with Full fall precautions use walker/cane & assistance as needed   Disposition Home    Diet: Heart Healthy, carbohydrate modified, with feeding assistance and aspiration precautions.  For Heart failure patients - Check your Weight same time everyday, if you gain over 2 pounds, or you develop in leg swelling, experience more shortness of breath or chest pain, call your Primary MD immediately. Follow Cardiac Low Salt Diet and 1.5 lit/day fluid restriction.   On your next visit with your primary care physician please Get Medicines reviewed and adjusted.   Please request your Prim.MD to go over all Hospital Tests and Procedure/Radiological results at the follow up, please get all Hospital records sent to your Prim MD by signing hospital release before you go home.   If you experience worsening of your admission symptoms, develop shortness of breath, life threatening emergency, suicidal or homicidal thoughts you must seek medical attention immediately by calling 911 or calling your MD immediately  if symptoms less severe.  You Must read complete instructions/literature along with all the possible adverse reactions/side effects for all the Medicines you take and that have been  prescribed to you. Take any new Medicines after you have completely understood and accpet all the possible adverse reactions/side effects.   Do not drive, operating heavy machinery, perform activities at heights, swimming or participation in water activities or provide baby sitting services if your were admitted for syncope or siezures until you have seen by Primary MD or a Neurologist and advised to do so again.  Do not drive when taking Pain medications.    Do not take more than prescribed Pain, Sleep and Anxiety Medications  Special Instructions: If you have smoked or chewed Tobacco  in the last 2 yrs please stop smoking,  stop any regular Alcohol  and or any Recreational drug use.  Wear Seat belts while driving.   Please note  You were cared for by a hospitalist during your hospital stay. If you have any questions about your discharge medications or the care you received while you were in the hospital after you are discharged, you can call the unit and asked to speak with the hospitalist on call if the hospitalist that took care of you is not available. Once you are discharged, your primary care physician will handle any further medical issues. Please note that NO REFILLS for any discharge medications will be authorized once you are discharged, as it is imperative that you return to your primary care physician (or establish a relationship with a primary care physician if you do not have one) for your aftercare needs so that they can reassess your need for medications and monitor your lab values.   Increase activity slowly   Complete by:  As directed      Allergies as of 09/09/2017   No Known Allergies     Medication List    STOP taking these medications   calcitRIOL 0.25 MCG capsule Commonly known as:  ROCALTROL   Vitamin D (Ergocalciferol) 50000 units Caps capsule Commonly known as:  DRISDOL     TAKE these medications   amLODipine 10 MG tablet Commonly known as:  NORVASC Take  1 tablet (10 mg total) by mouth daily.   aspirin 325 MG tablet Take 325 mg by mouth daily.   atorvastatin 80 MG tablet Commonly known as:  LIPITOR TAKE 1 TABLET DAILY. FOLLOW-UP APPT IS DUE NEED LIPIDS CHECK MUST SEE PROVIDER FOR FUTURE REFILLS   B-12 100 MCG Tabs Take 1 tablet by mouth at bedtime.   cloNIDine 0.2 MG tablet Commonly known as:  CATAPRES Take 1 tablet (0.2 mg total) by mouth 3 (three) times daily.   furosemide 40 MG tablet Commonly known as:  LASIX Take 1 tablet (40 mg total) by mouth See admin instructions. Take 1 tablet daily, can take another pill if ankles are swelling -Hold for the next 3 days and resume on 09/13/2017 What changed:  additional instructions   metFORMIN 500 MG 24 hr tablet Commonly known as:  GLUCOPHAGE-XR Take 1 tablet (500 mg total) daily with breakfast by mouth.   metoprolol succinate 100 MG 24 hr tablet Commonly known as:  TOPROL-XL TAKE 1 TABLET BY MOUTH DAILY FOR HIGH BLOOD PRESSURE   minoxidil 2.5 MG tablet Commonly known as:  LONITEN Take 2.5 mg by mouth 2 (two) times daily.   glucose blood test strip USE AS INSTRUCTED (TESTS 2 TIMES A DAY)   ONETOUCH VERIO test strip Generic drug:  glucose blood USE AS INSTRUCTED (TESTS 2 TIMES A DAY)   pantoprazole 40 MG tablet Commonly known as:  PROTONIX Take 1 tablet (40 mg total) by mouth daily.   repaglinide 2 MG tablet Commonly known as:  PRANDIN Take 0.5 tablets (1 mg total) by mouth 3 (three) times daily before meals.   telmisartan-hydrochlorothiazide 80-25 MG tablet Commonly known as:  MICARDIS HCT Take 1 tablet by mouth daily. Hold for the next 3 days then resume on 09/13/2017 What changed:  additional instructions         Diet and Activity recommendation: See Discharge Instructions above   Consults obtained -  None   Major procedures and Radiology Reports - PLEASE review detailed and final reports for all details, in brief -  Ct Abdomen Pelvis Wo  Contrast  Result Date: 08/14/2017 CLINICAL DATA:  Abdominal distension, constipation. Chronic kidney disease. EXAM: CT ABDOMEN AND PELVIS WITHOUT CONTRAST TECHNIQUE: Multidetector CT imaging of the abdomen and pelvis was performed following the standard protocol without IV contrast. COMPARISON:  04/06/2010 FINDINGS: Lower chest: Coronary calcifications. Hepatobiliary: No focal liver abnormality is seen. No gallstones, gallbladder wall thickening, or biliary dilatation. Pancreas: Unremarkable. No pancreatic ductal dilatation or surrounding inflammatory changes. Spleen: Normal in size without focal abnormality. Adrenals/Urinary Tract: 2 mm calcification in the upper pole right renal collecting system. No hydronephrosis. No focal renal lesion. Normal adrenals. Urinary bladder incompletely distended. Stomach/Bowel: Stomach is physiologically distended. Small bowel nondilated. Normal appendix. There is a large amount of proximal colonic fecal material, decompressed distally. Vascular/Lymphatic: Moderate aortoiliac calcified plaque without aneurysm. No abdominal or pelvic adenopathy. Reproductive: Status post hysterectomy. No adnexal masses. Other: No ascites.  No free air. Musculoskeletal: Spondylitic changes throughout the visualized lower thoracic and lumbar spine. 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 Negative for fracture or worrisome bone lesion. IMPRESSION: 1. No acute findings. 2. Moderate proximal colonic fecal material, decompressed distally. 3. Right nephrolithiasis without hydronephrosis. 4. Coronary and aortoiliac  atherosclerosis (ICD10-170.0). Electronically Signed   By: Lucrezia Europe M.D.   On: 08/14/2017 21:29   Ct Head Wo Contrast  Result Date: 09/09/2017 CLINICAL DATA:  Patient with loss of balance. EXAM: CT HEAD WITHOUT CONTRAST TECHNIQUE: Contiguous axial images were obtained from the base of the skull through the vertex without intravenous contrast.  COMPARISON:  None. FINDINGS: Brain: Ventricles and sulci are prominent compatible with atrophy. Periventricular and subcortical white matter hypodensity compatible with chronic microvascular ischemic changes. No evidence for acute cortically based infarct, intracranial hemorrhage, mass lesion or mass-effect. Vascular: Internal carotid arterial vascular calcifications. Skull: Intact. Sinuses/Orbits: Paranasal sinuses are unremarkable. Mastoid air cells are unremarkable. Orbits are unremarkable. Other: None. IMPRESSION: No acute intracranial process. Electronically Signed   By: Lovey Newcomer M.D.   On: 09/09/2017 02:39   Mr Brain Wo Contrast  Result Date: 09/09/2017 CLINICAL DATA:  Ataxia, stroke suspected EXAM: MRI HEAD WITHOUT CONTRAST TECHNIQUE: Multiplanar, multiecho pulse sequences of the brain and surrounding structures were obtained without intravenous contrast. COMPARISON:  CT head 09/09/2017 FINDINGS: Brain: Negative for acute infarct. Scattered small white matter infarct. Chronic lacunar infarction in the right pons. Chronic infarcts in the posterior limb internal capsule and lateral thalamus on the left. Negative for hemorrhage or mass lesion. No midline shift. Vascular: Normal arterial flow voids Skull and upper cervical spine: Negative Sinuses/Orbits: Mild mucosal edema paranasal sinuses.  Normal orbit Other: None IMPRESSION: Negative for acute infarct.  Chronic ischemic changes as above. Electronically Signed   By: Franchot Gallo M.D.   On: 09/09/2017 08:53    Micro Results     No results found for this or any previous visit (from the past 240 hour(s)).     Today   Subjective:   [name] today has no headache,no chest or abdominal pain, she denies any new focal deficits.   Objective:   Blood pressure 126/64, pulse 67, temperature 98.4 F (36.9 C), temperature source Oral, resp. rate (!) 24, height 5\' 2"  (1.575 m), weight 63 kg, SpO2 98 %.   Intake/Output Summary (Last 24  hours) at 09/09/2017 1558 Last data filed at 09/09/2017 1500 Gross per 24 hour  Intake 300 ml  Output -  Net 300 ml    Exam Awake Alert, Oriented x 3, No  new F.N deficits, Normal affect Symmetrical Chest wall movement, Good air movement bilaterally, CTAB RRR,No Gallops,Rubs or new Murmurs, No Parasternal Heave +ve B.Sounds, Abd Soft, Non tender,No rebound -guarding or rigidity. No Cyanosis, Clubbing or edema, No new Rash or bruise, patient was ambulated in the room, no focal deficits, negative Romberg's sign  Data Review   CBC w Diff:  Lab Results  Component Value Date   WBC 4.5 09/09/2017   HGB 8.1 (L) 09/09/2017   HCT 24.2 (L) 09/09/2017   PLT 153 09/09/2017   LYMPHOPCT 44 09/09/2017   MONOPCT 14 09/09/2017   EOSPCT 1 09/09/2017   BASOPCT 0 09/09/2017    CMP:  Lab Results  Component Value Date   NA 134 (L) 09/09/2017   K 4.4 09/09/2017   CL 101 09/09/2017   CO2 23 09/09/2017   BUN 59 (H) 09/09/2017   CREATININE 2.25 (H) 09/09/2017   PROT 6.8 09/09/2017   ALBUMIN 3.0 (L) 09/09/2017   BILITOT 1.2 09/09/2017   ALKPHOS 67 09/09/2017   AST 25 09/09/2017   ALT 22 09/09/2017  .   Total Time in preparing paper work, data evaluation and todays exam - 19 minutes  Phillips Climes M.D on 09/09/2017 at 3:58 PM  [facility]   Office  [phone]

## 2017-09-09 NOTE — Progress Notes (Signed)
Patient was discharged home with home health  by MD order; discharged instructions  review and give to patient with care notes and prescriptions; IV DIC; skin intact; patient will be escorted to the car by nurse tech via wheelchair.  

## 2017-09-09 NOTE — Evaluation (Signed)
Physical Therapy Evaluation Patient Details Name: [name] MRN: [id number] DOB: Feb 01, 1950 Today's Date: 09/09/2017   History of Present Illness  Pt is a 67 y/o female admitted secondary to ataxia and abnormal lab values. PMH including but not limited to DM, HTN and CKD.    Clinical Impression  Pt presented supine in bed with HOB elevated, awake and willing to participate in therapy session. Prior to admission, pt reported that she ambulated with use of single point cane and independent with ADLs. Pt lives alone but has a sister that can provide 24/7 supervision/assistance if needed. Pt currently able to perform bed mobility at modified independence, transfers with min guard and ambulated in hallway with min guard while pushing her IV pole. Pt with mild instability during ambulation but no true ataxia or incoordination noted. Pt would continue to benefit from skilled physical therapy services at this time while admitted and after d/c to address the below listed limitations in order to improve overall safety and independence with functional mobility.     Follow Up Recommendations Home health PT    Equipment Recommendations  None recommended by PT    Recommendations for Other Services       Precautions / Restrictions Precautions Precautions: Fall Restrictions Weight Bearing Restrictions: No      Mobility  Bed Mobility Overal bed mobility: Modified Independent                Transfers Overall transfer level: Needs assistance Equipment used: None Transfers: Sit to/from Stand Sit to Stand: Min guard         General transfer comment: min guard for safety, no AD; x1 from EOB, x1 from toilet  Ambulation/Gait Ambulation/Gait assistance: Min guard Gait Distance (Feet): 75 Feet Assistive device: IV Pole Gait Pattern/deviations: Step-through pattern;Decreased stride length Gait velocity: decreased Gait velocity interpretation: <1.31 ft/sec, indicative of household  ambulator General Gait Details: pt with mild instability but no overt LOB or need for physical assistance, min guard for safety; pt with preference for pushing the IV pole  Stairs            Wheelchair Mobility    Modified Rankin (Stroke Patients Only)       Balance Overall balance assessment: Needs assistance Sitting-balance support: Feet supported Sitting balance-Leahy Scale: Good     Standing balance support: During functional activity;No upper extremity supported Standing balance-Leahy Scale: Fair                               Pertinent Vitals/Pain Pain Assessment: No/denies pain    Home Living Family/patient expects to be discharged to:: Private residence Living Arrangements: Alone Available Help at Discharge: Family;Available 24 hours/day   Home Access: Level entry     Home Layout: One level Home Equipment: Cane - single point      Prior Function Level of Independence: Independent with assistive device(s)         Comments: ambulates with use of single point cane      Hand Dominance        Extremity/Trunk Assessment   Upper Extremity Assessment Upper Extremity Assessment: Overall WFL for tasks assessed    Lower Extremity Assessment Lower Extremity Assessment: Overall WFL for tasks assessed       Communication   Communication: No difficulties  Cognition Arousal/Alertness: Awake/alert Behavior During Therapy: WFL for tasks assessed/performed Overall Cognitive Status: Within Functional Limits for tasks assessed  General Comments      Exercises     Assessment/Plan    PT Assessment Patient needs continued PT services  PT Problem List Decreased mobility;Decreased balance;Decreased coordination;Decreased knowledge of use of DME;Decreased safety awareness;Decreased knowledge of precautions       PT Treatment Interventions DME instruction;Gait training;Stair  training;Functional mobility training;Therapeutic activities;Therapeutic exercise;Balance training;Neuromuscular re-education;Patient/family education    PT Goals (Current goals can be found in the Care Plan section)  Acute Rehab PT Goals Patient Stated Goal: to get back to walking PT Goal Formulation: With patient Time For Goal Achievement: 09/23/17 Potential to Achieve Goals: Good    Frequency Min 3X/week   Barriers to discharge        Co-evaluation               AM-PAC PT "6 Clicks" Daily Activity  Outcome Measure Difficulty turning over in bed (including adjusting bedclothes, sheets and blankets)?: None Difficulty moving from lying on back to sitting on the side of the bed? : None Difficulty sitting down on and standing up from a chair with arms (e.g., wheelchair, bedside commode, etc,.)?: A Little Help needed moving to and from a bed to chair (including a wheelchair)?: None Help needed walking in hospital room?: A Little Help needed climbing 3-5 steps with a railing? : A Little 6 Click Score: 21    End of Session   Activity Tolerance: Patient tolerated treatment well Patient left: in bed;with call bell/phone within reach;with family/visitor present;with bed alarm set Nurse Communication: Mobility status PT Visit Diagnosis: Other abnormalities of gait and mobility (R26.89)    Time: 1250-1301 PT Time Calculation (min) (ACUTE ONLY): 11 min   Charges:   PT Evaluation $PT Eval Low Complexity: [place], PT, DPT [place] 09/09/2017, 4:02 PM

## 2017-09-09 NOTE — Progress Notes (Addendum)
PT Cancellation Note  Patient Details Name: [name] MRN: [id number] DOB: 12/02/1950   Cancelled Treatment:    Reason Eval/Treat Not Completed: Patient at procedure or test/unavailable. PT will continue to follow up with pt acutely and await MRI results. CT was negative for any acute findings.   [place] 09/09/2017, 8:41 AM

## 2017-09-09 NOTE — ED Provider Notes (Addendum)
[facility] EMERGENCY DEPARTMENT Provider Note   CSN: [id number] Arrival date & time: 09/08/17  1906     History   Chief Complaint Chief Complaint  Patient presents with  . Abnormal Blood Results    PCP visit    HPI [name] is a 67 y.o. female with a hx of chronic back pain, GERD, HTN, SOB, CVA, non-insulin-dependent diabetes presents to the Emergency Department complaining of gradual, persistent, progressively worsening bilateral leg cramping onset intermittently for several weeks.  She reports this week she felt unbalanced with increasing pain in her legs.  She reports this has been going on for several days.  She denies falling or hitting her head in the last few days, but did fall 3 weeks ago.  She did not hit her head or have an LOC.  She reports due to the symptoms, she was seen by her primary care doctor yesterday.  She states lab work was taken and she was called today and instructed to come to the emergency department for admission.  Review shows that patient has a significantly elevated vitamin D level at greater than 120 and elevated serum creatinine from baseline.   The history is provided by the patient and medical records. No language interpreter was used.    Past Medical History:  Diagnosis Date  . Chronic back pain   . Cough   . Diabetes mellitus, type 2 ([place])   . Fibroid    patient thinks this was the reason for her hysterectomy  . GERD (gastroesophageal reflux disease)   . History of sebaceous cyst   . Hyperlipemia   . Hypertension   . Hyperthyroidism   . Lumbar radiculopathy   . Shortness of breath 09/13/2013  . Stroke ([place])   . Tobacco abuse     Patient Active Problem List   Diagnosis Date Noted  . Vitamin D toxicity, accidental or unintentional, initial encounter 09/09/2017  . Ataxia 09/09/2017  . Type 2 diabetes mellitus with hyperglycemia ([place]) 09/09/2017  . Olecranon bursitis of right elbow 03/09/2017  . Thyroid  nodule 06/29/2016  . Type 2 diabetes mellitus ([place]) 06/13/2016  . Vitamin D deficiency 01/12/2016  . Medicare annual wellness visit, initial 01/11/2016  . Lacunar infarct, acute ([place]) 03/23/2015  . Non compliance w medication regimen 01/16/2015  . Anemia 01/08/2015  . Acute kidney injury ([place]) 01/08/2015  . Stroke ([place]) 01/08/2015  . Hyperlipemia   . Carotid bruit present 12/13/2012  . Routine health maintenance 10/24/2010  . PERIPHERAL EDEMA 09/10/2009  . ELECTROCARDIOGRAM, ABNORMAL 04/11/2008  . HEART MURMUR, SYSTOLIC 07/24/1599  . SEBACEOUS CYST, NECK 12/13/2006  . Hyperthyroidism 09/27/2006  . Secondary diabetes with peripheral vascular disease ([place]) 09/27/2006  . Hyperlipidemia LDL goal <70 09/27/2006  . TOBACCO ABUSE 09/27/2006  . Essential hypertension 09/27/2006    Past Surgical History:  Procedure Laterality Date  . ABDOMINAL HYSTERECTOMY     --?ovaries remain  . RETINOPATHY SURGERY  2013   Dr. Zigmund Daniel     OB History    Gravida  1   Para  1   Term      Preterm      AB      Living  1     SAB      TAB      Ectopic      Multiple      Live Births               Home Medications  Prior to Admission medications   Medication Sig Start Date End Date Taking? Authorizing Provider  amLODipine (NORVASC) 10 MG tablet Take 1 tablet (10 mg total) by mouth daily. 03/26/15  Yes Golden Circle, FNP  aspirin 325 MG tablet Take 325 mg by mouth daily.   Yes [provider]  atorvastatin (LIPITOR) 80 MG tablet TAKE 1 TABLET DAILY. FOLLOW-UP APPT IS DUE NEED LIPIDS CHECK MUST SEE PROVIDER FOR FUTURE REFILLS 08/23/17  Yes Lance Sell, NP  calcitRIOL (ROCALTROL) 0.25 MCG capsule 1 (ONE) CAPSULE THREE TIMES A WEEK 05/17/17  Yes [provider]  cloNIDine (CATAPRES) 0.2 MG tablet Take 1 tablet (0.2 mg total) by mouth 3 (three) times daily. 08/04/17  Yes Lance Sell, NP  furosemide (LASIX) 40 MG tablet Take 40 mg by mouth See  admin instructions. Take 1 tablet daily, can take another pill if ankles are swelling   Yes [provider]  glucose blood (ONETOUCH VERIO) test strip USE AS INSTRUCTED (TESTS 2 TIMES A DAY) 03/22/17  Yes Lance Sell, NP  metFORMIN (GLUCOPHAGE-XR) 500 MG 24 hr tablet Take 1 tablet (500 mg total) daily with breakfast by mouth. 12/05/16  Yes Renato Shin, MD  metoprolol succinate (TOPROL-XL) 100 MG 24 hr tablet TAKE 1 TABLET BY MOUTH DAILY FOR HIGH BLOOD PRESSURE 03/02/17  Yes Lance Sell, NP  minoxidil (LONITEN) 2.5 MG tablet Take 2.5 mg by mouth 2 (two) times daily.   Yes [provider]  [facility] VERIO test strip USE AS INSTRUCTED (TESTS 2 TIMES A DAY) 03/23/17  Yes Shambley, Delphia Grates, NP  repaglinide (PRANDIN) 2 MG tablet Take 0.5 tablets (1 mg total) by mouth 3 (three) times daily before meals. 03/07/17  Yes Renato Shin, MD  telmisartan-hydrochlorothiazide (MICARDIS HCT) 80-25 MG tablet TAKE 1 TABLET BY MOUTH EVERY DAY 07/13/17  Yes Lance Sell, NP  Vitamin D, Ergocalciferol, (DRISDOL) 50000 units CAPS capsule 1 (ONE) CAPSULE TWICE A WEEK 02/25/17  Yes [provider]    Family History Family History  Problem Relation Age of Onset  . Hypertension Mother   . Sleep apnea Mother   . Diabetes Mother   . Hyperlipidemia Mother   . Cancer Father        lung cancer - smoker  . Hypertension Sister   . Diabetes Sister   . Hypertension Brother   . Hypertension Sister   . Diabetes Brother   . Hypertension Brother   . Breast cancer Neg Hx     Social History Social History   Tobacco Use  . Smoking status: Former Smoker    Packs/day: 0.50    Years: 35.00    Pack years: 17.50    Types: Cigarettes    Last attempt to quit: 07/07/2014    Years since quitting: 3.1  . Smokeless tobacco: Never Used  . Tobacco comment: patient advised to keep diary, develop strategy and stop  Substance Use Topics  . Alcohol use: No  . Drug use: No      Allergies   Patient has no known allergies.   Review of Systems Review of Systems  Constitutional: Positive for fatigue. Negative for appetite change, diaphoresis, fever and unexpected weight change.  HENT: Negative for mouth sores.   Eyes: Negative for visual disturbance.  Respiratory: Negative for cough, chest tightness, shortness of breath and wheezing.   Cardiovascular: Negative for chest pain.  Gastrointestinal: Negative for abdominal pain, constipation, diarrhea, nausea and vomiting.  Endocrine: Negative for polydipsia, polyphagia  and polyuria.  Genitourinary: Negative for dysuria, frequency, hematuria and urgency.  Musculoskeletal: Positive for arthralgias and myalgias. Negative for back pain and neck stiffness.  Skin: Negative for rash.  Allergic/Immunologic: Negative for immunocompromised state.  Neurological: Positive for weakness. Negative for syncope, light-headedness and headaches.  Hematological: Does not bruise/bleed easily.  Psychiatric/Behavioral: Negative for sleep disturbance. The patient is not nervous/anxious.      Physical Exam Updated Vital Signs BP (!) 141/55 (BP Location: Right Arm)   Pulse 63   Temp 99.3 F (37.4 C) (Oral)   Resp 18   Ht 5\' 2"  (1.575 m)   Wt 63 kg   SpO2 99%   BMI 25.42 kg/m   Physical Exam  Constitutional: She appears well-developed and well-nourished. No distress.  Awake, alert, nontoxic appearance  HENT:  Head: Normocephalic and atraumatic.  Mouth/Throat: Oropharynx is clear and moist. No oropharyngeal exudate.  Eyes: Conjunctivae are normal. No scleral icterus.  Neck: Normal range of motion. Neck supple.  Cardiovascular: Normal rate, regular rhythm and intact distal pulses.  Pulmonary/Chest: Effort normal and breath sounds normal. No respiratory distress. She has no wheezes.  Equal chest expansion  Abdominal: Soft. Bowel sounds are normal. She exhibits no mass. There is no tenderness. There is no rebound and no  guarding.  Musculoskeletal: Normal range of motion. She exhibits no edema.  No calf tenderness  Neurological: She is alert.  Speech is clear and goal oriented Moves extremities without ataxia Sensation intact to normal touch in the bilateral lower extremities Strength 5/5 in the bilateral lower extremities  Skin: Skin is warm and dry. She is not diaphoretic.  Psychiatric: She has a normal mood and affect.  Nursing note and vitals reviewed.    ED Treatments / Results  Labs (all labs ordered are listed, but only abnormal results are displayed) Labs Reviewed  CBC WITH DIFFERENTIAL/PLATELET - Abnormal; Notable for the following components:      Result Value   RBC 2.56 (*)    Hemoglobin 8.1 (*)    HCT 24.6 (*)    Platelets 137 (*)    Neutro Abs 1.4 (*)    All other components within normal limits  COMPREHENSIVE METABOLIC PANEL - Abnormal; Notable for the following components:   Sodium 130 (*)    Chloride 97 (*)    Glucose, Bld 314 (*)    BUN 66 (*)    Creatinine, Ser 2.70 (*)    Calcium 8.4 (*)    Albumin 3.2 (*)    Total Bilirubin 1.3 (*)    GFR calc non Af Amer 17 (*)    GFR calc Af Amer 20 (*)    All other components within normal limits  URINALYSIS, ROUTINE W REFLEX MICROSCOPIC - Abnormal; Notable for the following components:   Protein, ur 30 (*)    All other components within normal limits  HIV ANTIBODY (ROUTINE TESTING)  CBC  HEPATIC FUNCTION PANEL  BASIC METABOLIC PANEL  CBC WITH DIFFERENTIAL/PLATELET  MAGNESIUM  PHOSPHORUS  TSH  VITAMIN B12  FOLATE RBC    EKG EKG Interpretation  Date/Time:  Saturday September 09 2017 03:13:41 EDT Ventricular Rate:  74 PR Interval:    QRS Duration: 90 QT Interval:  441 QTC Calculation: 490 R Axis:   -31 Text Interpretation:  Sinus rhythm Probable left atrial enlargement Left ventricular hypertrophy Anterior Q waves, possibly due to LVH Nonspecific T abnormalities, lateral leads No significant change since last tracing  Confirmed by Orpah Greek [phone]) on 09/09/2017 4:15:30  AM    Procedures Procedures (including critical care time)  Medications Ordered in ED Medications  aspirin tablet 325 mg (has no administration in time range)  amLODipine (NORVASC) tablet 10 mg (has no administration in time range)  atorvastatin (LIPITOR) tablet 80 mg (has no administration in time range)  cloNIDine (CATAPRES) tablet 0.2 mg (has no administration in time range)  furosemide (LASIX) tablet 40 mg (has no administration in time range)  metoprolol succinate (TOPROL-XL) 24 hr tablet 100 mg (has no administration in time range)  minoxidil (LONITEN) tablet 2.5 mg (has no administration in time range)  acetaminophen (TYLENOL) tablet 650 mg (has no administration in time range)    Or  acetaminophen (TYLENOL) suppository 650 mg (has no administration in time range)  ondansetron (ZOFRAN) tablet 4 mg (has no administration in time range)    Or  ondansetron (ZOFRAN) injection 4 mg (has no administration in time range)  insulin aspart (novoLOG) injection 0-9 Units (has no administration in time range)  heparin injection 5,000 Units (has no administration in time range)  sodium chloride 0.9 % bolus 500 mL (0 mLs Intravenous Stopped 09/09/17 0346)     Initial Impression / Assessment and Plan / ED Course  I have reviewed the triage vital signs and the nursing notes.  Pertinent labs & imaging results that were available during my care of the patient were reviewed by me and considered in my medical decision making (see chart for details).  Clinical Course as of Sep 09 728  Sat Sep 09, 2017  0205 Discussed with Dr. Hal Hope who will admit   [HM]    Clinical Course User Index [HM] Derran Sear, Gwenlyn Perking    Presents with lab abnormalities.  Abdomen D tested yesterday with level greater than 120.  Suspect this is secondary to her vitamin D supplementation and a worsening creatinine clearance.  Creatinine  clearance today 2.7 up from 2.0, approx 2 months ago.  Patient is afebrile without hypotension.  No evidence of sepsis.  She does take Lasix on a regular basis and reports compliance with this medication.  This may be the cause of her worsening serum creatinine.  We will give gentle fluid hydration here in the emergency department.  Patient's potassium is 4.5 and her calcium is 8.4.  No evidence of hypercalcemia.  Patient does have hyponatremia and hypochloremia.   She will need admission for gentle hydration and repeat labs.   Final Clinical Impressions(s) / ED Diagnoses   Final diagnoses:  Vitamin D toxicity, accidental or unintentional, initial encounter  Acute renal failure superimposed on chronic kidney disease, unspecified CKD stage, unspecified acute renal failure type [facility])  Hyponatremia  Weakness    ED Discharge Orders    None         Agapito Games 09/09/17 0730    Orpah Greek, MD 09/09/17 [phone]

## 2017-09-09 NOTE — Care Management Note (Signed)
Case Management Note  Patient Details  Name: [name] MRN: [id number] Date of Birth: 1950/12/21  Subjective/Objective:     Pt presented from home with family for ataxia.  Pt agreeable to [facility] PT and has used [facility] in the past.  Pt uses cane at home and does not feel she needs other DME.               Action/Plan: Pt wishes to use [facility] again.  Referral called to [facility] with [facility].  Expected Discharge Date:  09/09/17               Expected Discharge Plan:  [place]  In-House Referral:  NA  Discharge planning Services  CM Consult  Post Acute Care Choice:  Home Health Choice offered to:  Patient  DME Arranged:    DME Agency:  NA  HH Arranged:  PT [place] Agency:  [place]  Status of Service:  Completed, signed off  If discussed at [place] of Stay Meetings, dates discussed:    Additional Comments:  Claudie Leach, RN 09/09/2017, 4:22 PM

## 2017-09-10 NOTE — Telephone Encounter (Signed)
Response sent in lab note

## 2017-09-11 ENCOUNTER — Telehealth: Payer: Self-pay | Admitting: *Deleted

## 2017-09-11 LAB — FOLATE RBC
FOLATE, HEMOLYSATE: 317.6 ng/mL
Folate, RBC: 1265 ng/mL (ref >498)
HEMATOCRIT: 25.1 % — AB (ref 34.0–46.6)

## 2017-09-11 NOTE — Telephone Encounter (Signed)
Called pt to verify hosp f/u appt that has been schedule for 09/13/17, which pt was aware. Inform need to ask additional questions concerning discharge. Completed TCM below.[name]  Transition Care Management Follow-up Telephone Call   Date discharged? 09/09/17   How have you been since you were released from the hospital? Pt states she is doing ok   Do you understand why you were in the hospital? YES   Do you understand the discharge instructions? YES   Where were you discharged to? Home   Items Reviewed:  Medications reviewed: YES  Allergies reviewed: YES  Dietary changes reviewed: YES  Referrals reviewed: No referral needed   Functional Questionnaire:   Activities of Daily Living (ADLs):   She states she are independent in the following: ambulation, bathing and hygiene, feeding, continence, grooming, toileting and dressing States she doesn't require assistance    Any transportation issues/concerns?: NO   Any patient concerns? NO   Confirmed importance and date/time of follow-up visits scheduled YES, appt 09/13/17  Provider Appointment booked with Caesar Chestnut, NP  Confirmed with patient if condition begins to worsen call PCP or go to the ER.  Patient was given the office number and encouraged to call back with question or concerns.  : YES

## 2017-09-12 LAB — ANA: ANA: NEGATIVE

## 2017-09-13 ENCOUNTER — Encounter: Payer: Self-pay | Admitting: Nurse Practitioner

## 2017-09-13 ENCOUNTER — Ambulatory Visit (INDEPENDENT_AMBULATORY_CARE_PROVIDER_SITE_OTHER): Payer: Medicare Other | Admitting: Nurse Practitioner

## 2017-09-13 ENCOUNTER — Other Ambulatory Visit (INDEPENDENT_AMBULATORY_CARE_PROVIDER_SITE_OTHER): Payer: Medicare Other

## 2017-09-13 VITALS — BP 148/62 | HR 67 | Ht 62.0 in | Wt 139.0 lb

## 2017-09-13 DIAGNOSIS — I10 Essential (primary) hypertension: Secondary | ICD-10-CM | POA: Diagnosis not present

## 2017-09-13 DIAGNOSIS — M79605 Pain in left leg: Secondary | ICD-10-CM | POA: Diagnosis not present

## 2017-09-13 DIAGNOSIS — T452X1A Poisoning by vitamins, accidental (unintentional), initial encounter: Secondary | ICD-10-CM | POA: Diagnosis not present

## 2017-09-13 DIAGNOSIS — M79604 Pain in right leg: Secondary | ICD-10-CM | POA: Diagnosis not present

## 2017-09-13 DIAGNOSIS — N184 Chronic kidney disease, stage 4 (severe): Secondary | ICD-10-CM

## 2017-09-13 LAB — BASIC METABOLIC PANEL
BUN: 50 mg/dL — AB (ref 6–23)
CHLORIDE: 99 meq/L (ref 96–112)
CO2: 25 mEq/L (ref 19–32)
Calcium: 9.3 mg/dL (ref 8.4–10.5)
Creatinine, Ser: 2.03 mg/dL — ABNORMAL HIGH (ref 0.40–1.20)
GFR: 31.36 mL/min — AB (ref >60.00)
Glucose, Bld: 192 mg/dL — ABNORMAL HIGH (ref 70–99)
Potassium: 5 mEq/L (ref 3.5–5.1)
SODIUM: 131 meq/L — AB (ref 135–145)

## 2017-09-13 LAB — CBC
HCT: 28.3 % — ABNORMAL LOW (ref 36.0–46.0)
Hemoglobin: 9.6 g/dL — ABNORMAL LOW (ref 12.0–15.0)
MCHC: 33.8 g/dL (ref 30.0–36.0)
MCV: 91.6 fl (ref 78.0–100.0)
PLATELETS: 182 10*3/uL (ref 150.0–400.0)
RBC: 3.09 Mil/uL — AB (ref 3.87–5.11)
RDW: 13.5 % (ref 11.5–15.5)
WBC: 5.7 10*3/uL (ref 4.0–10.5)

## 2017-09-13 NOTE — Assessment & Plan Note (Signed)
BP slightly elevated currently holding micardis No medication changes today - CBC; Future - Basic metabolic panel; Future

## 2017-09-13 NOTE — Progress Notes (Signed)
Name: [name]   MRN: [id number]    DOB: 04/10/1950   Date:09/13/2017       Progress Note  Subjective  Chief [facility] follow up  HPI  [name] is here today for hospital follow up, sent from our office on 8/16 for vitamin D toxicity and worsening kidney function noted on 8/16 labwork. She was admitted into the hospital for ataxia, vitamin D toxicity and acute on chronic renal failure. During her hospital stay, She had an MRI with showed no acute findings. She was given IVF overnight and her renal function improved to baseline. She was discharged home  on 09/09/17 with the following  Recommendations for primary care physician for things to follow:  -Please check CBC, BMP during next visit to ensure stable renal function She was also instructed to hold her calcitrol and vitamin D on discharge home, as well as holding her lasix, telmisartan- HCTZ for x 3 days. She has stopped her calcitrol and vitamin D, resumed her lasix today, does not think she ever stopped taking her telmisartn-HCTZ because she was confused about her discharge instructions. She says she is overall feeling okay today, and tells me that her leg pain and tingling have actually improved. She does request referral to a new kidney doctor, she does not want to return to her prior neprhologist   Patient Active Problem List   Diagnosis Date Noted  . Vitamin D toxicity, accidental or unintentional, initial encounter 09/09/2017  . Ataxia 09/09/2017  . Type 2 diabetes mellitus with hyperglycemia ([place]) 09/09/2017  . Acute renal failure superimposed on chronic kidney disease ([place])   . Olecranon bursitis of right elbow 03/09/2017  . Thyroid nodule 06/29/2016  . Type 2 diabetes mellitus ([place]) 06/13/2016  . Vitamin D deficiency 01/12/2016  . Medicare annual wellness visit, initial 01/11/2016  . Lacunar infarct, acute ([place]) 03/23/2015  . Non compliance w medication regimen 01/16/2015  . Anemia 01/08/2015  . Acute  kidney injury ([place]) 01/08/2015  . Stroke ([place]) 01/08/2015  . Hyperlipemia   . Carotid bruit present 12/13/2012  . Routine health maintenance 10/24/2010  . PERIPHERAL EDEMA 09/10/2009  . ELECTROCARDIOGRAM, ABNORMAL 04/11/2008  . HEART MURMUR, SYSTOLIC 66/59/9357  . SEBACEOUS CYST, NECK 12/13/2006  . Hyperthyroidism 09/27/2006  . Secondary diabetes with peripheral vascular disease ([place]) 09/27/2006  . Hyperlipidemia LDL goal <70 09/27/2006  . TOBACCO ABUSE 09/27/2006  . Essential hypertension 09/27/2006    Past Surgical History:  Procedure Laterality Date  . ABDOMINAL HYSTERECTOMY     --?ovaries remain  . RETINOPATHY SURGERY  2013   Dr. Zigmund Daniel    Family History  Problem Relation Age of Onset  . Hypertension Mother   . Sleep apnea Mother   . Diabetes Mother   . Hyperlipidemia Mother   . Cancer Father        lung cancer - smoker  . Hypertension Sister   . Diabetes Sister   . Hypertension Brother   . Hypertension Sister   . Diabetes Brother   . Hypertension Brother   . Breast cancer Neg Hx     Social History   Socioeconomic History  . Marital status: Divorced    Spouse name: Not on file  . Number of children: 1  . Years of education: 8  . Highest education level: Not on file  Occupational History  . Occupation: Insurance account manager: [employer]  Social Needs  . Financial resource strain: Not hard at all  .  Food insecurity:    Worry: Never true    Inability: Never true  . Transportation needs:    Medical: No    Non-medical: No  Tobacco Use  . Smoking status: Former Smoker    Packs/day: 0.50    Years: 35.00    Pack years: 17.50    Types: Cigarettes    Last attempt to quit: 07/07/2014    Years since quitting: 3.1  . Smokeless tobacco: Never Used  . Tobacco comment: patient advised to keep diary, develop strategy and stop  Substance and Sexual Activity  . Alcohol use: No  . Drug use: No  . Sexual activity: Never    Partners: Male     Birth control/protection: Surgical    Comment: TAH--?ovaries remain  Lifestyle  . Physical activity:    Days per week: 6 days    Minutes per session: 60 min  . Stress: Not at all  Relationships  . Social connections:    Talks on phone: More than three times a week    Gets together: More than three times a week    Attends religious service: More than 4 times per year    Active member of club or organization: Yes    Attends meetings of clubs or organizations: More than 4 times per year    Relationship status: Divorced  . Intimate partner violence:    Fear of current or ex partner: No    Emotionally abused: No    Physically abused: No    Forced sexual activity: No  Other Topics Concern  . Not on file  Social History Narrative   Denies abuse and feels safe at home.     Current Outpatient Medications:  .  amLODipine (NORVASC) 10 MG tablet, Take 1 tablet (10 mg total) by mouth daily., Disp: 90 tablet, Rfl: 3 .  aspirin 325 MG tablet, Take 325 mg by mouth daily., Disp: , Rfl:  .  atorvastatin (LIPITOR) 80 MG tablet, TAKE 1 TABLET DAILY. FOLLOW-UP APPT IS DUE NEED LIPIDS CHECK MUST SEE PROVIDER FOR FUTURE REFILLS, Disp: 90 tablet, Rfl: 1 .  cloNIDine (CATAPRES) 0.2 MG tablet, Take 1 tablet (0.2 mg total) by mouth 3 (three) times daily., Disp: 90 tablet, Rfl: 0 .  Cyanocobalamin (B-12) 100 MCG TABS, Take 1 tablet by mouth at bedtime., Disp: 30 tablet, Rfl: 0 .  furosemide (LASIX) 40 MG tablet, Take 1 tablet (40 mg total) by mouth See admin instructions. Take 1 tablet daily, can take another pill if ankles are swelling -Hold for the next 3 days and resume on 09/13/2017, Disp: 30 tablet, Rfl:  .  glucose blood (ONETOUCH VERIO) test strip, USE AS INSTRUCTED (TESTS 2 TIMES A DAY), Disp: 100 each, Rfl: 5 .  metFORMIN (GLUCOPHAGE-XR) 500 MG 24 hr tablet, Take 1 tablet (500 mg total) daily with breakfast by mouth., Disp: 90 tablet, Rfl: 3 .  metoprolol succinate (TOPROL-XL) 100 MG 24 hr tablet,  TAKE 1 TABLET BY MOUTH DAILY FOR HIGH BLOOD PRESSURE, Disp: 90 tablet, Rfl: 3 .  minoxidil (LONITEN) 2.5 MG tablet, Take 2.5 mg by mouth 2 (two) times daily., Disp: , Rfl:  .  ONETOUCH VERIO test strip, USE AS INSTRUCTED (TESTS 2 TIMES A DAY), Disp: 100 each, Rfl: 5 .  pantoprazole (PROTONIX) 40 MG tablet, Take 1 tablet (40 mg total) by mouth daily., Disp: 30 tablet, Rfl: 0 .  repaglinide (PRANDIN) 2 MG tablet, Take 0.5 tablets (1 mg total) by mouth 3 (three) times daily before  meals., Disp: 50 tablet, Rfl: 11 .  telmisartan-hydrochlorothiazide (MICARDIS HCT) 80-25 MG tablet, Take 1 tablet by mouth daily. Hold for the next 3 days then resume on 09/13/2017, Disp: 90 tablet, Rfl: 1  No Known Allergies   ROS See HPI  Objective  Vitals:   09/13/17 1319  BP: (!) 148/62  Pulse: 67  SpO2: 98%  Weight: 139 lb (63 kg)  Height: 5\' 2"  (1.575 m)    Body mass index is 25.42 kg/m.  Physical Exam Vital signs reviewed. Constitutional: Patient appears well-developed and well-nourished. No distress.  HENT: Head: Normocephalic and atraumatic Nose: Nose normal. Mouth/Throat: Oropharynx is clear and moist. No oropharyngeal exudate.  Eyes: Conjunctivae and EOM are normal. Pupils are equal, round, and reactive to light. No scleral icterus.  Neck: Normal range of motion. Neck supple.   Cardiovascular: Normal rate, regular rhythm. Murmur heard.. No BLE edema. Distal pulses intact. Pulmonary/Chest: Effort normal and breath sounds normal. No respiratory distress. Musculoskeletal: Normal range of motion, No gross deformities Neurological: She is alert and oriented to person, place, and time. No cranial nerve deficit. Coordination, balance, strength, speech and gait are normal.  Skin: Skin is warm and dry. No rash noted. No erythema.  Psychiatric: Patient has a normal mood and affect. behavior is normal. Judgment and thought content normal.   Assessment & Plan RTC in 3 months for F/U- repeat Vitamin D  level  -Reviewed Health Maintenance: up to date  Pain in both lower extremities Resolved RTC for new, worsening or recurrent symptoms  Vitamin D toxicity, accidental or unintentional, initial encounter She has discontined calcitrol and vitamin D supplement as instructed Additional labs today RTC in 3 months for F/U- repeat Vitamin D level - PTH, Intact and Calcium; Future

## 2017-09-13 NOTE — Assessment & Plan Note (Addendum)
Update labs per hospital discharge recommendations F/U with further recommendations pending lab results I have placed referral to a new neprhologist at her request today - CBC; Future - Basic metabolic panel; Future - Ambulatory referral to Nephrology

## 2017-09-13 NOTE — Patient Instructions (Addendum)
Please head downstairs for lab work. If any of your test results are critically abnormal, you will be contacted right away. Otherwise, I will contact you within a week about your test results and follow up recommendations  Please return for follow up in about 3 months, so I can see how you are doing and we can recheck your vitamin D levels.

## 2017-09-15 LAB — PTH, INTACT AND CALCIUM
Calcium: 9.1 mg/dL (ref 8.6–10.4)
PTH: 113 pg/mL — ABNORMAL HIGH (ref 14–64)

## 2017-09-21 ENCOUNTER — Other Ambulatory Visit: Payer: Self-pay | Admitting: Nurse Practitioner

## 2017-09-21 DIAGNOSIS — E349 Endocrine disorder, unspecified: Secondary | ICD-10-CM

## 2017-09-21 NOTE — Progress Notes (Signed)
rr

## 2017-09-22 ENCOUNTER — Encounter: Payer: Self-pay | Admitting: Internal Medicine

## 2017-10-11 DIAGNOSIS — N183 Chronic kidney disease, stage 3 (moderate): Secondary | ICD-10-CM | POA: Diagnosis not present

## 2017-10-11 DIAGNOSIS — E1122 Type 2 diabetes mellitus with diabetic chronic kidney disease: Secondary | ICD-10-CM | POA: Diagnosis not present

## 2017-10-11 DIAGNOSIS — N2581 Secondary hyperparathyroidism of renal origin: Secondary | ICD-10-CM | POA: Diagnosis not present

## 2017-10-11 DIAGNOSIS — D631 Anemia in chronic kidney disease: Secondary | ICD-10-CM | POA: Diagnosis not present

## 2017-10-11 DIAGNOSIS — I129 Hypertensive chronic kidney disease with stage 1 through stage 4 chronic kidney disease, or unspecified chronic kidney disease: Secondary | ICD-10-CM | POA: Diagnosis not present

## 2017-10-12 ENCOUNTER — Other Ambulatory Visit: Payer: Self-pay | Admitting: Nurse Practitioner

## 2017-10-13 ENCOUNTER — Ambulatory Visit: Payer: Medicare Other | Admitting: Podiatry

## 2017-10-13 ENCOUNTER — Encounter: Payer: Self-pay | Admitting: Podiatry

## 2017-10-13 DIAGNOSIS — M79674 Pain in right toe(s): Secondary | ICD-10-CM

## 2017-10-13 DIAGNOSIS — M79675 Pain in left toe(s): Secondary | ICD-10-CM | POA: Diagnosis not present

## 2017-10-13 DIAGNOSIS — E1151 Type 2 diabetes mellitus with diabetic peripheral angiopathy without gangrene: Secondary | ICD-10-CM

## 2017-10-13 DIAGNOSIS — B351 Tinea unguium: Secondary | ICD-10-CM | POA: Diagnosis not present

## 2017-10-19 ENCOUNTER — Encounter: Payer: Self-pay | Admitting: Podiatry

## 2017-10-19 NOTE — Progress Notes (Signed)
Subjective: Elberta Fortis for preventative foot care and follow up of  Painful, elongated,  discolored, thick toenails which interfere with daily activities and routine tasks.  Pain is relieved with periodic professional debridement.  She voices no new pedal concerns on today's visit.  Pedal risk factors: NIDDM, CKD stage 4, history of smoking (quit 2016), h/o stroke, hyperlipidemia  Objective: Vascular Examination: Capillary refill time <3 seconds x 10 digits Dorsalis pedis are 1/4 b/l posterior tibial pulses nonpalpable No signs of ischemia/gangrene No digital hair x 10 digits Skin temperature warm to warm b/l  Dermatological Examination: Skin thin and atrophic b/l Toenails 1-5 b/l discolored, thick, dystrophic with subungual debris and pain with palpation to nailbeds due to thickness of nails.  Musculoskeletal: Muscle strength 5/5 to all LE muscle groups  Neurological: Sensation intact with 10 gram monofilament. Vibratory sensation intact.  Assessment: Painful onychomycosis toenails 1-5 b/l  NIDDM with PAD  Plan: 1. Continue diabetic foot care principles. 2. Toenails 1-5 b/l were debrided in length and girth without iatrogenic bleeding. 3. Patient to continue soft, supportive shoe gear 4. Patient to report any pedal injuries to medical professional immediately. 5. Follow up 3 months. Patient/POA to call should there be a concern in the interim.

## 2017-11-07 ENCOUNTER — Ambulatory Visit: Payer: Medicare Other | Admitting: Internal Medicine

## 2017-11-07 ENCOUNTER — Encounter (INDEPENDENT_AMBULATORY_CARE_PROVIDER_SITE_OTHER): Payer: Self-pay

## 2017-11-07 ENCOUNTER — Encounter: Payer: Self-pay | Admitting: Internal Medicine

## 2017-11-07 VITALS — BP 160/60 | HR 72 | Ht 62.0 in | Wt 144.4 lb

## 2017-11-07 DIAGNOSIS — D638 Anemia in other chronic diseases classified elsewhere: Secondary | ICD-10-CM

## 2017-11-07 DIAGNOSIS — K5909 Other constipation: Secondary | ICD-10-CM

## 2017-11-07 NOTE — Patient Instructions (Signed)
  Dr Carlean Purl recommends that you complete a bowel purge (to clean out your bowels). Please do the following: Purchase a bottle of Miralax over the counter as well as a box of 5 mg dulcolax tablets. Take 4 dulcolax tablets. Wait 1 hour. You will then drink 4 capfuls of Miralax mixed in an adequate amount of water/juice/gatorade (you may choose which of these liquids to drink) over the next 2-3 hours. You should expect results within 1 to 6 hours after completing the bowel purge.    After the purge try one dose of Miralax daily, if that doesn't work take 2 doses of Miralax daily.    Follow up with Dr Carlean Purl as needed.    I appreciate the opportunity to care for you. Silvano Rusk, MD, [facility]

## 2017-11-07 NOTE — Progress Notes (Signed)
[name] 67 y.o. Sep 23, 1950 [id number]  Assessment & Plan:   Encounter Diagnoses  Name Primary?  . Chronic constipation Yes  . Anemia of chronic disease     Patient is advised to use MiraLAX up to twice a day on a regular basis.  I think her symptoms are related to her constipation caused by medications.  If they fail to improve she can contact me or return for a visit.  I do not think she needs to have a change in the schedule of her planned colonoscopy for next year on a routine basis.   Subjective:   Chief Complaint:  HPI Patient is complaining of constipation and bloating.'s been going on for several years and it is associated with use of multiple blood pressure medication she has difficult to control hypertension.  If she uses MiraLAX it helps she is only taking it every other day because she does not want to be dependent on a laxative.  She does not have daily defecation with it every other day but it does improve things.  The bloating and distention that she describes are generalized and improved with defecation though she has a lack of complete defecation frequently.  There is no bleeding or narrowing of the stool or progressive caliber change.  GI review of systems is otherwise negative.  She had a colonoscopy with distal hyperplastic polyps and diverticulosis in August 2010 and is due for a repeat screening exam in 2020.  She has chronic renal disease and a chronic anemia which is stable with hemoglobin in the 8-9 range over the last several months.  He also has secondary hyperparathyroidism. No Known Allergies Current Meds  Medication Sig  . amLODipine (NORVASC) 10 MG tablet Take 1 tablet (10 mg total) by mouth daily.  [name] aspirin 325 MG tablet Take 325 mg by mouth daily.  [name] atorvastatin (LIPITOR) 80 MG tablet TAKE 1 TABLET DAILY. FOLLOW-UP APPT IS DUE NEED LIPIDS CHECK MUST SEE PROVIDER FOR FUTURE REFILLS  . cloNIDine (CATAPRES) 0.2 MG tablet Take 1 tablet (0.2 mg  total) by mouth 3 (three) times daily.  . furosemide (LASIX) 40 MG tablet Take 1 tablet (40 mg total) by mouth See admin instructions. Take 1 tablet daily, can take another pill if ankles are swelling -Hold for the next 3 days and resume on 09/13/2017  . glucose blood (ONETOUCH VERIO) test strip USE AS INSTRUCTED (TESTS 2 TIMES A DAY)  . metFORMIN (GLUCOPHAGE-XR) 500 MG 24 hr tablet Take 1 tablet (500 mg total) daily with breakfast by mouth.  . metoprolol succinate (TOPROL-XL) 100 MG 24 hr tablet TAKE 1 TABLET BY MOUTH DAILY FOR HIGH BLOOD PRESSURE  . minoxidil (LONITEN) 2.5 MG tablet Take 2.5 mg by mouth 2 (two) times daily.  [name] test strip USE AS INSTRUCTED (TESTS 2 TIMES A DAY)  . pantoprazole (PROTONIX) 40 MG tablet TAKE 1 TABLET BY MOUTH EVERY DAY  . repaglinide (PRANDIN) 2 MG tablet Take 0.5 tablets (1 mg total) by mouth 3 (three) times daily before meals.  [name] telmisartan-hydrochlorothiazide (MICARDIS HCT) 80-25 MG tablet Take 1 tablet by mouth daily. Hold for the next 3 days then resume on 09/13/2017  . vitamin B-12 (CYANOCOBALAMIN) 100 MCG tablet TAKE 1 TABLET BY MOUTH AT BEDTIME   Past Medical History:  Diagnosis Date  . Acute kidney injury ([place]) 01/08/2015  . Chronic back pain   . Cough   . Diabetes mellitus, type 2 ([place])   . Diverticulosis   .  Fibroid    patient thinks this was the reason for her hysterectomy  . GERD (gastroesophageal reflux disease)   . History of sebaceous cyst   . Hyperlipemia   . Hyperplastic colon polyp   . Hypertension   . Hyperthyroidism   . Lumbar radiculopathy   . Shortness of breath 09/13/2013  . Stroke ([place])   . Tobacco abuse    Past Surgical History:  Procedure Laterality Date  . ABDOMINAL HYSTERECTOMY     --?ovaries remain  . COLONOSCOPY W/ BIOPSIES    . RETINOPATHY SURGERY Bilateral 2013   Dr. Zigmund Daniel   Social History   Social History Narrative   Divorced 1 son    Retired Museum/gallery curator for [employer]   Former  smoker no alcohol caffeine or drug use report denies abuse and feels safe at home.   family history includes Diabetes in her brother, mother, and sister; Hyperlipidemia in her mother; Hypertension in her brother, brother, mother, sister, and sister; Lung cancer in her father; Sleep apnea in her mother.   Review of Systems See HPI.  All other review of systems appears negative at this time.  Objective:   Physical Exam BP (!) 160/60   Pulse 72   Ht 5\' 2"  (1.575 m)   Wt 144 lb 6.4 oz (65.5 kg)   BMI 26.41 kg/m  Well-developed well-nourished no acute distress black woman Eyes are anicteric The lungs are clear Normal heart sounds Abdomen is soft nontender without organomegaly or mass bowel sounds are present Is alert and oriented x3

## 2017-11-08 ENCOUNTER — Encounter: Payer: Self-pay | Admitting: Internal Medicine

## 2017-11-19 ENCOUNTER — Other Ambulatory Visit: Payer: Self-pay | Admitting: Endocrinology

## 2017-12-15 ENCOUNTER — Encounter: Payer: Self-pay | Admitting: Nurse Practitioner

## 2017-12-15 ENCOUNTER — Ambulatory Visit (INDEPENDENT_AMBULATORY_CARE_PROVIDER_SITE_OTHER): Payer: Medicare Other | Admitting: Nurse Practitioner

## 2017-12-15 VITALS — BP 150/78 | HR 59 | Ht 62.0 in | Wt 145.0 lb

## 2017-12-15 DIAGNOSIS — T452X1A Poisoning by vitamins, accidental (unintentional), initial encounter: Secondary | ICD-10-CM

## 2017-12-15 DIAGNOSIS — Z23 Encounter for immunization: Secondary | ICD-10-CM

## 2017-12-15 DIAGNOSIS — I10 Essential (primary) hypertension: Secondary | ICD-10-CM | POA: Diagnosis not present

## 2017-12-15 NOTE — Patient Instructions (Signed)
It was good to see you.  I will see you back in about 6 months!  Happy holidays!!!

## 2017-12-15 NOTE — Progress Notes (Signed)
[name] is a 67 y.o. female with the following history as recorded in EpicCare:  Patient Active Problem List   Diagnosis Date Noted  . Vitamin D toxicity, accidental or unintentional, initial encounter 09/09/2017  . Ataxia 09/09/2017  . Type 2 diabetes mellitus with hyperglycemia ([place]) 09/09/2017  . Chronic kidney disease (CKD)   . Olecranon bursitis of right elbow 03/09/2017  . Thyroid nodule 06/29/2016  . Type 2 diabetes mellitus ([place]) 06/13/2016  . Vitamin D deficiency 01/12/2016  . Medicare annual wellness visit, initial 01/11/2016  . Lacunar infarct, acute ([place]) 03/23/2015  . Non compliance w medication regimen 01/16/2015  . Anemia 01/08/2015  . Stroke ([place]) 01/08/2015  . Hyperlipemia   . Carotid bruit present 12/13/2012  . Routine health maintenance 10/24/2010  . PERIPHERAL EDEMA 09/10/2009  . ELECTROCARDIOGRAM, ABNORMAL 04/11/2008  . HEART MURMUR, SYSTOLIC 57/84/6962  . SEBACEOUS CYST, NECK 12/13/2006  . Hyperthyroidism 09/27/2006  . Secondary diabetes with peripheral vascular disease ([place]) 09/27/2006  . Hyperlipidemia LDL goal <70 09/27/2006  . TOBACCO ABUSE 09/27/2006  . Essential hypertension 09/27/2006    Current Outpatient Medications  Medication Sig Dispense Refill  . amLODipine (NORVASC) 10 MG tablet Take 1 tablet (10 mg total) by mouth daily. 90 tablet 3  . aspirin 325 MG tablet Take 325 mg by mouth daily.    [name] atorvastatin (LIPITOR) 80 MG tablet TAKE 1 TABLET DAILY. FOLLOW-UP APPT IS DUE NEED LIPIDS CHECK MUST SEE PROVIDER FOR FUTURE REFILLS 90 tablet 1  . cloNIDine (CATAPRES) 0.2 MG tablet Take 1 tablet (0.2 mg total) by mouth 3 (three) times daily. 90 tablet 0  . furosemide (LASIX) 40 MG tablet Take 1 tablet (40 mg total) by mouth See admin instructions. Take 1 tablet daily, can take another pill if ankles are swelling -Hold for the next 3 days and resume on 09/13/2017 30 tablet   . glucose blood (ONETOUCH VERIO) test strip USE AS INSTRUCTED (TESTS 2  TIMES A DAY) 100 each 5  . metFORMIN (GLUCOPHAGE-XR) 500 MG 24 hr tablet TAKE 1 TABLET (500 MG TOTAL) DAILY WITH BREAKFAST BY MOUTH. 90 tablet 3  . metoprolol succinate (TOPROL-XL) 100 MG 24 hr tablet TAKE 1 TABLET BY MOUTH DAILY FOR HIGH BLOOD PRESSURE 90 tablet 3  . minoxidil (LONITEN) 2.5 MG tablet Take 2.5 mg by mouth 2 (two) times daily.    [name] test strip USE AS INSTRUCTED (TESTS 2 TIMES A DAY) 100 each 5  . pantoprazole (PROTONIX) 40 MG tablet TAKE 1 TABLET BY MOUTH EVERY DAY 30 tablet 3  . repaglinide (PRANDIN) 2 MG tablet Take 0.5 tablets (1 mg total) by mouth 3 (three) times daily before meals. 50 tablet 11  . telmisartan-hydrochlorothiazide (MICARDIS HCT) 80-25 MG tablet Take 1 tablet by mouth daily. Hold for the next 3 days then resume on 09/13/2017 90 tablet 1  . vitamin B-12 (CYANOCOBALAMIN) 100 MCG tablet TAKE 1 TABLET BY MOUTH AT BEDTIME 30 tablet 3   No current facility-administered medications for this visit.     Allergies: Patient has no known allergies.  Past Medical History:  Diagnosis Date  . Acute kidney injury ([place]) 01/08/2015  . Chronic back pain   . Cough   . Diabetes mellitus, type 2 ([place])   . Diverticulosis   . Fibroid    patient thinks this was the reason for her hysterectomy  . GERD (gastroesophageal reflux disease)   . History of sebaceous cyst   . Hyperlipemia   . Hyperplastic  colon polyp   . Hypertension   . Hyperthyroidism   . Lumbar radiculopathy   . Shortness of breath 09/13/2013  . Stroke ([place])   . Tobacco abuse     Past Surgical History:  Procedure Laterality Date  . ABDOMINAL HYSTERECTOMY     --?ovaries remain  . COLONOSCOPY W/ BIOPSIES    . RETINOPATHY SURGERY Bilateral 2013   Dr. Zigmund Daniel    Family History  Problem Relation Age of Onset  . Hypertension Mother   . Sleep apnea Mother   . Diabetes Mother   . Hyperlipidemia Mother   . Lung cancer Father        smoker  . Hypertension Sister   . Diabetes Sister   .  Hypertension Brother   . Hypertension Sister   . Diabetes Brother   . Hypertension Brother   . Breast cancer Neg Hx   . Colon cancer Neg Hx   . Esophageal cancer Neg Hx   . Pancreatic cancer Neg Hx   . Liver disease Neg Hx     Social History   Tobacco Use  . Smoking status: Former Smoker    Packs/day: 0.50    Years: 35.00    Pack years: 17.50    Types: Cigarettes    Last attempt to quit: 07/07/2014    Years since quitting: 3.4  . Smokeless tobacco: Never Used  Substance Use Topics  . Alcohol use: No     Subjective:  [name] is here today for follow up of vitamin D toxicity, found on 8/16 OV, she was sent to hospital and admitted, then followed back up with me for same on 09/13/17. At her 8/21 visit, she had stopped calcitrol and vit D supplement as instructed, PTH, intact and calcium were ordered and found to have elevated PTH, referral to endocrinology was made for further evaluation and she has scheduled this visit. Since her visit with me on 09/13/17, she has followed up with nephrology on 10/11/17, labs were updated including vitamin D 25 hydroxy level which was back to normal at 60.2 Her blood pressure remains just slightly elevated today, Reports she sometimes checks readings at home- usually around 130s-140s/80s, also noted to be 140/80 at last nephrology visit on 10/11/17 No syncope, headaches, vision changes, chest pain, shortness of breath, edema.  BP Readings from Last 3 Encounters:  12/15/17 (!) 150/78  11/07/17 (!) 160/60  09/13/17 (!) 148/62    ROS- See HPI  Objective:  Vitals:   12/15/17 0926 12/15/17 1006  BP: (!) 150/60 (!) 150/78  Pulse: (!) 59   SpO2: 99%   Weight: 145 lb (65.8 kg)   Height: 5\' 2"  (1.575 m)     General: Well developed, well nourished, in no acute distress  Skin : Warm and dry.  Head: Normocephalic and atraumatic  Eyes: Sclera and conjunctiva clear; pupils round and reactive to light; extraocular movements intact  Oropharynx: Pink,  supple. No suspicious lesions  Neck: Supple Lungs: Respirations unlabored; clear to auscultation bilaterally without wheeze, rales, rhonchi  CVS exam: normal rate and regular rhythm, S1 and S2 normal. Murmur heard. Extremities: No edema, cyanosis Vessels: Symmetric bilaterally  Neurologic: Alert and oriented; speech intact; face symmetrical; moves all extremities well; CNII-XII intact without focal deficit  Psychiatric: Normal mood and affect.    Assessment:  1. Need for influenza vaccination   2. Vitamin D toxicity, accidental or unintentional, initial encounter   3. Essential hypertension     Plan:    Return  in about 6 months (around 06/15/2018) for routine follow up.  Orders Placed This Encounter  Procedures  . Flu vaccine HIGH DOSE PF    Requested Prescriptions    No prescriptions requested or ordered in this encounter   Reviewed health maintenance:  Need for influenza vaccination- Flu vaccine HIGH DOSE PF

## 2017-12-15 NOTE — Assessment & Plan Note (Signed)
Slightly elevated today, stable at home Continue current medications Also followed by neprhology, continue

## 2017-12-15 NOTE — Assessment & Plan Note (Signed)
Recent Vit D WNL Continue with planned endocrinology F/u

## 2018-01-02 DIAGNOSIS — E1122 Type 2 diabetes mellitus with diabetic chronic kidney disease: Secondary | ICD-10-CM | POA: Diagnosis not present

## 2018-01-02 DIAGNOSIS — N189 Chronic kidney disease, unspecified: Secondary | ICD-10-CM | POA: Diagnosis not present

## 2018-01-02 DIAGNOSIS — N183 Chronic kidney disease, stage 3 (moderate): Secondary | ICD-10-CM | POA: Diagnosis not present

## 2018-01-02 DIAGNOSIS — N2581 Secondary hyperparathyroidism of renal origin: Secondary | ICD-10-CM | POA: Diagnosis not present

## 2018-01-02 DIAGNOSIS — I129 Hypertensive chronic kidney disease with stage 1 through stage 4 chronic kidney disease, or unspecified chronic kidney disease: Secondary | ICD-10-CM | POA: Diagnosis not present

## 2018-01-02 DIAGNOSIS — D631 Anemia in chronic kidney disease: Secondary | ICD-10-CM | POA: Diagnosis not present

## 2018-01-08 ENCOUNTER — Other Ambulatory Visit: Payer: Self-pay | Admitting: Nurse Practitioner

## 2018-01-12 ENCOUNTER — Ambulatory Visit: Payer: Medicare Other | Admitting: Podiatry

## 2018-01-30 ENCOUNTER — Other Ambulatory Visit: Payer: Self-pay | Admitting: Nurse Practitioner

## 2018-01-30 ENCOUNTER — Other Ambulatory Visit: Payer: Self-pay | Admitting: Endocrinology

## 2018-02-01 ENCOUNTER — Other Ambulatory Visit: Payer: Self-pay | Admitting: Nurse Practitioner

## 2018-02-01 DIAGNOSIS — H25813 Combined forms of age-related cataract, bilateral: Secondary | ICD-10-CM | POA: Diagnosis not present

## 2018-02-01 DIAGNOSIS — E119 Type 2 diabetes mellitus without complications: Secondary | ICD-10-CM | POA: Diagnosis not present

## 2018-02-01 LAB — HM DIABETES EYE EXAM

## 2018-02-05 ENCOUNTER — Ambulatory Visit (INDEPENDENT_AMBULATORY_CARE_PROVIDER_SITE_OTHER): Payer: Medicare Other | Admitting: Nurse Practitioner

## 2018-02-05 ENCOUNTER — Encounter: Payer: Self-pay | Admitting: Nurse Practitioner

## 2018-02-05 VITALS — BP 140/70 | HR 58 | Temp 97.8°F | Ht 62.0 in | Wt 147.0 lb

## 2018-02-05 DIAGNOSIS — T8189XA Other complications of procedures, not elsewhere classified, initial encounter: Secondary | ICD-10-CM | POA: Diagnosis not present

## 2018-02-05 NOTE — Progress Notes (Signed)
[name] is a 68 y.o. female with the following history as recorded in EpicCare:  Patient Active Problem List   Diagnosis Date Noted  . Vitamin D toxicity, accidental or unintentional, initial encounter 09/09/2017  . Ataxia 09/09/2017  . Type 2 diabetes mellitus with hyperglycemia ([place]) 09/09/2017  . Chronic kidney disease (CKD)   . Olecranon bursitis of right elbow 03/09/2017  . Thyroid nodule 06/29/2016  . Type 2 diabetes mellitus ([place]) 06/13/2016  . Vitamin D deficiency 01/12/2016  . Medicare annual wellness visit, initial 01/11/2016  . Lacunar infarct, acute ([place]) 03/23/2015  . Non compliance w medication regimen 01/16/2015  . Anemia 01/08/2015  . Stroke ([place]) 01/08/2015  . Hyperlipemia   . Carotid bruit present 12/13/2012  . Routine health maintenance 10/24/2010  . PERIPHERAL EDEMA 09/10/2009  . ELECTROCARDIOGRAM, ABNORMAL 04/11/2008  . HEART MURMUR, SYSTOLIC 46/50/3546  . SEBACEOUS CYST, NECK 12/13/2006  . Hyperthyroidism 09/27/2006  . Secondary diabetes with peripheral vascular disease ([place]) 09/27/2006  . Hyperlipidemia LDL goal <70 09/27/2006  . TOBACCO ABUSE 09/27/2006  . Essential hypertension 09/27/2006    Current Outpatient Medications  Medication Sig Dispense Refill  . amLODipine (NORVASC) 10 MG tablet Take 1 tablet (10 mg total) by mouth daily. 90 tablet 3  . aspirin 325 MG tablet Take 325 mg by mouth daily.    [name] atorvastatin (LIPITOR) 80 MG tablet TAKE 1 TABLET DAILY. FOLLOW-UP APPT IS DUE NEED LIPIDS CHECK MUST SEE PROVIDER FOR FUTURE REFILLS 90 tablet 1  . cloNIDine (CATAPRES) 0.2 MG tablet Take 1 tablet (0.2 mg total) by mouth 3 (three) times daily. 90 tablet 0  . furosemide (LASIX) 40 MG tablet Take 1 tablet (40 mg total) by mouth See admin instructions. Take 1 tablet daily, can take another pill if ankles are swelling -Hold for the next 3 days and resume on 09/13/2017 30 tablet   . glucose blood (ONETOUCH VERIO) test strip USE AS INSTRUCTED (TESTS 2  TIMES A DAY) 100 each 5  . metFORMIN (GLUCOPHAGE-XR) 500 MG 24 hr tablet TAKE 1 TABLET (500 MG TOTAL) DAILY WITH BREAKFAST BY MOUTH. 90 tablet 3  . metoprolol succinate (TOPROL-XL) 100 MG 24 hr tablet TAKE 1 TABLET BY MOUTH DAILY FOR HIGH BLOOD PRESSURE 90 tablet 3  . minoxidil (LONITEN) 2.5 MG tablet Take 2.5 mg by mouth 2 (two) times daily.    [name] test strip USE AS INSTRUCTED (TESTS 2 TIMES A DAY) 100 each 5  . pantoprazole (PROTONIX) 40 MG tablet TAKE 1 TABLET BY MOUTH EVERY DAY 90 tablet 1  . repaglinide (PRANDIN) 2 MG tablet TAKE 0.5 TABLETS BY MOUTH 3 (THREE) TIMES DAILY BEFORE MEALS. 150 tablet 3  . telmisartan-hydrochlorothiazide (MICARDIS HCT) 80-25 MG tablet TAKE 1 TABLET BY MOUTH EVERY DAY 90 tablet 1  . vitamin B-12 (CYANOCOBALAMIN) 100 MCG tablet TAKE 1 TABLET BY MOUTH AT BEDTIME 30 tablet 3   No current facility-administered medications for this visit.     Allergies: Patient has no known allergies.  Past Medical History:  Diagnosis Date  . Acute kidney injury ([place]) 01/08/2015  . Chronic back pain   . Cough   . Diabetes mellitus, type 2 ([place])   . Diverticulosis   . Fibroid    patient thinks this was the reason for her hysterectomy  . GERD (gastroesophageal reflux disease)   . History of sebaceous cyst   . Hyperlipemia   . Hyperplastic colon polyp   . Hypertension   . Hyperthyroidism   .  Lumbar radiculopathy   . Shortness of breath 09/13/2013  . Stroke ([place])   . Tobacco abuse     Past Surgical History:  Procedure Laterality Date  . ABDOMINAL HYSTERECTOMY     --?ovaries remain  . COLONOSCOPY W/ BIOPSIES    . RETINOPATHY SURGERY Bilateral 2013   Dr. Zigmund Daniel    Family History  Problem Relation Age of Onset  . Hypertension Mother   . Sleep apnea Mother   . Diabetes Mother   . Hyperlipidemia Mother   . Lung cancer Father        smoker  . Hypertension Sister   . Diabetes Sister   . Hypertension Brother   . Hypertension Sister   . Diabetes  Brother   . Hypertension Brother   . Breast cancer Neg Hx   . Colon cancer Neg Hx   . Esophageal cancer Neg Hx   . Pancreatic cancer Neg Hx   . Liver disease Neg Hx     Social History   Tobacco Use  . Smoking status: Former Smoker    Packs/day: 0.50    Years: 35.00    Pack years: 17.50    Types: Cigarettes    Last attempt to quit: 07/07/2014    Years since quitting: 3.5  . Smokeless tobacco: Never Used  Substance Use Topics  . Alcohol use: No     Subjective:  [name] is here today requesting evaluation of acute complaint of a "lump" to her lower abdomen, palpable under surgical scar from partial hysterectomy, surgery was about 20 years ago. Tells me that she has always noticed a "small lump" under the incision area but over about the past week, the lump has been larger and mildly tender to touch. She denies fevers, erythema, drainage, injuries. She does not shave or wax the area.  ROS - See HPI  Objective:  Vitals:   02/05/18 1553  BP: 140/70  Pulse: (!) 58  Temp: 97.8 F (36.6 C)  TempSrc: Oral  SpO2: 98%  Weight: 147 lb (66.7 kg)  Height: 5\' 2"  (1.575 m)    General: Well developed, well nourished, in no acute distress  Skin : Warm and dry. Soft, palpable, moveable, nontender mass palpated under incision scar to pelvis without erythema, drainage Head: Normocephalic and atraumatic  Eyes: Sclera and conjunctiva clear; pupils round and reactive to light; extraocular movements intact  Oropharynx: Pink, supple. No suspicious lesions  Neck: Supple Lungs: Effort unlabored, no respiratory distress CVS exam: normal rate and regular rhythm.  Abdomen: Soft; nontender; nondistended; normoactive bowel sounds; no masses or hepatosplenomegaly  Extremities: No edema, cyanosis, clubbing  Vessels: Symmetric bilaterally  Neurologic: Alert and oriented; speech intact; face symmetrical; moves all extremities well; CNII-XII intact without focal deficit  Psychiatric: Normal mood and  affect.  Assessment:  1. Problem involving surgical incision     Plan:   ?scar tissue We discussed trial of conservative measures at home- tylenol PRN, ice She was instructed to f/u if no better in 1 week or if she develops new, worsening symptoms - will consider further testing, ultrasound if this does not improve  No follow-ups on file.  No orders of the defined types were placed in this encounter.   Requested Prescriptions    No prescriptions requested or ordered in this encounter

## 2018-02-05 NOTE — Patient Instructions (Signed)
Please ice the area and use tylenol as discussed  Please let me know if this is not better in 1 week, and we will order an ultrasound of the area   Cryotherapy What is cryotherapy? Cryotherapy, or cold therapy, is a treatment that uses cold temperatures to treat an injury or medical condition. It includes using cold packs or ice packs to reduce pain and swelling. Only use cryotherapy if your doctor says it is okay. How do I use cryotherapy?   Place a towel between the cold source and your skin.  Apply the cold source for no more than 20 minutes at a time.  Check your skin after 5 minutes to make sure there are no signs of a poor response to cold or skin damage. Check for:  White spots on your skin. Your skin may look blotchy or mottled.  Skin that looks blue or pale.  Skin that feels waxy or hard.  Repeat these steps as many times each day as told by your doctor. How can I make a cold pack? When using a cold pack at home to reduce pain and swelling, you can use:  A silica gel cold pack that has been left in the freezer. You can buy this online or in stores.  A plastic bag of frozen vegetables.  A sealable plastic bag that has been filled with crushed ice. Always wrap the pack in a dry or damp towel to avoid direct contact with your skin. Contact a doctor if:  You start to have white spots on your skin. This may give your skin a blotchy or mottled look. ? Your skin turns blue or pale. ? Your skin becomes waxy or hard. ? Your swelling gets worse. This information is not intended to replace advice given to you by your health care provider. Make sure you discuss any questions you have with your health care provider. Document Released: 06/29/2007 Document Revised: 03/17/2016 Document Reviewed: 09/24/2014 Elsevier Interactive Patient Education  2019 [employer].

## 2018-02-07 ENCOUNTER — Inpatient Hospital Stay (HOSPITAL_COMMUNITY): Admission: RE | Admit: 2018-02-07 | Payer: Medicare Other | Source: Ambulatory Visit

## 2018-02-07 ENCOUNTER — Other Ambulatory Visit: Payer: Self-pay | Admitting: Nurse Practitioner

## 2018-02-13 NOTE — Discharge Instructions (Signed)
Epoetin Alfa injection °What is this medicine? °EPOETIN ALFA (e POE e tin AL fa) helps your body make more red blood cells. This medicine is used to treat anemia caused by chronic kidney disease, cancer chemotherapy, or HIV-therapy. It may also be used before surgery if you have anemia. °This medicine may be used for other purposes; ask your health care provider or pharmacist if you have questions. °COMMON BRAND NAME(S): Epogen, Procrit, Retacrit °What should I tell my health care provider before I take this medicine? °They need to know if you have any of these conditions: °-cancer °-heart disease °-high blood pressure °-history of blood clots °-history of stroke °-low levels of folate, iron, or vitamin B12 in the blood °-seizures °-an unusual or allergic reaction to erythropoietin, albumin, benzyl alcohol, hamster proteins, other medicines, foods, dyes, or preservatives °-pregnant or trying to get pregnant °-breast-feeding °How should I use this medicine? °This medicine is for injection into a vein or under the skin. It is usually given by a health care professional in a hospital or clinic setting. °If you get this medicine at home, you will be taught how to prepare and give this medicine. Use exactly as directed. Take your medicine at regular intervals. Do not take your medicine more often than directed. °It is important that you put your used needles and syringes in a special sharps container. Do not put them in a trash can. If you do not have a sharps container, call your pharmacist or healthcare provider to get one. °A special MedGuide will be given to you by the pharmacist with each prescription and refill. Be sure to read this information carefully each time. °Talk to your pediatrician regarding the use of this medicine in children. While this drug may be prescribed for selected conditions, precautions do apply. °Overdosage: If you think you have taken too much of this medicine contact a poison control center  or emergency room at once. °NOTE: This medicine is only for you. Do not share this medicine with others. °What if I miss a dose? °If you miss a dose, take it as soon as you can. If it is almost time for your next dose, take only that dose. Do not take double or extra doses. °What may interact with this medicine? °Interactions have not been studied. °This list may not describe all possible interactions. Give your health care provider a list of all the medicines, herbs, non-prescription drugs, or dietary supplements you use. Also tell them if you smoke, drink alcohol, or use illegal drugs. Some items may interact with your medicine. °What should I watch for while using this medicine? °Your condition will be monitored carefully while you are receiving this medicine. °You may need blood work done while you are taking this medicine. °This medicine may cause a decrease in vitamin B6. You should make sure that you get enough vitamin B6 while you are taking this medicine. Discuss the foods you eat and the vitamins you take with your health care professional. °What side effects may I notice from receiving this medicine? °Side effects that you should report to your doctor or health care professional as soon as possible: °-allergic reactions like skin rash, itching or hives, swelling of the face, lips, or tongue °-seizures °-signs and symptoms of a blood clot such as breathing problems; changes in vision; chest pain; severe, sudden headache; pain, swelling, warmth in the leg; trouble speaking; sudden numbness or weakness of the face, arm or leg °-signs and symptoms of a stroke   like changes in vision; confusion; trouble speaking or understanding; severe headaches; sudden numbness or weakness of the face, arm or leg; trouble walking; dizziness; loss of balance or coordination °Side effects that usually do not require medical attention (report to your doctor or health care professional if they continue or are  bothersome): °-chills °-cough °-dizziness °-fever °-headaches °-joint pain °-muscle cramps °-muscle pain °-nausea, vomiting °-pain, redness, or irritation at site where injected °This list may not describe all possible side effects. Call your doctor for medical advice about side effects. You may report side effects to FDA at 1-800-FDA-1088. °Where should I keep my medicine? °Keep out of the reach of children. °Store in a refrigerator between 2 and 8 degrees C (36 and 46 degrees F). Do not freeze or shake. Throw away any unused portion if using a single-dose vial. Multi-dose vials can be kept in the refrigerator for up to 21 days after the initial dose. Throw away unused medicine. °NOTE: This sheet is a summary. It may not cover all possible information. If you have questions about this medicine, talk to your doctor, pharmacist, or health care provider. °© 2019 Elsevier/Gold Standard (2016-08-19 08:35:19) ° °

## 2018-02-14 ENCOUNTER — Encounter (HOSPITAL_COMMUNITY)
Admission: RE | Admit: 2018-02-14 | Discharge: 2018-02-14 | Disposition: A | Discharge status: Home / Self Care | Payer: Medicare Other | Source: Ambulatory Visit | Attending: Nephrology | Admitting: Nephrology

## 2018-02-14 VITALS — BP 161/56 | HR 70 | Temp 98.2°F | Resp 20

## 2018-02-14 DIAGNOSIS — N183 Chronic kidney disease, stage 3 unspecified: Secondary | ICD-10-CM

## 2018-02-14 LAB — POCT HEMOGLOBIN-HEMACUE: Hemoglobin: 9 g/dL — ABNORMAL LOW (ref 12.0–15.0)

## 2018-02-14 MED ORDER — EPOETIN ALFA-EPBX 10000 UNIT/ML IJ SOLN
10000.0000 [IU] | INTRAMUSCULAR | Status: DC
  Administered 2018-02-14: 10000 [IU] via SUBCUTANEOUS
  Filled 2018-02-14: qty 1

## 2018-02-19 ENCOUNTER — Other Ambulatory Visit: Payer: Self-pay | Admitting: Nurse Practitioner

## 2018-02-19 DIAGNOSIS — I10 Essential (primary) hypertension: Secondary | ICD-10-CM

## 2018-03-05 ENCOUNTER — Other Ambulatory Visit: Payer: Self-pay | Admitting: Emergency Medicine

## 2018-03-05 MED ORDER — ATORVASTATIN CALCIUM 80 MG PO TABS: ORAL_TABLET | ORAL | 0 refills | Status: DC

## 2018-03-06 ENCOUNTER — Encounter: Payer: Self-pay | Admitting: Nurse Practitioner

## 2018-03-07 ENCOUNTER — Encounter: Payer: Self-pay | Admitting: Endocrinology

## 2018-03-07 ENCOUNTER — Ambulatory Visit: Payer: Medicare Other | Admitting: Endocrinology

## 2018-03-07 ENCOUNTER — Encounter (HOSPITAL_COMMUNITY): Payer: Medicare Other

## 2018-03-07 VITALS — BP 144/70 | HR 60 | Ht 62.0 in | Wt 145.4 lb

## 2018-03-07 DIAGNOSIS — E1122 Type 2 diabetes mellitus with diabetic chronic kidney disease: Secondary | ICD-10-CM | POA: Diagnosis not present

## 2018-03-07 DIAGNOSIS — N183 Chronic kidney disease, stage 3 (moderate): Secondary | ICD-10-CM

## 2018-03-07 LAB — POCT GLYCOSYLATED HEMOGLOBIN (HGB A1C): Hemoglobin A1C: 7.2 % — AB (ref 4.0–5.6)

## 2018-03-07 MED ORDER — REPAGLINIDE 2 MG PO TABS: 2.0000 mg | ORAL_TABLET | Freq: Three times a day (TID) | ORAL | 11 refills | Status: DC

## 2018-03-07 NOTE — Progress Notes (Signed)
Subjective:    Patient ID: [name], female    DOB: 1950/03/14, 68 y.o.   MRN: [id number]  HPI Pt returns for f/u of diabetes mellitus:  DM type: 2, but she may be developing type 1 Dx'ed: 0867 Complications: polyneuropathy, renal insuff, CVA's, and PDR.  Therapy: 2 oral meds GDM: never DKA: never Severe hypoglycemia: never.   Pancreatitis: never Pancreatic imaging: never Other: she cannot afford brand-name meds; she has never been on insulin, but she has taken trulicity; renal insuff limits metformin dosage; edema limits rx options.   Interval history:  no cbg record, but states cbg's are well-controlled.  She takes repaglinide just 1/2 tab 3 times a day (just before each meal).   pt states she feels well in general. Past Medical History:  Diagnosis Date  . Acute kidney injury ([place]) 01/08/2015  . Chronic back pain   . Cough   . Diabetes mellitus, type 2 ([place])   . Diverticulosis   . Fibroid    patient thinks this was the reason for her hysterectomy  . GERD (gastroesophageal reflux disease)   . History of sebaceous cyst   . Hyperlipemia   . Hyperplastic colon polyp   . Hypertension   . Hyperthyroidism   . Lumbar radiculopathy   . Shortness of breath 09/13/2013  . Stroke ([place])   . Tobacco abuse     Past Surgical History:  Procedure Laterality Date  . ABDOMINAL HYSTERECTOMY     --?ovaries remain  . COLONOSCOPY W/ BIOPSIES    . RETINOPATHY SURGERY Bilateral 2013   Dr. Zigmund Daniel    Social History   Socioeconomic History  . Marital status: Divorced    Spouse name: Not on file  . Number of children: 1  . Years of education: 46  . Highest education level: Not on file  Occupational History  . Occupation: Insurance account manager: [employer]    Comment: Retired  Scientific laboratory technician  . Financial resource strain: Not hard at all  . Food insecurity:    Worry: Never true    Inability: Never true  . Transportation needs:    Medical: No    Non-medical: No   Tobacco Use  . Smoking status: Former Smoker    Packs/day: 0.50    Years: 35.00    Pack years: 17.50    Types: Cigarettes    Last attempt to quit: 07/07/2014    Years since quitting: 3.6  . Smokeless tobacco: Never Used  Substance and Sexual Activity  . Alcohol use: No  . Drug use: No  . Sexual activity: Never    Partners: Male    Birth control/protection: Surgical    Comment: TAH--?ovaries remain  Lifestyle  . Physical activity:    Days per week: 6 days    Minutes per session: 60 min  . Stress: Not at all  Relationships  . Social connections:    Talks on phone: More than three times a week    Gets together: More than three times a week    Attends religious service: More than 4 times per year    Active member of club or organization: Yes    Attends meetings of clubs or organizations: More than 4 times per year    Relationship status: Divorced  . Intimate partner violence:    Fear of current or ex partner: No    Emotionally abused: No    Physically abused: No    Forced sexual activity: No  Other Topics Concern  . Not on file  Social History Narrative   Divorced 1 son    Retired Museum/gallery curator for [employer]   Former smoker no alcohol caffeine or drug use report denies abuse and feels safe at home.    Current Outpatient Medications on File Prior to Visit  Medication Sig Dispense Refill  . amLODipine (NORVASC) 10 MG tablet Take 1 tablet (10 mg total) by mouth daily. 90 tablet 3  . aspirin 325 MG tablet Take 325 mg by mouth daily.    [name] atorvastatin (LIPITOR) 80 MG tablet TAKE 1 TABLET DAILY. FOLLOW-UP APPT IS DUE NEED LIPIDS CHECK MUST SEE PROVIDER FOR FUTURE REFILLS 90 tablet 0  . cloNIDine (CATAPRES) 0.2 MG tablet Take 1 tablet (0.2 mg total) by mouth 3 (three) times daily. 90 tablet 0  . furosemide (LASIX) 40 MG tablet Take 1 tablet (40 mg total) by mouth See admin instructions. Take 1 tablet daily, can take another pill if ankles are swelling -Hold for the  next 3 days and resume on 09/13/2017 30 tablet   . glucose blood (ONETOUCH VERIO) test strip USE AS INSTRUCTED (TESTS 2 TIMES A DAY) 100 each 5  . metoprolol succinate (TOPROL-XL) 100 MG 24 hr tablet TAKE 1 TABLET BY MOUTH DAILY FOR HIGH BLOOD PRESSURE 90 tablet 1  . minoxidil (LONITEN) 2.5 MG tablet Take 2.5 mg by mouth 2 (two) times daily.    . pantoprazole (PROTONIX) 40 MG tablet TAKE 1 TABLET BY MOUTH EVERY DAY 90 tablet 1  . telmisartan-hydrochlorothiazide (MICARDIS HCT) 80-25 MG tablet TAKE 1 TABLET BY MOUTH EVERY DAY 90 tablet 1  . vitamin B-12 (CYANOCOBALAMIN) 100 MCG tablet TAKE 1 TABLET BY MOUTH AT BEDTIME 90 tablet 1   No current facility-administered medications on file prior to visit.     No Known Allergies  Family History  Problem Relation Age of Onset  . Hypertension Mother   . Sleep apnea Mother   . Diabetes Mother   . Hyperlipidemia Mother   . Lung cancer Father        smoker  . Hypertension Sister   . Diabetes Sister   . Hypertension Brother   . Hypertension Sister   . Diabetes Brother   . Hypertension Brother   . Breast cancer Neg Hx   . Colon cancer Neg Hx   . Esophageal cancer Neg Hx   . Pancreatic cancer Neg Hx   . Liver disease Neg Hx     BP (!) 144/70 (BP Location: Right Arm, Patient Position: Sitting, Cuff Size: Normal)   Pulse 60   Ht 5\' 2"  (1.575 m)   Wt 145 lb 6.4 oz (66 kg)   SpO2 96%   BMI 26.59 kg/m    Review of Systems She denies hypoglycemia    Objective:   Physical Exam VITAL SIGNS:  See vs page GENERAL: no distress Pulses: dorsalis pedis intact bilat.   MSK: no deformity of the feet CV: trace bilat leg edema Skin:  no ulcer on the feet.  normal color and temp on the feet. Neuro: sensation is intact to touch on the feet.     Lab Results  Component Value Date   HGBA1C 7.2 (A) 03/07/2018   Lab Results  Component Value Date   CREATININE 2.03 (H) 09/13/2017   BUN 50 (H) 09/13/2017   NA 131 (L) 09/13/2017   K 5.0 09/13/2017    CL 99 09/13/2017   CO2 25 09/13/2017  Assessment & Plan:  Type 2 DM: worse Renal failure: this limits rx options Edema: this also limits rx options.  Patient Instructions  I have sent a prescription to your pharmacy, to increase the repaglinide to a whole pill 3 times a day (just before each meal), and: Please stop taking the metformin. check your blood sugar once a day.  vary the time of day when you check, between before the 3 meals, and at bedtime.  also check if you have symptoms of your blood sugar being too high or too low.  please keep a record of the readings and bring it to your next appointment here (or you can bring the meter itself).  You can write it on any piece of paper.  please call us sooner if your blood sugar goes below 70, or if you have a lot of readings over 200.   When you come back, please bring the vitamin-D pill from Dr Lorrene Reid, so we can load it into your medication list. Please come back for a follow-up appointment in 2 months.

## 2018-03-07 NOTE — Patient Instructions (Addendum)
I have sent a prescription to your pharmacy, to increase the repaglinide to a whole pill 3 times a day (just before each meal), and: Please stop taking the metformin. check your blood sugar once a day.  vary the time of day when you check, between before the 3 meals, and at bedtime.  also check if you have symptoms of your blood sugar being too high or too low.  please keep a record of the readings and bring it to your next appointment here (or you can bring the meter itself).  You can write it on any piece of paper.  please call us sooner if your blood sugar goes below 70, or if you have a lot of readings over 200.   When you come back, please bring the vitamin-D pill from Dr Lorrene Reid, so we can load it into your medication list. Please come back for a follow-up appointment in 2 months.

## 2018-03-08 ENCOUNTER — Other Ambulatory Visit: Payer: Self-pay | Admitting: Nurse Practitioner

## 2018-03-08 DIAGNOSIS — Z1231 Encounter for screening mammogram for malignant neoplasm of breast: Secondary | ICD-10-CM

## 2018-03-13 ENCOUNTER — Other Ambulatory Visit (HOSPITAL_COMMUNITY): Payer: Self-pay

## 2018-03-14 ENCOUNTER — Ambulatory Visit (HOSPITAL_COMMUNITY)
Admission: RE | Admit: 2018-03-14 | Discharge: 2018-03-14 | Disposition: A | Discharge status: Home / Self Care | Payer: Medicare Other | Source: Ambulatory Visit | Attending: Nephrology | Admitting: Nephrology

## 2018-03-14 VITALS — BP 153/68 | HR 64 | Temp 98.3°F | Resp 20

## 2018-03-14 DIAGNOSIS — N183 Chronic kidney disease, stage 3 unspecified: Secondary | ICD-10-CM

## 2018-03-14 LAB — POCT HEMOGLOBIN-HEMACUE: Hemoglobin: 9.5 g/dL — ABNORMAL LOW (ref 12.0–15.0)

## 2018-03-14 MED ORDER — EPOETIN ALFA-EPBX 10000 UNIT/ML IJ SOLN
20000.0000 [IU] | INTRAMUSCULAR | Status: DC
  Administered 2018-03-14: 20000 [IU] via SUBCUTANEOUS
  Filled 2018-03-14: qty 2

## 2018-03-15 ENCOUNTER — Other Ambulatory Visit: Payer: Self-pay

## 2018-03-15 ENCOUNTER — Ambulatory Visit: Payer: Medicare Other | Admitting: Podiatry

## 2018-03-15 DIAGNOSIS — M79674 Pain in right toe(s): Secondary | ICD-10-CM

## 2018-03-15 DIAGNOSIS — B351 Tinea unguium: Secondary | ICD-10-CM

## 2018-03-15 DIAGNOSIS — E1151 Type 2 diabetes mellitus with diabetic peripheral angiopathy without gangrene: Secondary | ICD-10-CM

## 2018-03-15 DIAGNOSIS — M79675 Pain in left toe(s): Secondary | ICD-10-CM | POA: Diagnosis not present

## 2018-03-15 NOTE — Patient Instructions (Signed)

## 2018-03-19 ENCOUNTER — Other Ambulatory Visit (HOSPITAL_COMMUNITY): Payer: Self-pay | Admitting: *Deleted

## 2018-03-23 ENCOUNTER — Encounter: Payer: Self-pay | Admitting: Podiatry

## 2018-03-23 NOTE — Progress Notes (Signed)
Subjective: [name] is a 68 y.o. y.o. female who presents for preventative diabetic foot care today with PAD and cc of painful, discolored, thick toenails and which interfere with daily activities. Pain is aggravated when wearing enclosed shoe gear. Pain is relieved with periodic professional debridement.  Lance Sell, NP is her PCP. Last visit was 02/05/2018.  Patient states she is no longer taking Metformin.   Current Outpatient Medications:  .  amLODipine (NORVASC) 10 MG tablet, Take 1 tablet (10 mg total) by mouth daily., Disp: 90 tablet, Rfl: 3 .  aspirin 325 MG tablet, Take 325 mg by mouth daily., Disp: , Rfl:  .  atorvastatin (LIPITOR) 80 MG tablet, TAKE 1 TABLET DAILY. FOLLOW-UP APPT IS DUE NEED LIPIDS CHECK MUST SEE PROVIDER FOR FUTURE REFILLS, Disp: 90 tablet, Rfl: 0 .  calcitRIOL (ROCALTROL) 0.25 MCG capsule, 1 (ONE) CAPSULE THREE TIMES A WEEK, Disp: , Rfl:  .  cloNIDine (CATAPRES) 0.2 MG tablet, Take 1 tablet (0.2 mg total) by mouth 3 (three) times daily., Disp: 90 tablet, Rfl: 0 .  furosemide (LASIX) 40 MG tablet, Take 1 tablet (40 mg total) by mouth See admin instructions. Take 1 tablet daily, can take another pill if ankles are swelling -Hold for the next 3 days and resume on 09/13/2017, Disp: 30 tablet, Rfl:  .  glucose blood (ONETOUCH VERIO) test strip, USE AS INSTRUCTED (TESTS 2 TIMES A DAY), Disp: 100 each, Rfl: 5 .  metoprolol succinate (TOPROL-XL) 100 MG 24 hr tablet, TAKE 1 TABLET BY MOUTH DAILY FOR HIGH BLOOD PRESSURE, Disp: 90 tablet, Rfl: 1 .  minoxidil (LONITEN) 2.5 MG tablet, Take 2.5 mg by mouth 2 (two) times daily., Disp: , Rfl:  .  pantoprazole (PROTONIX) 40 MG tablet, TAKE 1 TABLET BY MOUTH EVERY DAY, Disp: 90 tablet, Rfl: 1 .  repaglinide (PRANDIN) 2 MG tablet, Take 1 tablet (2 mg total) by mouth 3 (three) times daily before meals., Disp: 90 tablet, Rfl: 11 .  telmisartan-hydrochlorothiazide (MICARDIS HCT) 80-25 MG tablet, TAKE 1 TABLET BY MOUTH  EVERY DAY, Disp: 90 tablet, Rfl: 1 .  vitamin B-12 (CYANOCOBALAMIN) 100 MCG tablet, TAKE 1 TABLET BY MOUTH AT BEDTIME, Disp: 90 tablet, Rfl: 1  No Known Allergies  Objective: Vascular Examination: Capillary refill time <3 seconds x 10 digits  Dorsalis pedis pulses 1/4 b/l  Posterior tibial pulses absent b/l  No digital hair x 10 digits  Skin temperature gradient WNL b/l  Dermatological Examination: Skin thin and atrophic b/l  Toenails 1-5 b/l discolored, thick, dystrophic with subungual debris and pain with palpation to nailbeds due to thickness of nails.  Hyperkeratotic lesion noted submetatarsal head  Hyperkeratotic lesion noted dorsal PIPJ  Musculoskeletal: Muscle strength 5/5 to all LE muscle groups  Neurological: Sensation intact with 10 gram monofilament.  Assessment: 1. Painful onychomycosis toenails 1-5 b/l 2. NIDDM with Peripheral arterial disease  Plan: 1. Continue diabetic foot care principles. Literature dispensed. 2. Toenails 1-5 b/l were debrided in length and girth without iatrogenic bleeding. 3. Patient to continue soft, supportive shoe gear. 4. Patient to report any pedal injuries to medical professional immediately. 5. Follow up 3 months.  6. Patient/POA to call should there be a concern in the interim.

## 2018-04-11 ENCOUNTER — Ambulatory Visit: Payer: Medicare Other

## 2018-04-11 ENCOUNTER — Encounter (HOSPITAL_COMMUNITY): Payer: Medicare Other

## 2018-05-08 ENCOUNTER — Other Ambulatory Visit: Payer: Self-pay

## 2018-05-09 ENCOUNTER — Encounter (HOSPITAL_COMMUNITY)
Admission: RE | Admit: 2018-05-09 | Discharge: 2018-05-09 | Disposition: A | Discharge status: Home / Self Care | Payer: Medicare Other | Source: Ambulatory Visit | Attending: Nephrology | Admitting: Nephrology

## 2018-05-09 VITALS — BP 172/72 | HR 67 | Temp 97.2°F

## 2018-05-09 DIAGNOSIS — N183 Chronic kidney disease, stage 3 unspecified: Secondary | ICD-10-CM

## 2018-05-09 DIAGNOSIS — D631 Anemia in chronic kidney disease: Secondary | ICD-10-CM | POA: Diagnosis not present

## 2018-05-09 LAB — IRON AND TIBC
Iron: 44 ug/dL (ref 28–170)
Saturation Ratios: 12 % (ref 10.4–31.8)
TIBC: 356 ug/dL (ref 250–450)
UIBC: 312 ug/dL

## 2018-05-09 LAB — FERRITIN: Ferritin: 21 ng/mL (ref 11–307)

## 2018-05-09 MED ORDER — EPOETIN ALFA-EPBX 10000 UNIT/ML IJ SOLN
20000.0000 [IU] | INTRAMUSCULAR | Status: DC
  Administered 2018-05-09: 20000 [IU] via SUBCUTANEOUS
  Filled 2018-05-09: qty 2

## 2018-05-10 ENCOUNTER — Telehealth: Payer: Self-pay

## 2018-05-10 LAB — POCT HEMOGLOBIN-HEMACUE: Hemoglobin: 9 g/dL — ABNORMAL LOW (ref 12.0–15.0)

## 2018-05-10 NOTE — Telephone Encounter (Signed)
patient returning Ammies call- she requested a call back as soon as possible because she has to go to work

## 2018-05-10 NOTE — Telephone Encounter (Signed)
Returned pt call. Requesting to reschedule appt to mid May. Appt rescheduled per her request.

## 2018-05-11 ENCOUNTER — Ambulatory Visit: Payer: Medicare Other | Admitting: Endocrinology

## 2018-05-14 ENCOUNTER — Ambulatory Visit: Payer: Medicare Other

## 2018-06-06 ENCOUNTER — Other Ambulatory Visit: Payer: Self-pay

## 2018-06-06 ENCOUNTER — Ambulatory Visit (HOSPITAL_COMMUNITY)
Admission: RE | Admit: 2018-06-06 | Discharge: 2018-06-06 | Disposition: A | Discharge status: Home / Self Care | Payer: Medicare Other | Source: Ambulatory Visit | Attending: Nephrology | Admitting: Nephrology

## 2018-06-06 VITALS — BP 145/68 | HR 60 | Temp 97.3°F | Resp 20

## 2018-06-06 DIAGNOSIS — D631 Anemia in chronic kidney disease: Secondary | ICD-10-CM | POA: Insufficient documentation

## 2018-06-06 DIAGNOSIS — N183 Chronic kidney disease, stage 3 unspecified: Secondary | ICD-10-CM

## 2018-06-06 LAB — FERRITIN: Ferritin: 13 ng/mL (ref 11–307)

## 2018-06-06 LAB — IRON AND TIBC
Iron: 47 ug/dL (ref 28–170)
Saturation Ratios: 13 % (ref 10.4–31.8)
TIBC: 367 ug/dL (ref 250–450)
UIBC: 320 ug/dL

## 2018-06-06 LAB — POCT HEMOGLOBIN-HEMACUE: Hemoglobin: 9.3 g/dL — ABNORMAL LOW (ref 12.0–15.0)

## 2018-06-06 MED ORDER — EPOETIN ALFA-EPBX 10000 UNIT/ML IJ SOLN
20000.0000 [IU] | INTRAMUSCULAR | Status: DC
  Administered 2018-06-06: 20000 [IU] via SUBCUTANEOUS
  Filled 2018-06-06: qty 2

## 2018-06-12 ENCOUNTER — Other Ambulatory Visit (HOSPITAL_COMMUNITY): Payer: Self-pay | Admitting: *Deleted

## 2018-06-12 ENCOUNTER — Ambulatory Visit: Payer: Medicare Other | Admitting: Endocrinology

## 2018-06-13 ENCOUNTER — Ambulatory Visit (HOSPITAL_COMMUNITY)
Admission: RE | Admit: 2018-06-13 | Discharge: 2018-06-13 | Disposition: A | Discharge status: Home / Self Care | Payer: Medicare Other | Source: Ambulatory Visit | Attending: Nephrology | Admitting: Nephrology

## 2018-06-13 ENCOUNTER — Other Ambulatory Visit: Payer: Self-pay

## 2018-06-13 DIAGNOSIS — D631 Anemia in chronic kidney disease: Secondary | ICD-10-CM | POA: Insufficient documentation

## 2018-06-13 MED ORDER — SODIUM CHLORIDE 0.9 % IV SOLN
510.0000 mg | Freq: Once | INTRAVENOUS | Status: AC
  Administered 2018-06-13: 510 mg via INTRAVENOUS
  Filled 2018-06-13: qty 510

## 2018-06-14 ENCOUNTER — Ambulatory Visit: Payer: Medicare Other | Admitting: Podiatry

## 2018-06-15 ENCOUNTER — Ambulatory Visit: Payer: Medicare Other | Admitting: Nurse Practitioner

## 2018-06-21 ENCOUNTER — Telehealth: Payer: Self-pay | Admitting: Family

## 2018-06-21 NOTE — Telephone Encounter (Signed)
Copied from [place] [id number]. Topic: Appointment Scheduling - Scheduling Inquiry for Clinic >> Jun 21, 2018  9:33 AM Yvette Rack wrote: Reason for CRM: Pt would like to schedule a TOC appt as soon as possible. Offered other [place] locations but pt declined. Pt requests call back

## 2018-06-21 NOTE — Telephone Encounter (Signed)
Patient is requesting a transfer of care appointment with someone at the [place] office.  Would you be willing to see this patient to establish care as a TOC visit?

## 2018-06-22 ENCOUNTER — Other Ambulatory Visit: Payer: Self-pay | Admitting: *Deleted

## 2018-06-22 MED ORDER — ATORVASTATIN CALCIUM 80 MG PO TABS: ORAL_TABLET | ORAL | 0 refills | Status: DC

## 2018-06-22 NOTE — Telephone Encounter (Signed)
Appointment scheduled.

## 2018-06-22 NOTE — Telephone Encounter (Signed)
Yes, this is fine.

## 2018-06-26 ENCOUNTER — Other Ambulatory Visit: Payer: Self-pay

## 2018-06-26 DIAGNOSIS — I129 Hypertensive chronic kidney disease with stage 1 through stage 4 chronic kidney disease, or unspecified chronic kidney disease: Secondary | ICD-10-CM | POA: Diagnosis not present

## 2018-06-26 DIAGNOSIS — N183 Chronic kidney disease, stage 3 (moderate): Secondary | ICD-10-CM | POA: Diagnosis not present

## 2018-06-26 DIAGNOSIS — N189 Chronic kidney disease, unspecified: Secondary | ICD-10-CM | POA: Diagnosis not present

## 2018-06-26 DIAGNOSIS — N2581 Secondary hyperparathyroidism of renal origin: Secondary | ICD-10-CM | POA: Diagnosis not present

## 2018-06-26 DIAGNOSIS — E1122 Type 2 diabetes mellitus with diabetic chronic kidney disease: Secondary | ICD-10-CM | POA: Diagnosis not present

## 2018-06-26 DIAGNOSIS — D631 Anemia in chronic kidney disease: Secondary | ICD-10-CM | POA: Diagnosis not present

## 2018-06-28 ENCOUNTER — Ambulatory Visit: Payer: Medicare Other | Admitting: Endocrinology

## 2018-06-28 ENCOUNTER — Encounter: Payer: Self-pay | Admitting: Endocrinology

## 2018-06-28 ENCOUNTER — Other Ambulatory Visit: Payer: Self-pay

## 2018-06-28 VITALS — BP 148/70 | HR 64 | Temp 97.6°F | Wt 141.6 lb

## 2018-06-28 DIAGNOSIS — I10 Essential (primary) hypertension: Secondary | ICD-10-CM

## 2018-06-28 DIAGNOSIS — E1129 Type 2 diabetes mellitus with other diabetic kidney complication: Secondary | ICD-10-CM

## 2018-06-28 DIAGNOSIS — E1122 Type 2 diabetes mellitus with diabetic chronic kidney disease: Secondary | ICD-10-CM

## 2018-06-28 DIAGNOSIS — N183 Chronic kidney disease, stage 3 unspecified: Secondary | ICD-10-CM

## 2018-06-28 LAB — POCT GLYCOSYLATED HEMOGLOBIN (HGB A1C): Hemoglobin A1C: 7.6 % — AB (ref 4.0–5.6)

## 2018-06-28 MED ORDER — BROMOCRIPTINE MESYLATE 2.5 MG PO TABS: ORAL_TABLET | ORAL | 11 refills | Status: DC

## 2018-06-28 NOTE — Progress Notes (Signed)
Subjective:    Patient ID: [name], female    DOB: [birth year], 68 y.o.   MRN: [id number]  HPI Pt returns for f/u of diabetes mellitus:  DM type: 2, but she may be developing type 1 Dx'ed: 3474 Complications: polyneuropathy, renal insuff, CVA's, and PDR.  Therapy: repaglinide GDM: never DKA: never Severe hypoglycemia: never.   Pancreatitis: never Pancreatic imaging: never Other: she cannot afford brand-name meds; she has never been on insulin, but she has taken trulicity; renal insuff limits metformin dosage; edema limits rx options.   Interval history:  no cbg record, but states cbg's are well-controlled.  pt states she feels well in general.   Past Medical History:  Diagnosis Date  . Acute kidney injury ([place]) 01/08/2015  . Chronic back pain   . Cough   . Diabetes mellitus, type 2 ([place])   . Diverticulosis   . Fibroid    patient thinks this was the reason for her hysterectomy  . GERD (gastroesophageal reflux disease)   . History of sebaceous cyst   . Hyperlipemia   . Hyperplastic colon polyp   . Hypertension   . Hyperthyroidism   . Lumbar radiculopathy   . Shortness of breath 09/13/2013  . Stroke ([place])   . Tobacco abuse     Past Surgical History:  Procedure Laterality Date  . ABDOMINAL HYSTERECTOMY     --?ovaries remain  . COLONOSCOPY W/ BIOPSIES    . RETINOPATHY SURGERY Bilateral 2013   Dr. Zigmund Daniel    Social History   Socioeconomic History  . Marital status: Divorced    Spouse name: Not on file  . Number of children: 1  . Years of education: 25  . Highest education level: Not on file  Occupational History  . Occupation: Insurance account manager: [employer]    Comment: Retired  Scientific laboratory technician  . Financial resource strain: Not hard at all  . Food insecurity:    Worry: Never true    Inability: Never true  . Transportation needs:    Medical: No    Non-medical: No  Tobacco Use  . Smoking status: Former Smoker    Packs/day: 0.50   Years: 35.00    Pack years: 17.50    Types: Cigarettes    Last attempt to quit: 07/07/2014    Years since quitting: 3.9  . Smokeless tobacco: Never Used  Substance and Sexual Activity  . Alcohol use: No  . Drug use: No  . Sexual activity: Never    Partners: Male    Birth control/protection: Surgical    Comment: TAH--?ovaries remain  Lifestyle  . Physical activity:    Days per week: 6 days    Minutes per session: 60 min  . Stress: Not at all  Relationships  . Social connections:    Talks on phone: More than three times a week    Gets together: More than three times a week    Attends religious service: More than 4 times per year    Active member of club or organization: Yes    Attends meetings of clubs or organizations: More than 4 times per year    Relationship status: Divorced  . Intimate partner violence:    Fear of current or ex partner: No    Emotionally abused: No    Physically abused: No    Forced sexual activity: No  Other Topics Concern  . 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    Current Outpatient Medications on File Prior to Visit  Medication Sig Dispense Refill  . amLODipine (NORVASC) 10 MG tablet Take 1 tablet (10 mg total) by mouth daily. 90 tablet 3  . aspirin 325 MG tablet Take 325 mg by mouth daily.    [name] atorvastatin (LIPITOR) 80 MG tablet TAKE 1 TABLET DAILY. FOLLOW-UP APPT IS DUE NEED LIPIDS CHECK MUST SEE PROVIDER FOR FUTURE REFILLS 90 tablet 0  . calcitRIOL (ROCALTROL) 0.25 MCG capsule 1 (ONE) CAPSULE THREE TIMES A WEEK    . cloNIDine (CATAPRES) 0.2 MG tablet Take 1 tablet (0.2 mg total) by mouth 3 (three) times daily. 90 tablet 0  . furosemide (LASIX) 40 MG tablet Take 1 tablet (40 mg total) by mouth See admin instructions. Take 1 tablet daily, can take another pill if ankles are swelling -Hold  for the next 3 days and resume on 09/13/2017 30 tablet   . glucose blood (ONETOUCH VERIO) test strip USE AS INSTRUCTED (TESTS 2 TIMES A DAY) 100 each 5  . metoprolol succinate (TOPROL-XL) 100 MG 24 hr tablet TAKE 1 TABLET BY MOUTH DAILY FOR HIGH BLOOD PRESSURE 90 tablet 1  . minoxidil (LONITEN) 2.5 MG tablet Take 2.5 mg by mouth 2 (two) times daily.    . pantoprazole (PROTONIX) 40 MG tablet TAKE 1 TABLET BY MOUTH EVERY DAY 90 tablet 1  . repaglinide (PRANDIN) 2 MG tablet Take 1 tablet (2 mg total) by mouth 3 (three) times daily before meals. 90 tablet 11  . telmisartan-hydrochlorothiazide (MICARDIS HCT) 80-25 MG tablet TAKE 1 TABLET BY MOUTH EVERY DAY 90 tablet 1  . vitamin B-12 (CYANOCOBALAMIN) 100 MCG tablet TAKE 1 TABLET BY MOUTH AT BEDTIME 90 tablet 1   No current facility-administered medications on file prior to visit.     No Known Allergies  Family History  Problem Relation Age of Onset  . Hypertension Mother   . Sleep apnea Mother   . Diabetes Mother   . Hyperlipidemia Mother   . Lung cancer Father        smoker  . Hypertension Sister   . Diabetes Sister   . Hypertension Brother   . Hypertension Sister   . Diabetes Brother   . Hypertension Brother   . Breast cancer Neg Hx   . Colon cancer Neg Hx   . Esophageal cancer Neg Hx   . Pancreatic cancer Neg Hx   . Liver disease Neg Hx     BP (!) 148/70 (BP Location: Right Arm, Patient Position: Sitting, Cuff Size: Normal)   Pulse 64   Temp 97.6 F (36.4 C) (Oral)   Wt 141 lb 9.6 oz (64.2 kg)   SpO2 97%   BMI 25.90 kg/m   Review of Systems She denies hypoglycemia.     Objective:   Physical Exam VITAL SIGNS:  See vs page GENERAL: no distress Pulses: dorsalis pedis intact bilat.   MSK: no deformity of the feet CV: no leg edema Skin:  no ulcer on the feet.  normal color and temp on the feet. Neuro: sensation is intact to touch on the feet.   Ext: There is bilateral onychomycosis of the toenails.    Lab Results   Component Value Date   CREATININE 2.03 (H) 09/13/2017   BUN 50 (H) 09/13/2017   NA 131 (L) 09/13/2017   K 5.0  09/13/2017   CL 99 09/13/2017   CO2 25 09/13/2017   Lab Results  Component Value Date   HGBA1C 7.6 (A) 06/28/2018       Assessment & Plan:  HTN: is noted today.  Type 2 DM, with CVA: worse. Renal insuff: This limits rx options.    Patient Instructions  Your blood pressure is high today.  Please see your primary care provider soon, to have it rechecked I have sent a prescription to your pharmacy, to add "bromocriptine."  check your blood sugar once a day.  vary the time of day when you check, between before the 3 meals, and at bedtime.  also check if you have symptoms of your blood sugar being too high or too low.  please keep a record of the readings and bring it to your next appointment here (or you can bring the meter itself).  You can write it on any piece of paper.  please call us sooner if your blood sugar goes below 70, or if you have a lot of readings over 200.   Please come back for a follow-up appointment in 3 months.

## 2018-06-28 NOTE — Patient Instructions (Addendum)
Your blood pressure is high today.  Please see your primary care provider soon, to have it rechecked I have sent a prescription to your pharmacy, to add "bromocriptine."  check your blood sugar once a day.  vary the time of day when you check, between before the 3 meals, and at bedtime.  also check if you have symptoms of your blood sugar being too high or too low.  please keep a record of the readings and bring it to your next appointment here (or you can bring the meter itself).  You can write it on any piece of paper.  please call us sooner if your blood sugar goes below 70, or if you have a lot of readings over 200.   Please come back for a follow-up appointment in 3 months.

## 2018-07-03 ENCOUNTER — Other Ambulatory Visit: Payer: Self-pay

## 2018-07-03 ENCOUNTER — Encounter: Payer: Self-pay | Admitting: Podiatry

## 2018-07-03 ENCOUNTER — Ambulatory Visit (INDEPENDENT_AMBULATORY_CARE_PROVIDER_SITE_OTHER): Payer: Medicare Other | Admitting: Podiatry

## 2018-07-03 VITALS — Temp 97.3°F

## 2018-07-03 DIAGNOSIS — E1151 Type 2 diabetes mellitus with diabetic peripheral angiopathy without gangrene: Secondary | ICD-10-CM

## 2018-07-03 DIAGNOSIS — B351 Tinea unguium: Secondary | ICD-10-CM | POA: Diagnosis not present

## 2018-07-03 DIAGNOSIS — M79675 Pain in left toe(s): Secondary | ICD-10-CM | POA: Diagnosis not present

## 2018-07-03 DIAGNOSIS — M79674 Pain in right toe(s): Secondary | ICD-10-CM

## 2018-07-03 NOTE — Patient Instructions (Addendum)
Ingrown Toenail An ingrown toenail occurs when the corner or sides of a toenail grow into the surrounding skin. This causes discomfort and pain. The big toe is most commonly affected, but any of the toes can be affected. If an ingrown toenail is not treated, it can become infected. What are the causes? This condition may be caused by:  Wearing shoes that are too small or tight.  An injury, such as stubbing your toe or having your toe stepped on.  Improper cutting or care of your toenails.  Having nail or foot abnormalities that were present from birth (congenital abnormalities), such as having a nail that is too big for your toe. What increases the risk? The following factors may make you more likely to develop ingrown toenails:  Age. Nails tend to get thicker with age, so ingrown nails are more common among older people.  Cutting your toenails incorrectly, such as cutting them very short or cutting them unevenly. An ingrown toenail is more likely to get infected if you have:  Diabetes.  Blood flow (circulation) problems. What are the signs or symptoms? Symptoms of an ingrown toenail may include:  Pain, soreness, or tenderness.  Redness.  Swelling.  Hardening of the skin that surrounds the toenail. Signs that an ingrown toenail may be infected include:  Fluid or pus.  Symptoms that get worse instead of better. How is this diagnosed? An ingrown toenail may be diagnosed based on your medical history, your symptoms, and a physical exam. If you have fluid or blood coming from your toenail, a sample may be collected to test for the specific type of bacteria that is causing the infection. How is this treated? Treatment depends on how severe your ingrown toenail is. You may be able to care for your toenail at home.  If you have an infection, you may be prescribed antibiotic medicines.  If you have fluid or pus draining from your toenail, your health care provider may drain it.   If you have trouble walking, you may be given crutches to use.  If you have a severe or infected ingrown toenail, you may need a procedure to remove part or all of the nail. Follow these instructions at home: Foot care   Do not pick at your toenail or try to remove it yourself.  Soak your foot in warm, soapy water. Do this for 20 minutes, 3 times a day, or as often as told by your health care provider. This helps to keep your toe clean and keep your skin soft.  Wear shoes that fit well and are not too tight. Your health care provider may recommend that you wear open-toed shoes while you heal.  Trim your toenails regularly and carefully. Cut your toenails straight across to prevent injury to the skin at the corners of the toenail. Do not cut your nails in a curved shape.  Keep your feet clean and dry to help prevent infection. Medicines  Take over-the-counter and prescription medicines only as told by your health care provider.  If you were prescribed an antibiotic, take it as told by your health care provider. Do not stop taking the antibiotic even if you start to feel better. Activity  Return to your normal activities as told by your health care provider. Ask your health care provider what activities are safe for you.  Avoid activities that cause pain. General instructions  If your health care provider told you to use crutches to help you move around, use them  as instructed.  Keep all follow-up visits as told by your health care provider. This is important. Contact a health care provider if:  You have more redness, swelling, pain, or other symptoms that do not improve with treatment.  You have fluid, blood, or pus coming from your toenail. Get help right away if:  You have a red streak on your skin that starts at your foot and spreads up your leg.  You have a fever. Summary  An ingrown toenail occurs when the corner or sides of a toenail grow into the surrounding skin.  This causes discomfort and pain. The big toe is most commonly affected, but any of the toes can be affected.  If an ingrown toenail is not treated, it can become infected.  Fluid or pus draining from your toenail is a sign of infection. Your health care provider may need to drain it. You may be given antibiotics to treat the infection.  Trimming your toenails regularly and properly can help you prevent an ingrown toenail. This information is not intended to replace advice given to you by your health care provider. Make sure you discuss any questions you have with your health care provider. Document Released: 01/08/2000 Document Revised: 09/28/2016 Document Reviewed: 09/28/2016 Elsevier Interactive Patient Education  2019 [employer]  Diabetes Mellitus and [place] care is an important part of your health, especially when you have diabetes. Diabetes may cause you to have problems because of poor blood flow (circulation) to your feet and legs, which can cause your skin to:  Become thinner and drier.  Break more easily.  Heal more slowly.  Peel and crack. You may also have nerve damage (neuropathy) in your legs and feet, causing decreased feeling in them. This means that you may not notice minor injuries to your feet that could lead to more serious problems. Noticing and addressing any potential problems early is the best way to prevent future foot problems. How to care for your feet Foot hygiene  Wash your feet daily with warm water and mild soap. Do not use hot water. Then, pat your feet and the areas between your toes until they are completely dry. Do not soak your feet as this can dry your skin.  Trim your toenails straight across. Do not dig under them or around the cuticle. File the edges of your nails with an emery board or nail file.  Apply a moisturizing lotion or petroleum jelly to the skin on your feet and to dry, brittle toenails. Use lotion that does not contain  alcohol and is unscented. Do not apply lotion between your toes. Shoes and socks  Wear clean socks or stockings every day. Make sure they are not too tight. Do not wear knee-high stockings since they may decrease blood flow to your legs.  Wear shoes that fit properly and have enough cushioning. Always look in your shoes before you put them on to be sure there are no objects inside.  To break in new shoes, wear them for just a few hours a day. This prevents injuries on your feet. Wounds, scrapes, corns, and calluses  Check your feet daily for blisters, cuts, bruises, sores, and redness. If you cannot see the bottom of your feet, use a mirror or ask someone for help.  Do not cut corns or calluses or try to remove them with medicine.  If you find a minor scrape, cut, or break in the skin on your feet, keep it and the skin  around it clean and dry. You may clean these areas with mild soap and water. Do not clean the area with peroxide, alcohol, or iodine.  If you have a wound, scrape, corn, or callus on your foot, look at it several times a day to make sure it is healing and not infected. Check for: ? Redness, swelling, or pain. ? Fluid or blood. ? Warmth. ? Pus or a bad smell. General instructions  Do not cross your legs. This may decrease blood flow to your feet.  Do not use heating pads or hot water bottles on your feet. They may burn your skin. If you have lost feeling in your feet or legs, you may not know this is happening until it is too late.  Protect your feet from hot and cold by wearing shoes, such as at the beach or on hot pavement.  Schedule a complete foot exam at least once a year (annually) or more often if you have foot problems. If you have foot problems, report any cuts, sores, or bruises to your health care provider immediately. Contact a health care provider if:  You have a medical condition that increases your risk of infection and you have any cuts, sores, or bruises  on your feet.  You have an injury that is not healing.  You have redness on your legs or feet.  You feel burning or tingling in your legs or feet.  You have pain or cramps in your legs and feet.  Your legs or feet are numb.  Your feet always feel cold.  You have pain around a toenail. Get help right away if:  You have a wound, scrape, corn, or callus on your foot and: ? You have pain, swelling, or redness that gets worse. ? You have fluid or blood coming from the wound, scrape, corn, or callus. ? Your wound, scrape, corn, or callus feels warm to the touch. ? You have pus or a bad smell coming from the wound, scrape, corn, or callus. ? You have a fever. ? You have a red line going up your leg. Summary  Check your feet every day for cuts, sores, red spots, swelling, and blisters.  Moisturize feet and legs daily.  Wear shoes that fit properly and have enough cushioning.  If you have foot problems, report any cuts, sores, or bruises to your health care provider immediately.  Schedule a complete foot exam at least once a year (annually) or more often if you have foot problems. This information is not intended to replace advice given to you by your health care provider. Make sure you discuss any questions you have with your health care provider. Document Released: 01/08/2000 Document Revised: 02/22/2017 Document Reviewed: 02/12/2016 Elsevier Interactive Patient Education  2019 [employer].

## 2018-07-04 ENCOUNTER — Ambulatory Visit (HOSPITAL_COMMUNITY)
Admission: RE | Admit: 2018-07-04 | Discharge: 2018-07-04 | Disposition: A | Discharge status: Home / Self Care | Payer: Medicare Other | Source: Ambulatory Visit | Attending: Nephrology | Admitting: Nephrology

## 2018-07-04 ENCOUNTER — Other Ambulatory Visit: Payer: Self-pay

## 2018-07-04 VITALS — BP 164/65 | HR 63 | Temp 97.7°F | Resp 20

## 2018-07-04 DIAGNOSIS — N183 Chronic kidney disease, stage 3 unspecified: Secondary | ICD-10-CM

## 2018-07-04 DIAGNOSIS — D631 Anemia in chronic kidney disease: Secondary | ICD-10-CM | POA: Diagnosis not present

## 2018-07-04 LAB — IRON AND TIBC
Iron: 82 ug/dL (ref 28–170)
Saturation Ratios: 25 % (ref 10.4–31.8)
TIBC: 323 ug/dL (ref 250–450)
UIBC: 241 ug/dL

## 2018-07-04 LAB — POCT HEMOGLOBIN-HEMACUE: Hemoglobin: 10.2 g/dL — ABNORMAL LOW (ref 12.0–15.0)

## 2018-07-04 LAB — FERRITIN: Ferritin: 107 ng/mL (ref 11–307)

## 2018-07-04 MED ORDER — EPOETIN ALFA-EPBX 10000 UNIT/ML IJ SOLN
20000.0000 [IU] | INTRAMUSCULAR | Status: DC
  Administered 2018-07-04: 20000 [IU] via SUBCUTANEOUS
  Filled 2018-07-04: qty 2

## 2018-07-11 ENCOUNTER — Encounter: Payer: Self-pay | Admitting: Family

## 2018-07-11 ENCOUNTER — Other Ambulatory Visit: Payer: Medicare Other

## 2018-07-11 ENCOUNTER — Other Ambulatory Visit: Payer: Self-pay

## 2018-07-11 ENCOUNTER — Ambulatory Visit (INDEPENDENT_AMBULATORY_CARE_PROVIDER_SITE_OTHER): Payer: Medicare Other | Admitting: Family

## 2018-07-11 ENCOUNTER — Other Ambulatory Visit (INDEPENDENT_AMBULATORY_CARE_PROVIDER_SITE_OTHER): Payer: Medicare Other

## 2018-07-11 ENCOUNTER — Telehealth: Payer: Self-pay

## 2018-07-11 VITALS — BP 138/70 | HR 58 | Temp 97.9°F | Ht 62.0 in | Wt 143.0 lb

## 2018-07-11 DIAGNOSIS — E1122 Type 2 diabetes mellitus with diabetic chronic kidney disease: Secondary | ICD-10-CM

## 2018-07-11 DIAGNOSIS — E785 Hyperlipidemia, unspecified: Secondary | ICD-10-CM | POA: Diagnosis not present

## 2018-07-11 DIAGNOSIS — N183 Chronic kidney disease, stage 3 unspecified: Secondary | ICD-10-CM

## 2018-07-11 DIAGNOSIS — R899 Unspecified abnormal finding in specimens from other organs, systems and tissues: Secondary | ICD-10-CM

## 2018-07-11 DIAGNOSIS — I10 Essential (primary) hypertension: Secondary | ICD-10-CM

## 2018-07-11 LAB — COMPREHENSIVE METABOLIC PANEL
ALT: 12 U/L (ref 0–35)
AST: 13 U/L (ref 0–37)
Albumin: 4.1 g/dL (ref 3.5–5.2)
Alkaline Phosphatase: 71 U/L (ref 39–117)
BUN: 97 mg/dL (ref 6–23)
CO2: 28 mEq/L (ref 19–32)
Calcium: 9.2 mg/dL (ref 8.4–10.5)
Chloride: 95 mEq/L — ABNORMAL LOW (ref 96–112)
Creatinine, Ser: 2.28 mg/dL — ABNORMAL HIGH (ref 0.40–1.20)
GFR: 25.74 mL/min — ABNORMAL LOW (ref >60.00)
Glucose, Bld: 104 mg/dL — ABNORMAL HIGH (ref 70–99)
Potassium: 3.5 mEq/L (ref 3.5–5.1)
Sodium: 132 mEq/L — ABNORMAL LOW (ref 135–145)
Total Bilirubin: 0.8 mg/dL (ref 0.2–1.2)
Total Protein: 7.6 g/dL (ref 6.0–8.3)

## 2018-07-11 LAB — LIPID PANEL
Cholesterol: 155 mg/dL (ref 0–200)
HDL: 44.7 mg/dL (ref >39.00)
LDL Cholesterol: 80 mg/dL (ref 0–99)
NonHDL: 110.3
Total CHOL/HDL Ratio: 3
Triglycerides: 151 mg/dL — ABNORMAL HIGH (ref 0.0–149.0)
VLDL: 30.2 mg/dL (ref 0.0–40.0)

## 2018-07-11 LAB — VITAMIN D 25 HYDROXY (VIT D DEFICIENCY, FRACTURES): VITD: 33.21 ng/mL (ref 30.00–100.00)

## 2018-07-11 NOTE — Telephone Encounter (Signed)
CRITICAL VALUE STICKER  CRITICAL VALUE:BUN [id number]  RECEIVER (on-site recipient of call):Brandyn Thien,RN  DATE & TIME NOTIFIED: 06/17 14:57  MESSENGER (representative from lab):Si/Lab  MD NOTIFIED: Billey Gosling, MD  TIME OF NOTIFICATION:6/17 14:58  RESPONSE: pending, please advise in the absence of [place]

## 2018-07-11 NOTE — Progress Notes (Signed)
[name] is a 68 y.o. female with the following history as recorded in EpicCare:  Patient Active Problem List   Diagnosis Date Noted  . Vitamin D toxicity, accidental or unintentional, initial encounter 09/09/2017  . Ataxia 09/09/2017  . Type 2 diabetes mellitus with hyperglycemia ([place]) 09/09/2017  . Chronic kidney disease (CKD)   . Olecranon bursitis of right elbow 03/09/2017  . Thyroid nodule 06/29/2016  . Type 2 diabetes mellitus ([place]) 06/13/2016  . Vitamin D deficiency 01/12/2016  . Medicare annual wellness visit, initial 01/11/2016  . Lacunar infarct, acute ([place]) 03/23/2015  . Non compliance w medication regimen 01/16/2015  . Anemia 01/08/2015  . Stroke ([place]) 01/08/2015  . Hyperlipemia   . Carotid bruit present 12/13/2012  . Routine health maintenance 10/24/2010  . PERIPHERAL EDEMA 09/10/2009  . ELECTROCARDIOGRAM, ABNORMAL 04/11/2008  . HEART MURMUR, SYSTOLIC 79/39/0300  . SEBACEOUS CYST, NECK 12/13/2006  . Hyperthyroidism 09/27/2006  . Secondary diabetes with peripheral vascular disease ([place]) 09/27/2006  . Hyperlipidemia LDL goal <70 09/27/2006  . TOBACCO ABUSE 09/27/2006  . Essential hypertension 09/27/2006    Current Outpatient Medications  Medication Sig Dispense Refill  . amLODipine (NORVASC) 10 MG tablet Take 1 tablet (10 mg total) by mouth daily. 90 tablet 3  . aspirin 325 MG tablet Take 325 mg by mouth daily.    [name] atorvastatin (LIPITOR) 80 MG tablet TAKE 1 TABLET DAILY. FOLLOW-UP APPT IS DUE NEED LIPIDS CHECK MUST SEE PROVIDER FOR FUTURE REFILLS 90 tablet 0  . bromocriptine (PARLODEL) 2.5 MG tablet 1/4 tab daily 8 tablet 11  . calcitRIOL (ROCALTROL) 0.25 MCG capsule 1 (ONE) CAPSULE THREE TIMES A WEEK    . cloNIDine (CATAPRES) 0.2 MG tablet Take 1 tablet (0.2 mg total) by mouth 3 (three) times daily. 90 tablet 0  . furosemide (LASIX) 40 MG tablet Take 1 tablet (40 mg total) by mouth See admin instructions. Take 1 tablet daily, can take another pill if  ankles are swelling -Hold for the next 3 days and resume on 09/13/2017 30 tablet   . glucose blood (ONETOUCH VERIO) test strip USE AS INSTRUCTED (TESTS 2 TIMES A DAY) 100 each 5  . metoprolol succinate (TOPROL-XL) 100 MG 24 hr tablet TAKE 1 TABLET BY MOUTH DAILY FOR HIGH BLOOD PRESSURE 90 tablet 1  . minoxidil (LONITEN) 2.5 MG tablet Take 2.5 mg by mouth 2 (two) times daily.    . pantoprazole (PROTONIX) 40 MG tablet TAKE 1 TABLET BY MOUTH EVERY DAY 90 tablet 1  . repaglinide (PRANDIN) 2 MG tablet Take 1 tablet (2 mg total) by mouth 3 (three) times daily before meals. 90 tablet 11  . telmisartan-hydrochlorothiazide (MICARDIS HCT) 80-25 MG tablet TAKE 1 TABLET BY MOUTH EVERY DAY 90 tablet 1  . vitamin B-12 (CYANOCOBALAMIN) 100 MCG tablet TAKE 1 TABLET BY MOUTH AT BEDTIME 90 tablet 1   No current facility-administered medications for this visit.     Allergies: Patient has no known allergies.  Past Medical History:  Diagnosis Date  . Acute kidney injury ([place]) 01/08/2015  . Chronic back pain   . Cough   . Diabetes mellitus, type 2 ([place])   . Diverticulosis   . Fibroid    patient thinks this was the reason for her hysterectomy  . GERD (gastroesophageal reflux disease)   . History of sebaceous cyst   . Hyperlipemia   . Hyperplastic colon polyp   . Hypertension   . Hyperthyroidism   . Lumbar radiculopathy   . Shortness of  breath 09/13/2013  . Stroke ([place])   . Tobacco abuse     Past Surgical History:  Procedure Laterality Date  . ABDOMINAL HYSTERECTOMY     --?ovaries remain  . COLONOSCOPY W/ BIOPSIES    . RETINOPATHY SURGERY Bilateral 2013   Dr. Zigmund Daniel    Family History  Problem Relation Age of Onset  . Hypertension Mother   . Sleep apnea Mother   . Diabetes Mother   . Hyperlipidemia Mother   . Lung cancer Father        smoker  . Hypertension Sister   . Diabetes Sister   . Hypertension Brother   . Hypertension Sister   . Diabetes Brother   . Hypertension Brother   .  Breast cancer Neg Hx   . Colon cancer Neg Hx   . Esophageal cancer Neg Hx   . Pancreatic cancer Neg Hx   . Liver disease Neg Hx     Social History   Tobacco Use  . Smoking status: Former Smoker    Packs/day: 0.50    Years: 35.00    Pack years: 17.50    Types: Cigarettes    Quit date: 07/07/2014    Years since quitting: 4.0  . Smokeless tobacco: Never Used  Substance Use Topics  . Alcohol use: No    Subjective:  Patient presents today to transfer care from another provider who has recently left our office; in baseline state of health today; majority of her healthcare needs are managed by specialists; CKD Stage 3- sees her nephrologist every 3 months; Type 2 diabetes- under care of endocrinology; Hypertension/ hyperlipidemia- medications are managed through this office; Is due for colonoscopy later this summer- notes that her GI has recently contacted her and she will be having a Cologuard instead; Scheduled for mammogram next week.   Objective:  Vitals:   07/11/18 1224  BP: 138/70  Pulse: (!) 58  Temp: 97.9 F (36.6 C)  TempSrc: Oral  SpO2: 98%  Weight: 143 lb (64.9 kg)  Height: 5' 2" (1.575 m)    General: Well developed, well nourished, in no acute distress  Skin : Warm and dry.  Head: Normocephalic and atraumatic  Eyes: Sclera and conjunctiva clear; pupils round and reactive to light; extraocular movements intact  Ears: External normal; canals clear; tympanic membranes normal  Oropharynx: Pink, supple. No suspicious lesions  Neck: Supple without thyromegaly, adenopathy  Lungs: Respirations unlabored; clear to auscultation bilaterally without wheeze, rales, rhonchi  CVS exam: normal rate and regular rhythm.  Neurologic: Alert and oriented; speech intact; face symmetrical; moves all extremities well; CNII-XII intact without focal deficit  Assessment:  1. Hyperlipidemia LDL goal <70   2. Abnormal laboratory test   3. Type 2 diabetes mellitus with stage 3 chronic  kidney disease, without long-term current use of insulin ([facility])   4. Stage 3 chronic kidney disease ([place])   5. Essential hypertension     Plan:  1. Check CMP, lipid panel today; 3. Continue with endocrinologist as scheduled; 4. Continue with nephrologist as scheduled; 5. Stable; she will let our office know when she needs refills.   No follow-ups on file.  Orders Placed This Encounter  Procedures  . Lipid panel    Standing Status:   Future    Number of Occurrences:   1    Standing Expiration Date:   07/11/2019  . Vitamin D (25 hydroxy)    Standing Status:   Future    Number of Occurrences:   1  Standing Expiration Date:   07/11/2019  . Comp Met (CMET)    Standing Status:   Future    Number of Occurrences:   1    Standing Expiration Date:   07/11/2019    Requested Prescriptions    No prescriptions requested or ordered in this encounter

## 2018-07-11 NOTE — Telephone Encounter (Signed)
Kidney function slightly worse than last time, but overall stable.  She is following with nephrology. No acute action needed.

## 2018-07-12 NOTE — Telephone Encounter (Signed)
Advised patient of dr burns note/instructions, patient states she is following up with nephrology---routing to [place], fyi.Maria KitchenMarland KitchenMarland [name]

## 2018-07-15 NOTE — Progress Notes (Signed)
Subjective: [name] is a 68 y.o. y.o. female who presents for preventative diabetic foot care today with h/o PAD for painful, elongated mycotic toenails b/l feet.   She voices no new pedal concerns on today's visit.  Maria Salvage, FNP is her PCP.    Current Outpatient Medications:  .  amLODipine (NORVASC) 10 MG tablet, Take 1 tablet (10 mg total) by mouth daily., Disp: 90 tablet, Rfl: 3 .  aspirin 325 MG tablet, Take 325 mg by mouth daily., Disp: , Rfl:  .  atorvastatin (LIPITOR) 80 MG tablet, TAKE 1 TABLET DAILY. FOLLOW-UP APPT IS DUE NEED LIPIDS CHECK MUST SEE PROVIDER FOR FUTURE REFILLS, Disp: 90 tablet, Rfl: 0 .  bromocriptine (PARLODEL) 2.5 MG tablet, 1/4 tab daily, Disp: 8 tablet, Rfl: 11 .  calcitRIOL (ROCALTROL) 0.25 MCG capsule, 1 (ONE) CAPSULE THREE TIMES A WEEK, Disp: , Rfl:  .  cloNIDine (CATAPRES) 0.2 MG tablet, Take 1 tablet (0.2 mg total) by mouth 3 (three) times daily., Disp: 90 tablet, Rfl: 0 .  furosemide (LASIX) 40 MG tablet, Take 1 tablet (40 mg total) by mouth See admin instructions. Take 1 tablet daily, can take another pill if ankles are swelling -Hold for the next 3 days and resume on 09/13/2017, Disp: 30 tablet, Rfl:  .  glucose blood (ONETOUCH VERIO) test strip, USE AS INSTRUCTED (TESTS 2 TIMES A DAY), Disp: 100 each, Rfl: 5 .  metoprolol succinate (TOPROL-XL) 100 MG 24 hr tablet, TAKE 1 TABLET BY MOUTH DAILY FOR HIGH BLOOD PRESSURE, Disp: 90 tablet, Rfl: 1 .  minoxidil (LONITEN) 2.5 MG tablet, Take 2.5 mg by mouth 2 (two) times daily., Disp: , Rfl:  .  pantoprazole (PROTONIX) 40 MG tablet, TAKE 1 TABLET BY MOUTH EVERY DAY, Disp: 90 tablet, Rfl: 1 .  repaglinide (PRANDIN) 2 MG tablet, Take 1 tablet (2 mg total) by mouth 3 (three) times daily before meals., Disp: 90 tablet, Rfl: 11 .  telmisartan-hydrochlorothiazide (MICARDIS HCT) 80-25 MG tablet, TAKE 1 TABLET BY MOUTH EVERY DAY, Disp: 90 tablet, Rfl: 1 .  vitamin B-12 (CYANOCOBALAMIN) 100 MCG  tablet, TAKE 1 TABLET BY MOUTH AT BEDTIME, Disp: 90 tablet, Rfl: 1  No Known Allergies  Objective: Vitals:   07/03/18 1629  Temp: (!) 97.3 F (36.3 C)    Vascular Examination: Capillary refill time <3 seconds x 10 digits  Dorsalis pedis pulses 1/4 b/l.  Posterior tibial pulses absent b/l.  No digital hair x 10 digits.  Skin temperature WNL b/l.  Dermatological Examination: Skin thin, shiny and atrophic b/l.  Toenails 1-5 b/l discolored, thick, dystrophic with subungual debris and pain with palpation to nailbeds due to thickness of nails.  Musculoskeletal: Muscle strength 5/5 to all LE muscle groups.  Neurological: Sensation intact with 10 gram monofilament.  Vibratory sensation intact b/l.  Assessment: 1. Painful onychomycosis toenails 1-5 b/l 2. NIDDM with Peripheral arterial disease  Plan: 1. Continue diabetic foot care principles. Literature dispensed. 2. Toenails 1-5 b/l were debrided in length and girth without iatrogenic bleeding. 3. Patient to continue soft, supportive shoe gear daily. 4. Patient to report any pedal injuries to medical professional immediately. 5. Follow up 3 months.  6. Patient/POA to call should there be a concern in the interim.

## 2018-07-16 ENCOUNTER — Ambulatory Visit
Admission: RE | Admit: 2018-07-16 | Discharge: 2018-07-16 | Disposition: A | Discharge status: Home / Self Care | Payer: Medicare Other | Source: Ambulatory Visit | Attending: Nurse Practitioner | Admitting: Nurse Practitioner

## 2018-07-16 ENCOUNTER — Other Ambulatory Visit: Payer: Self-pay

## 2018-07-16 DIAGNOSIS — Z1231 Encounter for screening mammogram for malignant neoplasm of breast: Secondary | ICD-10-CM | POA: Diagnosis not present

## 2018-07-24 ENCOUNTER — Other Ambulatory Visit: Payer: Self-pay | Admitting: *Deleted

## 2018-07-24 MED ORDER — TELMISARTAN-HCTZ 80-25 MG PO TABS: 1.0000 | ORAL_TABLET | Freq: Every day | ORAL | 1 refills | Status: DC

## 2018-07-24 MED ORDER — PANTOPRAZOLE SODIUM 40 MG PO TBEC: 40.0000 mg | DELAYED_RELEASE_TABLET | Freq: Every day | ORAL | 0 refills | Status: DC

## 2018-08-01 ENCOUNTER — Other Ambulatory Visit: Payer: Self-pay

## 2018-08-01 ENCOUNTER — Encounter (HOSPITAL_COMMUNITY)
Admission: RE | Admit: 2018-08-01 | Discharge: 2018-08-01 | Disposition: A | Discharge status: Home / Self Care | Payer: Medicare Other | Source: Ambulatory Visit | Attending: Nephrology | Admitting: Nephrology

## 2018-08-01 VITALS — BP 165/73 | HR 66 | Temp 97.4°F | Resp 18

## 2018-08-01 DIAGNOSIS — N183 Chronic kidney disease, stage 3 unspecified: Secondary | ICD-10-CM

## 2018-08-01 DIAGNOSIS — D631 Anemia in chronic kidney disease: Secondary | ICD-10-CM | POA: Insufficient documentation

## 2018-08-01 LAB — POCT HEMOGLOBIN-HEMACUE: Hemoglobin: 10.3 g/dL — ABNORMAL LOW (ref 12.0–15.0)

## 2018-08-01 LAB — IRON AND TIBC
Iron: 69 ug/dL (ref 28–170)
Saturation Ratios: 21 % (ref 10.4–31.8)
TIBC: 329 ug/dL (ref 250–450)
UIBC: 260 ug/dL

## 2018-08-01 LAB — FERRITIN: Ferritin: 61 ng/mL (ref 11–307)

## 2018-08-01 MED ORDER — EPOETIN ALFA-EPBX 10000 UNIT/ML IJ SOLN
20000.0000 [IU] | INTRAMUSCULAR | Status: DC
  Administered 2018-08-01: 20000 [IU] via SUBCUTANEOUS
  Filled 2018-08-01: qty 2

## 2018-08-17 ENCOUNTER — Ambulatory Visit (INDEPENDENT_AMBULATORY_CARE_PROVIDER_SITE_OTHER): Payer: Medicare Other | Admitting: *Deleted

## 2018-08-17 ENCOUNTER — Other Ambulatory Visit: Payer: Self-pay | Admitting: *Deleted

## 2018-08-17 DIAGNOSIS — I10 Essential (primary) hypertension: Secondary | ICD-10-CM

## 2018-08-17 DIAGNOSIS — Z0000 Encounter for general adult medical examination without abnormal findings: Secondary | ICD-10-CM | POA: Diagnosis not present

## 2018-08-17 MED ORDER — METOPROLOL SUCCINATE ER 100 MG PO TB24: 100.0000 mg | ORAL_TABLET | Freq: Every day | ORAL | 1 refills | Status: DC

## 2018-08-17 MED ORDER — VITAMIN B12 100 MCG PO TABS: 100.0000 ug | ORAL_TABLET | Freq: Every day | ORAL | 1 refills | Status: DC

## 2018-08-17 NOTE — Progress Notes (Addendum)
Subjective:   [name] is a 68 y.o. female who presents for Medicare Annual (Subsequent) preventive examination. I connected with patient by a telephone and verified that I am speaking with the correct person using two identifiers. Patient stated full name and DOB. Patient gave permission to continue with telephonic visit. Patient's location was at home and Nurse's location was at [place] office.   Review of Systems:   Cardiac Risk Factors include: advanced age (>27men, >28 women);diabetes mellitus;dyslipidemia;hypertension Sleep patterns: feels rested on waking, gets up 0-1 times nightly to void and sleeps 6-7 hours nightly.   Home Safety/Smoke Alarms: Feels safe in home. Smoke alarms in place.  Living environment; residence and Firearm Safety: 1-story house/ trailer. Lives with son, no needs for DME, good support system Seat Belt Safety/Bike Helmet: Wears seat belt.      Objective:     Vitals: There were no vitals taken for this visit.  There is no height or weight on file to calculate BMI.  Advanced Directives 08/17/2018 09/09/2017 09/08/2017 07/07/2017 02/24/2016 03/07/2015 01/08/2015  Does Patient Have a Medical Advance Directive? No - No No No No No  Does patient want to make changes to medical advance directive? Yes (ED - Information included in AVS) - - - - - -  Would patient like information on creating a medical advance directive? - Yes (Inpatient - patient requests chaplain consult to create a medical advance directive) - Yes (ED - Information included in AVS) No - Patient declined No - patient declined information No - patient declined information    Tobacco Social History   Tobacco Use  Smoking Status Former Smoker  . Packs/day: 0.50  . Years: 35.00  . Pack years: 17.50  . Types: Cigarettes  . Quit date: 07/07/2014  . Years since quitting: 4.1  Smokeless Tobacco Never Used     Counseling given: Not Answered  Past Medical History:  Diagnosis Date  . Acute  kidney injury ([place]) 01/08/2015  . Chronic back pain   . Cough   . Diabetes mellitus, type 2 ([place])   . Diverticulosis   . Fibroid    patient thinks this was the reason for her hysterectomy  . GERD (gastroesophageal reflux disease)   . History of sebaceous cyst   . Hyperlipemia   . Hyperplastic colon polyp   . Hypertension   . Hyperthyroidism   . Lumbar radiculopathy   . Shortness of breath 09/13/2013  . Stroke ([place])    x4   . Tobacco abuse    Past Surgical History:  Procedure Laterality Date  . ABDOMINAL HYSTERECTOMY     --?ovaries remain  . COLONOSCOPY W/ BIOPSIES    . RETINOPATHY SURGERY Bilateral 2013   Dr. Zigmund Daniel   Family History  Problem Relation Age of Onset  . Hypertension Mother   . Sleep apnea Mother   . Diabetes Mother   . Hyperlipidemia Mother   . Lung cancer Father        smoker  . Hypertension Sister   . Diabetes Sister   . Hypertension Brother   . Hypertension Sister   . Diabetes Brother   . Hypertension Brother   . Breast cancer Neg Hx   . Colon cancer Neg Hx   . Esophageal cancer Neg Hx   . Pancreatic cancer Neg Hx   . Liver disease Neg Hx    Social History   Socioeconomic History  . Marital status: Divorced    Spouse name: Not on file  .  Number of children: 1  . Years of education: 36  . Highest education level: Not on file  Occupational History  . Occupation: Insurance account manager: [employer]    Comment: Retired  Scientific laboratory technician  . Financial resource strain: Not hard at all  . Food insecurity    Worry: Never true    Inability: Never true  . Transportation needs    Medical: No    Non-medical: No  Tobacco Use  . Smoking status: Former Smoker    Packs/day: 0.50    Years: 35.00    Pack years: 17.50    Types: Cigarettes    Quit date: 07/07/2014    Years since quitting: 4.1  . Smokeless tobacco: Never Used  Substance and Sexual Activity  . Alcohol use: No  . Drug use: No  . Sexual activity: Never    Partners: Male     Birth control/protection: Surgical    Comment: TAH--?ovaries remain  Lifestyle  . Physical activity    Days per week: 6 days    Minutes per session: 40 min  . Stress: Not at all  Relationships  . Social connections    Talks on phone: More than three times a week    Gets together: More than three times a week    Attends religious service: More than 4 times per year    Active member of club or organization: Yes    Attends meetings of clubs or organizations: More than 4 times per year    Relationship status: Divorced  Other Topics Concern  . Not on file  Social History Narrative   Divorced 1 son    Retired Museum/gallery curator for [employer]   Former smoker no alcohol caffeine or drug use report denies abuse and feels safe at home.    Outpatient Encounter Medications as of 08/17/2018  Medication Sig  . amLODipine (NORVASC) 10 MG tablet Take 1 tablet (10 mg total) by mouth daily.  [name] aspirin 325 MG tablet Take 325 mg by mouth daily.  [name] atorvastatin (LIPITOR) 80 MG tablet TAKE 1 TABLET DAILY. FOLLOW-UP APPT IS DUE NEED LIPIDS CHECK MUST SEE PROVIDER FOR FUTURE REFILLS  . bromocriptine (PARLODEL) 2.5 MG tablet 1/4 tab daily  . calcitRIOL (ROCALTROL) 0.25 MCG capsule 1 (ONE) CAPSULE THREE TIMES A WEEK  . cloNIDine (CATAPRES) 0.2 MG tablet Take 1 tablet (0.2 mg total) by mouth 3 (three) times daily.  . furosemide (LASIX) 40 MG tablet Take 1 tablet (40 mg total) by mouth See admin instructions. Take 1 tablet daily, can take another pill if ankles are swelling -Hold for the next 3 days and resume on 09/13/2017  . glucose blood (ONETOUCH VERIO) test strip USE AS INSTRUCTED (TESTS 2 TIMES A DAY)  . metoprolol succinate (TOPROL-XL) 100 MG 24 hr tablet Take 1 tablet (100 mg total) by mouth daily. for high blood pressure  . minoxidil (LONITEN) 2.5 MG tablet Take 2.5 mg by mouth 2 (two) times daily.  . pantoprazole (PROTONIX) 40 MG tablet Take 1 tablet (40 mg total) by mouth daily.  .  repaglinide (PRANDIN) 2 MG tablet Take 1 tablet (2 mg total) by mouth 3 (three) times daily before meals.  [name] telmisartan-hydrochlorothiazide (MICARDIS HCT) 80-25 MG tablet Take 1 tablet by mouth daily.  . vitamin B-12 (CYANOCOBALAMIN) 100 MCG tablet Take 1 tablet (100 mcg total) by mouth at bedtime.  . [DISCONTINUED] metoprolol succinate (TOPROL-XL) 100 MG 24 hr tablet TAKE 1 TABLET BY MOUTH DAILY  FOR HIGH BLOOD PRESSURE  . [DISCONTINUED] vitamin B-12 (CYANOCOBALAMIN) 100 MCG tablet TAKE 1 TABLET BY MOUTH AT BEDTIME   No facility-administered encounter medications on file as of 08/17/2018.     Activities of Daily Living In your present state of health, do you have any difficulty performing the following activities: 08/17/2018 09/09/2017  Hearing? N N  Vision? N N  Difficulty concentrating or making decisions? N N  Walking or climbing stairs? N Y  Dressing or bathing? N N  Doing errands, shopping? N N  Preparing Food and eating ? N -  Using the Toilet? N -  In the past six months, have you accidently leaked urine? N -  Do you have problems with loss of bowel control? N -  Managing your Medications? N -  Managing your Finances? N -  Housekeeping or managing your Housekeeping? N -  Some recent data might be hidden    Patient Care Team: Marrian Salvage, [place] as PCP - General (Internal Medicine) Hayden Pedro, MD (Ophthalmology)    Assessment:   This is a routine wellness examination for [place]. Physical assessment deferred to PCP.  Exercise Activities and Dietary recommendations Current Exercise Habits: Home exercise routine, Type of exercise: walking, Time (Minutes): 45, Frequency (Times/Week): 6, Weekly Exercise (Minutes/Week): 270, Intensity: Mild, Exercise limited by: None identified  Diet (meal preparation, eat out, water intake, caffeinated beverages, dairy products, fruits and vegetables): in general, a "healthy" diet  , well balanced   Reviewed heart healthy and  diabetic diet. Encouraged patient to increase daily water and healthy fluid intake.  Goals    . Patient Stated     I want to exercise and eat healthy for my diabetes and high blood pressure.       Fall Risk Fall Risk  08/17/2018 07/07/2017 02/24/2017 01/11/2016 03/23/2015  Falls in the past year? 0 No No No No  Number falls in past yr: 0 - - - -    Depression Screen PHQ 2/9 Scores 08/17/2018 07/07/2017 02/24/2017 01/11/2016  PHQ - 2 Score 0 0 0 0     Cognitive Function       Ad8 score reviewed for issues:  Issues making decisions: no  Less interest in hobbies / activities: no  Repeats questions, stories (family complaining): no  Trouble using ordinary gadgets (microwave, computer, phone):no  Forgets the month or year: no  Mismanaging finances: no  Remembering appts: no  Daily problems with thinking and/or memory: no Ad8 score is= 0  Immunization History  Administered Date(s) Administered  . Influenza Whole 10/25/2011  . Influenza, High Dose Seasonal PF 01/11/2016, 12/05/2016, 12/15/2017  . Influenza,inj,Quad PF,6+ Mos 12/13/2012, 10/07/2014  . Pneumococcal Conjugate-13 08/28/2015  . Pneumococcal Polysaccharide-23 12/13/2011, 07/07/2017  . Td 09/09/2008  . Zoster 12/13/2012   Screening Tests Health Maintenance  Topic Date Due  . INFLUENZA VACCINE  08/25/2018  . TETANUS/TDAP  09/10/2018  . COLONOSCOPY  09/12/2018  . HEMOGLOBIN A1C  12/28/2018  . OPHTHALMOLOGY EXAM  02/02/2019  . FOOT EXAM  03/08/2019  . MAMMOGRAM  07/15/2020  . DEXA SCAN  Completed  . Hepatitis C Screening  Completed  . PNA vac Low Risk Adult  Completed       Plan:     Reviewed health maintenance screenings with patient today and relevant education, vaccines, and/or referrals were provided.   Continue to eat heart healthy diet (full of fruits, vegetables, whole grains, lean protein, water--limit salt, fat, and sugar intake) and increase physical  activity as tolerated.  Continue doing  brain stimulating activities (puzzles, reading, adult coloring books, staying active) to keep memory sharp.   I have personally reviewed and noted the following in the patient's chart:   . Medical and social history . Use of alcohol, tobacco or illicit drugs  . Current medications and supplements . Functional ability and status . Nutritional status . Physical activity . Advanced directives . List of other physicians . Screenings to include cognitive, depression, and falls . Referrals and appointments  In addition, I have reviewed and discussed with patient certain preventive protocols, quality metrics, and best practice recommendations. A written personalized care plan for preventive services as well as general preventive health recommendations were provided to patient.     Michiel Cowboy, RN  08/17/2018   Medical screening examination/treatment/procedure(s) were performed by non-physician practitioner and as supervising provider I was immediately available for consultation/collaboration.  I agree with above. Marrian Salvage, FNP

## 2018-08-17 NOTE — Patient Instructions (Signed)
Continue doing brain stimulating activities (puzzles, reading, adult coloring books, staying active) to keep memory sharp.   Continue to eat heart healthy diet (full of fruits, vegetables, whole grains, lean protein, water--limit salt, fat, and sugar intake) and increase physical activity as tolerated.   Ms. [name] , Thank you for taking time to come for your Medicare Wellness Visit. I appreciate your ongoing commitment to your health goals. Please review the following plan we discussed and let me know if I can assist you in the future.   These are the goals we discussed: Goals    . Patient Stated     I want to exercise and eat healthy for my diabetes and high blood pressure. Continue good recipes at home.       This is a list of the screening recommended for you and due dates:  Health Maintenance  Topic Date Due  . Flu Shot  08/25/2018  . Tetanus Vaccine  09/10/2018  . Colon Cancer Screening  09/12/2018  . Hemoglobin A1C  12/28/2018  . Eye exam for diabetics  02/02/2019  . Complete foot exam   03/08/2019  . Mammogram  07/15/2020  . DEXA scan (bone density measurement)  Completed  .  Hepatitis C: One time screening is recommended by Center for Disease Control  (CDC) for  adults born from [birth year] through 1965.   Completed  . Pneumonia vaccines  Completed    Preventive Care 72 Years and Older, Female Preventive care refers to lifestyle choices and visits with your health care provider that can promote health and wellness. This includes:  A yearly physical exam. This is also called an annual well check.  Regular dental and eye exams.  Immunizations.  Screening for certain conditions.  Healthy lifestyle choices, such as diet and exercise. What can I expect for my preventive care visit? Physical exam Your health care provider will check:  Height and weight. These may be used to calculate body mass index (BMI), which is a measurement that tells if you are at a healthy weight.   Heart rate and blood pressure.  Your skin for abnormal spots. Counseling Your health care provider may ask you questions about:  Alcohol, tobacco, and drug use.  Emotional well-being.  Home and relationship well-being.  Sexual activity.  Eating habits.  History of falls.  Memory and ability to understand (cognition).  Work and work Statistician.  Pregnancy and menstrual history. What immunizations do I need?  Influenza (flu) vaccine  This is recommended every year. Tetanus, diphtheria, and pertussis (Tdap) vaccine  You may need a Td booster every 10 years. Varicella (chickenpox) vaccine  You may need this vaccine if you have not already been vaccinated. Zoster (shingles) vaccine  You may need this after age 17. Pneumococcal conjugate (PCV13) vaccine  One dose is recommended after age 26. Pneumococcal polysaccharide (PPSV23) vaccine  One dose is recommended after age 47. Measles, mumps, and rubella (MMR) vaccine  You may need at least one dose of MMR if you were born in 1957 or later. You may also need a second dose. Meningococcal conjugate (MenACWY) vaccine  You may need this if you have certain conditions. Hepatitis A vaccine  You may need this if you have certain conditions or if you travel or work in places where you may be exposed to hepatitis A. Hepatitis B vaccine  You may need this if you have certain conditions or if you travel or work in places where you may be exposed to hepatitis  B. Haemophilus influenzae type b (Hib) vaccine  You may need this if you have certain conditions. You may receive vaccines as individual doses or as more than one vaccine together in one shot (combination vaccines). Talk with your health care provider about the risks and benefits of combination vaccines. What tests do I need? Blood tests  Lipid and cholesterol levels. These may be checked every 5 years, or more frequently depending on your overall health.   Hepatitis C test.  Hepatitis B test. Screening  Lung cancer screening. You may have this screening every year starting at age 1 if you have a 30-pack-year history of smoking and currently smoke or have quit within the past 15 years.  Colorectal cancer screening. All adults should have this screening starting at age 2 and continuing until age 56. Your health care provider may recommend screening at age 53 if you are at increased risk. You will have tests every 1-10 years, depending on your results and the type of screening test.  Diabetes screening. This is done by checking your blood sugar (glucose) after you have not eaten for a while (fasting). You may have this done every 1-3 years.  Mammogram. This may be done every 1-2 years. Talk with your health care provider about how often you should have regular mammograms.  BRCA-related cancer screening. This may be done if you have a family history of breast, ovarian, tubal, or peritoneal cancers. Other tests  Sexually transmitted disease (STD) testing.  Bone density scan. This is done to screen for osteoporosis. You may have this done starting at age 73. Follow these instructions at home: Eating and drinking  Eat a diet that includes fresh fruits and vegetables, whole grains, lean protein, and low-fat dairy products. Limit your intake of foods with high amounts of sugar, saturated fats, and salt.  Take vitamin and mineral supplements as recommended by your health care provider.  Do not drink alcohol if your health care provider tells you not to drink.  If you drink alcohol: ? Limit how much you have to 0-1 drink a day. ? Be aware of how much alcohol is in your drink. In the [place] one drink equals one 12 oz bottle of beer (355 mL), one 5 oz glass of wine (148 mL), or one 1 oz glass of hard liquor (44 mL). Lifestyle  Take daily care of your teeth and gums.  Stay active. Exercise for at least 30 minutes on 5 or more days each week.   Do not use any products that contain nicotine or tobacco, such as cigarettes, e-cigarettes, and chewing tobacco. If you need help quitting, ask your health care provider.  If you are sexually active, practice safe sex. Use a condom or other form of protection in order to prevent STIs (sexually transmitted infections).  Talk with your health care provider about taking a low-dose aspirin or statin. What's next?  Go to your health care provider once a year for a well check visit.  Ask your health care provider how often you should have your eyes and teeth checked.  Stay up to date on all vaccines. This information is not intended to replace advice given to you by your health care provider. Make sure you discuss any questions you have with your health care provider. Document Released: 02/06/2015 Document Revised: 01/04/2018 Document Reviewed: 01/04/2018 Elsevier Patient Education  2020 [employer].

## 2018-08-19 ENCOUNTER — Encounter: Payer: Self-pay | Admitting: Family

## 2018-08-19 DIAGNOSIS — Z1211 Encounter for screening for malignant neoplasm of colon: Secondary | ICD-10-CM | POA: Diagnosis not present

## 2018-08-29 ENCOUNTER — Ambulatory Visit (HOSPITAL_COMMUNITY)
Admission: RE | Admit: 2018-08-29 | Discharge: 2018-08-29 | Disposition: A | Discharge status: Home / Self Care | Payer: Medicare Other | Source: Ambulatory Visit | Attending: Nephrology | Admitting: Nephrology

## 2018-08-29 ENCOUNTER — Other Ambulatory Visit: Payer: Self-pay

## 2018-08-29 VITALS — BP 110/54 | HR 59 | Temp 97.3°F | Resp 18

## 2018-08-29 DIAGNOSIS — N183 Chronic kidney disease, stage 3 unspecified: Secondary | ICD-10-CM

## 2018-08-29 DIAGNOSIS — D631 Anemia in chronic kidney disease: Secondary | ICD-10-CM | POA: Diagnosis not present

## 2018-08-29 LAB — IRON AND TIBC
Iron: 60 ug/dL (ref 28–170)
Saturation Ratios: 18 % (ref 10.4–31.8)
TIBC: 337 ug/dL (ref 250–450)
UIBC: 277 ug/dL

## 2018-08-29 LAB — POCT HEMOGLOBIN-HEMACUE: Hemoglobin: 9.7 g/dL — ABNORMAL LOW (ref 12.0–15.0)

## 2018-08-29 LAB — FERRITIN: Ferritin: 65 ng/mL (ref 11–307)

## 2018-08-29 MED ORDER — EPOETIN ALFA-EPBX 10000 UNIT/ML IJ SOLN
20000.0000 [IU] | INTRAMUSCULAR | Status: DC
  Administered 2018-08-29: 20000 [IU] via SUBCUTANEOUS
  Filled 2018-08-29: qty 2

## 2018-09-03 ENCOUNTER — Encounter: Payer: Self-pay | Admitting: Internal Medicine

## 2018-09-10 ENCOUNTER — Other Ambulatory Visit: Payer: Self-pay | Admitting: Internal Medicine

## 2018-09-26 ENCOUNTER — Encounter (HOSPITAL_COMMUNITY)
Admission: RE | Admit: 2018-09-26 | Discharge: 2018-09-26 | Disposition: A | Discharge status: Home / Self Care | Payer: Medicare Other | Source: Ambulatory Visit | Attending: Nephrology | Admitting: Nephrology

## 2018-09-26 ENCOUNTER — Other Ambulatory Visit: Payer: Self-pay

## 2018-09-26 VITALS — BP 126/55 | HR 60 | Temp 97.0°F | Resp 18

## 2018-09-26 DIAGNOSIS — N183 Chronic kidney disease, stage 3 unspecified: Secondary | ICD-10-CM

## 2018-09-26 DIAGNOSIS — D631 Anemia in chronic kidney disease: Secondary | ICD-10-CM | POA: Insufficient documentation

## 2018-09-26 LAB — FERRITIN: Ferritin: 68 ng/mL (ref 11–307)

## 2018-09-26 LAB — IRON AND TIBC
Iron: 87 ug/dL (ref 28–170)
Saturation Ratios: 27 % (ref 10.4–31.8)
TIBC: 323 ug/dL (ref 250–450)
UIBC: 236 ug/dL

## 2018-09-26 MED ORDER — EPOETIN ALFA-EPBX 10000 UNIT/ML IJ SOLN
30000.0000 [IU] | INTRAMUSCULAR | Status: DC
  Administered 2018-09-26: 30000 [IU] via SUBCUTANEOUS
  Filled 2018-09-26: qty 3

## 2018-09-27 LAB — POCT HEMOGLOBIN-HEMACUE: Hemoglobin: 10.1 g/dL — ABNORMAL LOW (ref 12.0–15.0)

## 2018-09-28 ENCOUNTER — Other Ambulatory Visit: Payer: Self-pay

## 2018-09-28 ENCOUNTER — Ambulatory Visit (INDEPENDENT_AMBULATORY_CARE_PROVIDER_SITE_OTHER): Payer: Medicare Other | Admitting: Podiatry

## 2018-09-28 ENCOUNTER — Encounter: Payer: Self-pay | Admitting: Podiatry

## 2018-09-28 DIAGNOSIS — E1151 Type 2 diabetes mellitus with diabetic peripheral angiopathy without gangrene: Secondary | ICD-10-CM | POA: Diagnosis not present

## 2018-09-28 DIAGNOSIS — B351 Tinea unguium: Secondary | ICD-10-CM | POA: Diagnosis not present

## 2018-09-28 DIAGNOSIS — M79674 Pain in right toe(s): Secondary | ICD-10-CM | POA: Diagnosis not present

## 2018-09-28 DIAGNOSIS — M79675 Pain in left toe(s): Secondary | ICD-10-CM | POA: Diagnosis not present

## 2018-09-28 DIAGNOSIS — L600 Ingrowing nail: Secondary | ICD-10-CM

## 2018-09-28 NOTE — Patient Instructions (Signed)
Diabetes Mellitus and Foot Care Foot care is an important part of your health, especially when you have diabetes. Diabetes may cause you to have problems because of poor blood flow (circulation) to your feet and legs, which can cause your skin to:  Become thinner and drier.  Break more easily.  Heal more slowly.  Peel and crack. You may also have nerve damage (neuropathy) in your legs and feet, causing decreased feeling in them. This means that you may not notice minor injuries to your feet that could lead to more serious problems. Noticing and addressing any potential problems early is the best way to prevent future foot problems. How to care for your feet Foot hygiene  Wash your feet daily with warm water and mild soap. Do not use hot water. Then, pat your feet and the areas between your toes until they are completely dry. Do not soak your feet as this can dry your skin.  Trim your toenails straight across. Do not dig under them or around the cuticle. File the edges of your nails with an emery board or nail file.  Apply a moisturizing lotion or petroleum jelly to the skin on your feet and to dry, brittle toenails. Use lotion that does not contain alcohol and is unscented. Do not apply lotion between your toes. Shoes and socks  Wear clean socks or stockings every day. Make sure they are not too tight. Do not wear knee-high stockings since they may decrease blood flow to your legs.  Wear shoes that fit properly and have enough cushioning. Always look in your shoes before you put them on to be sure there are no objects inside.  To break in new shoes, wear them for just a few hours a day. This prevents injuries on your feet. Wounds, scrapes, corns, and calluses  Check your feet daily for blisters, cuts, bruises, sores, and redness. If you cannot see the bottom of your feet, use a mirror or ask someone for help.  Do not cut corns or calluses or try to remove them with medicine.  If you  find a minor scrape, cut, or break in the skin on your feet, keep it and the skin around it clean and dry. You may clean these areas with mild soap and water. Do not clean the area with peroxide, alcohol, or iodine.  If you have a wound, scrape, corn, or callus on your foot, look at it several times a day to make sure it is healing and not infected. Check for: ? Redness, swelling, or pain. ? Fluid or blood. ? Warmth. ? Pus or a bad smell. General instructions  Do not cross your legs. This may decrease blood flow to your feet.  Do not use heating pads or hot water bottles on your feet. They may burn your skin. If you have lost feeling in your feet or legs, you may not know this is happening until it is too late.  Protect your feet from hot and cold by wearing shoes, such as at the beach or on hot pavement.  Schedule a complete foot exam at least once a year (annually) or more often if you have foot problems. If you have foot problems, report any cuts, sores, or bruises to your health care provider immediately. Contact a health care provider if:  You have a medical condition that increases your risk of infection and you have any cuts, sores, or bruises on your feet.  You have an injury that is not   healing.  You have redness on your legs or feet.  You feel burning or tingling in your legs or feet.  You have pain or cramps in your legs and feet.  Your legs or feet are numb.  Your feet always feel cold.  You have pain around a toenail. Get help right away if:  You have a wound, scrape, corn, or callus on your foot and: ? You have pain, swelling, or redness that gets worse. ? You have fluid or blood coming from the wound, scrape, corn, or callus. ? Your wound, scrape, corn, or callus feels warm to the touch. ? You have pus or a bad smell coming from the wound, scrape, corn, or callus. ? You have a fever. ? You have a red line going up your leg. Summary  Check your feet every day  for cuts, sores, red spots, swelling, and blisters.  Moisturize feet and legs daily.  Wear shoes that fit properly and have enough cushioning.  If you have foot problems, report any cuts, sores, or bruises to your health care provider immediately.  Schedule a complete foot exam at least once a year (annually) or more often if you have foot problems. This information is not intended to replace advice given to you by your health care provider. Make sure you discuss any questions you have with your health care provider. Document Released: 01/08/2000 Document Revised: 02/22/2017 Document Reviewed: 02/12/2016 Elsevier Patient Education  2020 [employer]   Onychomycosis/Fungal Toenails  WHAT IS IT? An infection that lies within the keratin of your nail plate that is caused by a fungus.  WHY ME? Fungal infections affect all ages, sexes, races, and creeds.  There may be many factors that predispose you to a fungal infection such as age, coexisting medical conditions such as diabetes, or an autoimmune disease; stress, medications, fatigue, genetics, etc.  Bottom line: fungus thrives in a warm, moist environment and your shoes offer such a location.  IS IT CONTAGIOUS? Theoretically, yes.  You do not want to share shoes, nail clippers or files with someone who has fungal toenails.  Walking around barefoot in the same room or sleeping in the same bed is unlikely to transfer the organism.  It is important to realize, however, that fungus can spread easily from one nail to the next on the same foot.  HOW DO WE TREAT THIS?  There are several ways to treat this condition.  Treatment may depend on many factors such as age, medications, pregnancy, liver and kidney conditions, etc.  It is best to ask your doctor which options are available to you.  1. No treatment.   Unlike many other medical concerns, you can live with this condition.  However for many people this can be a painful condition and may lead to  ingrown toenails or a bacterial infection.  It is recommended that you keep the nails cut short to help reduce the amount of fungal nail. 2. Topical treatment.  These range from herbal remedies to prescription strength nail lacquers.  About 40-50% effective, topicals require twice daily application for approximately 9 to 12 months or until an entirely new nail has grown out.  The most effective topicals are medical grade medications available through physicians offices. 3. Oral antifungal medications.  With an 80-90% cure rate, the most common oral medication requires 3 to 4 months of therapy and stays in your system for a year as the new nail grows out.  Oral antifungal medications do require   blood work to make sure it is a safe drug for you.  A liver function panel will be performed prior to starting the medication and after the first month of treatment.  It is important to have the blood work performed to avoid any harmful side effects.  In general, this medication safe but blood work is required. 4. Laser Therapy.  This treatment is performed by applying a specialized laser to the affected nail plate.  This therapy is noninvasive, fast, and non-painful.  It is not covered by insurance and is therefore, out of pocket.  The results have been very good with a 80-95% cure rate.  [facility] is the only practice in the area to offer this therapy. 5. Permanent Nail Avulsion.  Removing the entire nail so that a new nail will not grow back. 

## 2018-10-02 ENCOUNTER — Ambulatory Visit: Payer: Medicare Other | Admitting: Endocrinology

## 2018-10-02 DIAGNOSIS — E1122 Type 2 diabetes mellitus with diabetic chronic kidney disease: Secondary | ICD-10-CM | POA: Diagnosis not present

## 2018-10-02 DIAGNOSIS — N2581 Secondary hyperparathyroidism of renal origin: Secondary | ICD-10-CM | POA: Diagnosis not present

## 2018-10-02 DIAGNOSIS — Z23 Encounter for immunization: Secondary | ICD-10-CM | POA: Diagnosis not present

## 2018-10-02 DIAGNOSIS — N183 Chronic kidney disease, stage 3 (moderate): Secondary | ICD-10-CM | POA: Diagnosis not present

## 2018-10-02 DIAGNOSIS — D631 Anemia in chronic kidney disease: Secondary | ICD-10-CM | POA: Diagnosis not present

## 2018-10-02 DIAGNOSIS — I129 Hypertensive chronic kidney disease with stage 1 through stage 4 chronic kidney disease, or unspecified chronic kidney disease: Secondary | ICD-10-CM | POA: Diagnosis not present

## 2018-10-03 NOTE — Progress Notes (Signed)
Subjective:  [name] presents to clinic today with cc of  painful, thick, discolored, elongated toenails 1-5 b/l that become tender and cannot cut because of thickness. Pain is aggravated when wearing enclosed shoe gear.  [name] has h/o diabetes and PAD.  She c/o b/l great toe pain due to elongated, ingrown toenails. She denies any redness, swelling or drainage.    Current Outpatient Medications:  .  amLODipine (NORVASC) 10 MG tablet, Take 1 tablet (10 mg total) by mouth daily., Disp: 90 tablet, Rfl: 3 .  aspirin 325 MG tablet, Take 325 mg by mouth daily., Disp: , Rfl:  .  atorvastatin (LIPITOR) 80 MG tablet, TAKE 1 TABLET DAILY., Disp: 90 tablet, Rfl: 2 .  bromocriptine (PARLODEL) 2.5 MG tablet, 1/4 tab daily, Disp: 8 tablet, Rfl: 11 .  calcitRIOL (ROCALTROL) 0.25 MCG capsule, 1 (ONE) CAPSULE THREE TIMES A WEEK, Disp: , Rfl:  .  cloNIDine (CATAPRES) 0.2 MG tablet, Take 1 tablet (0.2 mg total) by mouth 3 (three) times daily., Disp: 90 tablet, Rfl: 0 .  furosemide (LASIX) 40 MG tablet, Take 1 tablet (40 mg total) by mouth See admin instructions. Take 1 tablet daily, can take another pill if ankles are swelling -Hold for the next 3 days and resume on 09/13/2017, Disp: 30 tablet, Rfl:  .  glucose blood (ONETOUCH VERIO) test strip, USE AS INSTRUCTED (TESTS 2 TIMES A DAY), Disp: 100 each, Rfl: 5 .  metoprolol succinate (TOPROL-XL) 100 MG 24 hr tablet, Take 1 tablet (100 mg total) by mouth daily. for high blood pressure, Disp: 90 tablet, Rfl: 1 .  minoxidil (LONITEN) 2.5 MG tablet, Take 2.5 mg by mouth 2 (two) times daily., Disp: , Rfl:  .  pantoprazole (PROTONIX) 40 MG tablet, Take 1 tablet (40 mg total) by mouth daily., Disp: 90 tablet, Rfl: 0 .  repaglinide (PRANDIN) 2 MG tablet, Take 1 tablet (2 mg total) by mouth 3 (three) times daily before meals., Disp: 90 tablet, Rfl: 11 .  telmisartan-hydrochlorothiazide (MICARDIS HCT) 80-25 MG tablet, Take 1 tablet by mouth daily., Disp: 90  tablet, Rfl: 1 .  vitamin B-12 (CYANOCOBALAMIN) 100 MCG tablet, Take 1 tablet (100 mcg total) by mouth at bedtime., Disp: 90 tablet, Rfl: 1   No Known Allergies   Objective: Physical Examination:  Vascular Examination: Capillary refill time <3 seconds x 10 digits.  DP pulses 1/4 b/l.  PT pulses 0/4 b/l.  Digital hair absent b/l.  No edema noted b/l.  Skin temperature gradient WNL b/l.  Dermatological Examination: Pedal skin thin, shiny and atrophic b/l.  No open wounds b/l.  No interdigital macerations noted b/l.  Elongated, thick, discolored brittle toenails with subungual debris and pain on dorsal palpation of nailbeds 1-5 b/l. Incurvated nailplate b/l great toes with tenderness to palpation. No erythema, no edema, no drainage noted.  Musculoskeletal Examination: Muscle strength 5/5 to all muscle groups b/l.  No pain, crepitus or joint discomfort with active/passive ROM.  Neurological Examination: Sensation intact 5/5 b/l with 10 gram monofilament.  Vibratory sensation intact b/l.  Proprioceptive sensation intact b/l.  Assessment: Mycotic nail infection with pain 1-5 b/l Ingrown toenails b/l great toes, noninfeced NIDDM with PAD  Plan: 1. Toenails 1-5 b/l were debrided in length and girth without iatrogenic laceration. Offending nail borders debrided and curretaged b/l great toes. Borders cleansed with alcohol. Antibiotic ointment applied. No further treatment required by patient. 2.  Continue soft, supportive shoe gear daily. 3.  Report any pedal injuries to medical  professional. 4.  Follow up 10 weeks. 5.  Patient/POA to call should there be a question/concern in there interim.

## 2018-10-05 ENCOUNTER — Other Ambulatory Visit: Payer: Self-pay

## 2018-10-08 ENCOUNTER — Other Ambulatory Visit: Payer: Self-pay | Admitting: Family

## 2018-10-08 MED ORDER — ONETOUCH VERIO VI STRP: ORAL_STRIP | 5 refills | Status: DC

## 2018-10-09 ENCOUNTER — Encounter: Payer: Self-pay | Admitting: Endocrinology

## 2018-10-09 ENCOUNTER — Ambulatory Visit (INDEPENDENT_AMBULATORY_CARE_PROVIDER_SITE_OTHER): Payer: Medicare Other | Admitting: Endocrinology

## 2018-10-09 ENCOUNTER — Other Ambulatory Visit: Payer: Self-pay

## 2018-10-09 VITALS — BP 132/70 | HR 60 | Ht 62.0 in | Wt 141.2 lb

## 2018-10-09 DIAGNOSIS — E1122 Type 2 diabetes mellitus with diabetic chronic kidney disease: Secondary | ICD-10-CM

## 2018-10-09 DIAGNOSIS — N183 Chronic kidney disease, stage 3 (moderate): Secondary | ICD-10-CM | POA: Diagnosis not present

## 2018-10-09 LAB — POCT GLYCOSYLATED HEMOGLOBIN (HGB A1C): Hemoglobin A1C: 7.2 % — AB (ref 4.0–5.6)

## 2018-10-09 MED ORDER — BROMOCRIPTINE MESYLATE 2.5 MG PO TABS: 1.2500 mg | ORAL_TABLET | Freq: Every day | ORAL | 11 refills | Status: DC

## 2018-10-09 MED ORDER — BROMOCRIPTINE MESYLATE 2.5 MG PO TABS: 1.2500 mg | ORAL_TABLET | Freq: Every day | ORAL | 2 refills | Status: DC

## 2018-10-09 NOTE — Progress Notes (Signed)
Subjective:    Patient ID: [name], female    DOB: Sep 01, 1950, 68 y.o.   MRN: [id number]  HPI Pt returns for f/u of diabetes mellitus:  DM type: 2, but she may be developing type 1 Dx'ed: 7341 Complications: polyneuropathy, renal insuff, CVA's, and PDR.  Therapy: 2 oral meds GDM: never DKA: never Severe hypoglycemia: never.   Pancreatitis: never Pancreatic imaging: never Other: she cannot afford brand-name meds; she has never been on insulin, but she has taken trulicity; renal insuff limits metformin dosage; edema limits rx options.   Interval history:  no cbg record, but states cbg's are well-controlled.  pt states she feels well in general.   Past Medical History:  Diagnosis Date  . Acute kidney injury ([place]) 01/08/2015  . Chronic back pain   . Cough   . Diabetes mellitus, type 2 ([place])   . Diverticulosis   . Fibroid    patient thinks this was the reason for her hysterectomy  . GERD (gastroesophageal reflux disease)   . History of sebaceous cyst   . Hyperlipemia   . Hyperplastic colon polyp   . Hypertension   . Hyperthyroidism   . Lumbar radiculopathy   . Shortness of breath 09/13/2013  . Stroke ([place])    x4   . Tobacco abuse     Past Surgical History:  Procedure Laterality Date  . ABDOMINAL HYSTERECTOMY     --?ovaries remain  . COLONOSCOPY W/ BIOPSIES    . RETINOPATHY SURGERY Bilateral 2013   Dr. Zigmund Daniel    Social History   Socioeconomic History  . Marital status: Divorced    Spouse name: Not on file  . Number of children: 1  . Years of education: 22  . Highest education level: Not on file  Occupational History  . Occupation: Insurance account manager: [employer]    Comment: Retired  Scientific laboratory technician  . Financial resource strain: Not hard at all  . Food insecurity    Worry: Never true    Inability: Never true  . Transportation needs    Medical: No    Non-medical: No  Tobacco Use  . Smoking status: Former Smoker    Packs/day: 0.50     Years: 35.00    Pack years: 17.50    Types: Cigarettes    Quit date: 07/07/2014    Years since quitting: 4.2  . Smokeless tobacco: Never Used  Substance and Sexual Activity  . Alcohol use: No  . Drug use: No  . Sexual activity: Never    Partners: Male    Birth control/protection: Surgical    Comment: TAH--?ovaries remain  Lifestyle  . Physical activity    Days per week: 6 days    Minutes per session: 40 min  . Stress: Not at all  Relationships  . Social connections    Talks on phone: More than three times a week    Gets together: More than three times a week    Attends religious service: More than 4 times per year    Active member of club or organization: Yes    Attends meetings of clubs or organizations: More than 4 times per year    Relationship status: Divorced  . Intimate partner violence    Fear of current or ex partner: No    Emotionally abused: No    Physically abused: No    Forced sexual activity: No  Other Topics Concern  . Not on file  Social History Narrative  Divorced 1 son    Retired Museum/gallery curator for [employer]   Former smoker no alcohol caffeine or drug use report denies abuse and feels safe at home.    Current Outpatient Medications on File Prior to Visit  Medication Sig Dispense Refill  . amLODipine (NORVASC) 10 MG tablet Take 1 tablet (10 mg total) by mouth daily. 90 tablet 3  . aspirin 325 MG tablet Take 325 mg by mouth daily.    [name] atorvastatin (LIPITOR) 80 MG tablet TAKE 1 TABLET DAILY. 90 tablet 2  . calcitRIOL (ROCALTROL) 0.25 MCG capsule 1 (ONE) CAPSULE THREE TIMES A WEEK    . cloNIDine (CATAPRES) 0.2 MG tablet Take 1 tablet (0.2 mg total) by mouth 3 (three) times daily. 90 tablet 0  . furosemide (LASIX) 40 MG tablet Take 1 tablet (40 mg total) by mouth See admin instructions. Take 1 tablet daily, can take another pill if ankles are swelling -Hold for the next 3 days and resume on 09/13/2017 30 tablet   . glucose blood (ONETOUCH  VERIO) test strip USE AS INSTRUCTED (TESTS 2 TIMES A DAY) 100 each 5  . metoprolol succinate (TOPROL-XL) 100 MG 24 hr tablet Take 1 tablet (100 mg total) by mouth daily. for high blood pressure 90 tablet 1  . minoxidil (LONITEN) 2.5 MG tablet Take 2.5 mg by mouth 2 (two) times daily.    . pantoprazole (PROTONIX) 40 MG tablet Take 1 tablet (40 mg total) by mouth daily. 90 tablet 0  . repaglinide (PRANDIN) 2 MG tablet Take 1 tablet (2 mg total) by mouth 3 (three) times daily before meals. 90 tablet 11  . telmisartan-hydrochlorothiazide (MICARDIS HCT) 80-25 MG tablet Take 1 tablet by mouth daily. 90 tablet 1  . vitamin B-12 (CYANOCOBALAMIN) 100 MCG tablet Take 1 tablet (100 mcg total) by mouth at bedtime. 90 tablet 1   No current facility-administered medications on file prior to visit.     No Known Allergies  Family History  Problem Relation Age of Onset  . Hypertension Mother   . Sleep apnea Mother   . Diabetes Mother   . Hyperlipidemia Mother   . Lung cancer Father        smoker  . Hypertension Sister   . Diabetes Sister   . Hypertension Brother   . Hypertension Sister   . Diabetes Brother   . Hypertension Brother   . Breast cancer Neg Hx   . Colon cancer Neg Hx   . Esophageal cancer Neg Hx   . Pancreatic cancer Neg Hx   . Liver disease Neg Hx     BP 132/70 (BP Location: Left Arm, Patient Position: Sitting, Cuff Size: Normal)   Pulse 60   Ht 5\' 2"  (1.575 m)   Wt 141 lb 3.2 oz (64 kg)   SpO2 97%   BMI 25.83 kg/m    Review of Systems She denies hypoglycemia.      Objective:   Physical Exam VITAL SIGNS:  See vs page GENERAL: no distress Pulses: dorsalis pedis intact bilat.   MSK: no deformity of the feet CV: no leg edema Skin:  no ulcer on the feet.  normal color and temp on the feet. Neuro: sensation is intact to touch on the feet, but decreased from normal Ext: there is bilateral onychomycosis of the toenails  Lab Results  Component Value Date   HGBA1C 7.2  (A) 10/09/2018   Lab Results  Component Value Date   CREATININE 2.28 (H) 07/11/2018  BUN 97 (HH) 07/11/2018   NA 132 (L) 07/11/2018   K 3.5 07/11/2018   CL 95 (L) 07/11/2018   CO2 28 07/11/2018       Assessment & Plan:  HTN: improved control.  bromocriptine may be helping Type 2 DM: she needs increased rx, if it can be done with a regimen that avoids or minimizes hypoglycemia. Renal insuff: This limits rx options   Patient Instructions  I have sent a prescription to your pharmacy, to increase the bromocriptine to 1/2 pill per day check your blood sugar once a day.  vary the time of day when you check, between before the 3 meals, and at bedtime.  also check if you have symptoms of your blood sugar being too high or too low.  please keep a record of the readings and bring it to your next appointment here (or you can bring the meter itself).  You can write it on any piece of paper.  please call us sooner if your blood sugar goes below 70, or if you have a lot of readings over 200.   Please come back for a follow-up appointment in 3 months.

## 2018-10-09 NOTE — Patient Instructions (Addendum)
I have sent a prescription to your pharmacy, to increase the bromocriptine to 1/2 pill per day check your blood sugar once a day.  vary the time of day when you check, between before the 3 meals, and at bedtime.  also check if you have symptoms of your blood sugar being too high or too low.  please keep a record of the readings and bring it to your next appointment here (or you can bring the meter itself).  You can write it on any piece of paper.  please call us sooner if your blood sugar goes below 70, or if you have a lot of readings over 200.   Please come back for a follow-up appointment in 3 months.

## 2018-10-17 DIAGNOSIS — N183 Chronic kidney disease, stage 3 (moderate): Secondary | ICD-10-CM | POA: Diagnosis not present

## 2018-10-17 DIAGNOSIS — I10 Essential (primary) hypertension: Secondary | ICD-10-CM | POA: Diagnosis not present

## 2018-10-19 ENCOUNTER — Other Ambulatory Visit: Payer: Self-pay | Admitting: Family

## 2018-10-19 MED ORDER — PANTOPRAZOLE SODIUM 40 MG PO TBEC: 40.0000 mg | DELAYED_RELEASE_TABLET | Freq: Every day | ORAL | 2 refills | Status: DC

## 2018-10-24 ENCOUNTER — Other Ambulatory Visit: Payer: Self-pay

## 2018-10-24 ENCOUNTER — Ambulatory Visit (HOSPITAL_COMMUNITY)
Admission: RE | Admit: 2018-10-24 | Discharge: 2018-10-24 | Disposition: A | Discharge status: Home / Self Care | Payer: Medicare Other | Source: Ambulatory Visit | Attending: Nephrology | Admitting: Nephrology

## 2018-10-24 VITALS — BP 126/54 | HR 57 | Temp 96.6°F | Resp 18

## 2018-10-24 DIAGNOSIS — N183 Chronic kidney disease, stage 3 unspecified: Secondary | ICD-10-CM

## 2018-10-24 LAB — POCT HEMOGLOBIN-HEMACUE: Hemoglobin: 9.4 g/dL — ABNORMAL LOW (ref 12.0–15.0)

## 2018-10-24 MED ORDER — EPOETIN ALFA-EPBX 40000 UNIT/ML IJ SOLN
30000.0000 [IU] | INTRAMUSCULAR | Status: DC
  Administered 2018-10-24: 10:00:00 30000 [IU] via SUBCUTANEOUS
  Filled 2018-10-24: qty 1

## 2018-11-02 ENCOUNTER — Other Ambulatory Visit: Payer: Self-pay | Admitting: Family

## 2018-11-02 DIAGNOSIS — I10 Essential (primary) hypertension: Secondary | ICD-10-CM | POA: Diagnosis not present

## 2018-11-02 DIAGNOSIS — N183 Chronic kidney disease, stage 3 unspecified: Secondary | ICD-10-CM | POA: Diagnosis not present

## 2018-11-02 DIAGNOSIS — R899 Unspecified abnormal finding in specimens from other organs, systems and tissues: Secondary | ICD-10-CM

## 2018-11-21 ENCOUNTER — Ambulatory Visit (HOSPITAL_COMMUNITY)
Admission: RE | Admit: 2018-11-21 | Discharge: 2018-11-21 | Disposition: A | Discharge status: Home / Self Care | Payer: Medicare Other | Source: Ambulatory Visit | Attending: Nephrology | Admitting: Nephrology

## 2018-11-21 ENCOUNTER — Other Ambulatory Visit: Payer: Self-pay

## 2018-11-21 DIAGNOSIS — D631 Anemia in chronic kidney disease: Secondary | ICD-10-CM | POA: Insufficient documentation

## 2018-11-21 DIAGNOSIS — N183 Chronic kidney disease, stage 3 unspecified: Secondary | ICD-10-CM | POA: Insufficient documentation

## 2018-11-21 LAB — IRON AND TIBC
Iron: 43 ug/dL (ref 28–170)
Saturation Ratios: 14 % (ref 10.4–31.8)
TIBC: 302 ug/dL (ref 250–450)
UIBC: 259 ug/dL

## 2018-11-21 LAB — POCT HEMOGLOBIN-HEMACUE: Hemoglobin: 9.6 g/dL — ABNORMAL LOW (ref 12.0–15.0)

## 2018-11-21 LAB — FERRITIN: Ferritin: 38 ng/mL (ref 11–307)

## 2018-11-21 MED ORDER — EPOETIN ALFA-EPBX 40000 UNIT/ML IJ SOLN
30000.0000 [IU] | INTRAMUSCULAR | Status: DC
  Administered 2018-11-21: 30000 [IU] via SUBCUTANEOUS
  Filled 2018-11-21: qty 1

## 2018-11-30 ENCOUNTER — Emergency Department (HOSPITAL_COMMUNITY): Payer: Medicare Other

## 2018-11-30 ENCOUNTER — Emergency Department (HOSPITAL_COMMUNITY)
Admission: EM | Admit: 2018-11-30 | Discharge: 2018-11-30 | Disposition: A | Discharge status: Home / Self Care | Payer: Medicare Other | Attending: Emergency Medicine | Admitting: Emergency Medicine

## 2018-11-30 ENCOUNTER — Encounter (HOSPITAL_COMMUNITY): Payer: Self-pay

## 2018-11-30 ENCOUNTER — Other Ambulatory Visit: Payer: Self-pay

## 2018-11-30 DIAGNOSIS — I10 Essential (primary) hypertension: Secondary | ICD-10-CM | POA: Insufficient documentation

## 2018-11-30 DIAGNOSIS — Z79899 Other long term (current) drug therapy: Secondary | ICD-10-CM | POA: Diagnosis not present

## 2018-11-30 DIAGNOSIS — Z87891 Personal history of nicotine dependence: Secondary | ICD-10-CM | POA: Diagnosis not present

## 2018-11-30 DIAGNOSIS — Z7982 Long term (current) use of aspirin: Secondary | ICD-10-CM | POA: Insufficient documentation

## 2018-11-30 DIAGNOSIS — E119 Type 2 diabetes mellitus without complications: Secondary | ICD-10-CM | POA: Insufficient documentation

## 2018-11-30 DIAGNOSIS — Z8673 Personal history of transient ischemic attack (TIA), and cerebral infarction without residual deficits: Secondary | ICD-10-CM | POA: Insufficient documentation

## 2018-11-30 DIAGNOSIS — M79672 Pain in left foot: Secondary | ICD-10-CM | POA: Insufficient documentation

## 2018-11-30 DIAGNOSIS — M7989 Other specified soft tissue disorders: Secondary | ICD-10-CM | POA: Diagnosis not present

## 2018-11-30 MED ORDER — IBUPROFEN 200 MG PO TABS
400.0000 mg | ORAL_TABLET | Freq: Once | ORAL | Status: AC
  Administered 2018-11-30: 400 mg via ORAL
  Filled 2018-11-30: qty 2

## 2018-11-30 MED ORDER — ETODOLAC 300 MG PO CAPS: 300.0000 mg | ORAL_CAPSULE | Freq: Three times a day (TID) | ORAL | 0 refills | Status: DC

## 2018-11-30 NOTE — Discharge Instructions (Signed)
Use the crutches and a postop shoe, try to rest your foot, take the pain medications as prescribed, follow-up with the orthopedic doctor for further evaluation.

## 2018-11-30 NOTE — ED Triage Notes (Addendum)
C/O left foot pain that started Wednesday.  C/o slight swelling and tender to the touch per pt   Denies falling or injuries.      A/ox4  Wheelchair in triage

## 2018-11-30 NOTE — ED Provider Notes (Signed)
[facility] DEPT Provider Note   CSN: [id number] Arrival date & time: 11/30/18  1843     History   Chief Complaint Chief Complaint  Patient presents with  . Foot Pain    HPI [name] is a 68 y.o. female.     HPI Patient presents emergency room for evaluation of foot pain.  Patient states few days ago she started having pain in her left foot.  Pain has been increasing and it is hard for her to bear weight now.  She does not recall any falls or injuries.  She does not have any fevers or chills.  No history of trouble with gout or previous issues with her foot. Past Medical History:  Diagnosis Date  . Acute kidney injury ([place]) 01/08/2015  . Chronic back pain   . Cough   . Diabetes mellitus, type 2 ([place])   . Diverticulosis   . Fibroid    patient thinks this was the reason for her hysterectomy  . GERD (gastroesophageal reflux disease)   . History of sebaceous cyst   . Hyperlipemia   . Hyperplastic colon polyp   . Hypertension   . Hyperthyroidism   . Lumbar radiculopathy   . Shortness of breath 09/13/2013  . Stroke ([place])    x4   . Tobacco abuse     Patient Active Problem List   Diagnosis Date Noted  . Vitamin D toxicity, accidental or unintentional, initial encounter 09/09/2017  . Ataxia 09/09/2017  . Type 2 diabetes mellitus with hyperglycemia ([place]) 09/09/2017  . Chronic kidney disease (CKD)   . Olecranon bursitis of right elbow 03/09/2017  . Thyroid nodule 06/29/2016  . Type 2 diabetes mellitus ([place]) 06/13/2016  . Vitamin D deficiency 01/12/2016  . Medicare annual wellness visit, initial 01/11/2016  . Lacunar infarct, acute ([place]) 03/23/2015  . Non compliance w medication regimen 01/16/2015  . Anemia 01/08/2015  . Stroke ([place]) 01/08/2015  . Hyperlipemia   . Carotid bruit present 12/13/2012  . Routine health maintenance 10/24/2010  . PERIPHERAL EDEMA 09/10/2009  . ELECTROCARDIOGRAM, ABNORMAL 04/11/2008  . HEART MURMUR,  SYSTOLIC 76/19/5093  . SEBACEOUS CYST, NECK 12/13/2006  . Hyperthyroidism 09/27/2006  . Secondary diabetes with peripheral vascular disease ([place]) 09/27/2006  . Hyperlipidemia LDL goal <70 09/27/2006  . TOBACCO ABUSE 09/27/2006  . Essential hypertension 09/27/2006    Past Surgical History:  Procedure Laterality Date  . ABDOMINAL HYSTERECTOMY     --?ovaries remain  . COLONOSCOPY W/ BIOPSIES    . RETINOPATHY SURGERY Bilateral 2013   Dr. Zigmund Daniel     OB History    Gravida  1   Para  1   Term      Preterm      AB      Living  1     SAB      TAB      Ectopic      Multiple      Live Births               Home Medications    Prior to Admission medications   Medication Sig Start Date End Date Taking? Authorizing Provider  amLODipine (NORVASC) 10 MG tablet Take 1 tablet (10 mg total) by mouth daily. 03/26/15   Golden Circle, FNP  aspirin 325 MG tablet Take 325 mg by mouth daily.    [provider]  atorvastatin (LIPITOR) 80 MG tablet TAKE 1 TABLET DAILY. 09/10/18   Marrian Salvage, [place]  bromocriptine (PARLODEL) 2.5 MG tablet Take 0.5 tablets (1.25 mg total) by mouth daily. 10/09/18   Renato Shin, MD  calcitRIOL (ROCALTROL) 0.25 MCG capsule 1 (ONE) CAPSULE THREE TIMES A WEEK 02/17/18   [provider]  cloNIDine (CATAPRES) 0.2 MG tablet Take 1 tablet (0.2 mg total) by mouth 3 (three) times daily. 08/04/17   Lance Sell, NP  etodolac (LODINE) 300 MG capsule Take 1 capsule (300 mg total) by mouth every 8 (eight) hours. 11/30/18   Dorie Rank, MD  furosemide (LASIX) 40 MG tablet Take 1 tablet (40 mg total) by mouth See admin instructions. Take 1 tablet daily, can take another pill if ankles are swelling -Hold for the next 3 days and resume on 09/13/2017 09/09/17   Elgergawy, Silver Huguenin, MD  glucose blood (ONETOUCH VERIO) test strip USE AS INSTRUCTED (TESTS 2 TIMES A DAY) 10/08/18   Marrian Salvage, FNP  metoprolol succinate (TOPROL-XL)  100 MG 24 hr tablet Take 1 tablet (100 mg total) by mouth daily. for high blood pressure 08/17/18   Marrian Salvage, FNP  minoxidil (LONITEN) 2.5 MG tablet Take 2.5 mg by mouth 2 (two) times daily.    [provider]  pantoprazole (PROTONIX) 40 MG tablet Take 1 tablet (40 mg total) by mouth daily. 10/19/18   Marrian Salvage, FNP  repaglinide (PRANDIN) 2 MG tablet Take 1 tablet (2 mg total) by mouth 3 (three) times daily before meals. 03/07/18   Renato Shin, MD  telmisartan-hydrochlorothiazide (MICARDIS HCT) 80-25 MG tablet Take 1 tablet by mouth daily. 07/24/18   Marrian Salvage, FNP  vitamin B-12 (CYANOCOBALAMIN) 100 MCG tablet Take 1 tablet (100 mcg total) by mouth at bedtime. 08/17/18   Marrian Salvage, FNP    Family History Family History  Problem Relation Age of Onset  . Hypertension Mother   . Sleep apnea Mother   . Diabetes Mother   . Hyperlipidemia Mother   . Lung cancer Father        smoker  . Hypertension Sister   . Diabetes Sister   . Hypertension Brother   . Hypertension Sister   . Diabetes Brother   . Hypertension Brother   . Breast cancer Neg Hx   . Colon cancer Neg Hx   . Esophageal cancer Neg Hx   . Pancreatic cancer Neg Hx   . Liver disease Neg Hx     Social History Social History   Tobacco Use  . Smoking status: Former Smoker    Packs/day: 0.50    Years: 35.00    Pack years: 17.50    Types: Cigarettes    Quit date: 07/07/2014    Years since quitting: 4.4  . Smokeless tobacco: Never Used  Substance Use Topics  . Alcohol use: No  . Drug use: No     Allergies   Patient has no known allergies.   Review of Systems Review of Systems  All other systems reviewed and are negative.    Physical Exam Updated Vital Signs BP (!) 154/61 (BP Location: Left Arm)   Pulse 70   Temp 98.2 F (36.8 C) (Oral)   Resp 17   SpO2 97%   Physical Exam Vitals signs and nursing note reviewed.  Constitutional:      General: She  is not in acute distress.    Appearance: She is well-developed.  HENT:     Head: Normocephalic and atraumatic.     Right Ear: External ear normal.  Left Ear: External ear normal.  Eyes:     General: No scleral icterus.       Right eye: No discharge.        Left eye: No discharge.     Conjunctiva/sclera: Conjunctivae normal.  Neck:     Musculoskeletal: Neck supple.     Trachea: No tracheal deviation.  Cardiovascular:     Rate and Rhythm: Normal rate.  Pulmonary:     Effort: Pulmonary effort is normal. No respiratory distress.     Breath sounds: No stridor.  Abdominal:     General: There is no distension.  Musculoskeletal:        General: No swelling or deformity.     Left foot: Tenderness and swelling present. No deformity.     Comments: Mild erythema and tenderness left midfoot, no lymphangitic streaking, no induration, distal sensation and cap refill intact  Skin:    General: Skin is warm and dry.     Findings: No rash.  Neurological:     Mental Status: She is alert.     Cranial Nerves: Cranial nerve deficit: no gross deficits.      ED Treatments / Results  Labs (all labs ordered are listed, but only abnormal results are displayed) Labs Reviewed - No data to display  EKG None  Radiology Dg Foot Complete Left  Result Date: 11/30/2018 CLINICAL DATA:  67 year old female with 2 days of foot pain and swelling. No known injury. EXAM: LEFT FOOT - COMPLETE 3+ VIEW COMPARISON:  None. FINDINGS: Prominent degenerative spurring at the calcaneus. Heterogeneous bone mineralization. No fracture, dislocation, or cortical osteolysis identified. Joint spaces and alignment within normal limits. No soft tissue gas or discrete soft tissue injury. IMPRESSION: No acute osseous abnormality identified. Electronically Signed   By: Genevie Ann M.D.   On: 11/30/2018 19:27    Procedures Procedures (including critical care time)  Medications Ordered in ED Medications  ibuprofen (ADVIL) tablet  400 mg (has no administration in time range)     Initial Impression / Assessment and Plan / ED Course  I have reviewed the triage vital signs and the nursing notes.  Pertinent labs & imaging results that were available during my care of the patient were reviewed by me and considered in my medical decision making (see chart for details).   X-rays without acute findings.  Patient does have evidence of some mild soft tissue swelling.  I doubt acute infection.  Gout seems unlikely.  Questionable tendinitis or arthritis.  Plan on discharge home with NSAIDs and crutches.  Recommend outpatient follow-up with orthopedics.  Final Clinical Impressions(s) / ED Diagnoses   Final diagnoses:  Left foot pain    ED Discharge Orders         Ordered    etodolac (LODINE) 300 MG capsule  Every 8 hours    Note to Pharmacy: As needed for pain   11/30/18 1957           Dorie Rank, MD 11/30/18 1958

## 2018-12-05 ENCOUNTER — Encounter: Payer: Self-pay | Admitting: Internal Medicine

## 2018-12-05 ENCOUNTER — Other Ambulatory Visit: Payer: Self-pay

## 2018-12-05 ENCOUNTER — Ambulatory Visit (AMBULATORY_SURGERY_CENTER): Payer: Medicare Other | Admitting: *Deleted

## 2018-12-05 VITALS — Temp 96.9°F | Ht 62.0 in | Wt 144.0 lb

## 2018-12-05 DIAGNOSIS — Z1211 Encounter for screening for malignant neoplasm of colon: Secondary | ICD-10-CM

## 2018-12-05 DIAGNOSIS — Z1159 Encounter for screening for other viral diseases: Secondary | ICD-10-CM

## 2018-12-05 NOTE — Progress Notes (Signed)
Patient is here in-person for PV. Patient denies any allergies to eggs or soy. Patient denies any problems with anesthesia/sedation. Patient denies any oxygen use at home. Patient denies taking any diet/weight loss medications or blood thinners. Patient is not being treated for MRSA or C-diff. EMMI education assisgned to the patient for the procedure, this was explained and instructions given to patient. COVID-19 screening test is on 11/23 Mon.at 940am, the pt is aware. Pt is aware that care partner will wait in the car during procedure; if they feel like they will be too hot or cold to wait in the car; they may wait in the 4 th floor lobby. Patient is aware to bring only one care partner. We want them to wear a mask (we do not have any that we can provide them), practice social distancing, and we will check their temperatures when they get here.  I did remind the patient that their care partner needs to stay in the parking lot the entire time and have a cell phone available, we will call them when the pt is ready for discharge. Patient will wear mask into building.

## 2018-12-07 ENCOUNTER — Ambulatory Visit: Payer: Medicare Other | Admitting: Podiatry

## 2018-12-07 ENCOUNTER — Encounter: Payer: Self-pay | Admitting: Podiatry

## 2018-12-07 ENCOUNTER — Other Ambulatory Visit: Payer: Self-pay

## 2018-12-07 ENCOUNTER — Ambulatory Visit (INDEPENDENT_AMBULATORY_CARE_PROVIDER_SITE_OTHER): Payer: Medicare Other

## 2018-12-07 DIAGNOSIS — M79674 Pain in right toe(s): Secondary | ICD-10-CM | POA: Diagnosis not present

## 2018-12-07 DIAGNOSIS — M84375A Stress fracture, left foot, initial encounter for fracture: Secondary | ICD-10-CM

## 2018-12-07 DIAGNOSIS — M79675 Pain in left toe(s): Secondary | ICD-10-CM | POA: Diagnosis not present

## 2018-12-07 DIAGNOSIS — B351 Tinea unguium: Secondary | ICD-10-CM

## 2018-12-07 DIAGNOSIS — E1151 Type 2 diabetes mellitus with diabetic peripheral angiopathy without gangrene: Secondary | ICD-10-CM

## 2018-12-07 DIAGNOSIS — M109 Gout, unspecified: Secondary | ICD-10-CM | POA: Diagnosis not present

## 2018-12-13 NOTE — Progress Notes (Signed)
Subjective: [name] is seen today for follow up painful, elongated, thickened toenails 1-5 b/l feet that she cannot cut. Pain interferes with daily activities. Aggravating factor includes wearing enclosed shoe gear and relieved with periodic debridement.  Pt states she was [name] ED with left foot pain and swelling. Xrays were performed. She was diagnosed with arthritis and inflammation.   Current Outpatient Medications on File Prior to Visit  Medication Sig  . amLODipine (NORVASC) 10 MG tablet Take 1 tablet (10 mg total) by mouth daily.  [name] aspirin 325 MG tablet Take 325 mg by mouth daily.  [name] atorvastatin (LIPITOR) 80 MG tablet TAKE 1 TABLET DAILY.  . bromocriptine (PARLODEL) 2.5 MG tablet Take 0.5 tablets (1.25 mg total) by mouth daily.  . calcitRIOL (ROCALTROL) 0.25 MCG capsule 1 (ONE) CAPSULE THREE TIMES A WEEK  . cloNIDine (CATAPRES) 0.2 MG tablet Take 1 tablet (0.2 mg total) by mouth 3 (three) times daily.  [name] etodolac (LODINE) 300 MG capsule Take 1 capsule (300 mg total) by mouth every 8 (eight) hours.  . furosemide (LASIX) 40 MG tablet Take 1 tablet (40 mg total) by mouth See admin instructions. Take 1 tablet daily, can take another pill if ankles are swelling -Hold for the next 3 days and resume on 09/13/2017  . glucose blood (ONETOUCH VERIO) test strip USE AS INSTRUCTED (TESTS 2 TIMES A DAY)  . metoprolol succinate (TOPROL-XL) 100 MG 24 hr tablet Take 1 tablet (100 mg total) by mouth daily. for high blood pressure  . minoxidil (LONITEN) 2.5 MG tablet Take 2.5 mg by mouth 2 (two) times daily.  . pantoprazole (PROTONIX) 40 MG tablet Take 1 tablet (40 mg total) by mouth daily.  . repaglinide (PRANDIN) 2 MG tablet Take 1 tablet (2 mg total) by mouth 3 (three) times daily before meals.  [name] telmisartan-hydrochlorothiazide (MICARDIS HCT) 80-25 MG tablet Take 1 tablet by mouth daily.  . vitamin B-12 (CYANOCOBALAMIN) 100 MCG tablet Take 1 tablet (100 mcg total) by mouth at bedtime.    No current facility-administered medications on file prior to visit.      No Known Allergies   Objective:  Vascular Examination: Capillary refill time <3 seconds b/l   Dorsalis pedis faintly palpable b/l.   Posterior tibial pulses nonpalpable b/l.  Digital hair absent b/l.  Skin temperature gradient WNL b/l.   Dermatological Examination: Skin thin, shiny and atrophic bl.  Toenails 1-5 b/l discolored, thick, dystrophic with subungual debris and pain with palpation to nailbeds due to thickness of nails.  Musculoskeletal: Muscle strength 5/5 to all LE muscle groups  No gross bony deformities b/l.  Pain noted along dorsal midfoot area b/l .  Neurological Examination: Protective sensation intact 5/5 with 10 gram monofilament bilaterally.  Xrays left foot: No gas in tissues Cortical thickening metatarsals 2, 3, 4, 5 left foot Plantar and posterior calcaneal heel spurs  Assessment: Painful onychomycosis toenails 1-5 b/l  Stress fracture metatarsals 2, 3, 4 5 left foot NIDDM with PAD  Plan: 1. Toenails 1-5 b/l were debrided in length and girth without iatrogenic bleeding. 2. Xrays of left foot taken and reviewed with [name] 3. Discussed stress fracture and treatment options. 4. Dispensed Darco shoe for left foot. She is to wear daily. We will perform follow up xrays in 2 weeks. 5. Patient to report any pedal injuries to medical professional immediately. 6. Follow up 2 weeks. 7. Patient/POA to call should there be a concern in the interim.

## 2018-12-17 ENCOUNTER — Ambulatory Visit (INDEPENDENT_AMBULATORY_CARE_PROVIDER_SITE_OTHER): Payer: Medicare Other

## 2018-12-17 ENCOUNTER — Other Ambulatory Visit: Payer: Self-pay | Admitting: Internal Medicine

## 2018-12-17 DIAGNOSIS — Z1159 Encounter for screening for other viral diseases: Secondary | ICD-10-CM | POA: Diagnosis not present

## 2018-12-17 LAB — SARS CORONAVIRUS 2 (TAT 6-24 HRS): SARS Coronavirus 2: NEGATIVE

## 2018-12-19 ENCOUNTER — Encounter: Payer: Self-pay | Admitting: Internal Medicine

## 2018-12-19 ENCOUNTER — Encounter (HOSPITAL_COMMUNITY): Payer: Medicare Other

## 2018-12-19 ENCOUNTER — Other Ambulatory Visit: Payer: Self-pay

## 2018-12-19 ENCOUNTER — Ambulatory Visit (AMBULATORY_SURGERY_CENTER): Payer: Medicare Other | Admitting: Internal Medicine

## 2018-12-19 VITALS — BP 156/51 | HR 64 | Temp 98.5°F | Resp 19 | Ht 62.0 in | Wt 144.0 lb

## 2018-12-19 DIAGNOSIS — K633 Ulcer of intestine: Secondary | ICD-10-CM

## 2018-12-19 DIAGNOSIS — Z1211 Encounter for screening for malignant neoplasm of colon: Secondary | ICD-10-CM

## 2018-12-19 DIAGNOSIS — K529 Noninfective gastroenteritis and colitis, unspecified: Secondary | ICD-10-CM | POA: Diagnosis not present

## 2018-12-19 MED ORDER — SODIUM CHLORIDE 0.9 % IV SOLN: 500.0000 mL | Freq: Once | INTRAVENOUS | Status: DC

## 2018-12-19 NOTE — Progress Notes (Signed)
Called to room to assist during endoscopic procedure.  Patient ID and intended procedure confirmed with present staff. Received instructions for my participation in the procedure from the performing physician.  

## 2018-12-19 NOTE — Progress Notes (Signed)
VS- Reynolds  Pt's states no medical or surgical changes since previsit or office visit.

## 2018-12-19 NOTE — Patient Instructions (Addendum)
I did not find any polyps or cancer.  There was an ulcer - raw surface of the lining at one spot. I took biopsies to check it but in my experience we see this sometinmes, probably from the cleanout and not a problem. I will let you know.  I appreciate the opportunity to care for you. Gatha Mayer, MD, FACG  YOU HAD AN ENDOSCOPIC PROCEDURE TODAY AT [facility]:   Refer to the procedure report that was given to you for any specific questions about what was found during the examination.  If the procedure report does not answer your questions, please call your gastroenterologist to clarify.  If you requested that your care partner not be given the details of your procedure findings, then the procedure report has been included in a sealed envelope for you to review at your convenience later.  YOU SHOULD EXPECT: Some feelings of bloating in the abdomen. Passage of more gas than usual.  Walking can help get rid of the air that was put into your GI tract during the procedure and reduce the bloating. If you had a lower endoscopy (such as a colonoscopy or flexible sigmoidoscopy) you may notice spotting of blood in your stool or on the toilet paper. If you underwent a bowel prep for your procedure, you may not have a normal bowel movement for a few days.  Please Note:  You might notice some irritation and congestion in your nose or some drainage.  This is from the oxygen used during your procedure.  There is no need for concern and it should clear up in a day or so.  SYMPTOMS TO REPORT IMMEDIATELY:   Following lower endoscopy (colonoscopy or flexible sigmoidoscopy):  Excessive amounts of blood in the stool  Significant tenderness or worsening of abdominal pains  Swelling of the abdomen that is new, acute  Fever of 100F or higher  For urgent or emergent issues, a gastroenterologist can be reached at any hour by calling [phone].   DIET:  We do recommend a small meal at first,  but then you may proceed to your regular diet.  Drink plenty of fluids but you should avoid alcoholic beverages for 24 hours.  ACTIVITY:  You should plan to take it easy for the rest of today and you should NOT DRIVE or use heavy machinery until tomorrow (because of the sedation medicines used during the test).    FOLLOW UP: Our staff will call the number listed on your records 48-72 hours following your procedure to check on you and address any questions or concerns that you may have regarding the information given to you following your procedure. If we do not reach you, we will leave a message.  We will attempt to reach you two times.  During this call, we will ask if you have developed any symptoms of COVID 19. If you develop any symptoms (ie: fever, flu-like symptoms, shortness of breath, cough etc.) before then, please call [phone].  If you test positive for Covid 19 in the 2 weeks post procedure, please call and report this information to [place].    If any biopsies were taken you will be contacted by phone or by letter within the next 1-3 weeks.  Please call us at [phone] if you have not heard about the biopsies in 3 weeks.    SIGNATURES/CONFIDENTIALITY: You and/or your care partner have signed paperwork which will be entered into your electronic medical record.  These signatures  attest to the fact that that the information above on your After Visit Summary has been reviewed and is understood.  Full responsibility of the confidentiality of this discharge information lies with you and/or your care-partner.

## 2018-12-19 NOTE — Progress Notes (Signed)
To PACU, VSS. Report to [email or web address] 

## 2018-12-19 NOTE — Op Note (Signed)
[facility] Patient Name: [name] Procedure Date: 12/19/2018 8:03 AM MRN: [id number] Endoscopist: Gatha Mayer , MD Age: 68 Referring MD:  Date of Birth: 01-Jul-1950 Gender: Female Account #: [id number] Procedure:                Colonoscopy Indications:              Screening for colorectal malignant neoplasm, Last                            colonoscopy: 2010 Medicines:                Propofol per Anesthesia, Monitored Anesthesia Care Procedure:                Pre-Anesthesia Assessment:                           - Prior to the procedure, a History and Physical                            was performed, and patient medications and                            allergies were reviewed. The patient's tolerance of                            previous anesthesia was also reviewed. The risks                            and benefits of the procedure and the sedation                            options and risks were discussed with the patient.                            All questions were answered, and informed consent                            was obtained. Prior Anticoagulants: The patient has                            taken no previous anticoagulant or antiplatelet                            agents. ASA Grade Assessment: II - A patient with                            mild systemic disease. After reviewing the risks                            and benefits, the patient was deemed in                            satisfactory condition to undergo the procedure.  After obtaining informed consent, the colonoscope                            was passed under direct vision. Throughout the                            procedure, the patient's blood pressure, pulse, and                            oxygen saturations were monitored continuously. The                            Colonoscope was introduced through the anus and                            advanced to the  the terminal ileum, with                            identification of the appendiceal orifice and IC                            valve. The colonoscopy was performed without                            difficulty. The patient tolerated the procedure                            well. The quality of the bowel preparation was                            good. The ileocecal valve, appendiceal orifice, and                            rectum were photographed. The bowel preparation                            used was Miralax via split dose instruction. Scope In: 8:08:38 AM Scope Out: 8:27:16 AM Scope Withdrawal Time: 0 hours 15 minutes 29 seconds  Total Procedure Duration: 0 hours 18 minutes 38 seconds  Findings:                 The perianal and digital rectal examinations were                            normal.                           A single (solitary) ulcer was found at the                            ileocecal valve. No bleeding was present. Biopsies                            were taken with a cold forceps for histology.  Verification of patient identification for the                            specimen was done. Estimated blood loss was minimal.                           The terminal ileum appeared normal. Estimated blood                            loss: none.                           Anal papilla(e) were hypertrophied.                           The exam was otherwise without abnormality on                            direct and retroflexion views. Complications:            No immediate complications. Estimated Blood Loss:     Estimated blood loss was minimal. Impression:               - A single (solitary) ulcer at the ileocecal valve.                            Biopsied.                           - The examined portion of the ileum was normal.                           - Anal papilla(e) were hypertrophied.                           - The examination was  otherwise normal on direct                            and retroflexion views. Recommendation:           - Patient has a contact number available for                            emergencies. The signs and symptoms of potential                            delayed complications were discussed with the                            patient. Return to normal activities tomorrow.                            Written discharge instructions were provided to the                            patient.                           -  Resume previous diet.                           - No recommendation at this time regarding repeat                            colonoscopy.                           - Await pathology results. Gatha Mayer, MD 12/19/2018 8:35:05 AM This report has been signed electronically.

## 2018-12-24 ENCOUNTER — Ambulatory Visit (HOSPITAL_COMMUNITY)
Admission: RE | Admit: 2018-12-24 | Discharge: 2018-12-24 | Disposition: A | Discharge status: Home / Self Care | Payer: Medicare Other | Source: Ambulatory Visit | Attending: Nephrology | Admitting: Nephrology

## 2018-12-24 ENCOUNTER — Other Ambulatory Visit: Payer: Self-pay

## 2018-12-24 VITALS — BP 139/59 | HR 55 | Temp 97.2°F | Resp 16 | Ht 62.0 in | Wt 142.0 lb

## 2018-12-24 DIAGNOSIS — D631 Anemia in chronic kidney disease: Secondary | ICD-10-CM | POA: Diagnosis not present

## 2018-12-24 DIAGNOSIS — N183 Chronic kidney disease, stage 3 unspecified: Secondary | ICD-10-CM | POA: Insufficient documentation

## 2018-12-24 LAB — IRON AND TIBC
Iron: 35 ug/dL (ref 28–170)
Saturation Ratios: 13 % (ref 10.4–31.8)
TIBC: 279 ug/dL (ref 250–450)
UIBC: 244 ug/dL

## 2018-12-24 LAB — POCT HEMOGLOBIN-HEMACUE: Hemoglobin: 9.1 g/dL — ABNORMAL LOW (ref 12.0–15.0)

## 2018-12-24 LAB — FERRITIN: Ferritin: 39 ng/mL (ref 11–307)

## 2018-12-24 MED ORDER — EPOETIN ALFA-EPBX 40000 UNIT/ML IJ SOLN
40000.0000 [IU] | INTRAMUSCULAR | Status: DC
  Administered 2018-12-24: 40000 [IU] via SUBCUTANEOUS
  Filled 2018-12-24: qty 1

## 2018-12-25 ENCOUNTER — Telehealth: Payer: Self-pay | Admitting: *Deleted

## 2018-12-25 ENCOUNTER — Telehealth: Payer: Self-pay

## 2018-12-25 DIAGNOSIS — K633 Ulcer of intestine: Secondary | ICD-10-CM

## 2018-12-25 HISTORY — DX: Ulcer of intestine: K63.3

## 2018-12-25 NOTE — Telephone Encounter (Signed)
  Follow up Call-  Call back number 12/19/2018  Post procedure Call Back phone  # [phone]  Permission to leave phone message No  comments no voicemail  Some recent data might be hidden    No voicemail No answer Unable to leave message

## 2018-12-25 NOTE — Telephone Encounter (Signed)
  Follow up Call-  Call back number 12/19/2018  Post procedure Call Back phone  # [phone]  Permission to leave phone message No  comments no voicemail  Some recent data might be hidden     Patient questions:  Do you have a fever, pain , or abdominal swelling? No. Pain Score  0 *  Have you tolerated food without any problems? Yes.    Have you been able to return to your normal activities? Yes.    Do you have any questions about your discharge instructions: Diet   No. Medications  No. Follow up visit  No.  Do you have questions or concerns about your Care? No.  Actions: * If pain score is 4 or above: No action needed, pain <4.  1. Have you developed a fever since your procedure? no  2.   Have you had an respiratory symptoms (SOB or cough) since your procedure? no  3.   Have you tested positive for COVID 19 since your procedure no  4.   Have you had any family members/close contacts diagnosed with the COVID 19 since your procedure?  no   If yes to any of these questions please route to Joylene John, RN and Alphonsa Gin, Therapist, sports.

## 2018-12-26 ENCOUNTER — Ambulatory Visit (INDEPENDENT_AMBULATORY_CARE_PROVIDER_SITE_OTHER): Payer: Medicare Other

## 2018-12-26 ENCOUNTER — Ambulatory Visit: Payer: Medicare Other | Admitting: Podiatry

## 2018-12-26 ENCOUNTER — Encounter: Payer: Self-pay | Admitting: Podiatry

## 2018-12-26 ENCOUNTER — Other Ambulatory Visit: Payer: Self-pay

## 2018-12-26 DIAGNOSIS — M84375A Stress fracture, left foot, initial encounter for fracture: Secondary | ICD-10-CM

## 2018-12-26 DIAGNOSIS — E1151 Type 2 diabetes mellitus with diabetic peripheral angiopathy without gangrene: Secondary | ICD-10-CM | POA: Diagnosis not present

## 2018-12-26 NOTE — Progress Notes (Signed)
Subjective:  Patient ID: [name], female    DOB: 06/21/1950,  MRN: [id number]  Chief Complaint  Patient presents with  . Foot Injury    pt is here for a fracture f/u of the left foot, pt states that her left foot is doing a lot better since the last time she was here, pt also states that her foot pain is a 2 out of 72    68 y.o. female presents with the above complaint.  Patient presents with a follow-up of a stress fracture that was diagnosed by Dr. Elisha Ponder.  Patient states her pain has completely resolved.  She is well-known to our practice and is under the care of Dr. Elisha Ponder.  She was seen by me for a follow-up on evaluation of any surgical intervention.  She has been ambulating with her surgical shoe which was dispensed by our practice.  She denies any other acute complaints.  She states her pain has completely resolved.   Review of Systems: Negative except as noted in the HPI. Denies N/V/F/Ch.  Past Medical History:  Diagnosis Date  . Acute kidney injury ([place]) 01/08/2015  . Arthritis   . Chronic back pain   . Constipation   . Cough   . Diabetes mellitus, type 2 ([place])   . Diverticulosis   . Fibroid    patient thinks this was the reason for her hysterectomy  . GERD (gastroesophageal reflux disease)   . Heart murmur   . History of sebaceous cyst   . Hyperlipemia   . Hyperplastic colon polyp   . Hypertension   . Hyperthyroidism   . Lumbar radiculopathy   . Shortness of breath 09/13/2013  . Stroke ([place]) 2015?   x4   . Tobacco abuse     Current Outpatient Medications:  .  amLODipine (NORVASC) 10 MG tablet, Take 1 tablet (10 mg total) by mouth daily., Disp: 90 tablet, Rfl: 3 .  aspirin 325 MG tablet, Take 325 mg by mouth daily., Disp: , Rfl:  .  atorvastatin (LIPITOR) 80 MG tablet, TAKE 1 TABLET DAILY., Disp: 90 tablet, Rfl: 2 .  bromocriptine (PARLODEL) 2.5 MG tablet, Take 0.5 tablets (1.25 mg total) by mouth daily., Disp: 45 tablet, Rfl: 2 .  calcitRIOL  (ROCALTROL) 0.25 MCG capsule, 1 (ONE) CAPSULE THREE TIMES A WEEK, Disp: , Rfl:  .  cloNIDine (CATAPRES) 0.2 MG tablet, Take 1 tablet (0.2 mg total) by mouth 3 (three) times daily., Disp: 90 tablet, Rfl: 0 .  etodolac (LODINE) 300 MG capsule, Take 1 capsule (300 mg total) by mouth every 8 (eight) hours., Disp: 21 capsule, Rfl: 0 .  furosemide (LASIX) 40 MG tablet, Take 1 tablet (40 mg total) by mouth See admin instructions. Take 1 tablet daily, can take another pill if ankles are swelling -Hold for the next 3 days and resume on 09/13/2017, Disp: 30 tablet, Rfl:  .  glucose blood (ONETOUCH VERIO) test strip, USE AS INSTRUCTED (TESTS 2 TIMES A DAY), Disp: 100 each, Rfl: 5 .  metoprolol succinate (TOPROL-XL) 100 MG 24 hr tablet, Take 1 tablet (100 mg total) by mouth daily. for high blood pressure, Disp: 90 tablet, Rfl: 1 .  minoxidil (LONITEN) 2.5 MG tablet, Take 2.5 mg by mouth 2 (two) times daily., Disp: , Rfl:  .  pantoprazole (PROTONIX) 40 MG tablet, Take 1 tablet (40 mg total) by mouth daily., Disp: 90 tablet, Rfl: 2 .  repaglinide (PRANDIN) 2 MG tablet, Take 1 tablet (2 mg total) by mouth  3 (three) times daily before meals., Disp: 90 tablet, Rfl: 11 .  telmisartan-hydrochlorothiazide (MICARDIS HCT) 80-25 MG tablet, Take 1 tablet by mouth daily., Disp: 90 tablet, Rfl: 1 .  vitamin B-12 (CYANOCOBALAMIN) 100 MCG tablet, Take 1 tablet (100 mcg total) by mouth at bedtime., Disp: 90 tablet, Rfl: 1  Social History   Tobacco Use  Smoking Status Former Smoker  . Packs/day: 0.50  . Years: 35.00  . Pack years: 17.50  . Types: Cigarettes  . Quit date: 07/07/2014  . Years since quitting: 4.4  Smokeless Tobacco Never Used    No Known Allergies Objective:  There were no vitals filed for this visit. There is no height or weight on file to calculate BMI. Constitutional Well developed. Well nourished.  Vascular Dorsalis pedis pulses palpable bilaterally. Posterior tibial pulses palpable bilaterally.  Capillary refill normal to all digits.  No cyanosis or clubbing noted. Pedal hair growth normal.  Neurologic Normal speech. Oriented to person, place, and time. Epicritic sensation to light touch grossly present bilaterally.  Dermatologic Nails well groomed and normal in appearance. No open wounds. No skin lesions.  Orthopedic:  No pain on palpation to the Lisfranc interval or dorsal aspect of the foot. No pain with range of motion of the digits 1 through 5 active and passive.  No pain with range of motion of the Lisfranc interval.     Radiographs: 3 views of skeletally mature adult foot: Cortical thickening noted of the metatarsal 2 through 5 likely due to periosteal reaction was secondary to stress fracture. Lisfranc interval is maintained.  Arthritic changes noted at the midfoot ankle joint and subtalar joint.  Heel spurs noted plantarly and posteriorly. Assessment:   1. Stress fracture of left foot, initial encounter    Plan:  Patient was evaluated and treated and all questions answered.  Left foot stress fracture of metatarsal 2 through 5 with periosteal reaction -Given that her pain has completely resolved, there is no need for surgical intervention at this time.  She has been ambulating with her flat shoe which has considerably helped decrease her pain.  She can now self transition her out of flat shoe into regular sneakers at this time. -Patient will follow up with Dr. Adah Perl for routine foot care. -This is now resolved.  I explained to the patient if any issues arises come back and see me.  No follow-ups on file.

## 2018-12-30 ENCOUNTER — Encounter: Payer: Self-pay | Admitting: Internal Medicine

## 2018-12-30 NOTE — Progress Notes (Signed)
Benign IC valve ulcer No recall needed

## 2019-01-04 ENCOUNTER — Other Ambulatory Visit: Payer: Self-pay

## 2019-01-08 ENCOUNTER — Ambulatory Visit (INDEPENDENT_AMBULATORY_CARE_PROVIDER_SITE_OTHER): Payer: Medicare Other | Admitting: Endocrinology

## 2019-01-08 ENCOUNTER — Other Ambulatory Visit: Payer: Self-pay

## 2019-01-08 ENCOUNTER — Encounter: Payer: Self-pay | Admitting: Endocrinology

## 2019-01-08 VITALS — BP 140/84 | HR 78 | Ht 62.0 in | Wt 145.2 lb

## 2019-01-08 DIAGNOSIS — I10 Essential (primary) hypertension: Secondary | ICD-10-CM

## 2019-01-08 DIAGNOSIS — E113599 Type 2 diabetes mellitus with proliferative diabetic retinopathy without macular edema, unspecified eye: Secondary | ICD-10-CM | POA: Diagnosis not present

## 2019-01-08 DIAGNOSIS — R609 Edema, unspecified: Secondary | ICD-10-CM | POA: Diagnosis not present

## 2019-01-08 DIAGNOSIS — E1165 Type 2 diabetes mellitus with hyperglycemia: Secondary | ICD-10-CM

## 2019-01-08 DIAGNOSIS — E1129 Type 2 diabetes mellitus with other diabetic kidney complication: Secondary | ICD-10-CM

## 2019-01-08 LAB — POCT GLYCOSYLATED HEMOGLOBIN (HGB A1C): Hemoglobin A1C: 6.4 % — AB (ref 4.0–5.6)

## 2019-01-08 NOTE — Progress Notes (Signed)
Subjective:    Patient ID: [name], female    DOB: 1951/01/08, 68 y.o.   MRN: [id number]  HPI Pt returns for f/u of diabetes mellitus:  DM type: 2, but she may be developing type 1 Dx'ed: 0160 Complications: polyneuropathy, renal insuff, CVA's, and PDR.  Therapy: 2 oral meds GDM: never DKA: never Severe hypoglycemia: never.   Pancreatitis: never Pancreatic imaging: never Other: she cannot afford brand-name meds; she has never been on insulin, but she has taken trulicity; renal insuff limits metformin dosage; edema limits rx options.   Interval history:  no cbg record, but states cbg's are well-controlled.  pt states she feels well in general.   Past Medical History:  Diagnosis Date  . Acute kidney injury ([place]) 01/08/2015  . Arthritis   . Chronic back pain   . Constipation   . Cough   . Diabetes mellitus, type 2 ([place])   . Diverticulosis   . Fibroid    patient thinks this was the reason for her hysterectomy  . GERD (gastroesophageal reflux disease)   . Heart murmur   . History of sebaceous cyst   . Hyperlipemia   . Hyperplastic colon polyp   . Hypertension   . Hyperthyroidism   . Lumbar radiculopathy   . Shortness of breath 09/13/2013  . Stroke ([place]) 2015?   x4   . Tobacco abuse   . 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 ABDOMINAL HYSTERECTOMY     --?ovaries remain  . COLONOSCOPY W/ BIOPSIES  09/11/2008  . RETINOPATHY SURGERY Bilateral 2013   Dr. Zigmund Daniel    Social History   Socioeconomic History  . Marital status: Divorced    Spouse name: Not on file  . Number of children: 1  . Years of education: 77  . Highest education level: Not on file  Occupational History  . 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 Smoking status: Former Smoker    Packs/day: 0.50    Years: 35.00    Pack years: 17.50    Types:  Cigarettes    Quit date: 07/07/2014    Years since quitting: 4.5  . Smokeless tobacco: Never Used  Substance and Sexual Activity  . Alcohol use: No  . Drug use: No  . Sexual activity: Never    Partners: Male    Birth control/protection: Surgical    Comment: TAH--?ovaries remain  Other Topics Concern  . Not on file  Social History Narrative   Divorced 1 son    Retired Museum/gallery curator for [employer]   Former smoker no alcohol caffeine or drug use report denies abuse and feels safe at home.   Social Determinants of Health   Financial Resource Strain:   . Difficulty of Paying Living Expenses: Not on file  Food Insecurity:   . Worried About Charity fundraiser in the Last Year: Not on file  . Ran Out of Food in the Last Year: Not on file  Transportation Needs:   . Lack of Transportation (Medical): Not on file  . Lack of Transportation (Non-Medical): Not on file  Physical Activity: Unknown  . Days of Exercise per Week: Not on file  . Minutes of Exercise per Session: 40 min  Stress:   . Feeling of Stress : Not on file  Social Connections:   .  Frequency of Communication with Friends and Family: Not on file  . Frequency of Social Gatherings with Friends and Family: Not on file  . Attends Religious Services: Not on file  . Active Member of Clubs or Organizations: Not on file  . Attends Archivist Meetings: Not on file  . Marital Status: Not on file  Intimate Partner Violence:   . Fear of Current or Ex-Partner: Not on file  . Emotionally Abused: Not on file  . Physically Abused: Not on file  . Sexually Abused: Not on file    Current Outpatient Medications on File Prior to Visit  Medication Sig Dispense Refill  . amLODipine (NORVASC) 10 MG tablet Take 1 tablet (10 mg total) by mouth daily. 90 tablet 3  . aspirin 325 MG tablet Take 325 mg by mouth daily.    [name] atorvastatin (LIPITOR) 80 MG tablet TAKE 1 TABLET DAILY. 90 tablet 2  . bromocriptine (PARLODEL) 2.5 MG  tablet Take 0.5 tablets (1.25 mg total) by mouth daily. 45 tablet 2  . calcitRIOL (ROCALTROL) 0.25 MCG capsule 1 (ONE) CAPSULE THREE TIMES A WEEK    . cloNIDine (CATAPRES) 0.2 MG tablet Take 1 tablet (0.2 mg total) by mouth 3 (three) times daily. 90 tablet 0  . etodolac (LODINE) 300 MG capsule Take 1 capsule (300 mg total) by mouth every 8 (eight) hours. 21 capsule 0  . furosemide (LASIX) 40 MG tablet Take 1 tablet (40 mg total) by mouth See admin instructions. Take 1 tablet daily, can take another pill if ankles are swelling -Hold for the next 3 days and resume on 09/13/2017 30 tablet   . glucose blood (ONETOUCH VERIO) test strip USE AS INSTRUCTED (TESTS 2 TIMES A DAY) 100 each 5  . metoprolol succinate (TOPROL-XL) 100 MG 24 hr tablet Take 1 tablet (100 mg total) by mouth daily. for high blood pressure 90 tablet 1  . minoxidil (LONITEN) 2.5 MG tablet Take 2.5 mg by mouth 2 (two) times daily.    . pantoprazole (PROTONIX) 40 MG tablet Take 1 tablet (40 mg total) by mouth daily. 90 tablet 2  . repaglinide (PRANDIN) 2 MG tablet Take 1 tablet (2 mg total) by mouth 3 (three) times daily before meals. 90 tablet 11  . telmisartan-hydrochlorothiazide (MICARDIS HCT) 80-25 MG tablet Take 1 tablet by mouth daily. 90 tablet 1  . vitamin B-12 (CYANOCOBALAMIN) 100 MCG tablet Take 1 tablet (100 mcg total) by mouth at bedtime. 90 tablet 1   No current facility-administered medications on file prior to visit.    No Known Allergies  Family History  Problem Relation Age of Onset  . Hypertension Mother   . Sleep apnea Mother   . Diabetes Mother   . Hyperlipidemia Mother   . Lung cancer Father        smoker  . Hypertension Sister   . Diabetes Sister   . Hypertension Brother   . Hypertension Sister   . Diabetes Brother   . Hypertension Brother   . Breast cancer Neg Hx   . Colon cancer Neg Hx   . Esophageal cancer Neg Hx   . Pancreatic cancer Neg Hx   . Liver disease Neg Hx   . Colon polyps Neg Hx      BP 140/84 (BP Location: Right Arm, Patient Position: Sitting, Cuff Size: Normal)   Pulse 78   Ht 5\' 2"  (1.575 m)   Wt 145 lb 3.2 oz (65.9 kg)   BMI 26.56 kg/m  Review of Systems She denies hypoglycemia.      Objective:   Physical Exam VITAL SIGNS:  See vs page GENERAL: no distress Pulses: dorsalis pedis intact bilat.   MSK: no deformity of the feet CV: 1+ bilat leg edema Skin:  no ulcer on the feet.  normal color and temp on the feet. Neuro: sensation is intact to touch on the feet Ext: there is bilateral onychomycosis of the toenails  Lab Results  Component Value Date   HGBA1C 6.4 (A) 01/08/2019   Lab Results  Component Value Date   CREATININE 2.28 (H) 07/11/2018   BUN 97 (HH) 07/11/2018   NA 132 (L) 07/11/2018   K 3.5 07/11/2018   CL 95 (L) 07/11/2018   CO2 28 07/11/2018        Assessment & Plan:  Type 2 DM, with PDR: well-controlled Renal insuff: This limits rx options HTN: is noted today Edema: This also limits rx options   Patient Instructions  Please continue the same medications check your blood sugar once a day.  vary the time of day when you check, between before the 3 meals, and at bedtime.  also check if you have symptoms of your blood sugar being too high or too low.  please keep a record of the readings and bring it to your next appointment here (or you can bring the meter itself).  You can write it on any piece of paper.  please call us sooner if your blood sugar goes below 70, or if you have a lot of readings over 200.   Please follow up with your kidney doctor, for the blood pressure.   Please come back for a follow-up appointment in 3 months.

## 2019-01-08 NOTE — Patient Instructions (Addendum)
Please continue the same medications check your blood sugar once a day.  vary the time of day when you check, between before the 3 meals, and at bedtime.  also check if you have symptoms of your blood sugar being too high or too low.  please keep a record of the readings and bring it to your next appointment here (or you can bring the meter itself).  You can write it on any piece of paper.  please call us sooner if your blood sugar goes below 70, or if you have a lot of readings over 200.   Please follow up with your kidney doctor, for the blood pressure.   Please come back for a follow-up appointment in 3 months.

## 2019-01-17 ENCOUNTER — Other Ambulatory Visit (HOSPITAL_COMMUNITY): Payer: Self-pay | Admitting: *Deleted

## 2019-01-21 ENCOUNTER — Ambulatory Visit (HOSPITAL_COMMUNITY)
Admission: RE | Admit: 2019-01-21 | Discharge: 2019-01-21 | Disposition: A | Discharge status: Home / Self Care | Payer: Medicare Other | Source: Ambulatory Visit | Attending: Nephrology | Admitting: Nephrology

## 2019-01-21 ENCOUNTER — Other Ambulatory Visit: Payer: Self-pay

## 2019-01-21 VITALS — BP 156/55 | HR 62 | Temp 97.8°F | Resp 20 | Ht 62.0 in | Wt 143.0 lb

## 2019-01-21 DIAGNOSIS — N183 Chronic kidney disease, stage 3 unspecified: Secondary | ICD-10-CM | POA: Insufficient documentation

## 2019-01-21 DIAGNOSIS — D631 Anemia in chronic kidney disease: Secondary | ICD-10-CM | POA: Diagnosis not present

## 2019-01-21 LAB — FERRITIN: Ferritin: 117 ng/mL (ref 11–307)

## 2019-01-21 LAB — IRON AND TIBC
Iron: 23 ug/dL — ABNORMAL LOW (ref 28–170)
Saturation Ratios: 9 % — ABNORMAL LOW (ref 10.4–31.8)
TIBC: 255 ug/dL (ref 250–450)
UIBC: 232 ug/dL

## 2019-01-21 LAB — POCT HEMOGLOBIN-HEMACUE: Hemoglobin: 8.6 g/dL — ABNORMAL LOW (ref 12.0–15.0)

## 2019-01-21 MED ORDER — EPOETIN ALFA-EPBX 40000 UNIT/ML IJ SOLN
40000.0000 [IU] | INTRAMUSCULAR | Status: DC
  Administered 2019-01-21: 40000 [IU] via SUBCUTANEOUS

## 2019-01-21 MED ORDER — SODIUM CHLORIDE 0.9 % IV SOLN
510.0000 mg | Freq: Once | INTRAVENOUS | Status: AC
  Administered 2019-01-21: 510 mg via INTRAVENOUS
  Filled 2019-01-21: qty 17

## 2019-01-21 MED ORDER — EPOETIN ALFA-EPBX 40000 UNIT/ML IJ SOLN
INTRAMUSCULAR | Status: AC
  Filled 2019-01-21: qty 1

## 2019-01-29 ENCOUNTER — Telehealth: Payer: Self-pay

## 2019-01-29 NOTE — Telephone Encounter (Signed)
Pt called giving verbal permission for her niece, Waunita Schooner, to pick up her paperwork the provider filled out for her.

## 2019-02-04 ENCOUNTER — Other Ambulatory Visit: Payer: Self-pay

## 2019-02-04 ENCOUNTER — Ambulatory Visit (INDEPENDENT_AMBULATORY_CARE_PROVIDER_SITE_OTHER): Payer: Medicare Other | Admitting: Family

## 2019-02-04 ENCOUNTER — Encounter: Payer: Self-pay | Admitting: Family

## 2019-02-04 VITALS — BP 130/68 | HR 60 | Temp 97.6°F | Ht 62.0 in | Wt 142.4 lb

## 2019-02-04 DIAGNOSIS — N184 Chronic kidney disease, stage 4 (severe): Secondary | ICD-10-CM

## 2019-02-04 DIAGNOSIS — I10 Essential (primary) hypertension: Secondary | ICD-10-CM | POA: Diagnosis not present

## 2019-02-04 DIAGNOSIS — E1165 Type 2 diabetes mellitus with hyperglycemia: Secondary | ICD-10-CM

## 2019-02-04 DIAGNOSIS — E785 Hyperlipidemia, unspecified: Secondary | ICD-10-CM

## 2019-02-04 DIAGNOSIS — D631 Anemia in chronic kidney disease: Secondary | ICD-10-CM

## 2019-02-04 NOTE — Progress Notes (Signed)
[name] is a 69 y.o. female with the following history as recorded in Epic[facility]:  Patient Active Problem List   Diagnosis Date Noted  . Vitamin D toxicity, accidental or unintentional, initial encounter 09/09/2017  . Ataxia 09/09/2017  . Type 2 diabetes mellitus with hyperglycemia ([place]) 09/09/2017  . Chronic kidney disease (CKD)   . Olecranon bursitis of right elbow 03/09/2017  . Thyroid nodule 06/29/2016  . Type 2 diabetes mellitus ([place]) 06/13/2016  . Vitamin D deficiency 01/12/2016  . Medicare annual wellness visit, initial 01/11/2016  . Lacunar infarct, acute ([place]) 03/23/2015  . Non compliance w medication regimen 01/16/2015  . Anemia 01/08/2015  . Stroke ([place]) 01/08/2015  . Hyperlipemia   . Carotid bruit present 12/13/2012  . Routine health maintenance 10/24/2010  . PERIPHERAL EDEMA 09/10/2009  . ELECTROCARDIOGRAM, ABNORMAL 04/11/2008  . HEART MURMUR, SYSTOLIC 46/50/3546  . SEBACEOUS CYST, NECK 12/13/2006  . Hyperthyroidism 09/27/2006  . Secondary diabetes with peripheral vascular disease ([place]) 09/27/2006  . Hyperlipidemia LDL goal <70 09/27/2006  . TOBACCO ABUSE 09/27/2006  . Essential hypertension 09/27/2006    Current Outpatient Medications  Medication Sig Dispense Refill  . amLODipine (NORVASC) 10 MG tablet Take 1 tablet (10 mg total) by mouth daily. 90 tablet 3  . aspirin 325 MG tablet Take 325 mg by mouth daily.    [name] atorvastatin (LIPITOR) 80 MG tablet TAKE 1 TABLET DAILY. 90 tablet 2  . bromocriptine (PARLODEL) 2.5 MG tablet Take 0.5 tablets (1.25 mg total) by mouth daily. 45 tablet 2  . calcitRIOL (ROCALTROL) 0.25 MCG capsule 1 (ONE) CAPSULE THREE TIMES A WEEK    . cloNIDine (CATAPRES) 0.2 MG tablet Take 1 tablet (0.2 mg total) by mouth 3 (three) times daily. 90 tablet 0  . glucose blood (ONETOUCH VERIO) test strip USE AS INSTRUCTED (TESTS 2 TIMES A DAY) 100 each 5  . metoprolol succinate (TOPROL-XL) 100 MG 24 hr tablet Take 1 tablet (100 mg total) by  mouth daily. for high blood pressure 90 tablet 1  . minoxidil (LONITEN) 2.5 MG tablet Take 2.5 mg by mouth 2 (two) times daily.    . pantoprazole (PROTONIX) 40 MG tablet Take 1 tablet (40 mg total) by mouth daily. 90 tablet 2  . repaglinide (PRANDIN) 2 MG tablet Take 1 tablet (2 mg total) by mouth 3 (three) times daily before meals. 90 tablet 11  . telmisartan-hydrochlorothiazide (MICARDIS HCT) 80-25 MG tablet Take 1 tablet by mouth daily. 90 tablet 1  . vitamin B-12 (CYANOCOBALAMIN) 100 MCG tablet Take 1 tablet (100 mcg total) by mouth at bedtime. 90 tablet 1  . furosemide (LASIX) 40 MG tablet Take 1 tablet (40 mg total) by mouth See admin instructions. Take 1 tablet daily, can take another pill if ankles are swelling -Hold for the next 3 days and resume on 09/13/2017 (Patient not taking: Reported on 02/04/2019) 30 tablet    No current facility-administered medications for this visit.    Allergies: Patient has no known allergies.  Past Medical History:  Diagnosis Date  . Acute kidney injury ([place]) 01/08/2015  . Arthritis   . Chronic back pain   . Constipation   . Cough   . Diabetes mellitus, type 2 ([place])   . Diverticulosis   . Fibroid    patient thinks this was the reason for her hysterectomy  . GERD (gastroesophageal reflux disease)   . Heart murmur   . History of sebaceous cyst   . Hyperlipemia   . Hyperplastic colon polyp   .  Hypertension   . Hyperthyroidism   . Lumbar radiculopathy   . Shortness of breath 09/13/2013  . Stroke ([place]) 2015?   x4   . Tobacco abuse   . 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 ABDOMINAL HYSTERECTOMY     --?ovaries remain  . COLONOSCOPY W/ BIOPSIES  09/11/2008  . RETINOPATHY SURGERY Bilateral 2013   Dr. Zigmund Daniel    Family History  Problem Relation Age of Onset  . Hypertension Mother   . Sleep apnea Mother   . Diabetes Mother   . Hyperlipidemia Mother   .  Lung cancer Father        smoker  . Hypertension Sister   . Diabetes Sister   . Hypertension Brother   . Hypertension Sister   . Diabetes Brother   . Hypertension Brother   . Breast cancer Neg Hx   . Colon cancer Neg Hx   . Esophageal cancer Neg Hx   . Pancreatic cancer Neg Hx   . Liver disease Neg Hx   . Colon polyps Neg Hx     Social History   Tobacco Use  . Smoking status: Former Smoker    Packs/day: 0.50    Years: 35.00    Pack years: 17.50    Types: Cigarettes    Quit date: 07/07/2014    Years since quitting: 4.5  . Smokeless tobacco: Never Used  Substance Use Topics  . Alcohol use: No    Subjective:  6 month follow-up on chronic care needs including: 1) hypertension/ CKD; 2) hyperlipdemia; 3) Type 2 Diabetes- last Hgba1c at 6.4; 4) chronic anemia- now taking monthly injections; due for another injection in 2 weeks; In general, feeling good with no acute concerns; would like information for setting up Living Will/ personal will;    Objective:  Vitals:   02/04/19 1031  BP: 130/68  Pulse: 60  Temp: 97.6 F (36.4 C)  TempSrc: Oral  SpO2: 99%  Weight: 142 lb 6.4 oz (64.6 kg)  Height: 5\' 2"  (1.575 m)    General: Well developed, well nourished, in no acute distress  Skin : Warm and dry.  Head: Normocephalic and atraumatic  Lungs: Respirations unlabored; clear to auscultation bilaterally without wheeze, rales, rhonchi  [facility] exam: normal rate and regular rhythm.  Neurologic: Alert and oriented; speech intact; face symmetrical; moves all extremities well; CNII-XII intact without focal deficit   Assessment:  1. Essential hypertension   2. Hyperlipidemia LDL goal <70   3. Type 2 diabetes mellitus with hyperglycemia, without long-term current use of insulin (HCC)   4. Stage 4 chronic kidney disease ([place])   5. Anemia due to stage 4 chronic kidney disease ([place])     Plan:  1. Stable; continue same medications; nephrology managing majority of prescriptions; patient  will call if she needs refills; 2. Stable; lipid panel checked in June 2020; 3. Most recent Hgba1c very good; continue with endocrine; 4. Continue with nephrology; 5. Continue regular iron infusions; will not update labs today as being managed by specialist;  Paperwork for Advanced Directives/ Living Will provided to patient; she will contact attorney to help set up personal will.   This visit occurred during the SARS-CoV-2 public health emergency.  Safety protocols were in place, including screening questions prior to the visit, additional usage of staff PPE, and extensive cleaning of exam room while observing appropriate contact time as indicated for disinfecting  solutions.    No follow-ups on file.  No orders of the defined types were placed in this encounter.   Requested Prescriptions    No prescriptions requested or ordered in this encounter

## 2019-02-11 DIAGNOSIS — D631 Anemia in chronic kidney disease: Secondary | ICD-10-CM | POA: Diagnosis not present

## 2019-02-11 DIAGNOSIS — N189 Chronic kidney disease, unspecified: Secondary | ICD-10-CM | POA: Diagnosis not present

## 2019-02-11 DIAGNOSIS — I129 Hypertensive chronic kidney disease with stage 1 through stage 4 chronic kidney disease, or unspecified chronic kidney disease: Secondary | ICD-10-CM | POA: Diagnosis not present

## 2019-02-11 DIAGNOSIS — N2581 Secondary hyperparathyroidism of renal origin: Secondary | ICD-10-CM | POA: Diagnosis not present

## 2019-02-11 DIAGNOSIS — E1122 Type 2 diabetes mellitus with diabetic chronic kidney disease: Secondary | ICD-10-CM | POA: Diagnosis not present

## 2019-02-11 DIAGNOSIS — N183 Chronic kidney disease, stage 3 unspecified: Secondary | ICD-10-CM | POA: Diagnosis not present

## 2019-02-15 ENCOUNTER — Ambulatory Visit: Payer: Medicare Other | Attending: Internal Medicine

## 2019-02-15 DIAGNOSIS — Z20822 Contact with and (suspected) exposure to covid-19: Secondary | ICD-10-CM

## 2019-02-16 LAB — NOVEL CORONAVIRUS, NAA: SARS-CoV-2, NAA: NOT DETECTED

## 2019-02-18 ENCOUNTER — Encounter (HOSPITAL_COMMUNITY): Payer: Medicare Other

## 2019-02-18 ENCOUNTER — Telehealth: Payer: Self-pay | Admitting: Family

## 2019-02-18 NOTE — Telephone Encounter (Signed)
Patient is calling to receive her COVID test results. Patient expressed understanding.

## 2019-02-21 ENCOUNTER — Other Ambulatory Visit: Payer: Self-pay | Admitting: Family

## 2019-02-21 DIAGNOSIS — I10 Essential (primary) hypertension: Secondary | ICD-10-CM

## 2019-02-22 ENCOUNTER — Other Ambulatory Visit: Payer: Self-pay | Admitting: Endocrinology

## 2019-03-06 ENCOUNTER — Other Ambulatory Visit: Payer: Self-pay | Admitting: Family

## 2019-03-06 ENCOUNTER — Telehealth: Payer: Self-pay

## 2019-03-06 MED ORDER — TELMISARTAN-HCTZ 80-25 MG PO TABS: 1.0000 | ORAL_TABLET | Freq: Every day | ORAL | 1 refills | Status: DC

## 2019-03-06 NOTE — Telephone Encounter (Signed)
New message     1. Which medications need to be refilled? (please list name of each medication and dose if known) telmisartan-hydrochlorothiazide (MICARDIS HCT) 80-25 MG tablet  2. Which pharmacy/location (including street and city if local pharmacy) is medication to be sent to? [facility] [address]   3. Do they need a 30 day or 90 day supply? 90 day supply

## 2019-03-20 ENCOUNTER — Ambulatory Visit: Payer: Medicare Other | Admitting: Podiatry

## 2019-03-20 ENCOUNTER — Encounter: Payer: Self-pay | Admitting: Podiatry

## 2019-03-20 ENCOUNTER — Other Ambulatory Visit: Payer: Self-pay

## 2019-03-20 VITALS — Temp 97.3°F

## 2019-03-20 DIAGNOSIS — B351 Tinea unguium: Secondary | ICD-10-CM

## 2019-03-20 DIAGNOSIS — E1151 Type 2 diabetes mellitus with diabetic peripheral angiopathy without gangrene: Secondary | ICD-10-CM | POA: Diagnosis not present

## 2019-03-20 DIAGNOSIS — Z8781 Personal history of (healed) traumatic fracture: Secondary | ICD-10-CM

## 2019-03-20 DIAGNOSIS — M79674 Pain in right toe(s): Secondary | ICD-10-CM

## 2019-03-20 DIAGNOSIS — M79675 Pain in left toe(s): Secondary | ICD-10-CM | POA: Diagnosis not present

## 2019-03-20 NOTE — Patient Instructions (Signed)
d Diabetes Mellitus and Foot Care Foot care is an important part of your health, especially when you have diabetes. Diabetes may cause you to have problems because of poor blood flow (circulation) to your feet and legs, which can cause your skin to:  Become thinner and drier.  Break more easily.  Heal more slowly.  Peel and crack. You may also have nerve damage (neuropathy) in your legs and feet, causing decreased feeling in them. This means that you may not notice minor injuries to your feet that could lead to more serious problems. Noticing and addressing any potential problems early is the best way to prevent future foot problems. How to care for your feet Foot hygiene  Wash your feet daily with warm water and mild soap. Do not use hot water. Then, pat your feet and the areas between your toes until they are completely dry. Do not soak your feet as this can dry your skin.  Trim your toenails straight across. Do not dig under them or around the cuticle. File the edges of your nails with an emery board or nail file.  Apply a moisturizing lotion or petroleum jelly to the skin on your feet and to dry, brittle toenails. Use lotion that does not contain alcohol and is unscented. Do not apply lotion between your toes. Shoes and socks  Wear clean socks or stockings every day. Make sure they are not too tight. Do not wear knee-high stockings since they may decrease blood flow to your legs.  Wear shoes that fit properly and have enough cushioning. Always look in your shoes before you put them on to be sure there are no objects inside.  To break in new shoes, wear them for just a few hours a day. This prevents injuries on your feet. Wounds, scrapes, corns, and calluses  Check your feet daily for blisters, cuts, bruises, sores, and redness. If you cannot see the bottom of your feet, use a mirror or ask someone for help.  Do not cut corns or calluses or try to remove them with medicine.  If  you find a minor scrape, cut, or break in the skin on your feet, keep it and the skin around it clean and dry. You may clean these areas with mild soap and water. Do not clean the area with peroxide, alcohol, or iodine.  If you have a wound, scrape, corn, or callus on your foot, look at it several times a day to make sure it is healing and not infected. Check for: ? Redness, swelling, or pain. ? Fluid or blood. ? Warmth. ? Pus or a bad smell. General instructions  Do not cross your legs. This may decrease blood flow to your feet.  Do not use heating pads or hot water bottles on your feet. They may burn your skin. If you have lost feeling in your feet or legs, you may not know this is happening until it is too late.  Protect your feet from hot and cold by wearing shoes, such as at the beach or on hot pavement.  Schedule a complete foot exam at least once a year (annually) or more often if you have foot problems. If you have foot problems, report any cuts, sores, or bruises to your health care provider immediately. Contact a health care provider if:  You have a medical condition that increases your risk of infection and you have any cuts, sores, or bruises on your feet.  You have an injury that is  not healing.  You have redness on your legs or feet.  You feel burning or tingling in your legs or feet.  You have pain or cramps in your legs and feet.  Your legs or feet are numb.  Your feet always feel cold.  You have pain around a toenail. Get help right away if:  You have a wound, scrape, corn, or callus on your foot and: ? You have pain, swelling, or redness that gets worse. ? You have fluid or blood coming from the wound, scrape, corn, or callus. ? Your wound, scrape, corn, or callus feels warm to the touch. ? You have pus or a bad smell coming from the wound, scrape, corn, or callus. ? You have a fever. ? You have a red line going up your leg. Summary  Check your feet every  day for cuts, sores, red spots, swelling, and blisters.  Moisturize feet and legs daily.  Wear shoes that fit properly and have enough cushioning.  If you have foot problems, report any cuts, sores, or bruises to your health care provider immediately.  Schedule a complete foot exam at least once a year (annually) or more often if you have foot problems. This information is not intended to replace advice given to you by your health care provider. Make sure you discuss any questions you have with your health care provider. Document Revised: 10/03/2018 Document Reviewed: 02/12/2016 Elsevier Patient Education  [place].

## 2019-03-21 NOTE — Progress Notes (Signed)
Subjective: [name] presents today for follow up of preventative diabetic foot care, follow up stress fracture of left foot. She was seen by Dr. Boneta Lucks and was noted to be healed. She notes no new episodes of trauma or pain. and painful mycotic nails b/l that are difficult to trim. Pain interferes with ambulation. Aggravating factors include wearing enclosed shoe gear. Pain is relieved with periodic professional debridement.   No Known Allergies   Objective: Vitals:   03/20/19 0838  Temp: (!) 97.3 F (36.3 C)    Vascular Examination:  Capillary refill time to digits immediate b/l, faintly palpable DP pulses b/l, non-palpable PT pulses b/l, pedal hair absent b/l and skin temperature gradient within normal limits b/l  Dermatological Examination: Pedal skin is thin shiny, atrophic bilaterally, no open wounds bilaterally and toenails 1-5 b/l elongated, dystrophic, thickened, crumbly with subungual debris  Musculoskeletal: Normal muscle strength 5/5 to all lower extremity muscle groups bilaterally and no pain crepitus or joint limitation noted with ROM b/l.  No pain on palpation left 5th metatarsal head.  Neurological: Protective sensation intact 5/5 intact bilaterally with 10g monofilament b/l and vibratory sensation intact b/l  Assessment: 1. Pain due to onychomycosis of toenails of both feet   2. Healed fracture of metatarsal bone   3. Type II diabetes mellitus with peripheral circulatory disorder (HCC)    Plan: -Continue diabetic foot care principles. Literature dispensed on today.  -Toenails 1-5 b/l were debrided in length and girth with sterile nail nippers and dremel without iatrogenic bleeding. -Patient to continue soft, supportive shoe gear daily. -Patient to report any pedal injuries to medical professional immediately. -Patient/POA to call should there be question/concern in the interim.  Return in about 10 weeks (around 05/29/2019) for diabetic foot care,  painful mycotic toenails.

## 2019-03-26 ENCOUNTER — Telehealth: Payer: Self-pay

## 2019-03-26 DIAGNOSIS — E2839 Other primary ovarian failure: Secondary | ICD-10-CM

## 2019-03-26 DIAGNOSIS — Z8781 Personal history of (healed) traumatic fracture: Secondary | ICD-10-CM

## 2019-03-26 NOTE — Telephone Encounter (Signed)
[name] with [facility] calling and states that the patient is needing to be scheduled for a bone density scan. States that it needs to be completed on or before 06/05/2019, 6 months after a fracture. Please advise. Order is needed to schedule.

## 2019-03-26 NOTE — Telephone Encounter (Signed)
This has to be scheduled at [facility]; I have put the order in though.

## 2019-03-29 NOTE — Telephone Encounter (Signed)
Patient is schedule for Dexa

## 2019-04-01 ENCOUNTER — Other Ambulatory Visit: Payer: Self-pay

## 2019-04-01 ENCOUNTER — Ambulatory Visit (INDEPENDENT_AMBULATORY_CARE_PROVIDER_SITE_OTHER)
Admission: RE | Admit: 2019-04-01 | Discharge: 2019-04-01 | Disposition: A | Discharge status: Home / Self Care | Payer: Medicare Other | Source: Ambulatory Visit | Attending: Family | Admitting: Family

## 2019-04-01 DIAGNOSIS — Z8781 Personal history of (healed) traumatic fracture: Secondary | ICD-10-CM

## 2019-04-01 DIAGNOSIS — E2839 Other primary ovarian failure: Secondary | ICD-10-CM | POA: Diagnosis not present

## 2019-04-11 ENCOUNTER — Ambulatory Visit: Payer: Medicare Other | Admitting: Endocrinology

## 2019-04-11 DIAGNOSIS — Z0289 Encounter for other administrative examinations: Secondary | ICD-10-CM

## 2019-04-25 ENCOUNTER — Other Ambulatory Visit: Payer: Self-pay

## 2019-04-25 ENCOUNTER — Encounter (HOSPITAL_COMMUNITY)
Admission: RE | Admit: 2019-04-25 | Discharge: 2019-04-25 | Disposition: A | Discharge status: Home / Self Care | Payer: Medicare Other | Source: Ambulatory Visit | Attending: Nephrology | Admitting: Nephrology

## 2019-04-25 VITALS — BP 153/52 | HR 55 | Temp 97.2°F | Resp 18

## 2019-04-25 DIAGNOSIS — D631 Anemia in chronic kidney disease: Secondary | ICD-10-CM | POA: Diagnosis not present

## 2019-04-25 DIAGNOSIS — N183 Chronic kidney disease, stage 3 unspecified: Secondary | ICD-10-CM | POA: Diagnosis not present

## 2019-04-25 LAB — IRON AND TIBC
Iron: 58 ug/dL (ref 28–170)
Saturation Ratios: 20 % (ref 10.4–31.8)
TIBC: 284 ug/dL (ref 250–450)
UIBC: 226 ug/dL

## 2019-04-25 LAB — FERRITIN: Ferritin: 99 ng/mL (ref 11–307)

## 2019-04-25 LAB — POCT HEMOGLOBIN-HEMACUE: Hemoglobin: 9.7 g/dL — ABNORMAL LOW (ref 12.0–15.0)

## 2019-04-25 MED ORDER — EPOETIN ALFA-EPBX 40000 UNIT/ML IJ SOLN: 40000.0000 [IU] | INTRAMUSCULAR | Status: DC

## 2019-04-25 MED ORDER — EPOETIN ALFA-EPBX 40000 UNIT/ML IJ SOLN
INTRAMUSCULAR | Status: AC
  Administered 2019-04-25: 09:00:00 40000 [IU] via SUBCUTANEOUS
  Filled 2019-04-25: qty 1

## 2019-05-05 ENCOUNTER — Other Ambulatory Visit: Payer: Self-pay | Admitting: Family

## 2019-05-20 ENCOUNTER — Other Ambulatory Visit: Payer: Self-pay | Admitting: Family

## 2019-05-20 MED ORDER — ONETOUCH VERIO VI STRP: ORAL_STRIP | 5 refills | Status: DC

## 2019-05-20 MED ORDER — BLOOD GLUCOSE MONITOR KIT: PACK | 0 refills | Status: DC

## 2019-05-21 ENCOUNTER — Other Ambulatory Visit: Payer: Self-pay | Admitting: Family

## 2019-05-21 DIAGNOSIS — I10 Essential (primary) hypertension: Secondary | ICD-10-CM

## 2019-05-21 MED ORDER — CLONIDINE HCL 0.2 MG PO TABS: 0.2000 mg | ORAL_TABLET | Freq: Three times a day (TID) | ORAL | 3 refills | Status: DC

## 2019-05-21 MED ORDER — VITAMIN B12 100 MCG PO TABS: 100.0000 ug | ORAL_TABLET | Freq: Every day | ORAL | 3 refills | Status: DC

## 2019-05-23 ENCOUNTER — Other Ambulatory Visit: Payer: Self-pay

## 2019-05-23 ENCOUNTER — Encounter (HOSPITAL_COMMUNITY)
Admission: RE | Admit: 2019-05-23 | Discharge: 2019-05-23 | Disposition: A | Discharge status: Home / Self Care | Payer: Medicare Other | Source: Ambulatory Visit | Attending: Nephrology | Admitting: Nephrology

## 2019-05-23 DIAGNOSIS — N183 Chronic kidney disease, stage 3 unspecified: Secondary | ICD-10-CM | POA: Diagnosis not present

## 2019-05-23 DIAGNOSIS — D631 Anemia in chronic kidney disease: Secondary | ICD-10-CM | POA: Diagnosis not present

## 2019-05-23 LAB — POCT HEMOGLOBIN-HEMACUE: Hemoglobin: 11.2 g/dL — ABNORMAL LOW (ref 12.0–15.0)

## 2019-05-23 MED ORDER — EPOETIN ALFA-EPBX 40000 UNIT/ML IJ SOLN
INTRAMUSCULAR | Status: AC
  Administered 2019-05-23: 40000 [IU]
  Filled 2019-05-23: qty 1

## 2019-05-23 MED ORDER — EPOETIN ALFA-EPBX 40000 UNIT/ML IJ SOLN: 40000.0000 [IU] | INTRAMUSCULAR | Status: DC

## 2019-05-24 ENCOUNTER — Other Ambulatory Visit: Payer: Self-pay | Admitting: Family

## 2019-05-29 ENCOUNTER — Encounter: Payer: Self-pay | Admitting: Podiatry

## 2019-05-29 ENCOUNTER — Ambulatory Visit: Payer: Medicare Other | Admitting: Podiatry

## 2019-05-29 ENCOUNTER — Other Ambulatory Visit: Payer: Self-pay

## 2019-05-29 VITALS — Temp 97.3°F

## 2019-05-29 DIAGNOSIS — E1151 Type 2 diabetes mellitus with diabetic peripheral angiopathy without gangrene: Secondary | ICD-10-CM | POA: Diagnosis not present

## 2019-05-29 DIAGNOSIS — M79674 Pain in right toe(s): Secondary | ICD-10-CM

## 2019-05-29 DIAGNOSIS — M79675 Pain in left toe(s): Secondary | ICD-10-CM

## 2019-05-29 DIAGNOSIS — B351 Tinea unguium: Secondary | ICD-10-CM

## 2019-05-29 NOTE — Patient Instructions (Signed)
Diabetes Mellitus and Foot Care Foot care is an important part of your health, especially when you have diabetes. Diabetes may cause you to have problems because of poor blood flow (circulation) to your feet and legs, which can cause your skin to:  Become thinner and drier.  Break more easily.  Heal more slowly.  Peel and crack. You may also have nerve damage (neuropathy) in your legs and feet, causing decreased feeling in them. This means that you may not notice minor injuries to your feet that could lead to more serious problems. Noticing and addressing any potential problems early is the best way to prevent future foot problems. How to care for your feet Foot hygiene  Wash your feet daily with warm water and mild soap. Do not use hot water. Then, pat your feet and the areas between your toes until they are completely dry. Do not soak your feet as this can dry your skin.  Trim your toenails straight across. Do not dig under them or around the cuticle. File the edges of your nails with an emery board or nail file.  Apply a moisturizing lotion or petroleum jelly to the skin on your feet and to dry, brittle toenails. Use lotion that does not contain alcohol and is unscented. Do not apply lotion between your toes. Shoes and socks  Wear clean socks or stockings every day. Make sure they are not too tight. Do not wear knee-high stockings since they may decrease blood flow to your legs.  Wear shoes that fit properly and have enough cushioning. Always look in your shoes before you put them on to be sure there are no objects inside.  To break in new shoes, wear them for just a few hours a day. This prevents injuries on your feet. Wounds, scrapes, corns, and calluses  Check your feet daily for blisters, cuts, bruises, sores, and redness. If you cannot see the bottom of your feet, use a mirror or ask someone for help.  Do not cut corns or calluses or try to remove them with medicine.  If you  find a minor scrape, cut, or break in the skin on your feet, keep it and the skin around it clean and dry. You may clean these areas with mild soap and water. Do not clean the area with peroxide, alcohol, or iodine.  If you have a wound, scrape, corn, or callus on your foot, look at it several times a day to make sure it is healing and not infected. Check for: ? Redness, swelling, or pain. ? Fluid or blood. ? Warmth. ? Pus or a bad smell. General instructions  Do not cross your legs. This may decrease blood flow to your feet.  Do not use heating pads or hot water bottles on your feet. They may burn your skin. If you have lost feeling in your feet or legs, you may not know this is happening until it is too late.  Protect your feet from hot and cold by wearing shoes, such as at the beach or on hot pavement.  Schedule a complete foot exam at least once a year (annually) or more often if you have foot problems. If you have foot problems, report any cuts, sores, or bruises to your health care provider immediately. Contact a health care provider if:  You have a medical condition that increases your risk of infection and you have any cuts, sores, or bruises on your feet.  You have an injury that is not   healing.  You have redness on your legs or feet.  You feel burning or tingling in your legs or feet.  You have pain or cramps in your legs and feet.  Your legs or feet are numb.  Your feet always feel cold.  You have pain around a toenail. Get help right away if:  You have a wound, scrape, corn, or callus on your foot and: ? You have pain, swelling, or redness that gets worse. ? You have fluid or blood coming from the wound, scrape, corn, or callus. ? Your wound, scrape, corn, or callus feels warm to the touch. ? You have pus or a bad smell coming from the wound, scrape, corn, or callus. ? You have a fever. ? You have a red line going up your leg. Summary  Check your feet every day  for cuts, sores, red spots, swelling, and blisters.  Moisturize feet and legs daily.  Wear shoes that fit properly and have enough cushioning.  If you have foot problems, report any cuts, sores, or bruises to your health care provider immediately.  Schedule a complete foot exam at least once a year (annually) or more often if you have foot problems. This information is not intended to replace advice given to you by your health care provider. Make sure you discuss any questions you have with your health care provider. Document Revised: 10/03/2018 Document Reviewed: 02/12/2016 Elsevier Patient Education  2020 [employer] Inc.  Onychomycosis/Fungal Toenails  WHAT IS IT? An infection that lies within the keratin of your nail plate that is caused by a fungus.  WHY ME? Fungal infections affect all ages, sexes, races, and creeds.  There may be many factors that predispose you to a fungal infection such as age, coexisting medical conditions such as diabetes, or an autoimmune disease; stress, medications, fatigue, genetics, etc.  Bottom line: fungus thrives in a warm, moist environment and your shoes offer such a location.  IS IT CONTAGIOUS? Theoretically, yes.  You do not want to share shoes, nail clippers or files with someone who has fungal toenails.  Walking around barefoot in the same room or sleeping in the same bed is unlikely to transfer the organism.  It is important to realize, however, that fungus can spread easily from one nail to the next on the same foot.  HOW DO WE TREAT THIS?  There are several ways to treat this condition.  Treatment may depend on many factors such as age, medications, pregnancy, liver and kidney conditions, etc.  It is best to ask your doctor which options are available to you.  5. No treatment.   Unlike many other medical concerns, you can live with this condition.  However for many people this can be a painful condition and may lead to ingrown toenails or a bacterial  infection.  It is recommended that you keep the nails cut short to help reduce the amount of fungal nail. 6. Topical treatment.  These range from herbal remedies to prescription strength nail lacquers.  About 40-50% effective, topicals require twice daily application for approximately 9 to 12 months or until an entirely new nail has grown out.  The most effective topicals are medical grade medications available through physicians offices. 7. Oral antifungal medications.  With an 80-90% cure rate, the most common oral medication requires 3 to 4 months of therapy and stays in your system for a year as the new nail grows out.  Oral antifungal medications do require blood work to make   sure it is a safe drug for you.  A liver function panel will be performed prior to starting the medication and after the first month of treatment.  It is important to have the blood work performed to avoid any harmful side effects.  In general, this medication safe but blood work is required. 8. Laser Therapy.  This treatment is performed by applying a specialized laser to the affected nail plate.  This therapy is noninvasive, fast, and non-painful.  It is not covered by insurance and is therefore, out of pocket.  The results have been very good with a 80-95% cure rate.  [facility] is the only practice in the area to offer this therapy. 9. Permanent Nail Avulsion.  Removing the entire nail so that a new nail will not grow back. 

## 2019-06-03 DIAGNOSIS — I129 Hypertensive chronic kidney disease with stage 1 through stage 4 chronic kidney disease, or unspecified chronic kidney disease: Secondary | ICD-10-CM | POA: Diagnosis not present

## 2019-06-03 DIAGNOSIS — D631 Anemia in chronic kidney disease: Secondary | ICD-10-CM | POA: Diagnosis not present

## 2019-06-03 DIAGNOSIS — N2581 Secondary hyperparathyroidism of renal origin: Secondary | ICD-10-CM | POA: Diagnosis not present

## 2019-06-03 DIAGNOSIS — N183 Chronic kidney disease, stage 3 unspecified: Secondary | ICD-10-CM | POA: Diagnosis not present

## 2019-06-03 DIAGNOSIS — E1122 Type 2 diabetes mellitus with diabetic chronic kidney disease: Secondary | ICD-10-CM | POA: Diagnosis not present

## 2019-06-03 NOTE — Progress Notes (Signed)
Subjective: [name] is a 70 y.o. female patient seen today at [facility]. Pt has h/o NIDDM with PAD and painful mycotic nails b/l that are difficult to trim. Pain interferes with ambulation. Aggravating factors include wearing enclosed shoe gear. Pain is relieved with periodic professional debridement.   Patient Active Problem List   Diagnosis Date Noted  . Vitamin D toxicity, accidental or unintentional, initial encounter 09/09/2017  . Ataxia 09/09/2017  . Type 2 diabetes mellitus with hyperglycemia ([place]) 09/09/2017  . Chronic kidney disease (CKD)   . Olecranon bursitis of right elbow 03/09/2017  . Thyroid nodule 06/29/2016  . Type 2 diabetes mellitus ([place]) 06/13/2016  . Vitamin D deficiency 01/12/2016  . Medicare annual wellness visit, initial 01/11/2016  . Lacunar infarct, acute ([place]) 03/23/2015  . Non compliance w medication regimen 01/16/2015  . Anemia 01/08/2015  . Stroke ([place]) 01/08/2015  . Hyperlipemia   . Carotid bruit present 12/13/2012  . Routine health maintenance 10/24/2010  . PERIPHERAL EDEMA 09/10/2009  . ELECTROCARDIOGRAM, ABNORMAL 04/11/2008  . HEART MURMUR, SYSTOLIC 91/63/8466  . SEBACEOUS CYST, NECK 12/13/2006  . Hyperthyroidism 09/27/2006  . Secondary diabetes with peripheral vascular disease ([place]) 09/27/2006  . Hyperlipidemia LDL goal <70 09/27/2006  . TOBACCO ABUSE 09/27/2006  . Essential hypertension 09/27/2006    Current Outpatient Medications on File Prior to Visit  Medication Sig Dispense Refill  . amLODipine (NORVASC) 10 MG tablet Take 1 tablet (10 mg total) by mouth daily. 90 tablet 3  . aspirin 325 MG tablet Take 325 mg by mouth daily.    [name] atorvastatin (LIPITOR) 80 MG tablet TAKE 1 TABLET BY MOUTH EVERY DAY 90 tablet 2  . blood glucose meter kit and supplies KIT Please dispense One Touch Verio Flex Kit   Use up to four times daily as directed. (FOR ICD-9 250.00, 250.01). 1 each 0  . bromocriptine (PARLODEL) 2.5 MG tablet Take 0.5  tablets (1.25 mg total) by mouth daily. 45 tablet 2  . calcitRIOL (ROCALTROL) 0.25 MCG capsule 1 (ONE) CAPSULE THREE TIMES A WEEK    . cloNIDine (CATAPRES) 0.2 MG tablet Take 1 tablet (0.2 mg total) by mouth 3 (three) times daily. 270 tablet 3  . furosemide (LASIX) 40 MG tablet Take 1 tablet (40 mg total) by mouth See admin instructions. Take 1 tablet daily, can take another pill if ankles are swelling -Hold for the next 3 days and resume on 09/13/2017 (Patient not taking: Reported on 03/20/2019) 30 tablet   . glucose blood (ONETOUCH VERIO) test strip USE AS INSTRUCTED (TESTS 2 TIMES A DAY) 100 each 5  . metoprolol succinate (TOPROL-XL) 100 MG 24 hr tablet TAKE 1 TABLET BY MOUTH EVERY DAY FOR HIGH BLOOD PRESSURE 90 tablet 1  . minoxidil (LONITEN) 2.5 MG tablet Take 2.5 mg by mouth 2 (two) times daily.    . pantoprazole (PROTONIX) 40 MG tablet Take 1 tablet (40 mg total) by mouth daily. 90 tablet 2  . repaglinide (PRANDIN) 2 MG tablet TAKE 1 TABLET (2 MG TOTAL) BY MOUTH 3 (THREE) TIMES DAILY BEFORE MEALS. 270 tablet 3  . telmisartan-hydrochlorothiazide (MICARDIS HCT) 80-25 MG tablet Take 1 tablet by mouth daily. 90 tablet 1  . vitamin B-12 (CYANOCOBALAMIN) 100 MCG tablet Take 1 tablet (100 mcg total) by mouth at bedtime. 90 tablet 3  . Vitamin D, Ergocalciferol, (DRISDOL) 1.25 MG (50000 UNIT) CAPS capsule Take 50,000 Units by mouth once a week.     No current facility-administered medications on file prior  to visit.    No Known Allergies  Objective: Physical Exam  General: Well developed, nourished, no acute distress, awake, alert and oriented x 3  Vascular:  Neurovascular status unchanged b/l. Capillary refill time to digits immediate b/l. Faintly palpable DP pulses b/l. Nonpalpable PT pulses b/l. Pedal hair absent b/l Skin temperature gradient within normal limits b/l. No edema noted b/l.  Dermatological:  Pedal skin is thin shiny, atrophic bilaterally. No open wounds bilaterally. No  interdigital macerations bilaterally. Toenails 1-5 b/l elongated, dystrophic, thickened, crumbly with subungual debris and tenderness to dorsal palpation.  Musculoskeletal:  Normal muscle strength 5/5 to all lower extremity muscle groups bilaterally. No gross bony deformities bilaterally. No pain crepitus or joint limitation noted with ROM b/l.  Neurological:  Protective sensation intact 5/5 intact bilaterally with 10g monofilament b/l. Proprioception intact bilaterally.  Assessment and Plan:  1. Pain due to onychomycosis of toenails of both feet   2. Type II diabetes mellitus with peripheral circulatory disorder (HCC)    -Examined patient. -No new findings. No new orders. -Continue diabetic foot care principles. Literature dispensed on today.  -Toenails 1-5 b/l were debrided in length and girth with sterile nail nippers and dremel without iatrogenic bleeding.  -Patient to continue soft, supportive shoe gear daily. -Patient to report any pedal injuries to medical professional immediately. -Patient/POA to call should there be question/concern in the interim.  Return in about 10 weeks (around 08/07/2019) for diabetic nail trim.  Marzetta Board, DPM

## 2019-06-07 DIAGNOSIS — E119 Type 2 diabetes mellitus without complications: Secondary | ICD-10-CM | POA: Diagnosis not present

## 2019-06-12 ENCOUNTER — Other Ambulatory Visit: Payer: Self-pay | Admitting: Family

## 2019-06-12 ENCOUNTER — Telehealth: Payer: Self-pay

## 2019-06-12 DIAGNOSIS — I10 Essential (primary) hypertension: Secondary | ICD-10-CM

## 2019-06-12 MED ORDER — PANTOPRAZOLE SODIUM 40 MG PO TBEC: 40.0000 mg | DELAYED_RELEASE_TABLET | Freq: Every day | ORAL | 3 refills | Status: AC

## 2019-06-12 MED ORDER — METOPROLOL SUCCINATE ER 100 MG PO TB24: 100.0000 mg | ORAL_TABLET | Freq: Every day | ORAL | 3 refills | Status: DC

## 2019-06-12 MED ORDER — TELMISARTAN-HCTZ 80-25 MG PO TABS: 1.0000 | ORAL_TABLET | Freq: Every day | ORAL | 3 refills | Status: DC

## 2019-06-12 MED ORDER — ATORVASTATIN CALCIUM 80 MG PO TABS: ORAL_TABLET | ORAL | 3 refills | Status: DC

## 2019-06-12 NOTE — Telephone Encounter (Signed)
New message    The patient is asking for all medication be sent to [place]  Order # [id number] - patient does not know the name of the prescription.

## 2019-06-14 ENCOUNTER — Other Ambulatory Visit: Payer: Self-pay | Admitting: Obstetrics and Gynecology

## 2019-06-14 ENCOUNTER — Other Ambulatory Visit: Payer: Self-pay | Admitting: Nurse Practitioner

## 2019-06-14 DIAGNOSIS — Z1231 Encounter for screening mammogram for malignant neoplasm of breast: Secondary | ICD-10-CM

## 2019-06-14 DIAGNOSIS — R809 Proteinuria, unspecified: Secondary | ICD-10-CM | POA: Diagnosis not present

## 2019-06-20 ENCOUNTER — Other Ambulatory Visit: Payer: Self-pay

## 2019-06-20 ENCOUNTER — Ambulatory Visit (HOSPITAL_COMMUNITY)
Admission: RE | Admit: 2019-06-20 | Discharge: 2019-06-20 | Disposition: A | Discharge status: Home / Self Care | Payer: Medicare Other | Source: Ambulatory Visit | Attending: Nephrology | Admitting: Nephrology

## 2019-06-20 DIAGNOSIS — N183 Chronic kidney disease, stage 3 unspecified: Secondary | ICD-10-CM | POA: Diagnosis not present

## 2019-06-20 DIAGNOSIS — D631 Anemia in chronic kidney disease: Secondary | ICD-10-CM | POA: Diagnosis not present

## 2019-06-20 LAB — IRON AND TIBC
Iron: 91 ug/dL (ref 28–170)
Saturation Ratios: 30 % (ref 10.4–31.8)
TIBC: 300 ug/dL (ref 250–450)
UIBC: 209 ug/dL

## 2019-06-20 LAB — POCT HEMOGLOBIN-HEMACUE: Hemoglobin: 11.6 g/dL — ABNORMAL LOW (ref 12.0–15.0)

## 2019-06-20 LAB — FERRITIN: Ferritin: 100 ng/mL (ref 11–307)

## 2019-06-20 MED ORDER — EPOETIN ALFA-EPBX 40000 UNIT/ML IJ SOLN
INTRAMUSCULAR | Status: AC
  Administered 2019-06-20: 40000 [IU] via SUBCUTANEOUS
  Filled 2019-06-20: qty 1

## 2019-06-20 MED ORDER — EPOETIN ALFA-EPBX 40000 UNIT/ML IJ SOLN: 40000.0000 [IU] | INTRAMUSCULAR | Status: DC

## 2019-07-18 ENCOUNTER — Other Ambulatory Visit: Payer: Self-pay

## 2019-07-18 ENCOUNTER — Ambulatory Visit (HOSPITAL_COMMUNITY)
Admission: RE | Admit: 2019-07-18 | Discharge: 2019-07-18 | Disposition: A | Discharge status: Home / Self Care | Payer: Medicare Other | Source: Ambulatory Visit | Attending: Nephrology | Admitting: Nephrology

## 2019-07-18 DIAGNOSIS — N183 Chronic kidney disease, stage 3 unspecified: Secondary | ICD-10-CM | POA: Insufficient documentation

## 2019-07-18 DIAGNOSIS — D631 Anemia in chronic kidney disease: Secondary | ICD-10-CM | POA: Diagnosis not present

## 2019-07-18 LAB — FERRITIN: Ferritin: 68 ng/mL (ref 11–307)

## 2019-07-18 LAB — IRON AND TIBC
Iron: 50 ug/dL (ref 28–170)
Saturation Ratios: 19 % (ref 10.4–31.8)
TIBC: 258 ug/dL (ref 250–450)
UIBC: 208 ug/dL

## 2019-07-18 LAB — POCT HEMOGLOBIN-HEMACUE: Hemoglobin: 10.2 g/dL — ABNORMAL LOW (ref 12.0–15.0)

## 2019-07-18 MED ORDER — EPOETIN ALFA-EPBX 40000 UNIT/ML IJ SOLN
INTRAMUSCULAR | Status: AC
  Filled 2019-07-18: qty 1

## 2019-07-18 MED ORDER — EPOETIN ALFA-EPBX 40000 UNIT/ML IJ SOLN
40000.0000 [IU] | INTRAMUSCULAR | Status: DC
  Administered 2019-07-18: 40000 [IU] via SUBCUTANEOUS

## 2019-07-19 ENCOUNTER — Ambulatory Visit: Payer: Medicare Other

## 2019-08-01 ENCOUNTER — Other Ambulatory Visit: Payer: Self-pay | Admitting: Family

## 2019-08-03 ENCOUNTER — Other Ambulatory Visit: Payer: Self-pay | Admitting: Endocrinology

## 2019-08-03 DIAGNOSIS — N183 Chronic kidney disease, stage 3 unspecified: Secondary | ICD-10-CM

## 2019-08-03 NOTE — Telephone Encounter (Signed)
1.  Please schedule f/u appt 2.  Then please refill x 2 mos, pending that appt.  

## 2019-08-07 ENCOUNTER — Ambulatory Visit: Payer: Medicare Other | Admitting: Podiatry

## 2019-08-15 ENCOUNTER — Other Ambulatory Visit: Payer: Self-pay

## 2019-08-15 ENCOUNTER — Encounter (HOSPITAL_COMMUNITY)
Admission: RE | Admit: 2019-08-15 | Discharge: 2019-08-15 | Disposition: A | Discharge status: Home / Self Care | Payer: Medicare Other | Source: Ambulatory Visit | Attending: Nephrology | Admitting: Nephrology

## 2019-08-15 VITALS — BP 176/66 | HR 58 | Temp 97.2°F | Resp 18

## 2019-08-15 DIAGNOSIS — N189 Chronic kidney disease, unspecified: Secondary | ICD-10-CM | POA: Diagnosis not present

## 2019-08-15 DIAGNOSIS — N183 Chronic kidney disease, stage 3 unspecified: Secondary | ICD-10-CM

## 2019-08-15 DIAGNOSIS — D631 Anemia in chronic kidney disease: Secondary | ICD-10-CM | POA: Diagnosis not present

## 2019-08-15 LAB — IRON AND TIBC
Iron: 84 ug/dL (ref 28–170)
Saturation Ratios: 27 % (ref 10.4–31.8)
TIBC: 311 ug/dL (ref 250–450)
UIBC: 227 ug/dL

## 2019-08-15 LAB — POCT HEMOGLOBIN-HEMACUE: Hemoglobin: 10.8 g/dL — ABNORMAL LOW (ref 12.0–15.0)

## 2019-08-15 LAB — FERRITIN: Ferritin: 95 ng/mL (ref 11–307)

## 2019-08-15 MED ORDER — EPOETIN ALFA-EPBX 40000 UNIT/ML IJ SOLN
INTRAMUSCULAR | Status: AC
  Administered 2019-08-15: 40000 [IU] via SUBCUTANEOUS
  Filled 2019-08-15: qty 1

## 2019-08-15 MED ORDER — EPOETIN ALFA-EPBX 40000 UNIT/ML IJ SOLN: 40000.0000 [IU] | INTRAMUSCULAR | Status: DC

## 2019-08-20 ENCOUNTER — Other Ambulatory Visit: Payer: Self-pay | Admitting: Family

## 2019-08-20 DIAGNOSIS — I10 Essential (primary) hypertension: Secondary | ICD-10-CM

## 2019-08-20 MED ORDER — VITAMIN B12 100 MCG PO TABS: 100.0000 ug | ORAL_TABLET | Freq: Every day | ORAL | 3 refills | Status: DC

## 2019-08-20 MED ORDER — METOPROLOL SUCCINATE ER 100 MG PO TB24: 100.0000 mg | ORAL_TABLET | Freq: Every day | ORAL | 3 refills | Status: DC

## 2019-08-23 ENCOUNTER — Ambulatory Visit (INDEPENDENT_AMBULATORY_CARE_PROVIDER_SITE_OTHER): Payer: Medicare Other

## 2019-08-23 ENCOUNTER — Telehealth: Payer: Self-pay | Admitting: Family

## 2019-08-23 DIAGNOSIS — Z0000 Encounter for general adult medical examination without abnormal findings: Secondary | ICD-10-CM

## 2019-08-23 NOTE — Progress Notes (Addendum)
I connected with [name] today by telephone and verified that I am speaking with the correct person using two identifiers. Location patient: home Location provider: work Persons participating in the virtual visit: [name] and Lisette Abu, LPN   I discussed the limitations, risks, security and privacy concerns of performing an evaluation and management service by telephone and the availability of in person appointments. I also discussed with the patient that there may be a patient responsible charge related to this service. The patient expressed understanding and verbally consented to this telephonic visit.    Interactive audio and video telecommunications were attempted between this provider and patient, however failed, due to patient having technical difficulties OR patient did not have access to video capability.  We continued and completed visit with audio only.  Some vital signs may be absent or patient reported.   Time Spent with patient on telephone encounter: 20 minutes  Subjective:   [name] is a 69 y.o. female who presents for Medicare Annual (Subsequent) preventive examination.  Review of Systems    No ROS. Medicare Wellness Virtual Visit. Additional risk factors are reflected in social history. Cardiac Risk Factors include: advanced age (>28mn, >>57women);diabetes mellitus;dyslipidemia;family history of premature cardiovascular disease;hypertension Sleep Patterns: No sleep issues, feels rested on waking and sleeps 8 hours nightly. Home Safety/Smoke Alarms: Feels safe in home; uses home alarm. Smoke alarms in place. Living environment: 1-story home; Lives alone;  no needs for DME; good support system. Seat Belt Safety/Bike Helmet: Wears seat belt.    Objective:    There were no vitals filed for this visit. There is no height or weight on file to calculate BMI.  Advanced Directives 08/23/2019 11/30/2018 08/17/2018 09/09/2017 09/08/2017 07/07/2017  02/24/2016  Does Patient Have a Medical Advance Directive? No No No - No No No  Does patient want to make changes to medical advance directive? - - Yes (ED - Information included in AVS) - - - -  Would patient like information on creating a medical advance directive? Yes (MAU/Ambulatory/Procedural Areas - Information given) No - Patient declined - Yes (Inpatient - patient requests chaplain consult to create a medical advance directive) - Yes (ED - Information included in AVS) No - Patient declined    Current Medications (verified) Outpatient Encounter Medications as of 08/23/2019  Medication Sig  . amLODipine (NORVASC) 10 MG tablet Take 1 tablet (10 mg total) by mouth daily.  .Marland Kitchenaspirin 325 MG tablet Take 325 mg by mouth daily.  .Marland Kitchenatorvastatin (LIPITOR) 80 MG tablet TAKE 1 TABLET BY MOUTH EVERY DAY  . blood glucose meter kit and supplies KIT Please dispense One Touch Verio Flex Kit   Use up to four times daily as directed. (FOR ICD-9 250.00, 250.01).  . bromocriptine (PARLODEL) 2.5 MG tablet TAKE 1/2 TABLET BY MOUTH EVERY DAY  . calcitRIOL (ROCALTROL) 0.25 MCG capsule 1 (ONE) CAPSULE THREE TIMES A WEEK  . cloNIDine (CATAPRES) 0.2 MG tablet Take 1 tablet (0.2 mg total) by mouth 3 (three) times daily.  . furosemide (LASIX) 40 MG tablet Take 1 tablet (40 mg total) by mouth See admin instructions. Take 1 tablet daily, can take another pill if ankles are swelling -Hold for the next 3 days and resume on 09/13/2017 (Patient not taking: Reported on 03/20/2019)  . glucose blood (ONETOUCH VERIO) test strip USE AS INSTRUCTED (TESTS 2 TIMES A DAY)  . metoprolol succinate (TOPROL-XL) 100 MG 24 hr tablet Take 1 tablet (100 mg total) by mouth  daily. for high blood pressure  . minoxidil (LONITEN) 2.5 MG tablet Take 2.5 mg by mouth 2 (two) times daily.  . pantoprazole (PROTONIX) 40 MG tablet Take 1 tablet (40 mg total) by mouth daily.  . repaglinide (PRANDIN) 2 MG tablet TAKE 1 TABLET (2 MG TOTAL) BY MOUTH 3  (THREE) TIMES DAILY BEFORE MEALS.  [name] telmisartan-hydrochlorothiazide (MICARDIS HCT) 80-25 MG tablet Take 1 tablet by mouth daily.  . vitamin B-12 (CYANOCOBALAMIN) 100 MCG tablet Take 1 tablet (100 mcg total) by mouth at bedtime.  . Vitamin D, Ergocalciferol, (DRISDOL) 1.25 MG (50000 UNIT) CAPS capsule Take 50,000 Units by mouth once a week.   No facility-administered encounter medications on file as of 08/23/2019.    Allergies (verified) Patient has no known allergies.   History: Past Medical History:  Diagnosis Date  . Acute kidney injury (HCC) 01/08/2015  . Arthritis   . Chronic back pain   . Constipation   . Cough   . Diabetes mellitus, type 2 (HCC)   . Diverticulosis   . Fibroid    patient thinks this was the reason for her hysterectomy  . GERD (gastroesophageal reflux disease)   . Heart murmur   . History of sebaceous cyst   . Hyperlipemia   . Hyperplastic colon polyp   . Hypertension   . Hyperthyroidism   . Lumbar radiculopathy   . Shortness of breath 09/13/2013  . Stroke (HCC) 2015?   x4   . Tobacco abuse   . 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 ABDOMINAL HYSTERECTOMY     --?ovaries remain  . COLONOSCOPY W/ BIOPSIES  09/11/2008  . RETINOPATHY SURGERY Bilateral 2013   Dr. Ashley Royalty   Family History  Problem Relation Age of Onset  . Hypertension Mother   . Sleep apnea Mother   . Diabetes Mother   . Hyperlipidemia Mother   . Lung cancer Father        smoker  . Hypertension Sister   . Diabetes Sister   . Hypertension Brother   . Hypertension Sister   . Diabetes Brother   . Hypertension Brother   . Breast cancer Neg Hx   . Colon cancer Neg Hx   . Esophageal cancer Neg Hx   . Pancreatic cancer Neg Hx   . Liver disease Neg Hx   . Colon polyps Neg Hx    Social History   Socioeconomic History  . Marital status: Divorced    Spouse name: Not on file  . Number of  children: 1  . Years of education: 50  . Highest education level: Not on file  Occupational History  . Occupation: Engineer, drilling: [employer]    Comment: Retired  Tobacco Use  . Smoking status: Former Smoker    Packs/day: 0.50    Years: 35.00    Pack years: 17.50    Types: Cigarettes    Quit date: 07/07/2014    Years since quitting: 5.1  . Smokeless tobacco: Never Used  Vaping Use  . Vaping Use: Never used  Substance and Sexual Activity  . Alcohol use: No  . Drug use: No  . Sexual activity: Never    Partners: Male    Birth control/protection: Surgical    Comment: TAH--?ovaries remain  Other Topics Concern  . Not on file  Social History Narrative   Divorced 1 son    Retired Administrator, sports  for [employer]   Former smoker no alcohol caffeine or drug use report denies abuse and feels safe at home.   Social Determinants of Health   Financial Resource Strain: Medium Risk  . Difficulty of Paying Living Expenses: Somewhat hard  Food Insecurity: Food Insecurity Present  . Worried About Charity fundraiser in the Last Year: Often true  . Ran Out of Food in the Last Year: Sometimes true  Transportation Needs: No Transportation Needs  . Lack of Transportation (Medical): No  . Lack of Transportation (Non-Medical): No  Physical Activity: Sufficiently Active  . Days of Exercise per Week: 5 days  . Minutes of Exercise per Session: 30 min  Stress: No Stress Concern Present  . Feeling of Stress : Not at all  Social Connections:   . Frequency of Communication with Friends and Family:   . Frequency of Social Gatherings with Friends and Family:   . Attends Religious Services:   . Active Member of Clubs or Organizations:   . Attends Archivist Meetings:   [name] Marital Status:     Tobacco Counseling Counseling given: Not Answered   Clinical Intake:  Pre-visit preparation completed: Yes  Pain : No/denies pain     Nutritional Risks:  None Diabetes: Yes CBG done?: No Did pt. bring in CBG monitor from home?: No  How often do you need to have someone help you when you read instructions, pamphlets, or other written materials from your doctor or pharmacy?: 1 - Never What is the last grade level you completed in school?: 11th grade  Diabetic? yes  Interpreter Needed?: No  Information entered by :: Terrence Pizana N. Amun Stemm, LPN   Activities of Daily Living In your present state of health, do you have any difficulty performing the following activities: 08/23/2019  Hearing? N  Vision? N  Difficulty concentrating or making decisions? N  Walking or climbing stairs? N  Dressing or bathing? N  Doing errands, shopping? N  Preparing Food and eating ? N  Using the Toilet? N  In the past six months, have you accidently leaked urine? N  Do you have problems with loss of bowel control? N  Managing your Medications? N  Managing your Finances? N  Housekeeping or managing your Housekeeping? N  Some recent data might be hidden    Patient Care Team: Marrian Salvage, FNP as PCP - General (Internal Medicine) Hayden Pedro, MD (Ophthalmology)  Indicate any recent Medical Services you may have received from other than [facility] providers in the past year (date may be approximate).     Assessment:   This is a routine wellness examination for [place].  Hearing/Vision screen No exam data present  Dietary issues and exercise activities discussed: Current Exercise Habits: Home exercise routine, Type of exercise: walking, Time (Minutes): 30, Frequency (Times/Week): 5, Weekly Exercise (Minutes/Week): 150, Intensity: Moderate, Exercise limited by: None identified  Goals    . Patient Stated     I want to exercise and eat healthy for my diabetes and high blood pressure. Continue good recipes at home.      Depression Screen PHQ 2/9 Scores 08/23/2019 08/17/2018 07/07/2017 02/24/2017 01/11/2016  PHQ - 2 Score 0 0 0 0 0    Fall  Risk Fall Risk  08/23/2019 08/17/2018 07/07/2017 02/24/2017 01/11/2016  Falls in the past year? 0 0 No No No  Number falls in past yr: 0 0 - - -  Injury with Fall? 0 - - - -  Risk for fall due to : No Fall Risks - - - -  Follow up Falls evaluation completed - - - -    Any stairs in or around the home? No  If so, are there any without handrails? No  Home free of loose throw rugs in walkways, pet beds, electrical cords, etc? Yes  Adequate lighting in your home to reduce risk of falls? Yes   ASSISTIVE DEVICES UTILIZED TO PREVENT FALLS:  Life alert? No  Use of a cane, walker or w/c? No  Grab bars in the bathroom? Yes  Shower chair or bench in shower? No  Elevated toilet seat or a handicapped toilet? No   TIMED UP AND GO:  Was the test performed? No .  Length of time to ambulate 10 feet: 0 sec.   Gait steady and fast without use of assistive device  Cognitive Function: Not indicated.        Immunizations Immunization History  Administered Date(s) Administered  . Influenza Whole 10/25/2011  . Influenza, High Dose Seasonal PF 01/11/2016, 12/05/2016, 12/15/2017  . Influenza,inj,Quad PF,6+ Mos 12/13/2012, 10/07/2014  . PFIZER SARS-COV-2 Vaccination 03/21/2019, 04/18/2019  . Pneumococcal Conjugate-13 08/28/2015  . Pneumococcal Polysaccharide-23 12/13/2011, 07/07/2017  . Td 09/09/2008  . Zoster 12/13/2012    TDAP status: Due, Education has been provided regarding the importance of this vaccine. Advised may receive this vaccine at local pharmacy or Health Dept. Aware to provide a copy of the vaccination record if obtained from local pharmacy or Health Dept. Verbalized acceptance and understanding. Flu Vaccine status: Up to date Pneumococcal vaccine status: Up to date Covid-19 vaccine status: Completed vaccines  Qualifies for Shingles Vaccine? Yes   Zostavax completed Yes   Shingrix Completed?: No.    Education has been provided regarding the importance of this vaccine. Patient  has been advised to call insurance company to determine out of pocket expense if they have not yet received this vaccine. Advised may also receive vaccine at local pharmacy or Health Dept. Verbalized acceptance and understanding.  Screening Tests Health Maintenance  Topic Date Due  . TETANUS/TDAP  09/10/2018  . OPHTHALMOLOGY EXAM  02/02/2019  . FOOT EXAM  03/08/2019  . HEMOGLOBIN A1C  07/09/2019  . INFLUENZA VACCINE  08/25/2019  . MAMMOGRAM  07/15/2020  . COLONOSCOPY  12/18/2028  . DEXA SCAN  Completed  . COVID-19 Vaccine  Completed  . Hepatitis C Screening  Completed  . PNA vac Low Risk Adult  Completed    Health Maintenance  Health Maintenance Due  Topic Date Due  . TETANUS/TDAP  09/10/2018  . OPHTHALMOLOGY EXAM  02/02/2019  . FOOT EXAM  03/08/2019  . HEMOGLOBIN A1C  07/09/2019    Colorectal cancer screening: Completed 12/19/2018. Repeat every 10 years Mammogram status: Completed 07/16/2018. Repeat every year Bone Density status: Completed 04/01/2019. Results reflect: Bone density results: NORMAL. Repeat every 2-3 years.  Lung Cancer Screening: (Low Dose CT Chest recommended if Age 39-80 years, 30 pack-year currently smoking OR have quit w/in 15years.) does not qualify.   Lung Cancer Screening Referral: no  Additional Screening:  Hepatitis C Screening: does qualify; Completed yes  Vision Screening: Recommended annual ophthalmology exams for early detection of glaucoma and other disorders of the eye. Is the patient up to date with their annual eye exam?  Yes  Who is the provider or what is the name of the office in which the patient attends annual eye exams? [facility] If pt is not established with a provider,  would they like to be referred to a provider to establish care? No .   Dental Screening: Recommended annual dental exams for proper oral hygiene  Community Resource Referral / Chronic Care Management: CRR required this visit?  Yes   CCM required this visit?   Yes      Plan:     I have personally reviewed and noted the following in the patient's chart:   . Medical and social history . Use of alcohol, tobacco or illicit drugs  . Current medications and supplements . Functional ability and status . Nutritional status . Physical activity . Advanced directives . List of other physicians . Hospitalizations, surgeries, and ER visits in previous 12 months . Vitals . Screenings to include cognitive, depression, and falls . Referrals and appointments  In addition, I have reviewed and discussed with patient certain preventive protocols, quality metrics, and best practice recommendations. A written personalized care plan for preventive services as well as general preventive health recommendations were provided to patient.     Sheral Flow, LPN   08/01/6436   Nurse Notes:  [id number] referrals placed for: food insecurity, assistance with SNAP or Food Stamps and financial resource strain. Patient is cogitatively intact. There were no vitals filed for this visit. There is no height or weight on file to calculate BMI. Patient stated that she has no issues with gait or balance; does not use any assistive devices.  Medical screening examination/treatment/procedure(s) were performed by non-physician practitioner and as supervising provider I was immediately available for consultation/collaboration.  I agree with above. Marrian Salvage, FNP

## 2019-08-23 NOTE — Progress Notes (Signed)
  Chronic Care Management   Note  08/23/2019 Name: [name] MRN: [id number] DOB: 03/02/1950  [name] is a 69 y.o. year old female who is a primary care patient of Marrian Salvage, [place]. I reached out to Elberta Fortis by phone today in response to a referral sent by [name]'s PCP, Marrian Salvage, FNP.   [name] was given information about Chronic Care Management services today including:  1. CCM service includes personalized support from designated clinical staff supervised by her physician, including individualized plan of care and coordination with other care providers 2. 24/7 contact phone numbers for assistance for urgent and routine care needs. 3. Service will only be billed when office clinical staff spend 20 minutes or more in a month to coordinate care. 4. Only one practitioner may furnish and bill the service in a calendar month. 5. The patient may stop CCM services at any time (effective at the end of the month) by phone call to the office staff.   Patient agreed to services and verbal consent obtained.   Follow up plan:   [name] Upstream Scheduler

## 2019-08-23 NOTE — Patient Instructions (Addendum)
[name] , Thank you for taking time to come for your Medicare Wellness Visit. I appreciate your ongoing commitment to your health goals. Please review the following plan we discussed and let me know if I can assist you in the future.   Screening recommendations/referrals: Colonoscopy: 12/19/2018; due every 10 years Mammogram: 07/16/2018; due every year Bone Density: 04/01/2019; due every 2-3 years Recommended yearly ophthalmology/optometry visit for glaucoma screening and checkup Recommended yearly dental visit for hygiene and checkup  Vaccinations: Influenza vaccine: 10/02/2018; due every year Pneumococcal vaccine: completed Tdap vaccine: 09/09/2008; overdue (due every 10 years) Shingles vaccine: never done; please speak with primary care or check with local pharmacy (info provided in packet)   Covid-19: completed  Advanced directives: Advance directive discussed with you today. I have provided a copy for you to complete at home and have notarized. Once this is complete please bring a copy in to our office so we can scan it into your chart.  Conditions/risks identified: Yes; Please continue to do your personal lifestyle choices by: daily care of teeth and gums, regular physical activity (goal should be 5 days a week for 30 minutes), eat a healthy diet, avoid tobacco and drug use, limiting any alcohol intake, taking a low-dose aspirin (if not allergic or have been advised by your provider otherwise) and taking vitamins and minerals as recommended by your provider. Continue doing brain stimulating activities (puzzles, reading, adult coloring books, staying active) to keep memory sharp. Continue to eat heart healthy diet (full of fruits, vegetables, whole grains, lean protein, water--limit salt, fat, and sugar intake) and increase physical activity as tolerated.  Next appointment: Please schedule your next Medicare Wellness Visit with your Nurse Health Advisor in 1 year.   Preventive Care 3  Years and Older, Female Preventive care refers to lifestyle choices and visits with your health care provider that can promote health and wellness. What does preventive care include?  A yearly physical exam. This is also called an annual well check.  Dental exams once or twice a year.  Routine eye exams. Ask your health care provider how often you should have your eyes checked.  Personal lifestyle choices, including:  Daily care of your teeth and gums.  Regular physical activity.  Eating a healthy diet.  Avoiding tobacco and drug use.  Limiting alcohol use.  Practicing safe sex.  Taking low-dose aspirin every day.  Taking vitamin and mineral supplements as recommended by your health care provider. What happens during an annual well check? The services and screenings done by your health care provider during your annual well check will depend on your age, overall health, lifestyle risk factors, and family history of disease. Counseling  Your health care provider may ask you questions about your:  Alcohol use.  Tobacco use.  Drug use.  Emotional well-being.  Home and relationship well-being.  Sexual activity.  Eating habits.  History of falls.  Memory and ability to understand (cognition).  Work and work Statistician.  Reproductive health. Screening  You may have the following tests or measurements:  Height, weight, and BMI.  Blood pressure.  Lipid and cholesterol levels. These may be checked every 5 years, or more frequently if you are over 25 years old.  Skin check.  Lung cancer screening. You may have this screening every year starting at age 45 if you have a 30-pack-year history of smoking and currently smoke or have quit within the past 15 years.  Fecal occult blood test (FOBT) of the stool.  You may have this test every year starting at age 82.  Flexible sigmoidoscopy or colonoscopy. You may have a sigmoidoscopy every 5 years or a colonoscopy every  10 years starting at age 21.  Hepatitis C blood test.  Hepatitis B blood test.  Sexually transmitted disease (STD) testing.  Diabetes screening. This is done by checking your blood sugar (glucose) after you have not eaten for a while (fasting). You may have this done every 1-3 years.  Bone density scan. This is done to screen for osteoporosis. You may have this done starting at age 52.  Mammogram. This may be done every 1-2 years. Talk to your health care provider about how often you should have regular mammograms. Talk with your health care provider about your test results, treatment options, and if necessary, the need for more tests. Vaccines  Your health care provider may recommend certain vaccines, such as:  Influenza vaccine. This is recommended every year.  Tetanus, diphtheria, and acellular pertussis (Tdap, Td) vaccine. You may need a Td booster every 10 years.  Zoster vaccine. You may need this after age 66.  Pneumococcal 13-valent conjugate (PCV13) vaccine. One dose is recommended after age 19.  Pneumococcal polysaccharide (PPSV23) vaccine. One dose is recommended after age 20. Talk to your health care provider about which screenings and vaccines you need and how often you need them. This information is not intended to replace advice given to you by your health care provider. Make sure you discuss any questions you have with your health care provider. Document Released: 02/06/2015 Document Revised: 09/30/2015 Document Reviewed: 11/11/2014 Elsevier Interactive Patient Education  2017 [employer] Prevention in the Home Falls can cause injuries. They can happen to people of all ages. There are many things you can do to make your home safe and to help prevent falls. What can I do on the outside of my home?  Regularly fix the edges of walkways and driveways and fix any cracks.  Remove anything that might make you trip as you walk through a door, such as a raised step  or threshold.  Trim any bushes or trees on the path to your home.  Use bright outdoor lighting.  Clear any walking paths of anything that might make someone trip, such as rocks or tools.  Regularly check to see if handrails are loose or broken. Make sure that both sides of any steps have handrails.  Any raised decks and porches should have guardrails on the edges.  Have any leaves, snow, or ice cleared regularly.  Use sand or salt on walking paths during winter.  Clean up any spills in your garage right away. This includes oil or grease spills. What can I do in the bathroom?  Use night lights.  Install grab bars by the toilet and in the tub and shower. Do not use towel bars as grab bars.  Use non-skid mats or decals in the tub or shower.  If you need to sit down in the shower, use a plastic, non-slip stool.  Keep the floor dry. Clean up any water that spills on the floor as soon as it happens.  Remove soap buildup in the tub or shower regularly.  Attach bath mats securely with double-sided non-slip rug tape.  Do not have throw rugs and other things on the floor that can make you trip. What can I do in the bedroom?  Use night lights.  Make sure that you have a light by your bed that  is easy to reach.  Do not use any sheets or blankets that are too big for your bed. They should not hang down onto the floor.  Have a firm chair that has side arms. You can use this for support while you get dressed.  Do not have throw rugs and other things on the floor that can make you trip. What can I do in the kitchen?  Clean up any spills right away.  Avoid walking on wet floors.  Keep items that you use a lot in easy-to-reach places.  If you need to reach something above you, use a strong step stool that has a grab bar.  Keep electrical cords out of the way.  Do not use floor polish or wax that makes floors slippery. If you must use wax, use non-skid floor wax.  Do not have  throw rugs and other things on the floor that can make you trip. What can I do with my stairs?  Do not leave any items on the stairs.  Make sure that there are handrails on both sides of the stairs and use them. Fix handrails that are broken or loose. Make sure that handrails are as long as the stairways.  Check any carpeting to make sure that it is firmly attached to the stairs. Fix any carpet that is loose or worn.  Avoid having throw rugs at the top or bottom of the stairs. If you do have throw rugs, attach them to the floor with carpet tape.  Make sure that you have a light switch at the top of the stairs and the bottom of the stairs. If you do not have them, ask someone to add them for you. What else can I do to help prevent falls?  Wear shoes that:  Do not have high heels.  Have rubber bottoms.  Are comfortable and fit you well.  Are closed at the toe. Do not wear sandals.  If you use a stepladder:  Make sure that it is fully opened. Do not climb a closed stepladder.  Make sure that both sides of the stepladder are locked into place.  Ask someone to hold it for you, if possible.  Clearly mark and make sure that you can see:  Any grab bars or handrails.  First and last steps.  Where the edge of each step is.  Use tools that help you move around (mobility aids) if they are needed. These include:  Canes.  Walkers.  Scooters.  Crutches.  Turn on the lights when you go into a dark area. Replace any light bulbs as soon as they burn out.  Set up your furniture so you have a clear path. Avoid moving your furniture around.  If any of your floors are uneven, fix them.  If there are any pets around you, be aware of where they are.  Review your medicines with your doctor. Some medicines can make you feel dizzy. This can increase your chance of falling. Ask your doctor what other things that you can do to help prevent falls. This information is not intended to  replace advice given to you by your health care provider. Make sure you discuss any questions you have with your health care provider. Document Released: 11/06/2008 Document Revised: 06/18/2015 Document Reviewed: 02/14/2014 Elsevier Interactive Patient Education  2017 [employer].

## 2019-09-09 ENCOUNTER — Other Ambulatory Visit: Payer: Self-pay

## 2019-09-09 ENCOUNTER — Encounter: Payer: Self-pay | Admitting: Endocrinology

## 2019-09-09 ENCOUNTER — Ambulatory Visit (INDEPENDENT_AMBULATORY_CARE_PROVIDER_SITE_OTHER): Payer: Medicare Other | Admitting: Endocrinology

## 2019-09-09 VITALS — BP 142/82 | HR 60 | Ht 62.0 in | Wt 145.2 lb

## 2019-09-09 DIAGNOSIS — E1122 Type 2 diabetes mellitus with diabetic chronic kidney disease: Secondary | ICD-10-CM | POA: Diagnosis not present

## 2019-09-09 DIAGNOSIS — E1165 Type 2 diabetes mellitus with hyperglycemia: Secondary | ICD-10-CM | POA: Diagnosis not present

## 2019-09-09 DIAGNOSIS — N183 Chronic kidney disease, stage 3 unspecified: Secondary | ICD-10-CM | POA: Diagnosis not present

## 2019-09-09 LAB — POCT GLYCOSYLATED HEMOGLOBIN (HGB A1C): Hemoglobin A1C: 7.3 % — AB (ref 4.0–5.6)

## 2019-09-09 MED ORDER — BROMOCRIPTINE MESYLATE 2.5 MG PO TABS: 2.5000 mg | ORAL_TABLET | Freq: Every day | ORAL | 3 refills | Status: DC

## 2019-09-09 NOTE — Patient Instructions (Addendum)
I have sent a prescription to your pharmacy, to double the bromocriptine.   check your blood sugar once a day.  vary the time of day when you check, between before the 3 meals, and at bedtime.  also check if you have symptoms of your blood sugar being too high or too low.  please keep a record of the readings and bring it to your next appointment here (or you can bring the meter itself).  You can write it on any piece of paper.  please call us sooner if your blood sugar goes below 70, or if you have a lot of readings over 200.   Please follow up with your kidney doctor, for the blood pressure.   Please come back for a follow-up appointment in 3 months.

## 2019-09-09 NOTE — Progress Notes (Signed)
Subjective:    Patient ID: [name], female    DOB: 1950-05-01, 69 y.o.   MRN: [id number]  HPI Pt returns for f/u of diabetes mellitus:  DM type: 2, but she may be developing type 1 Dx'ed: 0938 Complications: polyneuropathy, renal insuff, CVA's, and PDR.  Therapy: 2 oral meds GDM: never DKA: never Severe hypoglycemia: never.   Pancreatitis: never Pancreatic imaging: never Other: she cannot afford brand-name meds; she has never been on insulin, but she has taken trulicity; edema limits rx options.   Interval history:  no cbg record, but states cbg's are well-controlled.  pt states she feels well in general.   Past Medical History:  Diagnosis Date  . Acute kidney injury ([place]) 01/08/2015  . Arthritis   . Chronic back pain   . Constipation   . Cough   . Diabetes mellitus, type 2 ([place])   . Diverticulosis   . Fibroid    patient thinks this was the reason for her hysterectomy  . GERD (gastroesophageal reflux disease)   . Heart murmur   . History of sebaceous cyst   . Hyperlipemia   . Hyperplastic colon polyp   . Hypertension   . Hyperthyroidism   . Lumbar radiculopathy   . Shortness of breath 09/13/2013  . Stroke ([place]) 2015?   x4   . Tobacco abuse   . 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 ABDOMINAL HYSTERECTOMY     --?ovaries remain  . COLONOSCOPY W/ BIOPSIES  09/11/2008  . RETINOPATHY SURGERY Bilateral 2013   Dr. Zigmund Daniel    Social History   Socioeconomic History  . Marital status: Divorced    Spouse name: Not on file  . Number of children: 1  . Years of education: 11  . Highest education level: Not on file  Occupational History  . 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 Smoking status: Former Smoker    Packs/day: 0.50    Years: 35.00    Pack years: 17.50    Types: Cigarettes    Quit date: 07/07/2014     Years since quitting: 5.1  . Smokeless tobacco: Never Used  Vaping Use  . Vaping Use: Never used  Substance and Sexual Activity  . Alcohol use: No  . Drug use: No  . Sexual activity: Never    Partners: Male    Birth control/protection: Surgical    Comment: TAH--?ovaries remain  Other Topics Concern  . Not on file  Social History Narrative   Divorced 1 son    Retired Museum/gallery curator for [employer]   Former smoker no alcohol caffeine or drug use report denies abuse and feels safe at home.   Social Determinants of Health   Financial Resource Strain: Medium Risk  . Difficulty of Paying Living Expenses: Somewhat hard  Food Insecurity: Food Insecurity Present  . Worried About Charity fundraiser in the Last Year: Often true  . Ran Out of Food in the Last Year: Sometimes true  Transportation Needs: No Transportation Needs  . Lack of Transportation (Medical): No  . Lack of Transportation (Non-Medical): No  Physical Activity: Sufficiently Active  . Days of Exercise per Week: 5 days  . Minutes of Exercise per Session: 30 min  Stress: No Stress Concern Present  . Feeling of Stress : Not at all  Social Connections:   . Frequency of Communication with Friends and Family:   . Frequency of Social Gatherings with Friends and Family:   . Attends Religious Services:   . Active Member of Clubs or Organizations:   . Attends Archivist Meetings:   [name] Marital Status:   Intimate Partner Violence: Not At Risk  . Fear of Current or Ex-Partner: No  . Emotionally Abused: No  . Physically Abused: No  . Sexually Abused: No    Current Outpatient Medications on File Prior to Visit  Medication Sig Dispense Refill  . amLODipine (NORVASC) 10 MG tablet Take 1 tablet (10 mg total) by mouth daily. 90 tablet 3  . aspirin 325 MG tablet Take 325 mg by mouth daily.    [name] atorvastatin (LIPITOR) 80 MG tablet TAKE 1 TABLET BY MOUTH EVERY DAY 90 tablet 3  . Blood Glucose Monitoring Suppl  (ONETOUCH VERIO FLEX SYSTEM) w/Device KIT 1 each by Does not apply route 2 (two) times daily. E11.9    . calcitRIOL (ROCALTROL) 0.25 MCG capsule 1 (ONE) CAPSULE THREE TIMES A WEEK    . cloNIDine (CATAPRES) 0.2 MG tablet Take 1 tablet (0.2 mg total) by mouth 3 (three) times daily. 270 tablet 3  . furosemide (LASIX) 40 MG tablet Take 1 tablet (40 mg total) by mouth See admin instructions. Take 1 tablet daily, can take another pill if ankles are swelling -Hold for the next 3 days and resume on 09/13/2017 30 tablet   . glucose blood (ONETOUCH VERIO) test strip USE AS INSTRUCTED (TESTS 2 TIMES A DAY) 100 each 5  . metoprolol succinate (TOPROL-XL) 100 MG 24 hr tablet Take 1 tablet (100 mg total) by mouth daily. for high blood pressure 90 tablet 3  . minoxidil (LONITEN) 2.5 MG tablet Take 2.5 mg by mouth 2 (two) times daily.    [name] Delica Lancets 24M MISC 1 each by Does not apply route 2 (two) times daily. E11.9    . pantoprazole (PROTONIX) 40 MG tablet Take 1 tablet (40 mg total) by mouth daily. 90 tablet 3  . repaglinide (PRANDIN) 2 MG tablet TAKE 1 TABLET (2 MG TOTAL) BY MOUTH 3 (THREE) TIMES DAILY BEFORE MEALS. 270 tablet 3  . telmisartan-hydrochlorothiazide (MICARDIS HCT) 80-25 MG tablet Take 1 tablet by mouth daily. 90 tablet 3  . vitamin B-12 (CYANOCOBALAMIN) 100 MCG tablet Take 1 tablet (100 mcg total) by mouth at bedtime. 90 tablet 3  . Vitamin D, Ergocalciferol, (DRISDOL) 1.25 MG (50000 UNIT) CAPS capsule Take 50,000 Units by mouth once a week.     No current facility-administered medications on file prior to visit.    No Known Allergies  Family History  Problem Relation Age of Onset  . Hypertension Mother   . Sleep apnea Mother   . Diabetes Mother   . Hyperlipidemia Mother   . Lung cancer Father        smoker  . Hypertension Sister   . Diabetes Sister   . Hypertension Brother   . Hypertension Sister   . Diabetes Brother   . Hypertension Brother   . Breast cancer Neg Hx     . Colon cancer Neg Hx   . Esophageal cancer Neg Hx   . Pancreatic cancer Neg Hx   . Liver disease Neg Hx   . Colon polyps Neg Hx     BP (!) 142/82   Pulse 60   Ht '5\' 2"'$  (1.575 m)   Wt 145 lb 3.2  oz (65.9 kg)   SpO2 98%   BMI 26.56 kg/m    Review of Systems     Objective:   Physical Exam VITAL SIGNS:  See vs page GENERAL: no distress Pulses: dorsalis pedis intact bilat.   MSK: no deformity of the feet CV: trace bilat leg edema Skin:  no ulcer on the feet.  normal color and temp on the feet. Neuro: sensation is intact to touch on the feet  Lab Results  Component Value Date   HGBA1C 7.3 (A) 09/09/2019    Lab Results  Component Value Date   CREATININE 2.28 (H) 07/11/2018   BUN 97 (HH) 07/11/2018   NA 132 (L) 07/11/2018   K 3.5 07/11/2018   CL 95 (L) 07/11/2018   CO2 28 07/11/2018       Assessment & Plan:  Type 2 DM, with PN: worse.  HTN: is noted today   Patient Instructions  I have sent a prescription to your pharmacy, to double the bromocriptine.   check your blood sugar once a day.  vary the time of day when you check, between before the 3 meals, and at bedtime.  also check if you have symptoms of your blood sugar being too high or too low.  please keep a record of the readings and bring it to your next appointment here (or you can bring the meter itself).  You can write it on any piece of paper.  please call us sooner if your blood sugar goes below 70, or if you have a lot of readings over 200.   Please follow up with your kidney doctor, for the blood pressure.   Please come back for a follow-up appointment in 3 months.

## 2019-09-12 ENCOUNTER — Ambulatory Visit (HOSPITAL_COMMUNITY)
Admission: RE | Admit: 2019-09-12 | Discharge: 2019-09-12 | Disposition: A | Discharge status: Home / Self Care | Payer: Medicare Other | Source: Ambulatory Visit | Attending: Nephrology | Admitting: Nephrology

## 2019-09-12 ENCOUNTER — Other Ambulatory Visit: Payer: Self-pay

## 2019-09-12 VITALS — BP 138/66 | HR 57 | Temp 98.0°F | Resp 18

## 2019-09-12 DIAGNOSIS — N183 Chronic kidney disease, stage 3 unspecified: Secondary | ICD-10-CM | POA: Diagnosis not present

## 2019-09-12 DIAGNOSIS — D631 Anemia in chronic kidney disease: Secondary | ICD-10-CM | POA: Diagnosis not present

## 2019-09-12 LAB — IRON AND TIBC
Iron: 78 ug/dL (ref 28–170)
Saturation Ratios: 27 % (ref 10.4–31.8)
TIBC: 291 ug/dL (ref 250–450)
UIBC: 213 ug/dL

## 2019-09-12 LAB — FERRITIN: Ferritin: 86 ng/mL (ref 11–307)

## 2019-09-12 LAB — POCT HEMOGLOBIN-HEMACUE: Hemoglobin: 10.7 g/dL — ABNORMAL LOW (ref 12.0–15.0)

## 2019-09-12 MED ORDER — EPOETIN ALFA-EPBX 40000 UNIT/ML IJ SOLN
INTRAMUSCULAR | Status: AC
  Filled 2019-09-12: qty 1

## 2019-09-12 MED ORDER — EPOETIN ALFA-EPBX 40000 UNIT/ML IJ SOLN
40000.0000 [IU] | INTRAMUSCULAR | Status: DC
  Administered 2019-09-12: 40000 [IU] via SUBCUTANEOUS

## 2019-09-13 ENCOUNTER — Ambulatory Visit: Payer: Medicare Other | Admitting: Family

## 2019-09-13 DIAGNOSIS — Z0289 Encounter for other administrative examinations: Secondary | ICD-10-CM

## 2019-09-24 ENCOUNTER — Ambulatory Visit (INDEPENDENT_AMBULATORY_CARE_PROVIDER_SITE_OTHER): Payer: Medicare Other | Admitting: Family

## 2019-09-24 ENCOUNTER — Other Ambulatory Visit: Payer: Self-pay

## 2019-09-24 ENCOUNTER — Ambulatory Visit (INDEPENDENT_AMBULATORY_CARE_PROVIDER_SITE_OTHER): Payer: Medicare Other

## 2019-09-24 VITALS — BP 130/70 | HR 64 | Temp 98.6°F | Ht 62.0 in | Wt 138.0 lb

## 2019-09-24 DIAGNOSIS — R634 Abnormal weight loss: Secondary | ICD-10-CM

## 2019-09-24 DIAGNOSIS — R300 Dysuria: Secondary | ICD-10-CM

## 2019-09-24 LAB — POC URINALSYSI DIPSTICK (AUTOMATED)
Bilirubin, UA: NEGATIVE
Blood, UA: POSITIVE
Glucose, UA: NEGATIVE
Ketones, UA: NEGATIVE
Leukocytes, UA: NEGATIVE
Nitrite, UA: NEGATIVE
Protein, UA: POSITIVE — AB
Spec Grav, UA: 1.02 (ref 1.010–1.025)
Urobilinogen, UA: 0.2 E.U./dL
pH, UA: 5.5 (ref 5.0–8.0)

## 2019-09-24 MED ORDER — SULFAMETHOXAZOLE-TRIMETHOPRIM 800-160 MG PO TABS: 1.0000 | ORAL_TABLET | Freq: Two times a day (BID) | ORAL | 0 refills | Status: DC

## 2019-09-24 NOTE — Progress Notes (Signed)
[name] is a 69 y.o. female with the following history as recorded in EpicCare:  Patient Active Problem List   Diagnosis Date Noted  . Vitamin D toxicity, accidental or unintentional, initial encounter 09/09/2017  . Ataxia 09/09/2017  . Type 2 diabetes mellitus with hyperglycemia ([place]) 09/09/2017  . Chronic kidney disease (CKD)   . Olecranon bursitis of right elbow 03/09/2017  . Thyroid nodule 06/29/2016  . Type 2 diabetes mellitus ([place]) 06/13/2016  . Vitamin D deficiency 01/12/2016  . Medicare annual wellness visit, initial 01/11/2016  . Lacunar infarct, acute ([place]) 03/23/2015  . Non compliance w medication regimen 01/16/2015  . Anemia 01/08/2015  . Stroke ([place]) 01/08/2015  . Hyperlipemia   . Carotid bruit present 12/13/2012  . Routine health maintenance 10/24/2010  . PERIPHERAL EDEMA 09/10/2009  . ELECTROCARDIOGRAM, ABNORMAL 04/11/2008  . HEART MURMUR, SYSTOLIC 44/31/5400  . SEBACEOUS CYST, NECK 12/13/2006  . Hyperthyroidism 09/27/2006  . Secondary diabetes with peripheral vascular disease ([place]) 09/27/2006  . Hyperlipidemia LDL goal <70 09/27/2006  . TOBACCO ABUSE 09/27/2006  . Essential hypertension 09/27/2006    Current Outpatient Medications  Medication Sig Dispense Refill  . amLODipine (NORVASC) 10 MG tablet Take 1 tablet (10 mg total) by mouth daily. 90 tablet 3  . aspirin 325 MG tablet Take 325 mg by mouth daily.    [name] atorvastatin (LIPITOR) 80 MG tablet TAKE 1 TABLET BY MOUTH EVERY DAY 90 tablet 3  . Blood Glucose Monitoring Suppl (ONETOUCH VERIO FLEX SYSTEM) w/Device KIT 1 each by Does not apply route 2 (two) times daily. E11.9    . bromocriptine (PARLODEL) 2.5 MG tablet Take 1 tablet (2.5 mg total) by mouth daily. 90 tablet 3  . calcitRIOL (ROCALTROL) 0.25 MCG capsule 1 (ONE) CAPSULE THREE TIMES A WEEK    . cloNIDine (CATAPRES) 0.2 MG tablet Take 1 tablet (0.2 mg total) by mouth 3 (three) times daily. 270 tablet 3  . furosemide (LASIX) 40 MG tablet Take 1  tablet (40 mg total) by mouth See admin instructions. Take 1 tablet daily, can take another pill if ankles are swelling -Hold for the next 3 days and resume on 09/13/2017 30 tablet   . glucose blood (ONETOUCH VERIO) test strip USE AS INSTRUCTED (TESTS 2 TIMES A DAY) 100 each 5  . metoprolol succinate (TOPROL-XL) 100 MG 24 hr tablet Take 1 tablet (100 mg total) by mouth daily. for high blood pressure 90 tablet 3  . minoxidil (LONITEN) 2.5 MG tablet Take 2.5 mg by mouth 2 (two) times daily.    [name] Delica Lancets 86P MISC 1 each by Does not apply route 2 (two) times daily. E11.9    . pantoprazole (PROTONIX) 40 MG tablet Take 1 tablet (40 mg total) by mouth daily. 90 tablet 3  . repaglinide (PRANDIN) 2 MG tablet TAKE 1 TABLET (2 MG TOTAL) BY MOUTH 3 (THREE) TIMES DAILY BEFORE MEALS. 270 tablet 3  . telmisartan-hydrochlorothiazide (MICARDIS HCT) 80-25 MG tablet Take 1 tablet by mouth daily. 90 tablet 3  . vitamin B-12 (CYANOCOBALAMIN) 100 MCG tablet Take 1 tablet (100 mcg total) by mouth at bedtime. 90 tablet 3  . Vitamin D, Ergocalciferol, (DRISDOL) 1.25 MG (50000 UNIT) CAPS capsule Take 50,000 Units by mouth once a week.    . sulfamethoxazole-trimethoprim (BACTRIM DS) 800-160 MG tablet Take 1 tablet by mouth 2 (two) times daily. 10 tablet 0   No current facility-administered medications for this visit.    Allergies: Patient has no known allergies.  Past Medical History:  Diagnosis Date  . Acute kidney injury (HCC) 01/08/2015  . Arthritis   . Chronic back pain   . Constipation   . Cough   . Diabetes mellitus, type 2 (HCC)   . Diverticulosis   . Fibroid    patient thinks this was the reason for her hysterectomy  . GERD (gastroesophageal reflux disease)   . Heart murmur   . History of sebaceous cyst   . Hyperlipemia   . Hyperplastic colon polyp   . Hypertension   . Hyperthyroidism   . Lumbar radiculopathy   . Shortness of breath 09/13/2013  . Stroke (HCC) 2015?   x4   . Tobacco  abuse   . 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 ABDOMINAL HYSTERECTOMY     --?ovaries remain  . COLONOSCOPY W/ BIOPSIES  09/11/2008  . RETINOPATHY SURGERY Bilateral 2013   Dr. Ashley Royalty    Family History  Problem Relation Age of Onset  . Hypertension Mother   . Sleep apnea Mother   . Diabetes Mother   . Hyperlipidemia Mother   . Lung cancer Father        smoker  . Hypertension Sister   . Diabetes Sister   . Hypertension Brother   . Hypertension Sister   . Diabetes Brother   . Hypertension Brother   . Breast cancer Neg Hx   . Colon cancer Neg Hx   . Esophageal cancer Neg Hx   . Pancreatic cancer Neg Hx   . Liver disease Neg Hx   . Colon polyps Neg Hx     Social History   Tobacco Use  . Smoking status: Former Smoker    Packs/day: 0.50    Years: 35.00    Pack years: 17.50    Types: Cigarettes    Quit date: 07/07/2014    Years since quitting: 5.2  . Smokeless tobacco: Never Used  Substance Use Topics  . Alcohol use: No    Subjective:  Complaining of urinary odor x 1 month; denies any blood in urine; occasional burning with urination;  Overdue to see her nephrologist;  Also notes she is concerned about the weight loss that is noted today; does admit that she is not eating regularly however;     Objective:  Vitals:   09/24/19 0959  BP: 130/70  Pulse: 64  Temp: 98.6 F (37 C)  TempSrc: Oral  SpO2: 99%  Weight: 138 lb (62.6 kg)  Height: 5\' 2"  (1.575 m)    General: Well developed, well nourished, in no acute distress  Skin : Warm and dry.  Head: Normocephalic and atraumatic  Eyes: Sclera and conjunctiva clear; pupils round and reactive to light; extraocular movements intact  Ears: External normal; canals clear; tympanic membranes normal  Oropharynx: Pink, supple. No suspicious lesions  Neck: Supple without thyromegaly, adenopathy  Lungs: Respirations unlabored; clear to  auscultation bilaterally without wheeze, rales, rhonchi  CVS exam: normal rate and regular rhythm.  Abdomen: Soft; nontender; nondistended; normoactive bowel sounds; no masses or hepatosplenomegaly  Musculoskeletal: No deformities; no active joint inflammation  Extremities: No edema, cyanosis, clubbing  Vessels: Symmetric bilaterally  Neurologic: Alert and oriented; speech intact; face symmetrical; moves all extremities well; CNII-XII intact without focal deficit  Assessment:  1. Dysuria   2. Weight loss     Plan:  1. ? Infection; check urine culture today; Rx for Bactrim DS  bid x 5 days; follow up to be determined; she is encouraged to see her nephrologist for regular follow-up due to the protein noted today as well; 2. ? Due to decreased caloric intake; check labs and CXR today; follow-up to be determined; encouraged to try and eat regular meals;  This visit occurred during the SARS-CoV-2 public health emergency.  Safety protocols were in place, including screening questions prior to the visit, additional usage of staff PPE, and extensive cleaning of exam room while observing appropriate contact time as indicated for disinfecting solutions.     No follow-ups on file.  Orders Placed This Encounter  Procedures  . Urine Culture    Standing Status:   Future    Number of Occurrences:   1    Standing Expiration Date:   09/23/2020  . DG Chest 2 View    Standing Status:   Future    Number of Occurrences:   1    Standing Expiration Date:   09/23/2020    Order Specific Question:   Reason for Exam (SYMPTOM  OR DIAGNOSIS REQUIRED)    Answer:   weight loss    Order Specific Question:   Preferred imaging location?    Answer:   [facility]    Order Specific Question:   Radiology Contrast Protocol - do NOT remove file path    Answer:   [email or web address]  . CBC with Differential/Platelet    Standing Status:   Future    Number of  Occurrences:   1    Standing Expiration Date:   09/23/2020  . Comp Met (CMET)    Standing Status:   Future    Number of Occurrences:   1    Standing Expiration Date:   09/23/2020  . TSH    Standing Status:   Future    Number of Occurrences:   1    Standing Expiration Date:   09/23/2020  . POCT Urinalysis Dipstick (Automated)    Requested Prescriptions   Signed Prescriptions Disp Refills  . sulfamethoxazole-trimethoprim (BACTRIM DS) 800-160 MG tablet 10 tablet 0    Sig: Take 1 tablet by mouth 2 (two) times daily.

## 2019-09-25 LAB — CBC WITH DIFFERENTIAL/PLATELET
Absolute Monocytes: 708 cells/uL (ref 200–950)
Basophils Absolute: 73 cells/uL (ref 0–200)
Basophils Relative: 1 %
Eosinophils Absolute: 226 cells/uL (ref 15–500)
Eosinophils Relative: 3.1 %
HCT: 40.5 % (ref 35.0–45.0)
Hemoglobin: 13.3 g/dL (ref 11.7–15.5)
Lymphs Abs: 2533 cells/uL (ref 850–3900)
MCH: 30.6 pg (ref 27.0–33.0)
MCHC: 32.8 g/dL (ref 32.0–36.0)
MCV: 93.1 fL (ref 80.0–100.0)
MPV: 11.3 fL (ref 7.5–12.5)
Monocytes Relative: 9.7 %
Neutro Abs: 3760 cells/uL (ref 1500–7800)
Neutrophils Relative %: 51.5 %
Platelets: 298 10*3/uL (ref 140–400)
RBC: 4.35 10*6/uL (ref 3.80–5.10)
RDW: 13.9 % (ref 11.0–15.0)
Total Lymphocyte: 34.7 %
WBC: 7.3 10*3/uL (ref 3.8–10.8)

## 2019-09-25 LAB — COMPREHENSIVE METABOLIC PANEL
AG Ratio: 1 (calc) (ref 1.0–2.5)
ALT: 19 U/L (ref 6–29)
AST: 22 U/L (ref 10–35)
Albumin: 3.9 g/dL (ref 3.6–5.1)
Alkaline phosphatase (APISO): 95 U/L (ref 37–153)
BUN/Creatinine Ratio: 22 (calc) (ref 6–22)
BUN: 46 mg/dL — ABNORMAL HIGH (ref 7–25)
CO2: 28 mmol/L (ref 20–32)
Calcium: 9.3 mg/dL (ref 8.6–10.4)
Chloride: 100 mmol/L (ref 98–110)
Creat: 2.09 mg/dL — ABNORMAL HIGH (ref 0.50–0.99)
Globulin: 3.8 g/dL (calc) — ABNORMAL HIGH (ref 1.9–3.7)
Glucose, Bld: 64 mg/dL — ABNORMAL LOW (ref 65–99)
Potassium: 3.5 mmol/L (ref 3.5–5.3)
Sodium: 140 mmol/L (ref 135–146)
Total Bilirubin: 1.1 mg/dL (ref 0.2–1.2)
Total Protein: 7.7 g/dL (ref 6.1–8.1)

## 2019-09-25 LAB — URINE CULTURE: Result:: NO GROWTH

## 2019-09-25 LAB — TSH: TSH: 1.83 mIU/L (ref 0.40–4.50)

## 2019-10-02 ENCOUNTER — Other Ambulatory Visit: Payer: Self-pay

## 2019-10-02 ENCOUNTER — Ambulatory Visit
Admission: RE | Admit: 2019-10-02 | Discharge: 2019-10-02 | Disposition: A | Discharge status: Home / Self Care | Payer: Medicare Other | Source: Ambulatory Visit

## 2019-10-02 DIAGNOSIS — Z1231 Encounter for screening mammogram for malignant neoplasm of breast: Secondary | ICD-10-CM

## 2019-10-03 ENCOUNTER — Other Ambulatory Visit: Payer: Self-pay | Admitting: Family

## 2019-10-03 ENCOUNTER — Telehealth: Payer: Self-pay | Admitting: Family

## 2019-10-03 NOTE — Telephone Encounter (Signed)
   SF 10/03/2019   Name: [name]   MRN: [id number]   DOB: Jan 21, 1951   AGE: 69 y.o.   GENDER: female   PCP Marrian Salvage, [place].   Called pt regarding [employer] Referral for food insecurities. Patient stated that currently she is able to pay all of her bills and does not need financial assistance at this time. However, she is still interested in applying for Food and Nutrition Services at [facility]. Informed patient that she can give their office a call at [phone] and let them know what she would like to apply for. Explained to patient this process will be the quickest way to get her application started for FNS. Patient stated understanding and said she will give [facility] a call today. She stated she has no additional needs at this time and she will give the office a call if/when she may need anything else.   Closing referral pending any other needs of patient.   [place], Care Management Phone: [phone] Email: [email or web address]

## 2019-10-10 ENCOUNTER — Other Ambulatory Visit: Payer: Self-pay

## 2019-10-10 ENCOUNTER — Ambulatory Visit (HOSPITAL_COMMUNITY)
Admission: RE | Admit: 2019-10-10 | Discharge: 2019-10-10 | Disposition: A | Discharge status: Home / Self Care | Payer: Medicare Other | Source: Ambulatory Visit | Attending: Nephrology | Admitting: Nephrology

## 2019-10-10 DIAGNOSIS — D509 Iron deficiency anemia, unspecified: Secondary | ICD-10-CM | POA: Diagnosis not present

## 2019-10-10 DIAGNOSIS — N183 Chronic kidney disease, stage 3 unspecified: Secondary | ICD-10-CM | POA: Diagnosis not present

## 2019-10-10 DIAGNOSIS — I129 Hypertensive chronic kidney disease with stage 1 through stage 4 chronic kidney disease, or unspecified chronic kidney disease: Secondary | ICD-10-CM | POA: Insufficient documentation

## 2019-10-10 LAB — IRON AND TIBC
Iron: 95 ug/dL (ref 28–170)
Saturation Ratios: 35 % — ABNORMAL HIGH (ref 10.4–31.8)
TIBC: 269 ug/dL (ref 250–450)
UIBC: 174 ug/dL

## 2019-10-10 LAB — FERRITIN: Ferritin: 135 ng/mL (ref 11–307)

## 2019-10-10 LAB — POCT HEMOGLOBIN-HEMACUE: Hemoglobin: 10.4 g/dL — ABNORMAL LOW (ref 12.0–15.0)

## 2019-10-10 MED ORDER — EPOETIN ALFA-EPBX 40000 UNIT/ML IJ SOLN
40000.0000 [IU] | INTRAMUSCULAR | Status: DC
  Administered 2019-10-10: 40000 [IU] via SUBCUTANEOUS

## 2019-10-10 MED ORDER — EPOETIN ALFA-EPBX 40000 UNIT/ML IJ SOLN
INTRAMUSCULAR | Status: AC
  Filled 2019-10-10: qty 1

## 2019-10-22 DIAGNOSIS — E1122 Type 2 diabetes mellitus with diabetic chronic kidney disease: Secondary | ICD-10-CM | POA: Diagnosis not present

## 2019-10-22 DIAGNOSIS — I129 Hypertensive chronic kidney disease with stage 1 through stage 4 chronic kidney disease, or unspecified chronic kidney disease: Secondary | ICD-10-CM | POA: Diagnosis not present

## 2019-10-22 DIAGNOSIS — D631 Anemia in chronic kidney disease: Secondary | ICD-10-CM | POA: Diagnosis not present

## 2019-10-22 DIAGNOSIS — N184 Chronic kidney disease, stage 4 (severe): Secondary | ICD-10-CM | POA: Diagnosis not present

## 2019-10-22 DIAGNOSIS — R809 Proteinuria, unspecified: Secondary | ICD-10-CM | POA: Diagnosis not present

## 2019-10-24 ENCOUNTER — Other Ambulatory Visit (HOSPITAL_COMMUNITY): Payer: Self-pay | Admitting: Nephrology

## 2019-10-24 DIAGNOSIS — N184 Chronic kidney disease, stage 4 (severe): Secondary | ICD-10-CM

## 2019-10-24 DIAGNOSIS — R809 Proteinuria, unspecified: Secondary | ICD-10-CM

## 2019-10-29 ENCOUNTER — Telehealth: Payer: Medicare Other

## 2019-10-29 NOTE — Chronic Care Management (AMB) (Deleted)
Chronic Care Management Pharmacy  Name: [name]  MRN: [id number] DOB: [birth year]   Chief Complaint/ HPI  [name],  69 y.o. , female presents for their Initial CCM visit with the clinical pharmacist via telephone due to COVID-19 Pandemic.  PCP : Marrian Salvage, [place] Patient Care Team: Marrian Salvage, [place] as PCP - General (Internal Medicine) Hayden Pedro, MD (Ophthalmology) Charlton Haws, [facility] as Pharmacist (Pharmacist)  Their chronic conditions include: Hypertension, Hyperlipidemia, Diabetes, Chronic Kidney Disease and Tobacco use, Hx stroke,   Office Visits: 09/24/19 NP Jodi Mourning OV: acute visit for UTI, rx'd Bactrim x 5 days, encouraged to f/u with nephrologist  Consult Visit: 09/09/19 Dr Loanne Drilling (endocrine): increased bromocriptine to 2.5 mg 06/03/19 Dr Buelah Manis (nephrology): f/u for CKD  No Known Allergies  Medications: Outpatient Encounter Medications as of 10/29/2019  Medication Sig Note   amLODipine (NORVASC) 10 MG tablet Take 1 tablet (10 mg total) by mouth daily.    aspirin 325 MG tablet Take 325 mg by mouth daily.    atorvastatin (LIPITOR) 80 MG tablet TAKE 1 TABLET BY MOUTH EVERY DAY    Blood Glucose Monitoring Suppl ([place]) w/Device KIT 1 each by Does not apply route 2 (two) times daily. E11.9    bromocriptine (PARLODEL) 2.5 MG tablet Take 1 tablet (2.5 mg total) by mouth daily.    calcitRIOL (ROCALTROL) 0.25 MCG capsule 1 (ONE) CAPSULE THREE TIMES A WEEK    cloNIDine (CATAPRES) 0.2 MG tablet Take 1 tablet (0.2 mg total) by mouth 3 (three) times daily.    furosemide (LASIX) 40 MG tablet Take 1 tablet (40 mg total) by mouth See admin instructions. Take 1 tablet daily, can take another pill if ankles are swelling -Hold for the next 3 days and resume on 09/13/2017 12/19/2018: States "my kidney dr took me off of that. Haven't taken that in 1 month"   glucose blood (ONETOUCH VERIO) test strip USE AS  INSTRUCTED (TESTS 2 TIMES A DAY)    metoprolol succinate (TOPROL-XL) 100 MG 24 hr tablet Take 1 tablet (100 mg total) by mouth daily. for high blood pressure    minoxidil (LONITEN) 2.5 MG tablet Take 2.5 mg by mouth 2 (two) times daily.    OneTouch Delica Lancets 11X MISC 1 each by Does not apply route 2 (two) times daily. E11.9    pantoprazole (PROTONIX) 40 MG tablet Take 1 tablet (40 mg total) by mouth daily.    repaglinide (PRANDIN) 2 MG tablet TAKE 1 TABLET (2 MG TOTAL) BY MOUTH 3 (THREE) TIMES DAILY BEFORE MEALS.    sulfamethoxazole-trimethoprim (BACTRIM DS) 800-160 MG tablet Take 1 tablet by mouth 2 (two) times daily.    telmisartan-hydrochlorothiazide (MICARDIS HCT) 80-25 MG tablet TAKE 1 TABLET BY MOUTH EVERY DAY    vitamin B-12 (CYANOCOBALAMIN) 100 MCG tablet Take 1 tablet (100 mcg total) by mouth at bedtime.    Vitamin D, Ergocalciferol, (DRISDOL) 1.25 MG (50000 UNIT) CAPS capsule Take 50,000 Units by mouth once a week.    No facility-administered encounter medications on file as of 10/29/2019.    Wt Readings from Last 3 Encounters:  09/24/19 138 lb (62.6 kg)  09/09/19 145 lb 3.2 oz (65.9 kg)  02/04/19 142 lb 6.4 oz (64.6 kg)    Current Diagnosis/Assessment:    Goals Addressed   None     Hypertension   BP goal is:  <130/80  Office blood pressures are  BP Readings from Last 3 Encounters:  10/10/19 (!) 133/58  09/24/19 130/70  09/12/19 138/66   Kidney Function Lab Results  Component Value Date/Time   CREATININE 2.09 (H) 09/24/2019 10:44 AM   CREATININE 2.28 (H) 07/11/2018 12:58 PM   CREATININE 2.03 (H) 09/13/2017 01:57 PM   GFR 25.74 (L) 07/11/2018 12:58 PM   GFRNONAA 21 (L) 09/09/2017 06:34 AM   GFRAA 25 (L) 09/09/2017 06:34 AM   K 3.5 09/24/2019 10:44 AM   K 3.5 07/11/2018 12:58 PM    Patient checks BP at home {CHL HP BP Monitoring Frequency:[id number]} Patient home BP readings are ranging: ***  Patient has failed these meds in the past:  *** Patient is currently {CHL Controlled/Uncontrolled:[phone]} on the following medications:   Amlodipine 10 mg daily  Clonidine 0.2 mg TID  Furosemide 40 mg daily  Metoprolol succinate 100 mg daily  Telmisartan-HCTZ 80-25 mg daily  Minoxidil 2.5 mg BID  We discussed {CHL HP Upstream Pharmacy discussion:[phone]}  Plan  Continue {CHL HP Upstream Pharmacy Plans:[phone]}     Hyperlipidemia    LDL goal < 70 Hx stroke, PVD  Lipid Panel     Component Value Date/Time   CHOL 155 07/11/2018 1258   TRIG 151.0 (H) 07/11/2018 1258   HDL 44.70 07/11/2018 1258   LDLCALC 80 07/11/2018 1258   LDLDIRECT 79.0 07/10/2017 1359    Hepatic Function Latest Ref Rng & Units 09/24/2019 07/11/2018 09/09/2017  Total Protein 6.1 - 8.1 g/dL 7.7 7.6 6.8  Albumin 3.5 - 5.2 g/dL - 4.1 3.0(L)  AST 10 - 35 U/L $Remo'22 13 25  'NdZdq$ ALT 6 - 29 U/L $Remo'19 12 22  'ueVTY$ Alk Phosphatase 39 - 117 U/L - 71 67  Total Bilirubin 0.2 - 1.2 mg/dL 1.1 0.8 1.2  Bilirubin, Direct 0.0 - 0.2 mg/dL - - 0.2     The ASCVD Risk score ([place]., et al., 2013) failed to calculate for the following reasons:   The patient has a prior MI or stroke diagnosis   Patient has failed these meds in past: *** Patient is currently {CHL Controlled/Uncontrolled:[phone]} on the following medications:   Atorvastatin 80 mg daily  Aspirin 325 mg daily  We discussed:  {CHL HP Upstream Pharmacy discussion:[phone]}  Plan  Continue {CHL HP Upstream Pharmacy Plans:[phone]}  Diabetes   A1c goal <7%  Recent Relevant Labs: Lab Results  Component Value Date/Time   HGBA1C 7.3 (A) 09/09/2019 09:15 AM   HGBA1C 6.4 (A) 01/08/2019 08:41 AM   HGBA1C 9.3 (H) 06/13/2016 11:30 AM   HGBA1C 10.0 (H) 01/11/2016 09:48 AM   GFR 25.74 (L) 07/11/2018 12:58 PM   GFR 31.36 (L) 09/13/2017 01:57 PM   MICROALBUR 12.8 (H) 08/28/2015 11:49 AM   MICROALBUR 140.0 Verified by manual dilution. mg/dL (H) 04/09/2008 09:37 AM    Last diabetic Eye exam:   Lab Results  Component Value Date/Time   HMDIABEYEEXA No Retinopathy 02/01/2018 12:00 AM    Last diabetic Foot exam: No results found for: HMDIABFOOTEX   Checking BG: {CHL HP Blood Glucose Monitoring [phone]}  Recent FBG Readings: *** Recent pre-meal BG readings: *** Recent 2hr PP BG readings:  *** Recent HS BG readings: ***  Patient has failed these meds in past: Trulicity, Tradjenta, metformin, Januvia Patient is currently {CHL Controlled/Uncontrolled:[phone]} on the following medications:  Repaglinide 2 mg TID AC  Bromocriptine 2.5 mg daily  Testing supplies  We discussed: {CHL HP Upstream Pharmacy discussion:[phone]}  Plan  Continue {CHL HP Upstream Pharmacy Plans:[phone]}  Chronic kidney disease   Managed per Dr  Lorrene Reid  Patient has failed these meds in past: *** Patient is currently {CHL Controlled/Uncontrolled:[phone]} on the following medications:   Calcitriol 0.25 mcg 3 times weekly  We discussed:  ***  Plan  Continue {CHL HP Upstream Pharmacy Plans:[phone]}  GERD   Patient has failed these meds in past: *** Patient is currently {CHL Controlled/Uncontrolled:[phone]} on the following medications:   Pantoprazole 40 mg daily  Retacrit 40000 units every 28 days  We discussed:  ***  Plan  Continue {CHL HP Upstream Pharmacy OQHUT:[id number]}  Vaccines   Reviewed and discussed patient's vaccination history.    Immunization History  Administered Date(s) Administered   Influenza Whole 10/25/2011   Influenza, High Dose Seasonal PF 01/11/2016, 12/05/2016, 12/15/2017   Influenza,inj,Quad PF,6+ Mos 12/13/2012, 10/07/2014   PFIZER SARS-COV-2 Vaccination 03/21/2019, 04/18/2019   Pneumococcal Conjugate-13 08/28/2015   Pneumococcal Polysaccharide-23 12/13/2011, 07/07/2017   Td 09/09/2008   Zoster 12/13/2012    Plan  Recommended patient receive *** vaccine    Health Maintenance   Lab Results  Component  Value Date/Time   VD25OH 33.21 07/11/2018 12:58 PM   VD25OH >120.00 ([place]) 09/08/2017 03:15 PM   VITAMINB12 453 09/09/2017 06:34 AM   VITAMINB12 462 09/08/2017 03:15 PM   Patient is currently {CHL Controlled/Uncontrolled:[phone]} on the following medications:   Vitamin B12 100 mcg daily  Vitamin D 50,000 IU weekly  We discussed:  ***  Plan  Continue {CHL HP Upstream Pharmacy Plans:[phone]}  Medication Management   Pt [facility] medications Uses pill box? {Yes or If no, why not?:20788} Pt endorses ***% compliance  We discussed: {Pharmacy options:24294}  Plan  {US Pharmacy FKCL:27517}    Follow up: *** month phone visit  ***

## 2019-10-31 ENCOUNTER — Ambulatory Visit (INDEPENDENT_AMBULATORY_CARE_PROVIDER_SITE_OTHER): Payer: Medicare Other

## 2019-10-31 ENCOUNTER — Ambulatory Visit: Payer: Medicare Other | Admitting: Podiatry

## 2019-10-31 ENCOUNTER — Other Ambulatory Visit: Payer: Self-pay

## 2019-10-31 DIAGNOSIS — M7731 Calcaneal spur, right foot: Secondary | ICD-10-CM

## 2019-10-31 DIAGNOSIS — M7750 Other enthesopathy of unspecified foot: Secondary | ICD-10-CM

## 2019-10-31 DIAGNOSIS — M7751 Other enthesopathy of right foot: Secondary | ICD-10-CM

## 2019-10-31 DIAGNOSIS — E1151 Type 2 diabetes mellitus with diabetic peripheral angiopathy without gangrene: Secondary | ICD-10-CM | POA: Diagnosis not present

## 2019-10-31 DIAGNOSIS — M79675 Pain in left toe(s): Secondary | ICD-10-CM

## 2019-10-31 DIAGNOSIS — M79674 Pain in right toe(s): Secondary | ICD-10-CM | POA: Diagnosis not present

## 2019-10-31 DIAGNOSIS — B351 Tinea unguium: Secondary | ICD-10-CM

## 2019-10-31 NOTE — Patient Instructions (Signed)

## 2019-11-04 ENCOUNTER — Telehealth: Payer: Self-pay | Admitting: Family

## 2019-11-04 NOTE — Progress Notes (Signed)
  Chronic Care Management   Note  11/04/2019 Name: VERITA KURODA MRN: [id number] DOB: 1950/06/13  DUDLEY COOLEY is a 69 y.o. year old female who is a primary care patient of Marrian Salvage, [place]. I reached out to Elberta Fortis by phone today in response to a referral sent by Ms. Ofilia Neas Graver's PCP, Marrian Salvage, FNP.   Ms. Wheelwright was given information about Chronic Care Management services today including:  1. CCM service includes personalized support from designated clinical staff supervised by her physician, including individualized plan of care and coordination with other care providers 2. 24/7 contact phone numbers for assistance for urgent and routine care needs. 3. Service will only be billed when office clinical staff spend 20 minutes or more in a month to coordinate care. 4. Only one practitioner may furnish and bill the service in a calendar month. 5. The patient may stop CCM services at any time (effective at the end of the month) by phone call to the office staff.   Patient agreed to services and verbal consent obtained.   Follow up plan:   Carley Perdue [facility] Scheduler

## 2019-11-05 NOTE — Progress Notes (Signed)
Subjective: 69 y.o. returns the office today for painful, elongated, thickened toenails which she cannot trim herself. Denies any redness or drainage around the nails.  She is also started have some discomfort of the posterior aspect of the right heel and she points on the Achilles tendon.  Denies any recent injury or trauma.  No swelling.  Pain is worse with prolonged walking.  No treatment.  Denies any ulcerations or changes since she was last seen.  Denies any systemic complaints such as fevers, chills, nausea, vomiting.   PCP: Marrian Salvage, FNP Last Seen: 09/24/2019  A1c: 7.3 on 09/09/2019  Objective: AAO 3, NAD DP/PT pulses decreased Nails hypertrophic, dystrophic, elongated, brittle, discolored 10. There is tenderness overlying the nails 1-5 bilaterally.  There is incurvation of the nail some area of skin growth around the nails.  There is no surrounding erythema or drainage along the nail sites. No open lesions or pre-ulcerative lesions are identified. Mild tenderness on the mid substance the Achilles tendon on the right side.  Thompson test is negative Achilles tendon appears to be intact.  There is no edema, erythema. No other areas of tenderness bilateral lower extremities. No overlying edema, erythema, increased warmth. No pain with calf compression, swelling, warmth, erythema.  Assessment: Patient presents with symptomatic onychomycosis; Achilles tendinitis  Plan: -Treatment options including alternatives, risks, complications were discussed -X-rays obtained reviewed.  There is no evidence of acute fracture.  Posterior calcaneal spurring is evident.  I recommended stretching daily.  I dispensed a gel offloading pad.  Heel lift was dispensed.  Discussed formal physical therapy she can start on her own at home if no improvement will refer to physical therapy. -Nails sharply debrided  10.  Upon review of the right fourth toenail, minimal bleeding occurred but there is no  cut of the skin this is from where the skin had grown over the nail.  I cleaned the area and applied a small amount of antibiotic ointment.  Discussed with monitor this very closely for signs or symptoms of infection. -Discussed daily foot inspection. If there are any changes, to call the office immediately.  -Follow-up in 3 months or sooner if any problems are to arise. In the meantime, encouraged to call the office with any questions, concerns, changes symptoms.  Celesta Gentile, DPM

## 2019-11-07 ENCOUNTER — Other Ambulatory Visit: Payer: Self-pay

## 2019-11-07 ENCOUNTER — Ambulatory Visit (HOSPITAL_COMMUNITY)
Admission: RE | Admit: 2019-11-07 | Discharge: 2019-11-07 | Disposition: A | Discharge status: Home / Self Care | Payer: Medicare Other | Source: Ambulatory Visit | Attending: Nephrology | Admitting: Nephrology

## 2019-11-07 DIAGNOSIS — N183 Chronic kidney disease, stage 3 unspecified: Secondary | ICD-10-CM | POA: Diagnosis not present

## 2019-11-07 DIAGNOSIS — D631 Anemia in chronic kidney disease: Secondary | ICD-10-CM | POA: Insufficient documentation

## 2019-11-07 LAB — RENAL FUNCTION PANEL
Albumin: 2.4 g/dL — ABNORMAL LOW (ref 3.5–5.0)
Anion gap: 9 (ref 5–15)
BUN: 43 mg/dL — ABNORMAL HIGH (ref 8–23)
CO2: 24 mmol/L (ref 22–32)
Calcium: 8.4 mg/dL — ABNORMAL LOW (ref 8.9–10.3)
Chloride: 104 mmol/L (ref 98–111)
Creatinine, Ser: 2.7 mg/dL — ABNORMAL HIGH (ref 0.44–1.00)
GFR, Estimated: 17 mL/min — ABNORMAL LOW (ref >60)
Glucose, Bld: 145 mg/dL — ABNORMAL HIGH (ref 70–99)
Phosphorus: 3.4 mg/dL (ref 2.5–4.6)
Potassium: 3.4 mmol/L — ABNORMAL LOW (ref 3.5–5.1)
Sodium: 137 mmol/L (ref 135–145)

## 2019-11-07 LAB — POCT HEMOGLOBIN-HEMACUE: Hemoglobin: 10.2 g/dL — ABNORMAL LOW (ref 12.0–15.0)

## 2019-11-07 MED ORDER — EPOETIN ALFA-EPBX 40000 UNIT/ML IJ SOLN
40000.0000 [IU] | INTRAMUSCULAR | Status: DC
  Administered 2019-11-07: 40000 [IU] via SUBCUTANEOUS

## 2019-11-07 MED ORDER — EPOETIN ALFA-EPBX 40000 UNIT/ML IJ SOLN
INTRAMUSCULAR | Status: AC
  Filled 2019-11-07: qty 1

## 2019-11-08 ENCOUNTER — Other Ambulatory Visit: Payer: Self-pay | Admitting: Physician Assistant

## 2019-11-08 LAB — PTH, INTACT AND CALCIUM
Calcium, Total (PTH): 8.3 mg/dL — ABNORMAL LOW (ref 8.7–10.3)
PTH: 111 pg/mL — ABNORMAL HIGH (ref 15–65)

## 2019-11-10 NOTE — H&P (Signed)
Chief Complaint: Patient was seen in consultation today for chronic kidney disease  Referring Physician(s): Sanford,Ryan B  Supervising Physician: Corrie Mckusick  Patient Status: [facility] - Out-pt  History of Present Illness: [name] is a 69 y.o. female with past medical history of diabetes and HTN with progressively worsening SCr, proteinuria.  She is referred to IR for random renal biopsy at the request of Dr. Joelyn Oms.   Patient presents to [facility] Radiology today in her usual state of health. She endorses a mild headache "comes and goes." Denies headache currently. She has run out of several of her blood pressure medicines and was only able to take her metoprolol and clonidine this AM.  She has been NPO.  She does not take blood thinners.   Past Medical History:  Diagnosis Date  . Acute kidney injury ([place]) 01/08/2015  . Arthritis   . Chronic back pain   . Constipation   . Cough   . Diabetes mellitus, type 2 ([place])   . Diverticulosis   . Fibroid    patient thinks this was the reason for her hysterectomy  . GERD (gastroesophageal reflux disease)   . Heart murmur   . History of sebaceous cyst   . Hyperlipemia   . Hyperplastic colon polyp   . Hypertension   . Hyperthyroidism   . Lumbar radiculopathy   . Shortness of breath 09/13/2013  . Stroke ([place]) 2015?   x4   . Tobacco abuse   . 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 ABDOMINAL HYSTERECTOMY     --?ovaries remain  . COLONOSCOPY W/ BIOPSIES  09/11/2008  . RETINOPATHY SURGERY Bilateral 2013   Dr. Zigmund Daniel    Allergies: Patient has no known allergies.  Medications: Prior to Admission medications   Medication Sig Start Date End Date Taking? Authorizing Provider  aspirin 325 MG tablet Take 325 mg by mouth daily.   Yes [provider]  atorvastatin (LIPITOR) 80 MG tablet TAKE 1 TABLET BY MOUTH EVERY DAY Patient  taking differently: Take 80 mg by mouth daily.  06/12/19  Yes Marrian Salvage, FNP  bromocriptine (PARLODEL) 2.5 MG tablet Take 1 tablet (2.5 mg total) by mouth daily. 09/09/19  Yes Renato Shin, MD  cloNIDine (CATAPRES) 0.2 MG tablet Take 1 tablet (0.2 mg total) by mouth 3 (three) times daily. 05/21/19  Yes Marrian Salvage, FNP  furosemide (LASIX) 40 MG tablet Take 1 tablet (40 mg total) by mouth See admin instructions. Take 1 tablet daily, can take another pill if ankles are swelling -Hold for the next 3 days and resume on 09/13/2017 Patient taking differently: Take 40 mg by mouth daily as needed for edema.  09/09/17  Yes Elgergawy, Silver Huguenin, MD  metoprolol succinate (TOPROL-XL) 100 MG 24 hr tablet Take 1 tablet (100 mg total) by mouth daily. for high blood pressure 08/20/19  Yes Marrian Salvage, FNP  pantoprazole (PROTONIX) 40 MG tablet Take 1 tablet (40 mg total) by mouth daily. 06/12/19  Yes Marrian Salvage, FNP  repaglinide (PRANDIN) 2 MG tablet TAKE 1 TABLET (2 MG TOTAL) BY MOUTH 3 (THREE) TIMES DAILY BEFORE MEALS. 02/22/19  Yes Renato Shin, MD  telmisartan-hydrochlorothiazide (MICARDIS HCT) 80-25 MG tablet TAKE 1 TABLET BY MOUTH EVERY DAY Patient taking differently: Take 1 tablet by mouth daily.  10/03/19  Yes Marrian Salvage, FNP  amLODipine (NORVASC) 10 MG tablet Take 1  tablet (10 mg total) by mouth daily. 03/26/15   [place], FNP  Blood Glucose Monitoring Suppl ([place]) w/Device KIT 1 each by Does not apply route 2 (two) times daily. E11.9    [provider]  calcitRIOL (ROCALTROL) 0.25 MCG capsule Take 0.25 mcg by mouth daily.    [provider]  glucose blood (ONETOUCH VERIO) test strip USE AS INSTRUCTED (TESTS 2 TIMES A DAY) 05/20/19   Marrian Salvage, FNP  OneTouch Delica Lancets 22U MISC 1 each by Does not apply route 2 (two) times daily. E11.9    [provider]  sulfamethoxazole-trimethoprim  (BACTRIM DS) 800-160 MG tablet Take 1 tablet by mouth 2 (two) times daily. Patient not taking: Reported on 11/05/2019 09/24/19   Marrian Salvage, FNP  vitamin B-12 (CYANOCOBALAMIN) 100 MCG tablet Take 1 tablet (100 mcg total) by mouth at bedtime. Patient not taking: Reported on 11/05/2019 08/20/19   Marrian Salvage, FNP     Family History  Problem Relation Age of Onset  . Hypertension Mother   . Sleep apnea Mother   . Diabetes Mother   . Hyperlipidemia Mother   . Lung cancer Father        smoker  . Hypertension Sister   . Diabetes Sister   . Hypertension Brother   . Hypertension Sister   . Diabetes Brother   . Hypertension Brother   . Breast cancer Neg Hx   . Colon cancer Neg Hx   . Esophageal cancer Neg Hx   . Pancreatic cancer Neg Hx   . Liver disease Neg Hx   . Colon polyps Neg Hx     Social History   Socioeconomic History  . Marital status: Divorced    Spouse name: Not on file  . Number of children: 1  . Years of education: 47  . Highest education level: Not on file  Occupational History  . 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 Smoking status: Former Smoker    Packs/day: 0.50    Years: 35.00    Pack years: 17.50    Types: Cigarettes    Quit date: 07/07/2014    Years since quitting: 5.3  . Smokeless tobacco: Never Used  Vaping Use  . Vaping Use: Never used  Substance and Sexual Activity  . Alcohol use: No  . Drug use: No  . Sexual activity: Never    Partners: Male    Birth control/protection: Surgical    Comment: TAH--?ovaries remain  Other Topics Concern  . Not on file  Social History Narrative   Divorced 1 son    Retired Museum/gallery curator for [employer]   Former smoker no alcohol caffeine or drug use report denies abuse and feels safe at home.   Social Determinants of Health   Financial Resource Strain: Medium Risk  . Difficulty of Paying Living Expenses: Somewhat hard    Food Insecurity: Food Insecurity Present  . Worried About Charity fundraiser in the Last Year: Often true  . Ran Out of Food in the Last Year: Sometimes true  Transportation Needs: No Transportation Needs  . Lack of Transportation (Medical): No  . Lack of Transportation (Non-Medical): No  Physical Activity: Sufficiently Active  . Days of Exercise per Week: 5 days  . Minutes of Exercise per Session: 30 min  Stress: No Stress Concern Present  . Feeling of Stress : Not at all  Social Connections:   . Frequency of Communication with Friends and Family: Not on file  . Frequency of Social Gatherings with Friends and Family: Not on file  . Attends Religious Services: Not on file  . Active Member of Clubs or Organizations: Not on file  . Attends Archivist Meetings: Not on file  . Marital Status: Not on file     Review of Systems: A 12 point ROS discussed and pertinent positives are indicated in the HPI above.  All other systems are negative.  Review of Systems  Constitutional: Negative for fatigue and fever.  Respiratory: Negative for cough and shortness of breath.   Cardiovascular: Negative for chest pain.  Gastrointestinal: Negative for abdominal pain, nausea and vomiting.  Genitourinary: Negative for dysuria.  Musculoskeletal: Negative for back pain.  Neurological: Positive for headaches (mild, occasional).  Psychiatric/Behavioral: Negative for behavioral problems and confusion.    Vital Signs: BP (!) 234/86   Pulse (!) 58   Temp 98 F (36.7 C) (Oral)   Resp 16   Ht _0  (1.575 m)   Wt 137 lb (62.1 kg)   SpO2 100%   BMI 25.06 kg/m   Physical Exam Vitals and nursing note reviewed.  Constitutional:      General: She is not in acute distress.    Appearance: Normal appearance. She is not ill-appearing.  HENT:     Mouth/Throat:     Mouth: Mucous membranes are moist.     Pharynx: Oropharynx is clear.  Cardiovascular:     Rate and Rhythm: Normal rate and  regular rhythm.  Pulmonary:     Effort: Pulmonary effort is normal.     Breath sounds: Normal breath sounds.  Abdominal:     General: Abdomen is flat.     Palpations: Abdomen is soft.  Skin:    General: Skin is warm and dry.  Neurological:     Mental Status: She is alert.  Psychiatric:        Mood and Affect: Mood normal.        Behavior: Behavior normal.        Thought Content: Thought content normal.        Judgment: Judgment normal.      MD Evaluation Airway: WNL Heart: WNL Abdomen: WNL Chest/ Lungs: WNL ASA  Classification: 3 Mallampati/Airway Score: One   Imaging: DG Ankle Complete Right  Result Date: 10/31/2019 Please see detailed radiograph report in office note.  DG Foot 2 Views Right  Result Date: 10/31/2019 Please see detailed radiograph report in office note.   Labs:  CBC: Recent Labs    09/24/19 1044 10/10/19 0918 11/07/19 0857 11/11/19 0620  WBC 7.3  --   --  5.8  HGB 13.3 10.4* 10.2* 10.3*  HCT 40.5  --   --  32.7*  PLT 298  --   --  285    COAGS: Recent Labs    11/11/19 0620  INR 1.0    BMP: Recent Labs    09/24/19 1044 11/07/19 1011  NA 140 137  K 3.5 3.4*  CL 100 104  CO2 28 24  GLUCOSE 64* 145*  BUN 46* 43*  CALCIUM 9.3 8.4*  8.3*  CREATININE 2.09* 2.70*  GFRNONAA  --  17*    LIVER FUNCTION TESTS: Recent Labs    09/24/19 1044 11/07/19 1011  BILITOT 1.1  --   AST 22  --   ALT 19  --   PROT 7.7  --  ALBUMIN  --  2.4*    TUMOR MARKERS: No results for input(s): AFPTM, CEA, CA199, CHROMGRNA in the last 8760 hours.  Assessment and Plan: Patient with past medical history of DM, HTN, HLD presents with complaint of progressive renal failure, proteinuria.  IR consulted for random renal biopsy at the request of Dr. Joelyn Oms. Case reviewed by Dr. Earleen Newport who approves patient for procedure.  Patient presents today in their usual state of health. States she has had a headache that "comes and goes."  No headache  present at the time of my assessment.  Denies pain.   She has been NPO and is not currently on blood thinners.  Unfortunately, she is out of several of her medicines and her blood pressure this AM is 235/86.   Will work toward BP control this AM prior to either discharge or proceeding with biopsy.  Goal SBP <160  Risks and benefits of biopsy was discussed with the patient and/or patient's family including, but not limited to bleeding, infection, damage to adjacent structures or low yield requiring additional tests.  All of the questions were answered and there is agreement to proceed.  Consent signed and in chart.  Thank you for this interesting consult.  I greatly enjoyed meeting [name] and look forward to participating in their care.  A copy of this report was sent to the requesting provider on this date.  Electronically Signed: Docia Barrier, PA 11/11/2019, 8:04 AM   I spent a total of  30 Minutes   in face to face in clinical consultation, greater than 50% of which was counseling/coordinating care for chronic renal disease stage IV.

## 2019-11-11 ENCOUNTER — Other Ambulatory Visit: Payer: Self-pay

## 2019-11-11 ENCOUNTER — Encounter (HOSPITAL_COMMUNITY): Payer: Self-pay

## 2019-11-11 ENCOUNTER — Ambulatory Visit (HOSPITAL_COMMUNITY)
Admission: RE | Admit: 2019-11-11 | Discharge: 2019-11-11 | Disposition: A | Discharge status: Home / Self Care | Payer: Medicare Other | Source: Ambulatory Visit | Attending: Nephrology | Admitting: Nephrology

## 2019-11-11 DIAGNOSIS — I129 Hypertensive chronic kidney disease with stage 1 through stage 4 chronic kidney disease, or unspecified chronic kidney disease: Secondary | ICD-10-CM | POA: Diagnosis not present

## 2019-11-11 DIAGNOSIS — N184 Chronic kidney disease, stage 4 (severe): Secondary | ICD-10-CM | POA: Diagnosis not present

## 2019-11-11 DIAGNOSIS — E1122 Type 2 diabetes mellitus with diabetic chronic kidney disease: Secondary | ICD-10-CM | POA: Diagnosis not present

## 2019-11-11 DIAGNOSIS — R809 Proteinuria, unspecified: Secondary | ICD-10-CM

## 2019-11-11 LAB — CBC
HCT: 32.7 % — ABNORMAL LOW (ref 36.0–46.0)
Hemoglobin: 10.3 g/dL — ABNORMAL LOW (ref 12.0–15.0)
MCH: 30 pg (ref 26.0–34.0)
MCHC: 31.5 g/dL (ref 30.0–36.0)
MCV: 95.3 fL (ref 80.0–100.0)
Platelets: 285 10*3/uL (ref 150–400)
RBC: 3.43 MIL/uL — ABNORMAL LOW (ref 3.87–5.11)
RDW: 14.1 % (ref 11.5–15.5)
WBC: 5.8 10*3/uL (ref 4.0–10.5)
nRBC: 0.5 % — ABNORMAL HIGH (ref 0.0–0.2)

## 2019-11-11 LAB — PROTIME-INR
INR: 1 (ref 0.8–1.2)
Prothrombin Time: 12.9 seconds (ref 11.4–15.2)

## 2019-11-11 LAB — GLUCOSE, CAPILLARY: Glucose-Capillary: 215 mg/dL — ABNORMAL HIGH (ref 70–99)

## 2019-11-11 MED ORDER — HYDRALAZINE HCL 10 MG PO TABS
10.0000 mg | ORAL_TABLET | Freq: Once | ORAL | Status: AC
  Administered 2019-11-11: 10 mg via ORAL
  Filled 2019-11-11: qty 1

## 2019-11-11 MED ORDER — AMLODIPINE BESYLATE 10 MG PO TABS
10.0000 mg | ORAL_TABLET | Freq: Once | ORAL | Status: AC
  Administered 2019-11-11: 10 mg via ORAL
  Filled 2019-11-11 (×2): qty 1

## 2019-11-11 MED ORDER — SODIUM CHLORIDE 0.9 % IV SOLN: INTRAVENOUS | Status: DC

## 2019-11-11 MED ORDER — CLONIDINE HCL 0.2 MG PO TABS
0.2000 mg | ORAL_TABLET | Freq: Once | ORAL | Status: DC
  Filled 2019-11-11 (×2): qty 1

## 2019-11-11 MED ORDER — AMLODIPINE BESYLATE 10 MG PO TABS: 10.0000 mg | ORAL_TABLET | Freq: Once | ORAL | Status: DC

## 2019-11-11 NOTE — Progress Notes (Signed)
Interventional Radiology Brief Note:  Patient presents with poorly controlled BP.  Her BP on initial assessment is 235/85.  States her blood pressure when checked at home is systolic 030-092.  Admits to being out of her home meds and has not taken in several days.  Did take clonidine and metoprolol this AM.  Given home med of Norvasc with correction to 201/72.  Additional dose of hydralazine and BP down to 186/72.  She is asymptomatic and otherwise stable. Encouraged patient to call her pharmacy, and nephrologist/PCP office if needed, to obtain refills of her home medications.  [place] for discharge home with sister.   Brynda Greathouse, MS RD PA-C

## 2019-11-11 NOTE — Progress Notes (Signed)
IV removed and patient escorted to main lobby for discharge home with sister. Patient denies headache/dizziness and states that she feels fine.

## 2019-11-14 ENCOUNTER — Other Ambulatory Visit: Payer: Self-pay | Admitting: Endocrinology

## 2019-11-14 DIAGNOSIS — N183 Chronic kidney disease, stage 3 unspecified: Secondary | ICD-10-CM

## 2019-11-14 DIAGNOSIS — E1122 Type 2 diabetes mellitus with diabetic chronic kidney disease: Secondary | ICD-10-CM

## 2019-11-22 ENCOUNTER — Other Ambulatory Visit: Payer: Self-pay | Admitting: Student

## 2019-11-22 NOTE — H&P (Signed)
Chief Complaint: Patient was seen in consultation today for random renal biopsy.   Referring Physician(s): Sanford,Ryan B  Supervising Physician: Sandi Mariscal  Patient Status: [facility] - Out-pt  History of Present Illness: [name] is a 69 y.o. female with a medical history significant for DM2, HTN, stroke x 4, and chronic kidney disease stage IV. She has had progressively worsening worsening creatinine levels and proteinuria.   Interventional Radiology has been asked to evaluate this patient for a random renal biopsy for further work up and evaluation. Of note, the patient initially presented to IR for the same procedure 11/11/19 but this had to be postponed for elevated blood pressures insufficiently responsive to IV anti-hypertensive medication.  Past Medical History:  Diagnosis Date  . Acute kidney injury ([place]) 01/08/2015  . Arthritis   . Chronic back pain   . Constipation   . Cough   . Diabetes mellitus, type 2 ([place])   . Diverticulosis   . Fibroid    patient thinks this was the reason for her hysterectomy  . GERD (gastroesophageal reflux disease)   . Heart murmur   . History of sebaceous cyst   . Hyperlipemia   . Hyperplastic colon polyp   . Hypertension   . Hyperthyroidism   . Lumbar radiculopathy   . Shortness of breath 09/13/2013  . Stroke ([place]) 2015?   x4   . Tobacco abuse   . 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 ABDOMINAL HYSTERECTOMY     --?ovaries remain  . COLONOSCOPY W/ BIOPSIES  09/11/2008  . RETINOPATHY SURGERY Bilateral 2013   Dr. Zigmund Daniel    Allergies: Patient has no known allergies.  Medications: Prior to Admission medications   Medication Sig Start Date End Date Taking? Authorizing Provider  aspirin 325 MG tablet Take 325 mg by mouth daily.   Yes [provider]  atorvastatin (LIPITOR) 80 MG tablet TAKE 1 TABLET BY MOUTH EVERY  DAY Patient taking differently: Take 80 mg by mouth daily.  06/12/19  Yes Marrian Salvage, FNP  bromocriptine (PARLODEL) 2.5 MG tablet TAKE 1/2 TABLET BY MOUTH EVERY DAY Patient taking differently: Take 2.5 mg by mouth daily.  11/14/19  Yes Renato Shin, MD  calcitRIOL (ROCALTROL) 0.25 MCG capsule Take 0.25 mcg by mouth daily.   Yes [provider]  cloNIDine (CATAPRES) 0.2 MG tablet Take 1 tablet (0.2 mg total) by mouth 3 (three) times daily. 05/21/19  Yes Marrian Salvage, FNP  furosemide (LASIX) 40 MG tablet Take 1 tablet (40 mg total) by mouth See admin instructions. Take 1 tablet daily, can take another pill if ankles are swelling -Hold for the next 3 days and resume on 09/13/2017 Patient taking differently: Take 40 mg by mouth daily as needed for edema.  09/09/17  Yes Elgergawy, Silver Huguenin, MD  metoprolol succinate (TOPROL-XL) 100 MG 24 hr tablet Take 1 tablet (100 mg total) by mouth daily. for high blood pressure 08/20/19  Yes Marrian Salvage, FNP  minoxidil (LONITEN) 2.5 MG tablet Take 2.5 mg by mouth 3 (three) times daily.   Yes [provider]  pantoprazole (PROTONIX) 40 MG tablet Take 1 tablet (40 mg total) by mouth daily. 06/12/19  Yes Marrian Salvage, FNP  polyethylene glycol (MIRALAX / GLYCOLAX) 17 g packet Take 34-51 g by mouth daily as needed for moderate constipation.   Yes [provider]  repaglinide (PRANDIN)  2 MG tablet TAKE 1 TABLET (2 MG TOTAL) BY MOUTH 3 (THREE) TIMES DAILY BEFORE MEALS. 02/22/19  Yes Romero Belling, MD  telmisartan-hydrochlorothiazide (MICARDIS HCT) 80-25 MG tablet TAKE 1 TABLET BY MOUTH EVERY DAY Patient taking differently: Take 1 tablet by mouth daily.  10/03/19  Yes Olive Bass, FNP  amLODipine (NORVASC) 10 MG tablet Take 1 tablet (10 mg total) by mouth daily. Patient not taking: Reported on 11/20/2019 03/26/15   Veryl Speak, FNP  Blood Glucose Monitoring Suppl (ONETOUCH VERIO FLEX SYSTEM)  w/Device KIT 1 each by Does not apply route 2 (two) times daily. E11.9    [provider]  glucose blood (ONETOUCH VERIO) test strip USE AS INSTRUCTED (TESTS 2 TIMES A DAY) 05/20/19   Olive Bass, FNP  OneTouch Delica Lancets 33G MISC 1 each by Does not apply route 2 (two) times daily. E11.9    [provider]  vitamin B-12 (CYANOCOBALAMIN) 100 MCG tablet Take 1 tablet (100 mcg total) by mouth at bedtime. Patient not taking: Reported on 11/05/2019 08/20/19   Olive Bass, FNP     Family History  Problem Relation Age of Onset  . Hypertension Mother   . Sleep apnea Mother   . Diabetes Mother   . Hyperlipidemia Mother   . Lung cancer Father        smoker  . Hypertension Sister   . Diabetes Sister   . Hypertension Brother   . Hypertension Sister   . Diabetes Brother   . Hypertension Brother   . Breast cancer Neg Hx   . Colon cancer Neg Hx   . Esophageal cancer Neg Hx   . Pancreatic cancer Neg Hx   . Liver disease Neg Hx   . Colon polyps Neg Hx     Social History   Socioeconomic History  . Marital status: Divorced    Spouse name: Not on file  . Number of children: 1  . Years of education: 79  . Highest education level: Not on file  Occupational History  . Occupation: Engineer, drilling: [employer]    Comment: Retired  Tobacco Use  . Smoking status: Former Smoker    Packs/day: 0.50    Years: 35.00    Pack years: 17.50    Types: Cigarettes    Quit date: 07/07/2014    Years since quitting: 5.3  . Smokeless tobacco: Never Used  Vaping Use  . Vaping Use: Never used  Substance and Sexual Activity  . Alcohol use: No  . Drug use: No  . Sexual activity: Never    Partners: Male    Birth control/protection: Surgical    Comment: TAH--?ovaries remain  Other Topics Concern  . Not on file  Social History Narrative   Divorced 1 son    Retired Administrator, sports for [employer]   Former smoker no alcohol caffeine or  drug use report denies abuse and feels safe at home.   Social Determinants of Health   Financial Resource Strain: Medium Risk  . Difficulty of Paying Living Expenses: Somewhat hard  Food Insecurity: Food Insecurity Present  . Worried About Programme researcher, broadcasting/film/video in the Last Year: Often true  . Ran Out of Food in the Last Year: Sometimes true  Transportation Needs: No Transportation Needs  . Lack of Transportation (Medical): No  . Lack of Transportation (Non-Medical): No  Physical Activity: Sufficiently Active  . Days of Exercise per Week: 5 days  . Minutes of  Exercise per Session: 30 min  Stress: No Stress Concern Present  . Feeling of Stress : Not at all  Social Connections:   . Frequency of Communication with Friends and Family: Not on file  . Frequency of Social Gatherings with Friends and Family: Not on file  . Attends Religious Services: Not on file  . Active Member of Clubs or Organizations: Not on file  . Attends Archivist Meetings: Not on file  . Marital Status: Not on file    Review of Systems: A 12 point ROS discussed and pertinent positives are indicated in the HPI above.  All other systems are negative.  Review of Systems  Constitutional: Negative for appetite change and fatigue.  Respiratory: Negative for cough and shortness of breath.   Cardiovascular: Negative for chest pain and leg swelling.  Gastrointestinal: Negative for abdominal pain, diarrhea, nausea and vomiting.  Musculoskeletal: Negative for back pain.  Neurological: Negative for dizziness and headaches.    Vital Signs: BP (!) 189/77   Pulse (!) 57   Temp 97.8 F (36.6 C) (Oral)   Resp 16   Ht $R'5\' 2"'SJ$  (1.575 m)   Wt 138 lb (62.6 kg)   SpO2 99%   BMI 25.24 kg/m   Physical Exam Constitutional:      General: She is not in acute distress. HENT:     Mouth/Throat:     Mouth: Mucous membranes are moist.     Pharynx: Oropharynx is clear.  Cardiovascular:     Rate and Rhythm: Normal rate  and regular rhythm.     Pulses: Normal pulses.     Heart sounds: Normal heart sounds.  Pulmonary:     Effort: Pulmonary effort is normal.     Breath sounds: Normal breath sounds.  Abdominal:     General: Bowel sounds are normal.     Palpations: Abdomen is soft.  Musculoskeletal:        General: Normal range of motion.  Skin:    General: Skin is warm and dry.  Neurological:     Mental Status: She is alert and oriented to person, place, and time.     Imaging: DG Ankle Complete Right  Result Date: 10/31/2019 Please see detailed radiograph report in office note.  DG Foot 2 Views Right  Result Date: 10/31/2019 Please see detailed radiograph report in office note.   Labs:  CBC: Recent Labs    09/24/19 1044 09/24/19 1044 10/10/19 0918 11/07/19 0857 11/11/19 0620 11/25/19 0556  WBC 7.3  --   --   --  5.8 5.3  HGB 13.3   < > 10.4* 10.2* 10.3* 9.7*  HCT 40.5  --   --   --  32.7* 29.7*  PLT 298  --   --   --  285 226   < > = values in this interval not displayed.    COAGS: Recent Labs    11/11/19 0620 11/25/19 0556  INR 1.0 1.0    BMP: Recent Labs    09/24/19 1044 11/07/19 1011  NA 140 137  K 3.5 3.4*  CL 100 104  CO2 28 24  GLUCOSE 64* 145*  BUN 46* 43*  CALCIUM 9.3 8.4*  8.3*  CREATININE 2.09* 2.70*  GFRNONAA  --  17*    LIVER FUNCTION TESTS: Recent Labs    09/24/19 1044 11/07/19 1011  BILITOT 1.1  --   AST 22  --   ALT 19  --   PROT 7.7  --  ALBUMIN  --  2.4*    TUMOR MARKERS: No results for input(s): AFPTM, CEA, CA199, CHROMGRNA in the last 8760 hours.  Assessment and Plan:  Progressive kidney disease; proteinuria: [name], 69 year old female, presents today to the [facility] department for an image-guided random renal biopsy.   Risks and benefits of this procedure were discussed with the patient including, but not limited to bleeding, infection, damage to adjacent structures or low yield requiring  additional tests.  All of the questions were answered and there is agreement to proceed.  The patient has been NPO. Labs and vitals have been reviewed.  Consent signed and in chart.  Thank you for this interesting consult.  I greatly enjoyed meeting [name] and look forward to participating in their care.  A copy of this report was sent to the requesting provider on this date.  Electronically Signed: Soyla Dryer, AGACNP-BC [phone] 11/25/2019, 7:23 AM   I spent a total of  30 Minutes   in face to face in clinical consultation, greater than 50% of which was counseling/coordinating care for random renal biopsy

## 2019-11-25 ENCOUNTER — Other Ambulatory Visit: Payer: Self-pay

## 2019-11-25 ENCOUNTER — Ambulatory Visit (HOSPITAL_COMMUNITY)
Admission: RE | Admit: 2019-11-25 | Discharge: 2019-11-25 | Disposition: A | Discharge status: Home / Self Care | Payer: Medicare Other | Source: Ambulatory Visit | Attending: Nephrology | Admitting: Nephrology

## 2019-11-25 ENCOUNTER — Encounter (HOSPITAL_COMMUNITY): Payer: Self-pay

## 2019-11-25 DIAGNOSIS — N184 Chronic kidney disease, stage 4 (severe): Secondary | ICD-10-CM | POA: Insufficient documentation

## 2019-11-25 DIAGNOSIS — I129 Hypertensive chronic kidney disease with stage 1 through stage 4 chronic kidney disease, or unspecified chronic kidney disease: Secondary | ICD-10-CM | POA: Insufficient documentation

## 2019-11-25 DIAGNOSIS — E1122 Type 2 diabetes mellitus with diabetic chronic kidney disease: Secondary | ICD-10-CM | POA: Diagnosis not present

## 2019-11-25 DIAGNOSIS — Z87891 Personal history of nicotine dependence: Secondary | ICD-10-CM | POA: Insufficient documentation

## 2019-11-25 DIAGNOSIS — Z7984 Long term (current) use of oral hypoglycemic drugs: Secondary | ICD-10-CM | POA: Insufficient documentation

## 2019-11-25 DIAGNOSIS — R944 Abnormal results of kidney function studies: Secondary | ICD-10-CM | POA: Insufficient documentation

## 2019-11-25 DIAGNOSIS — Z7982 Long term (current) use of aspirin: Secondary | ICD-10-CM | POA: Insufficient documentation

## 2019-11-25 DIAGNOSIS — Z79899 Other long term (current) drug therapy: Secondary | ICD-10-CM | POA: Insufficient documentation

## 2019-11-25 DIAGNOSIS — R809 Proteinuria, unspecified: Secondary | ICD-10-CM | POA: Insufficient documentation

## 2019-11-25 DIAGNOSIS — Z8673 Personal history of transient ischemic attack (TIA), and cerebral infarction without residual deficits: Secondary | ICD-10-CM | POA: Insufficient documentation

## 2019-11-25 LAB — PROTIME-INR
INR: 1 (ref 0.8–1.2)
Prothrombin Time: 13.2 seconds (ref 11.4–15.2)

## 2019-11-25 LAB — CBC
HCT: 29.7 % — ABNORMAL LOW (ref 36.0–46.0)
Hemoglobin: 9.7 g/dL — ABNORMAL LOW (ref 12.0–15.0)
MCH: 31.7 pg (ref 26.0–34.0)
MCHC: 32.7 g/dL (ref 30.0–36.0)
MCV: 97.1 fL (ref 80.0–100.0)
Platelets: 226 10*3/uL (ref 150–400)
RBC: 3.06 MIL/uL — ABNORMAL LOW (ref 3.87–5.11)
RDW: 14.7 % (ref 11.5–15.5)
WBC: 5.3 10*3/uL (ref 4.0–10.5)
nRBC: 0 % (ref 0.0–0.2)

## 2019-11-25 LAB — GLUCOSE, CAPILLARY: Glucose-Capillary: 191 mg/dL — ABNORMAL HIGH (ref 70–99)

## 2019-11-25 MED ORDER — LIDOCAINE-EPINEPHRINE 1 %-1:100000 IJ SOLN
INTRAMUSCULAR | Status: AC
  Filled 2019-11-25: qty 1

## 2019-11-25 MED ORDER — FENTANYL CITRATE (PF) 100 MCG/2ML IJ SOLN
INTRAMUSCULAR | Status: AC
  Filled 2019-11-25: qty 2

## 2019-11-25 MED ORDER — GELATIN ABSORBABLE 12-7 MM EX MISC
CUTANEOUS | Status: AC
  Filled 2019-11-25: qty 1

## 2019-11-25 MED ORDER — MIDAZOLAM HCL 2 MG/2ML IJ SOLN
INTRAMUSCULAR | Status: AC | PRN
  Administered 2019-11-25 (×2): 1 mg via INTRAVENOUS

## 2019-11-25 MED ORDER — MIDAZOLAM HCL 2 MG/2ML IJ SOLN
INTRAMUSCULAR | Status: AC
  Filled 2019-11-25: qty 2

## 2019-11-25 MED ORDER — FENTANYL CITRATE (PF) 100 MCG/2ML IJ SOLN
INTRAMUSCULAR | Status: AC | PRN
Start: 2019-11-25 — End: 2019-11-25
  Administered 2019-11-25 (×2): 50 ug via INTRAVENOUS

## 2019-11-25 MED ORDER — SODIUM CHLORIDE 0.9 % IV SOLN: INTRAVENOUS | Status: DC

## 2019-11-25 NOTE — Procedures (Signed)
Pre Procedure Dx: Proteinuria Post Procedural Dx: Same  Technically successful US guided biopsy of inferior pole of the right kidney.  EBL: None  No immediate complications.   Jay Finesse Fielder, MD Pager #: [phone]    

## 2019-11-25 NOTE — Sedation Documentation (Addendum)
Attempt made x 2 to give report to Short Stay unit. Currently, no RN's available at this time to take report.

## 2019-11-25 NOTE — Discharge Instructions (Addendum)
Percutaneous Kidney Biopsy, Care After This sheet gives you information about how to care for yourself after your procedure. Your health care provider may also give you more specific instructions. If you have problems or questions, contact your health care provider. What can I expect after the procedure? After the procedure, it is common to have:  Pain or soreness near the biopsy site.  Pink or cloudy urine for 24 hours after the procedure. Follow these instructions at home: Activity  Return to your normal activities as told by your health care provider. Ask your health care provider what activities are safe for you.  If you were given a sedative during the procedure, it can affect you for several hours. Do not drive or operate machinery until your health care provider says that it is safe.  Do not lift anything that is heavier than 10 lb (4.5 kg), or the limit that you are told, until your health care provider says that it is safe.  Avoid activities that take a lot of effort (are strenuous) until your health care provider approves. Most people will have to wait 2 weeks before returning to activities such as exercise or sex. General instructions   Take over-the-counter and prescription medicines only as told by your health care provider.  You may eat and drink after your procedure. Follow instructions from your health care provider about eating or drinking restrictions.  Check your biopsy site every day for signs of infection. Check for: ? More redness, swelling, or pain. ? Fluid or blood. ? Warmth. ? Pus or a bad smell.  Keep all follow-up visits as told by your health care provider. This is important. Contact a health care provider if:  You have more redness, swelling, or pain around your biopsy site.  You have fluid or blood coming from your biopsy site.  Your biopsy site feels warm to the touch.  You have pus or a bad smell coming from your biopsy site.  You have blood  in your urine more than 24 hours after your procedure.  You have a fever. Get help right away if:  Your urine is dark red or brown.  You cannot urinate.  It burns when you urinate.  You feel dizzy or light-headed.  You have severe pain in your abdomen or side. Summary  After the procedure, it is common to have pain or soreness at the biopsy site and pink or cloudy urine for the first 24 hours.  Check your biopsy site each day for signs of infection, such as more redness, swelling, or pain; fluid, blood, pus or a bad smell coming from the biopsy site; or the biopsy site feeling warm to touch.  Return to your normal activities as told by your health care provider. This information is not intended to replace advice given to you by your health care provider. Make sure you discuss any questions you have with your health care provider. Document Revised: 09/13/2018 Document Reviewed: 09/13/2018 Elsevier Patient Education  2020 [employer] Moderate Conscious Sedation, Adult Sedation is the use of medicines to promote relaxation and relieve discomfort and anxiety. Moderate conscious sedation is a type of sedation. Under moderate conscious sedation, you are less alert than normal, but you are still able to respond to instructions, touch, or both. Moderate conscious sedation is used during short medical and dental procedures. It is milder than deep sedation, which is a type of sedation under which you cannot be easily woken up. It is also milder than   general anesthesia, which is the use of medicines to make you unconscious. Moderate conscious sedation allows you to return to your regular activities sooner. Tell a health care provider about:  Any allergies you have.  All medicines you are taking, including vitamins, herbs, eye drops, creams, and over-the-counter medicines.  Use of steroids (by mouth or creams).  Any problems you or family members have had with sedatives and anesthetic  medicines.  Any blood disorders you have.  Any surgeries you have had.  Any medical conditions you have, such as sleep apnea.  Whether you are pregnant or may be pregnant.  Any use of cigarettes, alcohol, marijuana, or street drugs. What are the risks? Generally, this is a safe procedure. However, problems may occur, including:  Getting too much medicine (oversedation).  Nausea.  Allergic reaction to medicines.  Trouble breathing. If this happens, a breathing tube may be used to help with breathing. It will be removed when you are awake and breathing on your own.  Heart trouble.  Lung trouble. What happens before the procedure? Staying hydrated Follow instructions from your health care provider about hydration, which may include:  Up to 2 hours before the procedure - you may continue to drink clear liquids, such as water, clear fruit juice, black coffee, and plain tea. Eating and drinking restrictions Follow instructions from your health care provider about eating and drinking, which may include:  8 hours before the procedure - stop eating heavy meals or foods such as meat, fried foods, or fatty foods.  6 hours before the procedure - stop eating light meals or foods, such as toast or cereal.  6 hours before the procedure - stop drinking milk or drinks that contain milk.  2 hours before the procedure - stop drinking clear liquids. Medicine Ask your health care provider about:  Changing or stopping your regular medicines. This is especially important if you are taking diabetes medicines or blood thinners.  Taking medicines such as aspirin and ibuprofen. These medicines can thin your blood. Do not take these medicines before your procedure if your health care provider instructs you not to.  Tests and exams  You will have a physical exam.  You may have blood tests done to show: ? How well your kidneys and liver are working. ? How well your blood can clot. General  instructions  Plan to have someone take you home from the hospital or clinic.  If you will be going home right after the procedure, plan to have someone with you for 24 hours. What happens during the procedure?  An IV tube will be inserted into one of your veins.  Medicine to help you relax (sedative) will be given through the IV tube.  The medical or dental procedure will be performed. What happens after the procedure?  Your blood pressure, heart rate, breathing rate, and blood oxygen level will be monitored often until the medicines you were given have worn off.  Do not drive for 24 hours. This information is not intended to replace advice given to you by your health care provider. Make sure you discuss any questions you have with your health care provider. Document Revised: 12/23/2016 Document Reviewed: 05/02/2015 Elsevier Patient Education  2020 [employer]  

## 2019-11-25 NOTE — Sedation Documentation (Signed)
Attempt x 1 made to call Short Stay for report. Instructed by Short Stay RN that call will be returned. Will call back in 5 minutes.

## 2019-11-27 ENCOUNTER — Inpatient Hospital Stay (HOSPITAL_COMMUNITY)
Admission: EM | Admit: 2019-11-27 | Discharge: 2019-12-03 | DRG: 699 | Disposition: A | Discharge status: Skilled Nursing Facility | Payer: Medicare Other | Attending: Internal Medicine | Admitting: Internal Medicine

## 2019-11-27 ENCOUNTER — Emergency Department (HOSPITAL_COMMUNITY): Payer: Medicare Other

## 2019-11-27 ENCOUNTER — Other Ambulatory Visit: Payer: Self-pay

## 2019-11-27 ENCOUNTER — Encounter (HOSPITAL_COMMUNITY): Payer: Self-pay | Admitting: Emergency Medicine

## 2019-11-27 DIAGNOSIS — E785 Hyperlipidemia, unspecified: Secondary | ICD-10-CM | POA: Diagnosis present

## 2019-11-27 DIAGNOSIS — G8929 Other chronic pain: Secondary | ICD-10-CM | POA: Diagnosis present

## 2019-11-27 DIAGNOSIS — N2889 Other specified disorders of kidney and ureter: Secondary | ICD-10-CM | POA: Diagnosis not present

## 2019-11-27 DIAGNOSIS — M545 Low back pain, unspecified: Secondary | ICD-10-CM | POA: Diagnosis not present

## 2019-11-27 DIAGNOSIS — M549 Dorsalgia, unspecified: Secondary | ICD-10-CM | POA: Diagnosis present

## 2019-11-27 DIAGNOSIS — I129 Hypertensive chronic kidney disease with stage 1 through stage 4 chronic kidney disease, or unspecified chronic kidney disease: Secondary | ICD-10-CM | POA: Diagnosis present

## 2019-11-27 DIAGNOSIS — R109 Unspecified abdominal pain: Secondary | ICD-10-CM | POA: Diagnosis present

## 2019-11-27 DIAGNOSIS — E871 Hypo-osmolality and hyponatremia: Secondary | ICD-10-CM | POA: Diagnosis present

## 2019-11-27 DIAGNOSIS — D631 Anemia in chronic kidney disease: Secondary | ICD-10-CM | POA: Diagnosis present

## 2019-11-27 DIAGNOSIS — E1169 Type 2 diabetes mellitus with other specified complication: Secondary | ICD-10-CM | POA: Diagnosis present

## 2019-11-27 DIAGNOSIS — D696 Thrombocytopenia, unspecified: Secondary | ICD-10-CM | POA: Diagnosis present

## 2019-11-27 DIAGNOSIS — F17200 Nicotine dependence, unspecified, uncomplicated: Secondary | ICD-10-CM | POA: Diagnosis present

## 2019-11-27 DIAGNOSIS — Z8719 Personal history of other diseases of the digestive system: Secondary | ICD-10-CM

## 2019-11-27 DIAGNOSIS — Z90710 Acquired absence of both cervix and uterus: Secondary | ICD-10-CM

## 2019-11-27 DIAGNOSIS — E278 Other specified disorders of adrenal gland: Secondary | ICD-10-CM | POA: Diagnosis not present

## 2019-11-27 DIAGNOSIS — R52 Pain, unspecified: Secondary | ICD-10-CM

## 2019-11-27 DIAGNOSIS — I10 Essential (primary) hypertension: Secondary | ICD-10-CM | POA: Diagnosis present

## 2019-11-27 DIAGNOSIS — R6889 Other general symptoms and signs: Secondary | ICD-10-CM | POA: Diagnosis not present

## 2019-11-27 DIAGNOSIS — S37019A Minor contusion of unspecified kidney, initial encounter: Secondary | ICD-10-CM

## 2019-11-27 DIAGNOSIS — Z83438 Family history of other disorder of lipoprotein metabolism and other lipidemia: Secondary | ICD-10-CM

## 2019-11-27 DIAGNOSIS — Z8673 Personal history of transient ischemic attack (TIA), and cerebral infarction without residual deficits: Secondary | ICD-10-CM

## 2019-11-27 DIAGNOSIS — E1165 Type 2 diabetes mellitus with hyperglycemia: Secondary | ICD-10-CM | POA: Diagnosis present

## 2019-11-27 DIAGNOSIS — Z8249 Family history of ischemic heart disease and other diseases of the circulatory system: Secondary | ICD-10-CM

## 2019-11-27 DIAGNOSIS — D62 Acute posthemorrhagic anemia: Secondary | ICD-10-CM | POA: Diagnosis present

## 2019-11-27 DIAGNOSIS — Z20822 Contact with and (suspected) exposure to covid-19: Secondary | ICD-10-CM | POA: Diagnosis present

## 2019-11-27 DIAGNOSIS — Z7984 Long term (current) use of oral hypoglycemic drugs: Secondary | ICD-10-CM

## 2019-11-27 DIAGNOSIS — Z743 Need for continuous supervision: Secondary | ICD-10-CM | POA: Diagnosis not present

## 2019-11-27 DIAGNOSIS — E039 Hypothyroidism, unspecified: Secondary | ICD-10-CM | POA: Diagnosis present

## 2019-11-27 DIAGNOSIS — I160 Hypertensive urgency: Secondary | ICD-10-CM | POA: Diagnosis present

## 2019-11-27 DIAGNOSIS — N179 Acute kidney failure, unspecified: Secondary | ICD-10-CM | POA: Diagnosis present

## 2019-11-27 DIAGNOSIS — Y848 Other medical procedures as the cause of abnormal reaction of the patient, or of later complication, without mention of misadventure at the time of the procedure: Secondary | ICD-10-CM | POA: Diagnosis present

## 2019-11-27 DIAGNOSIS — Z87891 Personal history of nicotine dependence: Secondary | ICD-10-CM

## 2019-11-27 DIAGNOSIS — N184 Chronic kidney disease, stage 4 (severe): Secondary | ICD-10-CM | POA: Diagnosis present

## 2019-11-27 DIAGNOSIS — M1712 Unilateral primary osteoarthritis, left knee: Secondary | ICD-10-CM | POA: Diagnosis present

## 2019-11-27 DIAGNOSIS — Z79899 Other long term (current) drug therapy: Secondary | ICD-10-CM

## 2019-11-27 DIAGNOSIS — E1122 Type 2 diabetes mellitus with diabetic chronic kidney disease: Secondary | ICD-10-CM | POA: Diagnosis present

## 2019-11-27 DIAGNOSIS — S37011A Minor contusion of right kidney, initial encounter: Secondary | ICD-10-CM | POA: Diagnosis not present

## 2019-11-27 DIAGNOSIS — R011 Cardiac murmur, unspecified: Secondary | ICD-10-CM | POA: Diagnosis present

## 2019-11-27 DIAGNOSIS — Z9181 History of falling: Secondary | ICD-10-CM

## 2019-11-27 DIAGNOSIS — Z7982 Long term (current) use of aspirin: Secondary | ICD-10-CM

## 2019-11-27 DIAGNOSIS — S37092A Other injury of left kidney, initial encounter: Secondary | ICD-10-CM | POA: Diagnosis not present

## 2019-11-27 DIAGNOSIS — K219 Gastro-esophageal reflux disease without esophagitis: Secondary | ICD-10-CM | POA: Diagnosis present

## 2019-11-27 DIAGNOSIS — Z833 Family history of diabetes mellitus: Secondary | ICD-10-CM

## 2019-11-27 LAB — CBC
HCT: 27.6 % — ABNORMAL LOW (ref 36.0–46.0)
Hemoglobin: 9.1 g/dL — ABNORMAL LOW (ref 12.0–15.0)
MCH: 32.4 pg (ref 26.0–34.0)
MCHC: 33 g/dL (ref 30.0–36.0)
MCV: 98.2 fL (ref 80.0–100.0)
Platelets: 223 10*3/uL (ref 150–400)
RBC: 2.81 MIL/uL — ABNORMAL LOW (ref 3.87–5.11)
RDW: 14.6 % (ref 11.5–15.5)
WBC: 7.1 10*3/uL (ref 4.0–10.5)
nRBC: 0 % (ref 0.0–0.2)

## 2019-11-27 LAB — BASIC METABOLIC PANEL
Anion gap: 8 (ref 5–15)
BUN: 67 mg/dL — ABNORMAL HIGH (ref 8–23)
CO2: 24 mmol/L (ref 22–32)
Calcium: 8.4 mg/dL — ABNORMAL LOW (ref 8.9–10.3)
Chloride: 101 mmol/L (ref 98–111)
Creatinine, Ser: 3.08 mg/dL — ABNORMAL HIGH (ref 0.44–1.00)
GFR, Estimated: 16 mL/min — ABNORMAL LOW (ref >60)
Glucose, Bld: 262 mg/dL — ABNORMAL HIGH (ref 70–99)
Potassium: 3.7 mmol/L (ref 3.5–5.1)
Sodium: 133 mmol/L — ABNORMAL LOW (ref 135–145)

## 2019-11-27 MED ORDER — ONDANSETRON HCL 4 MG/2ML IJ SOLN
4.0000 mg | Freq: Once | INTRAMUSCULAR | Status: AC
  Administered 2019-11-28: 4 mg via INTRAVENOUS
  Filled 2019-11-27: qty 2

## 2019-11-27 MED ORDER — SODIUM CHLORIDE 0.9 % IV BOLUS
1000.0000 mL | Freq: Once | INTRAVENOUS | Status: AC
  Administered 2019-11-28: 1000 mL via INTRAVENOUS

## 2019-11-27 MED ORDER — MORPHINE SULFATE (PF) 4 MG/ML IV SOLN
4.0000 mg | Freq: Once | INTRAVENOUS | Status: AC
  Administered 2019-11-28: 4 mg via INTRAVENOUS
  Filled 2019-11-27: qty 1

## 2019-11-27 NOTE — ED Triage Notes (Signed)
Patient brought from softball game by [facility]. Patient complaining of right kidney pain. Patient had an biopsy on Nov. 1 for this right kidney.

## 2019-11-27 NOTE — ED Provider Notes (Signed)
[facility] DEPT Provider Note   CSN: [id number] Arrival date & time: 11/27/19  2125     History Chief Complaint  Patient presents with  . Flank Pain    [name] is a 69 y.o. female.  69 yo F with a chief complaints of right flank pain.  Patient had a kidney biopsy done 48 hours ago.  Has had a fall since then as well.  Complaining of pain overlying her right side.  Radiates around into her abdomen.  Has felt nauseated but no vomiting.  No fevers.  Denies urinary symptoms.  Denies hematuria.  Denies radiation to the leg.  The history is provided by the patient.  Flank Pain This is a new problem. The current episode started 2 days ago. The problem occurs constantly. The problem has not changed since onset.Pertinent negatives include no chest pain, no headaches and no shortness of breath. Nothing aggravates the symptoms. Nothing relieves the symptoms. She has tried nothing for the symptoms. The treatment provided no relief.       Past Medical History:  Diagnosis Date  . Acute kidney injury ([place]) 01/08/2015  . Arthritis   . Chronic back pain   . Constipation   . Cough   . Diabetes mellitus, type 2 ([place])   . Diverticulosis   . Fibroid    patient thinks this was the reason for her hysterectomy  . GERD (gastroesophageal reflux disease)   . Heart murmur   . History of sebaceous cyst   . Hyperlipemia   . Hyperplastic colon polyp   . Hypertension   . Hyperthyroidism   . Lumbar radiculopathy   . Shortness of breath 09/13/2013  . Stroke ([place]) 2015?   x4   . Tobacco abuse   . 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 Vitamin D toxicity, accidental or unintentional, initial encounter 09/09/2017  . Ataxia 09/09/2017  . Type 2 diabetes mellitus with hyperglycemia ([place]) 09/09/2017  . Chronic kidney disease (CKD)   . Olecranon bursitis of right elbow  03/09/2017  . Thyroid nodule 06/29/2016  . Type 2 diabetes mellitus ([place]) 06/13/2016  . Vitamin D deficiency 01/12/2016  . Medicare annual wellness visit, initial 01/11/2016  . Lacunar infarct, acute ([place]) 03/23/2015  . Non compliance w medication regimen 01/16/2015  . Anemia 01/08/2015  . Stroke ([place]) 01/08/2015  . Hyperlipemia   . Carotid bruit present 12/13/2012  . Routine health maintenance 10/24/2010  . PERIPHERAL EDEMA 09/10/2009  . ELECTROCARDIOGRAM, ABNORMAL 04/11/2008  . HEART MURMUR, SYSTOLIC 99/35/7017  . SEBACEOUS CYST, NECK 12/13/2006  . Hyperthyroidism 09/27/2006  . Secondary diabetes with peripheral vascular disease ([place]) 09/27/2006  . Hyperlipidemia LDL goal <70 09/27/2006  . TOBACCO ABUSE 09/27/2006  . Essential hypertension 09/27/2006    Past Surgical History:  Procedure Laterality Date  . ABDOMINAL HYSTERECTOMY     --?ovaries remain  . COLONOSCOPY W/ BIOPSIES  09/11/2008  . RETINOPATHY SURGERY Bilateral 2013   Dr. Zigmund Daniel     OB History    Gravida  1   Para  1   Term      Preterm      AB      Living  1     SAB      TAB      Ectopic      Multiple      Live Births  Family History  Problem Relation Age of Onset  . Hypertension Mother   . Sleep apnea Mother   . Diabetes Mother   . Hyperlipidemia Mother   . Lung cancer Father        smoker  . Hypertension Sister   . Diabetes Sister   . Hypertension Brother   . Hypertension Sister   . Diabetes Brother   . Hypertension Brother   . Breast cancer Neg Hx   . Colon cancer Neg Hx   . Esophageal cancer Neg Hx   . Pancreatic cancer Neg Hx   . Liver disease Neg Hx   . Colon polyps Neg Hx     Social History   Tobacco Use  . Smoking status: Former Smoker    Packs/day: 0.50    Years: 35.00    Pack years: 17.50    Types: Cigarettes    Quit date: 07/07/2014    Years since quitting: 5.3  . Smokeless tobacco: Never Used  Vaping Use  . Vaping Use: Never used    Substance Use Topics  . Alcohol use: No  . Drug use: No    Home Medications Prior to Admission medications   Medication Sig Start Date End Date Taking? Authorizing Provider  aspirin 325 MG tablet Take 325 mg by mouth daily.   Yes [provider]  atorvastatin (LIPITOR) 80 MG tablet TAKE 1 TABLET BY MOUTH EVERY DAY Patient taking differently: Take 80 mg by mouth daily.  06/12/19  Yes Marrian Salvage, FNP  bromocriptine (PARLODEL) 2.5 MG tablet TAKE 1/2 TABLET BY MOUTH EVERY DAY Patient taking differently: Take 2.5 mg by mouth daily.  11/14/19  Yes Renato Shin, MD  calcitRIOL (ROCALTROL) 0.25 MCG capsule Take 0.25 mcg by mouth daily.   Yes [provider]  cloNIDine (CATAPRES) 0.2 MG tablet Take 1 tablet (0.2 mg total) by mouth 3 (three) times daily. 05/21/19  Yes Marrian Salvage, FNP  furosemide (LASIX) 40 MG tablet Take 1 tablet (40 mg total) by mouth See admin instructions. Take 1 tablet daily, can take another pill if ankles are swelling -Hold for the next 3 days and resume on 09/13/2017 Patient taking differently: Take 40 mg by mouth daily as needed for edema.  09/09/17  Yes Elgergawy, Silver Huguenin, MD  metoprolol succinate (TOPROL-XL) 100 MG 24 hr tablet Take 1 tablet (100 mg total) by mouth daily. for high blood pressure 08/20/19  Yes Marrian Salvage, FNP  minoxidil (LONITEN) 2.5 MG tablet Take 2.5 mg by mouth 3 (three) times daily.   Yes [provider]  pantoprazole (PROTONIX) 40 MG tablet Take 1 tablet (40 mg total) by mouth daily. 06/12/19  Yes Marrian Salvage, FNP  polyethylene glycol (MIRALAX / GLYCOLAX) 17 g packet Take 34-51 g by mouth daily as needed for moderate constipation.   Yes [provider]  repaglinide (PRANDIN) 2 MG tablet TAKE 1 TABLET (2 MG TOTAL) BY MOUTH 3 (THREE) TIMES DAILY BEFORE MEALS. 02/22/19  Yes Renato Shin, MD  telmisartan-hydrochlorothiazide (MICARDIS HCT) 80-25 MG tablet TAKE 1 TABLET BY MOUTH  EVERY DAY Patient taking differently: Take 1 tablet by mouth daily.  10/03/19  Yes Marrian Salvage, FNP  amLODipine (NORVASC) 10 MG tablet Take 1 tablet (10 mg total) by mouth daily. Patient not taking: Reported on 11/20/2019 03/26/15   [place], FNP  Blood Glucose Monitoring Suppl ([place]) w/Device KIT 1 each by Does not apply route 2 (two) times daily. E11.9  [provider]  glucose blood (ONETOUCH VERIO) test strip USE AS INSTRUCTED (TESTS 2 TIMES A DAY) 05/20/19   Marrian Salvage, FNP  OneTouch Delica Lancets 62E MISC 1 each by Does not apply route 2 (two) times daily. E11.9    [provider]  vitamin B-12 (CYANOCOBALAMIN) 100 MCG tablet Take 1 tablet (100 mcg total) by mouth at bedtime. Patient not taking: Reported on 11/05/2019 08/20/19   Marrian Salvage, Kathryn    Allergies    Patient has no known allergies.  Review of Systems   Review of Systems  Constitutional: Negative for chills and fever.  HENT: Negative for congestion and rhinorrhea.   Eyes: Negative for redness and visual disturbance.  Respiratory: Negative for shortness of breath and wheezing.   Cardiovascular: Negative for chest pain and palpitations.  Gastrointestinal: Negative for nausea and vomiting.  Genitourinary: Positive for flank pain. Negative for dysuria and urgency.  Musculoskeletal: Negative for arthralgias and myalgias.  Skin: Negative for pallor and wound.  Neurological: Negative for dizziness and headaches.    Physical Exam Updated Vital Signs BP (!) 254/84   Pulse 62   Temp 97.7 F (36.5 C) (Oral)   Resp 17   Ht $R'5\' 2"'Mm$  (1.575 m)   Wt 62.6 kg   SpO2 92%   BMI 25.24 kg/m   Physical Exam Vitals and nursing note reviewed.  Constitutional:      General: She is not in acute distress.    Appearance: She is well-developed. She is not diaphoretic.  HENT:     Head: Normocephalic and atraumatic.  Eyes:     Pupils: Pupils are equal,  round, and reactive to light.  Cardiovascular:     Rate and Rhythm: Normal rate and regular rhythm.     Heart sounds: No murmur heard.  No friction rub. No gallop.   Pulmonary:     Effort: Pulmonary effort is normal.     Breath sounds: No wheezing or rales.  Abdominal:     General: There is no distension.     Palpations: Abdomen is soft.     Tenderness: There is no abdominal tenderness.     Comments: Mild diffuse tenderness to the abdomen.  No CVA TTP.  PMS intact to the RLE.   Musculoskeletal:        General: No tenderness.     Cervical back: Normal range of motion and neck supple.  Skin:    General: Skin is warm and dry.  Neurological:     Mental Status: She is alert and oriented to person, place, and time.  Psychiatric:        Behavior: Behavior normal.     ED Results / Procedures / Treatments   Labs (all labs ordered are listed, but only abnormal results are displayed) Labs Reviewed  URINALYSIS, ROUTINE W REFLEX MICROSCOPIC - Abnormal; Notable for the following components:      Result Value   APPearance HAZY (*)    Glucose, UA 150 (*)    Hgb urine dipstick SMALL (*)    Protein, ur >=300 (*)    Bacteria, UA RARE (*)    All other components within normal limits  BASIC METABOLIC PANEL - Abnormal; Notable for the following components:   Sodium 133 (*)    Glucose, Bld 262 (*)    BUN 67 (*)    Creatinine, Ser 3.08 (*)    Calcium 8.4 (*)    GFR, Estimated 16 (*)    All other components  within normal limits  CBC - Abnormal; Notable for the following components:   RBC 2.81 (*)    Hemoglobin 9.1 (*)    HCT 27.6 (*)    All other components within normal limits    EKG None  Radiology CT L-SPINE NO CHARGE  Result Date: 11/28/2019 CLINICAL DATA:  Initial evaluation for right flank pain status post recent right renal biopsy. EXAM: CT LUMBAR SPINE WITHOUT CONTRAST TECHNIQUE: Multidetector CT imaging of the lumbar spine was performed without intravenous contrast  administration. Multiplanar CT image reconstructions were also generated. COMPARISON:  Concomitant CT of the abdomen and pelvis from the same day. FINDINGS: Segmentation: Standard. Alignment: Physiologic.  No listhesis. Vertebrae: Vertebral body height maintained without acute or chronic fracture. Visualized sacrum and pelvis intact. SI joints approximated symmetric. Small benign bone island noted within the mid sacrum. No other discrete or worrisome osseous lesions. Paraspinal and other soft tissues: Right pleural effusion partially visualized. Large right perinephric hematoma with stranding/hemorrhage within the right retroperitoneal space, partially visualized, better seen on concomitant CT of the abdomen and pelvis. Moderate aorto bi-iliac atherosclerotic disease. Disc levels: L1-2:  Negative interspace.  Mild facet hypertrophy.  No stenosis. L2-3: Diffuse disc bulge with disc desiccation. Mild reactive endplate change. Mild facet hypertrophy. Resultant mild spinal stenosis. Foramina remain patent. L3-4: Mild diffuse disc bulge. Mild bilateral facet hypertrophy. Probable mild spinal stenosis. Mild bilateral L3 foraminal narrowing. L4-5: Advanced degenerative intervertebral disc space narrowing with ankylosis of the L4 and L5 vertebral bodies, and obliteration of the L4-5 interspace. Associated circumferential marginal endplate spurring. Moderate facet and ligament flavum hypertrophy. Resultant moderate spinal stenosis. Moderate to severe bilateral L4 foraminal narrowing. L5-S1: Mild disc bulge. Severe bilateral facet degeneration, right worse than left. Resultant mild narrowing of the lateral recesses bilaterally. Foramina remain patent. IMPRESSION: 1. No acute abnormality within the lumbar spine. 2. Large right perinephric hematoma with stranding/hemorrhage within the right retroperitoneal space, partially visualized, better evaluated on concomitant CT of the abdomen and pelvis. 3. 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 Resultant moderate spinal stenosis with moderate to severe bilateral L4 foraminal narrowing. 4. Additional mild noncompressive disc bulging elsewhere within the lumbar spine as above. No other significant stenosis or neural impingement. 5. Right pleural effusion, partially visualized. 6. Aortic Atherosclerosis (ICD10-I70.0). Electronically Signed   By: Jeannine Boga M.D.   On: 11/28/2019 01:26   CT Renal Stone Study  Result Date: 11/28/2019 CLINICAL DATA:  Right flank pain recent right kidney biopsy. EXAM: CT ABDOMEN AND PELVIS WITHOUT CONTRAST TECHNIQUE: Multidetector CT imaging of the abdomen and pelvis was performed following the standard protocol without IV contrast. COMPARISON:  08/14/2017 FINDINGS: Lower chest: Trace right pleural effusion.  Right base atelectasis. Hepatobiliary: No focal hepatic abnormality. Gallbladder unremarkable. Pancreas: No focal abnormality or ductal dilatation. Spleen: No focal abnormality.  Normal size. Adrenals/Urinary Tract: High density large crescentic shaped fluid collection noted around the right kidney compatible with perinephric hematoma, 8.9 x 7.4 cm. This compresses the right kidney. Punctate nonobstructing stone in the upper pole of the right kidney. No hydronephrosis. Hematoma also appears to extend around the right adrenal gland. Stranding also continues in the right retroperitoneum inferior to the right kidney. Left adrenal gland is unremarkable. Urinary bladder unremarkable. Stomach/Bowel: Stomach, large and small bowel grossly unremarkable. Vascular/Lymphatic: Heavily calcified aorta and iliac vessels. No aneurysm or adenopathy. Reproductive: No mass. Other: No free fluid or free air. Musculoskeletal: No acute bony abnormality. IMPRESSION: Large  right perinephric hematoma measuring 8.9 x 7.4 cm. This has mass effect on the right kidney. Recommend clinical correlation for possible hypertension and  Page kidney. Stranding also continues around the right adrenal gland and inferior to the right kidney in the right retroperitoneum. Trace right pleural effusion.  No pneumothorax. These results were called by telephone at the time of interpretation on 11/28/2019 at 12:08 am to provider Massiah Longanecker , who verbally acknowledged these results. Electronically Signed   By: Rolm Baptise M.D.   On: 11/28/2019 00:12    Procedures Procedures (including critical care time) Procedure note: Ultrasound Guided Peripheral IV Ultrasound guided peripheral 1.88 inch angiocath IV placement performed by me. Indications: Nursing unable to place IV. Details: The antecubital fossa and upper arm were evaluated with a multifrequency linear probe. Patent brachial veins were noted. 1 attempt was made to cannulate a vein under realtime US guidance with successful cannulation of the vein and catheter placement. There is return of non-pulsatile dark red blood. The patient tolerated the procedure well without complications. Images archived electronically.  CPT codes: [phone] and [phone]  Medications Ordered in ED Medications  sodium chloride 0.9 % bolus 1,000 mL (has no administration in time range)  morphine 4 MG/ML injection 4 mg (has no administration in time range)  ondansetron (ZOFRAN) injection 4 mg (has no administration in time range)  enalaprilat (VASOTEC) injection 1.25 mg (has no administration in time range)    ED Course  I have reviewed the triage vital signs and the nursing notes.  Pertinent labs & imaging results that were available during my care of the patient were reviewed by me and considered in my medical decision making (see chart for details).    MDM Rules/Calculators/A&P                          69 yo F with a chief complaints of right flank pain.  This is ongoing after having a renal biopsy.  Has some mild worsening renal dysfunction here.  Will obtain a urine.  Pain and nausea medicine IV fluids CT to  further evaluate.  CT scan with a rather large perinephric hematoma with some compression of the kidney.  The radiologist called me and said he this could cause page kidney.  The patient is significantly hypertensive here.  I discussed the case with Dr. Hollie Salk, nephrology.  She felt that this was something that interventional radiology may consider placing a drain.  I discussed this with Dr. Anselm Pancoast, interventional radiology.   Recommended medical management.  If fails would consider drain placement. Will discuss with the hospitalist.  CRITICAL CARE Performed by: Cecilio Asper   Total critical care time: 35 minutes  Critical care time was exclusive of separately billable procedures and treating other patients.  Critical care was necessary to treat or prevent imminent or life-threatening deterioration.  Critical care was time spent personally by me on the following activities: development of treatment plan with patient and/or surrogate as well as nursing, discussions with consultants, evaluation of patient's response to treatment, examination of patient, obtaining history from patient or surrogate, ordering and performing treatments and interventions, ordering and review of laboratory studies, ordering and review of radiographic studies, pulse oximetry and re-evaluation of patient's condition.  The patients results and plan were reviewed and discussed.   Any x-rays performed were independently reviewed by myself.   Differential diagnosis were considered with the presenting HPI.  Medications  sodium chloride 0.9 %  bolus 1,000 mL (has no administration in time range)  morphine 4 MG/ML injection 4 mg (has no administration in time range)  ondansetron (ZOFRAN) injection 4 mg (has no administration in time range)  enalaprilat (VASOTEC) injection 1.25 mg (has no administration in time range)    Vitals:   11/27/19 2131 11/27/19 2133 11/27/19 2308  BP:  (!) 221/87 (!) 254/84  Pulse:  60 62    Resp:  16 17  Temp:  97.7 F (36.5 C) 97.7 F (36.5 C)  TempSrc:  Oral Oral  SpO2:  98% 92%  Weight: 62.6 kg    Height: $Remove'5\' 2"'ujqELFX$  (1.575 m)      Final diagnoses:  Back pain  Perinephric hematoma  AKI (acute kidney injury) ([place])    Admission/ observation were discussed with the admitting physician, patient and/or family and they are comfortable with the plan.     Final Clinical Impression(s) / ED Diagnoses Final diagnoses:  Back pain  Perinephric hematoma  AKI (acute kidney injury) [facility])    Rx / DC Orders ED Discharge Orders    None       Deno Etienne, DO 11/28/19 0141

## 2019-11-28 ENCOUNTER — Encounter (HOSPITAL_COMMUNITY): Payer: Self-pay | Admitting: Internal Medicine

## 2019-11-28 DIAGNOSIS — E039 Hypothyroidism, unspecified: Secondary | ICD-10-CM | POA: Diagnosis not present

## 2019-11-28 DIAGNOSIS — F17210 Nicotine dependence, cigarettes, uncomplicated: Secondary | ICD-10-CM | POA: Diagnosis not present

## 2019-11-28 DIAGNOSIS — Z8673 Personal history of transient ischemic attack (TIA), and cerebral infarction without residual deficits: Secondary | ICD-10-CM | POA: Diagnosis not present

## 2019-11-28 DIAGNOSIS — Y848 Other medical procedures as the cause of abnormal reaction of the patient, or of later complication, without mention of misadventure at the time of the procedure: Secondary | ICD-10-CM | POA: Diagnosis present

## 2019-11-28 DIAGNOSIS — E1129 Type 2 diabetes mellitus with other diabetic kidney complication: Secondary | ICD-10-CM | POA: Diagnosis not present

## 2019-11-28 DIAGNOSIS — K219 Gastro-esophageal reflux disease without esophagitis: Secondary | ICD-10-CM | POA: Diagnosis not present

## 2019-11-28 DIAGNOSIS — M6281 Muscle weakness (generalized): Secondary | ICD-10-CM | POA: Diagnosis not present

## 2019-11-28 DIAGNOSIS — S37092A Other injury of left kidney, initial encounter: Secondary | ICD-10-CM | POA: Diagnosis not present

## 2019-11-28 DIAGNOSIS — R809 Proteinuria, unspecified: Secondary | ICD-10-CM | POA: Diagnosis not present

## 2019-11-28 DIAGNOSIS — I129 Hypertensive chronic kidney disease with stage 1 through stage 4 chronic kidney disease, or unspecified chronic kidney disease: Secondary | ICD-10-CM | POA: Diagnosis not present

## 2019-11-28 DIAGNOSIS — M549 Dorsalgia, unspecified: Secondary | ICD-10-CM | POA: Diagnosis not present

## 2019-11-28 DIAGNOSIS — R262 Difficulty in walking, not elsewhere classified: Secondary | ICD-10-CM | POA: Diagnosis not present

## 2019-11-28 DIAGNOSIS — S37011A Minor contusion of right kidney, initial encounter: Secondary | ICD-10-CM | POA: Diagnosis not present

## 2019-11-28 DIAGNOSIS — I708 Atherosclerosis of other arteries: Secondary | ICD-10-CM | POA: Diagnosis not present

## 2019-11-28 DIAGNOSIS — R109 Unspecified abdominal pain: Secondary | ICD-10-CM | POA: Diagnosis not present

## 2019-11-28 DIAGNOSIS — G8929 Other chronic pain: Secondary | ICD-10-CM | POA: Diagnosis not present

## 2019-11-28 DIAGNOSIS — S37091A Other injury of right kidney, initial encounter: Secondary | ICD-10-CM | POA: Diagnosis not present

## 2019-11-28 DIAGNOSIS — Z833 Family history of diabetes mellitus: Secondary | ICD-10-CM | POA: Diagnosis not present

## 2019-11-28 DIAGNOSIS — R2681 Unsteadiness on feet: Secondary | ICD-10-CM | POA: Diagnosis not present

## 2019-11-28 DIAGNOSIS — R279 Unspecified lack of coordination: Secondary | ICD-10-CM | POA: Diagnosis not present

## 2019-11-28 DIAGNOSIS — E278 Other specified disorders of adrenal gland: Secondary | ICD-10-CM | POA: Diagnosis not present

## 2019-11-28 DIAGNOSIS — M25462 Effusion, left knee: Secondary | ICD-10-CM | POA: Diagnosis not present

## 2019-11-28 DIAGNOSIS — E785 Hyperlipidemia, unspecified: Secondary | ICD-10-CM | POA: Diagnosis not present

## 2019-11-28 DIAGNOSIS — Z90710 Acquired absence of both cervix and uterus: Secondary | ICD-10-CM | POA: Diagnosis not present

## 2019-11-28 DIAGNOSIS — N2889 Other specified disorders of kidney and ureter: Secondary | ICD-10-CM | POA: Diagnosis not present

## 2019-11-28 DIAGNOSIS — Z743 Need for continuous supervision: Secondary | ICD-10-CM | POA: Diagnosis not present

## 2019-11-28 DIAGNOSIS — D849 Immunodeficiency, unspecified: Secondary | ICD-10-CM | POA: Diagnosis not present

## 2019-11-28 DIAGNOSIS — K5790 Diverticulosis of intestine, part unspecified, without perforation or abscess without bleeding: Secondary | ICD-10-CM | POA: Diagnosis not present

## 2019-11-28 DIAGNOSIS — Z87891 Personal history of nicotine dependence: Secondary | ICD-10-CM | POA: Diagnosis not present

## 2019-11-28 DIAGNOSIS — R52 Pain, unspecified: Secondary | ICD-10-CM | POA: Diagnosis not present

## 2019-11-28 DIAGNOSIS — S37019A Minor contusion of unspecified kidney, initial encounter: Secondary | ICD-10-CM | POA: Diagnosis not present

## 2019-11-28 DIAGNOSIS — E1165 Type 2 diabetes mellitus with hyperglycemia: Secondary | ICD-10-CM | POA: Diagnosis not present

## 2019-11-28 DIAGNOSIS — F17200 Nicotine dependence, unspecified, uncomplicated: Secondary | ICD-10-CM | POA: Diagnosis not present

## 2019-11-28 DIAGNOSIS — S37019S Minor contusion of unspecified kidney, sequela: Secondary | ICD-10-CM | POA: Diagnosis not present

## 2019-11-28 DIAGNOSIS — D649 Anemia, unspecified: Secondary | ICD-10-CM | POA: Diagnosis not present

## 2019-11-28 DIAGNOSIS — M4805 Spinal stenosis, thoracolumbar region: Secondary | ICD-10-CM | POA: Diagnosis not present

## 2019-11-28 DIAGNOSIS — K661 Hemoperitoneum: Secondary | ICD-10-CM | POA: Diagnosis not present

## 2019-11-28 DIAGNOSIS — E871 Hypo-osmolality and hyponatremia: Secondary | ICD-10-CM | POA: Diagnosis not present

## 2019-11-28 DIAGNOSIS — N151 Renal and perinephric abscess: Secondary | ICD-10-CM | POA: Diagnosis not present

## 2019-11-28 DIAGNOSIS — N184 Chronic kidney disease, stage 4 (severe): Secondary | ICD-10-CM | POA: Diagnosis not present

## 2019-11-28 DIAGNOSIS — M545 Low back pain, unspecified: Secondary | ICD-10-CM | POA: Diagnosis not present

## 2019-11-28 DIAGNOSIS — R600 Localized edema: Secondary | ICD-10-CM | POA: Diagnosis not present

## 2019-11-28 DIAGNOSIS — M25562 Pain in left knee: Secondary | ICD-10-CM | POA: Diagnosis not present

## 2019-11-28 DIAGNOSIS — Z8249 Family history of ischemic heart disease and other diseases of the circulatory system: Secondary | ICD-10-CM | POA: Diagnosis not present

## 2019-11-28 DIAGNOSIS — N179 Acute kidney failure, unspecified: Secondary | ICD-10-CM | POA: Diagnosis not present

## 2019-11-28 DIAGNOSIS — Z9889 Other specified postprocedural states: Secondary | ICD-10-CM | POA: Diagnosis not present

## 2019-11-28 DIAGNOSIS — D696 Thrombocytopenia, unspecified: Secondary | ICD-10-CM | POA: Diagnosis not present

## 2019-11-28 DIAGNOSIS — Z20822 Contact with and (suspected) exposure to covid-19: Secondary | ICD-10-CM | POA: Diagnosis present

## 2019-11-28 DIAGNOSIS — D631 Anemia in chronic kidney disease: Secondary | ICD-10-CM | POA: Diagnosis not present

## 2019-11-28 DIAGNOSIS — I700 Atherosclerosis of aorta: Secondary | ICD-10-CM | POA: Diagnosis not present

## 2019-11-28 DIAGNOSIS — R011 Cardiac murmur, unspecified: Secondary | ICD-10-CM | POA: Diagnosis not present

## 2019-11-28 DIAGNOSIS — M1712 Unilateral primary osteoarthritis, left knee: Secondary | ICD-10-CM | POA: Diagnosis not present

## 2019-11-28 DIAGNOSIS — I10 Essential (primary) hypertension: Secondary | ICD-10-CM | POA: Diagnosis not present

## 2019-11-28 DIAGNOSIS — Z8719 Personal history of other diseases of the digestive system: Secondary | ICD-10-CM | POA: Diagnosis not present

## 2019-11-28 DIAGNOSIS — R1031 Right lower quadrant pain: Secondary | ICD-10-CM | POA: Diagnosis not present

## 2019-11-28 DIAGNOSIS — E1122 Type 2 diabetes mellitus with diabetic chronic kidney disease: Secondary | ICD-10-CM | POA: Diagnosis not present

## 2019-11-28 DIAGNOSIS — R531 Weakness: Secondary | ICD-10-CM | POA: Diagnosis not present

## 2019-11-28 DIAGNOSIS — D62 Acute posthemorrhagic anemia: Secondary | ICD-10-CM | POA: Diagnosis not present

## 2019-11-28 LAB — COMPREHENSIVE METABOLIC PANEL
ALT: 29 U/L (ref 0–44)
AST: 27 U/L (ref 15–41)
Albumin: 2.8 g/dL — ABNORMAL LOW (ref 3.5–5.0)
Alkaline Phosphatase: 87 U/L (ref 38–126)
Anion gap: 11 (ref 5–15)
BUN: 65 mg/dL — ABNORMAL HIGH (ref 8–23)
CO2: 22 mmol/L (ref 22–32)
Calcium: 8 mg/dL — ABNORMAL LOW (ref 8.9–10.3)
Chloride: 102 mmol/L (ref 98–111)
Creatinine, Ser: 3 mg/dL — ABNORMAL HIGH (ref 0.44–1.00)
GFR, Estimated: 16 mL/min — ABNORMAL LOW (ref >60)
Glucose, Bld: 323 mg/dL — ABNORMAL HIGH (ref 70–99)
Potassium: 4.5 mmol/L (ref 3.5–5.1)
Sodium: 135 mmol/L (ref 135–145)
Total Bilirubin: 0.7 mg/dL (ref 0.3–1.2)
Total Protein: 6.4 g/dL — ABNORMAL LOW (ref 6.5–8.1)

## 2019-11-28 LAB — CBC
HCT: 22.3 % — ABNORMAL LOW (ref 36.0–46.0)
Hemoglobin: 7.1 g/dL — ABNORMAL LOW (ref 12.0–15.0)
MCH: 31.6 pg (ref 26.0–34.0)
MCHC: 31.8 g/dL (ref 30.0–36.0)
MCV: 99.1 fL (ref 80.0–100.0)
Platelets: 190 10*3/uL (ref 150–400)
RBC: 2.25 MIL/uL — ABNORMAL LOW (ref 3.87–5.11)
RDW: 14.3 % (ref 11.5–15.5)
WBC: 9.7 10*3/uL (ref 4.0–10.5)
nRBC: 0 % (ref 0.0–0.2)

## 2019-11-28 LAB — BASIC METABOLIC PANEL
Anion gap: 11 (ref 5–15)
BUN: 66 mg/dL — ABNORMAL HIGH (ref 8–23)
CO2: 22 mmol/L (ref 22–32)
Calcium: 8.1 mg/dL — ABNORMAL LOW (ref 8.9–10.3)
Chloride: 101 mmol/L (ref 98–111)
Creatinine, Ser: 2.76 mg/dL — ABNORMAL HIGH (ref 0.44–1.00)
GFR, Estimated: 18 mL/min — ABNORMAL LOW (ref >60)
Glucose, Bld: 243 mg/dL — ABNORMAL HIGH (ref 70–99)
Potassium: 4.4 mmol/L (ref 3.5–5.1)
Sodium: 134 mmol/L — ABNORMAL LOW (ref 135–145)

## 2019-11-28 LAB — URINALYSIS, ROUTINE W REFLEX MICROSCOPIC
Bilirubin Urine: NEGATIVE
Glucose, UA: 150 mg/dL — AB
Ketones, ur: NEGATIVE mg/dL
Leukocytes,Ua: NEGATIVE
Nitrite: NEGATIVE
Protein, ur: >=300 mg/dL — AB
Specific Gravity, Urine: 1.011 (ref 1.005–1.030)
pH: 6 (ref 5.0–8.0)

## 2019-11-28 LAB — CREATININE, URINE, RANDOM: Creatinine, Urine: 149.06 mg/dL

## 2019-11-28 LAB — RESPIRATORY PANEL BY RT PCR (FLU A&B, COVID)
Influenza A by PCR: NEGATIVE
Influenza B by PCR: NEGATIVE
SARS Coronavirus 2 by RT PCR: NEGATIVE

## 2019-11-28 LAB — GLUCOSE, CAPILLARY
Glucose-Capillary: 213 mg/dL — ABNORMAL HIGH (ref 70–99)
Glucose-Capillary: 128 mg/dL — ABNORMAL HIGH (ref 70–99)
Glucose-Capillary: 86 mg/dL (ref 70–99)

## 2019-11-28 LAB — HEMOGLOBIN AND HEMATOCRIT, BLOOD
HCT: 22.2 % — ABNORMAL LOW (ref 36.0–46.0)
Hemoglobin: 7.2 g/dL — ABNORMAL LOW (ref 12.0–15.0)

## 2019-11-28 LAB — SODIUM, URINE, RANDOM: Sodium, Ur: 17 mmol/L

## 2019-11-28 LAB — CBG MONITORING, ED: Glucose-Capillary: 266 mg/dL — ABNORMAL HIGH (ref 70–99)

## 2019-11-28 LAB — HIV ANTIBODY (ROUTINE TESTING W REFLEX): HIV Screen 4th Generation wRfx: NONREACTIVE

## 2019-11-28 LAB — HEMOGLOBIN A1C
Hgb A1c MFr Bld: 8.3 % — ABNORMAL HIGH (ref 4.8–5.6)
Mean Plasma Glucose: 191.51 mg/dL

## 2019-11-28 MED ORDER — CLONIDINE HCL 0.1 MG PO TABS
0.2000 mg | ORAL_TABLET | Freq: Once | ORAL | Status: AC
  Administered 2019-11-28: 0.2 mg via ORAL
  Filled 2019-11-28: qty 2

## 2019-11-28 MED ORDER — ASPIRIN 325 MG PO TABS
325.0000 mg | ORAL_TABLET | Freq: Every day | ORAL | Status: DC
  Administered 2019-11-28: 325 mg via ORAL
  Filled 2019-11-28: qty 1

## 2019-11-28 MED ORDER — HYDRALAZINE HCL 20 MG/ML IJ SOLN: 10.0000 mg | Freq: Four times a day (QID) | INTRAMUSCULAR | Status: DC | PRN

## 2019-11-28 MED ORDER — ALUM & MAG HYDROXIDE-SIMETH 200-200-20 MG/5ML PO SUSP
15.0000 mL | ORAL | Status: DC | PRN
  Administered 2019-11-28: 15 mL via ORAL
  Filled 2019-11-28: qty 30

## 2019-11-28 MED ORDER — PANTOPRAZOLE SODIUM 40 MG PO TBEC
40.0000 mg | DELAYED_RELEASE_TABLET | Freq: Every day | ORAL | Status: DC
  Administered 2019-11-28 – 2019-12-03 (×6): 40 mg via ORAL
  Filled 2019-11-28 (×6): qty 1

## 2019-11-28 MED ORDER — HYDROCHLOROTHIAZIDE 25 MG PO TABS
25.0000 mg | ORAL_TABLET | Freq: Every day | ORAL | Status: DC
  Filled 2019-11-28: qty 1

## 2019-11-28 MED ORDER — POLYETHYLENE GLYCOL 3350 17 G PO PACK
34.0000 g | PACK | Freq: Every day | ORAL | Status: DC | PRN
  Administered 2019-12-01: 34 g via ORAL
  Filled 2019-11-28: qty 2

## 2019-11-28 MED ORDER — METOPROLOL SUCCINATE ER 100 MG PO TB24
100.0000 mg | ORAL_TABLET | Freq: Every day | ORAL | Status: DC
  Administered 2019-11-28: 100 mg via ORAL
  Filled 2019-11-28: qty 1

## 2019-11-28 MED ORDER — ATORVASTATIN CALCIUM 40 MG PO TABS
80.0000 mg | ORAL_TABLET | Freq: Every day | ORAL | Status: DC
  Administered 2019-11-28 – 2019-12-03 (×6): 80 mg via ORAL
  Filled 2019-11-28 (×6): qty 2

## 2019-11-28 MED ORDER — HYDROCODONE-ACETAMINOPHEN 5-325 MG PO TABS
1.0000 | ORAL_TABLET | ORAL | Status: DC | PRN
  Administered 2019-11-28 – 2019-12-03 (×3): 1 via ORAL
  Filled 2019-11-28 (×3): qty 1

## 2019-11-28 MED ORDER — MINOXIDIL 2.5 MG PO TABS
2.5000 mg | ORAL_TABLET | Freq: Three times a day (TID) | ORAL | Status: DC
  Administered 2019-11-28 – 2019-12-03 (×13): 2.5 mg via ORAL
  Filled 2019-11-28 (×19): qty 1

## 2019-11-28 MED ORDER — TELMISARTAN-HCTZ 80-25 MG PO TABS: 1.0000 | ORAL_TABLET | Freq: Every day | ORAL | Status: DC

## 2019-11-28 MED ORDER — MORPHINE SULFATE (PF) 2 MG/ML IV SOLN
2.0000 mg | INTRAVENOUS | Status: DC | PRN
  Administered 2019-11-28: 2 mg via INTRAVENOUS
  Filled 2019-11-28: qty 1

## 2019-11-28 MED ORDER — INSULIN ASPART 100 UNIT/ML ~~LOC~~ SOLN
0.0000 [IU] | Freq: Every day | SUBCUTANEOUS | Status: DC
  Administered 2019-11-30 – 2019-12-01 (×2): 3 [IU] via SUBCUTANEOUS
  Administered 2019-12-02: 2 [IU] via SUBCUTANEOUS
  Filled 2019-11-28: qty 0.05

## 2019-11-28 MED ORDER — SODIUM CHLORIDE 0.9 % IV SOLN
1.0000 g | INTRAVENOUS | Status: DC
  Administered 2019-11-28 – 2019-11-29 (×2): 1 g via INTRAVENOUS
  Filled 2019-11-28: qty 10
  Filled 2019-11-28: qty 1

## 2019-11-28 MED ORDER — ACETAMINOPHEN 325 MG PO TABS: 650.0000 mg | ORAL_TABLET | Freq: Four times a day (QID) | ORAL | Status: DC | PRN

## 2019-11-28 MED ORDER — BROMOCRIPTINE MESYLATE 2.5 MG PO TABS
1.2500 mg | ORAL_TABLET | Freq: Every day | ORAL | Status: DC
  Administered 2019-11-28 – 2019-12-03 (×6): 1.25 mg via ORAL
  Filled 2019-11-28 (×7): qty 1

## 2019-11-28 MED ORDER — FUROSEMIDE 10 MG/ML IJ SOLN
40.0000 mg | Freq: Two times a day (BID) | INTRAMUSCULAR | Status: DC
  Administered 2019-11-28: 40 mg via INTRAVENOUS
  Filled 2019-11-28: qty 4

## 2019-11-28 MED ORDER — ONDANSETRON HCL 4 MG/2ML IJ SOLN: 4.0000 mg | Freq: Four times a day (QID) | INTRAMUSCULAR | Status: DC | PRN

## 2019-11-28 MED ORDER — INSULIN ASPART 100 UNIT/ML ~~LOC~~ SOLN
0.0000 [IU] | Freq: Three times a day (TID) | SUBCUTANEOUS | Status: DC
  Administered 2019-11-28: 2 [IU] via SUBCUTANEOUS
  Administered 2019-11-28: 8 [IU] via SUBCUTANEOUS
  Administered 2019-11-28: 5 [IU] via SUBCUTANEOUS
  Administered 2019-11-29: 3 [IU] via SUBCUTANEOUS
  Administered 2019-11-29 – 2019-11-30 (×2): 2 [IU] via SUBCUTANEOUS
  Administered 2019-11-30: 3 [IU] via SUBCUTANEOUS
  Administered 2019-12-01: 5 [IU] via SUBCUTANEOUS
  Administered 2019-12-01: 3 [IU] via SUBCUTANEOUS
  Administered 2019-12-01: 5 [IU] via SUBCUTANEOUS
  Administered 2019-12-02 – 2019-12-03 (×3): 3 [IU] via SUBCUTANEOUS
  Administered 2019-12-03: 5 [IU] via SUBCUTANEOUS
  Filled 2019-11-28: qty 0.15

## 2019-11-28 MED ORDER — SODIUM CHLORIDE 0.9 % IV SOLN
INTRAVENOUS | Status: DC
  Administered 2019-11-28: 1000 mL via INTRAVENOUS

## 2019-11-28 MED ORDER — ACETAMINOPHEN 650 MG RE SUPP: 650.0000 mg | Freq: Four times a day (QID) | RECTAL | Status: DC | PRN

## 2019-11-28 MED ORDER — HEPARIN SODIUM (PORCINE) 5000 UNIT/ML IJ SOLN
5000.0000 [IU] | Freq: Three times a day (TID) | INTRAMUSCULAR | Status: DC
  Administered 2019-11-28: 5000 [IU] via SUBCUTANEOUS
  Filled 2019-11-28: qty 1

## 2019-11-28 MED ORDER — CLONIDINE HCL 0.2 MG PO TABS
0.2000 mg | ORAL_TABLET | Freq: Three times a day (TID) | ORAL | Status: DC
  Administered 2019-11-28 – 2019-12-03 (×13): 0.2 mg via ORAL
  Filled 2019-11-28 (×15): qty 1

## 2019-11-28 MED ORDER — ENALAPRILAT 1.25 MG/ML IV SOLN
1.2500 mg | Freq: Once | INTRAVENOUS | Status: AC
  Administered 2019-11-28: 1.25 mg via INTRAVENOUS
  Filled 2019-11-28: qty 1

## 2019-11-28 MED ORDER — POLYETHYLENE GLYCOL 3350 17 G PO PACK: 34.0000 g | PACK | Freq: Every day | ORAL | Status: DC | PRN

## 2019-11-28 MED ORDER — ENALAPRILAT 1.25 MG/ML IV SOLN: 1.2500 mg | Freq: Four times a day (QID) | INTRAVENOUS | Status: DC

## 2019-11-28 MED ORDER — ENALAPRILAT 1.25 MG/ML IV SOLN
0.6250 mg | Freq: Four times a day (QID) | INTRAVENOUS | Status: AC
  Administered 2019-11-28 (×2): 0.625 mg via INTRAVENOUS
  Filled 2019-11-28 (×4): qty 0.5

## 2019-11-28 MED ORDER — IRBESARTAN 300 MG PO TABS
300.0000 mg | ORAL_TABLET | Freq: Every day | ORAL | Status: DC
  Filled 2019-11-28: qty 1

## 2019-11-28 MED ORDER — ONDANSETRON HCL 4 MG PO TABS: 4.0000 mg | ORAL_TABLET | Freq: Four times a day (QID) | ORAL | Status: DC | PRN

## 2019-11-28 NOTE — Progress Notes (Signed)
Referring Physician(s): Deno Etienne ([place])  Supervising Physician: Jacqulynn Cadet  Patient Status:  [facility] - In-pt  Chief Complaint: Right flank pain  Subjective:  History of proteinuria s/p right random renal biopsy in IR 15/0/5697; complicated by development of right flank pain and hypertension secondary to right perinephric hematoma. Patient laying in bed resting comfortably. She responds to voice and answers simple questions appropriately. Complains of right flank pain, rated 6/10 at this time. States she "feels much better" than last evening. States she was nauseated earlier this AM but this has subsided. No vomiting.   Allergies: Patient has no known allergies.  Medications: Prior to Admission medications   Medication Sig Start Date End Date Taking? Authorizing Provider  aspirin 325 MG tablet Take 325 mg by mouth daily.   Yes [provider]  atorvastatin (LIPITOR) 80 MG tablet TAKE 1 TABLET BY MOUTH EVERY DAY Patient taking differently: Take 80 mg by mouth daily.  06/12/19  Yes Marrian Salvage, FNP  bromocriptine (PARLODEL) 2.5 MG tablet TAKE 1/2 TABLET BY MOUTH EVERY DAY Patient taking differently: Take 2.5 mg by mouth daily.  11/14/19  Yes Renato Shin, MD  calcitRIOL (ROCALTROL) 0.25 MCG capsule Take 0.25 mcg by mouth daily.   Yes [provider]  cloNIDine (CATAPRES) 0.2 MG tablet Take 1 tablet (0.2 mg total) by mouth 3 (three) times daily. 05/21/19  Yes Marrian Salvage, FNP  furosemide (LASIX) 40 MG tablet Take 1 tablet (40 mg total) by mouth See admin instructions. Take 1 tablet daily, can take another pill if ankles are swelling -Hold for the next 3 days and resume on 09/13/2017 Patient taking differently: Take 40 mg by mouth daily as needed for edema.  09/09/17  Yes Elgergawy, Silver Huguenin, MD  metoprolol succinate (TOPROL-XL) 100 MG 24 hr tablet Take 1 tablet (100 mg total) by mouth daily. for high blood pressure 08/20/19  Yes Marrian Salvage, FNP  minoxidil (LONITEN) 2.5 MG tablet Take 2.5 mg by mouth 3 (three) times daily.   Yes [provider]  pantoprazole (PROTONIX) 40 MG tablet Take 1 tablet (40 mg total) by mouth daily. 06/12/19  Yes Marrian Salvage, FNP  polyethylene glycol (MIRALAX / GLYCOLAX) 17 g packet Take 34-51 g by mouth daily as needed for moderate constipation.   Yes [provider]  repaglinide (PRANDIN) 2 MG tablet TAKE 1 TABLET (2 MG TOTAL) BY MOUTH 3 (THREE) TIMES DAILY BEFORE MEALS. 02/22/19  Yes Renato Shin, MD  telmisartan-hydrochlorothiazide (MICARDIS HCT) 80-25 MG tablet TAKE 1 TABLET BY MOUTH EVERY DAY Patient taking differently: Take 1 tablet by mouth daily.  10/03/19  Yes Marrian Salvage, FNP  amLODipine (NORVASC) 10 MG tablet Take 1 tablet (10 mg total) by mouth daily. Patient not taking: Reported on 11/20/2019 03/26/15   [place] Circle, FNP  Blood Glucose Monitoring Suppl (Pillager) w/Device KIT 1 each by Does not apply route 2 (two) times daily. E11.9    [provider]  glucose blood (ONETOUCH VERIO) test strip USE AS INSTRUCTED (TESTS 2 TIMES A DAY) 05/20/19   Marrian Salvage, FNP  OneTouch Delica Lancets 94I MISC 1 each by Does not apply route 2 (two) times daily. E11.9    [provider]  vitamin B-12 (CYANOCOBALAMIN) 100 MCG tablet Take 1 tablet (100 mcg total) by mouth at bedtime. Patient not taking: Reported on 11/05/2019 08/20/19   Marrian Salvage, FNP     Vital Signs: BP [name])  189/68   Pulse 75   Temp 99 F (37.2 C) (Oral)   Resp 19   Ht _0  (1.575 m)   Wt 138 lb (62.6 kg)   SpO2 100%   BMI 25.24 kg/m   Physical Exam Vitals and nursing note reviewed.  Constitutional:      General: She is not in acute distress.    Appearance: Normal appearance.  Cardiovascular:     Rate and Rhythm: Normal rate and regular rhythm.     Heart sounds: Normal heart sounds. No murmur heard.   Pulmonary:      Effort: Pulmonary effort is normal. No respiratory distress.     Breath sounds: Normal breath sounds. No wheezing.  Abdominal:     Comments: Right flank soft with mild tenderness, no erythema, drainage, active bleeding, or hematoma noted.  Skin:    General: Skin is warm and dry.  Neurological:     Mental Status: She is alert and oriented to person, place, and time.     Imaging: CT L-SPINE NO CHARGE  Result Date: 11/28/2019 CLINICAL DATA:  Initial evaluation for right flank pain status post recent right renal biopsy. EXAM: CT LUMBAR SPINE WITHOUT CONTRAST TECHNIQUE: Multidetector CT imaging of the lumbar spine was performed without intravenous contrast administration. Multiplanar CT image reconstructions were also generated. COMPARISON:  Concomitant CT of the abdomen and pelvis from the same day. FINDINGS: Segmentation: Standard. Alignment: Physiologic.  No listhesis. Vertebrae: Vertebral body height maintained without acute or chronic fracture. Visualized sacrum and pelvis intact. SI joints approximated symmetric. Small benign bone island noted within the mid sacrum. No other discrete or worrisome osseous lesions. Paraspinal and other soft tissues: Right pleural effusion partially visualized. Large right perinephric hematoma with stranding/hemorrhage within the right retroperitoneal space, partially visualized, better seen on concomitant CT of the abdomen and pelvis. Moderate aorto bi-iliac atherosclerotic disease. Disc levels: L1-2:  Negative interspace.  Mild facet hypertrophy.  No stenosis. L2-3: Diffuse disc bulge with disc desiccation. Mild reactive endplate change. Mild facet hypertrophy. Resultant mild spinal stenosis. Foramina remain patent. L3-4: Mild diffuse disc bulge. Mild bilateral facet hypertrophy. Probable mild spinal stenosis. Mild bilateral L3 foraminal narrowing. L4-5: Advanced degenerative intervertebral disc space narrowing with ankylosis of the L4 and L5 vertebral bodies, and  obliteration of the L4-5 interspace. Associated circumferential marginal endplate spurring. Moderate facet and ligament flavum hypertrophy. Resultant moderate spinal stenosis. Moderate to severe bilateral L4 foraminal narrowing. L5-S1: Mild disc bulge. Severe bilateral facet degeneration, right worse than left. Resultant mild narrowing of the lateral recesses bilaterally. Foramina remain patent. IMPRESSION: 1. No acute abnormality within the lumbar spine. 2. Large right perinephric hematoma with stranding/hemorrhage within the right retroperitoneal space, partially visualized, better evaluated on concomitant CT of the abdomen and pelvis. 3. 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 Resultant moderate spinal stenosis with moderate to severe bilateral L4 foraminal narrowing. 4. Additional mild noncompressive disc bulging elsewhere within the lumbar spine as above. No other significant stenosis or neural impingement. 5. Right pleural effusion, partially visualized. 6. Aortic Atherosclerosis (ICD10-I70.0). Electronically Signed   By: Jeannine Boga M.D.   On: 11/28/2019 01:26   CT Renal Stone Study  Result Date: 11/28/2019 CLINICAL DATA:  Right flank pain recent right kidney biopsy. EXAM: CT ABDOMEN AND PELVIS WITHOUT CONTRAST TECHNIQUE: Multidetector CT imaging of the abdomen and pelvis was performed following the standard protocol without IV contrast. COMPARISON:  08/14/2017 FINDINGS: Lower chest: Trace right pleural effusion.  Right base atelectasis. Hepatobiliary: No focal hepatic abnormality. Gallbladder unremarkable. Pancreas: No focal abnormality or ductal dilatation. Spleen: No focal abnormality.  Normal size. Adrenals/Urinary Tract: High density large crescentic shaped fluid collection noted around the right kidney compatible with perinephric hematoma, 8.9 x 7.4 cm. This compresses the right kidney. Punctate nonobstructing stone in the upper pole of the right  kidney. No hydronephrosis. Hematoma also appears to extend around the right adrenal gland. Stranding also continues in the right retroperitoneum inferior to the right kidney. Left adrenal gland is unremarkable. Urinary bladder unremarkable. Stomach/Bowel: Stomach, large and small bowel grossly unremarkable. Vascular/Lymphatic: Heavily calcified aorta and iliac vessels. No aneurysm or adenopathy. Reproductive: No mass. Other: No free fluid or free air. Musculoskeletal: No acute bony abnormality. IMPRESSION: Large right perinephric hematoma measuring 8.9 x 7.4 cm. This has mass effect on the right kidney. 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 Stranding also continues around the right adrenal gland and inferior to the right kidney in the right retroperitoneum. Trace right pleural effusion.  No pneumothorax. These results were called by telephone at the time of interpretation on 11/28/2019 at 12:08 am to provider DAN FLOYD , who verbally acknowledged these results. Electronically Signed   By: Rolm Baptise M.D.   On: 11/28/2019 00:12   US BIOPSY (KIDNEY)  Result Date: 11/25/2019 INDICATION: Proteinuria of uncertain etiology. Please perform ultrasound-guided random renal biopsy for tissue diagnostic purposes. EXAM: ULTRASOUND GUIDED RENAL BIOPSY COMPARISON:  CT abdomen and pelvis-08/14/2017 MEDICATIONS: None. ANESTHESIA/SEDATION: Fentanyl 100 mcg IV; Versed 2 mg IV Total Moderate Sedation time: 10 minutes; The patient was continuously monitored during the procedure by the interventional radiology nurse under my direct supervision. COMPLICATIONS: None immediate. PROCEDURE: Informed written consent was obtained from the patient after a discussion of the risks, benefits and alternatives to treatment. The patient understands and consents the procedure. A timeout was performed prior to the initiation of the procedure. Ultrasound scanning was performed of the bilateral flanks. The inferior  pole of the right kidney was selected for biopsy due to location and sonographic window. The procedure was planned. The operative site was prepped and draped in the usual sterile fashion. The overlying soft tissues were anesthetized with 1% lidocaine with epinephrine. A 17 gauge core needle biopsy device was advanced into the inferior cortex of the right kidney and 2 core biopsies were obtained under direct ultrasound guidance. Images were saved for documentation purposes. The biopsy device was removed and hemostasis was obtained with manual compression. Post procedural scanning was negative for significant post procedural hemorrhage or additional complication. A dressing was placed. The patient tolerated the procedure well without immediate post procedural complication. IMPRESSION: Technically successful ultrasound guided right renal biopsy. Electronically Signed   By: Sandi Mariscal M.D.   On: 11/25/2019 08:54    Labs:  CBC: Recent Labs    11/11/19 0620 11/11/19 0620 11/25/19 0556 11/27/19 2136 11/28/19 0500 11/28/19 1041  WBC 5.8  --  5.3 7.1 9.7  --   HGB 10.3*   < > 9.7* 9.1* 7.1* 7.2*  HCT 32.7*   < > 29.7* 27.6* 22.3* 22.2*  PLT 285  --  226 223 190  --    < > = values in this interval not displayed.    COAGS: Recent Labs    11/11/19 0620 11/25/19 0556  INR 1.0 1.0    BMP: Recent Labs    09/24/19 1044 11/07/19 1011 11/27/19 2136 11/28/19 0500  NA 140 137 133* 135  K 3.5 3.4* 3.7 4.5  CL 100 104 101 102  CO2 _0 GLUCOSE 64* 145* 262* 323*  BUN 46* 43* 67* 65*  CALCIUM 9.3 8.4*  8.3* 8.4* 8.0*  CREATININE 2.09* 2.70* 3.08* 3.00*  GFRNONAA  --  17* 16* 16*    LIVER FUNCTION TESTS: Recent Labs    09/24/19 1044 11/07/19 1011 11/28/19 0500  BILITOT 1.1  --  0.7  AST 22  --  27  ALT 19  --  29  ALKPHOS  --   --  87  PROT 7.7  --  6.4*  ALBUMIN  --  2.4* 2.8*    Assessment and Plan:  History of proteinuria s/p right random renal biopsy in IR  60/06/43; complicated by development of right flank pain and hypertension secondary to right perinephric hematoma. Discussed case with Dr. Laurence Ferrari (IR)- no indications for percutaneous drainage at this time. Below are IR recommendations: 1- Hypertension management. If cannot be controlled medically and concern for Page kidney recommend urology consult. 2- Pain control as per primary team. 3- Trend hgb and transfuse as indicated. Currently stable at 7.2 (was 7.1 this AM).  Further plans per [facility]/nephrology- appreciate and agree with management. IR to follow.   Electronically Signed: Earley Abide, PA-C 11/28/2019, 11:13 AM   I spent a total of 25 Minutes at the the patient's bedside AND on the patient's hospital floor or unit, greater than 50% of which was counseling/coordinating care for right perinephric hematoma.

## 2019-11-28 NOTE — Progress Notes (Signed)
Patient ID: [name], female   DOB: 1950-02-08, 69 y.o.   MRN: [id number] Interventional Radiology was contacted about a post renal biopsy hematoma and hypertension. US guided renal biopsy performed on 11/25/2019. CT report raised concern about a Page kidney related to the right renal hematoma.  I have reviewed the images and recent notes from the renal biopsy.  It appears that blood pressure control has been an issue for this patient. The right renal hematoma does not look amendable to percutaneous drainage based on the high density of the hematoma.  Discussed with ED provider and recommend medical management of the hypertension at this time and follow hemoglobin level. If the blood pressure cannot be controlled medically and we think the hematoma is causing a Page kidney, then will likely need Urology consultation.

## 2019-11-28 NOTE — H&P (Signed)
History and Physical    [name] ZHY:[id number] DOB: 1950/08/21 DOA: 11/27/2019  PCP: Marrian Salvage, FNP (Confirm with patient/family/NH records and if not entered, this has to be entered at [facility] point of entry) Patient coming from: home  I have personally briefly reviewed patient's old medical records in [place]  Chief Complaint: Right flank pain  HPI: [name] is a 69 y.o. female with medical history significant of hypertension, hypothyroidism, diabetes mellitus type 2, diverticulosis and tobacco dependence presented to ED for severe right flank pain.  Patient had a kidney biopsy 2 days ago and started having right flank pain after biopsy.  Patient denies any dysuria, burning micturition and urinary retention.  Patient also denies fever, chills, chest pain, shortness of breath, nausea, vomiting, diarrhea and constipation.  (For level 3, the HPI must include 4+ descriptors: Location, Quality, Severity, Duration, Timing, Context, modifying factors, associated signs/symptoms and/or status of 3+ chronic problems.)  (Please avoid self-populating past medical history here) (The initial 2-3 lines should be focused and good to copy and paste in the HPI section of the daily progress note).  ED Course: On arrival to ED patient had blood pressure of 220/87, heart rate 60, temperature 97.7 and oxygen saturation 98% on room air.  Lab work showed WBC 7.1, hemoglobin 9.1, sodium 133, potassium 3.7, BUN 67, creatinine 3.08, blood glucose 262.  Can of abdominal/pelvis done which showed a large perinephric hematoma with some compression of kidney.  ED physician discussed the case with nephrology and he recommended that interventional radiology should be consulted to place a drain.  ED physician: Contacted interventional radiologist who recommended medical management at this time and will consider drain placement if medical management fails.  In ED patient was managed with IV fluids,  Zofran, morphine and Vasotec.  Review of Systems: As per HPI otherwise 10 point review of systems negative.  Unacceptable ROS statements: "10 systems reviewed," "Extensive" (without elaboration).  Acceptable ROS statements: "All others negative," "All others reviewed and are negative," and "All others unremarkable," with at [place] documented Can't double dip - if using for HPI can't use for ROS  Past Medical History:  Diagnosis Date  . Acute kidney injury ([place]) 01/08/2015  . Arthritis   . Chronic back pain   . Constipation   . Cough   . Diabetes mellitus, type 2 ([place])   . Diverticulosis   . Fibroid    patient thinks this was the reason for her hysterectomy  . GERD (gastroesophageal reflux disease)   . Heart murmur   . History of sebaceous cyst   . Hyperlipemia   . Hyperplastic colon polyp   . Hypertension   . Hyperthyroidism   . Lumbar radiculopathy   . Shortness of breath 09/13/2013  . Stroke ([place]) 2015?   x4   . Tobacco abuse   . 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 ABDOMINAL HYSTERECTOMY     --?ovaries remain  . COLONOSCOPY W/ BIOPSIES  09/11/2008  . RETINOPATHY SURGERY Bilateral 2013   Dr. Zigmund Daniel     reports that she quit smoking about 5 years ago. Her smoking use included cigarettes. She has a 17.50 pack-year smoking history. She has never used smokeless tobacco. She reports that she does not drink alcohol and does not use drugs.  No Known Allergies  Family History  Problem Relation Age of Onset  .  Hypertension Mother   . Sleep apnea Mother   . Diabetes Mother   . Hyperlipidemia Mother   . Lung cancer Father        smoker  . Hypertension Sister   . Diabetes Sister   . Hypertension Brother   . Hypertension Sister   . Diabetes Brother   . Hypertension Brother   . Breast cancer Neg Hx   . Colon cancer Neg Hx   . Esophageal cancer Neg Hx   . Pancreatic  cancer Neg Hx   . Liver disease Neg Hx   . Colon polyps Neg Hx     Unacceptable: Noncontributory, unremarkable, or negative. Acceptable: (example)Family history negative for heart disease  Prior to Admission medications   Medication Sig Start Date End Date Taking? Authorizing Provider  aspirin 325 MG tablet Take 325 mg by mouth daily.   Yes [provider]  atorvastatin (LIPITOR) 80 MG tablet TAKE 1 TABLET BY MOUTH EVERY DAY Patient taking differently: Take 80 mg by mouth daily.  06/12/19  Yes Marrian Salvage, FNP  bromocriptine (PARLODEL) 2.5 MG tablet TAKE 1/2 TABLET BY MOUTH EVERY DAY Patient taking differently: Take 2.5 mg by mouth daily.  11/14/19  Yes Renato Shin, MD  calcitRIOL (ROCALTROL) 0.25 MCG capsule Take 0.25 mcg by mouth daily.   Yes [provider]  cloNIDine (CATAPRES) 0.2 MG tablet Take 1 tablet (0.2 mg total) by mouth 3 (three) times daily. 05/21/19  Yes Marrian Salvage, FNP  furosemide (LASIX) 40 MG tablet Take 1 tablet (40 mg total) by mouth See admin instructions. Take 1 tablet daily, can take another pill if ankles are swelling -Hold for the next 3 days and resume on 09/13/2017 Patient taking differently: Take 40 mg by mouth daily as needed for edema.  09/09/17  Yes Elgergawy, Silver Huguenin, MD  metoprolol succinate (TOPROL-XL) 100 MG 24 hr tablet Take 1 tablet (100 mg total) by mouth daily. for high blood pressure 08/20/19  Yes Marrian Salvage, FNP  minoxidil (LONITEN) 2.5 MG tablet Take 2.5 mg by mouth 3 (three) times daily.   Yes [provider]  pantoprazole (PROTONIX) 40 MG tablet Take 1 tablet (40 mg total) by mouth daily. 06/12/19  Yes Marrian Salvage, FNP  polyethylene glycol (MIRALAX / GLYCOLAX) 17 g packet Take 34-51 g by mouth daily as needed for moderate constipation.   Yes [provider]  repaglinide (PRANDIN) 2 MG tablet TAKE 1 TABLET (2 MG TOTAL) BY MOUTH 3 (THREE) TIMES DAILY BEFORE MEALS. 02/22/19   Yes Renato Shin, MD  telmisartan-hydrochlorothiazide (MICARDIS HCT) 80-25 MG tablet TAKE 1 TABLET BY MOUTH EVERY DAY Patient taking differently: Take 1 tablet by mouth daily.  10/03/19  Yes Marrian Salvage, FNP  amLODipine (NORVASC) 10 MG tablet Take 1 tablet (10 mg total) by mouth daily. Patient not taking: Reported on 11/20/2019 03/26/15   [place], FNP  Blood Glucose Monitoring Suppl ([place]) w/Device KIT 1 each by Does not apply route 2 (two) times daily. E11.9    [provider]  glucose blood (ONETOUCH VERIO) test strip USE AS INSTRUCTED (TESTS 2 TIMES A DAY) 05/20/19   Marrian Salvage, FNP  OneTouch Delica Lancets 32G MISC 1 each by Does not apply route 2 (two) times daily. E11.9    [provider]  vitamin B-12 (CYANOCOBALAMIN) 100 MCG tablet Take 1 tablet (100 mcg total) by mouth at bedtime. Patient not taking: Reported on 11/05/2019 08/20/19  Marrian Salvage, FNP    Physical Exam: Vitals:   11/28/19 0500 11/28/19 0530 11/28/19 0600 11/28/19 0630  BP: (!) 216/75 (!) 218/75 (!) 224/79 (!) 224/77  Pulse: 66 65 72 69  Resp: _0 Temp:      TempSrc:      SpO2: 97% 96% 98% 96%  Weight:      Height:        Constitutional: NAD, calm, comfortable Vitals:   11/28/19 0500 11/28/19 0530 11/28/19 0600 11/28/19 0630  BP: (!) 216/75 (!) 218/75 (!) 224/79 (!) 224/77  Pulse: 66 65 72 69  Resp: _1 Temp:      TempSrc:      SpO2: 97% 96% 98% 96%  Weight:      Height:       Eyes: PERRL, lids and conjunctivae normal ENMT: Mucous membranes are moist. Posterior pharynx clear of any exudate or lesions.Normal dentition.  Neck: normal, supple, no masses, no thyromegaly Respiratory: clear to auscultation bilaterally, no wheezing, no crackles. Normal respiratory effort. No accessory muscle use.  Cardiovascular: Regular rate and rhythm, no murmurs / rubs / gallops. No extremity edema. 2+ pedal pulses. No carotid  bruits.  Abdomen: no tenderness, no masses palpated. No hepatosplenomegaly. Bowel sounds positive.  Severe tenderness in right flank pain Musculoskeletal: no clubbing / cyanosis. No joint deformity upper and lower extremities. Good ROM, no contractures. Normal muscle tone.  Skin: no rashes, lesions, ulcers. No induration Neurologic: CN 2-12 grossly intact. Sensation intact, DTR normal. Strength 5/5 in all 4.  Psychiatric: Normal judgment and insight. Alert and oriented x 3. Normal mood.   (Anything < 9 systems with 2 bullets each down codes to level 1) (If patient refuses exam can't bill higher level) (Make sure to document decubitus ulcers present on admission -- if possible -- and whether patient has chronic indwelling catheter at time of admission)  Labs on Admission: I have personally reviewed following labs and imaging studies  CBC: Recent Labs  Lab 11/25/19 0556 11/27/19 2136 11/28/19 0500  WBC 5.3 7.1 9.7  HGB 9.7* 9.1* 7.1*  HCT 29.7* 27.6* 22.3*  MCV 97.1 98.2 99.1  PLT 226 223 952   Basic Metabolic Panel: Recent Labs  Lab 11/27/19 2136 11/28/19 0500  NA 133* 135  K 3.7 4.5  CL 101 102  CO2 24 22  GLUCOSE 262* 323*  BUN 67* 65*  CREATININE 3.08* 3.00*  CALCIUM 8.4* 8.0*   GFR: Estimated Creatinine Clearance: 15.4 mL/min (A) (by C-G formula based on SCr of 3 mg/dL (H)). Liver Function Tests: Recent Labs  Lab 11/28/19 0500  AST 27  ALT 29  ALKPHOS 87  BILITOT 0.7  PROT 6.4*  ALBUMIN 2.8*   No results for input(s): LIPASE, AMYLASE in the last 168 hours. No results for input(s): AMMONIA in the last 168 hours. Coagulation Profile: Recent Labs  Lab 11/25/19 0556  INR 1.0   Cardiac Enzymes: No results for input(s): CKTOTAL, CKMB, CKMBINDEX, TROPONINI in the last 168 hours. BNP (last 3 results) No results for input(s): PROBNP in the last 8760 hours. HbA1C: No results for input(s): HGBA1C in the last 72 hours. CBG: Recent Labs  Lab 11/25/19 0619    GLUCAP 191*   Lipid Profile: No results for input(s): CHOL, HDL, LDLCALC, TRIG, CHOLHDL, LDLDIRECT in the last 72 hours. Thyroid Function Tests: No results for input(s): TSH, T4TOTAL, FREET4, T3FREE, THYROIDAB in the last 72 hours. Anemia Panel: No results for  input(s): VITAMINB12, FOLATE, FERRITIN, TIBC, IRON, RETICCTPCT in the last 72 hours. Urine analysis:    Component Value Date/Time   COLORURINE YELLOW 11/27/2019 2136   APPEARANCEUR HAZY (A) 11/27/2019 2136   LABSPEC 1.011 11/27/2019 2136   PHURINE 6.0 11/27/2019 2136   GLUCOSEU 150 (A) 11/27/2019 2136   GLUCOSEU NEGATIVE 09/04/2009 0721   HGBUR SMALL (A) 11/27/2019 2136   BILIRUBINUR NEGATIVE 11/27/2019 2136   BILIRUBINUR negative 09/24/2019 1003   KETONESUR NEGATIVE 11/27/2019 2136   PROTEINUR >=300 (A) 11/27/2019 2136   UROBILINOGEN 0.2 09/24/2019 1003   UROBILINOGEN 0.2 09/04/2009 0721   NITRITE NEGATIVE 11/27/2019 2136   LEUKOCYTESUR NEGATIVE 11/27/2019 2136    Radiological Exams on Admission: CT L-SPINE NO CHARGE  Result Date: 11/28/2019 CLINICAL DATA:  Initial evaluation for right flank pain status post recent right renal biopsy. EXAM: CT LUMBAR SPINE WITHOUT CONTRAST TECHNIQUE: Multidetector CT imaging of the lumbar spine was performed without intravenous contrast administration. Multiplanar CT image reconstructions were also generated. COMPARISON:  Concomitant CT of the abdomen and pelvis from the same day. FINDINGS: Segmentation: Standard. Alignment: Physiologic.  No listhesis. Vertebrae: Vertebral body height maintained without acute or chronic fracture. Visualized sacrum and pelvis intact. SI joints approximated symmetric. Small benign bone island noted within the mid sacrum. No other discrete or worrisome osseous lesions. Paraspinal and other soft tissues: Right pleural effusion partially visualized. Large right perinephric hematoma with stranding/hemorrhage within the right retroperitoneal space, partially  visualized, better seen on concomitant CT of the abdomen and pelvis. Moderate aorto bi-iliac atherosclerotic disease. Disc levels: L1-2:  Negative interspace.  Mild facet hypertrophy.  No stenosis. L2-3: Diffuse disc bulge with disc desiccation. Mild reactive endplate change. Mild facet hypertrophy. Resultant mild spinal stenosis. Foramina remain patent. L3-4: Mild diffuse disc bulge. Mild bilateral facet hypertrophy. Probable mild spinal stenosis. Mild bilateral L3 foraminal narrowing. L4-5: Advanced degenerative intervertebral disc space narrowing with ankylosis of the L4 and L5 vertebral bodies, and obliteration of the L4-5 interspace. Associated circumferential marginal endplate spurring. Moderate facet and ligament flavum hypertrophy. Resultant moderate spinal stenosis. Moderate to severe bilateral L4 foraminal narrowing. L5-S1: Mild disc bulge. Severe bilateral facet degeneration, right worse than left. Resultant mild narrowing of the lateral recesses bilaterally. Foramina remain patent. IMPRESSION: 1. No acute abnormality within the lumbar spine. 2. Large right perinephric hematoma with stranding/hemorrhage within the right retroperitoneal space, partially visualized, better evaluated on concomitant CT of the abdomen and pelvis. 3. 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 Resultant moderate spinal stenosis with moderate to severe bilateral L4 foraminal narrowing. 4. Additional mild noncompressive disc bulging elsewhere within the lumbar spine as above. No other significant stenosis or neural impingement. 5. Right pleural effusion, partially visualized. 6. Aortic Atherosclerosis (ICD10-I70.0). Electronically Signed   By: Jeannine Boga M.D.   On: 11/28/2019 01:26   CT Renal Stone Study  Result Date: 11/28/2019 CLINICAL DATA:  Right flank pain recent right kidney biopsy. EXAM: CT ABDOMEN AND PELVIS WITHOUT CONTRAST TECHNIQUE: Multidetector CT imaging of the  abdomen and pelvis was performed following the standard protocol without IV contrast. COMPARISON:  08/14/2017 FINDINGS: Lower chest: Trace right pleural effusion.  Right base atelectasis. Hepatobiliary: No focal hepatic abnormality. Gallbladder unremarkable. Pancreas: No focal abnormality or ductal dilatation. Spleen: No focal abnormality.  Normal size. Adrenals/Urinary Tract: High density large crescentic shaped fluid collection noted around the right kidney compatible with perinephric hematoma, 8.9 x 7.4 cm. This compresses the right kidney. Punctate nonobstructing stone  in the upper pole of the right kidney. No hydronephrosis. Hematoma also appears to extend around the right adrenal gland. Stranding also continues in the right retroperitoneum inferior to the right kidney. Left adrenal gland is unremarkable. Urinary bladder unremarkable. Stomach/Bowel: Stomach, large and small bowel grossly unremarkable. Vascular/Lymphatic: Heavily calcified aorta and iliac vessels. No aneurysm or adenopathy. Reproductive: No mass. Other: No free fluid or free air. Musculoskeletal: No acute bony abnormality. IMPRESSION: Large right perinephric hematoma measuring 8.9 x 7.4 cm. This has mass effect on the right kidney. 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 Stranding also continues around the right adrenal gland and inferior to the right kidney in the right retroperitoneum. Trace right pleural effusion.  No pneumothorax. These results were called by telephone at the time of interpretation on 11/28/2019 at 12:08 am to provider DAN FLOYD , who verbally acknowledged these results. Electronically Signed   By: Rolm Baptise M.D.   On: 11/28/2019 00:12    EKG:   Assessment/Plan Principal Problem:   Right flank discomfort. CT scan showed large perinephric hematoma with some compression of kidney there is most probably the cause of right flank discomfort.  Pain medications as needed according to the  pain scale ordered.  Nephrology and interventional radiology consulted and will appreciate their recommendations.  Active Problems: Acute kidney injury  Continue to monitor BMP.  Continue gentle hydration with IV normal saline..  Elevated blood pressure Elevated blood pressure is most likely secondary to compression of kidney secondary to perinephric hematoma.  Continue home medications.  IV hydralazine as needed if systolic blood pressure above 829 and diastolic above 562.    Hyperlipidemia Continue home statin    Type 2 diabetes mellitus with hyperglycemia (HCC) Sliding scale insulin ordered.  Blood glucose monitoring and hypoglycemic protocol in place.  (please populate well all problems here in Problem List. (For example, if patient is on BP meds at home and you resume or decide to hold them, it is a problem that needs to be her. Same for CAD, COPD, HLD and so on)    DVT prophylaxis: Heparin  Code Status: Full code Family Communication: No family member at bedside Disposition Plan:  Consults called: Nephrology, interventional radiology Admission status: Inpatient/telemetry   Edmonia Lynch MD [facility] Pager [phone]   If 7PM-7AM, please contact night-coverage [email or web address] Password [facility]  11/28/2019, 6:49 AM

## 2019-11-28 NOTE — Consult Note (Addendum)
Renal Service Consult Note [facility]  TONIESHA ZELLNER 11/28/2019 Sol Blazing, MD Requesting Physician: Dr Starla Link  Reason for Consult: Christin Bach HTN, subcapsular hematoma sp renal bx HPI: The patient is a 69 y.o. year-old w/ hx of DM2, HTN, CKD IV, prior CVA, tobacco use presented to ED yesterday evening for R flank pain after kidney biopsy on 11/01. In ED CT scan showed "perinephric hematoma" on the R, BP's were very high 220/88 range. Creat was 3.0, most recent 2.7, 2.0 in August. IR consulted and felt that perc drainage of the hematoma was not indicated due to high density of the fluid collection, they recommended medical Rx and if not improving consult urology. Pt was admitted. She got one dose IV enalapril 2.5 one time at 2 am today, and then later this am 8-10 am got most of her home meds (clonidine/ toprol xl/ minoxidil) except didn't get the ARB, hctz or norvasc. Creat down to 2.7 this am.  Asked to see for renal failure and uncont HTN.    Pt doesn't feel great, had some nausea this am.  No SOB or cough, R flank pain better right now.  Doesn't know name of her kidney doctor (was Dr Lorrene Reid but she retired recently).   ROS  denies CP  no joint pain   no HA  no blurry vision  no rash  no diarrhea  no nausea/ vomiting  no dysuria  no difficulty voiding  no change in urine color    Past Medical History  Past Medical History:  Diagnosis Date  . Acute kidney injury ([place]) 01/08/2015  . Arthritis   . Chronic back pain   . Constipation   . Cough   . Diabetes mellitus, type 2 ([place])   . Diverticulosis   . Fibroid    patient thinks this was the reason for her hysterectomy  . GERD (gastroesophageal reflux disease)   . Heart murmur   . History of sebaceous cyst   . Hyperlipemia   . Hyperplastic colon polyp   . Hypertension   . Hyperthyroidism   . Lumbar radiculopathy   . Shortness of breath 09/13/2013  . Stroke ([place]) 2015?   x4   . Tobacco abuse   .  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 ABDOMINAL HYSTERECTOMY     --?ovaries remain  . COLONOSCOPY W/ BIOPSIES  09/11/2008  . RETINOPATHY SURGERY Bilateral 2013   Dr. Zigmund Daniel   Family History  Family History  Problem Relation Age of Onset  . Hypertension Mother   . Sleep apnea Mother   . Diabetes Mother   . Hyperlipidemia Mother   . Lung cancer Father        smoker  . Hypertension Sister   . Diabetes Sister   . Hypertension Brother   . Hypertension Sister   . Diabetes Brother   . Hypertension Brother   . Breast cancer Neg Hx   . Colon cancer Neg Hx   . Esophageal cancer Neg Hx   . Pancreatic cancer Neg Hx   . Liver disease Neg Hx   . Colon polyps Neg Hx    Social History  reports that she quit smoking about 5 years ago. Her smoking use included cigarettes. She has a 17.50 pack-year smoking history. She has never used smokeless tobacco. She reports that she does not drink alcohol and does not  use drugs. Allergies No Known Allergies Home medications Prior to Admission medications   Medication Sig Start Date End Date Taking? Authorizing Provider  aspirin 325 MG tablet Take 325 mg by mouth daily.   Yes [provider]  atorvastatin (LIPITOR) 80 MG tablet TAKE 1 TABLET BY MOUTH EVERY DAY Patient taking differently: Take 80 mg by mouth daily.  06/12/19  Yes Marrian Salvage, FNP  bromocriptine (PARLODEL) 2.5 MG tablet TAKE 1/2 TABLET BY MOUTH EVERY DAY Patient taking differently: Take 2.5 mg by mouth daily.  11/14/19  Yes Renato Shin, MD  calcitRIOL (ROCALTROL) 0.25 MCG capsule Take 0.25 mcg by mouth daily.   Yes [provider]  cloNIDine (CATAPRES) 0.2 MG tablet Take 1 tablet (0.2 mg total) by mouth 3 (three) times daily. 05/21/19  Yes Marrian Salvage, FNP  furosemide (LASIX) 40 MG tablet Take 1 tablet (40 mg total) by mouth See admin  instructions. Take 1 tablet daily, can take another pill if ankles are swelling -Hold for the next 3 days and resume on 09/13/2017 Patient taking differently: Take 40 mg by mouth daily as needed for edema.  09/09/17  Yes Elgergawy, Silver Huguenin, MD  metoprolol succinate (TOPROL-XL) 100 MG 24 hr tablet Take 1 tablet (100 mg total) by mouth daily. for high blood pressure 08/20/19  Yes Marrian Salvage, FNP  minoxidil (LONITEN) 2.5 MG tablet Take 2.5 mg by mouth 3 (three) times daily.   Yes [provider]  pantoprazole (PROTONIX) 40 MG tablet Take 1 tablet (40 mg total) by mouth daily. 06/12/19  Yes Marrian Salvage, FNP  polyethylene glycol (MIRALAX / GLYCOLAX) 17 g packet Take 34-51 g by mouth daily as needed for moderate constipation.   Yes [provider]  repaglinide (PRANDIN) 2 MG tablet TAKE 1 TABLET (2 MG TOTAL) BY MOUTH 3 (THREE) TIMES DAILY BEFORE MEALS. 02/22/19  Yes Renato Shin, MD  telmisartan-hydrochlorothiazide (MICARDIS HCT) 80-25 MG tablet TAKE 1 TABLET BY MOUTH EVERY DAY Patient taking differently: Take 1 tablet by mouth daily.  10/03/19  Yes Marrian Salvage, FNP  amLODipine (NORVASC) 10 MG tablet Take 1 tablet (10 mg total) by mouth daily. Patient not taking: Reported on 11/20/2019 03/26/15   [place], FNP  Blood Glucose Monitoring Suppl ([place]) w/Device KIT 1 each by Does not apply route 2 (two) times daily. E11.9    [provider]  glucose blood (ONETOUCH VERIO) test strip USE AS INSTRUCTED (TESTS 2 TIMES A DAY) 05/20/19   Marrian Salvage, FNP  OneTouch Delica Lancets 01K MISC 1 each by Does not apply route 2 (two) times daily. E11.9    [provider]  vitamin B-12 (CYANOCOBALAMIN) 100 MCG tablet Take 1 tablet (100 mcg total) by mouth at bedtime. Patient not taking: Reported on 11/05/2019 08/20/19   Marrian Salvage, FNP     Vitals:   11/28/19 0725 11/28/19 0730 11/28/19 0835 11/28/19 1011   BP: (!) 207/71 (!) 215/73 (!) 197/74 (!) 189/68  Pulse: 73 72 73 75  Resp:  _0 Temp:    99 F (37.2 C)  TempSrc:    Oral  SpO2: 98% 98% 97% 100%  Weight:      Height:       Exam Gen alert, wdwn, no distress , calm No rash, cyanosis or gangrene Sclera anicteric, throat clear  +JVD Chest mild rales bilat bases RRR 2/6 sem , noRG Abd soft ntnd no mass or ascites +  bs, no CVAT GU  defer MS no joint effusions or deformity Ext + mild bilat pretib pitting edema Neuro is alert, Ox 3 , nf    Date  Creat  eGFR ml/min  2008-2014 0.7- 0.8  2015- 16 1.31- 2.24  2017- 18 1.18- 1.88  2019- 2020 2.03- 2.70 20- 31, CKD IV  aug 2021 2.09  11/3  3.08  11/4  3.00, 2.76   Home meds:  - norvasc 10/ clonidine 0.2 tid/ metoprolol xl 100 qd/ minoxidil 2.5 tid/ telmisartan-hctz 80-25 qd/ lasix 40 mg qd prn  - repaglinide 48m tid/ bromocriptine 2.5 qd' protonix qd  - asa 325/ lipitor 80  - prn's/ vitamins/ supplements    UA 11/3 - >300 protein, 21-50 rbc, 0-5 wbc, rare bact    CT 11/3 - Adrenals/Urinary Tract: High density large crescentic shaped fluid collection noted around right kidney compatible with perinephric hematoma, 8.9 x 7.4 cm. This compresses the right kidney. Punctate nonobstructing stone in the upper pole of the right kidney. No hydronephrosis. Hematoma also appears to extend around the right adrenal gland. Stranding also continues in the right retroperitoneum inferior to the right kidney. Left adrenal gland is unremarkable. Urinary bladder unremarkable.  Assessment/ Plan: 1. 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 Would cont her current home meds (clon/ metop/ minoxidil) and will resume IV enalapril which should directly address ^^ renin/ Ag II state. Will also give IV lasix moderate dosing as pt is slightly vol  overloaded w/ LE edema. Dc'd IVF's.  Can add back norvasc if BP's not better soon.  2. AoCKD 4 - b/l creat 2.0- 2.7, eGFR 20- 31 ml/min from 2019- 8/21. 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 Needs ACE inhibition acutely for #1 as above.  3. IDDM - per primary      RKelly Splinter MD 11/28/2019, 12:06 PM  Recent Labs  Lab 11/27/19 2136 11/27/19 2136 11/28/19 0500 11/28/19 1041  WBC 7.1  --  9.7  --   HGB 9.1*   < > 7.1* 7.2*   < > = values in this interval not displayed.   Recent Labs  Lab 11/28/19 0500 11/28/19 1041  K 4.5 4.4  BUN 65* 66*  CREATININE 3.00* 2.76*  CALCIUM 8.0* 8.1*

## 2019-11-28 NOTE — ED Notes (Signed)
Report called to [facility] RN Pt to floor via stretcher.

## 2019-11-28 NOTE — Progress Notes (Signed)
Patient ID: [name], female   DOB: 06/16/1950, 69 y.o.   MRN: [id number] Patient admitted early this morning for right flank pain and was found to have large perinephric hematoma for which IR was consulted.  Blood pressure also extremely elevated.  Patient seen and examined at bedside and plan of care discussed with her.  I have reviewed recent medical records including this morning's HPI, vitals, labs and medications myself.  Will check hemoglobin and BMP again.  I have consulted nephrology.  If hemoglobin continues to drop, might need urology evaluation as per IR recommendations.  DC hydrochlorothiazide and ARB.

## 2019-11-29 ENCOUNTER — Inpatient Hospital Stay (HOSPITAL_COMMUNITY): Payer: Medicare Other

## 2019-11-29 DIAGNOSIS — D62 Acute posthemorrhagic anemia: Secondary | ICD-10-CM

## 2019-11-29 DIAGNOSIS — N184 Chronic kidney disease, stage 4 (severe): Secondary | ICD-10-CM

## 2019-11-29 DIAGNOSIS — S37019A Minor contusion of unspecified kidney, initial encounter: Secondary | ICD-10-CM

## 2019-11-29 DIAGNOSIS — I10 Essential (primary) hypertension: Secondary | ICD-10-CM | POA: Diagnosis not present

## 2019-11-29 DIAGNOSIS — E1165 Type 2 diabetes mellitus with hyperglycemia: Secondary | ICD-10-CM

## 2019-11-29 DIAGNOSIS — N179 Acute kidney failure, unspecified: Secondary | ICD-10-CM | POA: Diagnosis not present

## 2019-11-29 LAB — BASIC METABOLIC PANEL
Anion gap: 11 (ref 5–15)
BUN: 72 mg/dL — ABNORMAL HIGH (ref 8–23)
CO2: 23 mmol/L (ref 22–32)
Calcium: 7.8 mg/dL — ABNORMAL LOW (ref 8.9–10.3)
Chloride: 100 mmol/L (ref 98–111)
Creatinine, Ser: 3.49 mg/dL — ABNORMAL HIGH (ref 0.44–1.00)
GFR, Estimated: 14 mL/min — ABNORMAL LOW (ref >60)
Glucose, Bld: 179 mg/dL — ABNORMAL HIGH (ref 70–99)
Potassium: 4.6 mmol/L (ref 3.5–5.1)
Sodium: 134 mmol/L — ABNORMAL LOW (ref 135–145)

## 2019-11-29 LAB — CBC WITH DIFFERENTIAL/PLATELET
Abs Immature Granulocytes: 0.04 10*3/uL (ref 0.00–0.07)
Basophils Absolute: 0 10*3/uL (ref 0.0–0.1)
Basophils Relative: 0 %
Eosinophils Absolute: 0 10*3/uL (ref 0.0–0.5)
Eosinophils Relative: 0 %
HCT: 15.8 % — ABNORMAL LOW (ref 36.0–46.0)
Hemoglobin: 5.1 g/dL — CL (ref 12.0–15.0)
Immature Granulocytes: 1 %
Lymphocytes Relative: 17 %
Lymphs Abs: 1.5 10*3/uL (ref 0.7–4.0)
MCH: 31.7 pg (ref 26.0–34.0)
MCHC: 32.3 g/dL (ref 30.0–36.0)
MCV: 98.1 fL (ref 80.0–100.0)
Monocytes Absolute: 0.9 10*3/uL (ref 0.1–1.0)
Monocytes Relative: 10 %
Neutro Abs: 6.2 10*3/uL (ref 1.7–7.7)
Neutrophils Relative %: 72 %
Platelets: 160 10*3/uL (ref 150–400)
RBC: 1.61 MIL/uL — ABNORMAL LOW (ref 3.87–5.11)
RDW: 15 % (ref 11.5–15.5)
WBC: 8.6 10*3/uL (ref 4.0–10.5)
nRBC: 0 % (ref 0.0–0.2)

## 2019-11-29 LAB — CBC
HCT: 22.3 % — ABNORMAL LOW (ref 36.0–46.0)
Hemoglobin: 7.8 g/dL — ABNORMAL LOW (ref 12.0–15.0)
MCH: 33.9 pg (ref 26.0–34.0)
MCHC: 35 g/dL (ref 30.0–36.0)
MCV: 97 fL (ref 80.0–100.0)
Platelets: 135 10*3/uL — ABNORMAL LOW (ref 150–400)
RBC: 2.3 MIL/uL — ABNORMAL LOW (ref 3.87–5.11)
RDW: 15.9 % — ABNORMAL HIGH (ref 11.5–15.5)
WBC: 9.9 10*3/uL (ref 4.0–10.5)
nRBC: 0 % (ref 0.0–0.2)

## 2019-11-29 LAB — ABO/RH: ABO/RH(D): A POS

## 2019-11-29 LAB — GLUCOSE, CAPILLARY
Glucose-Capillary: 153 mg/dL — ABNORMAL HIGH (ref 70–99)
Glucose-Capillary: 120 mg/dL — ABNORMAL HIGH (ref 70–99)
Glucose-Capillary: 125 mg/dL — ABNORMAL HIGH (ref 70–99)
Glucose-Capillary: 147 mg/dL — ABNORMAL HIGH (ref 70–99)

## 2019-11-29 LAB — HEMOGLOBIN A1C
Hgb A1c MFr Bld: 8 % — ABNORMAL HIGH (ref 4.8–5.6)
Mean Plasma Glucose: 182.9 mg/dL

## 2019-11-29 LAB — PREPARE RBC (CROSSMATCH)

## 2019-11-29 LAB — MAGNESIUM: Magnesium: 2.4 mg/dL (ref 1.7–2.4)

## 2019-11-29 MED ORDER — ENALAPRILAT 1.25 MG/ML IV SOLN
0.6250 mg | Freq: Four times a day (QID) | INTRAVENOUS | Status: DC | PRN
  Filled 2019-11-29: qty 0.5

## 2019-11-29 MED ORDER — METOPROLOL TARTRATE 50 MG PO TABS
50.0000 mg | ORAL_TABLET | Freq: Two times a day (BID) | ORAL | Status: DC
  Administered 2019-11-29 – 2019-12-03 (×8): 50 mg via ORAL
  Filled 2019-11-29 (×8): qty 1

## 2019-11-29 MED ORDER — LABETALOL HCL 5 MG/ML IV SOLN: 10.0000 mg | INTRAVENOUS | Status: DC | PRN

## 2019-11-29 MED ORDER — SODIUM CHLORIDE 0.9% IV SOLUTION: Freq: Once | INTRAVENOUS | Status: AC

## 2019-11-29 NOTE — Consult Note (Signed)
Consult: Right renal hematoma management of bleeding  Requested by: Dr. Aline August  Pt seen and examined, full consult to follow. US guided renal biopsy performed on 11/25/2019. CT 11/28/2019 revealed a right renal hematoma 8.9 cm.  Hemoglobin down to 5.1 today.  She has not received blood and is hemodynamically stable.  She looks well on exam and sitting in a chair.  Messaged with Dr. Bonney Roussel and Dr. Kathlene Cote.  I would recommend blood transfusion and continued monitoring.  If she continues to bleed and require future transfusion then I would recommend repeat imaging and possible embolization.  I agree with Dr. Kathlene Cote, we want to hold off on embolization unless absolutely necessary to limit the deleterious effects of contrast and embolization on future kidney function.  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

## 2019-11-29 NOTE — Progress Notes (Signed)
[facility] Progress Note  Subjective: creat up 3.4, BP's well controlled this am.  Hb down 5.4  Vitals:   11/28/19 1750 11/28/19 2056 11/29/19 0125 11/29/19 0530  BP: (!) 131/54 133/62 (!) 122/48 (!) 131/57  Pulse: 74 80 71 73  Resp: $Remo'13 15  16  'KVCng$ Temp: 100.2 F (37.9 C) 99.2 F (37.3 C)  98.5 F (36.9 C)  TempSrc: Oral Oral  Oral  SpO2: 94% 96% 91% 90%  Weight:      Height:        Exam: Gen alert, wdwn, no distress Sclera anicteric, throat clear  Chest clear bilat, no rales or wheezing RRR 2/6 sem , noRG Abd soft ntnd no mass or ascites +bs, no CVAT Ext no edema  Neuro is alert, Ox 3 , nf    Date                Creat               eGFR ml/min  2008-2014      0.7- 0.8  2015- 16         1.31- 2.24  2017- 18         1.18- 1.88  2019- 2020     2.03- 2.70        20- 31, CKD IV  aug 2021        2.09  11/3                3.08  11/4                3.00, 2.76   Home meds:  - norvasc 10/ clonidine 0.2 tid/ metoprolol xl 100 qd/ minoxidil 2.5 tid/ telmisartan-hctz 80-25 qd/ lasix 40 mg qd prn  - repaglinide $RemoveBefor'2mg'[id number]$  tid/ bromocriptine 2.5 qd' protonix qd  - asa 325/ lipitor 80  - prn's/ vitamins/ supplements    UA 11/3 - >300 protein, 21-50 rbc, 0-5 wbc, rare bact    CT 11/3 - Adrenals/Urinary Tract: High density large crescentic shaped fluid collection noted around right kidney compatible with perinephric hematoma, 8.9 x 7.4 cm. This compresses the right kidney. Punctate nonobstructing stone in the upper pole of the right kidney. No hydronephrosis. Hematoma also appears to extend around the right adrenal gland. Stranding also continues in the right retroperitoneum inferior to the right kidney. Left adrenal gland is unremarkable. Urinary bladder unremarkable.  Assessment/ Plan: 1. 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  - BP's much better this am, prob combination of IV acei, 3 home po bp meds, lasix and Hb drop.  IV ace held for ^creat, cont other meds w/ prn hydralazine prn.  - Hb drop to 5's, may be still bleeding, will need urology input have d/w pmd 2. AoCKD 4 - b/l creat 2.0- 2.7, eGFR 20- 31 ml/min from 2019- 8/21. 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  - creat up this am, holding IV acei for now, o/w as above - hold orders placed on all BP meds , keep SBP around 120- 160 ideally 3. IDDM - per primary    Rob Aynsley Fleet 11/29/2019, 9:17 AM   Recent Labs  Lab 11/28/19 1041 11/29/19 0425  K 4.4 4.6  BUN 66* 72*  CREATININE 2.76* 3.49*  CALCIUM 8.1*  7.8*  HGB 7.2* 5.1*   Inpatient medications: . sodium chloride   Intravenous Once  . atorvastatin  80 mg Oral Daily  . bromocriptine  1.25 mg Oral Daily  . cloNIDine  0.2 mg Oral TID  . enalaprilat  0.625 mg Intravenous Q6H  . insulin aspart  0-15 Units Subcutaneous TID WC  . insulin aspart  0-5 Units Subcutaneous QHS  . metoprolol tartrate  50 mg Oral BID  . minoxidil  2.5 mg Oral TID  . pantoprazole  40 mg Oral Daily   . cefTRIAXone (ROCEPHIN)  IV 1 g (11/29/19 0133)   acetaminophen **OR** acetaminophen, alum & mag hydroxide-simeth, hydrALAZINE, HYDROcodone-acetaminophen, morphine injection, ondansetron **OR** ondansetron (ZOFRAN) IV, polyethylene glycol

## 2019-11-29 NOTE — Progress Notes (Signed)
Referring Physician(s): Dr. Tyrone Nine  Supervising Physician: Aletta Edouard  Patient Status:  [facility] - In-pt  Chief Complaint: Right flank pain.   Subjective:   History of proteinuria s/p right random renal biopsy in IR 30/0/7622; complicated by development of right flank pain and hypertension secondary to right perinephric hematoma.  Patient laying in bed, alert and oriented. She endorses some right flank pain but states it is getting better. She is currently receiving an infusion of PRBCs which the bedside nurse states is her second unit.    Allergies: Patient has no known allergies.  Medications: Prior to Admission medications   Medication Sig Start Date End Date Taking? Authorizing Provider  aspirin 325 MG tablet Take 325 mg by mouth daily.   Yes [provider]  atorvastatin (LIPITOR) 80 MG tablet TAKE 1 TABLET BY MOUTH EVERY DAY Patient taking differently: Take 80 mg by mouth daily.  06/12/19  Yes Marrian Salvage, FNP  bromocriptine (PARLODEL) 2.5 MG tablet TAKE 1/2 TABLET BY MOUTH EVERY DAY Patient taking differently: Take 2.5 mg by mouth daily.  11/14/19  Yes Renato Shin, MD  calcitRIOL (ROCALTROL) 0.25 MCG capsule Take 0.25 mcg by mouth daily.   Yes [provider]  cloNIDine (CATAPRES) 0.2 MG tablet Take 1 tablet (0.2 mg total) by mouth 3 (three) times daily. 05/21/19  Yes Marrian Salvage, FNP  furosemide (LASIX) 40 MG tablet Take 1 tablet (40 mg total) by mouth See admin instructions. Take 1 tablet daily, can take another pill if ankles are swelling -Hold for the next 3 days and resume on 09/13/2017 Patient taking differently: Take 40 mg by mouth daily as needed for edema.  09/09/17  Yes Elgergawy, Silver Huguenin, MD  metoprolol succinate (TOPROL-XL) 100 MG 24 hr tablet Take 1 tablet (100 mg total) by mouth daily. for high blood pressure 08/20/19  Yes Marrian Salvage, FNP  minoxidil (LONITEN) 2.5 MG tablet Take 2.5 mg by mouth 3 (three) times  daily.   Yes [provider]  pantoprazole (PROTONIX) 40 MG tablet Take 1 tablet (40 mg total) by mouth daily. 06/12/19  Yes Marrian Salvage, FNP  polyethylene glycol (MIRALAX / GLYCOLAX) 17 g packet Take 34-51 g by mouth daily as needed for moderate constipation.   Yes [provider]  repaglinide (PRANDIN) 2 MG tablet TAKE 1 TABLET (2 MG TOTAL) BY MOUTH 3 (THREE) TIMES DAILY BEFORE MEALS. 02/22/19  Yes Renato Shin, MD  telmisartan-hydrochlorothiazide (MICARDIS HCT) 80-25 MG tablet TAKE 1 TABLET BY MOUTH EVERY DAY Patient taking differently: Take 1 tablet by mouth daily.  10/03/19  Yes Marrian Salvage, FNP  amLODipine (NORVASC) 10 MG tablet Take 1 tablet (10 mg total) by mouth daily. Patient not taking: Reported on 11/20/2019 03/26/15   [place], FNP  Blood Glucose Monitoring Suppl ([place]) w/Device KIT 1 each by Does not apply route 2 (two) times daily. E11.9    [provider]  glucose blood (ONETOUCH VERIO) test strip USE AS INSTRUCTED (TESTS 2 TIMES A DAY) 05/20/19   Marrian Salvage, FNP  OneTouch Delica Lancets 63F MISC 1 each by Does not apply route 2 (two) times daily. E11.9    [provider]  vitamin B-12 (CYANOCOBALAMIN) 100 MCG tablet Take 1 tablet (100 mcg total) by mouth at bedtime. Patient not taking: Reported on 11/05/2019 08/20/19   Marrian Salvage, FNP     Vital Signs: BP (!) 132/56   Pulse 72  Temp 100 F (37.8 C) (Oral)   Resp 19   Ht 5\' 2"  (1.575 m)   Wt 146 lb 9.6 oz (66.5 kg)   SpO2 96%   BMI 26.81 kg/m   Physical Exam Constitutional:      General: She is not in acute distress. Pulmonary:     Effort: Pulmonary effort is normal.  Abdominal:     Comments: Right flank pain with mild-moderate tenderness. No erythema or drainage.   Skin:    General: Skin is warm and dry.  Neurological:     Mental Status: She is alert and oriented to person, place, and time.      Imaging: CT ABDOMEN PELVIS WO CONTRAST  Result Date: 11/29/2019 CLINICAL DATA:  Status post random renal biopsy the right kidney on 11/25/2019 complicated by hemorrhage. Decrease in hemoglobin during admission after detection of post biopsy hemorrhage. EXAM: CT ABDOMEN AND PELVIS WITHOUT CONTRAST TECHNIQUE: Multidetector CT imaging of the abdomen and pelvis was performed following the standard protocol without IV contrast. COMPARISON:  11/27/2019 FINDINGS: Lower chest: Slight increase in a small right pleural effusion with associated right basilar atelectasis. Hepatobiliary: No focal liver abnormality is seen. No gallstones, gallbladder wall thickening, or biliary dilatation. Pancreas: Unremarkable. No pancreatic ductal dilatation or surrounding inflammatory changes. Spleen: Normal in size without focal abnormality. Adrenals/Urinary Tract: Hyperdense, predominantly subcapsular hemorrhage surrounding the right kidney and predominantly located along the lateral aspect of the kidney shows very slight enlargement since the prior study. Maximal subcapsular hematoma diameter is approximately 9 cm transversely and 12.2 cm in oblique height compared to comparable measurements of 8.8 cm and 12 cm on the prior study. Overall hematoma volume is very similar. There is a small component of additional hemorrhage inferior to the right kidney in the retroperitoneum which appears stable since the prior study. No intraperitoneal hemorrhage identified. Mass effect on the right kidney again noted without evidence of hydronephrosis. The left kidney remains stable and unremarkable in appearance. The bladder is unremarkable. Stomach/Bowel: No evidence of bowel obstruction, ileus or free intraperitoneal air. Vascular/Lymphatic: Stable atherosclerosis of the aorta and iliac arteries. No evidence of aneurysm. No enlarged lymph nodes identified. Reproductive: Status post hysterectomy. No adnexal masses. Other: No abdominal wall  hernia or abnormality. No abdominopelvic ascites. Musculoskeletal: No acute or significant osseous findings. IMPRESSION: Essentially stable size of predominately subcapsular hemorrhage surrounding the right kidney with minimal increase in hematoma dimensions of only roughly 2 mm. Hemorrhage remains predominantly confined in a subcapsular location around the right kidney with stable small amount of additional retroperitoneal hemorrhage inferior to the right kidney. Electronically Signed   By: 13/03/2019 M.D.   On: 11/29/2019 12:55   CT L-SPINE NO CHARGE  Result Date: 11/28/2019 CLINICAL DATA:  Initial evaluation for right flank pain status post recent right renal biopsy. EXAM: CT LUMBAR SPINE WITHOUT CONTRAST TECHNIQUE: Multidetector CT imaging of the lumbar spine was performed without intravenous contrast administration. Multiplanar CT image reconstructions were also generated. COMPARISON:  Concomitant CT of the abdomen and pelvis from the same day. FINDINGS: Segmentation: Standard. Alignment: Physiologic.  No listhesis. Vertebrae: Vertebral body height maintained without acute or chronic fracture. Visualized sacrum and pelvis intact. SI joints approximated symmetric. Small benign bone island noted within the mid sacrum. No other discrete or worrisome osseous lesions. Paraspinal and other soft tissues: Right pleural effusion partially visualized. Large right perinephric hematoma with stranding/hemorrhage within the right retroperitoneal space, partially visualized, better seen on concomitant CT of the abdomen and pelvis. Moderate  aorto bi-iliac atherosclerotic disease. Disc levels: L1-2:  Negative interspace.  Mild facet hypertrophy.  No stenosis. L2-3: Diffuse disc bulge with disc desiccation. Mild reactive endplate change. Mild facet hypertrophy. Resultant mild spinal stenosis. Foramina remain patent. L3-4: Mild diffuse disc bulge. Mild bilateral facet hypertrophy. Probable mild spinal stenosis. Mild  bilateral L3 foraminal narrowing. L4-5: Advanced degenerative intervertebral disc space narrowing with ankylosis of the L4 and L5 vertebral bodies, and obliteration of the L4-5 interspace. Associated circumferential marginal endplate spurring. Moderate facet and ligament flavum hypertrophy. Resultant moderate spinal stenosis. Moderate to severe bilateral L4 foraminal narrowing. L5-S1: Mild disc bulge. Severe bilateral facet degeneration, right worse than left. Resultant mild narrowing of the lateral recesses bilaterally. Foramina remain patent. IMPRESSION: 1. No acute abnormality within the lumbar spine. 2. Large right perinephric hematoma with stranding/hemorrhage within the right retroperitoneal space, partially visualized, better evaluated on concomitant CT of the abdomen and pelvis. 3. 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 Resultant moderate spinal stenosis with moderate to severe bilateral L4 foraminal narrowing. 4. Additional mild noncompressive disc bulging elsewhere within the lumbar spine as above. No other significant stenosis or neural impingement. 5. Right pleural effusion, partially visualized. 6. Aortic Atherosclerosis (ICD10-I70.0). Electronically Signed   By: Jeannine Boga M.D.   On: 11/28/2019 01:26   CT Renal Stone Study  Result Date: 11/28/2019 CLINICAL DATA:  Right flank pain recent right kidney biopsy. EXAM: CT ABDOMEN AND PELVIS WITHOUT CONTRAST TECHNIQUE: Multidetector CT imaging of the abdomen and pelvis was performed following the standard protocol without IV contrast. COMPARISON:  08/14/2017 FINDINGS: Lower chest: Trace right pleural effusion.  Right base atelectasis. Hepatobiliary: No focal hepatic abnormality. Gallbladder unremarkable. Pancreas: No focal abnormality or ductal dilatation. Spleen: No focal abnormality.  Normal size. Adrenals/Urinary Tract: High density large crescentic shaped fluid collection noted around the right  kidney compatible with perinephric hematoma, 8.9 x 7.4 cm. This compresses the right kidney. Punctate nonobstructing stone in the upper pole of the right kidney. No hydronephrosis. Hematoma also appears to extend around the right adrenal gland. Stranding also continues in the right retroperitoneum inferior to the right kidney. Left adrenal gland is unremarkable. Urinary bladder unremarkable. Stomach/Bowel: Stomach, large and small bowel grossly unremarkable. Vascular/Lymphatic: Heavily calcified aorta and iliac vessels. No aneurysm or adenopathy. Reproductive: No mass. Other: No free fluid or free air. Musculoskeletal: No acute bony abnormality. IMPRESSION: Large right perinephric hematoma measuring 8.9 x 7.4 cm. This has mass effect on the right kidney. 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 Stranding also continues around the right adrenal gland and inferior to the right kidney in the right retroperitoneum. Trace right pleural effusion.  No pneumothorax. These results were called by telephone at the time of interpretation on 11/28/2019 at 12:08 am to provider DAN FLOYD , who verbally acknowledged these results. Electronically Signed   By: Rolm Baptise M.D.   On: 11/28/2019 00:12    Labs:  CBC: Recent Labs    11/25/19 0556 11/25/19 0556 11/27/19 2136 11/28/19 0500 11/28/19 1041 11/29/19 0425  WBC 5.3  --  7.1 9.7  --  8.6  HGB 9.7*   < > 9.1* 7.1* 7.2* 5.1*  HCT 29.7*   < > 27.6* 22.3* 22.2* 15.8*  PLT 226  --  223 190  --  160   < > = values in this interval not displayed.    COAGS: Recent Labs    11/11/19 0620 11/25/19 0556  INR  1.0 1.0    BMP: Recent Labs    11/27/19 2136 11/28/19 0500 11/28/19 1041 11/29/19 0425  NA 133* 135 134* 134*  K 3.7 4.5 4.4 4.6  CL 101 102 101 100  CO2 $Re'24 22 22 23  'XTp$ GLUCOSE 262* 323* 243* 179*  BUN 67* 65* 66* 72*  CALCIUM 8.4* 8.0* 8.1* 7.8*  CREATININE 3.08* 3.00* 2.76* 3.49*  GFRNONAA 16* 16* 18* 14*     LIVER FUNCTION TESTS: Recent Labs    09/24/19 1044 11/07/19 1011 11/28/19 0500  BILITOT 1.1  --  0.7  AST 22  --  27  ALT 19  --  29  ALKPHOS  --   --  87  PROT 7.7  --  6.4*  ALBUMIN  --  2.4* 2.8*    Assessment and Plan:  History of proteinuria s/p right random renal biopsy in IR 67/06/7207; complicated by development of right flank pain and hypertension secondary to right perinephric hematoma. The patient's hemoglobin dropped to 5.1 this morning and repeat imaging was done. Patient receiving PRBCs.    CT abdomen 11/29/19: IMPRESSION: Essentially stable size of predominately subcapsular hemorrhage surrounding the right kidney with minimal increase in hematoma dimensions of only roughly 2 mm. Hemorrhage remains predominantly confined in a subcapsular location around the right kidney with stable small amount of additional retroperitoneal hemorrhage inferior to the right kidney.  No plans for IR intervention at this point and we are trying to avoid an embolization procedure if possible to preserve kidney function. Urology is now following with recommendations for blood transfusions and continued monitoring.   Further plans per [facility]/nephrology/urology- appreciate and agree with management.  IR to follow.  Electronically Signed: Soyla Dryer, AGACNP-BC [phone] 11/29/2019, 3:09 PM   I spent a total of 15 Minutes at the the patient's bedside AND on the patient's hospital floor or unit, greater than 50% of which was counseling/coordinating care for right perinephric hematoma.

## 2019-11-29 NOTE — Progress Notes (Signed)
Patient ID: [name], female   DOB: 11-11-1950, 69 y.o.   MRN: [id number]  PROGRESS NOTE    [name]  YNW:[id number] DOB: 29-Jun-1950 DOA: 11/27/2019 PCP: Marrian Salvage, FNP   Brief Narrative:  69 year old female with history of hypertension, hypothyroidism, diabetes mellitus type 2, diverticulosis and tobacco dependence, recent right kidney biopsy on 11/25/2019 for proteinuria as per nephrology recommendations presented on 11/26/2019 with right flank pain.  On presentation, blood pressure was 220/87; hemoglobin of 9.1, creatinine of 3.08.  CT of the abdomen/pelvis showed a large perinephric hematoma with some compression of kidney.  IR and nephrology were consulted.  Assessment & Plan:   Perinephric hematoma Acute blood loss anemia -Patient had a recent right kidney biopsy on 11/25/2019 and presented with a large right perinephric hematoma with some compression of kidney.  IR and nephrology were consulted and currently being managed conservatively.  Hemoglobin 9.1 on presentation.  7.2 yesterday and 5.1 today. -I have communicated with nephrology/Dr. Jonnie Finner, urology/Dr. Junious Silk and IR/Dr. Kathlene Cote: will get stat CT of the abdomen and pelvis without contrast to follow-up on the hematoma.  2 units of packed red cells have been already ordered.  Continue monitoring H&H.  Patient currently looks hemodynamically stable. -DC antibiotics  Acute kidney injury on chronic kidney disease stage IV -Baseline creatinine between 2-2.7.  Nephrology following.  Creatinine 2.49 today.  Continue monitoring.  IV Lasix has been discontinued by nephrology.  Hypertension uncontrolled -Blood pressure improving.  Continue clonidine, metoprolol, minoxidil.  She is also getting IV enalapril as per nephrology.  Hyperlipidemia -continue statin  Diabetes mellitus type 2 with hyperglycemia -A1c 8.  Continue CBGs with SSI   DVT prophylaxis: Heparin discontinued because of hematoma.  SCDs Code  Status: Full Family Communication: None at bedside Disposition Plan: Status is: Inpatient  Remains inpatient appropriate because:Inpatient level of care appropriate due to severity of illness   Dispo: The patient is from: Home              Anticipated d/c is to: Home              Anticipated d/c date is: 2 days              Patient currently is not medically stable to d/c.  Consultants: Nephrology/urology/IR  Procedures: None  Antimicrobials:  Anti-infectives (From admission, onward)   Start     Dose/Rate Route Frequency Ordered Stop   11/28/19 0215  cefTRIAXone (ROCEPHIN) 1 g in sodium chloride 0.9 % 100 mL IVPB  Status:  Discontinued        1 g 200 mL/hr over 30 Minutes Intravenous Every 24 hours 11/28/19 0208 11/29/19 1000       Subjective: Patient seen and examined at bedside.  Complains of intermittent abdominal pain but denies any worsening.  Denies current nausea or vomiting.  No overnight fever or vomiting reported.  Objective: Vitals:   11/29/19 0125 11/29/19 0530 11/29/19 0925 11/29/19 1022  BP: (!) 122/48 (!) 131/57 (!) 131/54 (!) 111/47  Pulse: 71 73 74 62  Resp:  16 20 20   Temp:  98.5 F (36.9 C) 98.5 F (36.9 C) 99.5 F (37.5 C)  TempSrc:  Oral Oral Oral  SpO2: 91% 90% 96% 92%  Weight:      Height:        Intake/Output Summary (Last 24 hours) at 11/29/2019 1115 Last data filed at 11/29/2019 1028 Gross per 24 hour  Intake 1102.1 ml  Output 800 ml  Net  302.1 ml   Filed Weights   11/27/19 2131 11/28/19 1240  Weight: 62.6 kg 66.5 kg    Examination:  General exam: Appears calm and comfortable.  Elderly female sitting on chair.  Awake.  Poor historian Respiratory system: Bilateral decreased breath sounds at bases with some scattered crackles Cardiovascular system: S1 & S2 heard, Rate controlled Gastrointestinal system: Abdomen is nondistended, soft and nontender. Normal bowel sounds heard. Genitourinary: Mild right flank tenderness  present Extremities: No cyanosis, clubbing; trace lower extremity edema Central nervous system: Alert and oriented. No focal neurological deficits. Moving extremities Skin: No rashes, lesions or ulcers Psychiatry: Flat affect   Data Reviewed: I have personally reviewed following labs and imaging studies  CBC: Recent Labs  Lab 11/25/19 0556 11/27/19 2136 11/28/19 0500 11/28/19 1041 11/29/19 0425  WBC 5.3 7.1 9.7  --  8.6  NEUTROABS  --   --   --   --  6.2  HGB 9.7* 9.1* 7.1* 7.2* 5.1*  HCT 29.7* 27.6* 22.3* 22.2* 15.8*  MCV 97.1 98.2 99.1  --  98.1  PLT 226 223 190  --  782   Basic Metabolic Panel: Recent Labs  Lab 11/27/19 2136 11/28/19 0500 11/28/19 1041 11/29/19 0425  NA 133* 135 134* 134*  K 3.7 4.5 4.4 4.6  CL 101 102 101 100  CO2 24 22 22 23   GLUCOSE 262* 323* 243* 179*  BUN 67* 65* 66* 72*  CREATININE 3.08* 3.00* 2.76* 3.49*  CALCIUM 8.4* 8.0* 8.1* 7.8*  MG  --   --   --  2.4   GFR: Estimated Creatinine Clearance: 13.6 mL/min (A) (by C-G formula based on SCr of 3.49 mg/dL (H)). Liver Function Tests: Recent Labs  Lab 11/28/19 0500  AST 27  ALT 29  ALKPHOS 87  BILITOT 0.7  PROT 6.4*  ALBUMIN 2.8*   No results for input(s): LIPASE, AMYLASE in the last 168 hours. No results for input(s): AMMONIA in the last 168 hours. Coagulation Profile: Recent Labs  Lab 11/25/19 0556  INR 1.0   Cardiac Enzymes: No results for input(s): CKTOTAL, CKMB, CKMBINDEX, TROPONINI in the last 168 hours. BNP (last 3 results) No results for input(s): PROBNP in the last 8760 hours. HbA1C: Recent Labs    11/28/19 0500 11/29/19 0425  HGBA1C 8.3* 8.0*   CBG: Recent Labs  Lab 11/28/19 0751 11/28/19 1138 11/28/19 1656 11/28/19 2111 11/29/19 0740  GLUCAP 266* 213* 128* 86 153*   Lipid Profile: No results for input(s): CHOL, HDL, LDLCALC, TRIG, CHOLHDL, LDLDIRECT in the last 72 hours. Thyroid Function Tests: No results for input(s): TSH, T4TOTAL, FREET4, T3FREE,  THYROIDAB in the last 72 hours. Anemia Panel: No results for input(s): VITAMINB12, FOLATE, FERRITIN, TIBC, IRON, RETICCTPCT in the last 72 hours. Sepsis Labs: No results for input(s): PROCALCITON, LATICACIDVEN in the last 168 hours.  Recent Results (from the past 240 hour(s))  Respiratory Panel by RT PCR (Flu A&B, Covid) - Nasopharyngeal Swab     Status: None   Collection Time: 11/28/19  4:55 AM   Specimen: Nasopharyngeal Swab  Result Value Ref Range Status   SARS Coronavirus 2 by RT PCR NEGATIVE NEGATIVE Final    Comment: (NOTE) SARS-CoV-2 target nucleic acids are NOT DETECTED.  The SARS-CoV-2 RNA is generally detectable in upper respiratoy specimens during the acute phase of infection. The lowest concentration of SARS-CoV-2 viral copies this assay can detect is 131 copies/mL. A negative result does not preclude SARS-Cov-2 infection and should not be used as the  sole basis for treatment or other patient management decisions. A negative result may occur with  improper specimen collection/handling, submission of specimen other than nasopharyngeal swab, presence of viral mutation(s) within the areas targeted by this assay, and inadequate number of viral copies (<131 copies/mL). A negative result must be combined with clinical observations, patient history, and epidemiological information. The expected result is Negative.  Fact Sheet for Patients:  PinkCheek.be  Fact Sheet for Healthcare Providers:  GravelBags.it  This test is no t yet approved or cleared by the [place] FDA and  has been authorized for detection and/or diagnosis of SARS-CoV-2 by FDA under an Emergency Use Authorization (EUA). This EUA will remain  in effect (meaning this test can be used) for the duration of the COVID-19 declaration under Section 564(b)(1) of the Act, 21 U.S.C. section 360bbb-3(b)(1), unless the authorization is terminated or revoked  sooner.     Influenza A by PCR NEGATIVE NEGATIVE Final   Influenza B by PCR NEGATIVE NEGATIVE Final    Comment: (NOTE) The Xpert Xpress SARS-CoV-2/FLU/RSV assay is intended as an aid in  the diagnosis of influenza from Nasopharyngeal swab specimens and  should not be used as a sole basis for treatment. Nasal washings and  aspirates are unacceptable for Xpert Xpress SARS-CoV-2/FLU/RSV  testing.  Fact Sheet for Patients: PinkCheek.be  Fact Sheet for Healthcare Providers: GravelBags.it  This test is not yet approved or cleared by the [place] FDA and  has been authorized for detection and/or diagnosis of SARS-CoV-2 by  FDA under an Emergency Use Authorization (EUA). This EUA will remain  in effect (meaning this test can be used) for the duration of the  Covid-19 declaration under Section 564(b)(1) of the Act, 21  U.S.C. section 360bbb-3(b)(1), unless the authorization is  terminated or revoked. Performed at [facility], [place] [address], [place], [place] [address]          Radiology Studies: CT L-SPINE NO CHARGE  Result Date: 11/28/2019 CLINICAL DATA:  Initial evaluation for right flank pain status post recent right renal biopsy. EXAM: CT LUMBAR SPINE WITHOUT CONTRAST TECHNIQUE: Multidetector CT imaging of the lumbar spine was performed without intravenous contrast administration. Multiplanar CT image reconstructions were also generated. COMPARISON:  Concomitant CT of the abdomen and pelvis from the same day. FINDINGS: Segmentation: Standard. Alignment: Physiologic.  No listhesis. Vertebrae: Vertebral body height maintained without acute or chronic fracture. Visualized sacrum and pelvis intact. SI joints approximated symmetric. Small benign bone island noted within the mid sacrum. No other discrete or worrisome osseous lesions. Paraspinal and other soft tissues: Right pleural effusion partially  visualized. Large right perinephric hematoma with stranding/hemorrhage within the right retroperitoneal space, partially visualized, better seen on concomitant CT of the abdomen and pelvis. Moderate aorto bi-iliac atherosclerotic disease. Disc levels: L1-2:  Negative interspace.  Mild facet hypertrophy.  No stenosis. L2-3: Diffuse disc bulge with disc desiccation. Mild reactive endplate change. Mild facet hypertrophy. Resultant mild spinal stenosis. Foramina remain patent. L3-4: Mild diffuse disc bulge. Mild bilateral facet hypertrophy. Probable mild spinal stenosis. Mild bilateral L3 foraminal narrowing. L4-5: Advanced degenerative intervertebral disc space narrowing with ankylosis of the L4 and L5 vertebral bodies, and obliteration of the L4-5 interspace. Associated circumferential marginal endplate spurring. Moderate facet and ligament flavum hypertrophy. Resultant moderate spinal stenosis. Moderate to severe bilateral L4 foraminal narrowing. L5-S1: Mild disc bulge. Severe bilateral facet degeneration, right worse than left. Resultant mild narrowing of the lateral recesses bilaterally. Foramina remain patent. IMPRESSION: 1. No  acute abnormality within the lumbar spine. 2. Large right perinephric hematoma with stranding/hemorrhage within the right retroperitoneal space, partially visualized, better evaluated on concomitant CT of the abdomen and pelvis. 3. 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 Resultant moderate spinal stenosis with moderate to severe bilateral L4 foraminal narrowing. 4. Additional mild noncompressive disc bulging elsewhere within the lumbar spine as above. No other significant stenosis or neural impingement. 5. Right pleural effusion, partially visualized. 6. Aortic Atherosclerosis (ICD10-I70.0). Electronically Signed   By: Jeannine Boga M.D.   On: 11/28/2019 01:26   CT Renal Stone Study  Result Date: 11/28/2019 CLINICAL DATA:  Right flank  pain recent right kidney biopsy. EXAM: CT ABDOMEN AND PELVIS WITHOUT CONTRAST TECHNIQUE: Multidetector CT imaging of the abdomen and pelvis was performed following the standard protocol without IV contrast. COMPARISON:  08/14/2017 FINDINGS: Lower chest: Trace right pleural effusion.  Right base atelectasis. Hepatobiliary: No focal hepatic abnormality. Gallbladder unremarkable. Pancreas: No focal abnormality or ductal dilatation. Spleen: No focal abnormality.  Normal size. Adrenals/Urinary Tract: High density large crescentic shaped fluid collection noted around the right kidney compatible with perinephric hematoma, 8.9 x 7.4 cm. This compresses the right kidney. Punctate nonobstructing stone in the upper pole of the right kidney. No hydronephrosis. Hematoma also appears to extend around the right adrenal gland. Stranding also continues in the right retroperitoneum inferior to the right kidney. Left adrenal gland is unremarkable. Urinary bladder unremarkable. Stomach/Bowel: Stomach, large and small bowel grossly unremarkable. Vascular/Lymphatic: Heavily calcified aorta and iliac vessels. No aneurysm or adenopathy. Reproductive: No mass. Other: No free fluid or free air. Musculoskeletal: No acute bony abnormality. IMPRESSION: Large right perinephric hematoma measuring 8.9 x 7.4 cm. This has mass effect on the right kidney. 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 Stranding also continues around the right adrenal gland and inferior to the right kidney in the right retroperitoneum. Trace right pleural effusion.  No pneumothorax. These results were called by telephone at the time of interpretation on 11/28/2019 at 12:08 am to provider DAN FLOYD , who verbally acknowledged these results. Electronically Signed   By: Rolm Baptise M.D.   On: 11/28/2019 00:12        Scheduled Meds: . atorvastatin  80 mg Oral Daily  . bromocriptine  1.25 mg Oral Daily  . cloNIDine  0.2 mg Oral TID  .  enalaprilat  0.625 mg Intravenous Q6H  . insulin aspart  0-15 Units Subcutaneous TID WC  . insulin aspart  0-5 Units Subcutaneous QHS  . metoprolol tartrate  50 mg Oral BID  . minoxidil  2.5 mg Oral TID  . pantoprazole  40 mg Oral Daily   Continuous Infusions:        Aline August, MD [facility] 11/29/2019, 11:15 AM

## 2019-11-29 NOTE — TOC Progression Note (Signed)
Transition of Care [facility]) - Progression Note    Patient Details  Name: [name] MRN: [id number] Date of Birth: 03-16-1950  [facility]) CM/SW Contact  Purcell Mouton, RN Phone Number: 11/29/2019, 1:55 PM  Clinical Narrative:     Pt from home alone. TOC will follow for discharge needs.   Expected Discharge Plan: Home/Self Care Barriers to Discharge: No Barriers Identified  Expected Discharge Plan and Services Expected Discharge Plan: Home/Self Care       Living arrangements for the past 2 months: Single Family Home Expected Discharge Date:  (unknown)                                     Social Determinants of Health (SDOH) Interventions    Readmission Risk Interventions No flowsheet data found.

## 2019-11-29 NOTE — Consult Note (Signed)
Urology Consult   Physician requesting consult: Bunnie Pion  Reason for consult: Perinephric hematoma  History of Present Illness: [name] is a 69 y.o. female with PMH significant for DM II, diveritculosis, GERD, HTN, hyperthyroidism, acute on chronic kidney disease, and multiple strokes who presented to the ED yesterday with c/o right flank pain. CT 11/28/2019 revealed a right renal hematoma 8.9 cm after US guided renal biopsy performed on 11/25/2019.  She was admitted for medical management. Hemoglobin down to 5.1 today.  She has remained hemodynamically stable and is currently receiving a blood transfusion.  Repeat CT performed approx 1 hour ago.  She is resting comfortably and is without complaint other than fatigue. She denies F/C, HA, CP, SOB, N/V, abdominal pain, and diarrhea/constipation. She is being followed/treated by IM and nephrology.    Past Medical History:  Diagnosis Date  . Acute kidney injury ([place]) 01/08/2015  . Arthritis   . Chronic back pain   . Constipation   . Cough   . Diabetes mellitus, type 2 ([place])   . Diverticulosis   . Fibroid    patient thinks this was the reason for her hysterectomy  . GERD (gastroesophageal reflux disease)   . Heart murmur   . History of sebaceous cyst   . Hyperlipemia   . Hyperplastic colon polyp   . Hypertension   . Hyperthyroidism   . Lumbar radiculopathy   . Shortness of breath 09/13/2013  . Stroke ([place]) 2015?   x4   . Tobacco abuse   . 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 ABDOMINAL HYSTERECTOMY     --?ovaries remain  . COLONOSCOPY W/ BIOPSIES  09/11/2008  . RETINOPATHY SURGERY Bilateral 2013   Dr. Zigmund Daniel     Current [facility] Medications:  Home meds:  . Current Meds  Medication Sig  . aspirin 325 MG tablet Take 325 mg by mouth daily.  [name] atorvastatin (LIPITOR) 80 MG tablet TAKE 1 TABLET BY MOUTH EVERY DAY  (Patient taking differently: Take 80 mg by mouth daily. )  . bromocriptine (PARLODEL) 2.5 MG tablet TAKE 1/2 TABLET BY MOUTH EVERY DAY (Patient taking differently: Take 2.5 mg by mouth daily. )  . calcitRIOL (ROCALTROL) 0.25 MCG capsule Take 0.25 mcg by mouth daily.  . cloNIDine (CATAPRES) 0.2 MG tablet Take 1 tablet (0.2 mg total) by mouth 3 (three) times daily.  . furosemide (LASIX) 40 MG tablet Take 1 tablet (40 mg total) by mouth See admin instructions. Take 1 tablet daily, can take another pill if ankles are swelling -Hold for the next 3 days and resume on 09/13/2017 (Patient taking differently: Take 40 mg by mouth daily as needed for edema. )  . metoprolol succinate (TOPROL-XL) 100 MG 24 hr tablet Take 1 tablet (100 mg total) by mouth daily. for high blood pressure  . minoxidil (LONITEN) 2.5 MG tablet Take 2.5 mg by mouth 3 (three) times daily.  . pantoprazole (PROTONIX) 40 MG tablet Take 1 tablet (40 mg total) by mouth daily.  . polyethylene glycol (MIRALAX / GLYCOLAX) 17 g packet Take 34-51 g by mouth daily as needed for moderate constipation.  . repaglinide (PRANDIN) 2 MG tablet TAKE 1 TABLET (2 MG TOTAL) BY MOUTH 3 (THREE) TIMES DAILY BEFORE MEALS.  [name] telmisartan-hydrochlorothiazide (MICARDIS HCT) 80-25 MG tablet TAKE 1 TABLET BY MOUTH EVERY DAY (Patient taking differently: Take 1 tablet by mouth daily. )  Scheduled Meds: . atorvastatin  80 mg Oral Daily  . bromocriptine  1.25 mg Oral Daily  . cloNIDine  0.2 mg Oral TID  . enalaprilat  0.625 mg Intravenous Q6H  . insulin aspart  0-15 Units Subcutaneous TID WC  . insulin aspart  0-5 Units Subcutaneous QHS  . metoprolol tartrate  50 mg Oral BID  . minoxidil  2.5 mg Oral TID  . pantoprazole  40 mg Oral Daily   Continuous Infusions: PRN Meds:.acetaminophen **OR** acetaminophen, alum & mag hydroxide-simeth, hydrALAZINE, HYDROcodone-acetaminophen, morphine injection, ondansetron **OR** ondansetron (ZOFRAN) IV, polyethylene  glycol  Allergies: No Known Allergies  Family History  Problem Relation Age of Onset  . Hypertension Mother   . Sleep apnea Mother   . Diabetes Mother   . Hyperlipidemia Mother   . Lung cancer Father        smoker  . Hypertension Sister   . Diabetes Sister   . Hypertension Brother   . Hypertension Sister   . Diabetes Brother   . Hypertension Brother   . Breast cancer Neg Hx   . Colon cancer Neg Hx   . Esophageal cancer Neg Hx   . Pancreatic cancer Neg Hx   . Liver disease Neg Hx   . Colon polyps Neg Hx     Social History:  reports that she quit smoking about 5 years ago. Her smoking use included cigarettes. She has a 17.50 pack-year smoking history. She has never used smokeless tobacco. She reports that she does not drink alcohol and does not use drugs.  ROS: A complete review of systems was performed.  All systems are negative except for pertinent findings as noted.  Physical Exam:  Vital signs in last 24 hours: Temp:  [98.5 F (36.9 C)-100.2 F (37.9 C)] 98.5 F (36.9 C) (11/05 1049) Pulse Rate:  [62-80] 68 (11/05 1049) Resp:  [13-20] 20 (11/05 1049) BP: (111-152)/(47-62) 123/50 (11/05 1049) SpO2:  [90 %-96 %] 96 % (11/05 1049) Weight:  [66.5 kg] 66.5 kg (11/04 1240) Constitutional:  Alert and oriented, No acute distress Cardiovascular: Regular rate and rhythm, No JVD Respiratory: Normal respiratory effort GI: Abdomen is soft, nontender, mildly distended GU: right CVA tenderness Lymphatic: No lymphadenopathy Neurologic: Grossly intact, no focal deficits Psychiatric: Normal mood and affect  Laboratory Data:  Recent Labs    11/27/19 2136 11/28/19 0500 11/28/19 1041 11/29/19 0425  WBC 7.1 9.7  --  8.6  HGB 9.1* 7.1* 7.2* 5.1*  HCT 27.6* 22.3* 22.2* 15.8*  PLT 223 190  --  160    Recent Labs    11/27/19 2136 11/28/19 0500 11/28/19 1041 11/29/19 0425  NA 133* 135 134* 134*  K 3.7 4.5 4.4 4.6  CL 101 102 101 100  GLUCOSE 262* 323* 243* 179*  BUN  67* 65* 66* 72*  CALCIUM 8.4* 8.0* 8.1* 7.8*  CREATININE 3.08* 3.00* 2.76* 3.49*     Results for orders placed or performed during the hospital encounter of 11/27/19 (from the past 24 hour(s))  Creatinine, urine, random     Status: None   Collection Time: 11/28/19  4:49 PM  Result Value Ref Range   Creatinine, Urine 149.06 mg/dL  Sodium, urine, random     Status: None   Collection Time: 11/28/19  4:49 PM  Result Value Ref Range   Sodium, Ur 17 mmol/L  Glucose, capillary     Status: Abnormal   Collection Time: 11/28/19  4:56 PM  Result Value Ref Range   Glucose-Capillary 128 (  H) 70 - 99 mg/dL  Glucose, capillary     Status: None   Collection Time: 11/28/19  9:11 PM  Result Value Ref Range   Glucose-Capillary 86 70 - 99 mg/dL   Comment 1 Notify RN   CBC with Differential/Platelet     Status: Abnormal   Collection Time: 11/29/19  4:25 AM  Result Value Ref Range   WBC 8.6 4.0 - 10.5 K/uL   RBC 1.61 (L) 3.87 - 5.11 MIL/uL   Hemoglobin 5.1 (LL) 12.0 - 15.0 g/dL   HCT 15.8 (L) 36 - 46 %   MCV 98.1 80.0 - 100.0 fL   MCH 31.7 26.0 - 34.0 pg   MCHC 32.3 30.0 - 36.0 g/dL   RDW 15.0 11.5 - 15.5 %   Platelets 160 150 - 400 K/uL   nRBC 0.0 0.0 - 0.2 %   Neutrophils Relative % 72 %   Neutro Abs 6.2 1.7 - 7.7 K/uL   Lymphocytes Relative 17 %   Lymphs Abs 1.5 0.7 - 4.0 K/uL   Monocytes Relative 10 %   Monocytes Absolute 0.9 0.1 - 1.0 K/uL   Eosinophils Relative 0 %   Eosinophils Absolute 0.0 0.0 - 0.5 K/uL   Basophils Relative 0 %   Basophils Absolute 0.0 0.0 - 0.1 K/uL   Immature Granulocytes 1 %   Abs Immature Granulocytes 0.04 0.00 - 0.07 K/uL  Basic metabolic panel     Status: Abnormal   Collection Time: 11/29/19  4:25 AM  Result Value Ref Range   Sodium 134 (L) 135 - 145 mmol/L   Potassium 4.6 3.5 - 5.1 mmol/L   Chloride 100 98 - 111 mmol/L   CO2 23 22 - 32 mmol/L   Glucose, Bld 179 (H) 70 - 99 mg/dL   BUN 72 (H) 8 - 23 mg/dL   Creatinine, Ser 3.49 (H) 0.44 - 1.00 mg/dL    Calcium 7.8 (L) 8.9 - 10.3 mg/dL   GFR, Estimated 14 (L) >60 mL/min   Anion gap 11 5 - 15  Magnesium     Status: None   Collection Time: 11/29/19  4:25 AM  Result Value Ref Range   Magnesium 2.4 1.7 - 2.4 mg/dL  Hemoglobin A1c     Status: Abnormal   Collection Time: 11/29/19  4:25 AM  Result Value Ref Range   Hgb A1c MFr Bld 8.0 (H) 4.8 - 5.6 %   Mean Plasma Glucose 182.9 mg/dL  ABO/Rh     Status: None   Collection Time: 11/29/19  5:00 AM  Result Value Ref Range   ABO/RH(D)      A POS Performed at [facility], [place] [address], [place], [place] [address]   Type and screen [place]     Status: None (Preliminary result)   Collection Time: 11/29/19  6:05 AM  Result Value Ref Range   ABO/RH(D) A POS    Antibody Screen NEG    Sample Expiration 12/02/2019,2359    Unit Number [id number]    Blood Component Type RED CELLS,LR    Unit division 00    Status of Unit ALLOCATED    Transfusion Status OK TO TRANSFUSE    Crossmatch Result Compatible    Unit Number [id number]    Blood Component Type RED CELLS,LR    Unit division 00    Status of Unit ISSUED    Transfusion Status OK TO TRANSFUSE    Crossmatch Result      Compatible Performed  at [facility], [place] [address], [place], [place] [address]   Prepare RBC (crossmatch)     Status: None   Collection Time: 11/29/19  6:05 AM  Result Value Ref Range   Order Confirmation      ORDER PROCESSED BY BLOOD BANK Performed at [facility], [place] [address], [place], [place] [address]   Glucose, capillary     Status: Abnormal   Collection Time: 11/29/19  7:40 AM  Result Value Ref Range   Glucose-Capillary 153 (H) 70 - 99 mg/dL   Comment 1 Notify RN    Comment 2 Document in Chart    Recent Results (from the past 240 hour(s))  Respiratory Panel by RT PCR (Flu A&B, Covid) - Nasopharyngeal Swab     Status: None   Collection Time: 11/28/19  4:55 AM    Specimen: Nasopharyngeal Swab  Result Value Ref Range Status   SARS Coronavirus 2 by RT PCR NEGATIVE NEGATIVE Final    Comment: (NOTE) SARS-CoV-2 target nucleic acids are NOT DETECTED.  The SARS-CoV-2 RNA is generally detectable in upper respiratoy specimens during the acute phase of infection. The lowest concentration of SARS-CoV-2 viral copies this assay can detect is 131 copies/mL. A negative result does not preclude SARS-Cov-2 infection and should not be used as the sole basis for treatment or other patient management decisions. A negative result may occur with  improper specimen collection/handling, submission of specimen other than nasopharyngeal swab, presence of viral mutation(s) within the areas targeted by this assay, and inadequate number of viral copies (<131 copies/mL). A negative result must be combined with clinical observations, patient history, and epidemiological information. The expected result is Negative.  Fact Sheet for Patients:  PinkCheek.be  Fact Sheet for Healthcare Providers:  GravelBags.it  This test is no t yet approved or cleared by the [place] FDA and  has been authorized for detection and/or diagnosis of SARS-CoV-2 by FDA under an Emergency Use Authorization (EUA). This EUA will remain  in effect (meaning this test can be used) for the duration of the COVID-19 declaration under Section 564(b)(1) of the Act, 21 U.S.C. section 360bbb-3(b)(1), unless the authorization is terminated or revoked sooner.     Influenza A by PCR NEGATIVE NEGATIVE Final   Influenza B by PCR NEGATIVE NEGATIVE Final    Comment: (NOTE) The Xpert Xpress SARS-CoV-2/FLU/RSV assay is intended as an aid in  the diagnosis of influenza from Nasopharyngeal swab specimens and  should not be used as a sole basis for treatment. Nasal washings and  aspirates are unacceptable for Xpert Xpress SARS-CoV-2/FLU/RSV   testing.  Fact Sheet for Patients: PinkCheek.be  Fact Sheet for Healthcare Providers: GravelBags.it  This test is not yet approved or cleared by the [place] FDA and  has been authorized for detection and/or diagnosis of SARS-CoV-2 by  FDA under an Emergency Use Authorization (EUA). This EUA will remain  in effect (meaning this test can be used) for the duration of the  Covid-19 declaration under Section 564(b)(1) of the Act, 21  U.S.C. section 360bbb-3(b)(1), unless the authorization is  terminated or revoked. Performed at [facility], [place] [address] [place], [place] [address]     Renal Function: Recent Labs    11/27/19 2136 11/28/19 0500 11/28/19 1041 11/29/19 0425  CREATININE 3.08* 3.00* 2.76* 3.49*   Estimated Creatinine Clearance: 13.6 mL/min (A) (by C-G formula based on SCr of 3.49 mg/dL (H)).  Radiologic Imaging: CT L-SPINE NO CHARGE  Result  Date: 11/28/2019 CLINICAL DATA:  Initial evaluation for right flank pain status post recent right renal biopsy. EXAM: CT LUMBAR SPINE WITHOUT CONTRAST TECHNIQUE: Multidetector CT imaging of the lumbar spine was performed without intravenous contrast administration. Multiplanar CT image reconstructions were also generated. COMPARISON:  Concomitant CT of the abdomen and pelvis from the same day. FINDINGS: Segmentation: Standard. Alignment: Physiologic.  No listhesis. Vertebrae: Vertebral body height maintained without acute or chronic fracture. Visualized sacrum and pelvis intact. SI joints approximated symmetric. Small benign bone island noted within the mid sacrum. No other discrete or worrisome osseous lesions. Paraspinal and other soft tissues: Right pleural effusion partially visualized. Large right perinephric hematoma with stranding/hemorrhage within the right retroperitoneal space, partially visualized, better seen on concomitant CT of the abdomen  and pelvis. Moderate aorto bi-iliac atherosclerotic disease. Disc levels: L1-2:  Negative interspace.  Mild facet hypertrophy.  No stenosis. L2-3: Diffuse disc bulge with disc desiccation. Mild reactive endplate change. Mild facet hypertrophy. Resultant mild spinal stenosis. Foramina remain patent. L3-4: Mild diffuse disc bulge. Mild bilateral facet hypertrophy. Probable mild spinal stenosis. Mild bilateral L3 foraminal narrowing. L4-5: Advanced degenerative intervertebral disc space narrowing with ankylosis of the L4 and L5 vertebral bodies, and obliteration of the L4-5 interspace. Associated circumferential marginal endplate spurring. Moderate facet and ligament flavum hypertrophy. Resultant moderate spinal stenosis. Moderate to severe bilateral L4 foraminal narrowing. L5-S1: Mild disc bulge. Severe bilateral facet degeneration, right worse than left. Resultant mild narrowing of the lateral recesses bilaterally. Foramina remain patent. IMPRESSION: 1. No acute abnormality within the lumbar spine. 2. Large right perinephric hematoma with stranding/hemorrhage within the right retroperitoneal space, partially visualized, better evaluated on concomitant CT of the abdomen and pelvis. 3. 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 Resultant moderate spinal stenosis with moderate to severe bilateral L4 foraminal narrowing. 4. Additional mild noncompressive disc bulging elsewhere within the lumbar spine as above. No other significant stenosis or neural impingement. 5. Right pleural effusion, partially visualized. 6. Aortic Atherosclerosis (ICD10-I70.0). Electronically Signed   By: Jeannine Boga M.D.   On: 11/28/2019 01:26   CT Renal Stone Study  Result Date: 11/28/2019 CLINICAL DATA:  Right flank pain recent right kidney biopsy. EXAM: CT ABDOMEN AND PELVIS WITHOUT CONTRAST TECHNIQUE: Multidetector CT imaging of the abdomen and pelvis was performed following the standard  protocol without IV contrast. COMPARISON:  08/14/2017 FINDINGS: Lower chest: Trace right pleural effusion.  Right base atelectasis. Hepatobiliary: No focal hepatic abnormality. Gallbladder unremarkable. Pancreas: No focal abnormality or ductal dilatation. Spleen: No focal abnormality.  Normal size. Adrenals/Urinary Tract: High density large crescentic shaped fluid collection noted around the right kidney compatible with perinephric hematoma, 8.9 x 7.4 cm. This compresses the right kidney. Punctate nonobstructing stone in the upper pole of the right kidney. No hydronephrosis. Hematoma also appears to extend around the right adrenal gland. Stranding also continues in the right retroperitoneum inferior to the right kidney. Left adrenal gland is unremarkable. Urinary bladder unremarkable. Stomach/Bowel: Stomach, large and small bowel grossly unremarkable. Vascular/Lymphatic: Heavily calcified aorta and iliac vessels. No aneurysm or adenopathy. Reproductive: No mass. Other: No free fluid or free air. Musculoskeletal: No acute bony abnormality. IMPRESSION: Large right perinephric hematoma measuring 8.9 x 7.4 cm. This has mass effect on the right kidney. 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 Stranding also continues around the right adrenal gland and inferior to the right kidney in the right retroperitoneum. Trace right pleural effusion.  No pneumothorax. These  results were called by telephone at the time of interpretation on 11/28/2019 at 12:08 am to provider DAN FLOYD , who verbally acknowledged these results. Electronically Signed   By: Rolm Baptise M.D.   On: 11/28/2019 00:12    Impression/Recommendation:  Right renal hematoma--transfuse and continue to monitor as long as pt remains hemodynamically stable.  Review repeat CT.  If she continues to bleed and require future transfusion then would recommend possible embolization.  As per Dr. Kathlene Cote and Dr.Eskridge, want to hold off  on embolization unless absolutely necessary to limit the deleterious effects of contrast and embolization on future kidney function.  Debbrah Alar 11/29/2019, 11:52 AM

## 2019-11-30 DIAGNOSIS — S37019A Minor contusion of unspecified kidney, initial encounter: Secondary | ICD-10-CM | POA: Diagnosis not present

## 2019-11-30 DIAGNOSIS — I10 Essential (primary) hypertension: Secondary | ICD-10-CM | POA: Diagnosis not present

## 2019-11-30 DIAGNOSIS — N179 Acute kidney failure, unspecified: Secondary | ICD-10-CM | POA: Diagnosis not present

## 2019-11-30 DIAGNOSIS — F17200 Nicotine dependence, unspecified, uncomplicated: Secondary | ICD-10-CM

## 2019-11-30 DIAGNOSIS — N184 Chronic kidney disease, stage 4 (severe): Secondary | ICD-10-CM | POA: Diagnosis not present

## 2019-11-30 LAB — CBC WITH DIFFERENTIAL/PLATELET
Abs Immature Granulocytes: 0.06 10*3/uL (ref 0.00–0.07)
Basophils Absolute: 0 10*3/uL (ref 0.0–0.1)
Basophils Relative: 0 %
Eosinophils Absolute: 0.1 10*3/uL (ref 0.0–0.5)
Eosinophils Relative: 1 %
HCT: 25.9 % — ABNORMAL LOW (ref 36.0–46.0)
Hemoglobin: 8.6 g/dL — ABNORMAL LOW (ref 12.0–15.0)
Immature Granulocytes: 1 %
Lymphocytes Relative: 14 %
Lymphs Abs: 1.5 10*3/uL (ref 0.7–4.0)
MCH: 31.4 pg (ref 26.0–34.0)
MCHC: 33.2 g/dL (ref 30.0–36.0)
MCV: 94.5 fL (ref 80.0–100.0)
Monocytes Absolute: 0.9 10*3/uL (ref 0.1–1.0)
Monocytes Relative: 9 %
Neutro Abs: 7.9 10*3/uL — ABNORMAL HIGH (ref 1.7–7.7)
Neutrophils Relative %: 75 %
Platelets: 150 10*3/uL (ref 150–400)
RBC: 2.74 MIL/uL — ABNORMAL LOW (ref 3.87–5.11)
RDW: 15.9 % — ABNORMAL HIGH (ref 11.5–15.5)
WBC: 10.5 10*3/uL (ref 4.0–10.5)
nRBC: 0 % (ref 0.0–0.2)

## 2019-11-30 LAB — BPAM RBC
Blood Product Expiration Date: 202111292359
Blood Product Expiration Date: 202111292359
ISSUE DATE / TIME: 202111051015
ISSUE DATE / TIME: 202111051329
Unit Type and Rh: 6200
Unit Type and Rh: 6200

## 2019-11-30 LAB — BASIC METABOLIC PANEL
Anion gap: 13 (ref 5–15)
BUN: 80 mg/dL — ABNORMAL HIGH (ref 8–23)
CO2: 19 mmol/L — ABNORMAL LOW (ref 22–32)
Calcium: 8.1 mg/dL — ABNORMAL LOW (ref 8.9–10.3)
Chloride: 101 mmol/L (ref 98–111)
Creatinine, Ser: 3.98 mg/dL — ABNORMAL HIGH (ref 0.44–1.00)
GFR, Estimated: 12 mL/min — ABNORMAL LOW (ref >60)
Glucose, Bld: 153 mg/dL — ABNORMAL HIGH (ref 70–99)
Potassium: 4.2 mmol/L (ref 3.5–5.1)
Sodium: 133 mmol/L — ABNORMAL LOW (ref 135–145)

## 2019-11-30 LAB — TYPE AND SCREEN
ABO/RH(D): A POS
Antibody Screen: NEGATIVE
Unit division: 0
Unit division: 0

## 2019-11-30 LAB — GLUCOSE, CAPILLARY
Glucose-Capillary: 156 mg/dL — ABNORMAL HIGH (ref 70–99)
Glucose-Capillary: 140 mg/dL — ABNORMAL HIGH (ref 70–99)
Glucose-Capillary: 98 mg/dL (ref 70–99)
Glucose-Capillary: 251 mg/dL — ABNORMAL HIGH (ref 70–99)

## 2019-11-30 MED ORDER — SODIUM CHLORIDE 0.9 % IV SOLN: INTRAVENOUS | Status: AC

## 2019-11-30 NOTE — Progress Notes (Signed)
Subjective: NAEO. Patient feeling about the same as yesterday. Still with intermittent twinges of right flank pain. Hgb responded appropriately to transfusion and is at 8.6 today. Cr remains elevated at 3.98.  Objective: Vital signs in last 24 hours: Temp:  [98.5 F (36.9 C)-100 F (37.8 C)] 98.8 F (37.1 C) (11/06 0408) Pulse Rate:  [62-87] 87 (11/06 0935) Resp:  [19-20] 20 (11/06 0408) BP: (106-171)/(47-64) 171/60 (11/06 0935) SpO2:  [92 %-99 %] 93 % (11/06 0935) Weight:  [56.8 kg] 56.8 kg (11/06 0408)  Intake/Output from previous day: 11/05 0701 - 11/06 0700 In: 1313.5 [P.O.:360; I.V.:287.5; Blood:666] Out: 1400 [Urine:1400] Intake/Output this shift: No intake/output data recorded.  Physical Exam:  General: Alert and oriented, eating breakfast in bed CV: RRR Lungs: Clear Abdomen: Soft, ND GU: right flank tender to palpation Ext: NT, No erythema  Lab Results: Recent Labs    11/29/19 0425 11/29/19 1944 11/30/19 0538  HGB 5.1* 7.8* 8.6*  HCT 15.8* 22.3* 25.9*   BMET Recent Labs    11/29/19 0425 11/30/19 0538  NA 134* 133*  K 4.6 4.2  CL 100 101  CO2 23 19*  GLUCOSE 179* 153*  BUN 72* 80*  CREATININE 3.49* 3.98*  CALCIUM 7.8* 8.1*     Studies/Results: CT ABDOMEN PELVIS WO CONTRAST  Result Date: 11/29/2019 CLINICAL DATA:  Status post random renal biopsy the right kidney on 25/95/6387 complicated by hemorrhage. Decrease in hemoglobin during admission after detection of post biopsy hemorrhage. EXAM: CT ABDOMEN AND PELVIS WITHOUT CONTRAST TECHNIQUE: Multidetector CT imaging of the abdomen and pelvis was performed following the standard protocol without IV contrast. COMPARISON:  11/27/2019 FINDINGS: Lower chest: Slight increase in a small right pleural effusion with associated right basilar atelectasis. Hepatobiliary: No focal liver abnormality is seen. No gallstones, gallbladder wall thickening, or biliary dilatation. Pancreas: Unremarkable. No pancreatic  ductal dilatation or surrounding inflammatory changes. Spleen: Normal in size without focal abnormality. Adrenals/Urinary Tract: Hyperdense, predominantly subcapsular hemorrhage surrounding the right kidney and predominantly located along the lateral aspect of the kidney shows very slight enlargement since the prior study. Maximal subcapsular hematoma diameter is approximately 9 cm transversely and 12.2 cm in oblique height compared to comparable measurements of 8.8 cm and 12 cm on the prior study. Overall hematoma volume is very similar. There is a small component of additional hemorrhage inferior to the right kidney in the retroperitoneum which appears stable since the prior study. No intraperitoneal hemorrhage identified. Mass effect on the right kidney again noted without evidence of hydronephrosis. The left kidney remains stable and unremarkable in appearance. The bladder is unremarkable. Stomach/Bowel: No evidence of bowel obstruction, ileus or free intraperitoneal air. Vascular/Lymphatic: Stable atherosclerosis of the aorta and iliac arteries. No evidence of aneurysm. No enlarged lymph nodes identified. Reproductive: Status post hysterectomy. No adnexal masses. Other: No abdominal wall hernia or abnormality. No abdominopelvic ascites. Musculoskeletal: No acute or significant osseous findings. IMPRESSION: Essentially stable size of predominately subcapsular hemorrhage surrounding the right kidney with minimal increase in hematoma dimensions of only roughly 2 mm. Hemorrhage remains predominantly confined in a subcapsular location around the right kidney with stable small amount of additional retroperitoneal hemorrhage inferior to the right kidney. Electronically Signed   By: Aletta Edouard M.D.   On: 11/29/2019 12:55   US RENAL  Result Date: 11/29/2019 CLINICAL DATA:  Recent kidney biopsy 11/25/2019. EXAM: RENAL / URINARY TRACT ULTRASOUND COMPLETE COMPARISON:  CT renal 11/27/2019, CT renal 11/29/2019.  FINDINGS: Right Kidney: Renal measurements: 10.8 x 4.4 x  4.8 cm = volume: 117 mL. Echogenicity is increased. There is a subcapsular heterogeneous echogenic fluid collection measuring 9.2 x 3.5 x 7.3 cm. No mass or hydronephrosis visualized. Vascularity noted to the renal parenchyma. Left Kidney: Renal measurements: 9.2 x 5.6 x 4.2 cm = volume: 114 mL. Echogenicity is increased. No mass or hydronephrosis visualized. Bladder: Appears normal for degree of bladder distention. Other: None. IMPRESSION: 1. A grossly unchanged 9.2 x 3.5 x 7.3 cm right subcapsular hematoma. 2. Increased renal parenchymal echogenicity consistent with known chronic renal disease. Electronically Signed   By: Iven Finn M.D.   On: 11/29/2019 20:12    Assessment/Plan: 62 yoF with right renal hematoma s/p US guided bx on 11/1. Responding appropriately to transfusion, stable at this time.  - recommend continuing to monitor H/H until stable - if hemodynamically unstable or continues to require transfusions, would recommend repeat imaging and possible embolization if necessary. - pain control as needed - continue to monitor blood pressure in the setting of possible development of page kidney   LOS: 2 days   Dessiree [facility]ze [facility] 11/30/2019, 9:55 AM

## 2019-11-30 NOTE — Progress Notes (Signed)
[facility] Progress Note  Subjective: creat up 3.5 > 3.9 this am.  Good UOP yest , today 1600 so far. No c/o's this am.  No N/V, confusion or jerking.   Vitals:   11/29/19 1628 11/29/19 2043 11/30/19 0408 11/30/19 0935  BP: (!) 116/57 (!) 124/52 (!) 146/64 (!) 171/60  Pulse: 71 70 81 87  Resp: $Remo'20 20 20   'IATCM$ Temp: 99.6 F (37.6 C) 99.6 F (37.6 C) 98.8 F (37.1 C)   TempSrc: Oral Oral Oral   SpO2: 99% 98% 93% 93%  Weight:   56.8 kg   Height:        Exam: Gen alert, wdwn, no distress Sclera anicteric, throat clear  Chest clear bilat, no rales or wheezing RRR 2/6 sem , noRG Abd soft ntnd no mass or ascites +bs, no CVAT Ext no edema  Neuro is alert, Ox 3 , nf    Date                Creat               eGFR ml/min  2008-2014      0.7- 0.8  2015- 16         1.31- 2.24  2017- 18         1.18- 1.88  2019- 2020     2.03- 2.70        20- 31, CKD IV  aug 2021        2.09  11/3 (admit) 3.08   Home meds:  - norvasc 10/ clonidine 0.2 tid/ metoprolol xl 100 qd/ minoxidil 2.5 tid/ telmisartan-hctz 80-25 qd/ lasix 40 mg qd prn  - repaglinide $RemoveBefor'2mg'HFeiXQceVQmr$  tid/ bromocriptine 2.5 qd' protonix qd  - asa 325/ lipitor 80  - prn's/ vitamins/ supplements    UA 11/3 - >300 protein, 21-50 rbc, 0-5 wbc, rare bact    CT 11/3 - Adrenals/Urinary Tract: High density large crescentic shaped fluid collection noted around right kidney compatible with perinephric hematoma, 8.9 x 7.4 cm. This compresses the right kidney. Punctate nonobstructing stone in the upper pole of the right kidney. No hydronephrosis. Hematoma also appears to extend around the right adrenal gland. Stranding also continues in the right retroperitoneum inferior to the right kidney. Left adrenal gland is unremarkable. Urinary bladder unremarkable.  Assessment/ Plan: 1. Uncontrolled HTN - resolved w/ reg home meds + IV acei. Cont current meds. Cont to hold home ARB, norvasc/ hctz. IV acei dc'd d/t rising creat.  2. AoCKD 4 -  b/l creat 2.0- 2.7, eGFR 20- 31 ml/min. 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 Creat up 3.9 today, no signs of uremia, no indication for HD at this time. Will start gentle IVF"s. Labs in am. 3. Intrarenal bleed - sp renal biopsy on 11/01, subcapsular bleed, f/u CT and US show stable hematoma, slight increase.  Hb down to 5's and got 2u on 11/5, Hb much better today at 8.6.  4. IDDM - per pmd   Kelly Splinter, MD 11/30/2019, 12:23 PM   Recent Labs  Lab 11/29/19 0425 11/29/19 0425 11/29/19 1944 11/30/19 0538  K 4.6  --   --  4.2  BUN 72*  --   --  80*  CREATININE 3.49*  --   --  3.98*  CALCIUM 7.8*  --   --  8.1*  HGB 5.1*   < > 7.8* 8.6*   < > = values in  this interval not displayed.   Inpatient medications:  atorvastatin  80 mg Oral Daily   bromocriptine  1.25 mg Oral Daily   cloNIDine  0.2 mg Oral TID   insulin aspart  0-15 Units Subcutaneous TID WC   insulin aspart  0-5 Units Subcutaneous QHS   metoprolol tartrate  50 mg Oral BID   minoxidil  2.5 mg Oral TID   pantoprazole  40 mg Oral Daily    acetaminophen **OR** acetaminophen, alum & mag hydroxide-simeth, hydrALAZINE, HYDROcodone-acetaminophen, morphine injection, ondansetron **OR** ondansetron (ZOFRAN) IV, polyethylene glycol

## 2019-11-30 NOTE — Progress Notes (Signed)
  Chart check-- IR  History of proteinuria s/p right random renal biopsy in IR 12/29/2710; complicated by development of right flank pain and hypertension secondary to right perinephric hematoma.  Hg 8.6 this am-- stable Last transfused 11/5 am  171/60; 93; 87; 98%  Will follow

## 2019-11-30 NOTE — Progress Notes (Signed)
Patient ID: [name], female   DOB: January 10, 1951, 69 y.o.   MRN: [id number]  PROGRESS NOTE    ARDRA KUZNICKI  GMW:[id number] DOB: Jun 06, 1950 DOA: 11/27/2019 PCP: Marrian Salvage, FNP   Brief Narrative:  69 year old female with history of hypertension, hypothyroidism, diabetes mellitus type 2, diverticulosis and tobacco dependence, recent right kidney biopsy on 11/25/2019 for proteinuria as per nephrology recommendations presented on 11/26/2019 with right flank pain.  On presentation, blood pressure was 220/87; hemoglobin of 9.1, creatinine of 3.08.  CT of the abdomen/pelvis showed a large perinephric hematoma with some compression of kidney.  IR and nephrology were consulted.  Assessment & Plan:   Perinephric hematoma Acute blood loss anemia -Patient had a recent right kidney biopsy on 11/25/2019 and presented with a large right perinephric hematoma with some compression of kidney.  IR and nephrology were consulted and currently being managed conservatively.  Hemoglobin 9.1 on presentation.  Hemoglobin 5.1 on 11/29/2019.  Status post 2 units packed red cell transfusion on 11/29/2019.  Hemoglobin 8.6 this morning. -Urology and IR following.  Repeat CT of the abdomen and pelvis on 11/29/2019 showed stable subcapsular hemorrhage surrounding the right kidney with minimal increase in hematoma dimensions of only roughly 2 mm -DC'd antibiotics on 11/29/2019  Acute kidney injury on chronic kidney disease stage IV -Baseline creatinine between 2-2.7.  Nephrology following.  Creatinine 3.98 today.  Continue monitoring.  IV Lasix has been discontinued by nephrology.  Hypertension uncontrolled -Blood pressure improving.  Continue clonidine, metoprolol, minoxidil.    Hyperlipidemia -continue statin  Diabetes mellitus type 2 with hyperglycemia -A1c 8.  Continue CBGs with SSI   DVT prophylaxis: Heparin discontinued because of hematoma.  SCDs Code Status: Full Family Communication: None at  bedside Disposition Plan: Status is: Inpatient  Remains inpatient appropriate because:Inpatient level of care appropriate due to severity of illness   Dispo: The patient is from: Home              Anticipated d/c is to: Home              Anticipated d/c date is: 2 days              Patient currently is not medically stable to d/c.  Consultants: Nephrology/urology/IR  Procedures: None  Antimicrobials:  Anti-infectives (From admission, onward)   Start     Dose/Rate Route Frequency Ordered Stop   11/28/19 0215  cefTRIAXone (ROCEPHIN) 1 g in sodium chloride 0.9 % 100 mL IVPB  Status:  Discontinued        1 g 200 mL/hr over 30 Minutes Intravenous Every 24 hours 11/28/19 0208 11/29/19 1000       Subjective: Patient seen and examined at bedside.  Denies worsening abdominal or flank pain.  Denies worsening shortness of breath.  No overnight fever, vomiting, chest pain reported. Objective: Vitals:   11/29/19 1405 11/29/19 1628 11/29/19 2043 11/30/19 0408  BP: (!) 132/56 (!) 116/57 (!) 124/52 (!) 146/64  Pulse: 72 71 70 81  Resp: 19 20 20 20   Temp: 100 F (37.8 C) 99.6 F (37.6 C) 99.6 F (37.6 C) 98.8 F (37.1 C)  TempSrc: Oral Oral Oral Oral  SpO2: 96% 99% 98% 93%  Weight:    56.8 kg  Height:        Intake/Output Summary (Last 24 hours) at 11/30/2019 0833 Last data filed at 11/30/2019 0600 Gross per 24 hour  Intake 1313.5 ml  Output 1400 ml  Net -86.5 ml   Filed  Weights   11/27/19 2131 11/28/19 1240 11/30/19 0408  Weight: 62.6 kg 66.5 kg 56.8 kg    Examination:  General exam: No distress.  Elderly female sitting on chair.  Poor historian.  Respiratory system: Bilateral decreased breath sounds at bases with some crackles, no wheezing  cardiovascular system: Rate controlled, S1-S2 heard  gastrointestinal system: Abdomen is nondistended, soft and nontender.  Bowel sounds are heard  genitourinary: Slight right flank tenderness present Extremities: No lower extremity  edema, no cyanosis  Central nervous system: Awake and alert.  Poor historian.  No focal neurological deficits.  Moves extremities Skin: No obvious ecchymosis/lesions Psychiatry: Affect is flat   Data Reviewed: I have personally reviewed following labs and imaging studies  CBC: Recent Labs  Lab 11/27/19 2136 11/27/19 2136 11/28/19 0500 11/28/19 1041 11/29/19 0425 11/29/19 1944 11/30/19 0538  WBC 7.1  --  9.7  --  8.6 9.9 10.5  NEUTROABS  --   --   --   --  6.2  --  7.9*  HGB 9.1*   < > 7.1* 7.2* 5.1* 7.8* 8.6*  HCT 27.6*   < > 22.3* 22.2* 15.8* 22.3* 25.9*  MCV 98.2  --  99.1  --  98.1 97.0 94.5  PLT 223  --  190  --  160 135* 150   < > = values in this interval not displayed.   Basic Metabolic Panel: Recent Labs  Lab 11/27/19 2136 11/28/19 0500 11/28/19 1041 11/29/19 0425 11/30/19 0538  NA 133* 135 134* 134* 133*  K 3.7 4.5 4.4 4.6 4.2  CL 101 102 101 100 101  CO2 24 22 22 23  19*  GLUCOSE 262* 323* 243* 179* 153*  BUN 67* 65* 66* 72* 80*  CREATININE 3.08* 3.00* 2.76* 3.49* 3.98*  CALCIUM 8.4* 8.0* 8.1* 7.8* 8.1*  MG  --   --   --  2.4  --    GFR: Estimated Creatinine Clearance: 10.6 mL/min (A) (by C-G formula based on SCr of 3.98 mg/dL (H)). Liver Function Tests: Recent Labs  Lab 11/28/19 0500  AST 27  ALT 29  ALKPHOS 87  BILITOT 0.7  PROT 6.4*  ALBUMIN 2.8*   No results for input(s): LIPASE, AMYLASE in the last 168 hours. No results for input(s): AMMONIA in the last 168 hours. Coagulation Profile: Recent Labs  Lab 11/25/19 0556  INR 1.0   Cardiac Enzymes: No results for input(s): CKTOTAL, CKMB, CKMBINDEX, TROPONINI in the last 168 hours. BNP (last 3 results) No results for input(s): PROBNP in the last 8760 hours. HbA1C: Recent Labs    11/28/19 0500 11/29/19 0425  HGBA1C 8.3* 8.0*   CBG: Recent Labs  Lab 11/29/19 0740 11/29/19 1203 11/29/19 1647 11/29/19 2040 11/30/19 0744  GLUCAP 153* 120* 125* 147* 156*   Lipid Profile: No  results for input(s): CHOL, HDL, LDLCALC, TRIG, CHOLHDL, LDLDIRECT in the last 72 hours. Thyroid Function Tests: No results for input(s): TSH, T4TOTAL, FREET4, T3FREE, THYROIDAB in the last 72 hours. Anemia Panel: No results for input(s): VITAMINB12, FOLATE, FERRITIN, TIBC, IRON, RETICCTPCT in the last 72 hours. Sepsis Labs: No results for input(s): PROCALCITON, LATICACIDVEN in the last 168 hours.  Recent Results (from the past 240 hour(s))  Respiratory Panel by RT PCR (Flu A&B, Covid) - Nasopharyngeal Swab     Status: None   Collection Time: 11/28/19  4:55 AM   Specimen: Nasopharyngeal Swab  Result Value Ref Range Status   SARS Coronavirus 2 by RT PCR NEGATIVE NEGATIVE Final  Comment: (NOTE) SARS-CoV-2 target nucleic acids are NOT DETECTED.  The SARS-CoV-2 RNA is generally detectable in upper respiratoy specimens during the acute phase of infection. The lowest concentration of SARS-CoV-2 viral copies this assay can detect is 131 copies/mL. A negative result does not preclude SARS-Cov-2 infection and should not be used as the sole basis for treatment or other patient management decisions. A negative result may occur with  improper specimen collection/handling, submission of specimen other than nasopharyngeal swab, presence of viral mutation(s) within the areas targeted by this assay, and inadequate number of viral copies (<131 copies/mL). A negative result must be combined with clinical observations, patient history, and epidemiological information. The expected result is Negative.  Fact Sheet for Patients:  PinkCheek.be  Fact Sheet for Healthcare Providers:  GravelBags.it  This test is no t yet approved or cleared by the [place] FDA and  has been authorized for detection and/or diagnosis of SARS-CoV-2 by FDA under an Emergency Use Authorization (EUA). This EUA will remain  in effect (meaning this test can be  used) for the duration of the COVID-19 declaration under Section 564(b)(1) of the Act, 21 U.S.C. section 360bbb-3(b)(1), unless the authorization is terminated or revoked sooner.     Influenza A by PCR NEGATIVE NEGATIVE Final   Influenza B by PCR NEGATIVE NEGATIVE Final    Comment: (NOTE) The Xpert Xpress SARS-CoV-2/FLU/RSV assay is intended as an aid in  the diagnosis of influenza from Nasopharyngeal swab specimens and  should not be used as a sole basis for treatment. Nasal washings and  aspirates are unacceptable for Xpert Xpress SARS-CoV-2/FLU/RSV  testing.  Fact Sheet for Patients: PinkCheek.be  Fact Sheet for Healthcare Providers: GravelBags.it  This test is not yet approved or cleared by the [place] FDA and  has been authorized for detection and/or diagnosis of SARS-CoV-2 by  FDA under an Emergency Use Authorization (EUA). This EUA will remain  in effect (meaning this test can be used) for the duration of the  Covid-19 declaration under Section 564(b)(1) of the Act, 21  U.S.C. section 360bbb-3(b)(1), unless the authorization is  terminated or revoked. Performed at [facility], [place] [address] [place], [place] [address]          Radiology Studies: CT ABDOMEN PELVIS WO CONTRAST  Result Date: 11/29/2019 CLINICAL DATA:  Status post random renal biopsy the right kidney on 33/82/5053 complicated by hemorrhage. Decrease in hemoglobin during admission after detection of post biopsy hemorrhage. EXAM: CT ABDOMEN AND PELVIS WITHOUT CONTRAST TECHNIQUE: Multidetector CT imaging of the abdomen and pelvis was performed following the standard protocol without IV contrast. COMPARISON:  11/27/2019 FINDINGS: Lower chest: Slight increase in a small right pleural effusion with associated right basilar atelectasis. Hepatobiliary: No focal liver abnormality is seen. No gallstones, gallbladder wall thickening,  or biliary dilatation. Pancreas: Unremarkable. No pancreatic ductal dilatation or surrounding inflammatory changes. Spleen: Normal in size without focal abnormality. Adrenals/Urinary Tract: Hyperdense, predominantly subcapsular hemorrhage surrounding the right kidney and predominantly located along the lateral aspect of the kidney shows very slight enlargement since the prior study. Maximal subcapsular hematoma diameter is approximately 9 cm transversely and 12.2 cm in oblique height compared to comparable measurements of 8.8 cm and 12 cm on the prior study. Overall hematoma volume is very similar. There is a small component of additional hemorrhage inferior to the right kidney in the retroperitoneum which appears stable since the prior study. No intraperitoneal hemorrhage identified. Mass effect on the right kidney again noted without  evidence of hydronephrosis. The left kidney remains stable and unremarkable in appearance. The bladder is unremarkable. Stomach/Bowel: No evidence of bowel obstruction, ileus or free intraperitoneal air. Vascular/Lymphatic: Stable atherosclerosis of the aorta and iliac arteries. No evidence of aneurysm. No enlarged lymph nodes identified. Reproductive: Status post hysterectomy. No adnexal masses. Other: No abdominal wall hernia or abnormality. No abdominopelvic ascites. Musculoskeletal: No acute or significant osseous findings. IMPRESSION: Essentially stable size of predominately subcapsular hemorrhage surrounding the right kidney with minimal increase in hematoma dimensions of only roughly 2 mm. Hemorrhage remains predominantly confined in a subcapsular location around the right kidney with stable small amount of additional retroperitoneal hemorrhage inferior to the right kidney. Electronically Signed   By: Aletta Edouard M.D.   On: 11/29/2019 12:55   US RENAL  Result Date: 11/29/2019 CLINICAL DATA:  Recent kidney biopsy 11/25/2019. EXAM: RENAL / URINARY TRACT ULTRASOUND  COMPLETE COMPARISON:  CT renal 11/27/2019, CT renal 11/29/2019. FINDINGS: Right Kidney: Renal measurements: 10.8 x 4.4 x 4.8 cm = volume: 117 mL. Echogenicity is increased. There is a subcapsular heterogeneous echogenic fluid collection measuring 9.2 x 3.5 x 7.3 cm. No mass or hydronephrosis visualized. Vascularity noted to the renal parenchyma. Left Kidney: Renal measurements: 9.2 x 5.6 x 4.2 cm = volume: 114 mL. Echogenicity is increased. No mass or hydronephrosis visualized. Bladder: Appears normal for degree of bladder distention. Other: None. IMPRESSION: 1. A grossly unchanged 9.2 x 3.5 x 7.3 cm right subcapsular hematoma. 2. Increased renal parenchymal echogenicity consistent with known chronic renal disease. Electronically Signed   By: Iven Finn M.D.   On: 11/29/2019 20:12        Scheduled Meds: . atorvastatin  80 mg Oral Daily  . bromocriptine  1.25 mg Oral Daily  . cloNIDine  0.2 mg Oral TID  . insulin aspart  0-15 Units Subcutaneous TID WC  . insulin aspart  0-5 Units Subcutaneous QHS  . metoprolol tartrate  50 mg Oral BID  . minoxidil  2.5 mg Oral TID  . pantoprazole  40 mg Oral Daily   Continuous Infusions:        Aline August, MD [facility] 11/30/2019, 8:33 AM

## 2019-11-30 NOTE — Plan of Care (Signed)

## 2019-12-01 ENCOUNTER — Inpatient Hospital Stay (HOSPITAL_COMMUNITY): Payer: Medicare Other

## 2019-12-01 DIAGNOSIS — F17200 Nicotine dependence, unspecified, uncomplicated: Secondary | ICD-10-CM | POA: Diagnosis not present

## 2019-12-01 DIAGNOSIS — I10 Essential (primary) hypertension: Secondary | ICD-10-CM | POA: Diagnosis not present

## 2019-12-01 DIAGNOSIS — E1165 Type 2 diabetes mellitus with hyperglycemia: Secondary | ICD-10-CM | POA: Diagnosis not present

## 2019-12-01 DIAGNOSIS — S37019A Minor contusion of unspecified kidney, initial encounter: Secondary | ICD-10-CM | POA: Diagnosis not present

## 2019-12-01 LAB — CBC WITH DIFFERENTIAL/PLATELET
Abs Immature Granulocytes: 0.05 10*3/uL (ref 0.00–0.07)
Basophils Absolute: 0 10*3/uL (ref 0.0–0.1)
Basophils Relative: 0 %
Eosinophils Absolute: 0.2 10*3/uL (ref 0.0–0.5)
Eosinophils Relative: 2 %
HCT: 22.2 % — ABNORMAL LOW (ref 36.0–46.0)
Hemoglobin: 7.5 g/dL — ABNORMAL LOW (ref 12.0–15.0)
Immature Granulocytes: 1 %
Lymphocytes Relative: 11 %
Lymphs Abs: 1.1 10*3/uL (ref 0.7–4.0)
MCH: 32.9 pg (ref 26.0–34.0)
MCHC: 33.8 g/dL (ref 30.0–36.0)
MCV: 97.4 fL (ref 80.0–100.0)
Monocytes Absolute: 0.7 10*3/uL (ref 0.1–1.0)
Monocytes Relative: 7 %
Neutro Abs: 7.9 10*3/uL — ABNORMAL HIGH (ref 1.7–7.7)
Neutrophils Relative %: 79 %
Platelets: 147 10*3/uL — ABNORMAL LOW (ref 150–400)
RBC: 2.28 MIL/uL — ABNORMAL LOW (ref 3.87–5.11)
RDW: 15.2 % (ref 11.5–15.5)
WBC: 10 10*3/uL (ref 4.0–10.5)
nRBC: 0 % (ref 0.0–0.2)

## 2019-12-01 LAB — BASIC METABOLIC PANEL
Anion gap: 10 (ref 5–15)
BUN: 84 mg/dL — ABNORMAL HIGH (ref 8–23)
CO2: 20 mmol/L — ABNORMAL LOW (ref 22–32)
Calcium: 7.7 mg/dL — ABNORMAL LOW (ref 8.9–10.3)
Chloride: 104 mmol/L (ref 98–111)
Creatinine, Ser: 3.73 mg/dL — ABNORMAL HIGH (ref 0.44–1.00)
GFR, Estimated: 13 mL/min — ABNORMAL LOW (ref >60)
Glucose, Bld: 178 mg/dL — ABNORMAL HIGH (ref 70–99)
Potassium: 4.2 mmol/L (ref 3.5–5.1)
Sodium: 134 mmol/L — ABNORMAL LOW (ref 135–145)

## 2019-12-01 LAB — GLUCOSE, CAPILLARY
Glucose-Capillary: 154 mg/dL — ABNORMAL HIGH (ref 70–99)
Glucose-Capillary: 206 mg/dL — ABNORMAL HIGH (ref 70–99)
Glucose-Capillary: 203 mg/dL — ABNORMAL HIGH (ref 70–99)
Glucose-Capillary: 265 mg/dL — ABNORMAL HIGH (ref 70–99)

## 2019-12-01 NOTE — Plan of Care (Signed)

## 2019-12-01 NOTE — Evaluation (Signed)
Physical Therapy Evaluation Patient Details Name: [name] MRN: [id number] DOB: 05-24-1950 Today's Date: 12/01/2019   History of Present Illness  69 year old female with history of hypertension, hypothyroidism, diabetes mellitus type 2, diverticulosis and tobacco dependence, recent right kidney biopsy on 11/25/2019 for proteinuria as per nephrology recommendations presented on 11/26/2019 with right flank pain.  On presentation, blood pressure was 220/87; hemoglobin of 9.1, creatinine of 3.08.  CT of the abdomen/pelvis showed a large perinephric hematoma with some compression of kidney  Clinical Impression  Patient resting in bed, while attempting mobility, patient noted to have edema and redness, and pain to touch the left leg, expecially the left knee. Patient states that she had a fall to cement PTA . Patient's CNA reports that patient has been being assisted to [facility] but has had much difficulty and complains of L leg pain. Patient was unable to safely stand  This visit, noted legs buckling when attempted  To stand and pivot with this therapist.  Pt admitted with above diagnosis.   Pt currently with functional limitations due to the deficits listed below (see PT Problem List). Pt will benefit from skilled PT to increase their independence and safety with mobility to allow discharge to the venue listed below.       Follow Up Recommendations SNF;Supervision/Assistance - 24 hour    Equipment Recommendations  Rolling walker with 5" wheels    Recommendations for Other Services OT consult     Precautions / Restrictions Precautions Precautions: Fall Precaution Comments: L LEG EDEMA, PAIN ABOUT THE left KNEE.      Mobility  Bed Mobility Overal bed mobility: Needs Assistance Bed Mobility: Supine to Sit;Sit to Supine     Supine to sit: Mod assist Sit to supine: Mod assist   General bed mobility comments: multimodal cues to initiate moving legs, mod assist to move LLE and sit upright  and to scoot to bed edge, Patient has flexed posture.    Transfers Overall transfer level: Needs assistance Equipment used: Rolling walker (2 wheeled) Transfers: Sit to/from [facility] Sit to Stand: Max assist         General transfer comment: attempted  to stand a RW x 2, patient unable to bear weight on LLE, attempted to transfer  "bear hug" to recliner, patient's legs collapsing so assisted back into bed.  Ambulation/Gait                Stairs            Wheelchair Mobility    Modified Rankin (Stroke Patients Only)       Balance Overall balance assessment: Needs assistance;History of Falls Sitting-balance support: Bilateral upper extremity supported;Feet supported Sitting balance-Leahy Scale: Fair     Standing balance support: During functional activity;Bilateral upper extremity supported Standing balance-Leahy Scale: Zero                               Pertinent Vitals/Pain Pain Assessment: Faces Faces Pain Scale: Hurts worst Pain Location: could not get pt to rate, Pain Descriptors / Indicators: Grimacing;Guarding;Moaning Pain Intervention(s): Monitored during session    Home Living Family/patient expects to be discharged to:: Private residence Living Arrangements: Alone Available Help at Discharge: Family;Available PRN/intermittently Type of Home: House Home Access: Level entry     Home Layout: One level Home Equipment: Cane - single point      Prior Function Level of Independence: Independent with assistive device(s)  Hand Dominance   Dominant Hand: Right    Extremity/Trunk Assessment   Upper Extremity Assessment Upper Extremity Assessment: Generalized weakness    Lower Extremity Assessment Lower Extremity Assessment: LLE deficits/detail LLE Deficits / Details: lack 20 degrees of extension of knee, noted leg from thigh to foot to be edematous and   warm to touch. Patient only  tolerated  about 50 * knee flexion when seated. Patient moans  when palpating the knee, appears to have effusion. LLE: Unable to fully assess due to pain    Cervical / Trunk Assessment Cervical / Trunk Assessment: Other exceptions Cervical / Trunk Exceptions: in seitting, keeps head down, does not seit erect.  Communication   Communication: No difficulties (voice very quiet, decreased effort)  Cognition Arousal/Alertness: Awake/alert Behavior During Therapy: Flat affect Overall Cognitive Status: No family/caregiver present to determine baseline cognitive functioning Area of Impairment: Following commands                       Following Commands: Follows one step commands with increased time       General Comments: decreased  initiattion, required several cues to mobilize.      General Comments      Exercises     Assessment/Plan    PT Assessment Patient needs continued PT services  PT Problem List Decreased strength;Decreased cognition;Decreased knowledge of use of DME;Decreased range of motion;Decreased activity tolerance;Decreased safety awareness;Decreased balance;Decreased knowledge of precautions;Decreased mobility;Pain       PT Treatment Interventions DME instruction;Gait training;Functional mobility training;Therapeutic activities;Therapeutic exercise;Patient/family education    PT Goals (Current goals can be found in the Care Plan section)  Acute Rehab PT Goals Patient Stated Goal: agreed to get up PT Goal Formulation: With patient Time For Goal Achievement: 12/15/19 Potential to Achieve Goals: Fair    Frequency Min 3X/week (chg if SNF bound)   Barriers to discharge Decreased caregiver support      Co-evaluation               AM-PAC PT "6 Clicks" Mobility  Outcome Measure Help needed turning from your back to your side while in a flat bed without using bedrails?: A Lot Help needed moving from lying on your back to sitting on the side of a  flat bed without using bedrails?: A Lot Help needed moving to and from a bed to a chair (including a wheelchair)?: Total Help needed standing up from a chair using your arms (e.g., wheelchair or bedside chair)?: Total Help needed to walk in hospital room?: Total Help needed climbing 3-5 steps with a railing? : Total 6 Click Score: 8    End of Session Equipment Utilized During Treatment: Gait belt Activity Tolerance: Patient limited by pain;Patient limited by fatigue Patient left: in bed;with call bell/phone within reach Nurse Communication: Mobility status (left leg edema and pain) PT Visit Diagnosis: Unsteadiness on feet (R26.81);Difficulty in walking, not elsewhere classified (R26.2);Pain Pain - Right/Left: Left Pain - part of body: Knee    Time: 1139-1201 PT Time Calculation (min) (ACUTE ONLY): 22 min   Charges:   PT Evaluation $PT Eval Low Complexity: [facility] Acute Rehabilitation Services Pager [phone] Office [phone]   Claretha Cooper 12/01/2019, 1:25 PM

## 2019-12-01 NOTE — Progress Notes (Signed)
Patient ID: [name], female   DOB: 02/21/1950, 69 y.o.   MRN: [id number]  PROGRESS NOTE    [name]  WUJ:[id number] DOB: April 14, 1950 DOA: 11/27/2019 PCP: Marrian Salvage, FNP   Brief Narrative:  69 year old female with history of hypertension, hypothyroidism, diabetes mellitus type 2, diverticulosis and tobacco dependence, recent right kidney biopsy on 11/25/2019 for proteinuria as per nephrology recommendations presented on 11/26/2019 with right flank pain.  On presentation, blood pressure was 220/87; hemoglobin of 9.1, creatinine of 3.08.  CT of the abdomen/pelvis showed a large perinephric hematoma with some compression of kidney.  IR and nephrology were consulted.  Assessment & Plan:   Perinephric hematoma Acute blood loss anemia -Patient had a recent right kidney biopsy on 11/25/2019 and presented with a large right perinephric hematoma with some compression of kidney.  IR and nephrology were consulted and currently being managed conservatively.  Hemoglobin 9.1 on presentation.  Hemoglobin 5.1 on 11/29/2019.  Status post 2 units packed red cell transfusion on 11/29/2019.  Hemoglobin 7.5 this morning. -Urology and IR following.  Repeat CT of the abdomen and pelvis on 11/29/2019 showed stable subcapsular hemorrhage surrounding the right kidney with minimal increase in hematoma dimensions of only roughly 2 mm -DC'd antibiotics on 11/29/2019  Acute kidney injury on chronic kidney disease stage IV -Baseline creatinine between 2-2.7.  Nephrology following.  Creatinine 3.73 today.  Continue monitoring.  IV Lasix has been discontinued by nephrology.  Hypertension uncontrolled -Blood pressure improving.  Continue clonidine, metoprolol, minoxidil.    Thrombocytopenia -Monitor.  Mild hyponatremia -Monitor  Hyperlipidemia -continue statin  Diabetes mellitus type 2 with hyperglycemia -A1c 8.  Continue CBGs with SSI   DVT prophylaxis: Heparin discontinued because of hematoma.   SCDs Code Status: Full Family Communication: None at bedside Disposition Plan: Status is: Inpatient  Remains inpatient appropriate because:Inpatient level of care appropriate due to severity of illness   Dispo: The patient is from: Home              Anticipated d/c is to: Home              Anticipated d/c date is: 1 day              Patient currently is not medically stable to d/c.  Consultants: Nephrology/urology/IR  Procedures: None  Antimicrobials:  Anti-infectives (From admission, onward)   Start     Dose/Rate Route Frequency Ordered Stop   11/28/19 0215  cefTRIAXone (ROCEPHIN) 1 g in sodium chloride 0.9 % 100 mL IVPB  Status:  Discontinued        1 g 200 mL/hr over 30 Minutes Intravenous Every 24 hours 11/28/19 0208 11/29/19 1000       Subjective: Patient seen and examined at bedside.  Poor historian.  No worsening shortness of breath, right flank pain, nausea or vomiting reported.   Objective: Vitals:   11/30/19 1342 11/30/19 1708 11/30/19 2024 12/01/19 0607  BP: (!) 143/65 137/79 (!) 141/51 (!) 136/54  Pulse: 77  76 75  Resp: (!) 21  20 20   Temp: 98.9 F (37.2 C)  99.5 F (37.5 C) 98.5 F (36.9 C)  TempSrc: Oral  Oral   SpO2: 91%  94% 95%  Weight:      Height:        Intake/Output Summary (Last 24 hours) at 12/01/2019 0812 Last data filed at 12/01/2019 0240 Gross per 24 hour  Intake 696.42 ml  Output 590 ml  Net 106.42 ml   Danley Danker  Weights   11/27/19 2131 11/28/19 1240 11/30/19 0408  Weight: 62.6 kg 66.5 kg 56.8 kg    Examination:  General exam: Poor historian.  No acute distress.  Elderly female lying in bed.  Respiratory system: Bilateral decreased breath sounds at bases with scattered crackles  cardiovascular system: S1-S2 heard, rate controlled gastrointestinal system: Abdomen is nondistended, soft and nontender.  Normal bowel sounds heard  genitourinary: Right flank still mildly tender Extremities: No clubbing.  Trace lower extremity mild  present  Central nervous system: Alert and oriented.  No focal neurological deficits.  Moving extremities.  Poor historian  skin: No obvious petechiae/rashes  psychiatry: Flat affect   Data Reviewed: I have personally reviewed following labs and imaging studies  CBC: Recent Labs  Lab 11/28/19 0500 11/28/19 0500 11/28/19 1041 11/29/19 0425 11/29/19 1944 11/30/19 0538 12/01/19 0538  WBC 9.7  --   --  8.6 9.9 10.5 10.0  NEUTROABS  --   --   --  6.2  --  7.9* 7.9*  HGB 7.1*   < > 7.2* 5.1* 7.8* 8.6* 7.5*  HCT 22.3*   < > 22.2* 15.8* 22.3* 25.9* 22.2*  MCV 99.1  --   --  98.1 97.0 94.5 97.4  PLT 190  --   --  160 135* 150 147*   < > = values in this interval not displayed.   Basic Metabolic Panel: Recent Labs  Lab 11/28/19 0500 11/28/19 1041 11/29/19 0425 11/30/19 0538 12/01/19 0538  NA 135 134* 134* 133* 134*  K 4.5 4.4 4.6 4.2 4.2  CL 102 101 100 101 104  CO2 22 22 23  19* 20*  GLUCOSE 323* 243* 179* 153* 178*  BUN 65* 66* 72* 80* 84*  CREATININE 3.00* 2.76* 3.49* 3.98* 3.73*  CALCIUM 8.0* 8.1* 7.8* 8.1* 7.7*  MG  --   --  2.4  --   --    GFR: Estimated Creatinine Clearance: 11.3 mL/min (A) (by C-G formula based on SCr of 3.73 mg/dL (H)). Liver Function Tests: Recent Labs  Lab 11/28/19 0500  AST 27  ALT 29  ALKPHOS 87  BILITOT 0.7  PROT 6.4*  ALBUMIN 2.8*   No results for input(s): LIPASE, AMYLASE in the last 168 hours. No results for input(s): AMMONIA in the last 168 hours. Coagulation Profile: Recent Labs  Lab 11/25/19 0556  INR 1.0   Cardiac Enzymes: No results for input(s): CKTOTAL, CKMB, CKMBINDEX, TROPONINI in the last 168 hours. BNP (last 3 results) No results for input(s): PROBNP in the last 8760 hours. HbA1C: Recent Labs    11/29/19 0425  HGBA1C 8.0*   CBG: Recent Labs  Lab 11/29/19 2040 11/30/19 0744 11/30/19 1133 11/30/19 1634 11/30/19 2022  GLUCAP 147* 156* 140* 98 251*   Lipid Profile: No results for input(s): CHOL, HDL,  LDLCALC, TRIG, CHOLHDL, LDLDIRECT in the last 72 hours. Thyroid Function Tests: No results for input(s): TSH, T4TOTAL, FREET4, T3FREE, THYROIDAB in the last 72 hours. Anemia Panel: No results for input(s): VITAMINB12, FOLATE, FERRITIN, TIBC, IRON, RETICCTPCT in the last 72 hours. Sepsis Labs: No results for input(s): PROCALCITON, LATICACIDVEN in the last 168 hours.  Recent Results (from the past 240 hour(s))  Respiratory Panel by RT PCR (Flu A&B, Covid) - Nasopharyngeal Swab     Status: None   Collection Time: 11/28/19  4:55 AM   Specimen: Nasopharyngeal Swab  Result Value Ref Range Status   SARS Coronavirus 2 by RT PCR NEGATIVE NEGATIVE Final    Comment: (NOTE)  SARS-CoV-2 target nucleic acids are NOT DETECTED.  The SARS-CoV-2 RNA is generally detectable in upper respiratoy specimens during the acute phase of infection. The lowest concentration of SARS-CoV-2 viral copies this assay can detect is 131 copies/mL. A negative result does not preclude SARS-Cov-2 infection and should not be used as the sole basis for treatment or other patient management decisions. A negative result may occur with  improper specimen collection/handling, submission of specimen other than nasopharyngeal swab, presence of viral mutation(s) within the areas targeted by this assay, and inadequate number of viral copies (<131 copies/mL). A negative result must be combined with clinical observations, patient history, and epidemiological information. The expected result is Negative.  Fact Sheet for Patients:  PinkCheek.be  Fact Sheet for Healthcare Providers:  GravelBags.it  This test is no t yet approved or cleared by the [place] FDA and  has been authorized for detection and/or diagnosis of SARS-CoV-2 by FDA under an Emergency Use Authorization (EUA). This EUA will remain  in effect (meaning this test can be used) for the duration of  the COVID-19 declaration under Section 564(b)(1) of the Act, 21 U.S.C. section 360bbb-3(b)(1), unless the authorization is terminated or revoked sooner.     Influenza A by PCR NEGATIVE NEGATIVE Final   Influenza B by PCR NEGATIVE NEGATIVE Final    Comment: (NOTE) The Xpert Xpress SARS-CoV-2/FLU/RSV assay is intended as an aid in  the diagnosis of influenza from Nasopharyngeal swab specimens and  should not be used as a sole basis for treatment. Nasal washings and  aspirates are unacceptable for Xpert Xpress SARS-CoV-2/FLU/RSV  testing.  Fact Sheet for Patients: PinkCheek.be  Fact Sheet for Healthcare Providers: GravelBags.it  This test is not yet approved or cleared by the [place] FDA and  has been authorized for detection and/or diagnosis of SARS-CoV-2 by  FDA under an Emergency Use Authorization (EUA). This EUA will remain  in effect (meaning this test can be used) for the duration of the  Covid-19 declaration under Section 564(b)(1) of the Act, 21  U.S.C. section 360bbb-3(b)(1), unless the authorization is  terminated or revoked. Performed at [facility], [place] [address], [place], [place] [address]          Radiology Studies: CT ABDOMEN PELVIS WO CONTRAST  Result Date: 11/29/2019 CLINICAL DATA:  Status post random renal biopsy the right kidney on 40/97/3532 complicated by hemorrhage. Decrease in hemoglobin during admission after detection of post biopsy hemorrhage. EXAM: CT ABDOMEN AND PELVIS WITHOUT CONTRAST TECHNIQUE: Multidetector CT imaging of the abdomen and pelvis was performed following the standard protocol without IV contrast. COMPARISON:  11/27/2019 FINDINGS: Lower chest: Slight increase in a small right pleural effusion with associated right basilar atelectasis. Hepatobiliary: No focal liver abnormality is seen. No gallstones, gallbladder wall thickening, or biliary dilatation.  Pancreas: Unremarkable. No pancreatic ductal dilatation or surrounding inflammatory changes. Spleen: Normal in size without focal abnormality. Adrenals/Urinary Tract: Hyperdense, predominantly subcapsular hemorrhage surrounding the right kidney and predominantly located along the lateral aspect of the kidney shows very slight enlargement since the prior study. Maximal subcapsular hematoma diameter is approximately 9 cm transversely and 12.2 cm in oblique height compared to comparable measurements of 8.8 cm and 12 cm on the prior study. Overall hematoma volume is very similar. There is a small component of additional hemorrhage inferior to the right kidney in the retroperitoneum which appears stable since the prior study. No intraperitoneal hemorrhage identified. Mass effect on the right kidney again noted without evidence of  hydronephrosis. The left kidney remains stable and unremarkable in appearance. The bladder is unremarkable. Stomach/Bowel: No evidence of bowel obstruction, ileus or free intraperitoneal air. Vascular/Lymphatic: Stable atherosclerosis of the aorta and iliac arteries. No evidence of aneurysm. No enlarged lymph nodes identified. Reproductive: Status post hysterectomy. No adnexal masses. Other: No abdominal wall hernia or abnormality. No abdominopelvic ascites. Musculoskeletal: No acute or significant osseous findings. IMPRESSION: Essentially stable size of predominately subcapsular hemorrhage surrounding the right kidney with minimal increase in hematoma dimensions of only roughly 2 mm. Hemorrhage remains predominantly confined in a subcapsular location around the right kidney with stable small amount of additional retroperitoneal hemorrhage inferior to the right kidney. Electronically Signed   By: Aletta Edouard M.D.   On: 11/29/2019 12:55   US RENAL  Result Date: 11/29/2019 CLINICAL DATA:  Recent kidney biopsy 11/25/2019. EXAM: RENAL / URINARY TRACT ULTRASOUND COMPLETE COMPARISON:  CT renal  11/27/2019, CT renal 11/29/2019. FINDINGS: Right Kidney: Renal measurements: 10.8 x 4.4 x 4.8 cm = volume: 117 mL. Echogenicity is increased. There is a subcapsular heterogeneous echogenic fluid collection measuring 9.2 x 3.5 x 7.3 cm. No mass or hydronephrosis visualized. Vascularity noted to the renal parenchyma. Left Kidney: Renal measurements: 9.2 x 5.6 x 4.2 cm = volume: 114 mL. Echogenicity is increased. No mass or hydronephrosis visualized. Bladder: Appears normal for degree of bladder distention. Other: None. IMPRESSION: 1. A grossly unchanged 9.2 x 3.5 x 7.3 cm right subcapsular hematoma. 2. Increased renal parenchymal echogenicity consistent with known chronic renal disease. Electronically Signed   By: Iven Finn M.D.   On: 11/29/2019 20:12        Scheduled Meds: . atorvastatin  80 mg Oral Daily  . bromocriptine  1.25 mg Oral Daily  . cloNIDine  0.2 mg Oral TID  . insulin aspart  0-15 Units Subcutaneous TID WC  . insulin aspart  0-5 Units Subcutaneous QHS  . metoprolol tartrate  50 mg Oral BID  . minoxidil  2.5 mg Oral TID  . pantoprazole  40 mg Oral Daily   Continuous Infusions: . sodium chloride 65 mL/hr at 12/01/19 0250          Aline August, MD [facility] 12/01/2019, 8:12 AM

## 2019-12-01 NOTE — Progress Notes (Signed)
Subjective: [name]. Feels overall better than yesterday Pain is much better Hgb relatively stable at 7.5 (8.6 yest) Cr improving at 3.7(3.98 yest)  Objective: Vital signs in last 24 hours: Temp:  [98.5 F (36.9 C)-99.5 F (37.5 C)] 98.5 F (36.9 C) (11/07 0607) Pulse Rate:  [75-87] 75 (11/07 0607) Resp:  [20-21] 20 (11/07 0607) BP: (136-171)/(51-79) 136/54 (11/07 0607) SpO2:  [91 %-95 %] 95 % (11/07 0607)  Intake/Output from previous day: 11/06 0701 - 11/07 0700 In: 696.4 [P.O.:477; I.V.:219.4] Out: 590 [Urine:590] Intake/Output this shift: No intake/output data recorded.  Physical Exam:  General: Alert and oriented, eating breakfast in bed CV: RRR Lungs: Clear Abdomen: Soft, ND GU: right flank tender to palpation Ext: NT, No erythema  Lab Results: Recent Labs    11/29/19 1944 11/30/19 0538 12/01/19 0538  HGB 7.8* 8.6* 7.5*  HCT 22.3* 25.9* 22.2*   BMET Recent Labs    11/30/19 0538 12/01/19 0538  NA 133* 134*  K 4.2 4.2  CL 101 104  CO2 19* 20*  GLUCOSE 153* 178*  BUN 80* 84*  CREATININE 3.98* 3.73*  CALCIUM 8.1* 7.7*     Studies/Results: CT ABDOMEN PELVIS WO CONTRAST  Result Date: 11/29/2019 CLINICAL DATA:  Status post random renal biopsy the right kidney on 63/01/6008 complicated by hemorrhage. Decrease in hemoglobin during admission after detection of post biopsy hemorrhage. EXAM: CT ABDOMEN AND PELVIS WITHOUT CONTRAST TECHNIQUE: Multidetector CT imaging of the abdomen and pelvis was performed following the standard protocol without IV contrast. COMPARISON:  11/27/2019 FINDINGS: Lower chest: Slight increase in a small right pleural effusion with associated right basilar atelectasis. Hepatobiliary: No focal liver abnormality is seen. No gallstones, gallbladder wall thickening, or biliary dilatation. Pancreas: Unremarkable. No pancreatic ductal dilatation or surrounding inflammatory changes. Spleen: Normal in size without focal abnormality.  Adrenals/Urinary Tract: Hyperdense, predominantly subcapsular hemorrhage surrounding the right kidney and predominantly located along the lateral aspect of the kidney shows very slight enlargement since the prior study. Maximal subcapsular hematoma diameter is approximately 9 cm transversely and 12.2 cm in oblique height compared to comparable measurements of 8.8 cm and 12 cm on the prior study. Overall hematoma volume is very similar. There is a small component of additional hemorrhage inferior to the right kidney in the retroperitoneum which appears stable since the prior study. No intraperitoneal hemorrhage identified. Mass effect on the right kidney again noted without evidence of hydronephrosis. The left kidney remains stable and unremarkable in appearance. The bladder is unremarkable. Stomach/Bowel: No evidence of bowel obstruction, ileus or free intraperitoneal air. Vascular/Lymphatic: Stable atherosclerosis of the aorta and iliac arteries. No evidence of aneurysm. No enlarged lymph nodes identified. Reproductive: Status post hysterectomy. No adnexal masses. Other: No abdominal wall hernia or abnormality. No abdominopelvic ascites. Musculoskeletal: No acute or significant osseous findings. IMPRESSION: Essentially stable size of predominately subcapsular hemorrhage surrounding the right kidney with minimal increase in hematoma dimensions of only roughly 2 mm. Hemorrhage remains predominantly confined in a subcapsular location around the right kidney with stable small amount of additional retroperitoneal hemorrhage inferior to the right kidney. Electronically Signed   By: Aletta Edouard M.D.   On: 11/29/2019 12:55   US RENAL  Result Date: 11/29/2019 CLINICAL DATA:  Recent kidney biopsy 11/25/2019. EXAM: RENAL / URINARY TRACT ULTRASOUND COMPLETE COMPARISON:  CT renal 11/27/2019, CT renal 11/29/2019. FINDINGS: Right Kidney: Renal measurements: 10.8 x 4.4 x 4.8 cm = volume: 117 mL. Echogenicity is increased.  There is a subcapsular heterogeneous echogenic fluid collection measuring  9.2 x 3.5 x 7.3 cm. No mass or hydronephrosis visualized. Vascularity noted to the renal parenchyma. Left Kidney: Renal measurements: 9.2 x 5.6 x 4.2 cm = volume: 114 mL. Echogenicity is increased. No mass or hydronephrosis visualized. Bladder: Appears normal for degree of bladder distention. Other: None. IMPRESSION: 1. A grossly unchanged 9.2 x 3.5 x 7.3 cm right subcapsular hematoma. 2. Increased renal parenchymal echogenicity consistent with known chronic renal disease. Electronically Signed   By: Iven Finn M.D.   On: 11/29/2019 20:12    Assessment/Plan: 67 yoF with right renal hematoma s/p US guided bx on 11/1. Responding appropriately to transfusion, stable at this time.  - recommend continuing to monitor H/H until stable - if hemodynamically unstable or continues to require transfusions, would recommend repeat imaging and possible embolization if necessary. - pain control as needed - continue to treat blood pressure as necessary   LOS: 3 days   Murad Staples 12/01/2019, 8:28 AM

## 2019-12-01 NOTE — Progress Notes (Signed)
[facility] Progress Note  Subjective: creat down 3.7 today, UOP up 1200 cc yest.  No N/V, confusion or jerking.  Feeling okay today, R flank pain not a issue now.   Vitals:   11/30/19 1342 11/30/19 1708 11/30/19 2024 12/01/19 0607  BP: (!) 143/65 137/79 (!) 141/51 (!) 136/54  Pulse: 77  76 75  Resp: (!) $RemoveB'21  20 20  'JaJPYdIi$ Temp: 98.9 F (37.2 C)  99.5 F (37.5 C) 98.5 F (36.9 C)  TempSrc: Oral  Oral   SpO2: 91%  94% 95%  Weight:      Height:        Exam: Gen alert, wdwn, no distress Sclera anicteric, throat clear  Chest clear bilat, no rales or wheezing RRR 2/6 sem , noRG Abd soft ntnd no mass or ascites +bs, no CVAT Ext no edema  Neuro is alert, Ox 3 , nf    Date                Creat               eGFR ml/min  2008-2014      0.7- 0.8  2015- 16         1.31- 2.24  2017- 18         1.18- 1.88  2019- 2020     2.03- 2.70        20- 31, CKD IV  aug 2021        2.09  11/3 (admit) 3.08   Home meds:  - norvasc 10/ clonidine 0.2 tid/ metoprolol xl 100 qd/ minoxidil 2.5 tid/ telmisartan-hctz 80-25 qd/ lasix 40 mg qd prn  - repaglinide $RemoveBefor'2mg'XZVeIpEHatcZ$  tid/ bromocriptine 2.5 qd' protonix qd  - asa 325/ lipitor 80  - prn's/ vitamins/ supplements    UA 11/3 - >300 protein, 21-50 rbc, 0-5 wbc, rare bact    CT 11/3 - Adrenals/Urinary Tract: High density large crescentic shaped fluid collection noted around right kidney compatible with perinephric hematoma, 8.9 x 7.4 cm. This compresses the right kidney. Punctate nonobstructing stone in the upper pole of the right kidney. No hydronephrosis. Hematoma also appears to extend around the right adrenal gland. Stranding also continues in the right retroperitoneum inferior to the right kidney. Left adrenal gland is unremarkable. Urinary bladder unremarkable.  Assessment/ Plan: 1. AoCKD 4 - b/l creat 2.0- 2.7, eGFR 20- 31 ml/min. 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 Creat  worsened after IV acei and w/ BP drop to normal,  up to 3.9 on 11/6. UOP better yest and creat down to 3.7 this am, hopefully recovering. Cont to hold IV acei/ home ARB. Euvolemic, good UOP, stable BP's. No new changes, labs in am, will follow.  2. Uncontrolled HTN - resolved w/ reg home meds + IV acei. IV acei dc'd d/t rising creat. Cont home clonidine/ coreg/ minoxidil. Cont to hold home ARB/ hctz. Can add back norvasc at low dose/ bid dosing if needed for BP control.  3. Intrarenal bleed - sp renal biopsy on 11/01, admitted w/ R flank pain and subcapsular renal hematoma. Urology consulted.  CT and US showed stable hematoma.  Hb dropped to 5's and 2u prbc's given on 11/5, Hb 8.6 yest post prbc's and down some to 7.5 this am.  Defer to primary / urology.   4. IDDM - per pmd   Kelly Splinter, MD 12/01/2019, 7:14 AM   Recent Labs  Lab 11/30/19  6060 12/01/19 0538  K 4.2 4.2  BUN 80* 84*  CREATININE 3.98* 3.73*  CALCIUM 8.1* 7.7*  HGB 8.6* 7.5*   Inpatient medications: . atorvastatin  80 mg Oral Daily  . bromocriptine  1.25 mg Oral Daily  . cloNIDine  0.2 mg Oral TID  . insulin aspart  0-15 Units Subcutaneous TID WC  . insulin aspart  0-5 Units Subcutaneous QHS  . metoprolol tartrate  50 mg Oral BID  . minoxidil  2.5 mg Oral TID  . pantoprazole  40 mg Oral Daily   . sodium chloride 65 mL/hr at 12/01/19 0250   acetaminophen **OR** acetaminophen, alum & mag hydroxide-simeth, hydrALAZINE, HYDROcodone-acetaminophen, morphine injection, ondansetron **OR** ondansetron (ZOFRAN) IV, polyethylene glycol

## 2019-12-02 ENCOUNTER — Ambulatory Visit: Payer: Medicare Other | Admitting: Podiatry

## 2019-12-02 DIAGNOSIS — M25562 Pain in left knee: Secondary | ICD-10-CM

## 2019-12-02 DIAGNOSIS — N189 Chronic kidney disease, unspecified: Secondary | ICD-10-CM

## 2019-12-02 DIAGNOSIS — E1165 Type 2 diabetes mellitus with hyperglycemia: Secondary | ICD-10-CM | POA: Diagnosis not present

## 2019-12-02 DIAGNOSIS — S37019A Minor contusion of unspecified kidney, initial encounter: Secondary | ICD-10-CM | POA: Diagnosis not present

## 2019-12-02 DIAGNOSIS — I10 Essential (primary) hypertension: Secondary | ICD-10-CM | POA: Diagnosis not present

## 2019-12-02 DIAGNOSIS — F17200 Nicotine dependence, unspecified, uncomplicated: Secondary | ICD-10-CM | POA: Diagnosis not present

## 2019-12-02 LAB — GLUCOSE, CAPILLARY
Glucose-Capillary: 175 mg/dL — ABNORMAL HIGH (ref 70–99)
Glucose-Capillary: 188 mg/dL — ABNORMAL HIGH (ref 70–99)
Glucose-Capillary: 134 mg/dL — ABNORMAL HIGH (ref 70–99)
Glucose-Capillary: 224 mg/dL — ABNORMAL HIGH (ref 70–99)

## 2019-12-02 LAB — CBC WITH DIFFERENTIAL/PLATELET
Abs Immature Granulocytes: 0.03 10*3/uL (ref 0.00–0.07)
Basophils Absolute: 0 10*3/uL (ref 0.0–0.1)
Basophils Relative: 0 %
Eosinophils Absolute: 0.3 10*3/uL (ref 0.0–0.5)
Eosinophils Relative: 2 %
HCT: 23.6 % — ABNORMAL LOW (ref 36.0–46.0)
Hemoglobin: 7.6 g/dL — ABNORMAL LOW (ref 12.0–15.0)
Immature Granulocytes: 0 %
Lymphocytes Relative: 12 %
Lymphs Abs: 1.3 10*3/uL (ref 0.7–4.0)
MCH: 31.1 pg (ref 26.0–34.0)
MCHC: 32.2 g/dL (ref 30.0–36.0)
MCV: 96.7 fL (ref 80.0–100.0)
Monocytes Absolute: 1.3 10*3/uL — ABNORMAL HIGH (ref 0.1–1.0)
Monocytes Relative: 12 %
Neutro Abs: 7.4 10*3/uL (ref 1.7–7.7)
Neutrophils Relative %: 74 %
Platelets: 179 10*3/uL (ref 150–400)
RBC: 2.44 MIL/uL — ABNORMAL LOW (ref 3.87–5.11)
RDW: 14.9 % (ref 11.5–15.5)
WBC: 10.2 10*3/uL (ref 4.0–10.5)
nRBC: 0 % (ref 0.0–0.2)

## 2019-12-02 LAB — BASIC METABOLIC PANEL
Anion gap: 9 (ref 5–15)
BUN: 88 mg/dL — ABNORMAL HIGH (ref 8–23)
CO2: 20 mmol/L — ABNORMAL LOW (ref 22–32)
Calcium: 7.8 mg/dL — ABNORMAL LOW (ref 8.9–10.3)
Chloride: 106 mmol/L (ref 98–111)
Creatinine, Ser: 3.52 mg/dL — ABNORMAL HIGH (ref 0.44–1.00)
GFR, Estimated: 13 mL/min — ABNORMAL LOW (ref >60)
Glucose, Bld: 154 mg/dL — ABNORMAL HIGH (ref 70–99)
Potassium: 4.2 mmol/L (ref 3.5–5.1)
Sodium: 135 mmol/L (ref 135–145)

## 2019-12-02 LAB — MAGNESIUM: Magnesium: 2.7 mg/dL — ABNORMAL HIGH (ref 1.7–2.4)

## 2019-12-02 NOTE — TOC Progression Note (Signed)
Transition of Care [facility]) - Progression Note    Patient Details  Name: [name] MRN: [id number] Date of Birth: [birth year]  Transition of Care [facility]) CM/SW Contact  Purcell Mouton, RN Phone Number: 12/02/2019, 11:36 AM  Clinical Narrative:    Pt selected [facility] in [place] for SNF. Ship broker started.   Expected Discharge Plan: Home/Self Care Barriers to Discharge: No Barriers Identified  Expected Discharge Plan and Services Expected Discharge Plan: Home/Self Care       Living arrangements for the past 2 months: Single Family Home Expected Discharge Date:  (unknown)                                     Social Determinants of Health (SDOH) Interventions    Readmission Risk Interventions No flowsheet data found.

## 2019-12-02 NOTE — Progress Notes (Signed)
Patient ID: [name], female   DOB: August 16, 1950, 69 y.o.   MRN: [id number]  PROGRESS NOTE    [name]  RSW:[id number] DOB: [birth year] DOA: 11/27/2019 PCP: Marrian Salvage, FNP   Brief Narrative:  69 year old female with history of hypertension, hypothyroidism, diabetes mellitus type 2, diverticulosis and tobacco dependence, recent right kidney biopsy on 11/25/2019 for proteinuria as per nephrology recommendations presented on 11/26/2019 with right flank pain.  On presentation, blood pressure was 220/87; hemoglobin of 9.1, creatinine of 3.08.  CT of the abdomen/pelvis showed a large perinephric hematoma with some compression of kidney.  IR and nephrology were consulted.  Assessment & Plan:   Perinephric hematoma Acute blood loss anemia -Patient had a recent right kidney biopsy on 11/25/2019 and presented with a large right perinephric hematoma with some compression of kidney.  IR and nephrology were consulted and currently being managed conservatively.  Hemoglobin 9.1 on presentation.  Hemoglobin 5.1 on 11/29/2019.  Status post 2 units packed red cell transfusion on 11/29/2019.  Hemoglobin 7.6 this morning. -Urology and IR following.  Repeat CT of the abdomen and pelvis on 11/29/2019 showed stable subcapsular hemorrhage surrounding the right kidney with minimal increase in hematoma dimensions of only roughly 2 mm -DC'd antibiotics on 11/29/2019  Acute kidney injury on chronic kidney disease stage IV -Baseline creatinine between 2-2.7.  Nephrology following.  Creatinine 3.52 today.  Continue monitoring.  IV Lasix has been discontinued by nephrology.  Left knee pain -X-ray shows osteoarthritis.  Will need outpatient follow-up with orthopedics.  Hypertension uncontrolled -Blood pressure improving.  Continue clonidine, metoprolol, minoxidil.    Thrombocytopenia -Improved  Mild hyponatremia -Improved  Hyperlipidemia -continue statin  Diabetes mellitus type 2 with  hyperglycemia -A1c 8.  Continue CBGs with SSI  Generalized conditioning -PT recommends SNF placement.  Social worker consult.   DVT prophylaxis:  SCDs Code Status: Full Family Communication: None at bedside Disposition Plan: Status is: Inpatient  Remains inpatient appropriate because:Inpatient level of care appropriate due to severity of illness   Dispo: The patient is from: Home              Anticipated d/c is to: SNF              Anticipated d/c date is: 1 day              Patient currently is not medically stable to d/c.  Consultants: Nephrology/urology/IR  Procedures: None  Antimicrobials:  Anti-infectives (From admission, onward)   Start     Dose/Rate Route Frequency Ordered Stop   11/28/19 0215  cefTRIAXone (ROCEPHIN) 1 g in sodium chloride 0.9 % 100 mL IVPB  Status:  Discontinued        1 g 200 mL/hr over 30 Minutes Intravenous Every 24 hours 11/28/19 0208 11/29/19 1000       Subjective: Patient seen and examined at bedside.  Denies worsening flank pain.  No overnight worsening shortness of breath, fever or vomiting.  Complains of left knee pain.  Poor historian. Objective: Vitals:   12/01/19 1323 12/01/19 2025 12/02/19 0438 12/02/19 0500  BP: (!) 145/52 (!) 134/56 (!) 150/54   Pulse: 74 73 79   Resp: (!) 24 20 (!) 22   Temp: 99.1 F (37.3 C) 98.5 F (36.9 C) 98.4 F (36.9 C)   TempSrc:  Oral Oral   SpO2: 93% 94% 93%   Weight:    56.4 kg  Height:        Intake/Output Summary (Last 24 hours)  at 12/02/2019 0758 Last data filed at 12/02/2019 0645 Gross per 24 hour  Intake 822 ml  Output 850 ml  Net -28 ml   Filed Weights   11/28/19 1240 11/30/19 0408 12/02/19 0500  Weight: 66.5 kg 56.8 kg 56.4 kg    Examination:  General exam: No distress.  Elderly female lying in bed.  Chronically ill looking.  Poor historian  respiratory system: Bilateral decreased breath sounds at bases with some crackles, intermittently tachypneic  cardiovascular system: Rate  controlled, S1-S2 heard  gastrointestinal system: Abdomen is nondistended, soft and nontender.  Bowel sounds are heard  genitourinary: Mildly tender right flank  extremities: Mild lower extremity edema present.  Left knee slightly swollen and tender.  No clubbing/cyanosis Central nervous system: Awake and alert.  No focal neurological deficits.  Moves extremities.   Skin: No obvious lesions/ecchymosis psychiatry: Affect is flat   Data Reviewed: I have personally reviewed following labs and imaging studies  CBC: Recent Labs  Lab 11/29/19 0425 11/29/19 1944 11/30/19 0538 12/01/19 0538 12/02/19 0433  WBC 8.6 9.9 10.5 10.0 10.2  NEUTROABS 6.2  --  7.9* 7.9* 7.4  HGB 5.1* 7.8* 8.6* 7.5* 7.6*  HCT 15.8* 22.3* 25.9* 22.2* 23.6*  MCV 98.1 97.0 94.5 97.4 96.7  PLT 160 135* 150 147* 161   Basic Metabolic Panel: Recent Labs  Lab 11/28/19 1041 11/29/19 0425 11/30/19 0538 12/01/19 0538 12/02/19 0433  NA 134* 134* 133* 134* 135  K 4.4 4.6 4.2 4.2 4.2  CL 101 100 101 104 106  CO2 22 23 19* 20* 20*  GLUCOSE 243* 179* 153* 178* 154*  BUN 66* 72* 80* 84* 88*  CREATININE 2.76* 3.49* 3.98* 3.73* 3.52*  CALCIUM 8.1* 7.8* 8.1* 7.7* 7.8*  MG  --  2.4  --   --  2.7*   GFR: Estimated Creatinine Clearance: 11.9 mL/min (A) (by C-G formula based on SCr of 3.52 mg/dL (H)). Liver Function Tests: Recent Labs  Lab 11/28/19 0500  AST 27  ALT 29  ALKPHOS 87  BILITOT 0.7  PROT 6.4*  ALBUMIN 2.8*   No results for input(s): LIPASE, AMYLASE in the last 168 hours. No results for input(s): AMMONIA in the last 168 hours. Coagulation Profile: No results for input(s): INR, PROTIME in the last 168 hours. Cardiac Enzymes: No results for input(s): CKTOTAL, CKMB, CKMBINDEX, TROPONINI in the last 168 hours. BNP (last 3 results) No results for input(s): PROBNP in the last 8760 hours. HbA1C: No results for input(s): HGBA1C in the last 72 hours. CBG: Recent Labs  Lab 12/01/19 0756 12/01/19 1134  12/01/19 1632 12/01/19 2150 12/02/19 0748  GLUCAP 154* 206* 203* 265* 175*   Lipid Profile: No results for input(s): CHOL, HDL, LDLCALC, TRIG, CHOLHDL, LDLDIRECT in the last 72 hours. Thyroid Function Tests: No results for input(s): TSH, T4TOTAL, FREET4, T3FREE, THYROIDAB in the last 72 hours. Anemia Panel: No results for input(s): VITAMINB12, FOLATE, FERRITIN, TIBC, IRON, RETICCTPCT in the last 72 hours. Sepsis Labs: No results for input(s): PROCALCITON, LATICACIDVEN in the last 168 hours.  Recent Results (from the past 240 hour(s))  Respiratory Panel by RT PCR (Flu A&B, Covid) - Nasopharyngeal Swab     Status: None   Collection Time: 11/28/19  4:55 AM   Specimen: Nasopharyngeal Swab  Result Value Ref Range Status   SARS Coronavirus 2 by RT PCR NEGATIVE NEGATIVE Final    Comment: (NOTE) SARS-CoV-2 target nucleic acids are NOT DETECTED.  The SARS-CoV-2 RNA is generally detectable in upper respiratoy  specimens during the acute phase of infection. The lowest concentration of SARS-CoV-2 viral copies this assay can detect is 131 copies/mL. A negative result does not preclude SARS-Cov-2 infection and should not be used as the sole basis for treatment or other patient management decisions. A negative result may occur with  improper specimen collection/handling, submission of specimen other than nasopharyngeal swab, presence of viral mutation(s) within the areas targeted by this assay, and inadequate number of viral copies (<131 copies/mL). A negative result must be combined with clinical observations, patient history, and epidemiological information. The expected result is Negative.  Fact Sheet for Patients:  PinkCheek.be  Fact Sheet for Healthcare Providers:  GravelBags.it  This test is no t yet approved or cleared by the [place] FDA and  has been authorized for detection and/or diagnosis of SARS-CoV-2 by FDA under  an Emergency Use Authorization (EUA). This EUA will remain  in effect (meaning this test can be used) for the duration of the COVID-19 declaration under Section 564(b)(1) of the Act, 21 U.S.C. section 360bbb-3(b)(1), unless the authorization is terminated or revoked sooner.     Influenza A by PCR NEGATIVE NEGATIVE Final   Influenza B by PCR NEGATIVE NEGATIVE Final    Comment: (NOTE) The Xpert Xpress SARS-CoV-2/FLU/RSV assay is intended as an aid in  the diagnosis of influenza from Nasopharyngeal swab specimens and  should not be used as a sole basis for treatment. Nasal washings and  aspirates are unacceptable for Xpert Xpress SARS-CoV-2/FLU/RSV  testing.  Fact Sheet for Patients: PinkCheek.be  Fact Sheet for Healthcare Providers: GravelBags.it  This test is not yet approved or cleared by the [place] FDA and  has been authorized for detection and/or diagnosis of SARS-CoV-2 by  FDA under an Emergency Use Authorization (EUA). This EUA will remain  in effect (meaning this test can be used) for the duration of the  Covid-19 declaration under Section 564(b)(1) of the Act, 21  U.S.C. section 360bbb-3(b)(1), unless the authorization is  terminated or revoked. Performed at [facility], [place] [address] [place], [place] [address]          Radiology Studies: DG Knee 1-2 Views Left  Result Date: 12/01/2019 CLINICAL DATA:  Knee pain EXAM: LEFT KNEE - 1-2 VIEW COMPARISON:  None. FINDINGS: There is tricompartmental osteoarthritis, advanced within the medial compartment joint space loss and marginal osteophyte formation. A superior patellar pole is seen. A small knee joint effusion is noted. Prepatellar subcutaneous edema seen. There is scattered dense vascular calcifications noted. IMPRESSION: Tricompartmental osteoarthritis, advanced within the medial compartment. Electronically Signed   By: Prudencio Pair  M.D.   On: 12/01/2019 16:13        Scheduled Meds: . atorvastatin  80 mg Oral Daily  . bromocriptine  1.25 mg Oral Daily  . cloNIDine  0.2 mg Oral TID  . insulin aspart  0-15 Units Subcutaneous TID WC  . insulin aspart  0-5 Units Subcutaneous QHS  . metoprolol tartrate  50 mg Oral BID  . minoxidil  2.5 mg Oral TID  . pantoprazole  40 mg Oral Daily   Continuous Infusions:         Aline August, MD [facility] 12/02/2019, 7:58 AM

## 2019-12-02 NOTE — Progress Notes (Signed)
Occupational Therapy Evaluation Patient Details Name: [name] MRN: [id number] DOB: 03/16/1950 Today's Date: 12/02/2019    History of Present Illness 69 year old female with history of hypertension, hypothyroidism, diabetes mellitus type 2, diverticulosis and tobacco dependence, recent right kidney biopsy on 11/25/2019 for proteinuria as per nephrology recommendations presented on 11/26/2019 with right flank pain.  On presentation, blood pressure was 220/87; hemoglobin of 9.1, creatinine of 3.08.  CT of the abdomen/pelvis showed a large perinephric hematoma with some compression of kidney   Clinical Impression   PTA, pt lived alone and was independent. Pt with significant decline in functional status doe to deficits listed below. Pt primarily limited at this time by L knee pain.  Pt will benefit from rehab at SNF to maximize functional level of independence to facilitate safe DC home. Will follow acutely.    Follow Up Recommendations  SNF;Supervision/Assistance - 24 hour    Equipment Recommendations  3 in 1 bedside commode    Recommendations for Other Services       Precautions / Restrictions Precautions Precautions: Fall Precaution Comments: L knee pain Restrictions Weight Bearing Restrictions: No      Mobility Bed Mobility Overal bed mobility: Needs Assistance       Supine to sit: Mod assist;HOB elevated          Transfers Overall transfer level: Needs assistance Equipment used: Rolling walker (2 wheeled) Transfers: Sit to/from [facility] Sit to Stand: Mod assist Stand pivot transfers: Mod assist;+2 safety/equipment       General transfer comment: VC for sequencing/use of RW and upright posture; head down towards chest    Balance Overall balance assessment: History of Falls;Needs assistance   Sitting balance-Leahy Scale: Fair       Standing balance-Leahy Scale: Poor                             ADL either performed or  assessed with clinical judgement   ADL Overall ADL's : Needs assistance/impaired     Grooming: Set up;Sitting   Upper Body Bathing: Set up;Sitting   Lower Body Bathing: Moderate assistance;Sit to/from stand   Upper Body Dressing : Set up;Sitting   Lower Body Dressing: Moderate assistance;Sit to/from stand   Toilet Transfer: Moderate assistance;+2 for safety/equipment;Stand-pivot;RW   Toileting- Clothing Manipulation and Hygiene: Moderate assistance;Sit to/from stand       Functional mobility during ADLs: Moderate assistance;+2 for safety/equipment;Rolling walker;Cueing for safety;Cueing for sequencing       Vision         Perception     Praxis      Pertinent Vitals/Pain Pain Assessment: 0-10 Pain Score: 9  Pain Location: L knee Pain Descriptors / Indicators: Aching;Discomfort;Grimacing;Guarding Pain Intervention(s): Limited activity within patient's tolerance;Repositioned;Ice applied     Hand Dominance Right   Extremity/Trunk Assessment Upper Extremity Assessment Upper Extremity Assessment: Generalized weakness   Lower Extremity Assessment Lower Extremity Assessment: Defer to PT evaluation LLE Deficits / Details: moderate edema; unable to complete terminal extension; positioning in flexion @ 30 degrees LLE: Unable to fully assess due to pain   Cervical / Trunk Assessment Cervical / Trunk Assessment: Kyphotic;Other exceptions (forward fhead)   Communication Communication Communication: No difficulties   Cognition Arousal/Alertness: Awake/alert Behavior During Therapy: Flat affect Overall Cognitive Status: No family/caregiver present to determine baseline cognitive functioning Area of Impairment: Safety/judgement;Awareness;Problem solving;Attention  Current Attention Level: Selective   Following Commands: Follows one step commands with increased time Safety/Judgement: Decreased awareness of safety;Decreased awareness of  deficits Awareness: Emergent Problem Solving: Slow processing     General Comments  fell PTA; states "my leftknee just gave out on me"    Exercises Exercises: Other exercises Other Exercises Other Exercises: encouraged positioning in termainsl extension   Shoulder Instructions      Home Living Family/patient expects to be discharged to:: Private residence Living Arrangements: Alone Available Help at Discharge: Family;Available PRN/intermittently Type of Home: House Home Access: Level entry     Home Layout: One level     Bathroom Shower/Tub: Teacher, early years/pre: Standard Bathroom Accessibility: Yes How Accessible: Accessible via walker Home Equipment: Cane - single point          Prior Functioning/Environment Level of Independence: Independent with assistive device(s)                 OT Problem List: Decreased strength;Decreased activity tolerance;Impaired balance (sitting and/or standing);Decreased cognition;Decreased safety awareness;Decreased knowledge of use of DME or AE;Pain;Increased edema      OT Treatment/Interventions: Self-care/ADL training;Therapeutic exercise;Energy conservation;DME and/or AE instruction;Therapeutic activities;Cognitive remediation/compensation;Patient/family education;Balance training    OT Goals(Current goals can be found in the care plan section) Acute Rehab OT Goals Patient Stated Goal: to get better OT Goal Formulation: With patient Time For Goal Achievement: 12/16/19 Potential to Achieve Goals: Good  OT Frequency: Min 2X/week   Barriers to D/C:            Co-evaluation              AM-PAC OT "6 Clicks" Daily Activity     Outcome Measure Help from another person eating meals?: None Help from another person taking care of personal grooming?: A Little Help from another person toileting, which includes using toliet, bedpan, or urinal?: A Lot Help from another person bathing (including washing,  rinsing, drying)?: A Lot Help from another person to put on and taking off regular upper body clothing?: A Little Help from another person to put on and taking off regular lower body clothing?: A Lot 6 Click Score: 16   End of Session Equipment Utilized During Treatment: Gait belt;Rolling walker Nurse Communication: Mobility status  Activity Tolerance: Patient tolerated treatment well Patient left: in chair;with call bell/phone within reach;with chair alarm set  OT Visit Diagnosis: Unsteadiness on feet (R26.81);Muscle weakness (generalized) (M62.81);History of falling (Z91.81);Pain;Other symptoms and signs involving cognitive function Pain - Right/Left: Left Pain - part of body: Knee                Time: 9935-7017 OT Time Calculation (min): 18 min Charges:  OT General Charges $OT Visit: 1 Visit OT Evaluation $OT Eval Moderate Complexity: [place], OT/L   Acute OT Clinical Specialist [place] Pager [phone] Office [phone]   [facility] 12/02/2019, 10:51 AM

## 2019-12-02 NOTE — Progress Notes (Signed)
Patient ID: [name], female   DOB: 06-22-1950, 69 y.o.   MRN: [id number] Pt sl drowsy but arousable; c/o some left knee pain; denies hematuria or worsening flank pain VSS; afebrile WBC nl; hgb 7.6(7.5)- no additional transfusions per nurse; creat 3.52(3.73)- nephrology monitoring   A/P: random rt renal bx 11/25/19 compl by rt perinephric hematoma; cont with current lab checks, conservative management; last CT 11/5 with stable size of hematoma

## 2019-12-02 NOTE — NC FL2 (Signed)
[place] LEVEL OF CARE SCREENING TOOL     IDENTIFICATION  Patient Name: [name] Birthdate: 30-Sep-1950 Sex: female Admission Date (Current Location): 11/27/2019  [facility] and [place] Number:  Herbalist and Address:  [facility],  [place] [address] [place]      Provider Number: [id number]  Attending Physician Name and Address:  Aline August, MD  Relative Name and Phone Number:  Elizbeth Squires sister [phone]    Current Level of Care:   Recommended Level of Care: [place] Prior Approval Number:    Date Approved/Denied:   PASRR Number: [id number]  Discharge Plan: SNF    Current Diagnoses: Patient Active Problem List   Diagnosis Date Noted  . Right flank discomfort 11/28/2019  . Vitamin D toxicity, accidental or unintentional, initial encounter 09/09/2017  . Ataxia 09/09/2017  . Type 2 diabetes mellitus with hyperglycemia ([place]) 09/09/2017  . Chronic kidney disease (CKD)   . Olecranon bursitis of right elbow 03/09/2017  . Thyroid nodule 06/29/2016  . Type 2 diabetes mellitus ([place]) 06/13/2016  . Vitamin D deficiency 01/12/2016  . Medicare annual wellness visit, initial 01/11/2016  . Lacunar infarct, acute ([place]) 03/23/2015  . Non compliance w medication regimen 01/16/2015  . Anemia 01/08/2015  . Stroke ([place]) 01/08/2015  . Hyperlipemia   . Carotid bruit present 12/13/2012  . Routine health maintenance 10/24/2010  . PERIPHERAL EDEMA 09/10/2009  . ELECTROCARDIOGRAM, ABNORMAL 04/11/2008  . HEART MURMUR, SYSTOLIC 70/17/7939  . SEBACEOUS CYST, NECK 12/13/2006  . Hyperthyroidism 09/27/2006  . Secondary diabetes with peripheral vascular disease ([place]) 09/27/2006  . Hyperlipidemia LDL goal <70 09/27/2006  . TOBACCO ABUSE 09/27/2006  . Essential hypertension 09/27/2006    Orientation RESPIRATION BLADDER Height & Weight     Self, Time, Situation, Place  Normal Continent Weight: 56.4 kg Height:  5'  2" (157.5 cm)  BEHAVIORAL SYMPTOMS/MOOD NEUROLOGICAL BOWEL NUTRITION STATUS      Continent Diet (Carb Modified)  AMBULATORY STATUS COMMUNICATION OF NEEDS Skin   Extensive Assist   Normal                       Personal Care Assistance Level of Assistance  Bathing, Feeding, Dressing Bathing Assistance: Limited assistance Feeding assistance: Independent Dressing Assistance: Limited assistance     Functional Limitations Info  Sight, Hearing, Speech Sight Info: Impaired (glasses) Hearing Info: Adequate Speech Info: Adequate    SPECIAL CARE FACTORS FREQUENCY  PT (By licensed PT), OT (By licensed OT)     PT Frequency: 5x week OT Frequency: 5x week            Contractures Contractures Info: Not present    Additional Factors Info  Code Status, Allergies Code Status Info: FULL Allergies Info: No Known Allergies           Current Medications (12/02/2019):  This is the current hospital active medication list Current Facility-Administered Medications  Medication Dose Route Frequency Provider Last Rate Last Admin  . acetaminophen (TYLENOL) tablet 650 mg  650 mg Oral Q6H PRN Bunnie Pion Z, DO       Or  . acetaminophen (TYLENOL) suppository 650 mg  650 mg Rectal Q6H PRN Bunnie Pion Z, DO      . alum & mag hydroxide-simeth (MAALOX/MYLANTA) 200-200-20 MG/5ML suspension 15 mL  15 mL Oral Q4H PRN Aline August, MD   15 mL at 11/28/19 1136  . atorvastatin (LIPITOR) tablet 80 mg  80 mg Oral  Daily Bunnie Pion Z, DO   80 mg at 12/02/19 1007  . bromocriptine (PARLODEL) tablet 1.25 mg  1.25 mg Oral Daily Bunnie Pion Z, DO   1.25 mg at 12/02/19 1006  . cloNIDine (CATAPRES) tablet 0.2 mg  0.2 mg Oral TID Roney Jaffe, MD   0.2 mg at 12/02/19 1006  . hydrALAZINE (APRESOLINE) injection 10 mg  10 mg Intravenous Q6H PRN Bunnie Pion Z, DO      . HYDROcodone-acetaminophen (NORCO/VICODIN) 5-325 MG per tablet 1-2 tablet  1-2 tablet Oral Q4H PRN Bunnie Pion Z, DO   1 tablet  at 12/01/19 1703  . insulin aspart (novoLOG) injection 0-15 Units  0-15 Units Subcutaneous TID WC Bunnie Pion Z, DO   3 Units at 12/02/19 1008  . insulin aspart (novoLOG) injection 0-5 Units  0-5 Units Subcutaneous QHS Bunnie Pion Z, DO   3 Units at 12/01/19 2225  . metoprolol tartrate (LOPRESSOR) tablet 50 mg  50 mg Oral BID Roney Jaffe, MD   50 mg at 12/02/19 1006  . minoxidil (LONITEN) tablet 2.5 mg  2.5 mg Oral TID Roney Jaffe, MD   2.5 mg at 12/02/19 1007  . morphine 2 MG/ML injection 2 mg  2 mg Intravenous Q4H PRN Bunnie Pion Z, DO   2 mg at 11/28/19 1032  . ondansetron (ZOFRAN) tablet 4 mg  4 mg Oral Q6H PRN Bunnie Pion Z, DO       Or  . ondansetron [facility]) injection 4 mg  4 mg Intravenous Q6H PRN Bunnie Pion Z, DO      . pantoprazole (PROTONIX) EC tablet 40 mg  40 mg Oral Daily Bunnie Pion Z, DO   40 mg at 12/02/19 1007  . polyethylene glycol (MIRALAX / GLYCOLAX) packet 34 g  34 g Oral Daily PRN Minda Ditto, RPH   34 g at 12/01/19 2236     Discharge Medications: Please see discharge summary for a list of discharge medications.  Relevant Imaging Results:  Relevant Lab Results:   Additional Information [phone]  Purcell Mouton, RN

## 2019-12-02 NOTE — Care Management Important Message (Signed)
Important Message  Patient Details IM Letter given to the Patient Name: [name] MRN: [id number] Date of Birth: 1950-12-01   Medicare Important Message Given:  Yes     Kerin Salen 12/02/2019, 11:39 AM

## 2019-12-02 NOTE — Progress Notes (Signed)
[facility] Progress Note  Subjective: creat down 3.5 today, UOP 821mL yest (was 1249mL day prior).  No N/V, confusion or hiccups.  Feeling okay today, R flank pain not a issue now. Denies hematuria.   Says she lives independently, drives - she sat in chair but hasn't been up and around.   Vitals:   12/01/19 1323 12/01/19 2025 12/02/19 0438 12/02/19 0500  BP: (!) 145/52 (!) 134/56 (!) 150/54   Pulse: 74 73 79   Resp: (!) 24 20 (!) 22   Temp: 99.1 F (37.3 C) 98.5 F (36.9 C) 98.4 F (36.9 C)   TempSrc:  Oral Oral   SpO2: 93% 94% 93%   Weight:    56.4 kg  Height:        Exam: Gen alert, wdwn, no distress  Sclera anicteric, throat clear  Chest clear bilat, no rales or wheezing RRR 2/6 sem , noRG Abd soft ntnd no mass or ascites +bs, no CVAT Ext no edema  Neuro is alert, Ox 3 , nf    Date                Creat               eGFR ml/min  2008-2014      0.7- 0.8  2015- 16         1.31- 2.24  2017- 18         1.18- 1.88  2019- 2020     2.03- 2.70        20- 31, CKD IV  aug 2021        2.09  11/3 (admit) 3.08   Home meds:  - norvasc 10/ clonidine 0.2 tid/ metoprolol xl 100 qd/ minoxidil 2.5 tid/ telmisartan-hctz 80-25 qd/ lasix 40 mg qd prn  - repaglinide $RemoveBefor'2mg'iQGeFKmOCheL$  tid/ bromocriptine 2.5 qd' protonix qd  - asa 325/ lipitor 80  - prn's/ vitamins/ supplements    UA 11/3 - >300 protein, 21-50 rbc, 0-5 wbc, rare bact    CT 11/3 - Adrenals/Urinary Tract: High density large crescentic shaped fluid collection noted around right kidney compatible with perinephric hematoma, 8.9 x 7.4 cm. This compresses the right kidney. Punctate nonobstructing stone in the upper pole of the right kidney. No hydronephrosis. Hematoma also appears to extend around the right adrenal gland. Stranding also continues in the right retroperitoneum inferior to the right kidney. Left adrenal gland is unremarkable. Urinary bladder unremarkable.  Assessment/ Plan: 1. AoCKD 4 - b/l creat 2.0- 2.7,  eGFR 20- 31 ml/min. 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 Creat worsened after IV acei and w/ BP drop to normal,  up to 3.9 on 11/6. UOP better yest and creat down to 3.5 this am, hopefully recovering. Cont to hold acei/ home ARB. Euvolemic, good UOP, stable BP's. No new changes, labs in am, will follow.  2. Uncontrolled HTN - resolved w/ reg home meds + IV acei. IV acei dc'd d/t rising creat. Cont home clonidine/ coreg/ minoxidil. Cont to hold home ARB/ hctz. Can add back norvasc at low dose/ bid dosing if needed for BP control.   3. Intrarenal bleed - sp renal biopsy on 11/01, admitted w/ R flank pain and subcapsular renal hematoma. Urology consulted.  CT and US showed stable hematoma.  Hb dropped to 5's and 2u prbc's given on 11/5, Hb 8.6 yest post prbc's and down some to 7.6 this am.  Defer to  primary / urology.   4. IDDM - per pmd   Maria Hick MD [place] Pager [phone]   Recent Labs  Lab 12/01/19 0538 12/02/19 0433  K 4.2 4.2  BUN 84* 88*  CREATININE 3.73* 3.52*  CALCIUM 7.7* 7.8*  HGB 7.5* 7.6*   Inpatient medications: . atorvastatin  80 mg Oral Daily  . bromocriptine  1.25 mg Oral Daily  . cloNIDine  0.2 mg Oral TID  . insulin aspart  0-15 Units Subcutaneous TID WC  . insulin aspart  0-5 Units Subcutaneous QHS  . metoprolol tartrate  50 mg Oral BID  . minoxidil  2.5 mg Oral TID  . pantoprazole  40 mg Oral Daily    acetaminophen **OR** acetaminophen, alum & mag hydroxide-simeth, hydrALAZINE, HYDROcodone-acetaminophen, morphine injection, ondansetron **OR** ondansetron (ZOFRAN) IV, polyethylene glycol

## 2019-12-03 DIAGNOSIS — R52 Pain, unspecified: Secondary | ICD-10-CM | POA: Diagnosis not present

## 2019-12-03 DIAGNOSIS — I10 Essential (primary) hypertension: Secondary | ICD-10-CM | POA: Diagnosis not present

## 2019-12-03 DIAGNOSIS — I700 Atherosclerosis of aorta: Secondary | ICD-10-CM | POA: Diagnosis not present

## 2019-12-03 DIAGNOSIS — E871 Hypo-osmolality and hyponatremia: Secondary | ICD-10-CM | POA: Diagnosis not present

## 2019-12-03 DIAGNOSIS — F17210 Nicotine dependence, cigarettes, uncomplicated: Secondary | ICD-10-CM | POA: Diagnosis not present

## 2019-12-03 DIAGNOSIS — D849 Immunodeficiency, unspecified: Secondary | ICD-10-CM | POA: Diagnosis not present

## 2019-12-03 DIAGNOSIS — M4805 Spinal stenosis, thoracolumbar region: Secondary | ICD-10-CM | POA: Diagnosis not present

## 2019-12-03 DIAGNOSIS — S37019S Minor contusion of unspecified kidney, sequela: Secondary | ICD-10-CM | POA: Diagnosis not present

## 2019-12-03 DIAGNOSIS — N184 Chronic kidney disease, stage 4 (severe): Secondary | ICD-10-CM | POA: Diagnosis not present

## 2019-12-03 DIAGNOSIS — R2681 Unsteadiness on feet: Secondary | ICD-10-CM | POA: Diagnosis not present

## 2019-12-03 DIAGNOSIS — K219 Gastro-esophageal reflux disease without esophagitis: Secondary | ICD-10-CM | POA: Diagnosis not present

## 2019-12-03 DIAGNOSIS — D62 Acute posthemorrhagic anemia: Secondary | ICD-10-CM | POA: Diagnosis not present

## 2019-12-03 DIAGNOSIS — R262 Difficulty in walking, not elsewhere classified: Secondary | ICD-10-CM | POA: Diagnosis not present

## 2019-12-03 DIAGNOSIS — M25562 Pain in left knee: Secondary | ICD-10-CM | POA: Diagnosis not present

## 2019-12-03 DIAGNOSIS — D649 Anemia, unspecified: Secondary | ICD-10-CM | POA: Diagnosis not present

## 2019-12-03 DIAGNOSIS — D696 Thrombocytopenia, unspecified: Secondary | ICD-10-CM | POA: Diagnosis not present

## 2019-12-03 DIAGNOSIS — M1712 Unilateral primary osteoarthritis, left knee: Secondary | ICD-10-CM | POA: Diagnosis not present

## 2019-12-03 DIAGNOSIS — E039 Hypothyroidism, unspecified: Secondary | ICD-10-CM | POA: Diagnosis not present

## 2019-12-03 DIAGNOSIS — Z743 Need for continuous supervision: Secondary | ICD-10-CM | POA: Diagnosis not present

## 2019-12-03 DIAGNOSIS — R279 Unspecified lack of coordination: Secondary | ICD-10-CM | POA: Diagnosis not present

## 2019-12-03 DIAGNOSIS — E785 Hyperlipidemia, unspecified: Secondary | ICD-10-CM | POA: Diagnosis not present

## 2019-12-03 DIAGNOSIS — F17200 Nicotine dependence, unspecified, uncomplicated: Secondary | ICD-10-CM | POA: Diagnosis not present

## 2019-12-03 DIAGNOSIS — R531 Weakness: Secondary | ICD-10-CM | POA: Diagnosis not present

## 2019-12-03 DIAGNOSIS — K5790 Diverticulosis of intestine, part unspecified, without perforation or abscess without bleeding: Secondary | ICD-10-CM | POA: Diagnosis not present

## 2019-12-03 DIAGNOSIS — M6281 Muscle weakness (generalized): Secondary | ICD-10-CM | POA: Diagnosis not present

## 2019-12-03 DIAGNOSIS — R109 Unspecified abdominal pain: Secondary | ICD-10-CM | POA: Diagnosis not present

## 2019-12-03 DIAGNOSIS — E1165 Type 2 diabetes mellitus with hyperglycemia: Secondary | ICD-10-CM | POA: Diagnosis not present

## 2019-12-03 DIAGNOSIS — R809 Proteinuria, unspecified: Secondary | ICD-10-CM | POA: Diagnosis not present

## 2019-12-03 DIAGNOSIS — M79621 Pain in right upper arm: Secondary | ICD-10-CM | POA: Diagnosis not present

## 2019-12-03 DIAGNOSIS — S37019A Minor contusion of unspecified kidney, initial encounter: Secondary | ICD-10-CM | POA: Diagnosis not present

## 2019-12-03 DIAGNOSIS — M25521 Pain in right elbow: Secondary | ICD-10-CM | POA: Diagnosis not present

## 2019-12-03 DIAGNOSIS — M549 Dorsalgia, unspecified: Secondary | ICD-10-CM | POA: Diagnosis not present

## 2019-12-03 DIAGNOSIS — N179 Acute kidney failure, unspecified: Secondary | ICD-10-CM | POA: Diagnosis not present

## 2019-12-03 DIAGNOSIS — E1122 Type 2 diabetes mellitus with diabetic chronic kidney disease: Secondary | ICD-10-CM | POA: Diagnosis not present

## 2019-12-03 LAB — CBC WITH DIFFERENTIAL/PLATELET
Abs Immature Granulocytes: 0.04 10*3/uL (ref 0.00–0.07)
Basophils Absolute: 0 10*3/uL (ref 0.0–0.1)
Basophils Relative: 0 %
Eosinophils Absolute: 0.3 10*3/uL (ref 0.0–0.5)
Eosinophils Relative: 3 %
HCT: 24.2 % — ABNORMAL LOW (ref 36.0–46.0)
Hemoglobin: 7.8 g/dL — ABNORMAL LOW (ref 12.0–15.0)
Immature Granulocytes: 0 %
Lymphocytes Relative: 11 %
Lymphs Abs: 1 10*3/uL (ref 0.7–4.0)
MCH: 31.5 pg (ref 26.0–34.0)
MCHC: 32.2 g/dL (ref 30.0–36.0)
MCV: 97.6 fL (ref 80.0–100.0)
Monocytes Absolute: 1 10*3/uL (ref 0.1–1.0)
Monocytes Relative: 11 %
Neutro Abs: 6.6 10*3/uL (ref 1.7–7.7)
Neutrophils Relative %: 75 %
Platelets: 209 10*3/uL (ref 150–400)
RBC: 2.48 MIL/uL — ABNORMAL LOW (ref 3.87–5.11)
RDW: 14.8 % (ref 11.5–15.5)
WBC: 8.9 10*3/uL (ref 4.0–10.5)
nRBC: 0 % (ref 0.0–0.2)

## 2019-12-03 LAB — BASIC METABOLIC PANEL
Anion gap: 8 (ref 5–15)
BUN: 86 mg/dL — ABNORMAL HIGH (ref 8–23)
CO2: 21 mmol/L — ABNORMAL LOW (ref 22–32)
Calcium: 8.2 mg/dL — ABNORMAL LOW (ref 8.9–10.3)
Chloride: 106 mmol/L (ref 98–111)
Creatinine, Ser: 3.39 mg/dL — ABNORMAL HIGH (ref 0.44–1.00)
GFR, Estimated: 14 mL/min — ABNORMAL LOW (ref >60)
Glucose, Bld: 217 mg/dL — ABNORMAL HIGH (ref 70–99)
Potassium: 4.3 mmol/L (ref 3.5–5.1)
Sodium: 135 mmol/L (ref 135–145)

## 2019-12-03 LAB — GLUCOSE, CAPILLARY
Glucose-Capillary: 208 mg/dL — ABNORMAL HIGH (ref 70–99)
Glucose-Capillary: 197 mg/dL — ABNORMAL HIGH (ref 70–99)

## 2019-12-03 MED ORDER — HYDROCODONE-ACETAMINOPHEN 5-325 MG PO TABS
1.0000 | ORAL_TABLET | Freq: Four times a day (QID) | ORAL | 0 refills | Status: DC | PRN
Start: 2019-12-03 — End: 2020-01-06

## 2019-12-03 MED ORDER — METOPROLOL TARTRATE 50 MG PO TABS
50.0000 mg | ORAL_TABLET | Freq: Two times a day (BID) | ORAL | 0 refills | Status: DC
Start: 2019-12-03 — End: 2020-01-06

## 2019-12-03 NOTE — Progress Notes (Signed)
Pt to be discharged to [place] today, report called to this facility and Tanna Savoy RN accepting report. VSS and no acute changes noted in Pt's assessment

## 2019-12-03 NOTE — Discharge Summary (Addendum)
Physician Discharge Summary  [name] BTD:[id number] DOB: 18-May-1950 DOA: 11/27/2019  PCP: Marrian Salvage, FNP  Admit date: 11/27/2019 Discharge date: 12/03/2019  Admitted From: Home Disposition: SNF  Recommendations for Outpatient Follow-up:  1. Follow up with SNF provider at earliest convenience 2. Outpatient follow-up with nephrology. Nephrology recommends that patient have CBC/BMP/phosphorus done at SNF on 12/09/2019 and the results to be faxed to [phone] 3. Outpatient follow-up with IR 4. Consider outpatient evaluation and follow-up by orthopedics 5. Follow up in ED if symptoms worsen or new appear   Home Health: No Equipment/Devices: None  Discharge Condition: Stable CODE STATUS: Full Diet recommendation: Heart healthy/carb modified  Brief/Interim Summary: 69 year old female with history of hypertension, hypothyroidism, diabetes mellitus type 2, diverticulosis and tobacco dependence, recent right kidney biopsy on 11/25/2019 for proteinuria as per nephrology recommendations presented on 11/26/2019 with right flank pain.  On presentation, blood pressure was 220/87; hemoglobin of 9.1, creatinine of 3.08.  CT of the abdomen/pelvis showed a large perinephric hematoma with some compression of kidney.  IR and nephrology were consulted. Patient was managed conservatively. Hemoglobin dropped to 5.1 on 11/29/2019. She was transfused 2 units packed red cells. Subsequently hemoglobin has remained stable. Kidney function also is gradually improving. PT recommended SNF placement. IR and nephrology have cleared the patient for discharge with outpatient follow-up. Discharge to SNF once bed is available.  Discharge Diagnoses:   Perinephric hematoma Acute blood loss anemia -Patient had a recent right kidney biopsy on 11/25/2019 and presented with a large right perinephric hematoma with some compression of kidney.  IR and nephrology were consulted and currently being managed  conservatively.  Hemoglobin 9.1 on presentation.  Hemoglobin 5.1 on 11/29/2019.  Status post 2 units packed red cell transfusion on 11/29/2019.  Hemoglobin 7.8 this morning. -Urology and IR following.  Repeat CT of the abdomen and pelvis on 11/29/2019 showed stable subcapsular hemorrhage surrounding the right kidney with minimal increase in hematoma dimensions of only roughly 2 mm -DC'd antibiotics on 11/29/2019 -Patient has remained stable. Nephrology and IR have cleared the patient for discharge with outpatient follow-up. Nephrology recommends follow-up labs as mentioned above. -Discharge patient to SNF once bed is available.  Acute kidney injury on chronic kidney disease stage IV -Baseline creatinine between 2-2.7.  Nephrology following.  Creatinine 3.39 today.  Continue monitoring.  IV Lasix has been discontinued by nephrology. Lasix, telmisartan-hydrochlorthiazide will remain on hold. Outpatient follow-up with nephrology.  Left knee pain -X-ray shows osteoarthritis.  Will need outpatient follow-up with orthopedics.  Hypertension uncontrolled -Continue clonidine, metoprolol, minoxidil.   Blood pressure improving but still on the higher side. Amlodipine can be added if blood pressure remains an issue. Outpatient follow-up  Thrombocytopenia -Improved  Mild hyponatremia -Improved  Hyperlipidemia -continue statin  Diabetes mellitus type 2 with hyperglycemia -A1c 8.   Carb modified diet. Continue home regimen.  Generalized conditioning -PT recommends SNF placement.    .Discharge Instructions  Discharge Instructions    Ambulatory referral to Interventional Radiology   Complete by: As directed    Perinephric hematoma follow-up   Diet - low sodium heart healthy   Complete by: As directed    Diet Carb Modified   Complete by: As directed    Increase activity slowly   Complete by: As directed      Allergies as of 12/03/2019   No Known Allergies     Medication List    STOP  taking these medications   amLODipine 10 MG tablet Commonly known as: NORVASC  aspirin 325 MG tablet   furosemide 40 MG tablet Commonly known as: LASIX   metoprolol succinate 100 MG 24 hr tablet Commonly known as: TOPROL-XL   telmisartan-hydrochlorothiazide 80-25 MG tablet Commonly known as: MICARDIS HCT   vitamin B-12 100 MCG tablet Commonly known as: CYANOCOBALAMIN     TAKE these medications   atorvastatin 80 MG tablet Commonly known as: LIPITOR TAKE 1 TABLET BY MOUTH EVERY DAY What changed:   how much to take  how to take this  when to take this  additional instructions   bromocriptine 2.5 MG tablet Commonly known as: PARLODEL TAKE 1/2 TABLET BY MOUTH EVERY DAY What changed: how much to take   calcitRIOL 0.25 MCG capsule Commonly known as: ROCALTROL Take 0.25 mcg by mouth daily.   cloNIDine 0.2 MG tablet Commonly known as: CATAPRES Take 1 tablet (0.2 mg total) by mouth 3 (three) times daily.   HYDROcodone-acetaminophen 5-325 MG tablet Commonly known as: NORCO/VICODIN Take 1-2 tablets by mouth every 6 (six) hours as needed for moderate pain.   metoprolol tartrate 50 MG tablet Commonly known as: LOPRESSOR Take 1 tablet (50 mg total) by mouth 2 (two) times daily.   minoxidil 2.5 MG tablet Commonly known as: LONITEN Take 2.5 mg by mouth 3 (three) times daily.   OneTouch Delica Lancets 02R Misc 1 each by Does not apply route 2 (two) times daily. E11.9   OneTouch Verio Flex System w/Device Kit 1 each by Does not apply route 2 (two) times daily. E11.9   OneTouch Verio test strip Generic drug: glucose blood USE AS INSTRUCTED (TESTS 2 TIMES A DAY)   pantoprazole 40 MG tablet Commonly known as: PROTONIX Take 1 tablet (40 mg total) by mouth daily.   polyethylene glycol 17 g packet Commonly known as: MIRALAX / GLYCOLAX Take 34-51 g by mouth daily as needed for moderate constipation.   repaglinide 2 MG tablet Commonly known as: PRANDIN TAKE 1  TABLET (2 MG TOTAL) BY MOUTH 3 (THREE) TIMES DAILY BEFORE MEALS.       Follow-up Information    Marrian Salvage, FNP. Schedule an appointment as soon as possible for a visit in 1 week(s).   Specialty: Internal Medicine Contact information: [place] [address] [phone]              No Known Allergies  Consultations:  Nephrology/IR/urology   Procedures/Studies: CT ABDOMEN PELVIS WO CONTRAST  Result Date: 11/29/2019 CLINICAL DATA:  Status post random renal biopsy the right kidney on 76/16/0737 complicated by hemorrhage. Decrease in hemoglobin during admission after detection of post biopsy hemorrhage. EXAM: CT ABDOMEN AND PELVIS WITHOUT CONTRAST TECHNIQUE: Multidetector CT imaging of the abdomen and pelvis was performed following the standard protocol without IV contrast. COMPARISON:  11/27/2019 FINDINGS: Lower chest: Slight increase in a small right pleural effusion with associated right basilar atelectasis. Hepatobiliary: No focal liver abnormality is seen. No gallstones, gallbladder wall thickening, or biliary dilatation. Pancreas: Unremarkable. No pancreatic ductal dilatation or surrounding inflammatory changes. Spleen: Normal in size without focal abnormality. Adrenals/Urinary Tract: Hyperdense, predominantly subcapsular hemorrhage surrounding the right kidney and predominantly located along the lateral aspect of the kidney shows very slight enlargement since the prior study. Maximal subcapsular hematoma diameter is approximately 9 cm transversely and 12.2 cm in oblique height compared to comparable measurements of 8.8 cm and 12 cm on the prior study. Overall hematoma volume is very similar. There is a small component of additional hemorrhage inferior to the right kidney  in the retroperitoneum which appears stable since the prior study. No intraperitoneal hemorrhage identified. Mass effect on the right kidney again noted without evidence of  hydronephrosis. The left kidney remains stable and unremarkable in appearance. The bladder is unremarkable. Stomach/Bowel: No evidence of bowel obstruction, ileus or free intraperitoneal air. Vascular/Lymphatic: Stable atherosclerosis of the aorta and iliac arteries. No evidence of aneurysm. No enlarged lymph nodes identified. Reproductive: Status post hysterectomy. No adnexal masses. Other: No abdominal wall hernia or abnormality. No abdominopelvic ascites. Musculoskeletal: No acute or significant osseous findings. IMPRESSION: Essentially stable size of predominately subcapsular hemorrhage surrounding the right kidney with minimal increase in hematoma dimensions of only roughly 2 mm. Hemorrhage remains predominantly confined in a subcapsular location around the right kidney with stable small amount of additional retroperitoneal hemorrhage inferior to the right kidney. Electronically Signed   By: Irish Lack M.D.   On: 11/29/2019 12:55   DG Knee 1-2 Views Left  Result Date: 12/01/2019 CLINICAL DATA:  Knee pain EXAM: LEFT KNEE - 1-2 VIEW COMPARISON:  None. FINDINGS: There is tricompartmental osteoarthritis, advanced within the medial compartment joint space loss and marginal osteophyte formation. A superior patellar pole is seen. A small knee joint effusion is noted. Prepatellar subcutaneous edema seen. There is scattered dense vascular calcifications noted. IMPRESSION: Tricompartmental osteoarthritis, advanced within the medial compartment. Electronically Signed   By: Jonna Clark M.D.   On: 12/01/2019 16:13   US RENAL  Result Date: 11/29/2019 CLINICAL DATA:  Recent kidney biopsy 11/25/2019. EXAM: RENAL / URINARY TRACT ULTRASOUND COMPLETE COMPARISON:  CT renal 11/27/2019, CT renal 11/29/2019. FINDINGS: Right Kidney: Renal measurements: 10.8 x 4.4 x 4.8 cm = volume: 117 mL. Echogenicity is increased. There is a subcapsular heterogeneous echogenic fluid collection measuring 9.2 x 3.5 x 7.3 cm. No mass or  hydronephrosis visualized. Vascularity noted to the renal parenchyma. Left Kidney: Renal measurements: 9.2 x 5.6 x 4.2 cm = volume: 114 mL. Echogenicity is increased. No mass or hydronephrosis visualized. Bladder: Appears normal for degree of bladder distention. Other: None. IMPRESSION: 1. A grossly unchanged 9.2 x 3.5 x 7.3 cm right subcapsular hematoma. 2. Increased renal parenchymal echogenicity consistent with known chronic renal disease. Electronically Signed   By: Tish Frederickson M.D.   On: 11/29/2019 20:12   CT L-SPINE NO CHARGE  Result Date: 11/28/2019 CLINICAL DATA:  Initial evaluation for right flank pain status post recent right renal biopsy. EXAM: CT LUMBAR SPINE WITHOUT CONTRAST TECHNIQUE: Multidetector CT imaging of the lumbar spine was performed without intravenous contrast administration. Multiplanar CT image reconstructions were also generated. COMPARISON:  Concomitant CT of the abdomen and pelvis from the same day. FINDINGS: Segmentation: Standard. Alignment: Physiologic.  No listhesis. Vertebrae: Vertebral body height maintained without acute or chronic fracture. Visualized sacrum and pelvis intact. SI joints approximated symmetric. Small benign bone island noted within the mid sacrum. No other discrete or worrisome osseous lesions. Paraspinal and other soft tissues: Right pleural effusion partially visualized. Large right perinephric hematoma with stranding/hemorrhage within the right retroperitoneal space, partially visualized, better seen on concomitant CT of the abdomen and pelvis. Moderate aorto bi-iliac atherosclerotic disease. Disc levels: L1-2:  Negative interspace.  Mild facet hypertrophy.  No stenosis. L2-3: Diffuse disc bulge with disc desiccation. Mild reactive endplate change. Mild facet hypertrophy. Resultant mild spinal stenosis. Foramina remain patent. L3-4: Mild diffuse disc bulge. Mild bilateral facet hypertrophy. Probable mild spinal stenosis. Mild bilateral L3 foraminal  narrowing. L4-5: Advanced degenerative intervertebral disc space narrowing with ankylosis of the L4  and L5 vertebral bodies, and obliteration of the L4-5 interspace. Associated circumferential marginal endplate spurring. Moderate facet and ligament flavum hypertrophy. Resultant moderate spinal stenosis. Moderate to severe bilateral L4 foraminal narrowing. L5-S1: Mild disc bulge. Severe bilateral facet degeneration, right worse than left. Resultant mild narrowing of the lateral recesses bilaterally. Foramina remain patent. IMPRESSION: 1. No acute abnormality within the lumbar spine. 2. Large right perinephric hematoma with stranding/hemorrhage within the right retroperitoneal space, partially visualized, better evaluated on concomitant CT of the abdomen and pelvis. 3. 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 Resultant moderate spinal stenosis with moderate to severe bilateral L4 foraminal narrowing. 4. Additional mild noncompressive disc bulging elsewhere within the lumbar spine as above. No other significant stenosis or neural impingement. 5. Right pleural effusion, partially visualized. 6. Aortic Atherosclerosis (ICD10-I70.0). Electronically Signed   By: Jeannine Boga M.D.   On: 11/28/2019 01:26   CT Renal Stone Study  Result Date: 11/28/2019 CLINICAL DATA:  Right flank pain recent right kidney biopsy. EXAM: CT ABDOMEN AND PELVIS WITHOUT CONTRAST TECHNIQUE: Multidetector CT imaging of the abdomen and pelvis was performed following the standard protocol without IV contrast. COMPARISON:  08/14/2017 FINDINGS: Lower chest: Trace right pleural effusion.  Right base atelectasis. Hepatobiliary: No focal hepatic abnormality. Gallbladder unremarkable. Pancreas: No focal abnormality or ductal dilatation. Spleen: No focal abnormality.  Normal size. Adrenals/Urinary Tract: High density large crescentic shaped fluid collection noted around the right kidney compatible with  perinephric hematoma, 8.9 x 7.4 cm. This compresses the right kidney. Punctate nonobstructing stone in the upper pole of the right kidney. No hydronephrosis. Hematoma also appears to extend around the right adrenal gland. Stranding also continues in the right retroperitoneum inferior to the right kidney. Left adrenal gland is unremarkable. Urinary bladder unremarkable. Stomach/Bowel: Stomach, large and small bowel grossly unremarkable. Vascular/Lymphatic: Heavily calcified aorta and iliac vessels. No aneurysm or adenopathy. Reproductive: No mass. Other: No free fluid or free air. Musculoskeletal: No acute bony abnormality. IMPRESSION: Large right perinephric hematoma measuring 8.9 x 7.4 cm. This has mass effect on the right kidney. 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 Stranding also continues around the right adrenal gland and inferior to the right kidney in the right retroperitoneum. Trace right pleural effusion.  No pneumothorax. These results were called by telephone at the time of interpretation on 11/28/2019 at 12:08 am to provider DAN FLOYD , who verbally acknowledged these results. Electronically Signed   By: Rolm Baptise M.D.   On: 11/28/2019 00:12   US BIOPSY (KIDNEY)  Result Date: 11/25/2019 INDICATION: Proteinuria of uncertain etiology. Please perform ultrasound-guided random renal biopsy for tissue diagnostic purposes. EXAM: ULTRASOUND GUIDED RENAL BIOPSY COMPARISON:  CT abdomen and pelvis-08/14/2017 MEDICATIONS: None. ANESTHESIA/SEDATION: Fentanyl 100 mcg IV; Versed 2 mg IV Total Moderate Sedation time: 10 minutes; The patient was continuously monitored during the procedure by the interventional radiology nurse under my direct supervision. COMPLICATIONS: None immediate. PROCEDURE: Informed written consent was obtained from the patient after a discussion of the risks, benefits and alternatives to treatment. The patient understands and consents the procedure. A  timeout was performed prior to the initiation of the procedure. Ultrasound scanning was performed of the bilateral flanks. The inferior pole of the right kidney was selected for biopsy due to location and sonographic window. The procedure was planned. The operative site was prepped and draped in the usual sterile fashion. The overlying soft tissues were anesthetized with 1% lidocaine with epinephrine. A  67 gauge core needle biopsy device was advanced into the inferior cortex of the right kidney and 2 core biopsies were obtained under direct ultrasound guidance. Images were saved for documentation purposes. The biopsy device was removed and hemostasis was obtained with manual compression. Post procedural scanning was negative for significant post procedural hemorrhage or additional complication. A dressing was placed. The patient tolerated the procedure well without immediate post procedural complication. IMPRESSION: Technically successful ultrasound guided right renal biopsy. Electronically Signed   By: Sandi Mariscal M.D.   On: 11/25/2019 08:54       Subjective: Patient seen and examined at bedside.  Poor historian.  Denies worsening flank pain.  No overnight fever, vomiting or worsening shortness of breath reported.  Still complains of left knee pain.    Discharge Exam: Vitals:   12/03/19 0613 12/03/19 0912  BP: (!) 151/59 (!) 160/62  Pulse: 77 80  Resp: 20   Temp: 98.7 F (37.1 C)   SpO2: 95%     General exam: No acute distress.  Elderly female lying in bed.  Poor historian.  Looks chronically ill.   Respiratory system: Bilateral decreased breath sounds at bases with scattered crackles  cardiovascular system: S1-S2 heard, rate controlled  gastrointestinal system: Abdomen is nondistended, soft and nontender.  Normal bowel sounds heard  genitourinary: Right flank tenderness is improving extremities: Mild lower extremity edema present.  No clubbing.  Mildly swollen left knee  Central nervous  system: Alert very poor historian and slow to respond.  No focal neurological deficits.  Moving extremities Skin: No obvious petechiae/rashes psychiatry: Flat affect   The results of significant diagnostics from this hospitalization (including imaging, microbiology, ancillary and laboratory) are listed below for reference.     Microbiology: Recent Results (from the past 240 hour(s))  Respiratory Panel by RT PCR (Flu A&B, Covid) - Nasopharyngeal Swab     Status: None   Collection Time: 11/28/19  4:55 AM   Specimen: Nasopharyngeal Swab  Result Value Ref Range Status   SARS Coronavirus 2 by RT PCR NEGATIVE NEGATIVE Final    Comment: (NOTE) SARS-CoV-2 target nucleic acids are NOT DETECTED.  The SARS-CoV-2 RNA is generally detectable in upper respiratoy specimens during the acute phase of infection. The lowest concentration of SARS-CoV-2 viral copies this assay can detect is 131 copies/mL. A negative result does not preclude SARS-Cov-2 infection and should not be used as the sole basis for treatment or other patient management decisions. A negative result may occur with  improper specimen collection/handling, submission of specimen other than nasopharyngeal swab, presence of viral mutation(s) within the areas targeted by this assay, and inadequate number of viral copies (<131 copies/mL). A negative result must be combined with clinical observations, patient history, and epidemiological information. The expected result is Negative.  Fact Sheet for Patients:  PinkCheek.be  Fact Sheet for Healthcare Providers:  GravelBags.it  This test is no t yet approved or cleared by the [place] FDA and  has been authorized for detection and/or diagnosis of SARS-CoV-2 by FDA under an Emergency Use Authorization (EUA). This EUA will remain  in effect (meaning this test can be used) for the duration of the COVID-19 declaration under  Section 564(b)(1) of the Act, 21 U.S.C. section 360bbb-3(b)(1), unless the authorization is terminated or revoked sooner.     Influenza A by PCR NEGATIVE NEGATIVE Final   Influenza B by PCR NEGATIVE NEGATIVE Final    Comment: (NOTE) The Xpert Xpress SARS-CoV-2/FLU/RSV assay is intended as an aid  in  the diagnosis of influenza from Nasopharyngeal swab specimens and  should not be used as a sole basis for treatment. Nasal washings and  aspirates are unacceptable for Xpert Xpress SARS-CoV-2/FLU/RSV  testing.  Fact Sheet for Patients: PinkCheek.be  Fact Sheet for Healthcare Providers: GravelBags.it  This test is not yet approved or cleared by the [place] FDA and  has been authorized for detection and/or diagnosis of SARS-CoV-2 by  FDA under an Emergency Use Authorization (EUA). This EUA will remain  in effect (meaning this test can be used) for the duration of the  Covid-19 declaration under Section 564(b)(1) of the Act, 21  U.S.C. section 360bbb-3(b)(1), unless the authorization is  terminated or revoked. Performed at [facility], [place] [address], [place], [place] [address]      Labs: BNP (last 3 results) No results for input(s): BNP in the last 8760 hours. Basic Metabolic Panel: Recent Labs  Lab 11/29/19 0425 11/30/19 0538 12/01/19 0538 12/02/19 0433 12/03/19 0456  NA 134* 133* 134* 135 135  K 4.6 4.2 4.2 4.2 4.3  CL 100 101 104 106 106  CO2 23 19* 20* 20* 21*  GLUCOSE 179* 153* 178* 154* 217*  BUN 72* 80* 84* 88* 86*  CREATININE 3.49* 3.98* 3.73* 3.52* 3.39*  CALCIUM 7.8* 8.1* 7.7* 7.8* 8.2*  MG 2.4  --   --  2.7*  --    Liver Function Tests: Recent Labs  Lab 11/28/19 0500  AST 27  ALT 29  ALKPHOS 87  BILITOT 0.7  PROT 6.4*  ALBUMIN 2.8*   No results for input(s): LIPASE, AMYLASE in the last 168 hours. No results for input(s): AMMONIA in the last 168 hours. CBC: Recent  Labs  Lab 11/29/19 0425 11/29/19 0425 11/29/19 1944 11/30/19 0538 12/01/19 0538 12/02/19 0433 12/03/19 0456  WBC 8.6   < > 9.9 10.5 10.0 10.2 8.9  NEUTROABS 6.2  --   --  7.9* 7.9* 7.4 6.6  HGB 5.1*   < > 7.8* 8.6* 7.5* 7.6* 7.8*  HCT 15.8*   < > 22.3* 25.9* 22.2* 23.6* 24.2*  MCV 98.1   < > 97.0 94.5 97.4 96.7 97.6  PLT 160   < > 135* 150 147* 179 209   < > = values in this interval not displayed.   Cardiac Enzymes: No results for input(s): CKTOTAL, CKMB, CKMBINDEX, TROPONINI in the last 168 hours. BNP: Invalid input(s): POCBNP CBG: Recent Labs  Lab 12/02/19 0748 12/02/19 1131 12/02/19 1724 12/02/19 2217 12/03/19 0737  GLUCAP 175* 188* 134* 224* 208*   D-Dimer No results for input(s): DDIMER in the last 72 hours. Hgb A1c No results for input(s): HGBA1C in the last 72 hours. Lipid Profile No results for input(s): CHOL, HDL, LDLCALC, TRIG, CHOLHDL, LDLDIRECT in the last 72 hours. Thyroid function studies No results for input(s): TSH, T4TOTAL, T3FREE, THYROIDAB in the last 72 hours.  Invalid input(s): FREET3 Anemia work up No results for input(s): VITAMINB12, FOLATE, FERRITIN, TIBC, IRON, RETICCTPCT in the last 72 hours. Urinalysis    Component Value Date/Time   COLORURINE YELLOW 11/27/2019 2136   APPEARANCEUR HAZY (A) 11/27/2019 2136   LABSPEC 1.011 11/27/2019 2136   PHURINE 6.0 11/27/2019 2136   GLUCOSEU 150 (A) 11/27/2019 2136   GLUCOSEU NEGATIVE 09/04/2009 0721   HGBUR SMALL (A) 11/27/2019 2136   BILIRUBINUR NEGATIVE 11/27/2019 2136   BILIRUBINUR negative 09/24/2019 1003   KETONESUR NEGATIVE 11/27/2019 2136   PROTEINUR >=300 (A) 11/27/2019 2136   UROBILINOGEN  0.2 09/24/2019 1003   UROBILINOGEN 0.2 09/04/2009 0721   NITRITE NEGATIVE 11/27/2019 2136   LEUKOCYTESUR NEGATIVE 11/27/2019 2136   Sepsis Labs Invalid input(s): PROCALCITONIN,  WBC,  LACTICIDVEN Microbiology Recent Results (from the past 240 hour(s))  Respiratory Panel by RT PCR (Flu A&B,  Covid) - Nasopharyngeal Swab     Status: None   Collection Time: 11/28/19  4:55 AM   Specimen: Nasopharyngeal Swab  Result Value Ref Range Status   SARS Coronavirus 2 by RT PCR NEGATIVE NEGATIVE Final    Comment: (NOTE) SARS-CoV-2 target nucleic acids are NOT DETECTED.  The SARS-CoV-2 RNA is generally detectable in upper respiratoy specimens during the acute phase of infection. The lowest concentration of SARS-CoV-2 viral copies this assay can detect is 131 copies/mL. A negative result does not preclude SARS-Cov-2 infection and should not be used as the sole basis for treatment or other patient management decisions. A negative result may occur with  improper specimen collection/handling, submission of specimen other than nasopharyngeal swab, presence of viral mutation(s) within the areas targeted by this assay, and inadequate number of viral copies (<131 copies/mL). A negative result must be combined with clinical observations, patient history, and epidemiological information. The expected result is Negative.  Fact Sheet for Patients:  PinkCheek.be  Fact Sheet for Healthcare Providers:  GravelBags.it  This test is no t yet approved or cleared by the [place] FDA and  has been authorized for detection and/or diagnosis of SARS-CoV-2 by FDA under an Emergency Use Authorization (EUA). This EUA will remain  in effect (meaning this test can be used) for the duration of the COVID-19 declaration under Section 564(b)(1) of the Act, 21 U.S.C. section 360bbb-3(b)(1), unless the authorization is terminated or revoked sooner.     Influenza A by PCR NEGATIVE NEGATIVE Final   Influenza B by PCR NEGATIVE NEGATIVE Final    Comment: (NOTE) The Xpert Xpress SARS-CoV-2/FLU/RSV assay is intended as an aid in  the diagnosis of influenza from Nasopharyngeal swab specimens and  should not be used as a sole basis for treatment. Nasal  washings and  aspirates are unacceptable for Xpert Xpress SARS-CoV-2/FLU/RSV  testing.  Fact Sheet for Patients: PinkCheek.be  Fact Sheet for Healthcare Providers: GravelBags.it  This test is not yet approved or cleared by the [place] FDA and  has been authorized for detection and/or diagnosis of SARS-CoV-2 by  FDA under an Emergency Use Authorization (EUA). This EUA will remain  in effect (meaning this test can be used) for the duration of the  Covid-19 declaration under Section 564(b)(1) of the Act, 21  U.S.C. section 360bbb-3(b)(1), unless the authorization is  terminated or revoked. Performed at [facility] [place] [address] [place], [place] [address]      Time coordinating discharge: 35 minutes  SIGNED:   Aline August, MD  [facility] 12/03/2019, 11:03 AM

## 2019-12-03 NOTE — TOC Progression Note (Signed)
[facility] of [facility]) - Progression Note    Patient Details  Name: [name] MRN: [id number] Date of Birth: [birth year]  Transition of [facility]) CM/SW Contact  Purcell Mouton, RN Phone Number: 12/03/2019, 12:37 PM  Clinical Narrative:    Pt and sister [name] at bedside aware of discharge to [facility]. Sister's name in Demographic was Faith, but should be [name]. Pt, [name] and RN made aware that Corey Harold was being called for transportation.    Expected Discharge Plan: [place] Barriers to Discharge: No Barriers Identified  Expected Discharge Plan and Services Expected Discharge Plan: [place] arrangements for the past 2 months: Single Family Home Expected Discharge Date: 12/03/19                                     Social Determinants of Health (SDOH) Interventions    Readmission Risk Interventions No flowsheet data found.

## 2019-12-03 NOTE — Plan of Care (Signed)

## 2019-12-04 DIAGNOSIS — E785 Hyperlipidemia, unspecified: Secondary | ICD-10-CM | POA: Diagnosis not present

## 2019-12-04 DIAGNOSIS — N184 Chronic kidney disease, stage 4 (severe): Secondary | ICD-10-CM | POA: Diagnosis not present

## 2019-12-04 DIAGNOSIS — I10 Essential (primary) hypertension: Secondary | ICD-10-CM | POA: Diagnosis not present

## 2019-12-04 DIAGNOSIS — K219 Gastro-esophageal reflux disease without esophagitis: Secondary | ICD-10-CM | POA: Diagnosis not present

## 2019-12-04 DIAGNOSIS — E1122 Type 2 diabetes mellitus with diabetic chronic kidney disease: Secondary | ICD-10-CM | POA: Diagnosis not present

## 2019-12-04 LAB — SURGICAL PATHOLOGY

## 2019-12-05 ENCOUNTER — Encounter (HOSPITAL_COMMUNITY): Payer: Medicare Other

## 2019-12-05 DIAGNOSIS — M79621 Pain in right upper arm: Secondary | ICD-10-CM | POA: Diagnosis not present

## 2019-12-05 DIAGNOSIS — M25521 Pain in right elbow: Secondary | ICD-10-CM | POA: Diagnosis not present

## 2019-12-10 ENCOUNTER — Ambulatory Visit: Payer: Medicare Other | Admitting: Endocrinology

## 2019-12-11 DIAGNOSIS — D649 Anemia, unspecified: Secondary | ICD-10-CM | POA: Diagnosis not present

## 2019-12-12 ENCOUNTER — Other Ambulatory Visit: Payer: Self-pay | Admitting: Family

## 2019-12-12 DIAGNOSIS — I10 Essential (primary) hypertension: Secondary | ICD-10-CM | POA: Diagnosis not present

## 2019-12-12 DIAGNOSIS — E1122 Type 2 diabetes mellitus with diabetic chronic kidney disease: Secondary | ICD-10-CM | POA: Diagnosis not present

## 2019-12-12 DIAGNOSIS — M1712 Unilateral primary osteoarthritis, left knee: Secondary | ICD-10-CM | POA: Diagnosis not present

## 2019-12-12 DIAGNOSIS — E039 Hypothyroidism, unspecified: Secondary | ICD-10-CM | POA: Diagnosis not present

## 2019-12-12 DIAGNOSIS — K219 Gastro-esophageal reflux disease without esophagitis: Secondary | ICD-10-CM | POA: Diagnosis not present

## 2019-12-12 DIAGNOSIS — E785 Hyperlipidemia, unspecified: Secondary | ICD-10-CM | POA: Diagnosis not present

## 2019-12-13 ENCOUNTER — Encounter: Payer: Self-pay | Admitting: Family

## 2019-12-13 ENCOUNTER — Ambulatory Visit: Payer: Medicare Other | Admitting: Family

## 2019-12-13 DIAGNOSIS — M25562 Pain in left knee: Secondary | ICD-10-CM

## 2019-12-16 ENCOUNTER — Encounter: Payer: Self-pay | Admitting: Family

## 2019-12-16 ENCOUNTER — Other Ambulatory Visit: Payer: Self-pay

## 2019-12-16 ENCOUNTER — Ambulatory Visit (INDEPENDENT_AMBULATORY_CARE_PROVIDER_SITE_OTHER): Payer: Medicare Other | Admitting: Family

## 2019-12-16 VITALS — BP 158/80 | HR 61 | Temp 97.6°F | Ht 62.0 in | Wt 151.6 lb

## 2019-12-16 DIAGNOSIS — I10 Essential (primary) hypertension: Secondary | ICD-10-CM | POA: Diagnosis not present

## 2019-12-16 DIAGNOSIS — Z23 Encounter for immunization: Secondary | ICD-10-CM | POA: Diagnosis not present

## 2019-12-16 DIAGNOSIS — E113599 Type 2 diabetes mellitus with proliferative diabetic retinopathy without macular edema, unspecified eye: Secondary | ICD-10-CM | POA: Diagnosis not present

## 2019-12-16 DIAGNOSIS — E785 Hyperlipidemia, unspecified: Secondary | ICD-10-CM | POA: Diagnosis not present

## 2019-12-16 MED ORDER — AMLODIPINE BESYLATE 10 MG PO TABS: 10.0000 mg | ORAL_TABLET | Freq: Every day | ORAL | 0 refills | Status: DC

## 2019-12-16 NOTE — Progress Notes (Signed)
[name] is a 69 y.o. 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, unspecified laterality, unspecified whether long term insulin use ([place]) 06/13/2016   Vitamin D deficiency 01/12/2016   Medicare annual wellness visit, initial 01/11/2016   Lacunar infarct, acute ([place]) 03/23/2015   Non compliance w medication regimen 01/16/2015   Anemia 01/08/2015   Stroke ([place]) 01/08/2015   Hyperlipemia    Carotid bruit present 12/13/2012   Routine health maintenance 10/24/2010   PERIPHERAL EDEMA 09/10/2009   ELECTROCARDIOGRAM, ABNORMAL 04/11/2008   HEART MURMUR, SYSTOLIC 94/50/3888   SEBACEOUS CYST, NECK 12/13/2006   Hyperthyroidism 09/27/2006   Secondary diabetes with peripheral vascular disease ([place]) 09/27/2006   Hyperlipidemia LDL goal <70 09/27/2006   TOBACCO ABUSE 09/27/2006   Essential hypertension 09/27/2006    Current Outpatient Medications  Medication Sig Dispense Refill   atorvastatin (LIPITOR) 80 MG tablet TAKE 1 TABLET BY MOUTH EVERY DAY (Patient taking differently: Take 80 mg by mouth daily. ) 90 tablet 3   Blood Glucose Monitoring Suppl (ONETOUCH VERIO FLEX SYSTEM) w/Device KIT 1 each by Does not apply route 2 (two) times daily. E11.9     bromocriptine (PARLODEL) 2.5 MG tablet TAKE 1/2 TABLET BY MOUTH EVERY DAY (Patient taking differently: Take 2.5 mg by mouth daily. ) 45 tablet 1   calcitRIOL (ROCALTROL) 0.25 MCG capsule Take 0.25 mcg by mouth daily.     cloNIDine (CATAPRES) 0.2 MG tablet Take 1  tablet (0.2 mg total) by mouth 3 (three) times daily. 270 tablet 3   glucose blood (ONETOUCH VERIO) test strip USE AS DIRECTED TWICE DAILY 200 strip 3   HYDROcodone-acetaminophen (NORCO/VICODIN) 5-325 MG tablet Take 1-2 tablets by mouth every 6 (six) hours as needed for moderate pain. 14 tablet 0   metoprolol tartrate (LOPRESSOR) 50 MG tablet Take 1 tablet (50 mg total) by mouth 2 (two) times daily. 60 tablet 0   minoxidil (LONITEN) 2.5 MG tablet Take 2.5 mg by mouth 3 (three) times daily.     OneTouch Delica Lancets 28M MISC 1 each by Does not apply route 2 (two) times daily. E11.9     pantoprazole (PROTONIX) 40 MG tablet Take 1 tablet (40 mg total) by mouth daily. 90 tablet 3   polyethylene glycol (MIRALAX / GLYCOLAX) 17 g packet Take 34-51 g by mouth daily as needed for moderate constipation.     repaglinide (PRANDIN) 2 MG tablet TAKE 1 TABLET (2 MG TOTAL) BY MOUTH 3 (THREE) TIMES DAILY BEFORE MEALS. 270 tablet 3   amLODipine (NORVASC) 10 MG tablet Take 1 tablet (10 mg total) by mouth daily. 90 tablet 0   No current facility-administered medications for this visit.    Allergies: Patient has no known allergies.  Past Medical History:  Diagnosis Date   Acute kidney injury ([place]) 01/08/2015   Arthritis    Chronic back pain    Constipation    Cough    Diabetes mellitus, type 2 ([place])    Diverticulosis    Fibroid    patient thinks this was the reason for her hysterectomy  GERD (gastroesophageal reflux disease)    Heart murmur    History of sebaceous cyst    Hyperlipemia    Hyperplastic colon polyp    Hypertension    Hyperthyroidism    Lumbar radiculopathy    Shortness of breath 09/13/2013   Stroke ([place]) 2015?   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 Zigmund Daniel    Family History  Problem Relation Age of Onset   Hypertension Mother    Sleep apnea Mother    Diabetes Mother    Hyperlipidemia Mother    Lung cancer Father        smoker   Hypertension Sister    Diabetes Sister    Hypertension Brother    Hypertension Sister    Diabetes Brother    Hypertension Brother    Breast cancer Neg Hx    Colon cancer Neg Hx    Esophageal cancer Neg Hx    Pancreatic cancer Neg Hx    Liver disease Neg Hx    Colon polyps Neg Hx     Social History   Tobacco Use   Smoking status: Former Smoker    Packs/day: 0.50    Years: 35.00    Pack years: 17.50    Types: Cigarettes    Quit date: 07/07/2014    Years since quitting: 5.4   Smokeless tobacco: Never Used  Substance Use Topics   Alcohol use: No    Subjective:  Patient had a kidney biopsy at the end of October; unfortunately, there were complications along with a secondary fall and she was hospitalized from 11/3-11/9; she was sent to SNF for rehab from the hospital and was discharged last week; she was recommended to see her orthopedist ( which she has already done) as well as her nephrologist and PCP upon follow-up; she is not able to see her nephrologist until January 2022; she is concerned about her blood pressure medications- she notes she was started back on Amlodipine while in the nursing facility and needs a refill on this medication; she is also overdue to see her endocrinologist for a diabetes check up- she was in the SNF on 11/16 when her last appointment was scheduled with Dr. Loanne Drilling.   She would like to get her flu shot today; overdue for Tdap as well; planning for COVID booster in a few weeks;   Objective:  Vitals:   12/16/19 0857  BP: (!) 158/80  Pulse: 61  Temp: 97.6 F (36.4 C)  TempSrc: Oral  SpO2: 99%  Weight: 151 lb 9.6 oz (68.8 kg)  Height: _0  (1.575 m)    General: Well developed, well nourished, in no  acute distress  Skin : Warm and dry.  Head: Normocephalic and atraumatic  Lungs: Respirations unlabored; clear to auscultation bilaterally without wheeze, rales, rhonchi  CVS exam: normal rate and regular rhythm.  Abdomen: Soft; nontender; nondistended; normoactive bowel sounds; no masses or hepatosplenomegaly  Musculoskeletal: No deformities; no active joint inflammation  Extremities: No edema, cyanosis, clubbing  Vessels: Symmetric bilaterally  Neurologic: Alert and oriented; speech intact; face symmetrical; moves all extremities well; CNII-XII intact without focal deficit   Assessment:  1. Essential hypertension   2. Needs flu shot  3. Need for Tdap vaccination   4. 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 Hyperlipidemia, unspecified hyperlipidemia type     Plan:  Extensive medication review done with patient; she is confused about whether she should be on Amlodipine; in reviewing our records, it has not been written by anyone in this office since 2017; she does not know if her nephrologist has been writing for her; since her pressure is elevated today, will do short term refill and she is to discuss further with her nephrologist; Flu and Tdap given; Re-schedule missed appointment with endocrinology; Stay on Atorvastatin;  Time spent 30 minutes;  This visit occurred during the SARS-CoV-2 public health emergency.  Safety protocols were in place, including screening questions prior to the visit, additional usage of staff PPE, and extensive cleaning of exam room while observing appropriate contact time as indicated for disinfecting solutions.     No follow-ups on file.  Orders Placed This Encounter  Procedures   Tdap vaccine greater than or equal to 7yo IM   Flu Vaccine QUAD High Dose(Fluad)    Requested Prescriptions   Signed Prescriptions Disp Refills    amLODipine (NORVASC) 10 MG tablet 90 tablet 0    Sig: Take 1 tablet (10 mg total) by mouth daily.

## 2019-12-16 NOTE — Patient Instructions (Signed)
Please schedule a follow-up with Dr. Loanne Drilling for your 3 month diabetes follow-up; Keep the follow-up with nephrology as scheduled- make sure to get them to clarify if they want you on Amlodipine for the long term;  Plan for your COVID booster in 2 weeks;   We will get you your flu shot and Tdap updated today;

## 2019-12-23 ENCOUNTER — Encounter: Payer: Self-pay | Admitting: Endocrinology

## 2019-12-23 ENCOUNTER — Other Ambulatory Visit: Payer: Self-pay

## 2019-12-23 ENCOUNTER — Ambulatory Visit (INDEPENDENT_AMBULATORY_CARE_PROVIDER_SITE_OTHER): Payer: Medicare Other | Admitting: Endocrinology

## 2019-12-23 VITALS — BP 172/80 | HR 64 | Ht 62.0 in | Wt 147.0 lb

## 2019-12-23 DIAGNOSIS — E113599 Type 2 diabetes mellitus with proliferative diabetic retinopathy without macular edema, unspecified eye: Secondary | ICD-10-CM

## 2019-12-23 DIAGNOSIS — E1165 Type 2 diabetes mellitus with hyperglycemia: Secondary | ICD-10-CM | POA: Diagnosis not present

## 2019-12-23 LAB — POCT GLYCOSYLATED HEMOGLOBIN (HGB A1C): Hemoglobin A1C: 6.8 % — AB (ref 4.0–5.6)

## 2019-12-23 NOTE — Patient Instructions (Addendum)
Please continue the same 2 diabetes medications. A different type of diabetes blood test is requested for you today.  We'll let you know about the results.  check your blood sugar once a day.  vary the time of day when you check, between before the 3 meals, and at bedtime.  also check if you have symptoms of your blood sugar being too high or too low.  please keep a record of the readings and bring it to your next appointment here (or you can bring the meter itself).  You can write it on any piece of paper.  please call us sooner if your blood sugar goes below 70, or if you have a lot of readings over 200.   Please follow up with your kidney doctor, for the blood pressure.   Please come back for a follow-up appointment in 3 months.

## 2019-12-23 NOTE — Progress Notes (Signed)
Subjective:    Patient ID: [name], female    DOB: 1950-01-25, 69 y.o.   MRN: [id number]  HPI Pt returns for f/u of diabetes mellitus:  DM type: 2, but she may be developing type 1 Dx'ed: 1443 Complications: polyneuropathy, stage 5 CRI, CVA's, and PDR.   Therapy: 2 oral meds GDM: never DKA: never Severe hypoglycemia: never.   Pancreatitis: never Pancreatic imaging: never Other: she cannot afford brand-name meds; she has never been on insulin, but she has taken trulicity; edema limits rx options.   Interval history: no cbg record, but states cbg's are well-controlled.  pt states she feels better in general, since recent illness.  Past Medical History:  Diagnosis Date  . Acute kidney injury ([place]) 01/08/2015  . Arthritis   . Chronic back pain   . Constipation   . Cough   . Diabetes mellitus, type 2 ([place])   . Diverticulosis   . Fibroid    patient thinks this was the reason for her hysterectomy  . GERD (gastroesophageal reflux disease)   . Heart murmur   . History of sebaceous cyst   . Hyperlipemia   . Hyperplastic colon polyp   . Hypertension   . Hyperthyroidism   . Lumbar radiculopathy   . Shortness of breath 09/13/2013  . Stroke ([place]) 2015?   x4   . Tobacco abuse   . 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 ABDOMINAL HYSTERECTOMY     --?ovaries remain  . COLONOSCOPY W/ BIOPSIES  09/11/2008  . RETINOPATHY SURGERY Bilateral 2013   Dr. Zigmund Daniel    Social History   Socioeconomic History  . Marital status: Divorced    Spouse name: Not on file  . Number of children: 1  . Years of education: 3  . Highest education level: Not on file  Occupational History  . 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 Smoking status: Former Smoker    Packs/day: 0.50    Years: 35.00    Pack years: 17.50    Types: Cigarettes     Quit date: 07/07/2014    Years since quitting: 5.4  . Smokeless tobacco: Never Used  Vaping Use  . Vaping Use: Never used  Substance and Sexual Activity  . Alcohol use: No  . Drug use: No  . Sexual activity: Never    Partners: Male    Birth control/protection: Surgical    Comment: TAH--?ovaries remain  Other Topics Concern  . Not on file  Social History Narrative   Divorced 1 son    Retired Museum/gallery curator for [employer]   Former smoker no alcohol caffeine or drug use report denies abuse and feels safe at home.   Social Determinants of Health   Financial Resource Strain: Medium Risk  . Difficulty of Paying Living Expenses: Somewhat hard  Food Insecurity: Food Insecurity Present  . Worried About Charity fundraiser in the Last Year: Often true  . Ran Out of Food in the Last Year: Sometimes true  Transportation Needs: No Transportation Needs  . Lack of Transportation (Medical): No  . Lack of Transportation (Non-Medical): No  Physical Activity: Sufficiently Active  . Days of Exercise per Week: 5 days  . Minutes of Exercise per Session: 30 min  Stress: No Stress Concern Present  . Feeling of Stress : Not  at all  Social Connections:   . Frequency of Communication with Friends and Family: Not on file  . Frequency of Social Gatherings with Friends and Family: Not on file  . Attends Religious Services: Not on file  . Active Member of Clubs or Organizations: Not on file  . Attends Archivist Meetings: Not on file  . Marital Status: Not on file  Intimate Partner Violence: Not At Risk  . Fear of Current or Ex-Partner: No  . Emotionally Abused: No  . Physically Abused: No  . Sexually Abused: No    Current Outpatient Medications on File Prior to Visit  Medication Sig Dispense Refill  . amLODipine (NORVASC) 10 MG tablet Take 1 tablet (10 mg total) by mouth daily. 90 tablet 0  . atorvastatin (LIPITOR) 80 MG tablet TAKE 1 TABLET BY MOUTH EVERY DAY (Patient taking  differently: Take 80 mg by mouth daily. ) 90 tablet 3  . Blood Glucose Monitoring Suppl (ONETOUCH VERIO FLEX SYSTEM) w/Device KIT 1 each by Does not apply route 2 (two) times daily. E11.9    . bromocriptine (PARLODEL) 2.5 MG tablet TAKE 1/2 TABLET BY MOUTH EVERY DAY (Patient taking differently: Take 2.5 mg by mouth daily. ) 45 tablet 1  . calcitRIOL (ROCALTROL) 0.25 MCG capsule Take 0.25 mcg by mouth daily.    . cloNIDine (CATAPRES) 0.2 MG tablet Take 1 tablet (0.2 mg total) by mouth 3 (three) times daily. 270 tablet 3  . glucose blood (ONETOUCH VERIO) test strip USE AS DIRECTED TWICE DAILY 200 strip 3  . HYDROcodone-acetaminophen (NORCO/VICODIN) 5-325 MG tablet Take 1-2 tablets by mouth every 6 (six) hours as needed for moderate pain. 14 tablet 0  . metoprolol tartrate (LOPRESSOR) 50 MG tablet Take 1 tablet (50 mg total) by mouth 2 (two) times daily. 60 tablet 0  . minoxidil (LONITEN) 2.5 MG tablet Take 2.5 mg by mouth 3 (three) times daily.    [name] Delica Lancets 67R MISC 1 each by Does not apply route 2 (two) times daily. E11.9    . pantoprazole (PROTONIX) 40 MG tablet Take 1 tablet (40 mg total) by mouth daily. 90 tablet 3  . polyethylene glycol (MIRALAX / GLYCOLAX) 17 g packet Take 34-51 g by mouth daily as needed for moderate constipation.    . repaglinide (PRANDIN) 2 MG tablet TAKE 1 TABLET (2 MG TOTAL) BY MOUTH 3 (THREE) TIMES DAILY BEFORE MEALS. 270 tablet 3   No current facility-administered medications on file prior to visit.    No Known Allergies  Family History  Problem Relation Age of Onset  . Hypertension Mother   . Sleep apnea Mother   . Diabetes Mother   . Hyperlipidemia Mother   . Lung cancer Father        smoker  . Hypertension Sister   . Diabetes Sister   . Hypertension Brother   . Hypertension Sister   . Diabetes Brother   . Hypertension Brother   . Breast cancer Neg Hx   . Colon cancer Neg Hx   . Esophageal cancer Neg Hx   . Pancreatic cancer Neg Hx    . Liver disease Neg Hx   . Colon polyps Neg Hx     BP (!) 172/80   Pulse 64   Ht $R'5\' 2"'cX$  (1.575 m)   Wt 147 lb (66.7 kg)   SpO2 98%   BMI 26.89 kg/m    Review of Systems She denies hypoglycemia    Objective:   Physical  Exam VITAL SIGNS:  See vs page GENERAL: no distress Pulses: dorsalis pedis intact bilat.   MSK: no deformity of the feet CV: 2+ left and 1+ right leg edema Skin:  no ulcer on the feet.  normal color and temp on the feet.   Neuro: sensation is intact to touch on the feet.     Lab Results  Component Value Date   HGBA1C 6.8 (A) 12/23/2019   Lab Results  Component Value Date   CREATININE 3.39 (H) 12/03/2019   BUN 86 (H) 12/03/2019   NA 135 12/03/2019   K 4.3 12/03/2019   CL 106 12/03/2019   CO2 21 (L) 12/03/2019       Assessment & Plan:  Type 2 DM, with PN: uncertain glycemic control Anemia: this may be suppressing A1c.  Check fructosamine  HTN: is noted today  Patient Instructions  Please continue the same 2 diabetes medications. A different type of diabetes blood test is requested for you today.  We'll let you know about the results.  check your blood sugar once a day.  vary the time of day when you check, between before the 3 meals, and at bedtime.  also check if you have symptoms of your blood sugar being too high or too low.  please keep a record of the readings and bring it to your next appointment here (or you can bring the meter itself).  You can write it on any piece of paper.  please call us sooner if your blood sugar goes below 70, or if you have a lot of readings over 200.   Please follow up with your kidney doctor, for the blood pressure.   Please come back for a follow-up appointment in 3 months.

## 2019-12-25 ENCOUNTER — Telehealth: Payer: Self-pay | Admitting: Family

## 2019-12-25 ENCOUNTER — Other Ambulatory Visit: Payer: Self-pay | Admitting: Endocrinology

## 2019-12-25 DIAGNOSIS — M25462 Effusion, left knee: Secondary | ICD-10-CM | POA: Diagnosis not present

## 2019-12-25 DIAGNOSIS — K219 Gastro-esophageal reflux disease without esophagitis: Secondary | ICD-10-CM | POA: Diagnosis not present

## 2019-12-25 DIAGNOSIS — D696 Thrombocytopenia, unspecified: Secondary | ICD-10-CM | POA: Diagnosis not present

## 2019-12-25 DIAGNOSIS — E1122 Type 2 diabetes mellitus with diabetic chronic kidney disease: Secondary | ICD-10-CM | POA: Diagnosis not present

## 2019-12-25 DIAGNOSIS — N184 Chronic kidney disease, stage 4 (severe): Secondary | ICD-10-CM | POA: Diagnosis not present

## 2019-12-25 DIAGNOSIS — E1165 Type 2 diabetes mellitus with hyperglycemia: Secondary | ICD-10-CM | POA: Diagnosis not present

## 2019-12-25 DIAGNOSIS — K5790 Diverticulosis of intestine, part unspecified, without perforation or abscess without bleeding: Secondary | ICD-10-CM | POA: Diagnosis not present

## 2019-12-25 DIAGNOSIS — E039 Hypothyroidism, unspecified: Secondary | ICD-10-CM | POA: Diagnosis not present

## 2019-12-25 DIAGNOSIS — I129 Hypertensive chronic kidney disease with stage 1 through stage 4 chronic kidney disease, or unspecified chronic kidney disease: Secondary | ICD-10-CM | POA: Diagnosis not present

## 2019-12-25 DIAGNOSIS — N179 Acute kidney failure, unspecified: Secondary | ICD-10-CM | POA: Diagnosis not present

## 2019-12-25 DIAGNOSIS — E785 Hyperlipidemia, unspecified: Secondary | ICD-10-CM | POA: Diagnosis not present

## 2019-12-25 DIAGNOSIS — M1712 Unilateral primary osteoarthritis, left knee: Secondary | ICD-10-CM | POA: Diagnosis not present

## 2019-12-25 LAB — FRUCTOSAMINE: Fructosamine: 349 umol/L — ABNORMAL HIGH (ref 205–285)

## 2019-12-25 MED ORDER — ACARBOSE 25 MG PO TABS: 12.5000 mg | ORAL_TABLET | Freq: Three times a day (TID) | ORAL | 5 refills | Status: DC

## 2019-12-25 NOTE — Telephone Encounter (Signed)
Kristi with [facility] called and said they were going to see the patient for frequency of 1w5 and she is requesting a pt eval.

## 2019-12-26 NOTE — Telephone Encounter (Signed)
Do we have a number to reach this person? I am not sure exactly what they are requesting. If it's just verbal orders and you have a number, will you please forward to [facility].  Okay to give verbal orders as requested.

## 2019-12-26 NOTE — Telephone Encounter (Signed)
Kristi's number is [phone].

## 2019-12-26 NOTE — Telephone Encounter (Signed)
Returned [name]'s call and left a voicemail for her to return our call as we were calling to receive clarifications on what the verbal orders was needed for

## 2019-12-27 ENCOUNTER — Ambulatory Visit: Payer: Medicare Other | Admitting: Endocrinology

## 2019-12-27 DIAGNOSIS — D696 Thrombocytopenia, unspecified: Secondary | ICD-10-CM | POA: Diagnosis not present

## 2019-12-27 DIAGNOSIS — N184 Chronic kidney disease, stage 4 (severe): Secondary | ICD-10-CM | POA: Diagnosis not present

## 2019-12-27 DIAGNOSIS — I129 Hypertensive chronic kidney disease with stage 1 through stage 4 chronic kidney disease, or unspecified chronic kidney disease: Secondary | ICD-10-CM | POA: Diagnosis not present

## 2019-12-27 DIAGNOSIS — K219 Gastro-esophageal reflux disease without esophagitis: Secondary | ICD-10-CM | POA: Diagnosis not present

## 2019-12-27 DIAGNOSIS — E785 Hyperlipidemia, unspecified: Secondary | ICD-10-CM | POA: Diagnosis not present

## 2019-12-27 DIAGNOSIS — E1122 Type 2 diabetes mellitus with diabetic chronic kidney disease: Secondary | ICD-10-CM | POA: Diagnosis not present

## 2019-12-27 DIAGNOSIS — M1712 Unilateral primary osteoarthritis, left knee: Secondary | ICD-10-CM | POA: Diagnosis not present

## 2019-12-27 DIAGNOSIS — N179 Acute kidney failure, unspecified: Secondary | ICD-10-CM | POA: Diagnosis not present

## 2019-12-27 DIAGNOSIS — K5790 Diverticulosis of intestine, part unspecified, without perforation or abscess without bleeding: Secondary | ICD-10-CM | POA: Diagnosis not present

## 2019-12-27 DIAGNOSIS — E1165 Type 2 diabetes mellitus with hyperglycemia: Secondary | ICD-10-CM | POA: Diagnosis not present

## 2019-12-27 DIAGNOSIS — M25462 Effusion, left knee: Secondary | ICD-10-CM | POA: Diagnosis not present

## 2019-12-27 DIAGNOSIS — E039 Hypothyroidism, unspecified: Secondary | ICD-10-CM | POA: Diagnosis not present

## 2019-12-27 NOTE — Telephone Encounter (Signed)
Verbal orders eft on [employer].

## 2019-12-27 NOTE — Telephone Encounter (Signed)
Okay to give orders as requested;  

## 2019-12-27 NOTE — Telephone Encounter (Signed)
Called Drue Dun again to verify what they are requesting for [name], left a voicemail for Drue Dun to call us back for clarification.

## 2019-12-27 NOTE — Telephone Encounter (Addendum)
   [place] Name: [facility] Agency Name: [facility] Call[facility] Phone #: [phone] Service Requested: Nursing and PT eval   Nursing 1x week 4 PT eval

## 2019-12-31 ENCOUNTER — Telehealth: Payer: Self-pay | Admitting: Family

## 2019-12-31 NOTE — Telephone Encounter (Signed)
Verbal left on [employer].

## 2019-12-31 NOTE — Telephone Encounter (Signed)
   [place] Name: [facility] Agency Name: [place] Phone #: [phone] Service Requested: PT Frequency of Visits: 1W1, [phone]

## 2019-12-31 NOTE — Telephone Encounter (Signed)
Okay to give orders as requested;  

## 2020-01-01 ENCOUNTER — Telehealth: Payer: Self-pay | Admitting: Family

## 2020-01-01 DIAGNOSIS — D696 Thrombocytopenia, unspecified: Secondary | ICD-10-CM | POA: Diagnosis not present

## 2020-01-01 DIAGNOSIS — I129 Hypertensive chronic kidney disease with stage 1 through stage 4 chronic kidney disease, or unspecified chronic kidney disease: Secondary | ICD-10-CM | POA: Diagnosis not present

## 2020-01-01 DIAGNOSIS — N179 Acute kidney failure, unspecified: Secondary | ICD-10-CM | POA: Diagnosis not present

## 2020-01-01 DIAGNOSIS — E1122 Type 2 diabetes mellitus with diabetic chronic kidney disease: Secondary | ICD-10-CM | POA: Diagnosis not present

## 2020-01-01 DIAGNOSIS — M1712 Unilateral primary osteoarthritis, left knee: Secondary | ICD-10-CM | POA: Diagnosis not present

## 2020-01-01 DIAGNOSIS — K5790 Diverticulosis of intestine, part unspecified, without perforation or abscess without bleeding: Secondary | ICD-10-CM | POA: Diagnosis not present

## 2020-01-01 DIAGNOSIS — E785 Hyperlipidemia, unspecified: Secondary | ICD-10-CM | POA: Diagnosis not present

## 2020-01-01 DIAGNOSIS — M25462 Effusion, left knee: Secondary | ICD-10-CM | POA: Diagnosis not present

## 2020-01-01 DIAGNOSIS — K219 Gastro-esophageal reflux disease without esophagitis: Secondary | ICD-10-CM | POA: Diagnosis not present

## 2020-01-01 DIAGNOSIS — N184 Chronic kidney disease, stage 4 (severe): Secondary | ICD-10-CM | POA: Diagnosis not present

## 2020-01-01 DIAGNOSIS — E1165 Type 2 diabetes mellitus with hyperglycemia: Secondary | ICD-10-CM | POA: Diagnosis not present

## 2020-01-01 DIAGNOSIS — E039 Hypothyroidism, unspecified: Secondary | ICD-10-CM | POA: Diagnosis not present

## 2020-01-01 NOTE — Telephone Encounter (Signed)
With her known kidney disease, please ask the patient to go ahead and schedule a follow-up with her nephrologist.

## 2020-01-01 NOTE — Telephone Encounter (Signed)
[name] from [place] called    Blood pressure was 197/85 when she checked it today. She was asymptomatic and moving. Patient mentioned that when she checked it the systolic was 226.

## 2020-01-01 NOTE — Telephone Encounter (Signed)
Follow up with patient she state she has an appt on 12/14 with her kidney doctor.

## 2020-01-02 DIAGNOSIS — K219 Gastro-esophageal reflux disease without esophagitis: Secondary | ICD-10-CM | POA: Diagnosis not present

## 2020-01-02 DIAGNOSIS — E1165 Type 2 diabetes mellitus with hyperglycemia: Secondary | ICD-10-CM | POA: Diagnosis not present

## 2020-01-02 DIAGNOSIS — M1712 Unilateral primary osteoarthritis, left knee: Secondary | ICD-10-CM | POA: Diagnosis not present

## 2020-01-02 DIAGNOSIS — N179 Acute kidney failure, unspecified: Secondary | ICD-10-CM | POA: Diagnosis not present

## 2020-01-02 DIAGNOSIS — E785 Hyperlipidemia, unspecified: Secondary | ICD-10-CM | POA: Diagnosis not present

## 2020-01-02 DIAGNOSIS — K5790 Diverticulosis of intestine, part unspecified, without perforation or abscess without bleeding: Secondary | ICD-10-CM | POA: Diagnosis not present

## 2020-01-02 DIAGNOSIS — M25462 Effusion, left knee: Secondary | ICD-10-CM | POA: Diagnosis not present

## 2020-01-02 DIAGNOSIS — E039 Hypothyroidism, unspecified: Secondary | ICD-10-CM | POA: Diagnosis not present

## 2020-01-02 DIAGNOSIS — I129 Hypertensive chronic kidney disease with stage 1 through stage 4 chronic kidney disease, or unspecified chronic kidney disease: Secondary | ICD-10-CM | POA: Diagnosis not present

## 2020-01-02 DIAGNOSIS — E1122 Type 2 diabetes mellitus with diabetic chronic kidney disease: Secondary | ICD-10-CM | POA: Diagnosis not present

## 2020-01-02 DIAGNOSIS — D696 Thrombocytopenia, unspecified: Secondary | ICD-10-CM | POA: Diagnosis not present

## 2020-01-02 DIAGNOSIS — N184 Chronic kidney disease, stage 4 (severe): Secondary | ICD-10-CM | POA: Diagnosis not present

## 2020-01-03 ENCOUNTER — Encounter: Payer: Self-pay | Admitting: Family

## 2020-01-03 ENCOUNTER — Ambulatory Visit (INDEPENDENT_AMBULATORY_CARE_PROVIDER_SITE_OTHER): Payer: Medicare Other | Admitting: Family

## 2020-01-03 ENCOUNTER — Telehealth: Payer: Self-pay | Admitting: Pharmacist

## 2020-01-03 DIAGNOSIS — M25562 Pain in left knee: Secondary | ICD-10-CM

## 2020-01-03 MED ORDER — METHYLPREDNISOLONE ACETATE 40 MG/ML IJ SUSP
40.0000 mg | INTRAMUSCULAR | Status: AC | PRN
  Administered 2020-01-03: 40 mg via INTRA_ARTICULAR

## 2020-01-03 MED ORDER — LIDOCAINE HCL 1 % IJ SOLN
5.0000 mL | INTRAMUSCULAR | Status: AC | PRN
  Administered 2020-01-03: 5 mL

## 2020-01-03 NOTE — Progress Notes (Addendum)
Office Visit Note   Patient: [name]           Date of Birth: 12-31-1950           MRN: [id number] Visit Date: 12/13/2019              Requested by: Marrian Salvage, [address] [place],  [place] [address] PCP: Marrian Salvage, FNP  Chief Complaint  Patient presents with  . Left Knee - Pain      HPI: The patient is a 69 year old woman who presents today for routine evaluation of the left knee pain.  This is been gradually worsening she is complaining of medial as well as lateral joint line pain.  Complaining of associated swelling loss of range of motion especially flexion stiffness.  She has increased pain with ambulation she has been using a walker  Did have radiographs November 7 which were revealing for advanced osteoarthritis, especially of the medial joint line.  Assessment & Plan: Visit Diagnoses:  1. Acute pain of left knee     Plan: offered Depomedrol injection left knee. Will follow up in office in 3-4 weeks as needed.   Follow-Up Instructions: Return in about 3 weeks (around 01/03/2020).   Left Ankle Exam   Tenderness  The patient is experiencing tenderness in the lateral malleolus and medial malleolus.  Swelling: moderate  Range of Motion  The patient has normal left ankle ROM.   Muscle Strength  The patient has normal left ankle strength.  Other  Erythema: absent      Patient is alert, oriented, no adenopathy, well-dressed, normal affect, normal respiratory effort.   Imaging: No results found. No images are attached to the encounter.  Labs: Lab Results  Component Value Date   HGBA1C 6.8 (A) 12/23/2019   HGBA1C 8.0 (H) 11/29/2019   HGBA1C 8.3 (H) 11/28/2019   ESRSEDRATE 47 (H) 09/08/2017   LABURIC 10.0 (H) 02/24/2017     Lab Results  Component Value Date   ALBUMIN 2.8 (L) 11/28/2019   ALBUMIN 2.4 (L) 11/07/2019   ALBUMIN 4.1 07/11/2018   LABURIC 10.0 (H) 02/24/2017    Lab Results  Component Value  Date   MG 2.7 (H) 12/02/2019   MG 2.4 11/29/2019   MG 2.1 09/09/2017   Lab Results  Component Value Date   VD25OH 33.21 07/11/2018   VD25OH >120.00 (HH) 09/08/2017   VD25OH 10.99 (L) 01/11/2016    No results found for: PREALBUMIN CBC EXTENDED Latest Ref Rng & Units 12/03/2019 12/02/2019 12/01/2019  WBC 4.0 - 10.5 K/uL 8.9 10.2 10.0  RBC 3.87 - 5.11 MIL/uL 2.48(L) 2.44(L) 2.28(L)  HGB 12.0 - 15.0 g/dL 7.8(L) 7.6(L) 7.5(L)  HCT 36.0 - 46.0 % 24.2(L) 23.6(L) 22.2(L)  PLT 150 - 400 K/uL 209 179 147(L)  NEUTROABS 1.7 - 7.7 K/uL 6.6 7.4 7.9(H)  LYMPHSABS 0.7 - 4.0 K/uL 1.0 1.3 1.1     There is no height or weight on file to calculate BMI.  Orders:  No orders of the defined types were placed in this encounter.  No orders of the defined types were placed in this encounter.    Procedures: Large Joint Inj: L knee on 01/03/2020 10:22 AM Indications: pain Details: 18 G 1.5 in needle, anteromedial approach Medications: 5 mL lidocaine 1 %; 40 mg methylPREDNISolone acetate 40 MG/ML Consent was given by the patient.      Clinical Data: No additional findings.  ROS:  All other systems negative, except as  noted in the HPI. Review of Systems  Constitutional: Negative for chills and fever.  Musculoskeletal: Positive for arthralgias and gait problem. Negative for joint swelling.  Neurological: Negative for weakness.    Objective: Vital Signs: There were no vitals taken for this visit.  Specialty Comments:  No specialty comments available.  PMFS History: Patient Active Problem List   Diagnosis Date Noted  . Right flank discomfort 11/28/2019  . Vitamin D toxicity, accidental or unintentional, initial encounter 09/09/2017  . Ataxia 09/09/2017  . Type 2 diabetes mellitus with hyperglycemia ([place]) 09/09/2017  . Chronic kidney disease (CKD)   . Olecranon bursitis of right elbow 03/09/2017  . Thyroid nodule 06/29/2016  . 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 Vitamin D deficiency 01/12/2016  . Medicare annual wellness visit, initial 01/11/2016  . Lacunar infarct, acute ([place]) 03/23/2015  . Non compliance w medication regimen 01/16/2015  . Anemia 01/08/2015  . Stroke ([place]) 01/08/2015  . Hyperlipemia   . Carotid bruit present 12/13/2012  . Routine health maintenance 10/24/2010  . PERIPHERAL EDEMA 09/10/2009  . ELECTROCARDIOGRAM, ABNORMAL 04/11/2008  . HEART MURMUR, SYSTOLIC 96/78/9381  . SEBACEOUS CYST, NECK 12/13/2006  . Hyperthyroidism 09/27/2006  . Secondary diabetes with peripheral vascular disease ([place]) 09/27/2006  . Hyperlipidemia LDL goal <70 09/27/2006  . TOBACCO ABUSE 09/27/2006  . Essential hypertension 09/27/2006   Past Medical History:  Diagnosis Date  . Acute kidney injury ([place]) 01/08/2015  . Arthritis   . Chronic back pain   . Constipation   . Cough   . Diabetes mellitus, type 2 ([place])   . Diverticulosis   . Fibroid    patient thinks this was the reason for her hysterectomy  . GERD (gastroesophageal reflux disease)   . Heart murmur   . History of sebaceous cyst   . Hyperlipemia   . Hyperplastic colon polyp   . Hypertension   . Hyperthyroidism   . Lumbar radiculopathy   . Shortness of breath 09/13/2013  . Stroke ([place]) 2015?   x4   . Tobacco abuse   . 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 Hypertension Mother   . Sleep apnea Mother   . Diabetes Mother   . Hyperlipidemia Mother   . Lung cancer Father        smoker  . Hypertension Sister   . Diabetes Sister   . Hypertension Brother   . Hypertension Sister   . Diabetes Brother   . Hypertension Brother   . Breast cancer Neg Hx   . Colon cancer Neg Hx   . Esophageal cancer Neg Hx   . Pancreatic cancer Neg Hx   . Liver disease Neg Hx   . Colon polyps Neg Hx     Past  Surgical History:  Procedure Laterality Date  . ABDOMINAL HYSTERECTOMY     --?ovaries remain  . COLONOSCOPY W/ BIOPSIES  09/11/2008  . RETINOPATHY SURGERY Bilateral 2013   Dr. Zigmund Daniel   Social History   Occupational History  . 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 Smoking status: Former Smoker    Packs/day: 0.50    Years: 35.00    Pack years: 17.50    Types: Cigarettes    Quit date: 07/07/2014  Years since quitting: 5.4  . Smokeless tobacco: Never Used  Vaping Use  . Vaping Use: Never used  Substance and Sexual Activity  . Alcohol use: No  . Drug use: No  . Sexual activity: Never    Partners: Male    Birth control/protection: Surgical    Comment: TAH--?ovaries remain

## 2020-01-03 NOTE — Progress Notes (Signed)
Office Visit Note   Patient: [name]           Date of Birth: 1951/01/16           MRN: [id number] Visit Date: 01/03/2020              Requested by: Marrian Salvage, [place],  [place] [address] PCP: Marrian Salvage, FNP  No chief complaint on file.     HPI: The patient is a 69 year old woman who presents today in follow-up for left knee pain.  A little over 3 weeks ago the patient was walking out to her mailbox when she had a fall she is not sure whether she twisted or pivoted but she did have a fall did not have direct impact on her knee  Following this was having medial joint line pain as well as some deep knee pain she is 3 weeks out from a Depo-Medrol injection and has also been doing physical therapy for the last 3 weeks  Today she complains of some mild medial joint line pain this only occurs after prolonged weightbearing is comfortable with the progress that she has been making she does ambulate with a straight cane . No mechanical symptoms does complain of some crepitation  Assessment & Plan: Visit Diagnoses:  1. Acute pain of left knee     Plan: We will continue with physical therapy and conservative measures for the next 4 weeks.  If she fails to improve as she hopes in about 4 weeks we can consider ordering an MRI of the left knee  Follow-Up Instructions: No follow-ups on file.   Left Knee Exam   Muscle Strength  The patient has normal left knee strength.  Tenderness  The patient is experiencing tenderness in the medial joint line.  Range of Motion  The patient has normal left knee ROM.  Tests  Varus: negative Valgus: negative  Other  Erythema: absent Effusion: no effusion present      Patient is alert, oriented, no adenopathy, well-dressed, normal affect, normal respiratory effort.   Imaging: No results found. No images are attached to the encounter.  Labs: Lab Results  Component Value Date    HGBA1C 6.8 (A) 12/23/2019   HGBA1C 8.0 (H) 11/29/2019   HGBA1C 8.3 (H) 11/28/2019   ESRSEDRATE 47 (H) 09/08/2017   LABURIC 10.0 (H) 02/24/2017     Lab Results  Component Value Date   ALBUMIN 2.8 (L) 11/28/2019   ALBUMIN 2.4 (L) 11/07/2019   ALBUMIN 4.1 07/11/2018   LABURIC 10.0 (H) 02/24/2017    Lab Results  Component Value Date   MG 2.7 (H) 12/02/2019   MG 2.4 11/29/2019   MG 2.1 09/09/2017   Lab Results  Component Value Date   VD25OH 33.21 07/11/2018   VD25OH >120.00 (HH) 09/08/2017   VD25OH 10.99 (L) 01/11/2016    No results found for: PREALBUMIN CBC EXTENDED Latest Ref Rng & Units 12/03/2019 12/02/2019 12/01/2019  WBC 4.0 - 10.5 K/uL 8.9 10.2 10.0  RBC 3.87 - 5.11 MIL/uL 2.48(L) 2.44(L) 2.28(L)  HGB 12.0 - 15.0 g/dL 7.8(L) 7.6(L) 7.5(L)  HCT 36.0 - 46.0 % 24.2(L) 23.6(L) 22.2(L)  PLT 150 - 400 K/uL 209 179 147(L)  NEUTROABS 1.7 - 7.7 K/uL 6.6 7.4 7.9(H)  LYMPHSABS 0.7 - 4.0 K/uL 1.0 1.3 1.1     There is no height or weight on file to calculate BMI.  Orders:  No orders of the defined types were placed in  this encounter.  No orders of the defined types were placed in this encounter.    Procedures: No procedures performed  Clinical Data: No additional findings.  ROS:  All other systems negative, except as noted in the HPI. Review of Systems  Constitutional: Negative for chills and fever.  Musculoskeletal: Positive for arthralgias. Negative for joint swelling.    Objective: Vital Signs: There were no vitals taken for this visit.  Specialty Comments:  No specialty comments available.  PMFS History: Patient Active Problem List   Diagnosis Date Noted  . Right flank discomfort 11/28/2019  . Vitamin D toxicity, accidental or unintentional, initial encounter 09/09/2017  . Ataxia 09/09/2017  . Type 2 diabetes mellitus with hyperglycemia ([place]) 09/09/2017  . Chronic kidney disease (CKD)   . Olecranon bursitis of right elbow 03/09/2017  . Thyroid  nodule 06/29/2016  . 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 Vitamin D deficiency 01/12/2016  . Medicare annual wellness visit, initial 01/11/2016  . Lacunar infarct, acute ([place]) 03/23/2015  . Non compliance w medication regimen 01/16/2015  . Anemia 01/08/2015  . Stroke ([place]) 01/08/2015  . Hyperlipemia   . Carotid bruit present 12/13/2012  . Routine health maintenance 10/24/2010  . PERIPHERAL EDEMA 09/10/2009  . ELECTROCARDIOGRAM, ABNORMAL 04/11/2008  . HEART MURMUR, SYSTOLIC 43/15/4008  . SEBACEOUS CYST, NECK 12/13/2006  . Hyperthyroidism 09/27/2006  . Secondary diabetes with peripheral vascular disease ([place]) 09/27/2006  . Hyperlipidemia LDL goal <70 09/27/2006  . TOBACCO ABUSE 09/27/2006  . Essential hypertension 09/27/2006   Past Medical History:  Diagnosis Date  . Acute kidney injury ([place]) 01/08/2015  . Arthritis   . Chronic back pain   . Constipation   . Cough   . Diabetes mellitus, type 2 ([place])   . Diverticulosis   . Fibroid    patient thinks this was the reason for her hysterectomy  . GERD (gastroesophageal reflux disease)   . Heart murmur   . History of sebaceous cyst   . Hyperlipemia   . Hyperplastic colon polyp   . Hypertension   . Hyperthyroidism   . Lumbar radiculopathy   . Shortness of breath 09/13/2013  . Stroke ([place]) 2015?   x4   . Tobacco abuse   . 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 Hypertension Mother   . Sleep apnea Mother   . Diabetes Mother   . Hyperlipidemia Mother   . Lung cancer Father        smoker  . Hypertension Sister   . Diabetes Sister   . Hypertension Brother   . Hypertension Sister   . Diabetes Brother   . Hypertension Brother   . Breast cancer Neg Hx   . Colon cancer Neg Hx   . Esophageal cancer Neg Hx   .  Pancreatic cancer Neg Hx   . Liver disease Neg Hx   . Colon polyps Neg Hx     Past Surgical History:  Procedure Laterality Date  . ABDOMINAL HYSTERECTOMY     --?ovaries remain  . COLONOSCOPY W/ BIOPSIES  09/11/2008  . RETINOPATHY SURGERY Bilateral 2013   Dr. Zigmund Daniel   Social History   Occupational History  . 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 Smoking status: Former Smoker  Packs/day: 0.50    Years: 35.00    Pack years: 17.50    Types: Cigarettes    Quit date: 07/07/2014    Years since quitting: 5.4  . Smokeless tobacco: Never Used  Vaping Use  . Vaping Use: Never used  Substance and Sexual Activity  . Alcohol use: No  . Drug use: No  . Sexual activity: Never    Partners: Male    Birth control/protection: Surgical    Comment: TAH--?ovaries remain

## 2020-01-03 NOTE — Addendum Note (Signed)
Addended by: Dondra Prader R on: 01/03/2020 10:23 AM   Modules accepted: Level of Service

## 2020-01-03 NOTE — Progress Notes (Signed)
Chronic Care Management Pharmacy Assistant   Name: [name]  MRN: [id number] DOB: Dec 14, 1950  Reason for Encounter: Initial Questions   PCP : Marrian Salvage, FNP  Allergies:  No Known Allergies  Medications: Outpatient Encounter Medications as of 01/03/2020  Medication Sig Note  . acarbose (PRECOSE) 25 MG tablet Take 0.5 tablets (12.5 mg total) by mouth 3 (three) times daily with meals.   [name] amLODipine (NORVASC) 10 MG tablet Take 1 tablet (10 mg total) by mouth daily.   [name] atorvastatin (LIPITOR) 80 MG tablet TAKE 1 TABLET BY MOUTH EVERY DAY (Patient taking differently: Take 80 mg by mouth daily. )   . Blood Glucose Monitoring Suppl (ONETOUCH VERIO FLEX SYSTEM) w/Device KIT 1 each by Does not apply route 2 (two) times daily. E11.9   . bromocriptine (PARLODEL) 2.5 MG tablet TAKE 1/2 TABLET BY MOUTH EVERY DAY (Patient taking differently: Take 2.5 mg by mouth daily. )   . calcitRIOL (ROCALTROL) 0.25 MCG capsule Take 0.25 mcg by mouth daily.   . cloNIDine (CATAPRES) 0.2 MG tablet Take 1 tablet (0.2 mg total) by mouth 3 (three) times daily.   [name] glucose blood (ONETOUCH VERIO) test strip USE AS DIRECTED TWICE DAILY   . HYDROcodone-acetaminophen (NORCO/VICODIN) 5-325 MG tablet Take 1-2 tablets by mouth every 6 (six) hours as needed for moderate pain.   . metoprolol tartrate (LOPRESSOR) 50 MG tablet Take 1 tablet (50 mg total) by mouth 2 (two) times daily.   . minoxidil (LONITEN) 2.5 MG tablet Take 2.5 mg by mouth 3 (three) times daily.   [name] Delica Lancets 99M MISC 1 each by Does not apply route 2 (two) times daily. E11.9   . pantoprazole (PROTONIX) 40 MG tablet Take 1 tablet (40 mg total) by mouth daily.   . polyethylene glycol (MIRALAX / GLYCOLAX) 17 g packet Take 34-51 g by mouth daily as needed for moderate constipation. 11/20/2019: Pt does 2 to 3 top fulls   . repaglinide (PRANDIN) 2 MG tablet TAKE 1 TABLET (2 MG TOTAL) BY MOUTH 3 (THREE) TIMES DAILY BEFORE MEALS.     No facility-administered encounter medications on file as of 01/03/2020.    Current Diagnosis: Patient Active Problem List   Diagnosis Date Noted  . Right flank discomfort 11/28/2019  . Vitamin D toxicity, accidental or unintentional, initial encounter 09/09/2017  . Ataxia 09/09/2017  . Type 2 diabetes mellitus with hyperglycemia ([place]) 09/09/2017  . Chronic kidney disease (CKD)   . Olecranon bursitis of right elbow 03/09/2017  . Thyroid nodule 06/29/2016  . 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 Vitamin D deficiency 01/12/2016  . Medicare annual wellness visit, initial 01/11/2016  . Lacunar infarct, acute ([place]) 03/23/2015  . Non compliance w medication regimen 01/16/2015  . Anemia 01/08/2015  . Stroke ([place]) 01/08/2015  . Hyperlipemia   . Carotid bruit present 12/13/2012  . Routine health maintenance 10/24/2010  . PERIPHERAL EDEMA 09/10/2009  . ELECTROCARDIOGRAM, ABNORMAL 04/11/2008  . HEART MURMUR, SYSTOLIC 42/68/3419  . SEBACEOUS CYST, NECK 12/13/2006  . Hyperthyroidism 09/27/2006  . Secondary diabetes with peripheral vascular disease ([place]) 09/27/2006  . Hyperlipidemia LDL goal <70 09/27/2006  . TOBACCO ABUSE 09/27/2006  . Essential hypertension 09/27/2006    Goals Addressed   None     Follow-Up:  Pharmacist Review  Have you seen any other providers since your last visit? The patient last seen Dr. Loanne Drilling  on 12/23/19  Any changes in your medications or health? The patient stated that Dr. Loanne Drilling prescribed acarbose   Any side effects from any medications? The patient states that she does not have any side affects from medications  Do you have an symptoms or problems not managed by your medications? The patient has no symptoms or problems not managed by medications  Any concerns about your health right now? The patient states that her  concern is about her kidneys and not wanting to be on dialysis    Has your provider asked that you check blood pressure, blood sugar, or follow special diet at home? The patient states that she is suppose to check her blood sugar every day and keep a log  Do you get any type of exercise on a regular basis? The patient states that she is doing therapy at least twice a week but it is for her left knee. She stated that for a time she use to walk for exercise  Can you think of a goal you would like to reach for your health? The patient states that her health goal is to not have to be on dialysis   Do you have any problems getting your medications? The patient stated that she has not problems with getting her medications from the pharmacy  Is there anything that you would like to discuss during the appointment? The patient states that she does not have anything specific to discuss at this time  Please bring medications and supplements to appointment   Wendy Poet, [place]

## 2020-01-06 ENCOUNTER — Ambulatory Visit: Payer: Medicare Other | Admitting: Pharmacist

## 2020-01-06 ENCOUNTER — Other Ambulatory Visit: Payer: Self-pay

## 2020-01-06 DIAGNOSIS — I10 Essential (primary) hypertension: Secondary | ICD-10-CM

## 2020-01-06 DIAGNOSIS — E113599 Type 2 diabetes mellitus with proliferative diabetic retinopathy without macular edema, unspecified eye: Secondary | ICD-10-CM

## 2020-01-06 DIAGNOSIS — E785 Hyperlipidemia, unspecified: Secondary | ICD-10-CM

## 2020-01-06 NOTE — Patient Instructions (Addendum)
Visit Information  Phone number for Pharmacist: [phone]  Thank you for meeting with me to discuss your medications! I look forward to working with you to achieve your health care goals. Below is a summary of what we talked about during the visit:  Goals Addressed            This Visit's Progress   . Pharmacy Care Plan       CARE PLAN ENTRY (see longitudinal plan of care for additional care plan information)  Current Barriers:  . Chronic Disease Management support, education, and care coordination needs related to Hypertension, Hyperlipidemia, and Diabetes   Hypertension BP Readings from Last 3 Encounters:  12/23/19 (!) 172/80  12/16/19 (!) 158/80  12/03/19 (!) 160/60 .  Pharmacist Clinical Goal(s): o Over the next 90 days, patient will work with PharmD and providers to achieve BP goal <140/90 . Current regimen:  o Amlodipine 10 mg daily o Metoprolol succinate 100 mg daily o Clonidine 0.2 mg 3 times daily o Minoxidil 2.5 mg 3 times daily . Interventions: o Discussed BP goals and benefits of medications for prevention of heart attack / stroke . Patient self care activities - Over the next 90 days, patient will: o Check BP daily, document, and provide at future appointments o Ensure daily salt intake < 2300 mg/day  Hyperlipidemia Lab Results  Component Value Date/Time   LDLCALC 80 07/11/2018 12:58 PM   LDLDIRECT 79.0 07/10/2017 01:59 PM .  Pharmacist Clinical Goal(s): o Over the next 90 days, patient will work with PharmD and providers to achieve LDL goal < 70 . Current regimen:  o Atorvastatin 80 mg daily . Interventions: o Discussed cholesterol goals and benefits of medications for prevention of heart attack / stroke . Patient self care activities - Over the next 90 days, patient will: o Continue current medications  Diabetes Lab Results  Component Value Date/Time   HGBA1C 6.8 (A) 12/23/2019 09:09 AM   HGBA1C 8.0 (H) 11/29/2019 04:25 AM   HGBA1C 8.3 (H)  11/28/2019 05:00 AM .  Pharmacist Clinical Goal(s): o Over the next 90 days, patient will work with PharmD and providers to achieve A1c goal <8% . Current regimen:  o Acarbose 25 mg - 1/2 tab 3 times daily o Repaglinide 2 mg 3 times daily o Bromocriptine 2.5 mg daily . Interventions: o Discussed importance of maintaining sugars at goal to prevent complications of diabetes including kidney damage, retinal damage, and cardiovascular disease . Patient self care activities - Over the next 90 days, patient will: o Check blood sugar once daily, document, and provide at future appointments o Contact provider with any episodes of hypoglycemia  Medication management . Pharmacist Clinical Goal(s): o Over the next 90 days, patient will work with PharmD and providers to maintain optimal medication adherence . Current pharmacy: [facility] . Interventions o Comprehensive medication review performed. o Continue current medication management strategy . Patient self care activities - Over the next 90 days, patient will: o Focus on medication adherence by fill date o Take medications as prescribed o Report any questions or concerns to PharmD and/or provider(s)  Initial goal documentation      Ms. Ziska was given information about Chronic Care Management services today including:  1. CCM service includes personalized support from designated clinical staff supervised by her physician, including individualized plan of care and coordination with other care providers 2. 24/7 contact phone numbers for assistance for urgent and routine care needs. 3. Standard insurance, coinsurance,  copays and deductibles apply for chronic care management only during months in which we provide at least 20 minutes of these services. Most insurances cover these services at 100%, however patients may be responsible for any copay, coinsurance and/or deductible if applicable. This service may help you avoid the  need for more expensive face-to-face services. 4. Only one practitioner may furnish and bill the service in a calendar month. 5. The patient may stop CCM services at any time (effective at the end of the month) by phone call to the office staff.  Patient agreed to services and verbal consent obtained.   The patient verbalized understanding of instructions, educational materials, and care plan provided today and agreed to receive a mailed copy of patient instructions, educational materials, and care plan.  Telephone follow up appointment with pharmacy team member scheduled for: 3 months  Charlene Brooke, PharmD, BCACP Clinical Pharmacist [facility] [place] [facility] Chronic Kidney Disease When your kidneys are not working well, they cannot remove waste and excess substances from your blood as effectively as they did before. This can lead to a buildup and imbalance of these substances, which can worsen kidney damage and affect how your body functions. Certain foods lead to a buildup of these substances in the body. By changing your diet as recommended by your diet and nutrition specialist (dietitian) or health care provider, you could help prevent further kidney damage and delay or prevent the need for dialysis. What are tips for following this plan? General instructions   Work with your health care provider and dietitian to develop a meal plan that is right for you. Foods you can eat, limit, or avoid will be different for each person depending on the stage of kidney disease and any other existing health conditions.  Talk with your health care provider about whether you should take a vitamin and mineral supplement.  Use standard measuring cups and spoons to measure servings of foods. Use a kitchen scale to measure portions of protein foods.  If directed by your health care provider, avoid drinking too much fluid. Measure and count all liquids, including  water, ice, soups, flavored gelatin, and frozen desserts such as popsicles or ice cream. Reading food labels  Check the amount of sodium in foods. Choose foods that have less than 300 milligrams (mg) per serving.  Check the ingredient list for phosphorus or potassium-based additives or preservatives.  Check the amount of saturated and trans fat. Limit or avoid these fats as told by your dietitian. Shopping  Avoid buying foods that are: ? Processed, frozen, or prepackaged. ? Calcium-enriched or fortified.  Do not buy foods that have salt or sodium listed among the first five ingredients.  Do not buy canned vegetables. Cooking  Replace animal proteins, such as meat, fish, eggs, or dairy, with plant proteins from beans, nuts, and soy. ? Use soy milk instead of cow's milk. ? Add beans or tofu to soups, casseroles, or pasta dishes instead of meat.  Soak vegetables, such as potatoes, before cooking to reduce potassium. To do this: ? Peel and cut into small pieces. ? Soak in warm water for at least 2 hours. For every 1 cup of vegetables, use 10 cups of water. ? Drain and rinse with warm water. ? Boil for at least 5 minutes. Meal planning  Limit the amount of protein from plant and animal sources you eat each day.  Do not add salt to food when cooking or  before eating.  Eat meals and snacks at around the same time each day. If you have diabetes:  If you have diabetes (diabetes mellitus) and chronic kidney disease, it is important to keep your blood glucose in the target range recommended by your health care provider. Follow your diabetes management plan. This may include: ? Checking your blood glucose regularly. ? Taking oral medicines, insulin, or both. ? Exercising for at least 30 minutes on 5 or more days each week, or as told by your health care provider. ? Tracking how many servings of carbohydrates you eat at each meal.  You may be given specific guidelines on how much of  certain foods and nutrients you may eat, depending on your stage of kidney disease and whether you have high blood pressure (hypertension). Follow your meal plan as told by your dietitian. What nutrients should be limited? The items listed are not a complete list. Talk with your dietitian about what dietary choices are best for you. Potassium Potassium affects how steadily your heart beats. If too much potassium builds up in your blood, it can cause an irregular heartbeat or even a heart attack. You may need to eat less potassium, depending on your blood potassium levels and the stage of kidney disease. Talk to your dietitian about how much potassium you may have each day. You may need to limit or avoid foods that are high in potassium, such as:  Milk and soy milk.  Fruits, such as bananas, papaya, apricots, nectarines, melon, prunes, raisins, kiwi, and oranges.  Vegetables, such as potatoes, sweet potatoes, yams, tomatoes, leafy greens, beets, okra, avocado, pumpkin, and winter squash.  White and lima beans. Phosphorus Phosphorus is a mineral found in your bones. A balance between calcium and phosphorous is needed to build and maintain healthy bones. Too much phosphorus pulls calcium from your bones. This can make your bones weak and more likely to break. Too much phosphorus can also make your skin itch. You may need to eat less phosphorus depending on your blood phosphorus levels and the stage of kidney disease. Talk to your dietitian about how much potassium you may have each day. You may need to take medicine to lower your blood phosphorus levels if diet changes do not help. You may need to limit or avoid foods that are high in phosphorus, such as:  Milk and dairy products.  Dried beans and peas.  Tofu, soy milk, and other soy-based meat replacements.  Colas.  Nuts and peanut butter.  Meat, poultry, and fish.  Bran cereals and oatmeals. Protein Protein helps you to make and keep  muscle. It also helps in the repair of your body's cells and tissues. One of the natural breakdown products of protein is a waste product called urea. When your kidneys are not working properly, they cannot remove wastes, such as urea, like they did before you developed chronic kidney disease. Reducing how much protein you eat can help prevent a buildup of urea in your blood. Depending on your stage of kidney disease, you may need to limit foods that are high in protein. Sources of animal protein include:  Meat (all types).  Fish and seafood.  Poultry.  Eggs.  Dairy. Other protein foods include:  Beans and legumes.  Nuts and nut butter.  Soy and tofu. Sodium Sodium, which is found in salt, helps maintain a healthy balance of fluids in your body. Too much sodium can increase your blood pressure and have a negative effect on the  function of your heart and lungs. Too much sodium can also cause your body to retain too much fluid, making your kidneys work harder. Most people should have less than 2,300 milligrams (mg) of sodium each day. If you have hypertension, you may need to limit your sodium to 1,500 mg each day. Talk to your dietitian about how much sodium you may have each day. You may need to limit or avoid foods that are high in sodium, such as:  Salt seasonings.  Soy sauce.  Cured and processed meats.  Salted crackers and snack foods.  Fast food.  Canned soups and most canned foods.  Pickled foods.  Vegetable juice.  Boxed mixes or ready-to-eat boxed meals and side dishes.  Bottled dressings, sauces, and marinades. Summary  Chronic kidney disease can lead to a buildup and imbalance of waste and excess substances in the body. Certain foods lead to a buildup of these substances. By adjusting your intake of these foods, you could help prevent more kidney damage and delay or prevent the need for dialysis.  Food adjustments are different for each person with chronic  kidney disease. Work with a dietitian to set up nutrient goals and a meal plan that is right for you.  If you have diabetes and chronic kidney disease, it is important to keep your blood glucose in the target range recommended by your health care provider. This information is not intended to replace advice given to you by your health care provider. Make sure you discuss any questions you have with your health care provider. Document Revised: 05/03/2018 Document Reviewed: 01/06/2016 Elsevier Patient Education  2020 [employer].

## 2020-01-06 NOTE — Chronic Care Management (AMB) (Signed)
Chronic Care Management Pharmacy  Name: [name]  MRN: [id number] DOB: 06-16-1950   Chief Complaint/ HPI  [name],  69 y.o. , female presents for her Initial CCM visit with the clinical pharmacist via telephone due to COVID-19 Pandemic.  PCP : Marrian Salvage, [place] Patient Care Team: Marrian Salvage, [place] as PCP - General (Internal Medicine) Hayden Pedro, MD (Ophthalmology) Charlton Haws, [facility] as Pharmacist (Pharmacist)  Patient's chronic conditions include: Hypertension, Hyperlipidemia, Diabetes, Chronic Kidney Disease and Tobacco use, Hx CVA  Office Visits: 12/16/19 NP Jodi Mourning OV: hospital f/u. Amlodipine was added @ SNF. Flu shot and TDAP given, plan for covid booster in 2 weeks.  Consult Visit: 12/23/19 Dr Loanne Drilling (endocrine): DM dx 2007, edema limits Rx options. Estimated A1c from fructosamine is 8.5%. Added acarbose 1/2 pill TID.  11/27/19-12/03/19 hospitalized s/p fall. Discharged to SNF. STOP amlodipine 10 mg, aspirin, furosemide, metoprolol succinate, telmisartan-HCTZ. Started metoprolol tartrate 50 mg BID  10/22/19 Dr Joelyn Oms (nephrology): ordered renal biopsy due to worsening Cr and proteinuia.  No Known Allergies  Medications: Outpatient Encounter Medications as of 01/06/2020  Medication Sig Note  . acarbose (PRECOSE) 25 MG tablet Take 0.5 tablets (12.5 mg total) by mouth 3 (three) times daily with meals.   [name] amLODipine (NORVASC) 10 MG tablet Take 1 tablet (10 mg total) by mouth daily.   [name] atorvastatin (LIPITOR) 80 MG tablet TAKE 1 TABLET BY MOUTH EVERY DAY (Patient taking differently: Take 80 mg by mouth daily. )   . Blood Glucose Monitoring Suppl (ONETOUCH VERIO FLEX SYSTEM) w/Device KIT 1 each by Does not apply route 2 (two) times daily. E11.9   . bromocriptine (PARLODEL) 2.5 MG tablet TAKE 1/2 TABLET BY MOUTH EVERY DAY (Patient taking differently: Take 2.5 mg by mouth daily. )   . calcitRIOL (ROCALTROL) 0.25 MCG capsule  Take 0.25 mcg by mouth daily.   . cloNIDine (CATAPRES) 0.2 MG tablet Take 1 tablet (0.2 mg total) by mouth 3 (three) times daily.   [name] glucose blood (ONETOUCH VERIO) test strip USE AS DIRECTED TWICE DAILY   . HYDROcodone-acetaminophen (NORCO/VICODIN) 5-325 MG tablet Take 1-2 tablets by mouth every 6 (six) hours as needed for moderate pain.   . metoprolol tartrate (LOPRESSOR) 50 MG tablet Take 1 tablet (50 mg total) by mouth 2 (two) times daily.   . minoxidil (LONITEN) 2.5 MG tablet Take 2.5 mg by mouth 3 (three) times daily.   [name] Delica Lancets 60F MISC 1 each by Does not apply route 2 (two) times daily. E11.9   . pantoprazole (PROTONIX) 40 MG tablet Take 1 tablet (40 mg total) by mouth daily.   . polyethylene glycol (MIRALAX / GLYCOLAX) 17 g packet Take 34-51 g by mouth daily as needed for moderate constipation. 11/20/2019: Pt does 2 to 3 top fulls   . repaglinide (PRANDIN) 2 MG tablet TAKE 1 TABLET (2 MG TOTAL) BY MOUTH 3 (THREE) TIMES DAILY BEFORE MEALS.    No facility-administered encounter medications on file as of 01/06/2020.    Wt Readings from Last 3 Encounters:  12/23/19 147 lb (66.7 kg)  12/16/19 151 lb 9.6 oz (68.8 kg)  12/03/19 124 lb 11.2 oz (56.6 kg)   Lab Results  Component Value Date   CREATININE 3.39 (H) 12/03/2019   BUN 86 (H) 12/03/2019   GFR 25.74 (L) 07/11/2018   GFRNONAA 14 (L) 12/03/2019   GFRAA 25 (L) 09/09/2017   NA 135 12/03/2019   K 4.3 12/03/2019  CALCIUM 8.2 (L) 12/03/2019   CO2 21 (L) 12/03/2019     Current Diagnosis/Assessment:    Goals Addressed   None     Hypertension   BP goal is:  <140/90  Office blood pressures are  BP Readings from Last 3 Encounters:  12/23/19 (!) 172/80  12/16/19 (!) 158/80  12/03/19 (!) 160/60   Patient checks BP at home daily Patient home BP readings are ranging: SBP 140s-170s  Patient has failed these meds in the past: furosemide, telmisartan-hctz Patient is currently uncontrolled on the  following medications:  . Amlodipine 10 mg daily . Metoprolol succinate 100 mg daily . Clonidine 0.2 mg TID . Minoxidil 2.5 mg TID . Furosemide 40 mg MWF  We discussed: BP goals; verified that patient has stopped telmisartan-HCTZ, however she is still taking furosemide 3 times a week despite order to hold med at discharge from hospital 12/03/19; advised pt to hold med until f/u with nephrologist -BP is high and pt has appt with nephrologist tomorrow to discuss options  Plan  Continue current medications   Hyperlipidemia   LDL goal < 100 Hx CVA  Last lipids Lab Results  Component Value Date   CHOL 155 07/11/2018   HDL 44.70 07/11/2018   LDLCALC 80 07/11/2018   LDLDIRECT 79.0 07/10/2017   TRIG 151.0 (H) 07/11/2018   CHOLHDL 3 07/11/2018   Hepatic Function Latest Ref Rng & Units 11/28/2019 11/07/2019 09/24/2019  Total Protein 6.5 - 8.1 g/dL 6.4(L) - 7.7  Albumin 3.5 - 5.0 g/dL 2.8(L) 2.4(L) -  AST 15 - 41 U/L 27 - 22  ALT 0 - 44 U/L 29 - 19  Alk Phosphatase 38 - 126 U/L 87 - -  Total Bilirubin 0.3 - 1.2 mg/dL 0.7 - 1.1  Bilirubin, Direct 0.0 - 0.2 mg/dL - - -    The ASCVD Risk score ([place]., et al., 2013) failed to calculate for the following reasons:   The patient has a prior MI or stroke diagnosis   Patient has failed these meds in past: n/a Patient is currently uncontrolled on the following medications:  . Atorvastatin 80 mg daily  We discussed:  Cholesterol goals; benefits of statin for ASCVD risk reduction; most recent LDL is not at goal however this was over 2 years ago; pt needs updated lipid panel to determine therapy options, pt has been overwhelmed with appts since hospital discharge last month so it is reasonable to wait to schedule f/u with PCP -may consider ezetimibe if LDL > 70  Plan  Continue current medications Schedule chronic f/u with PCP for lipid panel  Diabetes   A1c goal <8%  Follows w/ Dr Loanne Drilling Dx 2007. Uses fructosamine to estimate A1c  given anemia.  Recent Relevant Labs: Lab Results  Component Value Date/Time   HGBA1C 6.8 (A) 12/23/2019 09:09 AM   HGBA1C 8.0 (H) 11/29/2019 04:25 AM   HGBA1C 8.3 (H) 11/28/2019 05:00 AM   FRUCTOSAMINE 349 (H) 12/23/2019 09:30 AM   FRUCTOSAMINE 341 (H) 07/22/2016 03:18 PM   GFR 25.74 (L) 07/11/2018 12:58 PM   GFR 31.36 (L) 09/13/2017 01:57 PM   MICROALBUR 12.8 (H) 08/28/2015 11:49 AM   MICROALBUR 140.0 Verified by manual dilution. mg/dL (H) 04/09/2008 09:37 AM    Lab Results  Component Value Date/Time   FRUCTOSAMINE 349 (H) 12/23/2019 09:30 AM   FRUCTOSAMINE 341 (H) 07/22/2016 03:18 PM   Last diabetic Eye exam:  Lab Results  Component Value Date/Time   HMDIABEYEEXA No Retinopathy 02/01/2018 12:00  AM    Last diabetic Foot exam: No results found for: HMDIABFOOTEX   Checking BG: Daily  Recent FBG Readings: 97; no readings above 200 per pt report  Patient has failed these meds in past: Trulicity, Tradjenta, metformin, Januvia Patient is currently controlled on the following medications: . Acarbose 25 mg - 1/2 tab TID . Repaglinide 2 mg TID . Bromocriptine 2.5 mg - 1 tab daily . Testing supplies  We discussed: Discussed importance of maintaining sugars at goal to prevent complications of diabetes including kidney damage, retinal damage, and cardiovascular disease -pt denies side effects/issues since starting acarbose; pt denies BG > 200 or < 70 in past 2 weeks; pt cannot provide specific readings except for this morning (97)  Plan  Continue current medications and control with diet and exercise  CKD IV   Lab Results  Component Value Date/Time   PTH 111 (H) 11/07/2019 10:11 AM   PTH Comment 11/07/2019 10:11 AM   Patient has failed these meds in past: n/a Patient is currently controlled on the following medications:  . Calcitriol 0.25 mcg daily  We discussed:  Most recent PTH is at goal; pt endorses compliance and denies issues  Plan  Continue current  medications  Anemia   CBC Latest Ref Rng & Units 12/03/2019 12/02/2019 12/01/2019  WBC 4.0 - 10.5 K/uL 8.9 10.2 10.0  Hemoglobin 12.0 - 15.0 g/dL 7.8(L) 7.6(L) 7.5(L)  Hematocrit 36.0 - 46.0 % 24.2(L) 23.6(L) 22.2(L)  Platelets 150 - 400 K/uL 209 179 147(L)   Iron/TIBC/Ferritin/ %Sat    Component Value Date/Time   IRON 95 10/10/2019 0908   TIBC 269 10/10/2019 0908   FERRITIN 135 10/10/2019 0908   IRONPCTSAT 35 (H) 10/10/2019 0908   Patient has failed these meds in past: n/a Patient is currently controlled on the following medications:  . Retacrit 40,000 units q28 days   We discussed:  Pt missed her last dose of Retacrit due to hospitalization/SNF and has not gotten around to rescheduling yet; she has f/u with nephrologist tomorrow  Plan  Continue current medications  GERD   Patient has failed these meds in past: n/a Patient is currently controlled on the following medications:  . Pantoprazole 40 mg daily  We discussed:  Patient is satisfied with current regimen and denies issues  Plan  Continue current medications   Vaccines   Reviewed and discussed patient's vaccination history.    Immunization History  Administered Date(s) Administered  . Fluad Quad(high Dose 65+) 12/16/2019  . Influenza Whole 10/25/2011  . Influenza, High Dose Seasonal PF 01/11/2016, 12/05/2016, 12/15/2017  . Influenza,inj,Quad PF,6+ Mos 12/13/2012, 10/07/2014  . PFIZER SARS-COV-2 Vaccination 03/21/2019, 04/18/2019  . Pneumococcal Conjugate-13 08/28/2015  . Pneumococcal Polysaccharide-23 12/13/2011, 07/07/2017  . Td 09/09/2008  . Tdap 12/16/2019  . Zoster 12/13/2012   Pt reports she received her covid booster yesterday (01/05/2020). Chart updated.  Plan  Recommended patient receive Shingrix vaccine  Medication Management   Patient's preferred pharmacy is:  [id number] - [place], [place] [address] [place] [address] Phone: [phone] Fax: [phone]  [place], [place], [address] [place], [place] [address] [place] [address] Phone: [phone] Fax: [phone]  Uses pill box? No - she stores once daily meds sideway and TID meds vertically Pt endorses 100% compliance  We discussed: Current pharmacy is preferred with insurance plan and patient is satisfied with  pharmacy services  Plan  Continue current medication management strategy    Follow up: 3 month phone visit  Charlene Brooke, PharmD, [facility] Clinical Pharmacist [facility] [phone]

## 2020-01-07 DIAGNOSIS — N184 Chronic kidney disease, stage 4 (severe): Secondary | ICD-10-CM | POA: Diagnosis not present

## 2020-01-07 DIAGNOSIS — D631 Anemia in chronic kidney disease: Secondary | ICD-10-CM | POA: Diagnosis not present

## 2020-01-07 DIAGNOSIS — E1122 Type 2 diabetes mellitus with diabetic chronic kidney disease: Secondary | ICD-10-CM | POA: Diagnosis not present

## 2020-01-07 DIAGNOSIS — N189 Chronic kidney disease, unspecified: Secondary | ICD-10-CM | POA: Diagnosis not present

## 2020-01-07 DIAGNOSIS — I129 Hypertensive chronic kidney disease with stage 1 through stage 4 chronic kidney disease, or unspecified chronic kidney disease: Secondary | ICD-10-CM | POA: Diagnosis not present

## 2020-01-07 DIAGNOSIS — R809 Proteinuria, unspecified: Secondary | ICD-10-CM | POA: Diagnosis not present

## 2020-01-08 DIAGNOSIS — E039 Hypothyroidism, unspecified: Secondary | ICD-10-CM | POA: Diagnosis not present

## 2020-01-08 DIAGNOSIS — E785 Hyperlipidemia, unspecified: Secondary | ICD-10-CM | POA: Diagnosis not present

## 2020-01-08 DIAGNOSIS — E1165 Type 2 diabetes mellitus with hyperglycemia: Secondary | ICD-10-CM | POA: Diagnosis not present

## 2020-01-08 DIAGNOSIS — M25462 Effusion, left knee: Secondary | ICD-10-CM | POA: Diagnosis not present

## 2020-01-08 DIAGNOSIS — E1122 Type 2 diabetes mellitus with diabetic chronic kidney disease: Secondary | ICD-10-CM | POA: Diagnosis not present

## 2020-01-08 DIAGNOSIS — I129 Hypertensive chronic kidney disease with stage 1 through stage 4 chronic kidney disease, or unspecified chronic kidney disease: Secondary | ICD-10-CM | POA: Diagnosis not present

## 2020-01-08 DIAGNOSIS — N179 Acute kidney failure, unspecified: Secondary | ICD-10-CM | POA: Diagnosis not present

## 2020-01-08 DIAGNOSIS — K219 Gastro-esophageal reflux disease without esophagitis: Secondary | ICD-10-CM | POA: Diagnosis not present

## 2020-01-08 DIAGNOSIS — D696 Thrombocytopenia, unspecified: Secondary | ICD-10-CM | POA: Diagnosis not present

## 2020-01-08 DIAGNOSIS — M1712 Unilateral primary osteoarthritis, left knee: Secondary | ICD-10-CM | POA: Diagnosis not present

## 2020-01-08 DIAGNOSIS — K5790 Diverticulosis of intestine, part unspecified, without perforation or abscess without bleeding: Secondary | ICD-10-CM | POA: Diagnosis not present

## 2020-01-08 DIAGNOSIS — N184 Chronic kidney disease, stage 4 (severe): Secondary | ICD-10-CM | POA: Diagnosis not present

## 2020-01-09 ENCOUNTER — Telehealth: Payer: Self-pay | Admitting: Endocrinology

## 2020-01-09 ENCOUNTER — Encounter (HOSPITAL_COMMUNITY): Payer: Self-pay

## 2020-01-09 ENCOUNTER — Emergency Department (HOSPITAL_COMMUNITY)
Admission: EM | Admit: 2020-01-09 | Discharge: 2020-01-09 | Disposition: A | Discharge status: Home / Self Care | Payer: Medicare Other | Attending: Emergency Medicine | Admitting: Emergency Medicine

## 2020-01-09 DIAGNOSIS — E1122 Type 2 diabetes mellitus with diabetic chronic kidney disease: Secondary | ICD-10-CM | POA: Diagnosis not present

## 2020-01-09 DIAGNOSIS — Z79899 Other long term (current) drug therapy: Secondary | ICD-10-CM | POA: Insufficient documentation

## 2020-01-09 DIAGNOSIS — N184 Chronic kidney disease, stage 4 (severe): Secondary | ICD-10-CM | POA: Diagnosis not present

## 2020-01-09 DIAGNOSIS — E785 Hyperlipidemia, unspecified: Secondary | ICD-10-CM | POA: Insufficient documentation

## 2020-01-09 DIAGNOSIS — K5790 Diverticulosis of intestine, part unspecified, without perforation or abscess without bleeding: Secondary | ICD-10-CM | POA: Diagnosis not present

## 2020-01-09 DIAGNOSIS — E11649 Type 2 diabetes mellitus with hypoglycemia without coma: Secondary | ICD-10-CM | POA: Insufficient documentation

## 2020-01-09 DIAGNOSIS — E113599 Type 2 diabetes mellitus with proliferative diabetic retinopathy without macular edema, unspecified eye: Secondary | ICD-10-CM | POA: Diagnosis not present

## 2020-01-09 DIAGNOSIS — E162 Hypoglycemia, unspecified: Secondary | ICD-10-CM

## 2020-01-09 DIAGNOSIS — Z87891 Personal history of nicotine dependence: Secondary | ICD-10-CM | POA: Insufficient documentation

## 2020-01-09 DIAGNOSIS — R29810 Facial weakness: Secondary | ICD-10-CM | POA: Diagnosis not present

## 2020-01-09 DIAGNOSIS — I10 Essential (primary) hypertension: Secondary | ICD-10-CM | POA: Diagnosis not present

## 2020-01-09 DIAGNOSIS — E161 Other hypoglycemia: Secondary | ICD-10-CM | POA: Diagnosis not present

## 2020-01-09 DIAGNOSIS — R6889 Other general symptoms and signs: Secondary | ICD-10-CM | POA: Diagnosis not present

## 2020-01-09 DIAGNOSIS — M1712 Unilateral primary osteoarthritis, left knee: Secondary | ICD-10-CM | POA: Diagnosis not present

## 2020-01-09 DIAGNOSIS — N179 Acute kidney failure, unspecified: Secondary | ICD-10-CM | POA: Diagnosis not present

## 2020-01-09 DIAGNOSIS — Z7984 Long term (current) use of oral hypoglycemic drugs: Secondary | ICD-10-CM | POA: Insufficient documentation

## 2020-01-09 DIAGNOSIS — E1165 Type 2 diabetes mellitus with hyperglycemia: Secondary | ICD-10-CM | POA: Diagnosis not present

## 2020-01-09 DIAGNOSIS — Z794 Long term (current) use of insulin: Secondary | ICD-10-CM | POA: Diagnosis not present

## 2020-01-09 DIAGNOSIS — D696 Thrombocytopenia, unspecified: Secondary | ICD-10-CM | POA: Diagnosis not present

## 2020-01-09 DIAGNOSIS — K219 Gastro-esophageal reflux disease without esophagitis: Secondary | ICD-10-CM | POA: Diagnosis not present

## 2020-01-09 DIAGNOSIS — M25462 Effusion, left knee: Secondary | ICD-10-CM | POA: Diagnosis not present

## 2020-01-09 DIAGNOSIS — E039 Hypothyroidism, unspecified: Secondary | ICD-10-CM | POA: Diagnosis not present

## 2020-01-09 DIAGNOSIS — Z743 Need for continuous supervision: Secondary | ICD-10-CM | POA: Diagnosis not present

## 2020-01-09 DIAGNOSIS — E1169 Type 2 diabetes mellitus with other specified complication: Secondary | ICD-10-CM | POA: Insufficient documentation

## 2020-01-09 DIAGNOSIS — I129 Hypertensive chronic kidney disease with stage 1 through stage 4 chronic kidney disease, or unspecified chronic kidney disease: Secondary | ICD-10-CM | POA: Diagnosis not present

## 2020-01-09 LAB — CBC WITH DIFFERENTIAL/PLATELET
Abs Immature Granulocytes: 0.03 10*3/uL (ref 0.00–0.07)
Basophils Absolute: 0 10*3/uL (ref 0.0–0.1)
Basophils Relative: 1 %
Eosinophils Absolute: 0.1 10*3/uL (ref 0.0–0.5)
Eosinophils Relative: 2 %
HCT: 26.9 % — ABNORMAL LOW (ref 36.0–46.0)
Hemoglobin: 8.4 g/dL — ABNORMAL LOW (ref 12.0–15.0)
Immature Granulocytes: 0 %
Lymphocytes Relative: 15 %
Lymphs Abs: 1.1 10*3/uL (ref 0.7–4.0)
MCH: 31.5 pg (ref 26.0–34.0)
MCHC: 31.2 g/dL (ref 30.0–36.0)
MCV: 100.7 fL — ABNORMAL HIGH (ref 80.0–100.0)
Monocytes Absolute: 0.8 10*3/uL (ref 0.1–1.0)
Monocytes Relative: 11 %
Neutro Abs: 4.9 10*3/uL (ref 1.7–7.7)
Neutrophils Relative %: 71 %
Platelets: 178 10*3/uL (ref 150–400)
RBC: 2.67 MIL/uL — ABNORMAL LOW (ref 3.87–5.11)
RDW: 14.7 % (ref 11.5–15.5)
WBC: 6.9 10*3/uL (ref 4.0–10.5)
nRBC: 0 % (ref 0.0–0.2)

## 2020-01-09 LAB — CBG MONITORING, ED
Glucose-Capillary: 57 mg/dL — ABNORMAL LOW (ref 70–99)
Glucose-Capillary: 59 mg/dL — ABNORMAL LOW (ref 70–99)
Glucose-Capillary: 52 mg/dL — ABNORMAL LOW (ref 70–99)
Glucose-Capillary: 127 mg/dL — ABNORMAL HIGH (ref 70–99)
Glucose-Capillary: 55 mg/dL — ABNORMAL LOW (ref 70–99)
Glucose-Capillary: 142 mg/dL — ABNORMAL HIGH (ref 70–99)
Glucose-Capillary: 125 mg/dL — ABNORMAL HIGH (ref 70–99)

## 2020-01-09 LAB — BASIC METABOLIC PANEL
Anion gap: 9 (ref 5–15)
BUN: 57 mg/dL — ABNORMAL HIGH (ref 8–23)
CO2: 24 mmol/L (ref 22–32)
Calcium: 8.5 mg/dL — ABNORMAL LOW (ref 8.9–10.3)
Chloride: 104 mmol/L (ref 98–111)
Creatinine, Ser: 2.89 mg/dL — ABNORMAL HIGH (ref 0.44–1.00)
GFR, Estimated: 17 mL/min — ABNORMAL LOW (ref >60)
Glucose, Bld: 55 mg/dL — ABNORMAL LOW (ref 70–99)
Potassium: 4.2 mmol/L (ref 3.5–5.1)
Sodium: 137 mmol/L (ref 135–145)

## 2020-01-09 MED ORDER — DEXTROSE 50 % IV SOLN
INTRAVENOUS | Status: AC
  Administered 2020-01-09: 17:00:00 50 mL
  Filled 2020-01-09: qty 50

## 2020-01-09 MED ORDER — GLUCOSE 40 % PO GEL
1.0000 | Freq: Once | ORAL | Status: AC
  Administered 2020-01-09: 16:00:00 37.5 g via ORAL
  Filled 2020-01-09: qty 1

## 2020-01-09 MED ORDER — GLUCOSE 40 % PO GEL
ORAL | Status: AC
  Administered 2020-01-09: 15:00:00 37.5 g via ORAL
  Filled 2020-01-09: qty 1

## 2020-01-09 MED ORDER — GLUCOSE 40 % PO GEL: 1.0000 | Freq: Once | ORAL | Status: AC

## 2020-01-09 NOTE — Telephone Encounter (Signed)
Patient states her blood sugar has been as low as 35 this morning - she states that she has not felt right, she has not taken the medication today, and is checking her blood sugar right now while on the phone with me- it was 55 she just ate some lunch and it should be trending up. I tried to explain to patient to monitor her blood sugar and to call 911 if her blood sugar dropped that low again, and patient hung up the phone.patient will call us back with her blood sugars.   Will route to MD to make aware.

## 2020-01-09 NOTE — Discharge Instructions (Addendum)
Stop taking the repaglinide.  Make sure that you are eating 3 meals a day, using a low carbohydrate diet.  Call your endocrinologist, Dr. Loanne Drilling, if you continue to have periods of low blood sugar.

## 2020-01-09 NOTE — Telephone Encounter (Signed)
Patient called stating the medication Dr Loanne Drilling prescribed (acarbose) has made her blood pressure become low. Please advise. [phone]

## 2020-01-09 NOTE — ED Provider Notes (Signed)
[facility] DEPT Provider Note   CSN: [id number] Arrival date & time: 01/09/20  1444     History Chief Complaint  Patient presents with  . Hypoglycemia    [name] is a 69 y.o. female.  HPI She presents for evaluation of low blood sugar.  She checked it this morning and it was "30."  She ate some food and started feeling better.  Later she decided to call EMS who found her with a CBG of 55, after which she ate some food and drank a soda and CBG improved to 72.  She was transferred here alert and cooperative.  She states that her cardiologist recently put her on another medication for diabetes, acarbose  This was because of elevated hemoglobin A1c.  She does not use insulin.  She states that earlier today she ate just a little bit of oatmeal and lately has not been eating as much as usual.  She denies fever, chills, cough, shortness of breath, nausea, vomiting or diarrhea.  She is taking her usual medications as prescribed.  There are no other known modifying factors.    Past Medical History:  Diagnosis Date  . Acute kidney injury ([place]) 01/08/2015  . Arthritis   . Chronic back pain   . Constipation   . Cough   . Diabetes mellitus, type 2 ([place])   . Diverticulosis   . Fibroid    patient thinks this was the reason for her hysterectomy  . GERD (gastroesophageal reflux disease)   . Heart murmur   . History of sebaceous cyst   . Hyperlipemia   . Hyperplastic colon polyp   . Hypertension   . Hyperthyroidism   . Lumbar radiculopathy   . Shortness of breath 09/13/2013  . Stroke ([place]) 2015?   x4   . Tobacco abuse   . 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 Right flank discomfort 11/28/2019  . Vitamin D toxicity, accidental or unintentional, initial encounter 09/09/2017  . Ataxia 09/09/2017  . Type 2 diabetes mellitus with hyperglycemia ([place]) 09/09/2017   . Chronic kidney disease (CKD)   . Olecranon bursitis of right elbow 03/09/2017  . Thyroid nodule 06/29/2016  . 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 Vitamin D deficiency 01/12/2016  . Medicare annual wellness visit, initial 01/11/2016  . Lacunar infarct, acute ([place]) 03/23/2015  . Non compliance w medication regimen 01/16/2015  . Anemia 01/08/2015  . Stroke ([place]) 01/08/2015  . Hyperlipemia   . Carotid bruit present 12/13/2012  . Routine health maintenance 10/24/2010  . PERIPHERAL EDEMA 09/10/2009  . ELECTROCARDIOGRAM, ABNORMAL 04/11/2008  . HEART MURMUR, SYSTOLIC 93/23/5573  . SEBACEOUS CYST, NECK 12/13/2006  . Hyperthyroidism 09/27/2006  . Secondary diabetes with peripheral vascular disease ([place]) 09/27/2006  . Hyperlipidemia LDL goal <70 09/27/2006  . TOBACCO ABUSE 09/27/2006  . Essential hypertension 09/27/2006    Past Surgical History:  Procedure Laterality Date  . ABDOMINAL HYSTERECTOMY     --?ovaries remain  . COLONOSCOPY W/ BIOPSIES  09/11/2008  . RETINOPATHY SURGERY Bilateral 2013   Dr. Zigmund Daniel     OB History    Gravida  1   Para  1   Term      Preterm      AB      Living  1  SAB      IAB      Ectopic      Multiple      Live Births              Family History  Problem Relation Age of Onset  . Hypertension Mother   . Sleep apnea Mother   . Diabetes Mother   . Hyperlipidemia Mother   . Lung cancer Father        smoker  . Hypertension Sister   . Diabetes Sister   . Hypertension Brother   . Hypertension Sister   . Diabetes Brother   . Hypertension Brother   . Breast cancer Neg Hx   . Colon cancer Neg Hx   . Esophageal cancer Neg Hx   . Pancreatic cancer Neg Hx   . Liver disease Neg Hx   . Colon polyps Neg Hx     Social History   Tobacco Use  . Smoking status: Former Smoker    Packs/day: 0.50     Years: 35.00    Pack years: 17.50    Types: Cigarettes    Quit date: 07/07/2014    Years since quitting: 5.5  . Smokeless tobacco: Never Used  Vaping Use  . Vaping Use: Never used  Substance Use Topics  . Alcohol use: No  . Drug use: No    Home Medications Prior to Admission medications   Medication Sig Start Date End Date Taking? Authorizing Provider  acarbose (PRECOSE) 25 MG tablet Take 0.5 tablets (12.5 mg total) by mouth 3 (three) times daily with meals. 12/25/19   Romero Belling, MD  amLODipine (NORVASC) 10 MG tablet Take 1 tablet (10 mg total) by mouth daily. 12/16/19   Olive Bass, FNP  atorvastatin (LIPITOR) 80 MG tablet TAKE 1 TABLET BY MOUTH EVERY DAY Patient taking differently: Take 80 mg by mouth daily. 06/12/19   Olive Bass, FNP  Blood Glucose Monitoring Suppl (ONETOUCH VERIO FLEX SYSTEM) w/Device KIT 1 each by Does not apply route 2 (two) times daily. E11.9    [provider]  bromocriptine (PARLODEL) 2.5 MG tablet TAKE 1/2 TABLET BY MOUTH EVERY DAY Patient taking differently: Take 2.5 mg by mouth daily. 11/14/19   Romero Belling, MD  calcitRIOL (ROCALTROL) 0.25 MCG capsule Take 0.25 mcg by mouth daily.    [provider]  cloNIDine (CATAPRES) 0.2 MG tablet Take 1 tablet (0.2 mg total) by mouth 3 (three) times daily. 05/21/19   Olive Bass, FNP  epoetin alfa-epbx (RETACRIT) 28942 UNIT/ML injection Inject 40,000 Units into the skin every 28 (twenty-eight) days.    [provider]  furosemide (LASIX) 40 MG tablet Take 40 mg by mouth 3 (three) times a week.    [provider]  glucose blood (ONETOUCH VERIO) test strip USE AS DIRECTED TWICE DAILY 12/12/19   Olive Bass, FNP  metoprolol succinate (TOPROL-XL) 100 MG 24 hr tablet Take 100 mg by mouth daily. Take with or immediately following a meal.    [provider]  minoxidil (LONITEN) 2.5 MG tablet Take 2.5 mg by mouth 3 (three) times daily.     [provider]  OneTouch Delica Lancets 33G MISC 1 each by Does not apply route 2 (two) times daily. E11.9    [provider]  pantoprazole (PROTONIX) 40 MG tablet Take 1 tablet (40 mg total) by mouth daily. 06/12/19   Olive Bass, FNP  polyethylene glycol (MIRALAX / GLYCOLAX) 17 g packet Take 34-51  g by mouth daily as needed for moderate constipation.    [provider]  repaglinide (PRANDIN) 2 MG tablet TAKE 1 TABLET (2 MG TOTAL) BY MOUTH 3 (THREE) TIMES DAILY BEFORE MEALS. 02/22/19   Renato Shin, MD    Allergies    Patient has no known allergies.  Review of Systems   Review of Systems  All other systems reviewed and are negative.   Physical Exam Updated Vital Signs BP (!) 142/62   Pulse (!) 58   Temp 97.8 F (36.6 C)   Resp 12   SpO2 97%   Physical Exam Vitals and nursing note reviewed.  Constitutional:      General: She is not in acute distress.    Appearance: She is well-developed and well-nourished. She is not ill-appearing, toxic-appearing or diaphoretic.  HENT:     Head: Normocephalic and atraumatic.     Right Ear: External ear normal.     Left Ear: External ear normal.     Nose: No congestion or rhinorrhea.     Mouth/Throat:     Pharynx: No oropharyngeal exudate or posterior oropharyngeal erythema.  Eyes:     Extraocular Movements: EOM normal.     Conjunctiva/sclera: Conjunctivae normal.     Pupils: Pupils are equal, round, and reactive to light.  Neck:     Trachea: Phonation normal.  Cardiovascular:     Rate and Rhythm: Normal rate and regular rhythm.  Pulmonary:     Effort: Pulmonary effort is normal.     Breath sounds: Normal breath sounds.  Chest:     Chest wall: No tenderness.  Abdominal:     General: There is no distension.     Palpations: Abdomen is soft.     Tenderness: There is no abdominal tenderness. There is no guarding.  Musculoskeletal:        General: Normal range of motion.     Cervical back: Normal  range of motion and neck supple.  Skin:    General: Skin is warm and dry.  Neurological:     Mental Status: She is alert and oriented to person, place, and time.     Motor: No abnormal muscle tone.     Comments: No dysarthria or aphasia.  Psychiatric:        Mood and Affect: Mood and affect and mood normal.        Behavior: Behavior normal.        Thought Content: Thought content normal.        Judgment: Judgment normal.     ED Results / Procedures / Treatments   Labs (all labs ordered are listed, but only abnormal results are displayed) Labs Reviewed  CBC WITH DIFFERENTIAL/PLATELET - Abnormal; Notable for the following components:      Result Value   RBC 2.67 (*)    Hemoglobin 8.4 (*)    HCT 26.9 (*)    MCV 100.7 (*)    All other components within normal limits  BASIC METABOLIC PANEL - Abnormal; Notable for the following components:   Glucose, Bld 55 (*)    BUN 57 (*)    Creatinine, Ser 2.89 (*)    Calcium 8.5 (*)    GFR, Estimated 17 (*)    All other components within normal limits  CBG MONITORING, ED - Abnormal; Notable for the following components:   Glucose-Capillary 59 (*)    All other components within normal limits  CBG MONITORING, ED - Abnormal; Notable for the following components:  Glucose-Capillary 57 (*)    All other components within normal limits  CBG MONITORING, ED - Abnormal; Notable for the following components:   Glucose-Capillary 52 (*)    All other components within normal limits  CBG MONITORING, ED - Abnormal; Notable for the following components:   Glucose-Capillary 55 (*)    All other components within normal limits  CBG MONITORING, ED - Abnormal; Notable for the following components:   Glucose-Capillary 127 (*)    All other components within normal limits  CBG MONITORING, ED - Abnormal; Notable for the following components:   Glucose-Capillary 142 (*)    All other components within normal limits  CBG MONITORING, ED - Abnormal; Notable for  the following components:   Glucose-Capillary 125 (*)    All other components within normal limits    EKG None  Radiology No results found.  Procedures Procedures (including critical care time)  Medications Ordered in ED Medications  dextrose (GLUTOSE) 40 % oral gel 37.5 g (37.5 g Oral Given 01/09/20 1507)  dextrose (GLUTOSE) 40 % oral gel 37.5 g (37.5 g Oral Given 01/09/20 1612)  dextrose 50 % solution (50 mLs  Given 01/09/20 1640)    ED Course  I have reviewed the triage vital signs and the nursing notes.  Pertinent labs & imaging results that were available during my care of the patient were reviewed by me and considered in my medical decision making (see chart for details).  Clinical Course as of 01/10/20 1014  Thu Jan 09, 2020  1905 Patient contacted her endocrinologist, Dr. Loanne Drilling, prior to coming here.  His response was "D/c repaglinide." [EW]    Clinical Course User Index [EW] Daleen Bo, MD   MDM Rules/Calculators/A&P                           Patient Vitals for the past 24 hrs:  BP Temp Temp src Pulse Resp SpO2  01/09/20 1900 (!) 142/62 97.8 F (36.6 C) -- (!) 58 12 97 %  01/09/20 1815 (!) 128/55 -- -- (!) 54 16 96 %  01/09/20 1745 (!) 158/60 -- -- -- (!) 22 97 %  01/09/20 1716 (!) 178/68 -- -- (!) 56 (!) 21 97 %  01/09/20 1633 (!) 167/66 -- -- (!) 55 19 97 %  01/09/20 1452 (!) 143/59 97.8 F (36.6 C) Oral (!) 55 16 97 %    At the time of discharge- reevaluation with update and discussion. After initial assessment and treatment, an updated evaluation reveals she is comfortable and tolerating food and fluids.  CBG improved.-As discussed and questions answered.  Patient sister in the room at this time. Daleen Bo   Medical Decision Making:  This patient is presenting for evaluation of low blood sugar, which does require a range of treatment options, and is a complaint that involves a high risk of morbidity and mortality. The differential diagnoses  include acute infection, metabolic disorder, medication intolerance. I decided to review old records, and in summary elderly female recently started a new blood sugar medication.  Presenting with hypoglycemia.  I did not require additional historical information from anyone.  Clinical Laboratory Tests Ordered, included CBC and Metabolic panel. Review indicates hemoglobin low, MCV high, glucose low, BUN high, creatinine high, calcium low, GFR low.   Critical Interventions-clinical evaluation, laboratory testing, IV fluids, IV glucose, observation, oral nutrition given, reassessment  After These Interventions, the Patient was reevaluated and was found blood sugar stabilized.  Patient intolerant of new medication, repaglinide.  Her endocrinologist is aware that she needs to start taking this.  No evidence for acute infectious process or serious metabolic disorder.  Creatinine elevated but better than recent baseline.  Patient stable for discharge.  CRITICAL CARE-no Performed by: Daleen Bo  Nursing Notes Reviewed/ Care Coordinated Applicable Imaging Reviewed Interpretation of Laboratory Data incorporated into ED treatment  The patient appears reasonably screened and/or stabilized for discharge and I doubt any other medical condition or other [facility] requiring further screening, evaluation, or treatment in the ED at this time prior to discharge.  Plan: Home Medications-stop repaglinide, continue others; Home Treatments-usual diet and plenty of fluids; return here if the recommended treatment, does not improve the symptoms; Recommended follow up-endocrinology follow-up 1 to 2 weeks.     Final Clinical Impression(s) / ED Diagnoses Final diagnoses:  Hypoglycemia    Rx / DC Orders ED Discharge Orders    None      After discharge, the patient reported to nurse that she was short of breath. Patient was ambulated in the room and had normal oxygen saturation.   Daleen Bo, MD 01/10/20  1014

## 2020-01-09 NOTE — ED Triage Notes (Signed)
Pt arrived via EMS, started takingh new medication last night (acarbose), low CBG this morning when she woke CBG 35, on EMS arrival  CBG 55, ate sandwich and drank 12oz soda.  Last 72 CBG PTA   59 CBG in triage.

## 2020-01-09 NOTE — Telephone Encounter (Signed)
D/c repaglinide Please call or message [place] next week, to tell us how the blood sugar is doing. If BP is also low, please see primary care provider, for that

## 2020-01-09 NOTE — Telephone Encounter (Signed)
Called patient, advised of message below. Patient verbalized understanding, she was currently at the ED, wanted to make you aware.  Thanks !

## 2020-01-10 ENCOUNTER — Encounter: Payer: Self-pay | Admitting: Endocrinology

## 2020-01-10 ENCOUNTER — Other Ambulatory Visit: Payer: Self-pay

## 2020-01-10 ENCOUNTER — Ambulatory Visit: Payer: Medicare Other | Admitting: Endocrinology

## 2020-01-10 VITALS — BP 160/72 | HR 64 | Wt 141.0 lb

## 2020-01-10 DIAGNOSIS — E113599 Type 2 diabetes mellitus with proliferative diabetic retinopathy without macular edema, unspecified eye: Secondary | ICD-10-CM | POA: Diagnosis not present

## 2020-01-10 MED ORDER — REPAGLINIDE 2 MG PO TABS: 2.0000 mg | ORAL_TABLET | Freq: Two times a day (BID) | ORAL | 3 refills | Status: DC

## 2020-01-10 NOTE — Patient Instructions (Addendum)
I have sent a prescription to your pharmacy, to reduce the repaglinide to twice a day (with meals).  Please continue the same other 2 diabetes medications. Here is a new meter.  Please call if the blood sugar goes below 70 again.    Please follow up with your kidney doctor soon, for the blood pressure.   Please come back for a follow-up appointment in 1 month.  Please bring your medication bottles.

## 2020-01-10 NOTE — Progress Notes (Signed)
Subjective:    Patient ID: [name], female    DOB: 11-18-1950, 69 y.o.   MRN: [id number]  HPI Pt returns for f/u of diabetes mellitus:  DM type: 2, but she may be developing type 1 Dx'ed: 9381 Complications: polyneuropathy, stage 5 CRI, CVA's, and PDR.   Therapy: 3 oral meds GDM: never DKA: never Severe hypoglycemia: never.   Pancreatitis: never Pancreatic imaging: never Other: she cannot afford brand-name meds; she has never been on insulin, but she has taken trulicity; edema limits rx options.   Interval history: Pt was seen in ER yesterday for hypoglycemia.  She is unable to cite precip factor.  pt states she feels better in general, since then.  Pt says cbg was 53 again this am.  She has home PT, who checks cbg.  They say it has been normal.   Past Medical History:  Diagnosis Date  . Acute kidney injury ([place]) 01/08/2015  . Arthritis   . Chronic back pain   . Constipation   . Cough   . Diabetes mellitus, type 2 ([place])   . Diverticulosis   . Fibroid    patient thinks this was the reason for her hysterectomy  . GERD (gastroesophageal reflux disease)   . Heart murmur   . History of sebaceous cyst   . Hyperlipemia   . Hyperplastic colon polyp   . Hypertension   . Hyperthyroidism   . Lumbar radiculopathy   . Shortness of breath 09/13/2013  . Stroke ([place]) 2015?   x4   . Tobacco abuse   . 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 ABDOMINAL HYSTERECTOMY     --?ovaries remain  . COLONOSCOPY W/ BIOPSIES  09/11/2008  . RETINOPATHY SURGERY Bilateral 2013   Dr. Zigmund Daniel    Social History   Socioeconomic History  . Marital status: Divorced    Spouse name: Not on file  . Number of children: 1  . Years of education: 60  . Highest education level: Not on file  Occupational History  . Occupation: Insurance account manager: [employer]    Comment: Retired  Tobacco Use   . Smoking status: Former Smoker    Packs/day: 0.50    Years: 35.00    Pack years: 17.50    Types: Cigarettes    Quit date: 07/07/2014    Years since quitting: 5.5  . Smokeless tobacco: Never Used  Vaping Use  . Vaping Use: Never used  Substance and Sexual Activity  . Alcohol use: No  . Drug use: No  . Sexual activity: Never    Partners: Male    Birth control/protection: Surgical    Comment: TAH--?ovaries remain  Other Topics Concern  . Not on file  Social History Narrative   Divorced 1 son    Retired Museum/gallery curator for [employer]   Former smoker no alcohol caffeine or drug use report denies abuse and feels safe at home.   Social Determinants of Health   Financial Resource Strain: Medium Risk  . Difficulty of Paying Living Expenses: Somewhat hard  Food Insecurity: Food Insecurity Present  . Worried About Charity fundraiser in the Last Year: Often true  . Ran Out of Food in the Last Year: Sometimes true  Transportation Needs: No Transportation Needs  . Lack of Transportation (Medical): No  . Lack of Transportation (Non-Medical): No  Physical Activity: Sufficiently  Active  . Days of Exercise per Week: 5 days  . Minutes of Exercise per Session: 30 min  Stress: No Stress Concern Present  . Feeling of Stress : Not at all  Social Connections: Not on file  Intimate Partner Violence: Not At Risk  . Fear of Current or Ex-Partner: No  . Emotionally Abused: No  . Physically Abused: No  . Sexually Abused: No    Current Outpatient Medications on File Prior to Visit  Medication Sig Dispense Refill  . acarbose (PRECOSE) 25 MG tablet Take 0.5 tablets (12.5 mg total) by mouth 3 (three) times daily with meals. 15 tablet 5  . amLODipine (NORVASC) 10 MG tablet Take 1 tablet (10 mg total) by mouth daily. 90 tablet 0  . atorvastatin (LIPITOR) 80 MG tablet TAKE 1 TABLET BY MOUTH EVERY DAY (Patient taking differently: Take 80 mg by mouth daily.) 90 tablet 3  . Blood Glucose  Monitoring Suppl (ONETOUCH VERIO FLEX SYSTEM) w/Device KIT 1 each by Does not apply route 2 (two) times daily. E11.9    . bromocriptine (PARLODEL) 2.5 MG tablet TAKE 1/2 TABLET BY MOUTH EVERY DAY (Patient taking differently: Take 2.5 mg by mouth daily.) 45 tablet 1  . calcitRIOL (ROCALTROL) 0.25 MCG capsule Take 0.25 mcg by mouth daily.    . cloNIDine (CATAPRES) 0.2 MG tablet Take 1 tablet (0.2 mg total) by mouth 3 (three) times daily. 270 tablet 3  . epoetin alfa-epbx (RETACRIT) 29518 UNIT/ML injection Inject 40,000 Units into the skin every 28 (twenty-eight) days.    . furosemide (LASIX) 40 MG tablet Take 40 mg by mouth 3 (three) times a week.    [name] glucose blood (ONETOUCH VERIO) test strip USE AS DIRECTED TWICE DAILY 200 strip 3  . metoprolol succinate (TOPROL-XL) 100 MG 24 hr tablet Take 100 mg by mouth daily. Take with or immediately following a meal.    . minoxidil (LONITEN) 2.5 MG tablet Take 2.5 mg by mouth 3 (three) times daily.    [name] Delica Lancets 84Z MISC 1 each by Does not apply route 2 (two) times daily. E11.9    . pantoprazole (PROTONIX) 40 MG tablet Take 1 tablet (40 mg total) by mouth daily. 90 tablet 3  . polyethylene glycol (MIRALAX / GLYCOLAX) 17 g packet Take 34-51 g by mouth daily as needed for moderate constipation.     No current facility-administered medications on file prior to visit.    No Known Allergies  Family History  Problem Relation Age of Onset  . Hypertension Mother   . Sleep apnea Mother   . Diabetes Mother   . Hyperlipidemia Mother   . Lung cancer Father        smoker  . Hypertension Sister   . Diabetes Sister   . Hypertension Brother   . Hypertension Sister   . Diabetes Brother   . Hypertension Brother   . Breast cancer Neg Hx   . Colon cancer Neg Hx   . Esophageal cancer Neg Hx   . Pancreatic cancer Neg Hx   . Liver disease Neg Hx   . Colon polyps Neg Hx     BP (!) 160/72   Pulse 64   Wt 141 lb (64 kg)   SpO2 94%   BMI 25.79  kg/m   Review of Systems     Objective:   Physical Exam VITAL SIGNS:  See vs page GENERAL: no distress GAIT: normal and steady.  Assessment & Plan:  Type 2 DM, with CRI Hypoglycemia, due to repaglinide.  As this is unusual, we should verify meds by seeing bottles.    Patient Instructions  I have sent a prescription to your pharmacy, to reduce the repaglinide to twice a day (with meals).  Please continue the same other 2 diabetes medications. Here is a new meter.  Please call if the blood sugar goes below 70 again.    Please follow up with your kidney doctor soon, for the blood pressure.   Please come back for a follow-up appointment in 1 month.  Please bring your medication bottles.

## 2020-01-13 DIAGNOSIS — E1165 Type 2 diabetes mellitus with hyperglycemia: Secondary | ICD-10-CM | POA: Diagnosis not present

## 2020-01-13 DIAGNOSIS — K5790 Diverticulosis of intestine, part unspecified, without perforation or abscess without bleeding: Secondary | ICD-10-CM | POA: Diagnosis not present

## 2020-01-13 DIAGNOSIS — E785 Hyperlipidemia, unspecified: Secondary | ICD-10-CM | POA: Diagnosis not present

## 2020-01-13 DIAGNOSIS — N179 Acute kidney failure, unspecified: Secondary | ICD-10-CM | POA: Diagnosis not present

## 2020-01-13 DIAGNOSIS — K219 Gastro-esophageal reflux disease without esophagitis: Secondary | ICD-10-CM | POA: Diagnosis not present

## 2020-01-13 DIAGNOSIS — M1712 Unilateral primary osteoarthritis, left knee: Secondary | ICD-10-CM | POA: Diagnosis not present

## 2020-01-13 DIAGNOSIS — N184 Chronic kidney disease, stage 4 (severe): Secondary | ICD-10-CM | POA: Diagnosis not present

## 2020-01-13 DIAGNOSIS — I129 Hypertensive chronic kidney disease with stage 1 through stage 4 chronic kidney disease, or unspecified chronic kidney disease: Secondary | ICD-10-CM | POA: Diagnosis not present

## 2020-01-13 DIAGNOSIS — E1122 Type 2 diabetes mellitus with diabetic chronic kidney disease: Secondary | ICD-10-CM | POA: Diagnosis not present

## 2020-01-13 DIAGNOSIS — M25462 Effusion, left knee: Secondary | ICD-10-CM | POA: Diagnosis not present

## 2020-01-13 DIAGNOSIS — E039 Hypothyroidism, unspecified: Secondary | ICD-10-CM | POA: Diagnosis not present

## 2020-01-13 DIAGNOSIS — D696 Thrombocytopenia, unspecified: Secondary | ICD-10-CM | POA: Diagnosis not present

## 2020-01-14 DIAGNOSIS — M1712 Unilateral primary osteoarthritis, left knee: Secondary | ICD-10-CM | POA: Diagnosis not present

## 2020-01-14 DIAGNOSIS — E1122 Type 2 diabetes mellitus with diabetic chronic kidney disease: Secondary | ICD-10-CM | POA: Diagnosis not present

## 2020-01-14 DIAGNOSIS — E785 Hyperlipidemia, unspecified: Secondary | ICD-10-CM | POA: Diagnosis not present

## 2020-01-14 DIAGNOSIS — I129 Hypertensive chronic kidney disease with stage 1 through stage 4 chronic kidney disease, or unspecified chronic kidney disease: Secondary | ICD-10-CM | POA: Diagnosis not present

## 2020-01-14 DIAGNOSIS — K5790 Diverticulosis of intestine, part unspecified, without perforation or abscess without bleeding: Secondary | ICD-10-CM | POA: Diagnosis not present

## 2020-01-14 DIAGNOSIS — M25462 Effusion, left knee: Secondary | ICD-10-CM | POA: Diagnosis not present

## 2020-01-14 DIAGNOSIS — E039 Hypothyroidism, unspecified: Secondary | ICD-10-CM | POA: Diagnosis not present

## 2020-01-14 DIAGNOSIS — D696 Thrombocytopenia, unspecified: Secondary | ICD-10-CM | POA: Diagnosis not present

## 2020-01-14 DIAGNOSIS — N179 Acute kidney failure, unspecified: Secondary | ICD-10-CM | POA: Diagnosis not present

## 2020-01-14 DIAGNOSIS — N184 Chronic kidney disease, stage 4 (severe): Secondary | ICD-10-CM | POA: Diagnosis not present

## 2020-01-14 DIAGNOSIS — E1165 Type 2 diabetes mellitus with hyperglycemia: Secondary | ICD-10-CM | POA: Diagnosis not present

## 2020-01-14 DIAGNOSIS — K219 Gastro-esophageal reflux disease without esophagitis: Secondary | ICD-10-CM | POA: Diagnosis not present

## 2020-01-15 DIAGNOSIS — E1122 Type 2 diabetes mellitus with diabetic chronic kidney disease: Secondary | ICD-10-CM | POA: Diagnosis not present

## 2020-01-15 DIAGNOSIS — N179 Acute kidney failure, unspecified: Secondary | ICD-10-CM | POA: Diagnosis not present

## 2020-01-15 DIAGNOSIS — E785 Hyperlipidemia, unspecified: Secondary | ICD-10-CM | POA: Diagnosis not present

## 2020-01-15 DIAGNOSIS — K219 Gastro-esophageal reflux disease without esophagitis: Secondary | ICD-10-CM | POA: Diagnosis not present

## 2020-01-15 DIAGNOSIS — D696 Thrombocytopenia, unspecified: Secondary | ICD-10-CM | POA: Diagnosis not present

## 2020-01-15 DIAGNOSIS — M25462 Effusion, left knee: Secondary | ICD-10-CM | POA: Diagnosis not present

## 2020-01-15 DIAGNOSIS — N184 Chronic kidney disease, stage 4 (severe): Secondary | ICD-10-CM | POA: Diagnosis not present

## 2020-01-15 DIAGNOSIS — I129 Hypertensive chronic kidney disease with stage 1 through stage 4 chronic kidney disease, or unspecified chronic kidney disease: Secondary | ICD-10-CM | POA: Diagnosis not present

## 2020-01-15 DIAGNOSIS — K5790 Diverticulosis of intestine, part unspecified, without perforation or abscess without bleeding: Secondary | ICD-10-CM | POA: Diagnosis not present

## 2020-01-15 DIAGNOSIS — M1712 Unilateral primary osteoarthritis, left knee: Secondary | ICD-10-CM | POA: Diagnosis not present

## 2020-01-15 DIAGNOSIS — E039 Hypothyroidism, unspecified: Secondary | ICD-10-CM | POA: Diagnosis not present

## 2020-01-15 DIAGNOSIS — E1165 Type 2 diabetes mellitus with hyperglycemia: Secondary | ICD-10-CM | POA: Diagnosis not present

## 2020-01-20 DIAGNOSIS — E039 Hypothyroidism, unspecified: Secondary | ICD-10-CM | POA: Diagnosis not present

## 2020-01-20 DIAGNOSIS — E785 Hyperlipidemia, unspecified: Secondary | ICD-10-CM | POA: Diagnosis not present

## 2020-01-20 DIAGNOSIS — I129 Hypertensive chronic kidney disease with stage 1 through stage 4 chronic kidney disease, or unspecified chronic kidney disease: Secondary | ICD-10-CM | POA: Diagnosis not present

## 2020-01-20 DIAGNOSIS — N184 Chronic kidney disease, stage 4 (severe): Secondary | ICD-10-CM | POA: Diagnosis not present

## 2020-01-20 DIAGNOSIS — N179 Acute kidney failure, unspecified: Secondary | ICD-10-CM | POA: Diagnosis not present

## 2020-01-20 DIAGNOSIS — K219 Gastro-esophageal reflux disease without esophagitis: Secondary | ICD-10-CM | POA: Diagnosis not present

## 2020-01-20 DIAGNOSIS — E1165 Type 2 diabetes mellitus with hyperglycemia: Secondary | ICD-10-CM | POA: Diagnosis not present

## 2020-01-20 DIAGNOSIS — M1712 Unilateral primary osteoarthritis, left knee: Secondary | ICD-10-CM | POA: Diagnosis not present

## 2020-01-20 DIAGNOSIS — M25462 Effusion, left knee: Secondary | ICD-10-CM | POA: Diagnosis not present

## 2020-01-20 DIAGNOSIS — E1122 Type 2 diabetes mellitus with diabetic chronic kidney disease: Secondary | ICD-10-CM | POA: Diagnosis not present

## 2020-01-20 DIAGNOSIS — K5790 Diverticulosis of intestine, part unspecified, without perforation or abscess without bleeding: Secondary | ICD-10-CM | POA: Diagnosis not present

## 2020-01-20 DIAGNOSIS — D696 Thrombocytopenia, unspecified: Secondary | ICD-10-CM | POA: Diagnosis not present

## 2020-01-22 DIAGNOSIS — I129 Hypertensive chronic kidney disease with stage 1 through stage 4 chronic kidney disease, or unspecified chronic kidney disease: Secondary | ICD-10-CM | POA: Diagnosis not present

## 2020-01-22 DIAGNOSIS — N184 Chronic kidney disease, stage 4 (severe): Secondary | ICD-10-CM | POA: Diagnosis not present

## 2020-01-22 DIAGNOSIS — K5790 Diverticulosis of intestine, part unspecified, without perforation or abscess without bleeding: Secondary | ICD-10-CM | POA: Diagnosis not present

## 2020-01-22 DIAGNOSIS — D696 Thrombocytopenia, unspecified: Secondary | ICD-10-CM | POA: Diagnosis not present

## 2020-01-22 DIAGNOSIS — M25462 Effusion, left knee: Secondary | ICD-10-CM | POA: Diagnosis not present

## 2020-01-22 DIAGNOSIS — E1122 Type 2 diabetes mellitus with diabetic chronic kidney disease: Secondary | ICD-10-CM | POA: Diagnosis not present

## 2020-01-22 DIAGNOSIS — E785 Hyperlipidemia, unspecified: Secondary | ICD-10-CM | POA: Diagnosis not present

## 2020-01-22 DIAGNOSIS — K219 Gastro-esophageal reflux disease without esophagitis: Secondary | ICD-10-CM | POA: Diagnosis not present

## 2020-01-22 DIAGNOSIS — M1712 Unilateral primary osteoarthritis, left knee: Secondary | ICD-10-CM | POA: Diagnosis not present

## 2020-01-22 DIAGNOSIS — E039 Hypothyroidism, unspecified: Secondary | ICD-10-CM | POA: Diagnosis not present

## 2020-01-22 DIAGNOSIS — E1165 Type 2 diabetes mellitus with hyperglycemia: Secondary | ICD-10-CM | POA: Diagnosis not present

## 2020-01-22 DIAGNOSIS — N179 Acute kidney failure, unspecified: Secondary | ICD-10-CM | POA: Diagnosis not present

## 2020-01-30 ENCOUNTER — Other Ambulatory Visit (HOSPITAL_COMMUNITY): Payer: Self-pay | Admitting: *Deleted

## 2020-01-31 ENCOUNTER — Other Ambulatory Visit: Payer: Self-pay

## 2020-01-31 ENCOUNTER — Encounter (HOSPITAL_COMMUNITY)
Admission: RE | Admit: 2020-01-31 | Discharge: 2020-01-31 | Disposition: A | Discharge status: Home / Self Care | Payer: Medicare Other | Source: Ambulatory Visit | Attending: Nephrology | Admitting: Nephrology

## 2020-01-31 VITALS — BP 139/56 | HR 51 | Temp 97.7°F | Resp 20

## 2020-01-31 DIAGNOSIS — N183 Chronic kidney disease, stage 3 unspecified: Secondary | ICD-10-CM | POA: Diagnosis not present

## 2020-01-31 LAB — RENAL FUNCTION PANEL
Albumin: 3 g/dL — ABNORMAL LOW (ref 3.5–5.0)
Anion gap: 10 (ref 5–15)
BUN: 61 mg/dL — ABNORMAL HIGH (ref 8–23)
CO2: 22 mmol/L (ref 22–32)
Calcium: 8.6 mg/dL — ABNORMAL LOW (ref 8.9–10.3)
Chloride: 102 mmol/L (ref 98–111)
Creatinine, Ser: 2.87 mg/dL — ABNORMAL HIGH (ref 0.44–1.00)
GFR, Estimated: 17 mL/min — ABNORMAL LOW (ref >60)
Glucose, Bld: 193 mg/dL — ABNORMAL HIGH (ref 70–99)
Phosphorus: 5 mg/dL — ABNORMAL HIGH (ref 2.5–4.6)
Potassium: 4.1 mmol/L (ref 3.5–5.1)
Sodium: 134 mmol/L — ABNORMAL LOW (ref 135–145)

## 2020-01-31 LAB — POCT HEMOGLOBIN-HEMACUE: Hemoglobin: 7.4 g/dL — ABNORMAL LOW (ref 12.0–15.0)

## 2020-01-31 MED ORDER — EPOETIN ALFA-EPBX 40000 UNIT/ML IJ SOLN: 40000.0000 [IU] | INTRAMUSCULAR | Status: DC

## 2020-01-31 MED ORDER — EPOETIN ALFA-EPBX 40000 UNIT/ML IJ SOLN
INTRAMUSCULAR | Status: AC
  Administered 2020-01-31: 40000 [IU] via SUBCUTANEOUS
  Filled 2020-01-31: qty 1

## 2020-01-31 MED ORDER — SODIUM CHLORIDE 0.9 % IV SOLN
510.0000 mg | INTRAVENOUS | Status: DC
  Administered 2020-01-31: 510 mg via INTRAVENOUS
  Filled 2020-01-31: qty 510

## 2020-02-06 ENCOUNTER — Telehealth: Payer: Self-pay | Admitting: Pharmacist

## 2020-02-06 NOTE — Progress Notes (Signed)
° ° °Chronic Care Management °Pharmacy Assistant  ° °Name: [name]  MRN: [id number] DOB: 05/27/1950 ° °Reason for Encounter: General Adherence Call ° ° °PCP : Murray, Laura Woodruff, FNP ° °Allergies:  No Known Allergies ° °Medications: °Outpatient Encounter Medications as of 02/06/2020  °Medication Sig Note  °• acarbose (PRECOSE) 25 MG tablet Take 0.5 tablets (12.5 mg total) by mouth 3 (three) times daily with meals.   °• amLODipine (NORVASC) 10 MG tablet Take 1 tablet (10 mg total) by mouth daily.   °• atorvastatin (LIPITOR) 80 MG tablet TAKE 1 TABLET BY MOUTH EVERY DAY (Patient taking differently: Take 80 mg by mouth daily.)   °• Blood Glucose Monitoring Suppl (ONETOUCH VERIO FLEX SYSTEM) w/Device KIT 1 each by Does not apply route 2 (two) times daily. E11.9   °• bromocriptine (PARLODEL) 2.5 MG tablet TAKE 1/2 TABLET BY MOUTH EVERY DAY (Patient taking differently: Take 2.5 mg by mouth daily.)   °• calcitRIOL (ROCALTROL) 0.25 MCG capsule Take 0.25 mcg by mouth daily.   °• chlorthalidone (HYGROTON) 25 MG tablet Take 25 mg by mouth daily.   °• cloNIDine (CATAPRES) 0.2 MG tablet Take 1 tablet (0.2 mg total) by mouth 3 (three) times daily.   °• epoetin alfa-epbx (RETACRIT) 40000 UNIT/ML injection Inject 40,000 Units into the skin every 28 (twenty-eight) days.   °• furosemide (LASIX) 40 MG tablet Take 40 mg by mouth 3 (three) times a week.   °• glucose blood (ONETOUCH VERIO) test strip USE AS DIRECTED TWICE DAILY   °• metoprolol succinate (TOPROL-XL) 100 MG 24 hr tablet Take 100 mg by mouth daily. Take with or immediately following a meal.   °• minoxidil (LONITEN) 2.5 MG tablet Take 2.5 mg by mouth 3 (three) times daily.   °• OneTouch Delica Lancets 33G MISC 1 each by Does not apply route 2 (two) times daily. E11.9   °• pantoprazole (PROTONIX) 40 MG tablet Take 1 tablet (40 mg total) by mouth daily.   °• polyethylene glycol (MIRALAX / GLYCOLAX) 17 g packet Take 34-51 g by mouth daily as needed for moderate  constipation. 11/20/2019: Pt does 2 to 3 top fulls   °• repaglinide (PRANDIN) 2 MG tablet Take 1 tablet (2 mg total) by mouth 2 (two) times daily before a meal.   ° °No facility-administered encounter medications on file as of 02/06/2020.  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, unspecified laterality, unspecified whether long term insulin use (HCC) 06/13/2016  °• Vitamin D deficiency 01/12/2016  °• Medicare annual wellness visit, initial 01/11/2016  °• Lacunar infarct, acute (HCC) 03/23/2015  °• Non compliance w medication regimen 01/16/2015  °• Anemia 01/08/2015  °• Stroke (HCC) 01/08/2015  °• Hyperlipemia   °• Carotid bruit present 12/13/2012  °• Routine health maintenance 10/24/2010  °• PERIPHERAL EDEMA 09/10/2009  °• ELECTROCARDIOGRAM, ABNORMAL 04/11/2008  °• HEART MURMUR, SYSTOLIC 04/09/2008  °• SEBACEOUS CYST, NECK 12/13/2006  °• Hyperthyroidism 09/27/2006  °• Secondary diabetes with peripheral vascular disease (HCC) 09/27/2006  °• Hyperlipidemia LDL goal <70 09/27/2006  °• TOBACCO ABUSE 09/27/2006  °• Essential hypertension 09/27/2006  ° ° °  Goals Addressed   °None °  ° ° °Follow-Up:  Pharmacist Review  ° °A general Adherence wellness call was made to [name] to ask how she has been doing since she last spoke with the clinical pharmacist Lindsey. The patient states that she has been doing ok but her problem is that she has lost a lot of weight and her loose skin is irritating around her bottom. She also states that she was in the hospital back in December because her blood  sugar dropped really low. The patient states that she does check her blood sugar everyday and this morning it was 125 after she ate. She states that she checks her feet for any cuts, swelling and infections and she has none. She states that she should be checking her blood pressure daily but her blood pressure cuff is broken. She states that she has had some medication changes by Dr. Sanford prescribed Chlorthalidone for blood pressure and Dr. Ellison prescribed Acarbose for her blood sugar. The patient states that her concern is the loose skin and would like to know if tere is something she can do for that. I let her know that I will pass along the information to the clinical pharmacist Lindsey. ° °Tracy Ellis, Clinical Pharmacist Assistant °[facility] °[phone] °

## 2020-02-07 ENCOUNTER — Other Ambulatory Visit: Payer: Self-pay

## 2020-02-07 ENCOUNTER — Encounter (HOSPITAL_COMMUNITY)
Admission: RE | Admit: 2020-02-07 | Discharge: 2020-02-07 | Disposition: A | Discharge status: Home / Self Care | Payer: Medicare Other | Source: Ambulatory Visit | Attending: Nephrology | Admitting: Nephrology

## 2020-02-07 DIAGNOSIS — N183 Chronic kidney disease, stage 3 unspecified: Secondary | ICD-10-CM | POA: Diagnosis not present

## 2020-02-07 MED ORDER — SODIUM CHLORIDE 0.9 % IV SOLN
510.0000 mg | INTRAVENOUS | Status: DC
  Administered 2020-02-07: 510 mg via INTRAVENOUS
  Filled 2020-02-07: qty 17

## 2020-02-20 ENCOUNTER — Other Ambulatory Visit: Payer: Self-pay

## 2020-02-24 ENCOUNTER — Telehealth: Payer: Self-pay | Admitting: Pharmacist

## 2020-02-24 ENCOUNTER — Other Ambulatory Visit: Payer: Self-pay

## 2020-02-24 ENCOUNTER — Ambulatory Visit (INDEPENDENT_AMBULATORY_CARE_PROVIDER_SITE_OTHER): Payer: Medicare Other | Admitting: Endocrinology

## 2020-02-24 VITALS — BP 180/74 | HR 57 | Ht 62.0 in | Wt 136.4 lb

## 2020-02-24 DIAGNOSIS — E113599 Type 2 diabetes mellitus with proliferative diabetic retinopathy without macular edema, unspecified eye: Secondary | ICD-10-CM | POA: Diagnosis not present

## 2020-02-24 DIAGNOSIS — N183 Chronic kidney disease, stage 3 unspecified: Secondary | ICD-10-CM

## 2020-02-24 DIAGNOSIS — E1122 Type 2 diabetes mellitus with diabetic chronic kidney disease: Secondary | ICD-10-CM | POA: Diagnosis not present

## 2020-02-24 LAB — POCT GLYCOSYLATED HEMOGLOBIN (HGB A1C): Hemoglobin A1C: 6.1 % — AB (ref 4.0–5.6)

## 2020-02-24 MED ORDER — BROMOCRIPTINE MESYLATE 2.5 MG PO TABS: 1.2500 mg | ORAL_TABLET | Freq: Every day | ORAL | 1 refills | Status: DC

## 2020-02-24 NOTE — Progress Notes (Signed)
Chronic Care Management Pharmacy Assistant   Name: [name]  MRN: [id number] DOB: 24-Nov-1950  Reason for Encounter: Chart Review   PCP : Marrian Salvage, FNP  Allergies:  No Known Allergies  Medications: Outpatient Encounter Medications as of 02/24/2020  Medication Sig Note   acarbose (PRECOSE) 25 MG tablet Take 0.5 tablets (12.5 mg total) by mouth 3 (three) times daily with meals.    amLODipine (NORVASC) 10 MG tablet Take 1 tablet (10 mg total) by mouth daily.    atorvastatin (LIPITOR) 80 MG tablet TAKE 1 TABLET BY MOUTH EVERY DAY (Patient taking differently: Take 80 mg by mouth daily.)    Blood Glucose Monitoring Suppl (ONETOUCH VERIO FLEX SYSTEM) w/Device KIT 1 each by Does not apply route 2 (two) times daily. E11.9    bromocriptine (PARLODEL) 2.5 MG tablet Take 0.5 tablets (1.25 mg total) by mouth daily.    calcitRIOL (ROCALTROL) 0.25 MCG capsule Take 0.25 mcg by mouth daily.    chlorthalidone (HYGROTON) 25 MG tablet Take 25 mg by mouth daily.    cloNIDine (CATAPRES) 0.2 MG tablet Take 1 tablet (0.2 mg total) by mouth 3 (three) times daily.    epoetin alfa-epbx (RETACRIT) 57903 UNIT/ML injection Inject 40,000 Units into the skin every 28 (twenty-eight) days.    furosemide (LASIX) 40 MG tablet Take 40 mg by mouth 3 (three) times a week.    glucose blood (ONETOUCH VERIO) test strip USE AS DIRECTED TWICE DAILY    metoprolol succinate (TOPROL-XL) 100 MG 24 hr tablet Take 100 mg by mouth daily. Take with or immediately following a meal.    minoxidil (LONITEN) 2.5 MG tablet Take 2.5 mg by mouth 3 (three) times daily.    OneTouch Delica Lancets 83F MISC 1 each by Does not apply route 2 (two) times daily. E11.9    pantoprazole (PROTONIX) 40 MG tablet Take 1 tablet (40 mg total) by mouth daily.    polyethylene glycol (MIRALAX / GLYCOLAX) 17 g packet Take 34-51 g by mouth daily as needed for moderate constipation. 11/20/2019: Pt does 2 to 3 top fulls     repaglinide (PRANDIN) 2 MG tablet Take 1 tablet (2 mg total) by mouth 2 (two) times daily before a meal.    No facility-administered encounter medications on file as of 02/24/2020.    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, unspecified laterality, unspecified whether long term insulin use ([place]) 06/13/2016   Vitamin D deficiency 01/12/2016   Medicare annual wellness visit, initial 01/11/2016   Lacunar infarct, acute ([place]) 03/23/2015   Non compliance w medication regimen 01/16/2015   Anemia 01/08/2015   Stroke ([place]) 01/08/2015   Hyperlipemia    Carotid bruit present 12/13/2012   Routine health maintenance 10/24/2010   PERIPHERAL EDEMA 09/10/2009   ELECTROCARDIOGRAM, ABNORMAL 04/11/2008   HEART MURMUR, SYSTOLIC 38/32/9191   SEBACEOUS CYST, NECK 12/13/2006   Hyperthyroidism 09/27/2006   Secondary diabetes with peripheral vascular disease ([place]) 09/27/2006   Hyperlipidemia LDL goal <70 09/27/2006   TOBACCO ABUSE 09/27/2006   Essential hypertension 09/27/2006    Goals Addressed   None  Follow-Up:  Pharmacist Review   Reviewed chart for medication changes and adherence.   No gaps in adherence identified. Patient has follow up scheduled with pharmacy team. No further action required.   Wendy Poet, [place] [phone]

## 2020-02-24 NOTE — Progress Notes (Signed)
Subjective:    Patient ID: [name], female    DOB: 20-Aug-1950, 70 y.o.   MRN: [id number]  HPI Pt returns for f/u of diabetes mellitus:  DM type: 2, but she may be developing type 1 Dx'ed: 8338 Complications: polyneuropathy, stage 5 CRI, CVA's, and PDR.   Therapy: 3 oral meds GDM: never DKA: never Severe hypoglycemia: once (2021). Pancreatitis: never Pancreatic imaging: never. Other: she cannot afford brand-name meds; she has never been on insulin, but she has taken trulicity; edema limits rx options; fructosamine indicates higher avg glucose than A1c.   Interval history: no cbg record, but states cbg's vary from 85-246.  pt states she feels well in general.   Past Medical History:  Diagnosis Date  . Acute kidney injury ([place]) 01/08/2015  . Arthritis   . Chronic back pain   . Constipation   . Cough   . Diabetes mellitus, type 2 ([place])   . Diverticulosis   . Fibroid    patient thinks this was the reason for her hysterectomy  . GERD (gastroesophageal reflux disease)   . Heart murmur   . History of sebaceous cyst   . Hyperlipemia   . Hyperplastic colon polyp   . Hypertension   . Hyperthyroidism   . Lumbar radiculopathy   . Shortness of breath 09/13/2013  . Stroke ([place]) 2015?   x4   . Tobacco abuse   . 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 ABDOMINAL HYSTERECTOMY     --?ovaries remain  . COLONOSCOPY W/ BIOPSIES  09/11/2008  . RETINOPATHY SURGERY Bilateral 2013   Dr. Zigmund Daniel    Social History   Socioeconomic History  . Marital status: Divorced    Spouse name: Not on file  . Number of children: 1  . Years of education: 47  . Highest education level: Not on file  Occupational History  . 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 Smoking status: Former Smoker    Packs/day: 0.50    Years: 35.00    Pack years:  17.50    Types: Cigarettes    Quit date: 07/07/2014    Years since quitting: 5.6  . Smokeless tobacco: Never Used  Vaping Use  . Vaping Use: Never used  Substance and Sexual Activity  . Alcohol use: No  . Drug use: No  . Sexual activity: Never    Partners: Male    Birth control/protection: Surgical    Comment: TAH--?ovaries remain  Other Topics Concern  . Not on file  Social History Narrative   Divorced 1 son    Retired Museum/gallery curator for [employer]   Former smoker no alcohol caffeine or drug use report denies abuse and feels safe at home.   Social Determinants of Health   Financial Resource Strain: Medium Risk  . Difficulty of Paying Living Expenses: Somewhat hard  Food Insecurity: Food Insecurity Present  . Worried About Charity fundraiser in the Last Year: Often true  . Ran Out of Food in the Last Year: Sometimes true  Transportation Needs: No Transportation Needs  . Lack of Transportation (Medical): No  . Lack of Transportation (Non-Medical): No  Physical Activity: Sufficiently Active  . Days of Exercise per Week: 5 days  . Minutes of Exercise per Session: 30 min  Stress: No Stress Concern Present  .  Feeling of Stress : Not at all  Social Connections: Not on file  Intimate Partner Violence: Not At Risk  . Fear of Current or Ex-Partner: No  . Emotionally Abused: No  . Physically Abused: No  . Sexually Abused: No    Current Outpatient Medications on File Prior to Visit  Medication Sig Dispense Refill  . amLODipine (NORVASC) 10 MG tablet Take 1 tablet (10 mg total) by mouth daily. 90 tablet 0  . atorvastatin (LIPITOR) 80 MG tablet TAKE 1 TABLET BY MOUTH EVERY DAY (Patient taking differently: Take 80 mg by mouth daily.) 90 tablet 3  . Blood Glucose Monitoring Suppl (ONETOUCH VERIO FLEX SYSTEM) w/Device KIT 1 each by Does not apply route 2 (two) times daily. E11.9    . calcitRIOL (ROCALTROL) 0.25 MCG capsule Take 0.25 mcg by mouth daily.    . chlorthalidone  (HYGROTON) 25 MG tablet Take 25 mg by mouth daily.    . cloNIDine (CATAPRES) 0.2 MG tablet Take 1 tablet (0.2 mg total) by mouth 3 (three) times daily. 270 tablet 3  . epoetin alfa-epbx (RETACRIT) 70350 UNIT/ML injection Inject 40,000 Units into the skin every 28 (twenty-eight) days.    . furosemide (LASIX) 40 MG tablet Take 40 mg by mouth 3 (three) times a week.    [name] glucose blood (ONETOUCH VERIO) test strip USE AS DIRECTED TWICE DAILY 200 strip 3  . metoprolol succinate (TOPROL-XL) 100 MG 24 hr tablet Take 100 mg by mouth daily. Take with or immediately following a meal.    . minoxidil (LONITEN) 2.5 MG tablet Take 2.5 mg by mouth 3 (three) times daily.    [name] Delica Lancets 09F MISC 1 each by Does not apply route 2 (two) times daily. E11.9    . pantoprazole (PROTONIX) 40 MG tablet Take 1 tablet (40 mg total) by mouth daily. 90 tablet 3  . polyethylene glycol (MIRALAX / GLYCOLAX) 17 g packet Take 34-51 g by mouth daily as needed for moderate constipation.    . repaglinide (PRANDIN) 2 MG tablet Take 1 tablet (2 mg total) by mouth 2 (two) times daily before a meal. 180 tablet 3   No current facility-administered medications on file prior to visit.    No Known Allergies  Family History  Problem Relation Age of Onset  . Hypertension Mother   . Sleep apnea Mother   . Diabetes Mother   . Hyperlipidemia Mother   . Lung cancer Father        smoker  . Hypertension Sister   . Diabetes Sister   . Hypertension Brother   . Hypertension Sister   . Diabetes Brother   . Hypertension Brother   . Breast cancer Neg Hx   . Colon cancer Neg Hx   . Esophageal cancer Neg Hx   . Pancreatic cancer Neg Hx   . Liver disease Neg Hx   . Colon polyps Neg Hx     BP (!) 180/74 (BP Location: Right Arm, Patient Position: Sitting, Cuff Size: Normal)   Pulse (!) 57   Ht $R'5\' 2"'Br$  (1.575 m)   Wt 136 lb 6.4 oz (61.9 kg)   SpO2 97%   BMI 24.95 kg/m    Review of Systems She denies hypoglycemia.     Objective:   Physical Exam VITAL SIGNS:  See vs page GENERAL: no distress Pulses: dorsalis pedis intact bilat.   MSK: no deformity of the feet CV: 1+ left and trace right leg edema Skin:  no ulcer on  the feet.  normal color and temp on the feet.   Neuro: sensation is intact to touch on the feet.   Ext: there is bilateral onychomycosis of the toenails.    Lab Results  Component Value Date   HGBA1C 6.1 (A) 02/24/2020   Fructosamine=369    Assessment & Plan:  Type 2 DM, with CVA's: uncontrolled.  I advised pt to add GLP med or insulin.  Stage 5 CRI: we discussed the fact that a1c underestimates avg glucose.   Patient Instructions  Blood tests are requested for you today.  We'll let you know about the results.  Please continue the same diabetes medications. check your blood sugar once a day.  vary the time of day when you check, between before the 3 meals, and at bedtime.  also check if you have symptoms of your blood sugar being too high or too low.  please keep a record of the readings and bring it to your next appointment here (or you can bring the meter itself).  You can write it on any piece of paper.  please call us sooner if your blood sugar goes below 70, or if you have a lot of readings over 200.  Please follow up with your kidney doctor soon, for the blood pressure.   Please come back for a follow-up appointment in 2 months.

## 2020-02-24 NOTE — Patient Instructions (Addendum)
Blood tests are requested for you today.  We'll let you know about the results.  Please continue the same diabetes medications. check your blood sugar once a day.  vary the time of day when you check, between before the 3 meals, and at bedtime.  also check if you have symptoms of your blood sugar being too high or too low.  please keep a record of the readings and bring it to your next appointment here (or you can bring the meter itself).  You can write it on any piece of paper.  please call us sooner if your blood sugar goes below 70, or if you have a lot of readings over 200.  Please follow up with your kidney doctor soon, for the blood pressure.   Please come back for a follow-up appointment in 2 months.

## 2020-02-25 ENCOUNTER — Other Ambulatory Visit: Payer: Self-pay | Admitting: *Deleted

## 2020-02-25 ENCOUNTER — Telehealth: Payer: Self-pay | Admitting: Endocrinology

## 2020-02-25 MED ORDER — ACARBOSE 25 MG PO TABS: 12.5000 mg | ORAL_TABLET | Freq: Three times a day (TID) | ORAL | 1 refills | Status: DC

## 2020-02-25 NOTE — Telephone Encounter (Signed)
Rx sent 

## 2020-02-25 NOTE — Telephone Encounter (Signed)
REFILL FOR  Acarbose (90 day)  PHARMACY  [id number] - [place], [place] - [address] AT [address]  [address], [place] [address]  Phone:  [phone] Fax:  [phone]

## 2020-02-26 LAB — FRUCTOSAMINE: Fructosamine: 369 umol/L — ABNORMAL HIGH (ref 205–285)

## 2020-02-27 ENCOUNTER — Other Ambulatory Visit: Payer: Self-pay | Admitting: Endocrinology

## 2020-02-27 MED ORDER — RYBELSUS 3 MG PO TABS: 3.0000 mg | ORAL_TABLET | Freq: Every day | ORAL | 11 refills | Status: DC

## 2020-02-28 ENCOUNTER — Ambulatory Visit (HOSPITAL_COMMUNITY)
Admission: RE | Admit: 2020-02-28 | Discharge: 2020-02-28 | Disposition: A | Discharge status: Home / Self Care | Payer: Medicare Other | Source: Ambulatory Visit | Attending: Nephrology | Admitting: Nephrology

## 2020-02-28 ENCOUNTER — Other Ambulatory Visit: Payer: Self-pay

## 2020-02-28 VITALS — BP 178/61 | HR 57 | Temp 97.6°F | Resp 20

## 2020-02-28 DIAGNOSIS — N184 Chronic kidney disease, stage 4 (severe): Secondary | ICD-10-CM | POA: Insufficient documentation

## 2020-02-28 DIAGNOSIS — N183 Chronic kidney disease, stage 3 unspecified: Secondary | ICD-10-CM

## 2020-02-28 DIAGNOSIS — D631 Anemia in chronic kidney disease: Secondary | ICD-10-CM | POA: Insufficient documentation

## 2020-02-28 LAB — POCT HEMOGLOBIN-HEMACUE: Hemoglobin: 9.2 g/dL — ABNORMAL LOW (ref 12.0–15.0)

## 2020-02-28 LAB — IRON AND TIBC
Iron: 81 ug/dL (ref 28–170)
Saturation Ratios: 35 % — ABNORMAL HIGH (ref 10.4–31.8)
TIBC: 230 ug/dL — ABNORMAL LOW (ref 250–450)
UIBC: 149 ug/dL

## 2020-02-28 LAB — FERRITIN: Ferritin: 445 ng/mL — ABNORMAL HIGH (ref 11–307)

## 2020-02-28 MED ORDER — EPOETIN ALFA-EPBX 40000 UNIT/ML IJ SOLN
INTRAMUSCULAR | Status: AC
  Filled 2020-02-28: qty 1

## 2020-02-28 MED ORDER — EPOETIN ALFA-EPBX 40000 UNIT/ML IJ SOLN
40000.0000 [IU] | INTRAMUSCULAR | Status: DC
  Administered 2020-02-28: 40000 [IU] via SUBCUTANEOUS

## 2020-02-29 LAB — PTH, INTACT AND CALCIUM
Calcium, Total (PTH): 9 mg/dL (ref 8.7–10.3)
PTH: 75 pg/mL — ABNORMAL HIGH (ref 15–65)

## 2020-03-13 DIAGNOSIS — N184 Chronic kidney disease, stage 4 (severe): Secondary | ICD-10-CM | POA: Diagnosis not present

## 2020-03-15 ENCOUNTER — Other Ambulatory Visit: Payer: Self-pay | Admitting: Family

## 2020-03-17 DIAGNOSIS — D631 Anemia in chronic kidney disease: Secondary | ICD-10-CM | POA: Diagnosis not present

## 2020-03-17 DIAGNOSIS — N2581 Secondary hyperparathyroidism of renal origin: Secondary | ICD-10-CM | POA: Diagnosis not present

## 2020-03-17 DIAGNOSIS — N184 Chronic kidney disease, stage 4 (severe): Secondary | ICD-10-CM | POA: Diagnosis not present

## 2020-03-17 DIAGNOSIS — I129 Hypertensive chronic kidney disease with stage 1 through stage 4 chronic kidney disease, or unspecified chronic kidney disease: Secondary | ICD-10-CM | POA: Diagnosis not present

## 2020-03-17 DIAGNOSIS — E1122 Type 2 diabetes mellitus with diabetic chronic kidney disease: Secondary | ICD-10-CM | POA: Diagnosis not present

## 2020-03-17 DIAGNOSIS — R809 Proteinuria, unspecified: Secondary | ICD-10-CM | POA: Diagnosis not present

## 2020-03-25 ENCOUNTER — Ambulatory Visit: Payer: Medicare Other | Admitting: Endocrinology

## 2020-03-27 ENCOUNTER — Other Ambulatory Visit: Payer: Self-pay

## 2020-03-27 ENCOUNTER — Ambulatory Visit (HOSPITAL_COMMUNITY)
Admission: RE | Admit: 2020-03-27 | Discharge: 2020-03-27 | Disposition: A | Discharge status: Home / Self Care | Payer: Medicare Other | Source: Ambulatory Visit | Attending: Nephrology | Admitting: Nephrology

## 2020-03-27 DIAGNOSIS — N184 Chronic kidney disease, stage 4 (severe): Secondary | ICD-10-CM | POA: Insufficient documentation

## 2020-03-27 DIAGNOSIS — D631 Anemia in chronic kidney disease: Secondary | ICD-10-CM | POA: Insufficient documentation

## 2020-03-27 MED ORDER — EPOETIN ALFA-EPBX 40000 UNIT/ML IJ SOLN: 40000.0000 [IU] | INTRAMUSCULAR | Status: DC

## 2020-03-27 MED ORDER — CLONIDINE HCL 0.1 MG PO TABS
0.1000 mg | ORAL_TABLET | Freq: Once | ORAL | Status: AC
  Administered 2020-03-27: 0.1 mg via ORAL

## 2020-03-27 MED ORDER — CLONIDINE HCL 0.1 MG PO TABS
ORAL_TABLET | ORAL | Status: AC
  Filled 2020-03-27: qty 1

## 2020-03-27 NOTE — Progress Notes (Signed)
Pt arrived for ESA therapy BP 187/72, 206/69.  Clonidine 0.1mg  PO given at 1000 1030 BP 222/79 HR 58 1032 BP 237/95 HR 60 Pt denies acute distress, reports she has not had her amlodipine for 2 days but took all other prescribed meds to include metoprolol, clonidine, minoxidil.  Notified Dr Marylou Flesher office, instruct pt to resumed all prescribed home meds, check BP 2x/ day over the weekend and call the office Monday.  Pt verbalized understanding

## 2020-03-30 ENCOUNTER — Other Ambulatory Visit: Payer: Self-pay | Admitting: Family

## 2020-03-30 ENCOUNTER — Other Ambulatory Visit: Payer: Self-pay

## 2020-03-30 ENCOUNTER — Encounter (HOSPITAL_COMMUNITY)
Admission: RE | Admit: 2020-03-30 | Discharge: 2020-03-30 | Disposition: A | Discharge status: Home / Self Care | Payer: Medicare Other | Source: Ambulatory Visit | Attending: Nephrology | Admitting: Nephrology

## 2020-03-30 ENCOUNTER — Telehealth: Payer: Self-pay | Admitting: Endocrinology

## 2020-03-30 DIAGNOSIS — N184 Chronic kidney disease, stage 4 (severe): Secondary | ICD-10-CM | POA: Insufficient documentation

## 2020-03-30 DIAGNOSIS — D631 Anemia in chronic kidney disease: Secondary | ICD-10-CM | POA: Insufficient documentation

## 2020-03-30 MED ORDER — ATORVASTATIN CALCIUM 80 MG PO TABS: ORAL_TABLET | ORAL | 1 refills | Status: DC

## 2020-03-30 NOTE — Telephone Encounter (Signed)
Spoke with pt regarding D/C of Rybelsus

## 2020-03-30 NOTE — Telephone Encounter (Signed)
D/c rybelsus Ret as scheduled

## 2020-03-30 NOTE — Telephone Encounter (Signed)
Patient states she stopped taking Rybelsus because when she took it, it caused her to itch and "break out" so she thinks she had an allergic reaction. If someone could give her a call to discuss at [phone]

## 2020-03-30 NOTE — Telephone Encounter (Signed)
Spoke with pt and she stated that when she took the Rybelsys she started to break out on her back and stomach and was itching. She has stopped taking the Rx.  Please Advise

## 2020-03-30 NOTE — Progress Notes (Signed)
Pt here today for ESA therapy.  BP elevated in the 423'N systolic.  Pt took all medications today and stated she did restart her amlodipine over the weekend.  Pt in no distress.  Orders from Dr sanford to reschedule pt. I instructed her to call Dr Mack Guise office when she got home today and let them know her eye has a red place as if she has a broken blood vessel and she had an eye dr appt coming up next week.  Also instructed her to continue her medications and taking her BP at home for the MD to review.  Pt verbalized understanding of  instructions and said she would call Dr Joelyn Oms.

## 2020-04-01 DIAGNOSIS — I10 Essential (primary) hypertension: Secondary | ICD-10-CM | POA: Diagnosis not present

## 2020-04-03 ENCOUNTER — Other Ambulatory Visit: Payer: Self-pay

## 2020-04-03 ENCOUNTER — Encounter (HOSPITAL_COMMUNITY): Payer: Medicare Other

## 2020-04-03 ENCOUNTER — Encounter (HOSPITAL_COMMUNITY)
Admission: RE | Admit: 2020-04-03 | Discharge: 2020-04-03 | Disposition: A | Discharge status: Home / Self Care | Payer: Medicare Other | Source: Ambulatory Visit | Attending: Nephrology | Admitting: Nephrology

## 2020-04-03 VITALS — BP 174/64 | HR 61 | Temp 97.8°F | Resp 20

## 2020-04-03 DIAGNOSIS — D631 Anemia in chronic kidney disease: Secondary | ICD-10-CM | POA: Diagnosis not present

## 2020-04-03 DIAGNOSIS — N184 Chronic kidney disease, stage 4 (severe): Secondary | ICD-10-CM | POA: Diagnosis not present

## 2020-04-03 DIAGNOSIS — N1831 Chronic kidney disease, stage 3a: Secondary | ICD-10-CM

## 2020-04-03 LAB — POCT HEMOGLOBIN-HEMACUE: Hemoglobin: 9.4 g/dL — ABNORMAL LOW (ref 12.0–15.0)

## 2020-04-03 MED ORDER — EPOETIN ALFA-EPBX 40000 UNIT/ML IJ SOLN: 40000.0000 [IU] | INTRAMUSCULAR | Status: DC

## 2020-04-03 MED ORDER — EPOETIN ALFA-EPBX 40000 UNIT/ML IJ SOLN
INTRAMUSCULAR | Status: AC
  Administered 2020-04-03: 40000 [IU] via SUBCUTANEOUS
  Filled 2020-04-03: qty 1

## 2020-04-06 ENCOUNTER — Other Ambulatory Visit: Payer: Self-pay

## 2020-04-06 ENCOUNTER — Encounter (INDEPENDENT_AMBULATORY_CARE_PROVIDER_SITE_OTHER): Payer: Medicare Other | Admitting: Ophthalmology

## 2020-04-06 DIAGNOSIS — H43813 Vitreous degeneration, bilateral: Secondary | ICD-10-CM | POA: Diagnosis not present

## 2020-04-06 DIAGNOSIS — E113593 Type 2 diabetes mellitus with proliferative diabetic retinopathy without macular edema, bilateral: Secondary | ICD-10-CM | POA: Diagnosis not present

## 2020-04-06 DIAGNOSIS — I10 Essential (primary) hypertension: Secondary | ICD-10-CM | POA: Diagnosis not present

## 2020-04-06 DIAGNOSIS — H2513 Age-related nuclear cataract, bilateral: Secondary | ICD-10-CM

## 2020-04-06 DIAGNOSIS — H35033 Hypertensive retinopathy, bilateral: Secondary | ICD-10-CM

## 2020-04-08 DIAGNOSIS — I10 Essential (primary) hypertension: Secondary | ICD-10-CM | POA: Diagnosis not present

## 2020-04-10 ENCOUNTER — Ambulatory Visit: Payer: Medicare Other | Admitting: Pharmacist

## 2020-04-10 ENCOUNTER — Other Ambulatory Visit: Payer: Self-pay

## 2020-04-10 DIAGNOSIS — E785 Hyperlipidemia, unspecified: Secondary | ICD-10-CM

## 2020-04-10 DIAGNOSIS — E1165 Type 2 diabetes mellitus with hyperglycemia: Secondary | ICD-10-CM | POA: Diagnosis not present

## 2020-04-10 DIAGNOSIS — R899 Unspecified abnormal finding in specimens from other organs, systems and tissues: Secondary | ICD-10-CM

## 2020-04-10 DIAGNOSIS — I10 Essential (primary) hypertension: Secondary | ICD-10-CM | POA: Diagnosis not present

## 2020-04-10 NOTE — Patient Instructions (Signed)
Visit Information  Phone number for Pharmacist: [phone]  Goals Addressed   None    Patient Care Plan: CCM Pharmacy Care Plan    Problem Identified: Hypertension, Hyperlipidemia, Diabetes and Chronic Kidney Disease   Priority: High    Long-Range Goal: Disease management   Start Date: 04/10/2020  Expected End Date: 10/11/2020  This Visit's Progress: On track  Priority: High  Note:   Current Barriers:  . Unable to independently monitor therapeutic efficacy . Unable to achieve control of diabetes, hypertension    Pharmacist Clinical Goal(s):  [name] Patient will achieve adherence to monitoring guidelines and medication adherence to achieve therapeutic efficacy . achieve control of DM, HTN as evidenced by improved BG and BP through collaboration with PharmD and provider.   Interventions: . 1:1 collaboration with Marrian Salvage, [place] regarding development and update of comprehensive plan of care as evidenced by provider attestation and co-signature . Inter-disciplinary care team collaboration (see longitudinal plan of care) . Comprehensive medication review performed; medication list updated in electronic medical record  Current Barriers:  . Chronic Disease Management support, education, and care coordination needs related to Hypertension, Hyperlipidemia, and Diabetes   Hypertension (BP goal < 140/90) . Not ideally controlled - SBP 142/70 at home. Historically very difficult to control with SBP 160-180 at times. Pt reports adherence with regimen and denies side effects. . Current regimen:  o Amlodipine 10 mg daily (PCP) o Metoprolol succinate 100 mg daily (PCP) o Clonidine 0.2 mg 3 times daily (nephro) o Minoxidil 2.5 mg - 2 tab BID (nephro) o Chlorthalidone 25 mg daily (nephro) o Furosemide 40 mg PRN leg swelling (nephro) . Interventions: o Discussed BP goals and benefits of medications for prevention of heart attack / stroke o Consider optimizing BP regimen - carvedilol  has higher alpha activity and improved BP-lowering over metoprolol. Pt will be establishing with new PCP over next 1-2 months so will defer changes while BP is relatively controlled. o Recommend to continue current medication  Hyperlipidemia (LDL goal < 70) . Unknown control  - Hx CVA, most recent LDL from 06/2018. Pt needs updated lipid panel to determine therapy goals/options. . Current regimen:  o Atorvastatin 80 mg daily . Interventions: o Discussed cholesterol goals and benefits of medications for prevention of heart attack / stroke o Consider ezetimibe 10 mg if next LDL is > 70 o Recommend to continue current medication  Diabetes (A1c goal < 8%) . Uncontrolled - managed per Dr Loanne Drilling. Dx 2007, uses fructosamine to estimate A1c given hx anemia. Pt reports home BG 120-140 lately. Recently she tried Rybelsus but got a rash after several days so stopped it. Likely will start insulin when following up with endocrine this month. . Current regimen:  o Acarbose 25 mg - 1/2 tab 3 times daily o Repaglinide 2 mg 2 times daily o Bromocriptine 2.5 mg daily . Previously tried/failed meds:  Rybelsus 3 mg (rash) . Interventions: o Discussed importance of maintaining sugars at goal to prevent complications of diabetes including kidney damage, retinal damage, and cardiovascular disease o Recommend to continue current medication and keep appt with Dr Loanne Drilling  CKD Stage 4 . Controlled   - follows with nephrology, Dr Joelyn Oms. Kidney function somewhat stable. Gets periodic iron infusions and Retacrit injections for iron-deficiency anemia. Recently stopped calcitriol and monitoring PTH. . Current regimen:  o Retacrit 40,000 units q28 days . Interventions: o Recommend to continue current medication  Patient Goals/Self-Care Activities . Patient will:  - take medications as prescribed  focus on medication adherence by pill box check glucose daily, document, and provide at future appointments check  blood pressure daily, document, and provide at future appointments target a minimum of 150 minutes of moderate intensity exercise weekly engage in dietary modifications by reducing salt and carbohydrates  Follow Up Plan: Telephone follow up appointment with care management team member scheduled for: 6 months      The patient verbalized understanding of instructions, educational materials, and care plan provided today and declined offer to receive copy of patient instructions, educational materials, and care plan.  Telephone follow up appointment with pharmacy team member scheduled for: 6 months  Charlene Brooke, PharmD, [facility] Clinical Pharmacist [facility] [phone]

## 2020-04-10 NOTE — Progress Notes (Addendum)
Chronic Care Management Pharmacy Note  04/10/2020 Name:  [name] MRN:  [id number] DOB:  Dec 26, 1950  Subjective: [name] is an 70 y.o. year old female who is a primary patient of Marrian Salvage, [facility].  The CCM team was consulted for assistance with disease management and care coordination needs.    Engaged with patient by telephone for follow up visit in response to provider referral for pharmacy case management and/or care coordination services.   Consent to Services:  The patient was given the following information about Chronic Care Management services today, agreed to services, and gave verbal consent: 1. CCM service includes personalized support from designated clinical staff supervised by the primary care provider, including individualized plan of care and coordination with other care providers 2. 24/7 contact phone numbers for assistance for urgent and routine care needs. 3. Service will only be billed when office clinical staff spend 20 minutes or more in a month to coordinate care. 4. Only one practitioner may furnish and bill the service in a calendar month. 5.The patient may stop CCM services at any time (effective at the end of the month) by phone call to the office staff. 6. The patient will be responsible for cost sharing (co-pay) of up to 20% of the service fee (after annual deductible is met). Patient agreed to services and consent obtained.  Patient Care Team: Marrian Salvage, [place] as PCP - General (Internal Medicine) Hayden Pedro, MD (Ophthalmology) Charlton Haws, [facility] as Pharmacist (Pharmacist)  Recent office visits: 12/16/19 NP Jodi Mourning OV: hospital f/u. Amlodipine was added @ SNF. Flu shot and TDAP given, plan for covid booster in 2 weeks.  Recent consult visits: 02/28/20, 03/27/20 Retacrit injection. 03/17/20 Dr Joelyn Oms (nephrology): increased minoxidil to 5 mg BID. Stopped calcitriol and will monitor PTH.  02/24/20 Dr Loanne Drilling  (endocrine): frustosamine-A1c ~9.2%. Rx'd Rybelsus. Eventually stopped d/t itching/rash. 01/31/20 Feraheme injection 01/10/20 Dr Loanne Drilling (endocrine): changed repaglinide to 2 mg BID (w/ meals). 12/23/19 Dr Loanne Drilling (endocrine): DM dx 2007, edema limits Rx options. Estimated A1c from fructosamine is 8.5%. Added acarbose 1/2 pill TID.  Hospital visits: 01/09/20 ED visit: hypoglycemia  11/27/19-12/03/19 hospitalized s/p fall. Discharged to SNF. STOP amlodipine 10 mg, aspirin, furosemide, metoprolol succinate, telmisartan-HCTZ. Started metoprolol tartrate 50 mg BID  Objective:  Lab Results  Component Value Date   CREATININE 2.87 (H) 01/31/2020   BUN 61 (H) 01/31/2020   GFR 25.74 (L) 07/11/2018   GFRNONAA 17 (L) 01/31/2020   GFRAA 25 (L) 09/09/2017   NA 134 (L) 01/31/2020   K 4.1 01/31/2020   CALCIUM 9.0 02/28/2020   CO2 22 01/31/2020   GLUCOSE 193 (H) 01/31/2020    Lab Results  Component Value Date/Time   HGBA1C 6.1 (A) 02/24/2020 11:03 AM   HGBA1C 6.8 (A) 12/23/2019 09:09 AM   HGBA1C 8.0 (H) 11/29/2019 04:25 AM   HGBA1C 8.3 (H) 11/28/2019 05:00 AM   FRUCTOSAMINE 369 (H) 02/24/2020 11:35 AM   FRUCTOSAMINE 349 (H) 12/23/2019 09:30 AM   GFR 25.74 (L) 07/11/2018 12:58 PM   GFR 31.36 (L) 09/13/2017 01:57 PM   MICROALBUR 12.8 (H) 08/28/2015 11:49 AM   MICROALBUR 140.0 Verified by manual dilution. mg/dL (H) 04/09/2008 09:37 AM    Last diabetic Eye exam:  Lab Results  Component Value Date/Time   HMDIABEYEEXA No Retinopathy 02/01/2018 12:00 AM    Last diabetic Foot exam: No results found for: HMDIABFOOTEX   Lab Results  Component Value Date   CHOL 155  07/11/2018   HDL 44.70 07/11/2018   LDLCALC 80 07/11/2018   LDLDIRECT 79.0 07/10/2017   TRIG 151.0 (H) 07/11/2018   CHOLHDL 3 07/11/2018    Hepatic Function Latest Ref Rng & Units 01/31/2020 11/28/2019 11/07/2019  Total Protein 6.5 - 8.1 g/dL - 6.4(L) -  Albumin 3.5 - 5.0 g/dL 3.0(L) 2.8(L) 2.4(L)  AST 15 - 41 U/L - 27 -  ALT 0 -  44 U/L - 29 -  Alk Phosphatase 38 - 126 U/L - 87 -  Total Bilirubin 0.3 - 1.2 mg/dL - 0.7 -  Bilirubin, Direct 0.0 - 0.2 mg/dL - - -    Lab Results  Component Value Date/Time   TSH 1.83 09/24/2019 10:44 AM   TSH 1.854 09/09/2017 06:34 AM   TSH 1.56 09/08/2017 03:15 PM    CBC Latest Ref Rng & Units 04/03/2020 02/28/2020 01/31/2020  WBC 4.0 - 10.5 K/uL - - -  Hemoglobin 12.0 - 15.0 g/dL 8.4(P) 4.2(C) 7.4(L)  Hematocrit 36.0 - 46.0 % - - -  Platelets 150 - 400 K/uL - - -    Lab Results  Component Value Date/Time   VD25OH 33.21 07/11/2018 12:58 PM   VD25OH >120.00 (HH) 09/08/2017 03:15 PM    Clinical ASCVD: Yes  The ASCVD Risk score Denman George DC Jr., et al., 2013) failed to calculate for the following reasons:   The patient has a prior MI or stroke diagnosis    Depression screen [facility] 2/9 08/23/2019 08/17/2018 07/07/2017  Decreased Interest 0 0 0  Down, Depressed, Hopeless 0 0 0  PHQ - 2 Score 0 0 0  Some recent data might be hidden     Social History   Tobacco Use  Smoking Status Former Smoker   Packs/day: 0.50   Years: 35.00   Pack years: 17.50   Types: Cigarettes   Quit date: 07/07/2014   Years since quitting: 5.7  Smokeless Tobacco Never Used   BP Readings from Last 3 Encounters:  04/03/20 (!) 174/64  03/30/20 (!) 205/75  03/27/20 (!) 206/69   Pulse Readings from Last 3 Encounters:  04/03/20 61  03/30/20 67  03/27/20 (!) 59   Wt Readings from Last 3 Encounters:  02/24/20 136 lb 6.4 oz (61.9 kg)  01/10/20 141 lb (64 kg)  12/23/19 147 lb (66.7 kg)   BMI Readings from Last 3 Encounters:  02/24/20 24.95 kg/m  01/10/20 25.79 kg/m  12/23/19 26.89 kg/m    Assessment/Interventions: Review of patient past medical history, allergies, medications, health status, including review of consultants reports, laboratory and other test data, was performed as part of comprehensive evaluation and provision of chronic care management services.   SDOH:  (Social Determinants of  Health) assessments and interventions performed: Yes    SDOH Screenings   Alcohol Screen: Low Risk    Last Alcohol Screening Score (AUDIT): 0  Depression (PHQ2-9): Low Risk    PHQ-2 Score: 0  Financial Resource Strain: Medium Risk   Difficulty of Paying Living Expenses: Somewhat hard  Food Insecurity: Food Insecurity Present   Worried About Programme researcher, broadcasting/film/video in the Last Year: Often true   Barista in the Last Year: Sometimes true  Housing: Low Risk    Last Housing Risk Score: 0  Physical Activity: Sufficiently Active   Days of Exercise per Week: 5 days   Minutes of Exercise per Session: 30 min  Social Connections: Not on file  Stress: No Stress Concern Present   Feeling of Stress :  Not at all  Tobacco Use: Medium Risk   Smoking Tobacco Use: Former Smoker   Smokeless Tobacco Use: Never Used  Transportation Needs: No Data processing manager (Medical): No   Lack of Transportation (Non-Medical): No    CCM Care Plan  Allergies  Allergen Reactions   Semaglutide Rash    Rybelsus 3 mg    Medications Reviewed Today     Reviewed by Charlton Haws, [facility] (Pharmacist) on 04/10/20 at 1416  Med List Status: <None>   Medication Order Taking? Sig Documenting Provider Last Dose Status Informant  acarbose (PRECOSE) 25 MG tablet [id number] Yes Take 0.5 tablets (12.5 mg total) by mouth 3 (three) times daily with meals. Renato Shin, MD Taking Active   amLODipine [facility]) 10 MG tablet [id number] Yes TAKE 1 TABLET BY MOUTH EVERY DAY Marrian Salvage, FNP Taking Active   atorvastatin (LIPITOR) 80 MG tablet [id number] Yes TAKE 1 TABLET BY MOUTH EVERY DAY Marrian Salvage, FNP Taking Active   Blood Glucose Monitoring Suppl ([place]) w/Device KIT [id number] Yes 1 each by Does not apply route 2 (two) times daily. E11.9 [provider] Taking Active Self  bromocriptine (PARLODEL) 2.5 MG tablet [id number] Yes Take 0.5 tablets  (1.25 mg total) by mouth daily. Renato Shin, MD Taking Active   chlorthalidone (HYGROTON) 25 MG tablet [id number] Yes Take 25 mg by mouth daily. [provider] Taking Active Self  cloNIDine (CATAPRES) 0.2 MG tablet [id number] Yes Take 1 tablet (0.2 mg total) by mouth 3 (three) times daily. Marrian Salvage, FNP Taking Active Self  epoetin alfa-epbx (RETACRIT) 82423 UNIT/ML injection [id number] Yes Inject 40,000 Units into the skin every 28 (twenty-eight) days. [provider] Taking Active   furosemide (LASIX) 40 MG tablet [id number] Yes Take 40 mg by mouth 3 (three) times a week. PRN leg swelling [provider] Taking Active   glucose blood (ONETOUCH VERIO) test strip [id number] Yes USE AS DIRECTED TWICE DAILY Marrian Salvage, FNP Taking Active   metoprolol succinate (TOPROL-XL) 100 MG 24 hr tablet [id number] Yes Take 100 mg by mouth daily. Take with or immediately following a meal. [provider] Taking Active   minoxidil (LONITEN) 10 MG tablet [id number] Yes Take 10 mg by mouth 2 (two) times daily. [provider] Taking Active   OneTouch Delica Lancets 73A MISC [id number] Yes 1 each by Does not apply route 2 (two) times daily. E11.9 [provider] Taking Active Self  pantoprazole (PROTONIX) 40 MG tablet [id number] Yes Take 1 tablet (40 mg total) by mouth daily. Marrian Salvage, FNP Taking Active Self  polyethylene glycol (MIRALAX / GLYCOLAX) 17 g packet [id number] Yes Take 34-51 g by mouth daily as needed for moderate constipation. [provider] Taking Active Self           Med Note Caryn Section, Marylyn Ishihara A   Wed Nov 20, 2019  2:18 PM) Pt does 2 to 3 top fulls   repaglinide (PRANDIN) 2 MG tablet [id number] Yes Take 1 tablet (2 mg total) by mouth 2 (two) times daily before a meal. 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, unspecified laterality, unspecified whether long term insulin use ([place]) 06/13/2016   Vitamin D deficiency 01/12/2016   Medicare annual wellness visit, initial 01/11/2016   Lacunar infarct, acute ([place]) 03/23/2015   Non compliance w medication regimen 01/16/2015   Anemia 01/08/2015   Stroke ([place]) 01/08/2015   Hyperlipemia    Carotid bruit present 12/13/2012   Routine health maintenance 10/24/2010   PERIPHERAL EDEMA 09/10/2009   ELECTROCARDIOGRAM, ABNORMAL 04/11/2008   HEART MURMUR, SYSTOLIC 81/27/5170   SEBACEOUS CYST, NECK 12/13/2006   Hyperthyroidism 09/27/2006   Secondary diabetes with peripheral vascular disease ([place]) 09/27/2006   Hyperlipidemia LDL goal <70 09/27/2006   TOBACCO ABUSE 09/27/2006   Essential hypertension 09/27/2006    Immunization History  Administered Date(s) Administered   Fluad Quad(high Dose 65+) 12/16/2019   Influenza Whole 10/25/2011   Influenza, High Dose Seasonal PF 01/11/2016, 12/05/2016, 12/15/2017   Influenza,inj,Quad PF,6+ Mos 12/13/2012, 10/07/2014   PFIZER(Purple Top)SARS-COV-2 Vaccination 03/21/2019, 04/18/2019, 01/05/2020   Pneumococcal Conjugate-13 08/28/2015   Pneumococcal Polysaccharide-23 12/13/2011, 07/07/2017   Td 09/09/2008   Tdap 12/16/2019   Zoster 12/13/2012    Conditions to be addressed/monitored:  Hypertension, Hyperlipidemia, Diabetes and Chronic Kidney Disease  Care Plan : [place]  Updates made by Charlton Haws, [place] since 04/10/2020 12:00 AM     Problem: Hypertension, Hyperlipidemia, Diabetes and Chronic Kidney Disease   Priority: High     Long-Range Goal:  Disease management   Start Date: 04/10/2020  Expected End Date: 10/11/2020  This Visit's Progress: On track  Priority: High  Note:   Current Barriers:  Unable to independently monitor therapeutic efficacy Unable to achieve control of diabetes, hypertension    Pharmacist Clinical Goal(s):  Patient will achieve adherence to monitoring guidelines and medication adherence to achieve therapeutic efficacy achieve control of DM, HTN as evidenced by improved BG and BP through collaboration with PharmD and provider.   Interventions: 1:1 collaboration with Marrian Salvage, FNP regarding development and update of comprehensive plan of care as evidenced by provider attestation and co-signature Inter-disciplinary care team collaboration (see longitudinal plan of care) Comprehensive medication review performed; medication list updated in electronic medical record  Current Barriers:  Chronic Disease Management support, education, and care coordination needs related to Hypertension, Hyperlipidemia, and Diabetes   Hypertension (BP goal < 140/90) Not ideally controlled - SBP 142/70 at home. Historically very difficult to control with SBP 160-180 at times. Pt reports adherence with regimen and denies side effects. Current regimen:  Amlodipine 10 mg daily (PCP) Metoprolol succinate 100 mg daily (PCP) Clonidine 0.2 mg 3 times daily (nephro) Minoxidil 2.5 mg - 2 tab BID (nephro) Chlorthalidone 25 mg daily (nephro) Furosemide 40 mg PRN leg swelling (nephro) Interventions: Discussed BP goals and benefits of medications for prevention of heart attack / stroke Consider optimizing BP regimen - carvedilol has higher alpha activity and improved BP-lowering over metoprolol. Pt will be establishing with new PCP over next 1-2 months so will defer changes while BP is relatively controlled. Recommend to continue current medication  Hyperlipidemia (LDL goal < 70) Unknown control  - Hx CVA, most recent LDL  from 06/2018. Pt needs updated lipid panel to determine therapy goals/options. Current regimen:  Atorvastatin 80 mg daily Interventions: Discussed cholesterol goals and benefits of medications for prevention of heart attack / stroke Consider ezetimibe 10 mg if next LDL is >  70 Recommend to continue current medication  Diabetes (A1c goal < 8%) Uncontrolled - managed per Dr Loanne Drilling. Dx 2007, uses fructosamine to estimate A1c given hx anemia. Pt reports home BG 120-140 lately. Recently she tried Rybelsus but got a rash after several days so stopped it. Likely will start insulin when following up with endocrine this month. Current regimen:  Acarbose 25 mg - 1/2 tab 3 times daily Repaglinide 2 mg 2 times daily Bromocriptine 2.5 mg daily Previously tried/failed meds:  Rybelsus 3 mg (rash) Interventions: Discussed importance of maintaining sugars at goal to prevent complications of diabetes including kidney damage, retinal damage, and cardiovascular disease Recommend to continue current medication and keep appt with Dr Loanne Drilling  CKD Stage 4 Controlled   - follows with nephrology, Dr Joelyn Oms. Kidney function somewhat stable. Gets periodic iron infusions and Retacrit injections for iron-deficiency anemia. Recently stopped calcitriol and monitoring PTH. Current regimen:  Retacrit 40,000 units q28 days Interventions: Recommend to continue current medication  Patient Goals/Self-Care Activities Patient will:  - take medications as prescribed focus on medication adherence by pill box check glucose daily, document, and provide at future appointments check blood pressure daily, document, and provide at future appointments target a minimum of 150 minutes of moderate intensity exercise weekly engage in dietary modifications by reducing salt and carbohydrates  Follow Up Plan: Telephone follow up appointment with care management team member scheduled for: 6 months      Medication Assistance: None  required.  Patient affirms current coverage meets needs.  Patient's preferred pharmacy is:  [id number] - [place], [place] [address] [place] [address] Phone: [phone] Fax: [phone]  [place], [place], [address] [place], [place] [address] [place] [address] Phone: [phone] Fax: [phone]  Uses pill box? Yes Pt endorses 100% compliance  We discussed: Current pharmacy is preferred with insurance plan and patient is satisfied with pharmacy services Patient decided to: Continue current medication management strategy  Care Plan and Follow Up Patient Decision:  Patient agrees to Care Plan and Follow-up.  Plan: Telephone follow up appointment with care management team member scheduled for:  6 months  Charlene Brooke, PharmD, BCACP Clinical Pharmacist [facility] [phone]  I have personally reviewed this encounter including the documentation in this note and have collaborated with the care management provider regarding care management and care coordination activities to include development and update of the comprehensive care plan. I am certifying that I agree with the content of this note and encounter as supervising physician.

## 2020-04-13 DIAGNOSIS — H25813 Combined forms of age-related cataract, bilateral: Secondary | ICD-10-CM | POA: Diagnosis not present

## 2020-04-23 ENCOUNTER — Encounter: Payer: Self-pay | Admitting: Endocrinology

## 2020-04-23 ENCOUNTER — Other Ambulatory Visit: Payer: Self-pay

## 2020-04-23 ENCOUNTER — Ambulatory Visit (INDEPENDENT_AMBULATORY_CARE_PROVIDER_SITE_OTHER): Payer: Medicare Other | Admitting: Endocrinology

## 2020-04-23 VITALS — BP 158/64 | HR 56 | Ht 63.0 in | Wt 136.0 lb

## 2020-04-23 DIAGNOSIS — E1165 Type 2 diabetes mellitus with hyperglycemia: Secondary | ICD-10-CM

## 2020-04-23 LAB — POCT GLYCOSYLATED HEMOGLOBIN (HGB A1C): Hemoglobin A1C: 6.1 % — AB (ref 4.0–5.6)

## 2020-04-23 NOTE — Progress Notes (Signed)
Subjective:    Patient ID: [name], female    DOB: 03-10-1950, 70 y.o.   MRN: [id number]  HPI Pt returns for f/u of diabetes mellitus:  DM type: 2, but she may be developing type 1.   Dx'ed: 7867 Complications: polyneuropathy, stage 5 CRI, CVA's, and PDR.   Therapy: 3 oral meds GDM: never DKA: never Severe hypoglycemia: once (2021).   Pancreatitis: never Pancreatic imaging: never. Other: she cannot afford brand-name meds; she has never been on insulin; edema limits rx options; fructosamine indicates much higher avg glucose than A1c.   Interval history: no cbg record, but states cbg's vary from 95-150.  pt states she feels well in general.   She declines to start insulin until we recheck fructosamine today.   Past Medical History:  Diagnosis Date  . Acute kidney injury ([place]) 01/08/2015  . Arthritis   . Chronic back pain   . Constipation   . Cough   . Diabetes mellitus, type 2 ([place])   . Diverticulosis   . Fibroid    patient thinks this was the reason for her hysterectomy  . GERD (gastroesophageal reflux disease)   . Heart murmur   . History of sebaceous cyst   . Hyperlipemia   . Hyperplastic colon polyp   . Hypertension   . Hyperthyroidism   . Lumbar radiculopathy   . Shortness of breath 09/13/2013  . Stroke ([place]) 2015?   x4   . Tobacco abuse   . 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 ABDOMINAL HYSTERECTOMY     --?ovaries remain  . COLONOSCOPY W/ BIOPSIES  09/11/2008  . RETINOPATHY SURGERY Bilateral 2013   Dr. Zigmund Daniel    Social History   Socioeconomic History  . Marital status: Divorced    Spouse name: Not on file  . Number of children: 1  . Years of education: 67  . Highest education level: Not on file  Occupational History  . 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 Smoking status: Former Smoker     Packs/day: 0.50    Years: 35.00    Pack years: 17.50    Types: Cigarettes    Quit date: 07/07/2014    Years since quitting: 5.8  . Smokeless tobacco: Never Used  Vaping Use  . Vaping Use: Never used  Substance and Sexual Activity  . Alcohol use: No  . Drug use: No  . Sexual activity: Never    Partners: Male    Birth control/protection: Surgical    Comment: TAH--?ovaries remain  Other Topics Concern  . Not on file  Social History Narrative   Divorced 1 son    Retired Museum/gallery curator for [employer]   Former smoker no alcohol caffeine or drug use report denies abuse and feels safe at home.   Social Determinants of Health   Financial Resource Strain: Medium Risk  . Difficulty of Paying Living Expenses: Somewhat hard  Food Insecurity: Food Insecurity Present  . Worried About Charity fundraiser in the Last Year: Often true  . Ran Out of Food in the Last Year: Sometimes true  Transportation Needs: No Transportation Needs  . Lack of Transportation (Medical): No  . Lack of Transportation (Non-Medical): No  Physical Activity: Sufficiently Active  . Days of Exercise per Week: 5 days  . Minutes of Exercise  per Session: 30 min  Stress: No Stress Concern Present  . Feeling of Stress : Not at all  Social Connections: Not on file  Intimate Partner Violence: Not At Risk  . Fear of Current or Ex-Partner: No  . Emotionally Abused: No  . Physically Abused: No  . Sexually Abused: No    Current Outpatient Medications on File Prior to Visit  Medication Sig Dispense Refill  . acarbose (PRECOSE) 25 MG tablet Take 0.5 tablets (12.5 mg total) by mouth 3 (three) times daily with meals. 135 tablet 1  . amLODipine (NORVASC) 10 MG tablet TAKE 1 TABLET BY MOUTH EVERY DAY 90 tablet 0  . atorvastatin (LIPITOR) 80 MG tablet TAKE 1 TABLET BY MOUTH EVERY DAY 90 tablet 1  . Blood Glucose Monitoring Suppl (ONETOUCH VERIO FLEX SYSTEM) w/Device KIT 1 each by Does not apply route 2 (two) times  daily. E11.9    . bromocriptine (PARLODEL) 2.5 MG tablet Take 0.5 tablets (1.25 mg total) by mouth daily. 45 tablet 1  . chlorthalidone (HYGROTON) 25 MG tablet Take 25 mg by mouth daily.    . cloNIDine (CATAPRES) 0.2 MG tablet Take 1 tablet (0.2 mg total) by mouth 3 (three) times daily. 270 tablet 3  . epoetin alfa-epbx (RETACRIT) 14431 UNIT/ML injection Inject 40,000 Units into the skin every 28 (twenty-eight) days.    . furosemide (LASIX) 40 MG tablet Take 40 mg by mouth 3 (three) times a week. PRN leg swelling    . glucose blood (ONETOUCH VERIO) test strip USE AS DIRECTED TWICE DAILY 200 strip 3  . metoprolol succinate (TOPROL-XL) 100 MG 24 hr tablet Take 100 mg by mouth daily. Take with or immediately following a meal.    . minoxidil (LONITEN) 10 MG tablet Take 10 mg by mouth 2 (two) times daily.    [name] Delica Lancets 54M MISC 1 each by Does not apply route 2 (two) times daily. E11.9    . pantoprazole (PROTONIX) 40 MG tablet Take 1 tablet (40 mg total) by mouth daily. 90 tablet 3  . polyethylene glycol (MIRALAX / GLYCOLAX) 17 g packet Take 34-51 g by mouth daily as needed for moderate constipation.    . repaglinide (PRANDIN) 2 MG tablet Take 1 tablet (2 mg total) by mouth 2 (two) times daily before a meal. 180 tablet 3   No current facility-administered medications on file prior to visit.    Allergies  Allergen Reactions  . Semaglutide Rash    Rybelsus 3 mg    Family History  Problem Relation Age of Onset  . Hypertension Mother   . Sleep apnea Mother   . Diabetes Mother   . Hyperlipidemia Mother   . Lung cancer Father        smoker  . Hypertension Sister   . Diabetes Sister   . Hypertension Brother   . Hypertension Sister   . Diabetes Brother   . Hypertension Brother   . Breast cancer Neg Hx   . Colon cancer Neg Hx   . Esophageal cancer Neg Hx   . Pancreatic cancer Neg Hx   . Liver disease Neg Hx   . Colon polyps Neg Hx     BP (!) 158/64   Pulse (!) 56   Ht  $R'5\' 3"'uw$  (1.6 m)   Wt 136 lb (61.7 kg)   SpO2 98%   BMI 24.09 kg/m    Review of Systems     Objective:   Physical Exam VITAL SIGNS:  See  vs page GENERAL: no distress Pulses: dorsalis pedis intact bilat.   MSK: no deformity of the feet.  CV: trace bilat leg edema.   Skin:  no ulcer on the feet.  normal color and temp on the feet.   Neuro: sensation is intact to touch on the feet.   Ext: there is bilateral onychomycosis of the toenails.    Lab Results  Component Value Date   HGBA1C 6.1 (A) 02/24/2020       Assessment & Plan:  Type 2 DM: uncertain glycemic control: Please continue the same 3 diabetes medications.  stage 5 CRI: check fructosamine.  HTN: is noted today.    Patient Instructions  Blood tests are requested for you today.  We'll let you know about the results.  If this is high, you should start taking the insulin.   check your blood sugar once a day.  vary the time of day when you check, between before the 3 meals, and at bedtime.  also check if you have symptoms of your blood sugar being too high or too low.  please keep a record of the readings and bring it to your next appointment here (or you can bring the meter itself).  You can write it on any piece of paper.  please call us sooner if your blood sugar goes below 70, or if you have a lot of readings over 200.   Please follow up with your kidney doctor soon, for the blood pressure.   Please come back for a follow-up appointment in 2 months.

## 2020-04-23 NOTE — Patient Instructions (Addendum)
Blood tests are requested for you today.  We'll let you know about the results.  If this is high, you should start taking the insulin.   check your blood sugar once a day.  vary the time of day when you check, between before the 3 meals, and at bedtime.  also check if you have symptoms of your blood sugar being too high or too low.  please keep a record of the readings and bring it to your next appointment here (or you can bring the meter itself).  You can write it on any piece of paper.  please call us sooner if your blood sugar goes below 70, or if you have a lot of readings over 200.   Please follow up with your kidney doctor soon, for the blood pressure.   Please come back for a follow-up appointment in 2 months.

## 2020-04-24 ENCOUNTER — Encounter (HOSPITAL_COMMUNITY): Payer: Medicare Other

## 2020-04-27 ENCOUNTER — Other Ambulatory Visit: Payer: Self-pay | Admitting: Endocrinology

## 2020-04-27 DIAGNOSIS — E113599 Type 2 diabetes mellitus with proliferative diabetic retinopathy without macular edema, unspecified eye: Secondary | ICD-10-CM

## 2020-04-27 LAB — FRUCTOSAMINE: Fructosamine: 354 umol/L — ABNORMAL HIGH (ref 205–285)

## 2020-05-01 ENCOUNTER — Ambulatory Visit (HOSPITAL_COMMUNITY)
Admission: RE | Admit: 2020-05-01 | Discharge: 2020-05-01 | Disposition: A | Discharge status: Home / Self Care | Payer: Medicare Other | Source: Ambulatory Visit | Attending: Nephrology | Admitting: Nephrology

## 2020-05-01 ENCOUNTER — Other Ambulatory Visit: Payer: Self-pay

## 2020-05-01 DIAGNOSIS — N184 Chronic kidney disease, stage 4 (severe): Secondary | ICD-10-CM | POA: Diagnosis not present

## 2020-05-01 DIAGNOSIS — D631 Anemia in chronic kidney disease: Secondary | ICD-10-CM | POA: Diagnosis not present

## 2020-05-01 LAB — IRON AND TIBC
Iron: 47 ug/dL (ref 28–170)
Saturation Ratios: 20 % (ref 10.4–31.8)
TIBC: 238 ug/dL — ABNORMAL LOW (ref 250–450)
UIBC: 191 ug/dL

## 2020-05-01 LAB — FERRITIN: Ferritin: 240 ng/mL (ref 11–307)

## 2020-05-01 LAB — RENAL FUNCTION PANEL
Albumin: 3.3 g/dL — ABNORMAL LOW (ref 3.5–5.0)
Anion gap: 6 (ref 5–15)
BUN: 82 mg/dL — ABNORMAL HIGH (ref 8–23)
CO2: 24 mmol/L (ref 22–32)
Calcium: 8.4 mg/dL — ABNORMAL LOW (ref 8.9–10.3)
Chloride: 105 mmol/L (ref 98–111)
Creatinine, Ser: 3.51 mg/dL — ABNORMAL HIGH (ref 0.44–1.00)
GFR, Estimated: 13 mL/min — ABNORMAL LOW (ref >60)
Glucose, Bld: 132 mg/dL — ABNORMAL HIGH (ref 70–99)
Phosphorus: 4.8 mg/dL — ABNORMAL HIGH (ref 2.5–4.6)
Potassium: 4.3 mmol/L (ref 3.5–5.1)
Sodium: 135 mmol/L (ref 135–145)

## 2020-05-01 LAB — POCT HEMOGLOBIN-HEMACUE: Hemoglobin: 8.1 g/dL — ABNORMAL LOW (ref 12.0–15.0)

## 2020-05-01 MED ORDER — EPOETIN ALFA-EPBX 40000 UNIT/ML IJ SOLN: 40000.0000 [IU] | INTRAMUSCULAR | Status: DC

## 2020-05-01 MED ORDER — EPOETIN ALFA-EPBX 40000 UNIT/ML IJ SOLN
INTRAMUSCULAR | Status: AC
  Administered 2020-05-01: 40000 [IU]
  Filled 2020-05-01: qty 1

## 2020-05-07 DIAGNOSIS — R109 Unspecified abdominal pain: Secondary | ICD-10-CM | POA: Insufficient documentation

## 2020-05-07 DIAGNOSIS — R7989 Other specified abnormal findings of blood chemistry: Secondary | ICD-10-CM | POA: Diagnosis not present

## 2020-05-07 DIAGNOSIS — R208 Other disturbances of skin sensation: Secondary | ICD-10-CM | POA: Diagnosis not present

## 2020-05-07 DIAGNOSIS — R195 Other fecal abnormalities: Secondary | ICD-10-CM | POA: Diagnosis not present

## 2020-05-08 ENCOUNTER — Encounter (HOSPITAL_COMMUNITY): Payer: Self-pay

## 2020-05-08 ENCOUNTER — Emergency Department (HOSPITAL_COMMUNITY)
Admission: EM | Admit: 2020-05-08 | Discharge: 2020-05-08 | Disposition: A | Discharge status: Home / Self Care | Payer: Medicare Other | Attending: Emergency Medicine | Admitting: Emergency Medicine

## 2020-05-08 ENCOUNTER — Emergency Department (HOSPITAL_COMMUNITY): Payer: Medicare Other

## 2020-05-08 DIAGNOSIS — Z7984 Long term (current) use of oral hypoglycemic drugs: Secondary | ICD-10-CM | POA: Insufficient documentation

## 2020-05-08 DIAGNOSIS — Z87891 Personal history of nicotine dependence: Secondary | ICD-10-CM | POA: Diagnosis not present

## 2020-05-08 DIAGNOSIS — E113599 Type 2 diabetes mellitus with proliferative diabetic retinopathy without macular edema, unspecified eye: Secondary | ICD-10-CM | POA: Insufficient documentation

## 2020-05-08 DIAGNOSIS — I129 Hypertensive chronic kidney disease with stage 1 through stage 4 chronic kidney disease, or unspecified chronic kidney disease: Secondary | ICD-10-CM | POA: Insufficient documentation

## 2020-05-08 DIAGNOSIS — K922 Gastrointestinal hemorrhage, unspecified: Secondary | ICD-10-CM | POA: Insufficient documentation

## 2020-05-08 DIAGNOSIS — E1165 Type 2 diabetes mellitus with hyperglycemia: Secondary | ICD-10-CM | POA: Diagnosis not present

## 2020-05-08 DIAGNOSIS — N189 Chronic kidney disease, unspecified: Secondary | ICD-10-CM | POA: Diagnosis not present

## 2020-05-08 DIAGNOSIS — K921 Melena: Secondary | ICD-10-CM

## 2020-05-08 DIAGNOSIS — Z79899 Other long term (current) drug therapy: Secondary | ICD-10-CM | POA: Diagnosis not present

## 2020-05-08 LAB — CBC
HCT: 28.2 % — ABNORMAL LOW (ref 36.0–46.0)
Hemoglobin: 9.1 g/dL — ABNORMAL LOW (ref 12.0–15.0)
MCH: 31 pg (ref 26.0–34.0)
MCHC: 32.3 g/dL (ref 30.0–36.0)
MCV: 95.9 fL (ref 80.0–100.0)
Platelets: 280 10*3/uL (ref 150–400)
RBC: 2.94 MIL/uL — ABNORMAL LOW (ref 3.87–5.11)
RDW: 15.4 % (ref 11.5–15.5)
WBC: 6.4 10*3/uL (ref 4.0–10.5)
nRBC: 0 % (ref 0.0–0.2)

## 2020-05-08 LAB — COMPREHENSIVE METABOLIC PANEL
ALT: 25 U/L (ref 0–44)
AST: 24 U/L (ref 15–41)
Albumin: 4.1 g/dL (ref 3.5–5.0)
Alkaline Phosphatase: 91 U/L (ref 38–126)
Anion gap: 9 (ref 5–15)
BUN: 80 mg/dL — ABNORMAL HIGH (ref 8–23)
CO2: 23 mmol/L (ref 22–32)
Calcium: 8.7 mg/dL — ABNORMAL LOW (ref 8.9–10.3)
Chloride: 104 mmol/L (ref 98–111)
Creatinine, Ser: 3.62 mg/dL — ABNORMAL HIGH (ref 0.44–1.00)
GFR, Estimated: 13 mL/min — ABNORMAL LOW (ref >60)
Glucose, Bld: 151 mg/dL — ABNORMAL HIGH (ref 70–99)
Potassium: 4.2 mmol/L (ref 3.5–5.1)
Sodium: 136 mmol/L (ref 135–145)
Total Bilirubin: 1.3 mg/dL — ABNORMAL HIGH (ref 0.3–1.2)
Total Protein: 8 g/dL (ref 6.5–8.1)

## 2020-05-08 LAB — PROTIME-INR
INR: 0.9 (ref 0.8–1.2)
Prothrombin Time: 12.6 seconds (ref 11.4–15.2)

## 2020-05-08 MED ORDER — OMEPRAZOLE 20 MG PO CPDR: 20.0000 mg | DELAYED_RELEASE_CAPSULE | Freq: Two times a day (BID) | ORAL | 0 refills | Status: DC

## 2020-05-08 NOTE — ED Notes (Signed)
Provider declines hemoccult at this time d/t pt's positive hemoccult yesterday at her PCP's.

## 2020-05-08 NOTE — ED Notes (Signed)
Provider at bedside to evaluate.

## 2020-05-08 NOTE — ED Notes (Signed)
Pt to CT at this time.

## 2020-05-08 NOTE — ED Triage Notes (Signed)
Pt presents to the ED for abnormal lab work. She states she was seen for a regular appt at her PCP's office and states her PCP called her this morning and states the "bowel sample" the pt provided "had blood in it." The pt states her "stomach has been growling" however, she denies any real pain. She denies noticing any blood in her stools. She denies any c/p, SOB, n/v/d. Pt reports a known hx of "stage 4 kidney disease" and does not undergo dialysis.

## 2020-05-08 NOTE — ED Provider Notes (Signed)
[facility] DEPT Provider Note   CSN: [id number] Arrival date & time: 05/08/20  1029     History Chief Complaint  Patient presents with  . Abnormal Labwork    Abnormal lab work yesterday at [employer] office.     [name] is a 70 y.o. female.  Patient to started up with a new primary care doctor.  Was just seen yesterday.  Had lab work.  Hemoglobin came back at 8.6.  Patient also known to have a history of chronic kidney disease.  Patient gives a history of melanotic black stool not tarry or maroon no red blood for the past month.  Denies any lightheadedness or feeling like she is got a passout.  Occasionally there is some abdominal discomfort.  But no severe abdominal pain.  Past medical history significant for acute kidney injury diabetes history of diverticulosis.  In addition patient has not had any vomiting of blood.  Again no epigastric or pain radiating to the back.        Past Medical History:  Diagnosis Date  . Acute kidney injury ([place]) 01/08/2015  . Arthritis   . Chronic back pain   . Constipation   . Cough   . Diabetes mellitus, type 2 ([place])   . Diverticulosis   . Fibroid    patient thinks this was the reason for her hysterectomy  . GERD (gastroesophageal reflux disease)   . Heart murmur   . History of sebaceous cyst   . Hyperlipemia   . Hyperplastic colon polyp   . Hypertension   . Hyperthyroidism   . Lumbar radiculopathy   . Shortness of breath 09/13/2013  . Stroke ([place]) 2015?   x4   . Tobacco abuse   . 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 Right flank discomfort 11/28/2019  . Vitamin D toxicity, accidental or unintentional, initial encounter 09/09/2017  . Ataxia 09/09/2017  . Type 2 diabetes mellitus with hyperglycemia ([place]) 09/09/2017  . Chronic kidney disease (CKD)   . Olecranon bursitis of right elbow 03/09/2017  . Thyroid  nodule 06/29/2016  . 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 Vitamin D deficiency 01/12/2016  . Medicare annual wellness visit, initial 01/11/2016  . Lacunar infarct, acute ([place]) 03/23/2015  . Non compliance w medication regimen 01/16/2015  . Anemia 01/08/2015  . Stroke ([place]) 01/08/2015  . Hyperlipemia   . Carotid bruit present 12/13/2012  . Routine health maintenance 10/24/2010  . PERIPHERAL EDEMA 09/10/2009  . ELECTROCARDIOGRAM, ABNORMAL 04/11/2008  . HEART MURMUR, SYSTOLIC 94/76/5465  . SEBACEOUS CYST, NECK 12/13/2006  . Hyperthyroidism 09/27/2006  . Secondary diabetes with peripheral vascular disease ([place]) 09/27/2006  . Hyperlipidemia LDL goal <70 09/27/2006  . TOBACCO ABUSE 09/27/2006  . Essential hypertension 09/27/2006    Past Surgical History:  Procedure Laterality Date  . ABDOMINAL HYSTERECTOMY     --?ovaries remain  . COLONOSCOPY W/ BIOPSIES  09/11/2008  . RETINOPATHY SURGERY Bilateral 2013   Dr. Zigmund Daniel     OB History    Gravida  1   Para  1   Term      Preterm      AB      Living  1     SAB      IAB      Ectopic  Multiple      Live Births              Family History  Problem Relation Age of Onset  . Hypertension Mother   . Sleep apnea Mother   . Diabetes Mother   . Hyperlipidemia Mother   . Lung cancer Father        smoker  . Hypertension Sister   . Diabetes Sister   . Hypertension Brother   . Hypertension Sister   . Diabetes Brother   . Hypertension Brother   . Breast cancer Neg Hx   . Colon cancer Neg Hx   . Esophageal cancer Neg Hx   . Pancreatic cancer Neg Hx   . Liver disease Neg Hx   . Colon polyps Neg Hx     Social History   Tobacco Use  . Smoking status: Former Smoker    Packs/day: 0.50    Years: 35.00    Pack years: 17.50    Types: Cigarettes    Quit date: 07/07/2014    Years  since quitting: 5.8  . Smokeless tobacco: Never Used  Vaping Use  . Vaping Use: Never used  Substance Use Topics  . Alcohol use: No  . Drug use: No    Home Medications Prior to Admission medications   Medication Sig Start Date End Date Taking? Authorizing Provider  omeprazole (PRILOSEC) 20 MG capsule Take 1 capsule (20 mg total) by mouth 2 (two) times daily before a meal. 05/08/20  Yes Fredia Sorrow, MD  acarbose (PRECOSE) 25 MG tablet Take 0.5 tablets (12.5 mg total) by mouth 3 (three) times daily with meals. 02/25/20   Renato Shin, MD  amLODipine (NORVASC) 10 MG tablet TAKE 1 TABLET BY MOUTH EVERY DAY 03/16/20   Marrian Salvage, FNP  atorvastatin (LIPITOR) 80 MG tablet TAKE 1 TABLET BY MOUTH EVERY DAY 03/30/20   Marrian Salvage, FNP  Blood Glucose Monitoring Suppl ([place]) w/Device KIT 1 each by Does not apply route 2 (two) times daily. E11.9    [provider]  bromocriptine (PARLODEL) 2.5 MG tablet Take 0.5 tablets (1.25 mg total) by mouth daily. 02/24/20   Renato Shin, MD  chlorthalidone (HYGROTON) 25 MG tablet Take 25 mg by mouth daily.    [provider]  cloNIDine (CATAPRES) 0.2 MG tablet Take 1 tablet (0.2 mg total) by mouth 3 (three) times daily. 05/21/19   Marrian Salvage, FNP  epoetin alfa-epbx (RETACRIT) 47829 UNIT/ML injection Inject 40,000 Units into the skin every 28 (twenty-eight) days.    [provider]  furosemide (LASIX) 40 MG tablet Take 40 mg by mouth 3 (three) times a week. PRN leg swelling    [provider]  glucose blood (ONETOUCH VERIO) test strip USE AS DIRECTED TWICE DAILY 12/12/19   Marrian Salvage, FNP  metoprolol succinate (TOPROL-XL) 100 MG 24 hr tablet Take 100 mg by mouth daily. Take with or immediately following a meal.    [provider]  minoxidil (LONITEN) 10 MG tablet Take 10 mg by mouth 2 (two) times daily.    [provider]  OneTouch Delica Lancets  56O MISC 1 each by Does not apply route 2 (two) times daily. E11.9    [provider]  pantoprazole (PROTONIX) 40 MG tablet Take 1 tablet (40 mg total) by mouth daily. 06/12/19   Marrian Salvage, FNP  polyethylene glycol (MIRALAX / GLYCOLAX) 17 g packet Take 34-51 g by mouth daily as needed  for moderate constipation.    [provider]  repaglinide (PRANDIN) 2 MG tablet Take 1 tablet (2 mg total) by mouth 2 (two) times daily before a meal. 01/10/20   Renato Shin, MD    Allergies    Semaglutide  Review of Systems   Review of Systems  Constitutional: Negative for chills and fever.  HENT: Negative for rhinorrhea and sore throat.   Eyes: Negative for visual disturbance.  Respiratory: Negative for cough and shortness of breath.   Cardiovascular: Negative for chest pain and leg swelling.  Gastrointestinal: Positive for abdominal pain. Negative for diarrhea, nausea and vomiting.  Genitourinary: Negative for dysuria.  Musculoskeletal: Negative for back pain and neck pain.  Skin: Negative for rash.  Neurological: Negative for dizziness, light-headedness and headaches.  Hematological: Does not bruise/bleed easily.  Psychiatric/Behavioral: Negative for confusion.    Physical Exam Updated Vital Signs BP (!) 145/57 (BP Location: Left Arm)   Pulse (!) 53   Temp 97.9 F (36.6 C) (Oral)   Resp 20   Ht 1.575 m ($Remove'5\' 2"'YLUlBbs$ )   Wt 61.2 kg   SpO2 99%   BMI 24.69 kg/m   Physical Exam Vitals and nursing note reviewed.  Constitutional:      General: She is not in acute distress.    Appearance: Normal appearance. She is well-developed.  HENT:     Head: Normocephalic and atraumatic.  Eyes:     Conjunctiva/sclera: Conjunctivae normal.     Pupils: Pupils are equal, round, and reactive to light.  Cardiovascular:     Rate and Rhythm: Normal rate and regular rhythm.     Heart sounds: No murmur heard.   Pulmonary:     Effort: Pulmonary effort is normal. No respiratory  distress.     Breath sounds: Normal breath sounds.  Abdominal:     General: There is no distension.     Palpations: Abdomen is soft.     Tenderness: There is no abdominal tenderness.  Musculoskeletal:        General: Normal range of motion.     Cervical back: Normal range of motion and neck supple.  Skin:    General: Skin is warm and dry.     Capillary Refill: Capillary refill takes less than 2 seconds.  Neurological:     General: No focal deficit present.     Mental Status: She is alert and oriented to person, place, and time.     Cranial Nerves: No cranial nerve deficit.     Sensory: No sensory deficit.     ED Results / Procedures / Treatments   Labs (all labs ordered are listed, but only abnormal results are displayed) Labs Reviewed  COMPREHENSIVE METABOLIC PANEL - Abnormal; Notable for the following components:      Result Value   Glucose, Bld 151 (*)    BUN 80 (*)    Creatinine, Ser 3.62 (*)    Calcium 8.7 (*)    Total Bilirubin 1.3 (*)    GFR, Estimated 13 (*)    All other components within normal limits  CBC - Abnormal; Notable for the following components:   RBC 2.94 (*)    Hemoglobin 9.1 (*)    HCT 28.2 (*)    All other components within normal limits  PROTIME-INR  POC OCCULT BLOOD, ED  TYPE AND SCREEN    EKG None  Radiology CT Abdomen Pelvis Wo Contrast  Result Date: 05/08/2020 CLINICAL DATA:  Melena, acute generalized abdominal pain. EXAM: CT ABDOMEN AND  PELVIS WITHOUT CONTRAST TECHNIQUE: Multidetector CT imaging of the abdomen and pelvis was performed following the standard protocol without IV contrast. COMPARISON:  November 29, 2019. FINDINGS: Lower chest: Minimal bibasilar subsegmental atelectasis is noted. Hepatobiliary: No focal liver abnormality is seen. No gallstones, gallbladder wall thickening, or biliary dilatation. Pancreas: Unremarkable. No pancreatic ductal dilatation or surrounding inflammatory changes. Spleen: Normal in size without focal  abnormality. Adrenals/Urinary Tract: Adrenal glands appear normal. Right renal subcapsular hematoma is significantly smaller compared to prior exam. No hydronephrosis or renal obstruction is noted. No renal or ureteral calculi are noted. Urinary bladder is unremarkable. Stomach/Bowel: Stomach is within normal limits. Appendix appears normal. No evidence of bowel wall thickening, distention, or inflammatory changes. Stool is noted throughout the colon. Vascular/Lymphatic: Aortic atherosclerosis. No enlarged abdominal or pelvic lymph nodes. Reproductive: Status post hysterectomy. No adnexal masses. Other: No abdominal wall hernia or abnormality. No abdominopelvic ascites. Musculoskeletal: No acute or significant osseous findings. IMPRESSION: Right renal subcapsular hematoma noted on prior exam is significantly smaller currently. No other significant abnormality is noted in the abdomen or pelvis. Aortic Atherosclerosis (ICD10-I70.0). Electronically Signed   By: Marijo Conception M.D.   On: 05/08/2020 14:02    Procedures Procedures }  Medications Ordered in ED Medications - No data to display  ED Course  I have reviewed the triage vital signs and the nursing notes.  Pertinent labs & imaging results that were available during my care of the patient were reviewed by me and considered in my medical decision making (see chart for details).    MDM Rules/Calculators/A&P                          CT scan of the abdomen without any acute findings.  There is a residual hematoma kidney.  But it is improved drastically compared to previous scan.  Do not feel that that has any significant relationship to today's presentation.  Patient definitely showing evidence of melena.  Her stool was heme positive yesterday and heme positive today.  Patient's vital signs are stable not tachycardic not hypotensive.  BUN and creatinine is elevated but this seems to be baseline from looking at the [facility] labs.  Bilirubin up  a little bit at 1.3.  No leukocytosis.  Hemoglobin actually today is 9.1.  No indication for blood transfusion at this time.  Patient's INR is normal at 0.9.  Feel that since his symptoms been ongoing for a month and patient is reasonably stable here and hemoglobin although low is not in indication for transfusion at this time.  The patient can follow-up with gastroenterology and her primary care provider.  Because of the melena may very well represent gastric or peptic ulcer although patient has no symptoms.  Will start patient on Prilosec.  Patient given strict precautions on when to return.  She had a family member with her.  They both understand the importance to return for any red blood per rectum or feeling as if she is got a passout or any abdominal pain.  Final Clinical Impression(s) / ED Diagnoses Final diagnoses:  Gastrointestinal hemorrhage, unspecified gastrointestinal hemorrhage type  Melena    Rx / DC Orders ED Discharge Orders         Ordered    omeprazole (PRILOSEC) 20 MG capsule  2 times daily before meals        05/08/20 1430           Fredia Sorrow, MD 05/08/20  1709  

## 2020-05-08 NOTE — Discharge Instructions (Signed)
CT scan without any acute findings.  Your hemoglobin here today is 9.1.  Clearly by history and today's exam you are having some melena.  Follow-up with gastroenterology information provided above.  Return for any red blood per rectum or feeling as if you are going to pass out.  Follow back up with your primary care doctor to have them follow your hemoglobins.  Take the Prilosec as directed in the meantime.

## 2020-05-08 NOTE — ED Notes (Signed)
Provider notified of elevated BP

## 2020-05-08 NOTE — ED Notes (Signed)
RN aware of high bp.

## 2020-05-08 NOTE — ED Notes (Signed)
Blood work sent and urine sent. Provider aware.

## 2020-05-10 LAB — TYPE AND SCREEN
ABO/RH(D): A POS
Antibody Screen: NEGATIVE

## 2020-05-13 DIAGNOSIS — D500 Iron deficiency anemia secondary to blood loss (chronic): Secondary | ICD-10-CM | POA: Diagnosis not present

## 2020-05-13 DIAGNOSIS — R195 Other fecal abnormalities: Secondary | ICD-10-CM | POA: Diagnosis not present

## 2020-05-15 DIAGNOSIS — K640 First degree hemorrhoids: Secondary | ICD-10-CM | POA: Diagnosis not present

## 2020-05-15 DIAGNOSIS — D500 Iron deficiency anemia secondary to blood loss (chronic): Secondary | ICD-10-CM | POA: Diagnosis not present

## 2020-05-15 DIAGNOSIS — Z538 Procedure and treatment not carried out for other reasons: Secondary | ICD-10-CM | POA: Diagnosis not present

## 2020-05-15 DIAGNOSIS — R195 Other fecal abnormalities: Secondary | ICD-10-CM | POA: Diagnosis not present

## 2020-05-15 DIAGNOSIS — K921 Melena: Secondary | ICD-10-CM | POA: Diagnosis not present

## 2020-05-15 DIAGNOSIS — D62 Acute posthemorrhagic anemia: Secondary | ICD-10-CM | POA: Diagnosis not present

## 2020-05-15 DIAGNOSIS — K648 Other hemorrhoids: Secondary | ICD-10-CM | POA: Diagnosis not present

## 2020-05-18 ENCOUNTER — Encounter (HOSPITAL_COMMUNITY): Payer: Self-pay | Admitting: Family Medicine

## 2020-05-18 ENCOUNTER — Emergency Department (HOSPITAL_COMMUNITY): Payer: Medicare Other

## 2020-05-18 ENCOUNTER — Other Ambulatory Visit: Payer: Self-pay

## 2020-05-18 ENCOUNTER — Inpatient Hospital Stay (HOSPITAL_COMMUNITY)
Admission: EM | Admit: 2020-05-18 | Discharge: 2020-05-22 | DRG: 378 | Disposition: A | Discharge status: Home Health | Payer: Medicare Other | Attending: Internal Medicine | Admitting: Internal Medicine

## 2020-05-18 DIAGNOSIS — M1990 Unspecified osteoarthritis, unspecified site: Secondary | ICD-10-CM | POA: Diagnosis present

## 2020-05-18 DIAGNOSIS — I10 Essential (primary) hypertension: Secondary | ICD-10-CM | POA: Diagnosis present

## 2020-05-18 DIAGNOSIS — Z20822 Contact with and (suspected) exposure to covid-19: Secondary | ICD-10-CM | POA: Diagnosis not present

## 2020-05-18 DIAGNOSIS — T380X5A Adverse effect of glucocorticoids and synthetic analogues, initial encounter: Secondary | ICD-10-CM | POA: Diagnosis present

## 2020-05-18 DIAGNOSIS — N179 Acute kidney failure, unspecified: Secondary | ICD-10-CM | POA: Diagnosis not present

## 2020-05-18 DIAGNOSIS — I160 Hypertensive urgency: Secondary | ICD-10-CM | POA: Diagnosis present

## 2020-05-18 DIAGNOSIS — Z83438 Family history of other disorder of lipoprotein metabolism and other lipidemia: Secondary | ICD-10-CM

## 2020-05-18 DIAGNOSIS — M109 Gout, unspecified: Secondary | ICD-10-CM | POA: Diagnosis not present

## 2020-05-18 DIAGNOSIS — I129 Hypertensive chronic kidney disease with stage 1 through stage 4 chronic kidney disease, or unspecified chronic kidney disease: Secondary | ICD-10-CM | POA: Diagnosis present

## 2020-05-18 DIAGNOSIS — Z833 Family history of diabetes mellitus: Secondary | ICD-10-CM

## 2020-05-18 DIAGNOSIS — K922 Gastrointestinal hemorrhage, unspecified: Principal | ICD-10-CM | POA: Diagnosis present

## 2020-05-18 DIAGNOSIS — E872 Acidosis: Secondary | ICD-10-CM | POA: Diagnosis not present

## 2020-05-18 DIAGNOSIS — D631 Anemia in chronic kidney disease: Secondary | ICD-10-CM | POA: Diagnosis present

## 2020-05-18 DIAGNOSIS — D638 Anemia in other chronic diseases classified elsewhere: Secondary | ICD-10-CM | POA: Diagnosis not present

## 2020-05-18 DIAGNOSIS — M25569 Pain in unspecified knee: Secondary | ICD-10-CM

## 2020-05-18 DIAGNOSIS — R195 Other fecal abnormalities: Secondary | ICD-10-CM | POA: Diagnosis not present

## 2020-05-18 DIAGNOSIS — E0590 Thyrotoxicosis, unspecified without thyrotoxic crisis or storm: Secondary | ICD-10-CM | POA: Diagnosis present

## 2020-05-18 DIAGNOSIS — E1165 Type 2 diabetes mellitus with hyperglycemia: Secondary | ICD-10-CM | POA: Diagnosis present

## 2020-05-18 DIAGNOSIS — M25462 Effusion, left knee: Secondary | ICD-10-CM | POA: Diagnosis present

## 2020-05-18 DIAGNOSIS — M1712 Unilateral primary osteoarthritis, left knee: Secondary | ICD-10-CM | POA: Diagnosis not present

## 2020-05-18 DIAGNOSIS — D696 Thrombocytopenia, unspecified: Secondary | ICD-10-CM | POA: Diagnosis present

## 2020-05-18 DIAGNOSIS — E1122 Type 2 diabetes mellitus with diabetic chronic kidney disease: Secondary | ICD-10-CM | POA: Diagnosis present

## 2020-05-18 DIAGNOSIS — Z79899 Other long term (current) drug therapy: Secondary | ICD-10-CM

## 2020-05-18 DIAGNOSIS — R011 Cardiac murmur, unspecified: Secondary | ICD-10-CM | POA: Diagnosis present

## 2020-05-18 DIAGNOSIS — G8929 Other chronic pain: Secondary | ICD-10-CM | POA: Diagnosis not present

## 2020-05-18 DIAGNOSIS — N184 Chronic kidney disease, stage 4 (severe): Secondary | ICD-10-CM | POA: Diagnosis not present

## 2020-05-18 DIAGNOSIS — Z87891 Personal history of nicotine dependence: Secondary | ICD-10-CM

## 2020-05-18 DIAGNOSIS — Z888 Allergy status to other drugs, medicaments and biological substances status: Secondary | ICD-10-CM

## 2020-05-18 DIAGNOSIS — Z7984 Long term (current) use of oral hypoglycemic drugs: Secondary | ICD-10-CM

## 2020-05-18 DIAGNOSIS — K219 Gastro-esophageal reflux disease without esophagitis: Secondary | ICD-10-CM | POA: Diagnosis not present

## 2020-05-18 DIAGNOSIS — E785 Hyperlipidemia, unspecified: Secondary | ICD-10-CM | POA: Diagnosis not present

## 2020-05-18 DIAGNOSIS — D500 Iron deficiency anemia secondary to blood loss (chronic): Secondary | ICD-10-CM

## 2020-05-18 DIAGNOSIS — Z8249 Family history of ischemic heart disease and other diseases of the circulatory system: Secondary | ICD-10-CM

## 2020-05-18 DIAGNOSIS — E875 Hyperkalemia: Secondary | ICD-10-CM | POA: Diagnosis present

## 2020-05-18 DIAGNOSIS — Z8719 Personal history of other diseases of the digestive system: Secondary | ICD-10-CM

## 2020-05-18 DIAGNOSIS — Z8673 Personal history of transient ischemic attack (TIA), and cerebral infarction without residual deficits: Secondary | ICD-10-CM

## 2020-05-18 DIAGNOSIS — I120 Hypertensive chronic kidney disease with stage 5 chronic kidney disease or end stage renal disease: Secondary | ICD-10-CM | POA: Diagnosis not present

## 2020-05-18 DIAGNOSIS — M10062 Idiopathic gout, left knee: Secondary | ICD-10-CM | POA: Diagnosis not present

## 2020-05-18 DIAGNOSIS — D62 Acute posthemorrhagic anemia: Secondary | ICD-10-CM | POA: Diagnosis present

## 2020-05-18 DIAGNOSIS — N183 Chronic kidney disease, stage 3 unspecified: Secondary | ICD-10-CM

## 2020-05-18 DIAGNOSIS — Z90710 Acquired absence of both cervix and uterus: Secondary | ICD-10-CM

## 2020-05-18 DIAGNOSIS — M25562 Pain in left knee: Secondary | ICD-10-CM | POA: Diagnosis not present

## 2020-05-18 DIAGNOSIS — N186 End stage renal disease: Secondary | ICD-10-CM | POA: Diagnosis not present

## 2020-05-18 LAB — BASIC METABOLIC PANEL
Anion gap: 15 (ref 5–15)
BUN: 72 mg/dL — ABNORMAL HIGH (ref 8–23)
CO2: 20 mmol/L — ABNORMAL LOW (ref 22–32)
Calcium: 8.2 mg/dL — ABNORMAL LOW (ref 8.9–10.3)
Chloride: 101 mmol/L (ref 98–111)
Creatinine, Ser: 3.78 mg/dL — ABNORMAL HIGH (ref 0.44–1.00)
GFR, Estimated: 12 mL/min — ABNORMAL LOW (ref >60)
Glucose, Bld: 169 mg/dL — ABNORMAL HIGH (ref 70–99)
Potassium: 4.5 mmol/L (ref 3.5–5.1)
Sodium: 136 mmol/L (ref 135–145)

## 2020-05-18 LAB — CBC WITH DIFFERENTIAL/PLATELET
Abs Immature Granulocytes: 0.04 10*3/uL (ref 0.00–0.07)
Basophils Absolute: 0 10*3/uL (ref 0.0–0.1)
Basophils Relative: 0 %
Eosinophils Absolute: 0 10*3/uL (ref 0.0–0.5)
Eosinophils Relative: 0 %
HCT: 21.4 % — ABNORMAL LOW (ref 36.0–46.0)
Hemoglobin: 6.6 g/dL — CL (ref 12.0–15.0)
Immature Granulocytes: 1 %
Lymphocytes Relative: 14 %
Lymphs Abs: 1 10*3/uL (ref 0.7–4.0)
MCH: 30.7 pg (ref 26.0–34.0)
MCHC: 30.8 g/dL (ref 30.0–36.0)
MCV: 99.5 fL (ref 80.0–100.0)
Monocytes Absolute: 1 10*3/uL (ref 0.1–1.0)
Monocytes Relative: 15 %
Neutro Abs: 5 10*3/uL (ref 1.7–7.7)
Neutrophils Relative %: 70 %
Platelets: 154 10*3/uL (ref 150–400)
RBC: 2.15 MIL/uL — ABNORMAL LOW (ref 3.87–5.11)
RDW: 15.2 % (ref 11.5–15.5)
WBC: 7 10*3/uL (ref 4.0–10.5)
nRBC: 0 % (ref 0.0–0.2)

## 2020-05-18 LAB — SYNOVIAL CELL COUNT + DIFF, W/ CRYSTALS
Eosinophils-Synovial: 0 % (ref 0–1)
Lymphocytes-Synovial Fld: 0 % (ref 0–20)
Monocyte-Macrophage-Synovial Fluid: 5 % — ABNORMAL LOW (ref 50–90)
Neutrophil, Synovial: 95 % — ABNORMAL HIGH (ref 0–25)
WBC, Synovial: 6900 /mm3 — ABNORMAL HIGH (ref 0–200)

## 2020-05-18 LAB — POC OCCULT BLOOD, ED: Fecal Occult Bld: POSITIVE — AB

## 2020-05-18 LAB — PREPARE RBC (CROSSMATCH)

## 2020-05-18 LAB — LACTIC ACID, PLASMA: Lactic Acid, Venous: 1.2 mmol/L (ref 0.5–1.9)

## 2020-05-18 MED ORDER — LIDOCAINE-EPINEPHRINE (PF) 2 %-1:200000 IJ SOLN
10.0000 mL | Freq: Once | INTRAMUSCULAR | Status: AC
  Administered 2020-05-18: 10 mL
  Filled 2020-05-18: qty 20

## 2020-05-18 MED ORDER — ACETAMINOPHEN 325 MG PO TABS
650.0000 mg | ORAL_TABLET | Freq: Once | ORAL | Status: AC
  Administered 2020-05-18: 650 mg via ORAL
  Filled 2020-05-18: qty 2

## 2020-05-18 MED ORDER — ONDANSETRON HCL 4 MG/2ML IJ SOLN
4.0000 mg | Freq: Once | INTRAMUSCULAR | Status: AC
  Administered 2020-05-18: 4 mg via INTRAVENOUS
  Filled 2020-05-18: qty 2

## 2020-05-18 MED ORDER — SODIUM CHLORIDE 0.9 % IV SOLN
10.0000 mL/h | Freq: Once | INTRAVENOUS | Status: AC
  Administered 2020-05-19: 10 mL/h via INTRAVENOUS

## 2020-05-18 MED ORDER — FENTANYL CITRATE (PF) 100 MCG/2ML IJ SOLN
50.0000 ug | Freq: Once | INTRAMUSCULAR | Status: AC
Start: 2020-05-18 — End: 2020-05-18
  Administered 2020-05-18: 50 ug via INTRAVENOUS
  Filled 2020-05-18: qty 2

## 2020-05-18 NOTE — ED Provider Notes (Signed)
[facility] EMERGENCY DEPARTMENT Provider Note   CSN: [id number] Arrival date & time: 05/18/20  1629     History Chief Complaint  Patient presents with  . Knee Pain    [name] is a 70 y.o. female.  Patient with history of stroke, diabetes, anemia, recent heme positive stools followed up by GI visit and colonoscopy, unfortunately the prep was not sufficient and patient was rescheduled --presents the emergency department for 3 days of worsening left knee pain and swelling.  Pain is worse with movement and has been causing difficulty walking.  Patient has never had any knee problems or gout.  She denies fever however temperature is 100 F on arrival to the ED today.  No associated nausea or vomiting.  Patient continues to report black stools.  Hemoglobin found to be 6.6 on triage labs today.  Patient states that she has received a blood transfusion in the past but cannot tell me what caused her blood counts to be low (records show perinephric hematoma 11/2019, hgb into 5's).  No nausea, vomiting, or diarrhea.  No hematuria or vaginal bleeding reported.  No anticoagulation.        Past Medical History:  Diagnosis Date  . Acute kidney injury ([place]) 01/08/2015  . Arthritis   . Chronic back pain   . Constipation   . Cough   . Diabetes mellitus, type 2 ([place])   . Diverticulosis   . Fibroid    patient thinks this was the reason for her hysterectomy  . GERD (gastroesophageal reflux disease)   . Heart murmur   . History of sebaceous cyst   . Hyperlipemia   . Hyperplastic colon polyp   . Hypertension   . Hyperthyroidism   . Lumbar radiculopathy   . Shortness of breath 09/13/2013  . Stroke ([place]) 2015?   x4   . Tobacco abuse   . 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 Right flank discomfort 11/28/2019  . Vitamin D toxicity, accidental or unintentional, initial  encounter 09/09/2017  . Ataxia 09/09/2017  . Type 2 diabetes mellitus with hyperglycemia ([place]) 09/09/2017  . Chronic kidney disease (CKD)   . Olecranon bursitis of right elbow 03/09/2017  . Thyroid nodule 06/29/2016  . 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 Vitamin D deficiency 01/12/2016  . Medicare annual wellness visit, initial 01/11/2016  . Lacunar infarct, acute ([place]) 03/23/2015  . Non compliance w medication regimen 01/16/2015  . Anemia 01/08/2015  . Stroke ([place]) 01/08/2015  . Hyperlipemia   . Carotid bruit present 12/13/2012  . Routine health maintenance 10/24/2010  . PERIPHERAL EDEMA 09/10/2009  . ELECTROCARDIOGRAM, ABNORMAL 04/11/2008  . HEART MURMUR, SYSTOLIC 67/61/9509  . SEBACEOUS CYST, NECK 12/13/2006  . Hyperthyroidism 09/27/2006  . Secondary diabetes with peripheral vascular disease ([place]) 09/27/2006  . Hyperlipidemia LDL goal <70 09/27/2006  . TOBACCO ABUSE 09/27/2006  . Essential hypertension 09/27/2006    Past Surgical History:  Procedure Laterality Date  . ABDOMINAL HYSTERECTOMY     --?ovaries remain  . COLONOSCOPY W/ BIOPSIES  09/11/2008  . RETINOPATHY SURGERY Bilateral 2013   Dr. Zigmund Daniel     OB History    Gravida  1   Para  1   Term      Preterm      AB  Living  1     SAB      IAB      Ectopic      Multiple      Live Births              Family History  Problem Relation Age of Onset  . Hypertension Mother   . Sleep apnea Mother   . Diabetes Mother   . Hyperlipidemia Mother   . Lung cancer Father        smoker  . Hypertension Sister   . Diabetes Sister   . Hypertension Brother   . Hypertension Sister   . Diabetes Brother   . Hypertension Brother   . Breast cancer Neg Hx   . Colon cancer Neg Hx   . Esophageal cancer Neg Hx   . Pancreatic cancer Neg Hx   . Liver disease Neg Hx   . Colon  polyps Neg Hx     Social History   Tobacco Use  . Smoking status: Former Smoker    Packs/day: 0.50    Years: 35.00    Pack years: 17.50    Types: Cigarettes    Quit date: 07/07/2014    Years since quitting: 5.8  . Smokeless tobacco: Never Used  Vaping Use  . Vaping Use: Never used  Substance Use Topics  . Alcohol use: No  . Drug use: No    Home Medications Prior to Admission medications   Medication Sig Start Date End Date Taking? Authorizing Provider  acarbose (PRECOSE) 25 MG tablet Take 0.5 tablets (12.5 mg total) by mouth 3 (three) times daily with meals. 02/25/20   Renato Shin, MD  amLODipine (NORVASC) 10 MG tablet TAKE 1 TABLET BY MOUTH EVERY DAY 03/16/20   Marrian Salvage, FNP  atorvastatin (LIPITOR) 80 MG tablet TAKE 1 TABLET BY MOUTH EVERY DAY 03/30/20   Marrian Salvage, FNP  Blood Glucose Monitoring Suppl ([place]) w/Device KIT 1 each by Does not apply route 2 (two) times daily. E11.9    [provider]  bromocriptine (PARLODEL) 2.5 MG tablet Take 0.5 tablets (1.25 mg total) by mouth daily. 02/24/20   Renato Shin, MD  chlorthalidone (HYGROTON) 25 MG tablet Take 25 mg by mouth daily.    [provider]  cloNIDine (CATAPRES) 0.2 MG tablet Take 1 tablet (0.2 mg total) by mouth 3 (three) times daily. 05/21/19   Marrian Salvage, FNP  epoetin alfa-epbx (RETACRIT) 46568 UNIT/ML injection Inject 40,000 Units into the skin every 28 (twenty-eight) days.    [provider]  furosemide (LASIX) 40 MG tablet Take 40 mg by mouth 3 (three) times a week. PRN leg swelling    [provider]  glucose blood (ONETOUCH VERIO) test strip USE AS DIRECTED TWICE DAILY 12/12/19   Marrian Salvage, FNP  metoprolol succinate (TOPROL-XL) 100 MG 24 hr tablet Take 100 mg by mouth daily. Take with or immediately following a meal.    [provider]  minoxidil (LONITEN) 10 MG tablet Take 10 mg by mouth 2 (two) times  daily.    [provider]  omeprazole (PRILOSEC) 20 MG capsule Take 1 capsule (20 mg total) by mouth 2 (two) times daily before a meal. 05/08/20   Fredia Sorrow, MD  OneTouch Delica Lancets 12X MISC 1 each by Does not apply route 2 (two) times daily. E11.9    [provider]  pantoprazole (PROTONIX) 40 MG tablet Take 1 tablet (40 mg total) by mouth  daily. 06/12/19   Marrian Salvage, FNP  polyethylene glycol (MIRALAX / GLYCOLAX) 17 g packet Take 34-51 g by mouth daily as needed for moderate constipation.    [provider]  repaglinide (PRANDIN) 2 MG tablet Take 1 tablet (2 mg total) by mouth 2 (two) times daily before a meal. 01/10/20   Renato Shin, MD    Allergies    Semaglutide  Review of Systems   Review of Systems  Constitutional: Positive for fatigue. Negative for chills and fever.  HENT: Negative for rhinorrhea and sore throat.   Eyes: Negative for redness.  Respiratory: Positive for shortness of breath (mild, exertion). Negative for cough.   Cardiovascular: Negative for chest pain.  Gastrointestinal: Positive for blood in stool. Negative for abdominal pain, diarrhea, nausea and vomiting.  Genitourinary: Negative for dysuria, frequency, hematuria and urgency.  Musculoskeletal: Positive for arthralgias and joint swelling. Negative for myalgias.  Skin: Negative for rash.  Neurological: Negative for headaches.    Physical Exam Updated Vital Signs BP (!) 142/48   Pulse 63   Temp 100 F (37.8 C) (Oral)   Resp (!) 25   SpO2 98%   Physical Exam Vitals and nursing note reviewed. Exam conducted with a chaperone present.  Constitutional:      General: She is not in acute distress.    Appearance: She is well-developed.  HENT:     Head: Normocephalic and atraumatic.     Right Ear: External ear normal.     Left Ear: External ear normal.     Nose: Nose normal.  Eyes:     Conjunctiva/sclera: Conjunctivae normal.  Cardiovascular:     Rate and  Rhythm: Normal rate and regular rhythm.     Heart sounds: No murmur heard.   Pulmonary:     Effort: No respiratory distress.     Breath sounds: No wheezing, rhonchi or rales.  Abdominal:     Palpations: Abdomen is soft.     Tenderness: There is no abdominal tenderness. There is no guarding or rebound.  Genitourinary:    Rectum: Guaiac result positive. No tenderness, external hemorrhoid or internal hemorrhoid.     Comments: Scant amount of stool, not obviously melenotic. There were a few flecks of red. Heme+.  Musculoskeletal:     Cervical back: Normal range of motion and neck supple.     Left knee: Effusion present. Decreased range of motion. Tenderness present.     Right lower leg: No edema.     Left lower leg: No swelling. No edema.     Comments: No overlying erythema of left knee, but mild to moderate effusion with exquisite tenderness.  Skin:    General: Skin is warm and dry.     Findings: No rash.  Neurological:     General: No focal deficit present.     Mental Status: She is alert. Mental status is at baseline.     Motor: No weakness.  Psychiatric:        Mood and Affect: Mood normal.     ED Results / Procedures / Treatments   Labs (all labs ordered are listed, but only abnormal results are displayed) Labs Reviewed  CBC WITH DIFFERENTIAL/PLATELET - Abnormal; Notable for the following components:      Result Value   RBC 2.15 (*)    Hemoglobin 6.6 (*)    HCT 21.4 (*)    All other components within normal limits  BASIC METABOLIC PANEL - Abnormal; Notable for the following components:  CO2 20 (*)    Glucose, Bld 169 (*)    BUN 72 (*)    Creatinine, Ser 3.78 (*)    Calcium 8.2 (*)    GFR, Estimated 12 (*)    All other components within normal limits  SYNOVIAL CELL COUNT + DIFF, W/ CRYSTALS - Abnormal; Notable for the following components:   Color, Synovial RED (*)    Appearance-Synovial TURBID (*)    WBC, Synovial 6,900 (*)    Neutrophil, Synovial 95 (*)     Monocyte-Macrophage-Synovial Fluid 5 (*)    All other components within normal limits  POC OCCULT BLOOD, ED - Abnormal; Notable for the following components:   Fecal Occult Bld POSITIVE (*)    All other components within normal limits  CULTURE, BLOOD (ROUTINE X 2)  BODY FLUID CULTURE W GRAM STAIN  RESP PANEL BY RT-PCR (FLU A&B, COVID) ARPGX2  LACTIC ACID, PLASMA  C-REACTIVE PROTEIN  SEDIMENTATION RATE  GLUCOSE, BODY FLUID OTHER  PROTEIN, BODY FLUID (OTHER)  URIC ACID  TYPE AND SCREEN  PREPARE RBC (CROSSMATCH)    EKG None  Radiology DG Knee Complete 4 Views Left  Result Date: 05/18/2020 CLINICAL DATA:  Knee pain, no known trauma EXAM: LEFT KNEE - COMPLETE 4+ VIEW COMPARISON:  December 01, 2019 FINDINGS: No evidence of fracture or dislocation. Small joint effusion. 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 Superior patellar pole again visualized. Vascular calcifications. IMPRESSION: No acute osseous abnormality visualized. 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 Small joint effusion. Electronically Signed   By: Dahlia Bailiff MD   On: 05/18/2020 17:04    Procedures .Joint Aspiration/Arthrocentesis  Date/Time: 05/18/2020 9:14 PM Performed by: Carlisle Cater, PA-C Authorized by: Carlisle Cater, PA-C   Consent:    Consent obtained:  Verbal   Consent given by:  Patient   Risks, benefits, and alternatives were discussed: yes     Risks discussed:  Bleeding, infection, pain and incomplete drainage   Alternatives discussed:  No treatment Universal protocol:    Procedure explained and questions answered to patient or proxy's satisfaction: yes     Imaging studies available: yes     Patient identity confirmed:  Verbally with patient, arm band and provided demographic data Location:    Location:  Knee   Knee:  L knee Anesthesia:    Anesthesia method:  Local infiltration   Local anesthetic:  Lidocaine 2% WITH  epi Procedure details:    Preparation: Patient was prepped and draped in usual sterile fashion     Needle gauge:  18 G   Ultrasound guidance: no     Approach:  Medial   Aspirate amount:  32cc   Aspirate characteristics:  Bloody   Steroid injected: no     Specimen collected: yes   Post-procedure details:    Dressing:  Adhesive bandage   Procedure completion:  Tolerated well, no immediate complications     Medications Ordered in ED Medications  0.9 %  sodium chloride infusion (has no administration in time range)  acetaminophen (TYLENOL) tablet 650 mg (650 mg Oral Given 05/18/20 1645)  lidocaine-EPINEPHrine (XYLOCAINE W/EPI) 2 %-1:200000 (PF) injection 10 mL (10 mLs Infiltration Given 05/18/20 2043)  fentaNYL (SUBLIMAZE) injection 50 mcg (50 mcg Intravenous Given 05/18/20 2044)  ondansetron (ZOFRAN) injection 4 mg (4 mg Intravenous Given 05/18/20 2043)    ED Course  I have reviewed the triage vital signs and the nursing notes.  Pertinent labs & imaging results that were  available during my care of the patient were reviewed by me and considered in my medical decision making (see chart for details).  Patient seen and examined. Work-up initiated.   Vital signs reviewed and are as follows: BP (!) 154/58   Pulse 62   Temp 100 F (37.8 C) (Oral)   Resp 20   SpO2 96%   9:13 PM Discussed risks and benefits of joint aspiration with patient. She agrees to proceed. She tolerated without difficulty. Joint aspirate bloody suggesting hemarthrosis.   10:36 PM Joint aspirate suggestive of gout. Will admit for GI bleeding.   11:12 PM Pt updated. Blood ordered. Discussed case with Dr. Tonie Griffith who will see.   CRITICAL CARE Performed by: Carlisle Cater PA-C Total critical care time: 40 minutes Critical care time was exclusive of separately billable procedures and treating other patients. Critical care was necessary to treat or prevent imminent or life-threatening deterioration. Critical care  was time spent personally by me on the following activities: development of treatment plan with patient and/or surrogate as well as nursing, discussions with consultants, evaluation of patient's response to treatment, examination of patient, obtaining history from patient or surrogate, ordering and performing treatments and interventions, ordering and review of laboratory studies, ordering and review of radiographic studies, pulse oximetry and re-evaluation of patient's condition.     MDM Rules/Calculators/A&P                          Admit.   Final Clinical Impression(s) / ED Diagnoses Final diagnoses:  Gastrointestinal hemorrhage, unspecified gastrointestinal hemorrhage type  Blood loss anemia  Acute gout of left knee, unspecified cause    Rx / DC Orders ED Discharge Orders    None       Carlisle Cater, Hershal Coria 05/18/20 2313    Luna Fuse, MD 05/18/20 2315

## 2020-05-18 NOTE — ED Triage Notes (Signed)
Pt. Stated, I woke up before the weekend and my left knee hurting and unable to walk

## 2020-05-18 NOTE — ED Notes (Signed)
Pt. Stated, I have had nothing for the knee pain

## 2020-05-18 NOTE — ED Triage Notes (Signed)
Emergency Medicine Provider Triage Evaluation Note  [name] , a 70 y.o. female  was evaluated in triage.  Pt complains of left knee pain that started over the weekend associated with edema and warmth. No injury. Denies fever and chills. No previous knee injury/replacement. No history of gout  Review of Systems  Positive: arthralgia Negative: Chest pain  Physical Exam  BP (!) 141/52 (BP Location: Right Arm)   Pulse 67   Temp 100 F (37.8 C) (Oral)   Resp 18   SpO2 100%  Gen:   Awake, no distress   HEENT:  Atraumatic  Resp:  Normal effort  Cardiac:  Normal rate  Abd:   Nondistended, nontender  MSK:   Left knee edematous and warm with decreased ROM Neuro:  Speech clear   Medical Decision Making  Medically screening exam initiated at 4:39 PM.  Appropriate orders placed.  LUCILLA PETRENKO was informed that the remainder of the evaluation will be completed by another provider, this initial triage assessment does not replace that evaluation, and the importance of remaining in the ED until their evaluation is complete.  Clinical Impression  Edematous and warm left knee. Borderline febrile. Concern for possible infection. Routine labs and x-ray ordered.   Suzy Bouchard, PA-C 05/18/20 1640

## 2020-05-18 NOTE — H&P (Signed)
History and Physical    JIANNI SHELDEN FIE:[id number] DOB: 26-Aug-1950 DOA: 05/18/2020  PCP: System, Provider Not In   Patient coming from: Home  Chief Complaint: Left knee pain, fatigue  HPI: NOELLE SEASE is a 70 y.o. female with medical history significant for HTN, DMT2, CKD 3, anemia with history of GI bleeding who presents for evaluation of left knee pain and swelling and fatigue.  She reports that she is having swelling and pain of her left knee for the last 3 days is progressed to the point where she has difficulty walking.  She has no known injury or trauma to her knee.  She states she does not have a history of gout.  She reports she has been more fatigued over the last week and has increasing shortness of breath with exertion.  Shortness of breath improves if she sits and rest.  She reports she has black dark stools as well as occasionally maroon blood in her stool.  A few weeks ago she was admitted to [facility] for GI bleed and had a colonoscopy although bowel prep was insufficient and patient was then be scheduled for repeat colonoscopy in the next few weeks she states.  Today she is found have a hemoglobin of 6.6.  He has been afebrile.  ED Course:   Ms. Livas has been hemodynamically stable in the emergency room.  ER PA performed an aspiration of the left knee and fluid showed some white blood cells but less than would be indicative of septic arthritis.  Patient also has urate monosodium crystals.  CRP, sed rate and uric acid levels have been ordered and are pending.  Hemoglobin is 6.6.  Review of Systems:  General: Reports fatigue and weakness. Denies fever, chills, weight loss, night sweats.  Denies dizziness.  Denies change in appetite HENT: Denies head trauma, headache, denies change in hearing, tinnitus.  Denies nasal congestion or bleeding.  Denies sore throat, sores in mouth.  Denies difficulty swallowing Eyes: Denies blurry vision, pain in eye, drainage.   Denies discoloration of eyes. Neck: Denies pain.  Denies swelling.  Denies pain with movement. Cardiovascular: Denies chest pain, palpitations.  Denies edema.  Denies orthopnea Respiratory: Reports shortness of breath with exertion. Denies cough.  Denies wheezing.  Denies sputum production Gastrointestinal: Reports blood in stool. Denies abdominal pain, swelling.  Denies nausea, vomiting, diarrhea. Denies hematemesis. Musculoskeletal: Reports left knee pain and limitation of movement.  Genitourinary: Denies pelvic pain.  Denies urinary frequency or hesitancy.  Denies dysuria.  Skin: Denies rash.  Denies petechiae, purpura, ecchymosis. Neurological: Denies headache.  Denies syncope.  Denies seizure activity.  Denies weakness or paresthesia.  Denies slurred speech, drooping face.  Denies visual change. Psychiatric: Denies depression, anxiety. Denies hallucinations.  Past Medical History:  Diagnosis Date  . Acute kidney injury ([place]) 01/08/2015  . Arthritis   . Chronic back pain   . Constipation   . Cough   . Diabetes mellitus, type 2 ([place])   . Diverticulosis   . Fibroid    patient thinks this was the reason for her hysterectomy  . GERD (gastroesophageal reflux disease)   . Heart murmur   . History of sebaceous cyst   . Hyperlipemia   . Hyperplastic colon polyp   . Hypertension   . Hyperthyroidism   . Lumbar radiculopathy   . Shortness of breath 09/13/2013  . Stroke ([place]) 2015?   x4   . Tobacco abuse   . 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 ABDOMINAL HYSTERECTOMY     --?ovaries remain  . COLONOSCOPY W/ BIOPSIES  09/11/2008  . RETINOPATHY SURGERY Bilateral 2013   Dr. Zigmund Daniel    Social History  reports that she quit smoking about 5 years ago. Her smoking use included cigarettes. She has a 17.50 pack-year smoking history. She has never used smokeless tobacco. She reports that she does not  drink alcohol and does not use drugs.  Allergies  Allergen Reactions  . Semaglutide Rash    Rybelsus 3 mg    Family History  Problem Relation Age of Onset  . Hypertension Mother   . Sleep apnea Mother   . Diabetes Mother   . Hyperlipidemia Mother   . Lung cancer Father        smoker  . Hypertension Sister   . Diabetes Sister   . Hypertension Brother   . Hypertension Sister   . Diabetes Brother   . Hypertension Brother   . Breast cancer Neg Hx   . Colon cancer Neg Hx   . Esophageal cancer Neg Hx   . Pancreatic cancer Neg Hx   . Liver disease Neg Hx   . Colon polyps Neg Hx      Prior to Admission medications   Medication Sig Start Date End Date Taking? Authorizing Provider  acarbose (PRECOSE) 25 MG tablet Take 0.5 tablets (12.5 mg total) by mouth 3 (three) times daily with meals. 02/25/20   Renato Shin, MD  amLODipine (NORVASC) 10 MG tablet TAKE 1 TABLET BY MOUTH EVERY DAY 03/16/20   Marrian Salvage, FNP  atorvastatin (LIPITOR) 80 MG tablet TAKE 1 TABLET BY MOUTH EVERY DAY 03/30/20   Marrian Salvage, FNP  Blood Glucose Monitoring Suppl ([place]) w/Device KIT 1 each by Does not apply route 2 (two) times daily. E11.9    [provider]  bromocriptine (PARLODEL) 2.5 MG tablet Take 0.5 tablets (1.25 mg total) by mouth daily. 02/24/20   Renato Shin, MD  chlorthalidone (HYGROTON) 25 MG tablet Take 25 mg by mouth daily.    [provider]  cloNIDine (CATAPRES) 0.2 MG tablet Take 1 tablet (0.2 mg total) by mouth 3 (three) times daily. 05/21/19   Marrian Salvage, FNP  epoetin alfa-epbx (RETACRIT) 27062 UNIT/ML injection Inject 40,000 Units into the skin every 28 (twenty-eight) days.    [provider]  furosemide (LASIX) 40 MG tablet Take 40 mg by mouth 3 (three) times a week. PRN leg swelling    [provider]  glucose blood (ONETOUCH VERIO) test strip USE AS DIRECTED TWICE DAILY 12/12/19   Marrian Salvage, FNP  metoprolol succinate (TOPROL-XL) 100 MG 24 hr tablet Take 100 mg by mouth daily. Take with or immediately following a meal.    [provider]  minoxidil (LONITEN) 10 MG tablet Take 10 mg by mouth 2 (two) times daily.    [provider]  omeprazole (PRILOSEC) 20 MG capsule Take 1 capsule (20 mg total) by mouth 2 (two) times daily before a meal. 05/08/20   Fredia Sorrow, MD  OneTouch Delica Lancets 37S MISC 1 each by Does not apply route 2 (two) times daily. E11.9    [provider]  pantoprazole (PROTONIX) 40 MG tablet Take 1 tablet (40 mg total) by mouth daily. 06/12/19   Marrian Salvage, FNP  polyethylene glycol (MIRALAX / GLYCOLAX) 17 g  packet Take 34-51 g by mouth daily as needed for moderate constipation.    [provider]  repaglinide (PRANDIN) 2 MG tablet Take 1 tablet (2 mg total) by mouth 2 (two) times daily before a meal. 01/10/20   Renato Shin, MD    Physical Exam: Vitals:   05/18/20 2245 05/18/20 2300 05/18/20 2348 05/18/20 2350  BP: (!) 126/45 (!) 106/51 (!) 114/46 (!) 114/46  Pulse: (!) 57 (!) 56 (!) 55 (!) 55  Resp: _0 Temp:   98.7 F (37.1 C) 98.7 F (37.1 C)  TempSrc:   Oral Oral  SpO2: 93% 92% 93% 93%    Constitutional: NAD, calm, comfortable Vitals:   05/18/20 2245 05/18/20 2300 05/18/20 2348 05/18/20 2350  BP: (!) 126/45 (!) 106/51 (!) 114/46 (!) 114/46  Pulse: (!) 57 (!) 56 (!) 55 (!) 55  Resp: _1 Temp:   98.7 F (37.1 C) 98.7 F (37.1 C)  TempSrc:   Oral Oral  SpO2: 93% 92% 93% 93%   General: WDWN, Alert and oriented x3.  Eyes: EOMI, PERRL, conjunctivae pale pink.  Sclera nonicteric HENT:  [place]/AT, external ears normal.  Nares patent without epistasis.  Mucous membranes are moist. Neck: Soft, normal range of motion, supple, no masses, no thyromegaly.  Trachea midline Respiratory: clear to auscultation bilaterally, no wheezing, no crackles. Normal respiratory effort. No  accessory muscle use.  Cardiovascular: Regular rate and rhythm, no murmurs / rubs / gallops. No extremity edema. 2+ pedal pulses. Abdomen: Soft, no tenderness, nondistended, no rebound or guarding. No masses palpated. Bowel sounds normoactive. Hemoccult positive stool.  Musculoskeletal: LROM of left knee due to pain. Left knee swollen and tender to palpation. FROM of right lower extremity and bilateral upper extremities. no cyanosis. Normal muscle tone.  Skin: Warm, dry, intact no rashes, lesions, ulcers. No induration Neurologic: CN 2-12 grossly intact. Normal speech. Sensation intact. Strength 5/5 in all extremities.   Psychiatric: Normal judgment and insight.  Normal mood.    Labs on Admission: I have personally reviewed following labs and imaging studies  CBC: Recent Labs  Lab 05/18/20 1639  WBC 7.0  NEUTROABS 5.0  HGB 6.6*  HCT 21.4*  MCV 99.5  PLT 034    Basic Metabolic Panel: Recent Labs  Lab 05/18/20 1639  NA 136  K 4.5  CL 101  CO2 20*  GLUCOSE 169*  BUN 72*  CREATININE 3.78*  CALCIUM 8.2*    GFR: Estimated Creatinine Clearance: 11.9 mL/min (A) (by C-G formula based on SCr of 3.78 mg/dL (H)).  Liver Function Tests: No results for input(s): AST, ALT, ALKPHOS, BILITOT, PROT, ALBUMIN in the last 168 hours.  Urine analysis:    Component Value Date/Time   COLORURINE YELLOW 11/27/2019 2136   APPEARANCEUR HAZY (A) 11/27/2019 2136   LABSPEC 1.011 11/27/2019 2136   PHURINE 6.0 11/27/2019 2136   GLUCOSEU 150 (A) 11/27/2019 2136   GLUCOSEU NEGATIVE 09/04/2009 0721   HGBUR SMALL (A) 11/27/2019 2136   BILIRUBINUR NEGATIVE 11/27/2019 2136   BILIRUBINUR negative 09/24/2019 1003   KETONESUR NEGATIVE 11/27/2019 2136   PROTEINUR >=300 (A) 11/27/2019 2136   UROBILINOGEN 0.2 09/24/2019 1003   UROBILINOGEN 0.2 09/04/2009 0721   NITRITE NEGATIVE 11/27/2019 2136   LEUKOCYTESUR NEGATIVE 11/27/2019 2136    Radiological Exams on Admission: DG Knee Complete 4 Views  Left  Result Date: 05/18/2020 CLINICAL DATA:  Knee pain, no known trauma EXAM: LEFT KNEE - COMPLETE 4+ VIEW COMPARISON:  December 01, 2019 FINDINGS: No evidence of fracture or dislocation. Small joint effusion. 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 Superior patellar pole again visualized. Vascular calcifications. IMPRESSION: No acute osseous abnormality visualized. 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 Small joint effusion. Electronically Signed   By: Dahlia Bailiff MD   On: 05/18/2020 17:04    Assessment/Plan Principal Problem:   GI bleed Ms. Eley is admitted to Med/Surg floor.   GI consulted-secure chat sent.  Transfuse two units of PRBC.  NPO until Gi evaluation.  Monitor Hgb/Hct after transfusion.   Active Problems:   Essential hypertension Continue home medications. Monitor BP.    Type 2 diabetes mellitus with hyperglycemia  Continue home medications. Monitor blood sugar q 6 hr. SSI provided as needed for glycemic control.  Pt had recent HgbA1c so does not need repeated at this time.    CKD (chronic kidney disease) stage 3 A Stable.  Gentle IVF hydration    Knee pain Fluid aspirated in ER and does not appear to be septic fluid. Crystals noted in fluid.  Uric acid, CRP and sed rate pending.  Pain control provided.     DVT prophylaxis: SCDs for DVT prophylaxis. No anticoagulation in setting of GI bleeding.   Code Status:   Full Code  Family Communication:  Diagnosis and plan discussed with patient.  Patient verbalized understanding agrees with plan.  Further recommendations to follow as clinically indicated Disposition Plan:   Patient is from:  Home  Anticipated DC to:  Home  Anticipated DC date:  Anticipate greater than 2 midnight stay in the hospital  Anticipated DC barriers: No barriers to discharge identified at this time  Consults called:  Gastroenterology-secure chat sent to Dr.  Cindee Lame Admission status:  Inpatient   Yevonne Aline Dewie Ahart MD [facility]  How to contact the [facility] Attending or Consulting provider [place] or covering provider during after hours [place], for this patient?   1. Check the care team in [facility] and look for a) attending/consulting TRH provider listed and b) the [facility] team listed 2. Log into [email or web address] and use [place]'s universal password to access. If you do not have the password, please contact the hospital operator. 3. Locate the [facility] provider you are looking for under [facility] and page to a number that you can be directly reached. 4. If you still have difficulty reaching the provider, please page the [facility] (Director on Call) for the Hospitalists listed on amion for assistance.  05/18/2020, 11:59 PM

## 2020-05-18 NOTE — H&P (Incomplete)
History and Physical    [name] HWE:[id number] DOB: 12-18-1950 DOA: 05/18/2020  PCP: System, Provider Not In   Patient coming from: ***  Chief Complaint: ***  HPI: [name] is a 70 y.o. female with medical history significant for***   ED Course: ***  Review of Systems:  General: Denies weakness, fever, chills, weight loss, night sweats.  Denies dizziness.  Denies change in appetite HENT: Denies head trauma, headache, denies change in hearing, tinnitus.  Denies nasal congestion or bleeding.  Denies sore throat, sores in mouth.  Denies difficulty swallowing Eyes: Denies blurry vision, pain in eye, drainage.  Denies discoloration of eyes. Neck: Denies pain.  Denies swelling.  Denies pain with movement. Cardiovascular: Denies chest pain, palpitations.  Denies edema.  Denies orthopnea Respiratory: Denies shortness of breath, cough.  Denies wheezing.  Denies sputum production Gastrointestinal: Denies abdominal pain, swelling.  Denies nausea, vomiting, diarrhea.  Denies melena.  Denies hematemesis. Musculoskeletal: Denies limitation of movement.  Denies deformity or swelling.  Denies pain.  Denies arthralgias or myalgias. Genitourinary: Denies pelvic pain.  Denies urinary frequency or hesitancy.  Denies dysuria.  Skin: Denies rash.  Denies petechiae, purpura, ecchymosis. Neurological: Denies headache.  Denies syncope.  Denies seizure activity.  Denies weakness or paresthesia.  Denies slurred speech, drooping face.  Denies visual change. Psychiatric: Denies depression, anxiety.  Denies suicidal thoughts or ideation.  Denies hallucinations.  Past Medical History:  Diagnosis Date  . Acute kidney injury ([place]) 01/08/2015  . Arthritis   . Chronic back pain   . Constipation   . Cough   . Diabetes mellitus, type 2 ([place])   . Diverticulosis   . Fibroid    patient thinks this was the reason for her hysterectomy  . GERD (gastroesophageal reflux disease)   . Heart murmur   .  History of sebaceous cyst   . Hyperlipemia   . Hyperplastic colon polyp   . Hypertension   . Hyperthyroidism   . Lumbar radiculopathy   . Shortness of breath 09/13/2013  . Stroke ([place]) 2015?   x4   . Tobacco abuse   . 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 ABDOMINAL HYSTERECTOMY     --?ovaries remain  . COLONOSCOPY W/ BIOPSIES  09/11/2008  . RETINOPATHY SURGERY Bilateral 2013   Dr. Zigmund Daniel    Social History  reports that she quit smoking about 5 years ago. Her smoking use included cigarettes. She has a 17.50 pack-year smoking history. She has never used smokeless tobacco. She reports that she does not drink alcohol and does not use drugs.  Allergies  Allergen Reactions  . Semaglutide Rash    Rybelsus 3 mg    Family History  Problem Relation Age of Onset  . Hypertension Mother   . Sleep apnea Mother   . Diabetes Mother   . Hyperlipidemia Mother   . Lung cancer Father        smoker  . Hypertension Sister   . Diabetes Sister   . Hypertension Brother   . Hypertension Sister   . Diabetes Brother   . Hypertension Brother   . Breast cancer Neg Hx   . Colon cancer Neg Hx   . Esophageal cancer Neg Hx   . Pancreatic cancer Neg Hx   . Liver disease Neg Hx   . Colon polyps Neg Hx      Prior to Admission  medications   Medication Sig Start Date End Date Taking? Authorizing Provider  acarbose (PRECOSE) 25 MG tablet Take 0.5 tablets (12.5 mg total) by mouth 3 (three) times daily with meals. 02/25/20   Renato Shin, MD  amLODipine (NORVASC) 10 MG tablet TAKE 1 TABLET BY MOUTH EVERY DAY 03/16/20   Marrian Salvage, FNP  atorvastatin (LIPITOR) 80 MG tablet TAKE 1 TABLET BY MOUTH EVERY DAY 03/30/20   Marrian Salvage, FNP  Blood Glucose Monitoring Suppl ([place]) w/Device KIT 1 each by Does not apply route 2 (two) times daily. E11.9    [provider]   bromocriptine (PARLODEL) 2.5 MG tablet Take 0.5 tablets (1.25 mg total) by mouth daily. 02/24/20   Renato Shin, MD  chlorthalidone (HYGROTON) 25 MG tablet Take 25 mg by mouth daily.    [provider]  cloNIDine (CATAPRES) 0.2 MG tablet Take 1 tablet (0.2 mg total) by mouth 3 (three) times daily. 05/21/19   Marrian Salvage, FNP  epoetin alfa-epbx (RETACRIT) 77824 UNIT/ML injection Inject 40,000 Units into the skin every 28 (twenty-eight) days.    [provider]  furosemide (LASIX) 40 MG tablet Take 40 mg by mouth 3 (three) times a week. PRN leg swelling    [provider]  glucose blood (ONETOUCH VERIO) test strip USE AS DIRECTED TWICE DAILY 12/12/19   Marrian Salvage, FNP  metoprolol succinate (TOPROL-XL) 100 MG 24 hr tablet Take 100 mg by mouth daily. Take with or immediately following a meal.    [provider]  minoxidil (LONITEN) 10 MG tablet Take 10 mg by mouth 2 (two) times daily.    [provider]  omeprazole (PRILOSEC) 20 MG capsule Take 1 capsule (20 mg total) by mouth 2 (two) times daily before a meal. 05/08/20   Fredia Sorrow, MD  OneTouch Delica Lancets 23N MISC 1 each by Does not apply route 2 (two) times daily. E11.9    [provider]  pantoprazole (PROTONIX) 40 MG tablet Take 1 tablet (40 mg total) by mouth daily. 06/12/19   Marrian Salvage, FNP  polyethylene glycol (MIRALAX / GLYCOLAX) 17 g packet Take 34-51 g by mouth daily as needed for moderate constipation.    [provider]  repaglinide (PRANDIN) 2 MG tablet Take 1 tablet (2 mg total) by mouth 2 (two) times daily before a meal. 01/10/20   Renato Shin, MD    Physical Exam: Vitals:   05/18/20 2245 05/18/20 2300 05/18/20 2348 05/18/20 2350  BP: (!) 126/45 (!) 106/51 (!) 114/46 (!) 114/46  Pulse: (!) 57 (!) 56 (!) 55 (!) 55  Resp: _0 Temp:   98.7 F (37.1 C) 98.7 F (37.1 C)  TempSrc:   Oral Oral  SpO2: 93% 92% 93% 93%     Constitutional: NAD, calm, comfortable Vitals:   05/18/20 2245 05/18/20 2300 05/18/20 2348 05/18/20 2350  BP: (!) 126/45 (!) 106/51 (!) 114/46 (!) 114/46  Pulse: (!) 57 (!) 56 (!) 55 (!) 55  Resp: _1 Temp:   98.7 F (37.1 C) 98.7 F (37.1 C)  TempSrc:   Oral Oral  SpO2: 93% 92% 93% 93%   General: WDWN, Alert and oriented x3.  Eyes: EOMI, PERRL, lids and conjunctivae normal.  Sclera nonicteric HENT:  /AT, external ears normal.  Nares patent without epistasis.  Mucous membranes are moist. Posterior pharynx clear of any exudate or lesions. Normal dentition.  Neck: Soft, normal range of  motion, supple, no masses, no thyromegaly.  Trachea midline Respiratory: clear to auscultation bilaterally, no wheezing, no crackles. Normal respiratory effort. No accessory muscle use.  Cardiovascular: Regular rate and rhythm, no murmurs / rubs / gallops. No extremity edema. 2+ pedal pulses. No carotid bruits.  Abdomen: Soft, no tenderness, nondistended, no rebound or guarding.  No masses palpated. No hepatosplenomegaly. Bowel sounds normoactive Musculoskeletal: FROM. no clubbing / cyanosis. No joint deformity upper and lower extremities. no contractures. Normal muscle tone.  Skin: Warm, dry, intact no rashes, lesions, ulcers. No induration Neurologic: CN 2-12 grossly intact.  Normal speech.  Sensation intact, patella DTR +1 bilaterally. Strength 5/5 in all extremities.   Psychiatric: Normal judgment and insight.  Normal mood.    Labs on Admission: I have personally reviewed following labs and imaging studies  CBC: Recent Labs  Lab 05/18/20 1639  WBC 7.0  NEUTROABS 5.0  HGB 6.6*  HCT 21.4*  MCV 99.5  PLT 355    Basic Metabolic Panel: Recent Labs  Lab 05/18/20 1639  NA 136  K 4.5  CL 101  CO2 20*  GLUCOSE 169*  BUN 72*  CREATININE 3.78*  CALCIUM 8.2*    GFR: Estimated Creatinine Clearance: 11.9 mL/min (A) (by C-G formula based on SCr of 3.78 mg/dL (H)).  Liver  Function Tests: No results for input(s): AST, ALT, ALKPHOS, BILITOT, PROT, ALBUMIN in the last 168 hours.  Urine analysis:    Component Value Date/Time   COLORURINE YELLOW 11/27/2019 2136   APPEARANCEUR HAZY (A) 11/27/2019 2136   LABSPEC 1.011 11/27/2019 2136   PHURINE 6.0 11/27/2019 2136   GLUCOSEU 150 (A) 11/27/2019 2136   GLUCOSEU NEGATIVE 09/04/2009 0721   HGBUR SMALL (A) 11/27/2019 2136   BILIRUBINUR NEGATIVE 11/27/2019 2136   BILIRUBINUR negative 09/24/2019 1003   KETONESUR NEGATIVE 11/27/2019 2136   PROTEINUR >=300 (A) 11/27/2019 2136   UROBILINOGEN 0.2 09/24/2019 1003   UROBILINOGEN 0.2 09/04/2009 0721   NITRITE NEGATIVE 11/27/2019 2136   LEUKOCYTESUR NEGATIVE 11/27/2019 2136    Radiological Exams on Admission: DG Knee Complete 4 Views Left  Result Date: 05/18/2020 CLINICAL DATA:  Knee pain, no known trauma EXAM: LEFT KNEE - COMPLETE 4+ VIEW COMPARISON:  December 01, 2019 FINDINGS: No evidence of fracture or dislocation. Small joint effusion. 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 Superior patellar pole again visualized. Vascular calcifications. IMPRESSION: No acute osseous abnormality visualized. 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 Small joint effusion. Electronically Signed   By: Dahlia Bailiff MD   On: 05/18/2020 17:04    Assessment/Plan Principal Problem:   GI bleed Active Problems:   Essential hypertension   Type 2 diabetes mellitus with hyperglycemia (HCC)   CKD (chronic kidney disease) stage 3, GFR 30-59 ml/min (HCC)   Knee pain     DVT prophylaxis: SCDs for DVT prophylaxis. No anticoagulation in setting of GI bleeding.   Code Status:   ***  Family Communication:  *** Disposition Plan:   Patient is from:  ***  Anticipated DC to:  ***  Anticipated DC date:  ***  Anticipated DC barriers:   Consults called:  Gastroenterology-secure chat sent to Dr. Cindee Lame Admission status:   Inpatient   Yevonne Aline Nehemiah Mcfarren MD [facility]  How to contact the [facility] Attending or Consulting provider [place] or covering provider during after hours [place], for this patient?   1. Check the care team in [facility] and look for a) attending/consulting TRH provider listed  and b) the [facility] team listed 2. Log into [email or web address] and use [place]'s universal password to access. If you do not have the password, please contact the hospital operator. 3. Locate the [facility] provider you are looking for under [facility] and page to a number that you can be directly reached. 4. If you still have difficulty reaching the provider, please page the [facility] (Director on Call) for the Hospitalists listed on amion for assistance.  05/18/2020, 11:59 PM

## 2020-05-19 ENCOUNTER — Other Ambulatory Visit: Payer: Self-pay

## 2020-05-19 DIAGNOSIS — N184 Chronic kidney disease, stage 4 (severe): Secondary | ICD-10-CM

## 2020-05-19 DIAGNOSIS — D638 Anemia in other chronic diseases classified elsewhere: Secondary | ICD-10-CM

## 2020-05-19 DIAGNOSIS — R195 Other fecal abnormalities: Secondary | ICD-10-CM

## 2020-05-19 DIAGNOSIS — M25562 Pain in left knee: Secondary | ICD-10-CM

## 2020-05-19 LAB — GLUCOSE, CAPILLARY
Glucose-Capillary: 125 mg/dL — ABNORMAL HIGH (ref 70–99)
Glucose-Capillary: 67 mg/dL — ABNORMAL LOW (ref 70–99)
Glucose-Capillary: 226 mg/dL — ABNORMAL HIGH (ref 70–99)

## 2020-05-19 LAB — BASIC METABOLIC PANEL
Anion gap: 12 (ref 5–15)
BUN: 76 mg/dL — ABNORMAL HIGH (ref 8–23)
CO2: 19 mmol/L — ABNORMAL LOW (ref 22–32)
Calcium: 8.1 mg/dL — ABNORMAL LOW (ref 8.9–10.3)
Chloride: 107 mmol/L (ref 98–111)
Creatinine, Ser: 3.74 mg/dL — ABNORMAL HIGH (ref 0.44–1.00)
GFR, Estimated: 12 mL/min — ABNORMAL LOW (ref >60)
Glucose, Bld: 47 mg/dL — ABNORMAL LOW (ref 70–99)
Potassium: 5 mmol/L (ref 3.5–5.1)
Sodium: 138 mmol/L (ref 135–145)

## 2020-05-19 LAB — HEMOGLOBIN AND HEMATOCRIT, BLOOD
HCT: 29.1 % — ABNORMAL LOW (ref 36.0–46.0)
Hemoglobin: 9.6 g/dL — ABNORMAL LOW (ref 12.0–15.0)

## 2020-05-19 LAB — CBC
HCT: 21.3 % — ABNORMAL LOW (ref 36.0–46.0)
Hemoglobin: 7.2 g/dL — ABNORMAL LOW (ref 12.0–15.0)
MCH: 31.7 pg (ref 26.0–34.0)
MCHC: 33.8 g/dL (ref 30.0–36.0)
MCV: 93.8 fL (ref 80.0–100.0)
Platelets: 144 10*3/uL — ABNORMAL LOW (ref 150–400)
RBC: 2.27 MIL/uL — ABNORMAL LOW (ref 3.87–5.11)
RDW: 16.3 % — ABNORMAL HIGH (ref 11.5–15.5)
WBC: 6.8 10*3/uL (ref 4.0–10.5)
nRBC: 0 % (ref 0.0–0.2)

## 2020-05-19 LAB — PREPARE RBC (CROSSMATCH)

## 2020-05-19 LAB — URIC ACID: Uric Acid, Serum: 9.4 mg/dL — ABNORMAL HIGH (ref 2.5–7.1)

## 2020-05-19 LAB — SEDIMENTATION RATE: Sed Rate: 90 mm/hr — ABNORMAL HIGH (ref 0–22)

## 2020-05-19 LAB — CBG MONITORING, ED: Glucose-Capillary: 95 mg/dL (ref 70–99)

## 2020-05-19 LAB — RESP PANEL BY RT-PCR (FLU A&B, COVID) ARPGX2
Influenza A by PCR: NEGATIVE
Influenza B by PCR: NEGATIVE
SARS Coronavirus 2 by RT PCR: NEGATIVE

## 2020-05-19 LAB — C-REACTIVE PROTEIN: CRP: 12.9 mg/dL — ABNORMAL HIGH (ref <1.0)

## 2020-05-19 MED ORDER — SODIUM CHLORIDE 0.9% IV SOLUTION: Freq: Once | INTRAVENOUS | Status: DC

## 2020-05-19 MED ORDER — DEXTROSE 50 % IV SOLN
25.0000 mL | Freq: Once | INTRAVENOUS | Status: AC
  Administered 2020-05-19: 25 mL via INTRAVENOUS

## 2020-05-19 MED ORDER — CHLORTHALIDONE 25 MG PO TABS
25.0000 mg | ORAL_TABLET | Freq: Every day | ORAL | Status: DC
  Administered 2020-05-19: 25 mg via ORAL
  Filled 2020-05-19: qty 1

## 2020-05-19 MED ORDER — HYDROMORPHONE HCL 1 MG/ML IJ SOLN
0.5000 mg | Freq: Once | INTRAMUSCULAR | Status: AC
Start: 2020-05-19 — End: 2020-05-19
  Administered 2020-05-19: 0.5 mg via INTRAVENOUS
  Filled 2020-05-19: qty 1

## 2020-05-19 MED ORDER — PANTOPRAZOLE SODIUM 40 MG PO TBEC
40.0000 mg | DELAYED_RELEASE_TABLET | Freq: Every day | ORAL | Status: DC
  Administered 2020-05-19 – 2020-05-22 (×4): 40 mg via ORAL
  Filled 2020-05-19 (×4): qty 1

## 2020-05-19 MED ORDER — ENSURE ENLIVE PO LIQD
237.0000 mL | Freq: Two times a day (BID) | ORAL | Status: DC
  Administered 2020-05-20 – 2020-05-22 (×2): 237 mL via ORAL

## 2020-05-19 MED ORDER — ATORVASTATIN CALCIUM 80 MG PO TABS
80.0000 mg | ORAL_TABLET | Freq: Every day | ORAL | Status: DC
  Administered 2020-05-19 – 2020-05-22 (×4): 80 mg via ORAL
  Filled 2020-05-19 (×4): qty 1

## 2020-05-19 MED ORDER — ALLOPURINOL 100 MG PO TABS
100.0000 mg | ORAL_TABLET | Freq: Every day | ORAL | Status: DC
  Administered 2020-05-19 – 2020-05-22 (×4): 100 mg via ORAL
  Filled 2020-05-19 (×4): qty 1

## 2020-05-19 MED ORDER — INSULIN ASPART 100 UNIT/ML ~~LOC~~ SOLN
0.0000 [IU] | Freq: Four times a day (QID) | SUBCUTANEOUS | Status: DC
  Administered 2020-05-19: 3 [IU] via SUBCUTANEOUS
  Administered 2020-05-20: 7 [IU] via SUBCUTANEOUS
  Administered 2020-05-20: 5 [IU] via SUBCUTANEOUS
  Administered 2020-05-20: 9 [IU] via SUBCUTANEOUS

## 2020-05-19 MED ORDER — CLONIDINE HCL 0.2 MG PO TABS
0.2000 mg | ORAL_TABLET | Freq: Three times a day (TID) | ORAL | Status: DC
  Administered 2020-05-19 – 2020-05-22 (×9): 0.2 mg via ORAL
  Filled 2020-05-19 (×10): qty 1

## 2020-05-19 MED ORDER — ACETAMINOPHEN 325 MG PO TABS
650.0000 mg | ORAL_TABLET | Freq: Four times a day (QID) | ORAL | Status: DC | PRN
  Administered 2020-05-21 – 2020-05-22 (×2): 650 mg via ORAL
  Filled 2020-05-19 (×2): qty 2

## 2020-05-19 MED ORDER — DIPHENHYDRAMINE HCL 25 MG PO CAPS
25.0000 mg | ORAL_CAPSULE | Freq: Once | ORAL | Status: AC
  Administered 2020-05-19: 25 mg via ORAL
  Filled 2020-05-19: qty 1

## 2020-05-19 MED ORDER — DEXTROSE 50 % IV SOLN
INTRAVENOUS | Status: AC
  Filled 2020-05-19: qty 50

## 2020-05-19 MED ORDER — MINOXIDIL 10 MG PO TABS
10.0000 mg | ORAL_TABLET | Freq: Two times a day (BID) | ORAL | Status: DC
  Administered 2020-05-19 – 2020-05-22 (×6): 10 mg via ORAL
  Filled 2020-05-19 (×8): qty 1

## 2020-05-19 MED ORDER — ACARBOSE 25 MG PO TABS
12.5000 mg | ORAL_TABLET | Freq: Three times a day (TID) | ORAL | Status: DC
  Filled 2020-05-19 (×2): qty 1

## 2020-05-19 MED ORDER — LACTATED RINGERS IV SOLN: INTRAVENOUS | Status: DC

## 2020-05-19 MED ORDER — ONDANSETRON HCL 4 MG PO TABS: 4.0000 mg | ORAL_TABLET | Freq: Four times a day (QID) | ORAL | Status: DC | PRN

## 2020-05-19 MED ORDER — ACETAMINOPHEN 325 MG PO TABS
650.0000 mg | ORAL_TABLET | Freq: Once | ORAL | Status: AC
  Administered 2020-05-19: 650 mg via ORAL
  Filled 2020-05-19: qty 2

## 2020-05-19 MED ORDER — ACETAMINOPHEN 650 MG RE SUPP: 650.0000 mg | Freq: Four times a day (QID) | RECTAL | Status: DC | PRN

## 2020-05-19 MED ORDER — FUROSEMIDE 10 MG/ML IJ SOLN
20.0000 mg | Freq: Once | INTRAMUSCULAR | Status: AC
  Administered 2020-05-19: 20 mg via INTRAVENOUS
  Filled 2020-05-19: qty 2

## 2020-05-19 MED ORDER — METHYLPREDNISOLONE SODIUM SUCC 125 MG IJ SOLR
60.0000 mg | Freq: Every day | INTRAMUSCULAR | Status: DC
  Administered 2020-05-19 – 2020-05-20 (×2): 60 mg via INTRAVENOUS
  Filled 2020-05-19 (×3): qty 2

## 2020-05-19 MED ORDER — ONDANSETRON HCL 4 MG/2ML IJ SOLN: 4.0000 mg | Freq: Four times a day (QID) | INTRAMUSCULAR | Status: DC | PRN

## 2020-05-19 MED ORDER — FUROSEMIDE 10 MG/ML IJ SOLN: 20.0000 mg | Freq: Once | INTRAMUSCULAR | Status: DC

## 2020-05-19 MED ORDER — HYDROCODONE-ACETAMINOPHEN 5-325 MG PO TABS
1.0000 | ORAL_TABLET | Freq: Four times a day (QID) | ORAL | Status: AC | PRN
  Administered 2020-05-19 – 2020-05-21 (×4): 1 via ORAL
  Filled 2020-05-19 (×5): qty 1

## 2020-05-19 MED ORDER — REPAGLINIDE 2 MG PO TABS: 2.0000 mg | ORAL_TABLET | Freq: Two times a day (BID) | ORAL | Status: DC

## 2020-05-19 MED ORDER — AMLODIPINE BESYLATE 10 MG PO TABS
10.0000 mg | ORAL_TABLET | Freq: Every day | ORAL | Status: DC
  Administered 2020-05-19 – 2020-05-22 (×4): 10 mg via ORAL
  Filled 2020-05-19 (×4): qty 1

## 2020-05-19 NOTE — Progress Notes (Signed)
   05/19/20 1148  Assess: MEWS Score  Temp 97.8 F (36.6 C)  BP (!) 98/41  Pulse Rate (!) 50  ECG Heart Rate (!) 50  Resp 10  O2 Device Nasal Cannula  O2 Flow Rate (L/min) 2 L/min  Assess: MEWS Score  MEWS Temp 0  MEWS Systolic 1  MEWS Pulse 1  MEWS RR 1  MEWS LOC 0  MEWS Score 3  MEWS Score Color Yellow  Assess: if the MEWS score is Yellow or Red  Were vital signs taken at a resting state? No  Focused Assessment No change from prior assessment  Early Detection of Sepsis Score *See Row Information* Low  MEWS guidelines implemented *See Row Information* Yes  Treat  MEWS Interventions Escalated (See documentation below)  Take Vital Signs  Increase Vital Sign Frequency  Yellow: Q 2hr X 2 then Q 4hr X 2, if remains yellow, continue Q 4hrs  Escalate  MEWS: Escalate Yellow: discuss with charge nurse/RN and consider discussing with provider and RRT  Notify: Charge Nurse/RN  Name of Charge Nurse/RN Notified Sarah, RN  Date Charge Nurse/RN Notified 05/19/20  Time Charge Nurse/RN Notified 1226

## 2020-05-19 NOTE — H&P (View-Only) (Signed)
[facility] Consult: 3:17 PM 05/19/2020  LOS: 1 day    Referring Provider: Dr Starla Link  Primary Care Physician: PA Erby Pian in [facility].  [facility].  Primary Gastroenterologist:  Dr. Silvano Rusk    Reason for Consultation:  Anemia.  Black, FOBT + stools.     HPI: [name] is a 70 y.o. female.  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  NIDDM. Htn.  Anemia.  Gout.  Perinephric hematoma in fall 2021.    Hyperplastic colon polyps and sigmoid diverticulosis on colonoscopy in 2010. 11/2018 colonoscopy.  Solitary ileocecal valve ulcer, biopsied.  Examined ileum normal.  Hypertrophied anal papillae. Path: Fibrosis and inflammation consistent with ulcer, no adenomatous change, granuloma, malignancy.    Patient has been receiving  iron infusions as well as Epogen injections on a fairly regular basis. Anemia with Hb 7.8 in early November 2021 and range since then anywhere from 7.4-9.1. Iron studies in February and in early 05-22-22 with normal iron levels, diminished TIBC, at or above normal iron sats and at or above normal ferritins. She got a new PCP in [facility] and initial office visit was 05/07/20.  Patient reported black stools, she was anemic and was FOBT positive.  PCP referred her to the ED at [facility] long.  CT of the abdomen was unremarkable, with improved appearance of subcapsular renal hematoma.  Again tested FOBT positive.  Hb  9.1.  She was started on omeprazole (now this and Protonix listed on meds).  Referred back to her PCP who arranged for a colonoscopy, EGD with Dr.Badedrine through the [facility] health system on 4/22.  GI aborted both colonoscopy and EGD because she had not properly prepped.  These were rescheduled for May 25.  Patient came to [facility] ED yesterday because of pain and  swelling in her knee.  Her dyspnea on exertion with walking around the house is stable.  No angina.  No palpitations.  No fevers or chills.  Appetite generally diminished but no nausea or vomiting.  No abdominal pain.  Stools are formed, black without obvious blood.  FOBT positive on scant stool specimen.  No obvious melena and PA noted flecks of red appearing stool. Hgb 6.6>> 7.2. was 9.1 ten days ago.   MCV 93.  Platelets 144. BUNs/creatinine 76/3.7, GFR 12, was 13 ten d ago CRP 12.9  2 PRBCs transfused this morning.  Patient does not use NSAIDs.  Does not drink alcohol.  Past Medical History:  Diagnosis Date  . Acute kidney injury ([place]) 01/08/2015  . Arthritis   . Chronic back pain   . Constipation   . Cough   . Diabetes mellitus, type 2 ([place])   . Diverticulosis   . Fibroid    patient thinks this was the reason for her hysterectomy  . GERD (gastroesophageal reflux disease)   . Heart murmur   . History of sebaceous cyst   . Hyperlipemia   . Hyperplastic colon polyp   . Hypertension   . Hyperthyroidism   . Lumbar radiculopathy   .  Shortness of breath 09/13/2013  . Stroke ([place]) 2015?   x4   . Tobacco abuse   . 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 ABDOMINAL HYSTERECTOMY     --?ovaries remain  . COLONOSCOPY W/ BIOPSIES  09/11/2008  . RETINOPATHY SURGERY Bilateral 2013   Dr. Zigmund Daniel    Prior to Admission medications   Medication Sig Start Date End Date Taking? Authorizing Provider  acarbose (PRECOSE) 25 MG tablet Take 0.5 tablets (12.5 mg total) by mouth 3 (three) times daily with meals. 02/25/20   Renato Shin, MD  amLODipine (NORVASC) 10 MG tablet TAKE 1 TABLET BY MOUTH EVERY DAY 03/16/20   Marrian Salvage, FNP  atorvastatin (LIPITOR) 80 MG tablet TAKE 1 TABLET BY MOUTH EVERY DAY 03/30/20   Marrian Salvage, FNP  Blood Glucose Monitoring Suppl ([place]) w/Device KIT 1 each by Does not apply route 2 (two) times daily. E11.9    [provider]  bromocriptine (PARLODEL) 2.5 MG tablet Take 0.5 tablets (1.25 mg total) by mouth daily. 02/24/20   Renato Shin, MD  chlorthalidone (HYGROTON) 25 MG tablet Take 25 mg by mouth daily.    [provider]  cloNIDine (CATAPRES) 0.2 MG tablet Take 1 tablet (0.2 mg total) by mouth 3 (three) times daily. 05/21/19   Marrian Salvage, FNP  epoetin alfa-epbx (RETACRIT) 79150 UNIT/ML injection Inject 40,000 Units into the skin every 28 (twenty-eight) days.    [provider]  furosemide (LASIX) 40 MG tablet Take 40 mg by mouth 3 (three) times a week. PRN leg swelling    [provider]  glucose blood (ONETOUCH VERIO) test strip USE AS DIRECTED TWICE DAILY 12/12/19   Marrian Salvage, FNP  metoprolol succinate (TOPROL-XL) 100 MG 24 hr tablet Take 100 mg by mouth daily. Take with or immediately following a meal.    [provider]  minoxidil (LONITEN) 10 MG tablet Take 10 mg by mouth 2 (two) times daily.    [provider]  omeprazole (PRILOSEC) 20 MG capsule Take 1 capsule (20 mg total) by mouth 2 (two) times daily before a meal. 05/08/20   Fredia Sorrow, MD  OneTouch Delica Lancets 56P MISC 1 each by Does not apply route 2 (two) times daily. E11.9    [provider]  pantoprazole (PROTONIX) 40 MG tablet Take 1 tablet (40 mg total) by mouth daily. 06/12/19   Marrian Salvage, FNP  polyethylene glycol (MIRALAX / GLYCOLAX) 17 g packet Take 34-51 g by mouth daily as needed for moderate constipation.    [provider]  repaglinide (PRANDIN) 2 MG tablet Take 1 tablet (2 mg total) by mouth 2 (two) times daily before a meal. 01/10/20   Renato Shin, MD    Scheduled Meds: . sodium chloride   Intravenous Once  . allopurinol  100 mg Oral Daily  . amLODipine  10 mg Oral Daily  . atorvastatin  80 mg Oral Daily  . cloNIDine  0.2 mg  Oral TID  . insulin aspart  0-9 Units Subcutaneous Q6H  . methylPREDNISolone (SOLU-MEDROL) injection  60 mg Intravenous Daily  . minoxidil  10 mg Oral BID  . pantoprazole  40 mg Oral Daily   Infusions: . lactated ringers 75 mL/hr at 05/19/20 0635   PRN Meds: acetaminophen **OR** acetaminophen, HYDROcodone-acetaminophen, ondansetron **OR** ondansetron (ZOFRAN) IV   Allergies as of 05/18/2020 -  Review Complete 05/18/2020  Allergen Reaction Noted  . Semaglutide Rash 04/10/2020    Family History  Problem Relation Age of Onset  . Hypertension Mother   . Sleep apnea Mother   . Diabetes Mother   . Hyperlipidemia Mother   . Lung cancer Father        smoker  . Hypertension Sister   . Diabetes Sister   . Hypertension Brother   . Hypertension Sister   . Diabetes Brother   . Hypertension Brother   . Breast cancer Neg Hx   . Colon cancer Neg Hx   . Esophageal cancer Neg Hx   . Pancreatic cancer Neg Hx   . Liver disease Neg Hx   . Colon polyps Neg Hx     Social History   Socioeconomic History  . Marital status: Divorced    Spouse name: Not on file  . Number of children: 1  . Years of education: 12  . Highest education level: Not on file  Occupational History  . 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 Smoking status: Former Smoker    Packs/day: 0.50    Years: 35.00    Pack years: 17.50    Types: Cigarettes    Quit date: 07/07/2014    Years since quitting: 5.8  . Smokeless tobacco: Never Used  Vaping Use  . Vaping Use: Never used  Substance and Sexual Activity  . Alcohol use: No  . Drug use: No  . Sexual activity: Never    Partners: Male    Birth control/protection: Surgical    Comment: TAH--?ovaries remain  Other Topics Concern  . Not on file  Social History Narrative   Divorced 1 son    Retired Museum/gallery curator for [employer]   Former smoker no alcohol caffeine or drug use report denies abuse  and feels safe at home.   Social Determinants of Health   Financial Resource Strain: Medium Risk  . Difficulty of Paying Living Expenses: Somewhat hard  Food Insecurity: Food Insecurity Present  . Worried About Charity fundraiser in the Last Year: Often true  . Ran Out of Food in the Last Year: Sometimes true  Transportation Needs: No Transportation Needs  . Lack of Transportation (Medical): No  . Lack of Transportation (Non-Medical): No  Physical Activity: Sufficiently Active  . Days of Exercise per Week: 5 days  . Minutes of Exercise per Session: 30 min  Stress: No Stress Concern Present  . Feeling of Stress : Not at all  Social Connections: Not on file  Intimate Partner Violence: Not At Risk  . Fear of Current or Ex-Partner: No  . Emotionally Abused: No  . Physically Abused: No  . Sexually Abused: No    REVIEW OF SYSTEMS: Constitutional: Weakness, fatigue. ENT:  No nose bleeds Pulm: Dyspnea on exertion.  Nonproductive cough rarely. CV:  No palpitations, no LE edema.  No angina GU:  No hematuria, no frequency GI: Dysphagia. Heme: Denies unusual or excessive bleeding or bruising Transfusions: Received 2 PRBCs in 11/2019. Neuro: Blurry vision on her left eye, she is due for some sort of laser surgery to that eye in coming weeks.  No headaches, no peripheral tingling or numbness Derm:  No itching, no rash or sores.  Endocrine:  No sweats or chills.  No polyuria or dysuria Immunization: Vaccinated and boosted with [employer] COVID-19 shot Travel:  None beyond local counties in last few  months.    PHYSICAL EXAM: Vital signs in last 24 hours: Vitals:   05/19/20 1338 05/19/20 1418  BP: (!) 120/47 (!) 110/49  Pulse: (!) 46 (!) 48  Resp: (!) 9 10  Temp: 97.9 F (36.6 C) 97.9 F (36.6 C)  SpO2: 99% 91%   Wt Readings from Last 3 Encounters:  05/19/20 63 kg  05/08/20 61.2 kg  04/23/20 61.7 kg    General: Patient appears chronically ill.  Comfortable. Head: No facial  asymmetry or swelling.  No signs of head trauma. Eyes: No scleral icterus.  No conjunctival pallor.  EOMI Ears: Not hard of hearing Nose: No congestion or discharge Mouth: Moist, pink oral mucosa.  Nonspecific off-white coating on the tongue.  Tongue is midline.  Poor dentition, the bulk of her teeth are gone. Neck: No JVD, no masses, no thyromegaly Lungs: Crackles in the bases.  No labored breathing or cough. Heart: RRR with soft systolic murmur. Abdomen: Soft without tenderness.  No HSM, masses, bruits, hernias..   Rectal: Deferred Musc/Skeltl: Some joint swelling, tenderness and heat on the left. Extremities: No CCE. Neurologic: Alert.  Oriented x3.  Response time and speech is slow but accurate.  Moves all 4 limbs, strength not tested.  No tremors or involuntary movement. Skin: No rash, no sores, no suspicious lesions Nodes: No cervical adenopathy Psych: Cooperative, pleasant, calm.  Intake/Output from previous day: No intake/output data recorded. Intake/Output this shift: Total I/O In: 315 [Blood:315] Out: -   LAB RESULTS: Recent Labs    05/18/20 1639 05/19/20 0602  WBC 7.0 6.8  HGB 6.6* 7.2*  HCT 21.4* 21.3*  PLT 154 144*   BMET Lab Results  Component Value Date   NA 138 05/19/2020   NA 136 05/18/2020   NA 136 05/08/2020   K 5.0 05/19/2020   K 4.5 05/18/2020   K 4.2 05/08/2020   CL 107 05/19/2020   CL 101 05/18/2020   CL 104 05/08/2020   CO2 19 (L) 05/19/2020   CO2 20 (L) 05/18/2020   CO2 23 05/08/2020   GLUCOSE 47 (L) 05/19/2020   GLUCOSE 169 (H) 05/18/2020   GLUCOSE 151 (H) 05/08/2020   BUN 76 (H) 05/19/2020   BUN 72 (H) 05/18/2020   BUN 80 (H) 05/08/2020   CREATININE 3.74 (H) 05/19/2020   CREATININE 3.78 (H) 05/18/2020   CREATININE 3.62 (H) 05/08/2020   CALCIUM 8.1 (L) 05/19/2020   CALCIUM 8.2 (L) 05/18/2020   CALCIUM 8.7 (L) 05/08/2020   LFT No results for input(s): PROT, ALBUMIN, AST, ALT, ALKPHOS, BILITOT, BILIDIR, IBILI in the last 72  hours. PT/INR Lab Results  Component Value Date   INR 0.9 05/08/2020   INR 1.0 11/25/2019   INR 1.0 11/11/2019   Hepatitis Panel No results for input(s): HEPBSAG, HCVAB, HEPAIGM, HEPBIGM in the last 72 hours. C-Diff No components found for: CDIFF Lipase  No results found for: LIPASE  Drugs of Abuse  No results found for: LABOPIA, COCAINSCRNUR, LABBENZ, AMPHETMU, THCU, LABBARB   RADIOLOGY STUDIES: DG Knee Complete 4 Views Left  Result Date: 05/18/2020 CLINICAL DATA:  Knee pain, no known trauma EXAM: LEFT KNEE - COMPLETE 4+ VIEW COMPARISON:  December 01, 2019 FINDINGS: No evidence of fracture or dislocation. Small joint effusion. 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 Superior patellar pole again visualized. Vascular calcifications. IMPRESSION: No acute osseous abnormality visualized. 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 Small joint effusion. Electronically Signed   By: Dahlia Bailiff  MD   On: 05/18/2020 17:04    IMPRESSION:   *   Acute on chronic anemia.  Baseline anemia of late stage CKD.  Receiving regular doses of Epogen and occasional Feraheme under guidance of Dr. Joelyn Oms. Black, FOBT positive stool. Has had hyperplastic and adenomatous colon polyps in 2010.  Most recent colonoscopy 11/2018 had ulcer at IC valve with pathology showing fibrosis, inflammation, ulcer.  No recurrent polyps. It is unclear but she is taking either omeprazole 20 mg twice daily or Protonix 40 mg daily or perhaps both, patient is not sure. Since she is taking PPI the possibility of ulcers is less likely.  Could have AVMs, neoplasm or acid peptic disease as a source of the heme positive stool. Received 2 PRBCs today.  *   Late stage CKD.  Followed by Dr. Joelyn Oms at [facility]  *    acute left knee pain concerning for gout.  Being treated with IV Solu-Medrol and allopurinol.    PLAN:     *    Probably best to start out with an EGD.  We  will arrange this for tomorrow. Okay to eat tonight but n.p.o. after midnight.   [name]  05/19/2020, 3:17 PM Phone [phone]  GI ATTENDING  HISTORY,LABS.X-RAYS REVIEWED. PATIENT SEEN AND EXAMINED. AGREE WITH CONSULT NOTE AS ABOVE. MULTIPLE MEDICAL PROBLEMS. CHRONIC ANEMIA (NORMOCYTIC) IN THE FACE OF ADVANCED RENAL DISEASE. REPORTS DARK STOOL---OF UNKNOWN CLINICAL SIGNIFICANCE. HEME +. RECENT COLONOSCOPY AS OUTLINED. AGREE WITH EGD TO R/O ANY SIGNIFICANT UGI MUCOSAL PATHOLOGY. TENTATIVELY SCHEDULED FOR TOMORROW AFTERNOON. THANK YOU.   Docia Chuck. Geri Seminole., M.D. [facility] Division of Gastroenterology

## 2020-05-19 NOTE — Progress Notes (Signed)
Patient ID: [name], female   DOB: 12/11/1950, 70 y.o.   MRN: [id number]  PROGRESS NOTE    [name]  SEG:[id number] DOB: 1950-11-20 DOA: 05/18/2020 PCP: System, Provider Not In   Brief Narrative:  70 year old female with history of hypertension, diabetes mellitus type 2, chronic kidney disease stage IV, anemia with history of GI bleeding with recent admission a few weeks ago to [facility] for GI bleeding requiring colonoscopy which was inconclusive because of inadequate bowel prep and recommended repeat colonoscopy in the next few weeks presented with worsening left knee pain and fatigue.  On presentation, hemoglobin was 6.6.  She had left knee aspiration performed by the ER provider which showed some white blood cells but less than would be indicated for septic arthritis; synovial fluid also showed monosodium urate crystals.  Assessment & Plan:   Acute on chronic anemia Probable GI bleed -Presented with hemoglobin of 6.6. -recent admission a few weeks ago to [facility] for GI bleeding requiring colonoscopy which was inconclusive because of inadequate bowel prep and recommended repeat colonoscopy in the next few weeks -2 units packed red cells transfusion has been ordered on admission.  Monitor H&H.  Continue Protonix. -GI consultation is pending.  N.p.o. for now.  Left knee pain Concern for acute gout -Synovial fluid analysis does not appear to be consistent with septic arthritis but intracellular shows monosodium urate crystals.  will treat her as acute gout flareup; cannot use NSAIDs because of renal function.  Will use intravenous Solu-Medrol 60 mg daily for now and add allopurinol.  If pain does not improve, might consider colchicine  Diabetes mellitus type 2 with hyperglycemia -Continue CBGs with SSI and repaglinide  Chronic kidney disease stage IV Metabolic acidosis -Creatinine 3.74 today.  Creatinine was 3.52 on discharge on 12/02/2019.  Strict  input and output.  Daily weights.  Hold chlorthalidone for now.  DC IV fluids.  If renal function does not improve, might need nephrology evaluation otherwise outpatient nephrology follow-up.  Thrombocytopenia -Questionable cause.  Monitor  Hypertension -Blood pressure currently stable.  Continue amlodipine, clonidine, minoxidil.  Hold chlorthalidone  Hyperlipidemia -Continue statin   DVT prophylaxis: SCDs Code Status: Full Family Communication: None at bedside Disposition Plan: Status is: Inpatient  Remains inpatient appropriate because:Inpatient level of care appropriate due to severity of illness   Dispo: The patient is from: Home              Anticipated d/c is to: Home              Patient currently is not medically stable to d/c.   Difficult to place patient No   Consultants: GI  Procedures: None  Antimicrobials: None   Subjective: Patient seen and examined at bedside.  Poor historian.  Complains of left knee pain.  No overnight fever or vomiting reported.  Objective: Vitals:   05/19/20 0732 05/19/20 0734 05/19/20 0851 05/19/20 0915  BP:  (!) 129/53 127/61 (!) 133/52  Pulse:  (!) 58 (!) 51 (!) 53  Resp:  16 12 15   Temp:  98.4 F (36.9 C) 97.7 F (36.5 C) 98.6 F (37 C)  TempSrc:  Oral Oral Oral  SpO2:  97% 99% 97%  Weight: 63 kg   63 kg  Height: 5\' 2"  (1.575 m)   5\' 2"  (1.575 m)   No intake or output data in the 24 hours ending 05/19/20 1109 Filed Weights   05/19/20 0732 05/19/20 0915  Weight: 63 kg 63  kg    Examination:  General exam: Appears calm and comfortable.  Looks chronically ill. Respiratory system: Bilateral decreased breath sounds at bases with some scattered crackles Cardiovascular system: S1 & S2 heard, mildly bradycardic  gastrointestinal system: Abdomen is nondistended, soft and nontender. Normal bowel sounds heard. Extremities: No cyanosis, clubbing; left knee swelling/tenderness and decreased range of motion present Central  nervous system: Awake, very slow to respond to questions.  Poor historian.  No focal neurological deficits. Moving extremities Skin: No other ecchymosis/lesions Psychiatry: Flat affect   Data Reviewed: I have personally reviewed following labs and imaging studies  CBC: Recent Labs  Lab 05/18/20 1639 05/19/20 0602  WBC 7.0 6.8  NEUTROABS 5.0  --   HGB 6.6* 7.2*  HCT 21.4* 21.3*  MCV 99.5 93.8  PLT 154 149*   Basic Metabolic Panel: Recent Labs  Lab 05/18/20 1639 05/19/20 0602  NA 136 138  K 4.5 5.0  CL 101 107  CO2 20* 19*  GLUCOSE 169* 47*  BUN 72* 76*  CREATININE 3.78* 3.74*  CALCIUM 8.2* 8.1*   GFR: Estimated Creatinine Clearance: 12.2 mL/min (A) (by C-G formula based on SCr of 3.74 mg/dL (H)). Liver Function Tests: No results for input(s): AST, ALT, ALKPHOS, BILITOT, PROT, ALBUMIN in the last 168 hours. No results for input(s): LIPASE, AMYLASE in the last 168 hours. No results for input(s): AMMONIA in the last 168 hours. Coagulation Profile: No results for input(s): INR, PROTIME in the last 168 hours. Cardiac Enzymes: No results for input(s): CKTOTAL, CKMB, CKMBINDEX, TROPONINI in the last 168 hours. BNP (last 3 results) No results for input(s): PROBNP in the last 8760 hours. HbA1C: No results for input(s): HGBA1C in the last 72 hours. CBG: Recent Labs  Lab 05/19/20 0731  GLUCAP 95   Lipid Profile: No results for input(s): CHOL, HDL, LDLCALC, TRIG, CHOLHDL, LDLDIRECT in the last 72 hours. Thyroid Function Tests: No results for input(s): TSH, T4TOTAL, FREET4, T3FREE, THYROIDAB in the last 72 hours. Anemia Panel: No results for input(s): VITAMINB12, FOLATE, FERRITIN, TIBC, IRON, RETICCTPCT in the last 72 hours. Sepsis Labs: Recent Labs  Lab 05/18/20 2028  LATICACIDVEN 1.2    Recent Results (from the past 240 hour(s))  Blood culture (routine x 2)     Status: None (Preliminary result)   Collection Time: 05/18/20  6:45 PM   Specimen: BLOOD  Result  Value Ref Range Status   Specimen Description BLOOD RIGHT ARM  Final   Special Requests   Final    BOTTLES DRAWN AEROBIC AND ANAEROBIC Blood Culture results may not be optimal due to an inadequate volume of blood received in culture bottles   Culture   Final    NO GROWTH < 12 HOURS Performed at [facility], [place] [address], [place], [place] [address]    Report Status PENDING  Incomplete  Body fluid culture w Gram Stain     Status: None (Preliminary result)   Collection Time: 05/18/20  9:02 PM   Specimen: Body Fluid  Result Value Ref Range Status   Specimen Description FLUID SYNOVIAL  Final   Special Requests NONE  Final   Gram Stain   Final    ABUNDANT WBC PRESENT, PREDOMINANTLY PMN NO ORGANISMS SEEN    Culture   Final    NO GROWTH < 12 HOURS Performed at [facility], [address] [place], [place] [address]    Report Status PENDING  Incomplete  Resp Panel by RT-PCR (Flu A&B, Covid)  Nasopharyngeal Swab     Status: None   Collection Time: 05/19/20  5:27 AM   Specimen: Nasopharyngeal Swab; Nasopharyngeal(NP) swabs in vial transport medium  Result Value Ref Range Status   SARS Coronavirus 2 by RT PCR NEGATIVE NEGATIVE Final    Comment: (NOTE) SARS-CoV-2 target nucleic acids are NOT DETECTED.  The SARS-CoV-2 RNA is generally detectable in upper respiratory specimens during the acute phase of infection. The lowest concentration of SARS-CoV-2 viral copies this assay can detect is 138 copies/mL. A negative result does not preclude SARS-Cov-2 infection and should not be used as the sole basis for treatment or other patient management decisions. A negative result may occur with  improper specimen collection/handling, submission of specimen other than nasopharyngeal swab, presence of viral mutation(s) within the areas targeted by this assay, and inadequate number of viral copies(<138 copies/mL). A negative result must be combined with clinical observations, patient  history, and epidemiological information. The expected result is Negative.  Fact Sheet for Patients:  EntrepreneurPulse.com.au  Fact Sheet for Healthcare Providers:  IncredibleEmployment.be  This test is no t yet approved or cleared by the [place] FDA and  has been authorized for detection and/or diagnosis of SARS-CoV-2 by FDA under an Emergency Use Authorization (EUA). This EUA will remain  in effect (meaning this test can be used) for the duration of the COVID-19 declaration under Section 564(b)(1) of the Act, 21 U.S.C.section 360bbb-3(b)(1), unless the authorization is terminated  or revoked sooner.       Influenza A by PCR NEGATIVE NEGATIVE Final   Influenza B by PCR NEGATIVE NEGATIVE Final    Comment: (NOTE) The Xpert Xpress SARS-CoV-2/FLU/RSV plus assay is intended as an aid in the diagnosis of influenza from Nasopharyngeal swab specimens and should not be used as a sole basis for treatment. Nasal washings and aspirates are unacceptable for Xpert Xpress SARS-CoV-2/FLU/RSV testing.  Fact Sheet for Patients: EntrepreneurPulse.com.au  Fact Sheet for Healthcare Providers: IncredibleEmployment.be  This test is not yet approved or cleared by the [place] FDA and has been authorized for detection and/or diagnosis of SARS-CoV-2 by FDA under an Emergency Use Authorization (EUA). This EUA will remain in effect (meaning this test can be used) for the duration of the COVID-19 declaration under Section 564(b)(1) of the Act, 21 U.S.C. section 360bbb-3(b)(1), unless the authorization is terminated or revoked.  Performed at [facility], [place] [address], [place], [place] [address]          Radiology Studies: DG Knee Complete 4 Views Left  Result Date: 05/18/2020 CLINICAL DATA:  Knee pain, no known trauma EXAM: LEFT KNEE - COMPLETE 4+ VIEW COMPARISON:  December 01, 2019 FINDINGS: No  evidence of fracture or dislocation. Small joint effusion. 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 Superior patellar pole again visualized. Vascular calcifications. IMPRESSION: No acute osseous abnormality visualized. 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 Small joint effusion. Electronically Signed   By: Dahlia Bailiff MD   On: 05/18/2020 17:04        Scheduled Meds: . sodium chloride   Intravenous Once  . acarbose  12.5 mg Oral TID WC  . amLODipine  10 mg Oral Daily  . atorvastatin  80 mg Oral Daily  . chlorthalidone  25 mg Oral Daily  . cloNIDine  0.2 mg Oral TID  . insulin aspart  0-9 Units Subcutaneous Q6H  . minoxidil  10 mg Oral BID  . pantoprazole  40 mg Oral Daily  .  repaglinide  2 mg Oral BID AC   Continuous Infusions: . lactated ringers 75 mL/hr at 05/19/20 5831          Aline August, MD [facility] 05/19/2020, 11:09 AM

## 2020-05-19 NOTE — ED Notes (Signed)
Attempted to give report to floorx1 

## 2020-05-19 NOTE — Progress Notes (Signed)
[name]  Code Status: FULL  [name] is a 70 y.o. female patient admitted from ED awake, alert - oriented X4 - no acute distress noted. VSS -  no c/o shortness of breath, no c/o chest pain. Orientation to room and floor completed with information packet given to patient. Fall assessment complete, with patient able to verbalize understanding of risk associated with falls, and verbalized understanding to call nursing before up out of bed. Call light within reach, patient able to voice, and demonstrate understanding. Skin, clean-dry- intact without evidence of bruising, or skin tears.  No evidence of skin break down noted on exam.  ?  Will cont to eval and treat per MD orders.  Melonie [place], RN  05/19/2020

## 2020-05-19 NOTE — Consult Note (Addendum)
[facility] Consult: 3:17 PM 05/19/2020  LOS: 1 day    Referring Provider: Dr Starla Link  Primary Care Physician: PA Erby Pian in [facility].  [facility].  Primary Gastroenterologist:  Dr. Silvano Rusk    Reason for Consultation:  Anemia.  Black, FOBT + stools.     HPI: [name] is a 70 y.o. female.  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  NIDDM. Htn.  Anemia.  Gout.  Perinephric hematoma in fall 2021.    Hyperplastic colon polyps and sigmoid diverticulosis on colonoscopy in 2010. 11/2018 colonoscopy.  Solitary ileocecal valve ulcer, biopsied.  Examined ileum normal.  Hypertrophied anal papillae. Path: Fibrosis and inflammation consistent with ulcer, no adenomatous change, granuloma, malignancy.    Patient has been receiving  iron infusions as well as Epogen injections on a fairly regular basis. Anemia with Hb 7.8 in early November 2021 and range since then anywhere from 7.4-9.1. Iron studies in February and in early 05/16/2022 with normal iron levels, diminished TIBC, at or above normal iron sats and at or above normal ferritins. She got a new PCP in [facility] and initial office visit was 05/07/20.  Patient reported black stools, she was anemic and was FOBT positive.  PCP referred her to the ED at [facility] long.  CT of the abdomen was unremarkable, with improved appearance of subcapsular renal hematoma.  Again tested FOBT positive.  Hb  9.1.  She was started on omeprazole (now this and Protonix listed on meds).  Referred back to her PCP who arranged for a colonoscopy, EGD with Dr.Badedrine through the [facility] health system on 4/22.  GI aborted both colonoscopy and EGD because she had not properly prepped.  These were rescheduled for May 25.  Patient came to [facility] ED yesterday because of pain and  swelling in her knee.  Her dyspnea on exertion with walking around the house is stable.  No angina.  No palpitations.  No fevers or chills.  Appetite generally diminished but no nausea or vomiting.  No abdominal pain.  Stools are formed, black without obvious blood.  FOBT positive on scant stool specimen.  No obvious melena and PA noted flecks of red appearing stool. Hgb 6.6>> 7.2. was 9.1 ten days ago.   MCV 93.  Platelets 144. BUNs/creatinine 76/3.7, GFR 12, was 13 ten d ago CRP 12.9  2 PRBCs transfused this morning.  Patient does not use NSAIDs.  Does not drink alcohol.  Past Medical History:  Diagnosis Date  . Acute kidney injury ([place]) 01/08/2015  . Arthritis   . Chronic back pain   . Constipation   . Cough   . Diabetes mellitus, type 2 ([place])   . Diverticulosis   . Fibroid    patient thinks this was the reason for her hysterectomy  . GERD (gastroesophageal reflux disease)   . Heart murmur   . History of sebaceous cyst   . Hyperlipemia   . Hyperplastic colon polyp   . Hypertension   . Hyperthyroidism   . Lumbar radiculopathy   .  Shortness of breath 09/13/2013  . Stroke ([place]) 2015?   x4   . Tobacco abuse   . 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 ABDOMINAL HYSTERECTOMY     --?ovaries remain  . COLONOSCOPY W/ BIOPSIES  09/11/2008  . RETINOPATHY SURGERY Bilateral 2013   Dr. Zigmund Daniel    Prior to Admission medications   Medication Sig Start Date End Date Taking? Authorizing Provider  acarbose (PRECOSE) 25 MG tablet Take 0.5 tablets (12.5 mg total) by mouth 3 (three) times daily with meals. 02/25/20   Renato Shin, MD  amLODipine (NORVASC) 10 MG tablet TAKE 1 TABLET BY MOUTH EVERY DAY 03/16/20   Marrian Salvage, FNP  atorvastatin (LIPITOR) 80 MG tablet TAKE 1 TABLET BY MOUTH EVERY DAY 03/30/20   Marrian Salvage, FNP  Blood Glucose Monitoring Suppl ([place]) w/Device KIT 1 each by Does not apply route 2 (two) times daily. E11.9    [provider]  bromocriptine (PARLODEL) 2.5 MG tablet Take 0.5 tablets (1.25 mg total) by mouth daily. 02/24/20   Renato Shin, MD  chlorthalidone (HYGROTON) 25 MG tablet Take 25 mg by mouth daily.    [provider]  cloNIDine (CATAPRES) 0.2 MG tablet Take 1 tablet (0.2 mg total) by mouth 3 (three) times daily. 05/21/19   Marrian Salvage, FNP  epoetin alfa-epbx (RETACRIT) 75449 UNIT/ML injection Inject 40,000 Units into the skin every 28 (twenty-eight) days.    [provider]  furosemide (LASIX) 40 MG tablet Take 40 mg by mouth 3 (three) times a week. PRN leg swelling    [provider]  glucose blood (ONETOUCH VERIO) test strip USE AS DIRECTED TWICE DAILY 12/12/19   Marrian Salvage, FNP  metoprolol succinate (TOPROL-XL) 100 MG 24 hr tablet Take 100 mg by mouth daily. Take with or immediately following a meal.    [provider]  minoxidil (LONITEN) 10 MG tablet Take 10 mg by mouth 2 (two) times daily.    [provider]  omeprazole (PRILOSEC) 20 MG capsule Take 1 capsule (20 mg total) by mouth 2 (two) times daily before a meal. 05/08/20   Fredia Sorrow, MD  OneTouch Delica Lancets 20F MISC 1 each by Does not apply route 2 (two) times daily. E11.9    [provider]  pantoprazole (PROTONIX) 40 MG tablet Take 1 tablet (40 mg total) by mouth daily. 06/12/19   Marrian Salvage, FNP  polyethylene glycol (MIRALAX / GLYCOLAX) 17 g packet Take 34-51 g by mouth daily as needed for moderate constipation.    [provider]  repaglinide (PRANDIN) 2 MG tablet Take 1 tablet (2 mg total) by mouth 2 (two) times daily before a meal. 01/10/20   Renato Shin, MD    Scheduled Meds: . sodium chloride   Intravenous Once  . allopurinol  100 mg Oral Daily  . amLODipine  10 mg Oral Daily  . atorvastatin  80 mg Oral Daily  . cloNIDine  0.2 mg  Oral TID  . insulin aspart  0-9 Units Subcutaneous Q6H  . methylPREDNISolone (SOLU-MEDROL) injection  60 mg Intravenous Daily  . minoxidil  10 mg Oral BID  . pantoprazole  40 mg Oral Daily   Infusions: . lactated ringers 75 mL/hr at 05/19/20 0635   PRN Meds: acetaminophen **OR** acetaminophen, HYDROcodone-acetaminophen, ondansetron **OR** ondansetron (ZOFRAN) IV   Allergies as of 05/18/2020 -  Review Complete 05/18/2020  Allergen Reaction Noted  . Semaglutide Rash 04/10/2020    Family History  Problem Relation Age of Onset  . Hypertension Mother   . Sleep apnea Mother   . Diabetes Mother   . Hyperlipidemia Mother   . Lung cancer Father        smoker  . Hypertension Sister   . Diabetes Sister   . Hypertension Brother   . Hypertension Sister   . Diabetes Brother   . Hypertension Brother   . Breast cancer Neg Hx   . Colon cancer Neg Hx   . Esophageal cancer Neg Hx   . Pancreatic cancer Neg Hx   . Liver disease Neg Hx   . Colon polyps Neg Hx     Social History   Socioeconomic History  . Marital status: Divorced    Spouse name: Not on file  . Number of children: 1  . Years of education: 34  . Highest education level: Not on file  Occupational History  . 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 Smoking status: Former Smoker    Packs/day: 0.50    Years: 35.00    Pack years: 17.50    Types: Cigarettes    Quit date: 07/07/2014    Years since quitting: 5.8  . Smokeless tobacco: Never Used  Vaping Use  . Vaping Use: Never used  Substance and Sexual Activity  . Alcohol use: No  . Drug use: No  . Sexual activity: Never    Partners: Male    Birth control/protection: Surgical    Comment: TAH--?ovaries remain  Other Topics Concern  . Not on file  Social History Narrative   Divorced 1 son    Retired Museum/gallery curator for [employer]   Former smoker no alcohol caffeine or drug use report denies abuse  and feels safe at home.   Social Determinants of Health   Financial Resource Strain: Medium Risk  . Difficulty of Paying Living Expenses: Somewhat hard  Food Insecurity: Food Insecurity Present  . Worried About Charity fundraiser in the Last Year: Often true  . Ran Out of Food in the Last Year: Sometimes true  Transportation Needs: No Transportation Needs  . Lack of Transportation (Medical): No  . Lack of Transportation (Non-Medical): No  Physical Activity: Sufficiently Active  . Days of Exercise per Week: 5 days  . Minutes of Exercise per Session: 30 min  Stress: No Stress Concern Present  . Feeling of Stress : Not at all  Social Connections: Not on file  Intimate Partner Violence: Not At Risk  . Fear of Current or Ex-Partner: No  . Emotionally Abused: No  . Physically Abused: No  . Sexually Abused: No    REVIEW OF SYSTEMS: Constitutional: Weakness, fatigue. ENT:  No nose bleeds Pulm: Dyspnea on exertion.  Nonproductive cough rarely. CV:  No palpitations, no LE edema.  No angina GU:  No hematuria, no frequency GI: Dysphagia. Heme: Denies unusual or excessive bleeding or bruising Transfusions: Received 2 PRBCs in 11/2019. Neuro: Blurry vision on her left eye, she is due for some sort of laser surgery to that eye in coming weeks.  No headaches, no peripheral tingling or numbness Derm:  No itching, no rash or sores.  Endocrine:  No sweats or chills.  No polyuria or dysuria Immunization: Vaccinated and boosted with [employer] COVID-19 shot Travel:  None beyond local counties in last few  months.    PHYSICAL EXAM: Vital signs in last 24 hours: Vitals:   05/19/20 1338 05/19/20 1418  BP: (!) 120/47 (!) 110/49  Pulse: (!) 46 (!) 48  Resp: (!) 9 10  Temp: 97.9 F (36.6 C) 97.9 F (36.6 C)  SpO2: 99% 91%   Wt Readings from Last 3 Encounters:  05/19/20 63 kg  05/08/20 61.2 kg  04/23/20 61.7 kg    General: Patient appears chronically ill.  Comfortable. Head: No facial  asymmetry or swelling.  No signs of head trauma. Eyes: No scleral icterus.  No conjunctival pallor.  EOMI Ears: Not hard of hearing Nose: No congestion or discharge Mouth: Moist, pink oral mucosa.  Nonspecific off-white coating on the tongue.  Tongue is midline.  Poor dentition, the bulk of her teeth are gone. Neck: No JVD, no masses, no thyromegaly Lungs: Crackles in the bases.  No labored breathing or cough. Heart: RRR with soft systolic murmur. Abdomen: Soft without tenderness.  No HSM, masses, bruits, hernias..   Rectal: Deferred Musc/Skeltl: Some joint swelling, tenderness and heat on the left. Extremities: No CCE. Neurologic: Alert.  Oriented x3.  Response time and speech is slow but accurate.  Moves all 4 limbs, strength not tested.  No tremors or involuntary movement. Skin: No rash, no sores, no suspicious lesions Nodes: No cervical adenopathy Psych: Cooperative, pleasant, calm.  Intake/Output from previous day: No intake/output data recorded. Intake/Output this shift: Total I/O In: 315 [Blood:315] Out: -   LAB RESULTS: Recent Labs    05/18/20 1639 05/19/20 0602  WBC 7.0 6.8  HGB 6.6* 7.2*  HCT 21.4* 21.3*  PLT 154 144*   BMET Lab Results  Component Value Date   NA 138 05/19/2020   NA 136 05/18/2020   NA 136 05/08/2020   K 5.0 05/19/2020   K 4.5 05/18/2020   K 4.2 05/08/2020   CL 107 05/19/2020   CL 101 05/18/2020   CL 104 05/08/2020   CO2 19 (L) 05/19/2020   CO2 20 (L) 05/18/2020   CO2 23 05/08/2020   GLUCOSE 47 (L) 05/19/2020   GLUCOSE 169 (H) 05/18/2020   GLUCOSE 151 (H) 05/08/2020   BUN 76 (H) 05/19/2020   BUN 72 (H) 05/18/2020   BUN 80 (H) 05/08/2020   CREATININE 3.74 (H) 05/19/2020   CREATININE 3.78 (H) 05/18/2020   CREATININE 3.62 (H) 05/08/2020   CALCIUM 8.1 (L) 05/19/2020   CALCIUM 8.2 (L) 05/18/2020   CALCIUM 8.7 (L) 05/08/2020   LFT No results for input(s): PROT, ALBUMIN, AST, ALT, ALKPHOS, BILITOT, BILIDIR, IBILI in the last 72  hours. PT/INR Lab Results  Component Value Date   INR 0.9 05/08/2020   INR 1.0 11/25/2019   INR 1.0 11/11/2019   Hepatitis Panel No results for input(s): HEPBSAG, HCVAB, HEPAIGM, HEPBIGM in the last 72 hours. C-Diff No components found for: CDIFF Lipase  No results found for: LIPASE  Drugs of Abuse  No results found for: LABOPIA, COCAINSCRNUR, LABBENZ, AMPHETMU, THCU, LABBARB   RADIOLOGY STUDIES: DG Knee Complete 4 Views Left  Result Date: 05/18/2020 CLINICAL DATA:  Knee pain, no known trauma EXAM: LEFT KNEE - COMPLETE 4+ VIEW COMPARISON:  December 01, 2019 FINDINGS: No evidence of fracture or dislocation. Small joint effusion. 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 Superior patellar pole again visualized. Vascular calcifications. IMPRESSION: No acute osseous abnormality visualized. 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 Small joint effusion. Electronically Signed   By: Dahlia Bailiff  MD   On: 05/18/2020 17:04    IMPRESSION:   *   Acute on chronic anemia.  Baseline anemia of late stage CKD.  Receiving regular doses of Epogen and occasional Feraheme under guidance of Dr. Joelyn Oms. Black, FOBT positive stool. Has had hyperplastic and adenomatous colon polyps in 2010.  Most recent colonoscopy 11/2018 had ulcer at IC valve with pathology showing fibrosis, inflammation, ulcer.  No recurrent polyps. It is unclear but she is taking either omeprazole 20 mg twice daily or Protonix 40 mg daily or perhaps both, patient is not sure. Since she is taking PPI the possibility of ulcers is less likely.  Could have AVMs, neoplasm or acid peptic disease as a source of the heme positive stool. Received 2 PRBCs today.  *   Late stage CKD.  Follow[facility] by [facility] Jo[facility]ms at [facility]  *    acute left knee pain concerning for gout.  Being treated with IV Solu-Medrol and allopurinol.    PLAN:     *    Probably best to start out with an EGD.  We  will arrange this for tomorrow. Okay to eat tonight but n.p.o. after midnight.   Azucena Freed  05/19/2020, 3:17 PM Phone [phone]  GI ATTENDING  HISTORY,LABS.X-RAYS REVIEWED. PATIENT SEEN AND EXAMINED. AGREE WITH CONSULT NOTE AS ABOVE. MULTIPLE MEDICAL PROBLEMS. CHRONIC ANEMIA (NORMOCYTIC) IN THE FACE OF ADVANCED RENAL DISEASE. REPORTS DARK STOOL---OF UNKNOWN CLINICAL SIGNIFICANCE. HEME +. RECENT COLONOSCOPY AS OUTLINED. AGREE WITH EGD TO R/O ANY SIGNIFICANT UGI MUCOSAL PATHOLOGY. TENTATIVELY SCHEDULED FOR TOMORROW AFTERNOON. THANK YOU.   Docia Chuck. Geri Seminole., M.D. [facility] of Gastroenterology

## 2020-05-19 NOTE — ED Notes (Signed)
Followed up with blood bank. Next unit being processed of blood being processed. Not currently ready.

## 2020-05-19 NOTE — Progress Notes (Signed)
Hypoglycemic Event  CBG: 67  Treatment: Given 26mL D50  Symptoms: None.  Follow-up CBG: Time: 1211 CBG Result: 125  Possible Reasons for Event: Patient NPO.  Comments/MD notified: NA    [name]

## 2020-05-20 ENCOUNTER — Inpatient Hospital Stay (HOSPITAL_COMMUNITY): Payer: Medicare Other | Admitting: Certified Registered Nurse Anesthetist

## 2020-05-20 ENCOUNTER — Encounter (HOSPITAL_COMMUNITY)
Admission: EM | Disposition: A | Discharge status: Home Health | Payer: Self-pay | Source: Home / Self Care | Attending: Internal Medicine

## 2020-05-20 ENCOUNTER — Encounter (HOSPITAL_COMMUNITY): Payer: Self-pay | Admitting: Family Medicine

## 2020-05-20 DIAGNOSIS — E1165 Type 2 diabetes mellitus with hyperglycemia: Secondary | ICD-10-CM

## 2020-05-20 DIAGNOSIS — R195 Other fecal abnormalities: Secondary | ICD-10-CM

## 2020-05-20 DIAGNOSIS — D500 Iron deficiency anemia secondary to blood loss (chronic): Secondary | ICD-10-CM

## 2020-05-20 DIAGNOSIS — I10 Essential (primary) hypertension: Secondary | ICD-10-CM

## 2020-05-20 DIAGNOSIS — M109 Gout, unspecified: Secondary | ICD-10-CM

## 2020-05-20 HISTORY — PX: ESOPHAGOGASTRODUODENOSCOPY (EGD) WITH PROPOFOL: SHX5813

## 2020-05-20 LAB — GLUCOSE, BODY FLUID OTHER: Glucose, Body Fluid Other: 73 mg/dL

## 2020-05-20 LAB — BASIC METABOLIC PANEL
Anion gap: 12 (ref 5–15)
BUN: 101 mg/dL — ABNORMAL HIGH (ref 8–23)
CO2: 18 mmol/L — ABNORMAL LOW (ref 22–32)
Calcium: 7.9 mg/dL — ABNORMAL LOW (ref 8.9–10.3)
Chloride: 99 mmol/L (ref 98–111)
Creatinine, Ser: 4.45 mg/dL — ABNORMAL HIGH (ref 0.44–1.00)
GFR, Estimated: 10 mL/min — ABNORMAL LOW (ref >60)
Glucose, Bld: 525 mg/dL (ref 70–99)
Potassium: 5.6 mmol/L — ABNORMAL HIGH (ref 3.5–5.1)
Sodium: 129 mmol/L — ABNORMAL LOW (ref 135–145)

## 2020-05-20 LAB — COMPREHENSIVE METABOLIC PANEL
ALT: 24 U/L (ref 0–44)
AST: 16 U/L (ref 15–41)
Albumin: 2.7 g/dL — ABNORMAL LOW (ref 3.5–5.0)
Alkaline Phosphatase: 74 U/L (ref 38–126)
Anion gap: 9 (ref 5–15)
BUN: 91 mg/dL — ABNORMAL HIGH (ref 8–23)
CO2: 20 mmol/L — ABNORMAL LOW (ref 22–32)
Calcium: 8.2 mg/dL — ABNORMAL LOW (ref 8.9–10.3)
Chloride: 104 mmol/L (ref 98–111)
Creatinine, Ser: 4.37 mg/dL — ABNORMAL HIGH (ref 0.44–1.00)
GFR, Estimated: 10 mL/min — ABNORMAL LOW (ref >60)
Glucose, Bld: 297 mg/dL — ABNORMAL HIGH (ref 70–99)
Potassium: 5.8 mmol/L — ABNORMAL HIGH (ref 3.5–5.1)
Sodium: 133 mmol/L — ABNORMAL LOW (ref 135–145)
Total Bilirubin: 1.2 mg/dL (ref 0.3–1.2)
Total Protein: 6.2 g/dL — ABNORMAL LOW (ref 6.5–8.1)

## 2020-05-20 LAB — GLUCOSE, CAPILLARY
Glucose-Capillary: 212 mg/dL — ABNORMAL HIGH (ref 70–99)
Glucose-Capillary: 238 mg/dL — ABNORMAL HIGH (ref 70–99)
Glucose-Capillary: 251 mg/dL — ABNORMAL HIGH (ref 70–99)
Glucose-Capillary: 288 mg/dL — ABNORMAL HIGH (ref 70–99)
Glucose-Capillary: 315 mg/dL — ABNORMAL HIGH (ref 70–99)
Glucose-Capillary: 363 mg/dL — ABNORMAL HIGH (ref 70–99)
Glucose-Capillary: 461 mg/dL — ABNORMAL HIGH (ref 70–99)
Glucose-Capillary: 501 mg/dL (ref 70–99)
Glucose-Capillary: 532 mg/dL (ref 70–99)

## 2020-05-20 LAB — CBC
HCT: 27.3 % — ABNORMAL LOW (ref 36.0–46.0)
Hemoglobin: 9 g/dL — ABNORMAL LOW (ref 12.0–15.0)
MCH: 30.9 pg (ref 26.0–34.0)
MCHC: 33 g/dL (ref 30.0–36.0)
MCV: 93.8 fL (ref 80.0–100.0)
Platelets: 178 10*3/uL (ref 150–400)
RBC: 2.91 MIL/uL — ABNORMAL LOW (ref 3.87–5.11)
RDW: 14.6 % (ref 11.5–15.5)
WBC: 5.3 10*3/uL (ref 4.0–10.5)
nRBC: 0 % (ref 0.0–0.2)

## 2020-05-20 LAB — PROTEIN, BODY FLUID (OTHER): Total Protein, Body Fluid Other: 3.6 g/dL

## 2020-05-20 SURGERY — ESOPHAGOGASTRODUODENOSCOPY (EGD) WITH PROPOFOL
Anesthesia: Monitor Anesthesia Care

## 2020-05-20 MED ORDER — INSULIN REGULAR(HUMAN) IN NACL 100-0.9 UT/100ML-% IV SOLN
INTRAVENOUS | Status: DC
  Administered 2020-05-20: 10 [IU]/h via INTRAVENOUS
  Filled 2020-05-20: qty 100

## 2020-05-20 MED ORDER — SODIUM CHLORIDE 0.9 % IV SOLN: INTRAVENOUS | Status: DC | PRN

## 2020-05-20 MED ORDER — LACTATED RINGERS IV SOLN: INTRAVENOUS | Status: DC

## 2020-05-20 MED ORDER — INSULIN ASPART 100 UNIT/ML ~~LOC~~ SOLN: 0.0000 [IU] | Freq: Three times a day (TID) | SUBCUTANEOUS | Status: DC

## 2020-05-20 MED ORDER — INSULIN ASPART 100 UNIT/ML IV SOLN
6.0000 [IU] | Freq: Once | INTRAVENOUS | Status: AC
  Administered 2020-05-20: 6 [IU] via INTRAVENOUS

## 2020-05-20 MED ORDER — DEXTROSE 50 % IV SOLN
0.0000 mL | INTRAVENOUS | Status: DC | PRN
Start: 2020-05-20 — End: 2020-05-22

## 2020-05-20 MED ORDER — DEXTROSE 50 % IV SOLN
25.0000 mL | Freq: Once | INTRAVENOUS | Status: AC
  Administered 2020-05-20: 25 mL via INTRAVENOUS
  Filled 2020-05-20: qty 50

## 2020-05-20 MED ORDER — DEXTROSE IN LACTATED RINGERS 5 % IV SOLN: INTRAVENOUS | Status: DC

## 2020-05-20 MED ORDER — SODIUM ZIRCONIUM CYCLOSILICATE 10 G PO PACK
10.0000 g | PACK | Freq: Three times a day (TID) | ORAL | Status: DC
  Administered 2020-05-20 – 2020-05-21 (×3): 10 g via ORAL
  Filled 2020-05-20 (×3): qty 1

## 2020-05-20 MED ORDER — PROPOFOL 10 MG/ML IV BOLUS
INTRAVENOUS | Status: DC | PRN
  Administered 2020-05-20 (×2): 10 mg via INTRAVENOUS

## 2020-05-20 MED ORDER — LIDOCAINE 2% (20 MG/ML) 5 ML SYRINGE
INTRAMUSCULAR | Status: DC | PRN
  Administered 2020-05-20: 50 mg via INTRAVENOUS

## 2020-05-20 MED ORDER — PROPOFOL 500 MG/50ML IV EMUL
INTRAVENOUS | Status: DC | PRN
  Administered 2020-05-20: 100 ug/kg/min via INTRAVENOUS

## 2020-05-20 SURGICAL SUPPLY — 15 items

## 2020-05-20 NOTE — Op Note (Signed)
[facility] Patient Name: [name] Procedure Date : 05/20/2020 MRN: [id number] Attending MD: Docia Chuck. Henrene Pastor , MD Date of Birth: 1950/05/12 CSN: [id number] Age: 70 Admit Type: Inpatient Procedure:                Upper GI endoscopy Indications:              Chronic anemia (chronic renal failure) slightly                            worsening, Heme positive stool. Rule out upper GI                            mucosal lesion. Had colonoscopy in 2020 Providers:                Docia Chuck. Henrene Pastor, MD, Vista Lawman, RN, Cherylynn Ridges,                            Technician, Judeth Cornfield, CRNA Referring MD:             [facility] hospitalist Medicines:                Monitored Anesthesia Care Complications:            No immediate complications. Estimated Blood Loss:     Estimated blood loss: none. Procedure:                Pre-Anesthesia Assessment:                           - Prior to the procedure, a History and Physical                            was performed, and patient medications and                            allergies were reviewed. The patient's tolerance of                            previous anesthesia was also reviewed. The risks                            and benefits of the procedure and the sedation                            options and risks were discussed with the patient.                            All questions were answered, and informed consent                            was obtained. Prior Anticoagulants: The patient has                            taken no previous anticoagulant or antiplatelet  agents. ASA Grade Assessment: III - A patient with                            severe systemic disease. After reviewing the risks                            and benefits, the patient was deemed in                            satisfactory condition to undergo the procedure.                           After obtaining informed consent, the  endoscope was                            passed under direct vision. Throughout the                            procedure, the patient's blood pressure, pulse, and                            oxygen saturations were monitored continuously. The                            GIF-H190 ([id number] Olympus gastroscope was                            introduced through the mouth, and advanced to the                            second part of duodenum. The upper GI endoscopy was                            accomplished without difficulty. The patient                            tolerated the procedure well. Scope In: Scope Out: Findings:      The esophagus was normal.      The stomach was normal.      The examined duodenum was normal.      The cardia and gastric fundus were normal on retroflexion. Impression:               - Normal esophagus.                           - Normal stomach.                           - Normal examined duodenum.                           - No specimens collected. Recommendation:           - Patient has a contact number available for  emergencies. The signs and symptoms of potential                            delayed complications were discussed with the                            patient. Return to normal activities tomorrow.                            Written discharge instructions were provided to the                            patient.                           - Resume previous diet.                           - Continue present medications.                           Will sign off. Procedure Code(s):        --- Professional ---                           [phone], Esophagogastroduodenoscopy, flexible,                            transoral; diagnostic, including collection of                            specimen(s) by brushing or washing, when performed                            (separate procedure) Diagnosis Code(s):        --- Professional ---                            D50.0, Iron deficiency anemia secondary to blood                            loss (chronic)                           R19.5, Other fecal abnormalities CPT copyright 2019 American Medical Association. All rights reserved. The codes documented in this report are preliminary and upon coder review may  be revised to meet current compliance requirements. Docia Chuck. Henrene Pastor, MD 05/20/2020 2:55:18 PM This report has been signed electronically. Number of Addenda: 0

## 2020-05-20 NOTE — Progress Notes (Signed)
PROGRESS NOTE        PATIENT DETAILS Name: [name] Age: 70 y.o. Sex: female Date of Birth: 1950-05-24 Admit Date: 05/18/2020 Admitting Physician Eben Burow, MD EQA:[id number], Provider Not In  Brief Narrative: Patient is a 70 y.o. female with history of DM-2, CKD stage IV,, recently admitted at [facility] for GI bleeding-presented with left knee pain, fatigue-was found to have a hemoglobin of 6.6.  She was subsequently admitted to the hospitalist service-see below for further details.  Significant events: 4/25>> admit with fatigue-hemoglobin of 6.6. 4/25>> synovial fluid-WBC 6900, intracellular monosodium urate crystals  Significant studies: 4/25>> x-ray left knee: No acute osseous abnormality.  Antimicrobial therapy: None  Microbiology data: 4/25>> Blood culture: Negative 4/25>> synovial fluid culture: No growth  Procedures : 4/25>> left knee aspiration/arthrocentesis in the emergency room  Consults: GI  DVT Prophylaxis : SCDs Start: 05/19/20 0602   Subjective: Some mild improvement in left knee pain.  No hematochezia or melena.   Assessment/Plan: Probable upper GI bleeding: No hematochezia or melena overnight-GI following-EGD scheduled for later today.  Acute blood loss anemia superimposed on normocytic anemia related to chronic kidney disease: Required 2 units of PRBC transfusion so far-hemoglobin currently stable.  Follow.  AKI on CKD stage IV: Jump in creatinine overnight-need to rule out ongoing GI loss.  Await EGD results.  Avoid nephrotoxic agents.  May need to consult nephrology if no improvement.  Hyperkalemia: start Lokelma-recheck electrolytes tomorrow.  HTN: BP relatively stable-continue minoxidil, clonidine, amlodipine-follow and optimize.  HLD: Continue statin  Left knee swelling/pain-likely acute gout: Continue IV Solu-Medrol-only minimal improvement overnight.  May need Ortho evaluation at  some point.  Diet: Diet Order            Diet NPO time specified  Diet effective midnight                  Code Status: Full code   Family Communication: None at bedside  Disposition Plan: Status is: Inpatient  Remains inpatient appropriate because:Inpatient level of care appropriate due to severity of illness   Dispo: The patient is from: Home              Anticipated d/c is to: Home              Patient currently is not medically stable to d/c.   Difficult to place patient No   Barriers to Discharge: Anemia-possible GI bleeding-awaiting EGD.  Antimicrobial agents: Anti-infectives (From admission, onward)   None       Time spent: 35 minutes-Greater than 50% of this time was spent in counseling, explanation of diagnosis, planning of further management, and coordination of care.  MEDICATIONS: Scheduled Meds: . [MAR Hold] sodium chloride   Intravenous Once  . [MAR Hold] allopurinol  100 mg Oral Daily  . [MAR Hold] amLODipine  10 mg Oral Daily  . [MAR Hold] atorvastatin  80 mg Oral Daily  . [MAR Hold] cloNIDine  0.2 mg Oral TID  . [MAR Hold] feeding supplement  237 mL Oral BID BM  . [MAR Hold] insulin aspart  0-9 Units Subcutaneous Q6H  . [MAR Hold] methylPREDNISolone (SOLU-MEDROL) injection  60 mg Intravenous Daily  . [MAR Hold] minoxidil  10 mg Oral BID  . [MAR Hold] pantoprazole  40 mg Oral Daily   Continuous Infusions: . lactated ringers 75  mL/hr at 05/20/20 1118   PRN Meds:.[MAR Hold] acetaminophen **OR** [MAR Hold] acetaminophen, [MAR Hold] HYDROcodone-acetaminophen, [MAR Hold] ondansetron **OR** [MAR Hold] ondansetron (ZOFRAN) IV   PHYSICAL EXAM: Vital signs: Vitals:   05/19/20 1914 05/19/20 2354 05/20/20 0400 05/20/20 1309  BP: (!) 136/48 (!) 139/49 131/67 (!) 139/39  Pulse: 68  (!) 58 62  Resp: 19 20 20 19   Temp: 98 F (36.7 C) (!) 97.5 F (36.4 C) 98 F (36.7 C)   TempSrc: Oral Oral Oral   SpO2: 94%  94% 96%  Weight:      Height:        Filed Weights   05/19/20 0732 05/19/20 0915  Weight: 63 kg 63 kg   Body mass index is 25.42 kg/m.   Gen Exam:Alert awake-not in any distress HEENT:atraumatic, normocephalic Chest: B/L clear to auscultation anteriorly CVS:S1S2 regular Abdomen:soft non tender, non distended Extremities:no edema Neurology: Non focal Skin: no rash  I have personally reviewed following labs and imaging studies  LABORATORY DATA: CBC: Recent Labs  Lab 05/18/20 1639 05/19/20 0602 05/19/20 1620 05/20/20 0720  WBC 7.0 6.8  --  5.3  NEUTROABS 5.0  --   --   --   HGB 6.6* 7.2* 9.6* 9.0*  HCT 21.4* 21.3* 29.1* 27.3*  MCV 99.5 93.8  --  93.8  PLT 154 144*  --  540    Basic Metabolic Panel: Recent Labs  Lab 05/18/20 1639 05/19/20 0602 05/20/20 0720  NA 136 138 133*  K 4.5 5.0 5.8*  CL 101 107 104  CO2 20* 19* 20*  GLUCOSE 169* 47* 297*  BUN 72* 76* 91*  CREATININE 3.78* 3.74* 4.37*  CALCIUM 8.2* 8.1* 8.2*    GFR: Estimated Creatinine Clearance: 10.5 mL/min (A) (by C-G formula based on SCr of 4.37 mg/dL (H)).  Liver Function Tests: Recent Labs  Lab 05/20/20 0720  AST 16  ALT 24  ALKPHOS 74  BILITOT 1.2  PROT 6.2*  ALBUMIN 2.7*   No results for input(s): LIPASE, AMYLASE in the last 168 hours. No results for input(s): AMMONIA in the last 168 hours.  Coagulation Profile: No results for input(s): INR, PROTIME in the last 168 hours.  Cardiac Enzymes: No results for input(s): CKTOTAL, CKMB, CKMBINDEX, TROPONINI in the last 168 hours.  BNP (last 3 results) No results for input(s): PROBNP in the last 8760 hours.  Lipid Profile: No results for input(s): CHOL, HDL, LDLCALC, TRIG, CHOLHDL, LDLDIRECT in the last 72 hours.  Thyroid Function Tests: No results for input(s): TSH, T4TOTAL, FREET4, T3FREE, THYROIDAB in the last 72 hours.  Anemia Panel: No results for input(s): VITAMINB12, FOLATE, FERRITIN, TIBC, IRON, RETICCTPCT in the last 72 hours.  Urine analysis:     Component Value Date/Time   COLORURINE YELLOW 11/27/2019 2136   APPEARANCEUR HAZY (A) 11/27/2019 2136   LABSPEC 1.011 11/27/2019 2136   PHURINE 6.0 11/27/2019 2136   GLUCOSEU 150 (A) 11/27/2019 2136   GLUCOSEU NEGATIVE 09/04/2009 0721   HGBUR SMALL (A) 11/27/2019 2136   BILIRUBINUR NEGATIVE 11/27/2019 2136   BILIRUBINUR negative 09/24/2019 1003   KETONESUR NEGATIVE 11/27/2019 2136   PROTEINUR >=300 (A) 11/27/2019 2136   UROBILINOGEN 0.2 09/24/2019 1003   UROBILINOGEN 0.2 09/04/2009 0721   NITRITE NEGATIVE 11/27/2019 2136   LEUKOCYTESUR NEGATIVE 11/27/2019 2136    Sepsis Labs: Lactic Acid, Venous    Component Value Date/Time   LATICACIDVEN 1.2 05/18/2020 2028    MICROBIOLOGY: Recent Results (from the past 240 hour(s))  Blood culture (routine  x 2)     Status: None (Preliminary result)   Collection Time: 05/18/20  6:45 PM   Specimen: BLOOD  Result Value Ref Range Status   Specimen Description BLOOD RIGHT ARM  Final   Special Requests   Final    BOTTLES DRAWN AEROBIC AND ANAEROBIC Blood Culture results may not be optimal due to an inadequate volume of blood received in culture bottles   Culture   Final    NO GROWTH 2 DAYS Performed at [facility], [place] [address], [place], [place] [address]    Report Status PENDING  Incomplete  Body fluid culture w Gram Stain     Status: None (Preliminary result)   Collection Time: 05/18/20  9:02 PM   Specimen: Body Fluid  Result Value Ref Range Status   Specimen Description FLUID SYNOVIAL  Final   Special Requests NONE  Final   Gram Stain   Final    ABUNDANT WBC PRESENT, PREDOMINANTLY PMN NO ORGANISMS SEEN    Culture   Final    NO GROWTH 2 DAYS Performed at [facility], [address], [place], [place] [address]    Report Status PENDING  Incomplete  Resp Panel by RT-PCR (Flu A&B, Covid) Nasopharyngeal Swab     Status: None   Collection Time: 05/19/20  5:27 AM   Specimen: Nasopharyngeal Swab; Nasopharyngeal(NP) swabs  in vial transport medium  Result Value Ref Range Status   SARS Coronavirus 2 by RT PCR NEGATIVE NEGATIVE Final    Comment: (NOTE) SARS-CoV-2 target nucleic acids are NOT DETECTED.  The SARS-CoV-2 RNA is generally detectable in upper respiratory specimens during the acute phase of infection. The lowest concentration of SARS-CoV-2 viral copies this assay can detect is 138 copies/mL. A negative result does not preclude SARS-Cov-2 infection and should not be used as the sole basis for treatment or other patient management decisions. A negative result may occur with  improper specimen collection/handling, submission of specimen other than nasopharyngeal swab, presence of viral mutation(s) within the areas targeted by this assay, and inadequate number of viral copies(<138 copies/mL). A negative result must be combined with clinical observations, patient history, and epidemiological information. The expected result is Negative.  Fact Sheet for Patients:  EntrepreneurPulse.com.au  Fact Sheet for Healthcare Providers:  IncredibleEmployment.be  This test is no t yet approved or cleared by the [place] FDA and  has been authorized for detection and/or diagnosis of SARS-CoV-2 by FDA under an Emergency Use Authorization (EUA). This EUA will remain  in effect (meaning this test can be used) for the duration of the COVID-19 declaration under Section 564(b)(1) of the Act, 21 U.S.C.section 360bbb-3(b)(1), unless the authorization is terminated  or revoked sooner.       Influenza A by PCR NEGATIVE NEGATIVE Final   Influenza B by PCR NEGATIVE NEGATIVE Final    Comment: (NOTE) The Xpert Xpress SARS-CoV-2/FLU/RSV plus assay is intended as an aid in the diagnosis of influenza from Nasopharyngeal swab specimens and should not be used as a sole basis for treatment. Nasal washings and aspirates are unacceptable for Xpert Xpress  SARS-CoV-2/FLU/RSV testing.  Fact Sheet for Patients: EntrepreneurPulse.com.au  Fact Sheet for Healthcare Providers: IncredibleEmployment.be  This test is not yet approved or cleared by the [place] FDA and has been authorized for detection and/or diagnosis of SARS-CoV-2 by FDA under an Emergency Use Authorization (EUA). This EUA will remain in effect (meaning this test can be used) for the duration of the COVID-19  declaration under Section 564(b)(1) of the Act, 21 U.S.C. section 360bbb-3(b)(1), unless the authorization is terminated or revoked.  Performed at [facility], [place] [address], [place], [place] [address]     RADIOLOGY STUDIES/RESULTS: DG Knee Complete 4 Views Left  Result Date: 05/18/2020 CLINICAL DATA:  Knee pain, no known trauma EXAM: LEFT KNEE - COMPLETE 4+ VIEW COMPARISON:  December 01, 2019 FINDINGS: No evidence of fracture or dislocation. Small joint effusion. 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 Superior patellar pole again visualized. Vascular calcifications. IMPRESSION: No acute osseous abnormality visualized. 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 Small joint effusion. Electronically Signed   By: Dahlia Bailiff MD   On: 05/18/2020 17:04     LOS: 2 days   Oren Binet, MD  [facility]    To contact the attending provider between 7A-7P or the covering provider during after hours 7P-7A, please log into the web site [email or web address] and access using universal [place] password for that web site. If you do not have the password, please call the hospital operator.  05/20/2020, 2:27 PM

## 2020-05-20 NOTE — Anesthesia Preprocedure Evaluation (Addendum)
Anesthesia Evaluation  Patient identified by MRN, date of birth, ID band Patient awake    Reviewed: Allergy & Precautions, NPO status , Patient's Chart, lab work & pertinent test results  Airway Mallampati: II  TM Distance: >3 FB Neck ROM: Full    Dental  (+) Edentulous Upper, Edentulous Lower   Pulmonary shortness of breath and with exertion, former smoker,  Former smoker, quit 2016, 18 pack year history    Pulmonary exam normal breath sounds clear to auscultation       Cardiovascular hypertension, + Peripheral Vascular Disease  Normal cardiovascular exam Rhythm:Regular Rate:Normal     Neuro/Psych CVA, Residual Symptoms negative neurological ROS  negative psych ROS   GI/Hepatic Neg liver ROS, PUD, GERD  Controlled and Medicated,  Endo/Other  diabetes, Well Controlled, Type 2Last a1c 6.1, FS>200   Renal/GU ESRFRenal diseaseKnown CKD 4, worsening Cr up to 4.3 today, CKD 5  negative genitourinary   Musculoskeletal negative musculoskeletal ROS (+) Arthritis , Osteoarthritis,    Abdominal   Peds negative pediatric ROS (+)  Hematology negative hematology ROS (+) Blood dyscrasia, anemia , Anemia 2/2 GIB hct 27.3   Anesthesia Other Findings   Reproductive/Obstetrics negative OB ROS                            Anesthesia Physical Anesthesia Plan  ASA: III  Anesthesia Plan: MAC   Post-op Pain Management:    Induction:   PONV Risk Score and Plan: 2 and Propofol infusion and TIVA  Airway Management Planned: Natural Airway and Simple Face Mask  Additional Equipment: None  Intra-op Plan:   Post-operative Plan:   Informed Consent: I have reviewed the patients History and Physical, chart, labs and discussed the procedure including the risks, benefits and alternatives for the proposed anesthesia with the patient or authorized representative who has indicated his/her understanding and  acceptance.       Plan Discussed with: CRNA  Anesthesia Plan Comments:         Anesthesia Quick Evaluation

## 2020-05-20 NOTE — Transfer of Care (Signed)
Immediate Anesthesia Transfer of Care Note  Patient: [name]  Procedure(s) Performed: ESOPHAGOGASTRODUODENOSCOPY (EGD) WITH PROPOFOL (N/A )  Patient Location: PACU and Endoscopy Unit  Anesthesia Type:MAC  Level of Consciousness: patient cooperative and responds to stimulation  Airway & Oxygen Therapy: Patient Spontanous Breathing  Post-op Assessment: Report given to RN and Post -op Vital signs reviewed and stable  Post vital signs: Reviewed and stable  Last Vitals:  Vitals Value Taken Time  BP 113/38 05/20/20 1453  Temp    Pulse 54 05/20/20 1454  Resp 21 05/20/20 1454  SpO2 100 % 05/20/20 1454  Vitals shown include unvalidated device data.  Last Pain:  Vitals:   05/20/20 1309  TempSrc:   PainSc: 0-No pain      Patients Stated Pain Goal: 7 (02/02/01 4961)  Complications: No complications documented.

## 2020-05-20 NOTE — Progress Notes (Signed)
Inpatient Diabetes Program Recommendations  AACE/ADA: New Consensus Statement on Inpatient Glycemic Control (2015)  Target Ranges:  Prepandial:   less than 140 mg/dL      Peak postprandial:   less than 180 mg/dL (1-2 hours)      Critically ill patients:  140 - 180 mg/dL   Lab Results  Component Value Date   GLUCAP 288 (H) 05/20/2020   HGBA1C 6.1 (A) 04/23/2020    Review of Glycemic Control Results for [name], [name] (MRN [id number]) as of 05/20/2020 09:34  Ref. Range 05/19/2020 07:31 05/19/2020 11:38 05/19/2020 12:10 05/19/2020 20:50 05/20/2020 01:47 05/20/2020 07:56  Glucose-Capillary Latest Ref Range: 70 - 99 mg/dL 95 67 (L) 125 (H) 226 (H) 315 (H) 288 (H)   Diabetes history: DM 2 Outpatient Diabetes medications: Prandin 2 mg tid Current orders for Inpatient glycemic control:  Novolog 0-9 units Q6 hours  IV Solumedrol 60 mg Daily Ensure Enlive bid between meals CKD  Inpatient Diabetes Program Recommendations:    Hypoglycemia prior to steroids, no insulin given prior to hypoglycemia. Now in the upper 200 range.   -  Consider Levemir 8 units  Thanks,  Tama Headings RN, MSN, BC-ADM Inpatient Diabetes Coordinator Team Pager [phone] (8a-5p)

## 2020-05-20 NOTE — Anesthesia Postprocedure Evaluation (Signed)
Anesthesia Post Note  Patient: LINCY BELLES  Procedure(s) Performed: ESOPHAGOGASTRODUODENOSCOPY (EGD) WITH PROPOFOL (N/A )     Patient location during evaluation: PACU Anesthesia Type: MAC Level of consciousness: awake and alert Pain management: pain level controlled Vital Signs Assessment: post-procedure vital signs reviewed and stable Respiratory status: spontaneous breathing, nonlabored ventilation and respiratory function stable Cardiovascular status: blood pressure returned to baseline and stable Postop Assessment: no apparent nausea or vomiting Anesthetic complications: no   No complications documented.  Last Vitals:  Vitals:   05/20/20 1520 05/20/20 1539  BP: (!) 133/34 (!) 137/54  Pulse: (!) 57 60  Resp: 15 18  Temp:  36.6 C  SpO2: 95% 99%    Last Pain:  Vitals:   05/20/20 1539  TempSrc: Oral  PainSc:                  Pervis Hocking

## 2020-05-20 NOTE — Progress Notes (Signed)
Initial Nutrition Assessment  DOCUMENTATION CODES:   Not applicable  INTERVENTION:    Ensure Enlive po BID, each supplement provides 350 kcal and 20 grams of protein  NUTRITION DIAGNOSIS:   Inadequate oral intake related to altered GI function as evidenced by per patient/family report.  GOAL:   Patient will meet greater than or equal to 90% of their needs  MONITOR:   PO intake,Supplement acceptance,Labs  REASON FOR ASSESSMENT:   Malnutrition Screening Tool    ASSESSMENT:   70 yo female admitted with worsening L knee pain and fatigue, acute on chronic anemia, probable GI bleed. PMH includes HTN, DM-2, CKD stage IV, anemia, recent GIB.   Possible gout in left knee, treating with IV solumedrol and allopurinol.  NPO for EGD this morning. Results were WNL. Diet has been advanced to renal.   Patient reports that she has been eating poorly for the past few weeks. She used to weigh ~155 lbs "years ago." Currently 139 lbs. She is willing to try Ensure supplements when her diet is advanced after procedure today. She's had one meal since admission, dinner yesterday, she ate ~50%.  Patient with some mild depletion of muscle mass, likely related to recent decreased activity and reduced appetite and intake.    Current weight 63 kg. Usual weight 62.1 kg 6 months ago. No significant weight changes per documented weight history.   Labs reviewed. Na 133, K 5.8 CBG: 288-251-212  Medications reviewed and include novolog, solumedrol, Lokelma. IVF: LR at 75 ml/h.  NUTRITION - FOCUSED PHYSICAL EXAM:  Flowsheet Row Most Recent Value  Orbital Region No depletion  Upper Arm Region No depletion  Thoracic and Lumbar Region No depletion  Buccal Region No depletion  Temple Region No depletion  Clavicle Bone Region No depletion  Clavicle and Acromion Bone Region No depletion  Scapular Bone Region No depletion  Dorsal Hand Mild depletion  Patellar Region Mild depletion  Anterior Thigh  Region Mild depletion  Posterior Calf Region Mild depletion  Edema (RD Assessment) None  Hair Reviewed  Eyes Reviewed  Mouth Reviewed  Skin Reviewed  Nails Reviewed       Diet Order:   Diet Order            Diet renal with fluid restriction Fluid restriction: 1200 mL Fluid; Room service appropriate? Yes; Fluid consistency: Thin  Diet effective now                 EDUCATION NEEDS:   Not appropriate for education at this time  Skin:  Skin Assessment: Reviewed RN Assessment  Last BM:  no BM documented  Height:   Ht Readings from Last 1 Encounters:  05/19/20 5\' 2"  (1.575 m)    Weight:   Wt Readings from Last 1 Encounters:  05/19/20 63 kg    Ideal Body Weight:  50 kg  BMI:  Body mass index is 25.42 kg/m.  Estimated Nutritional Needs:   Kcal:  1600-1800  Protein:  75-85 gm  Fluid:  1.6-1.8 L    Lucas Mallow, RD, LDN, CNSC Please refer to Amion for contact information.

## 2020-05-20 NOTE — Progress Notes (Signed)
[facility] night shift.  The staff reported that the patient's CBG was 501 mg/dL.  Her most recent BMP showed a glucose of 525 mg/dL, CO2 18 with an anion gap of 12.  The patient is receiving methylprednisolone.  I will begin an insulin infusion per Endo tool.  Tennis Must, MD.

## 2020-05-20 NOTE — Interval H&P Note (Signed)
History and Physical Interval Note:  05/20/2020 1:14 PM  [name]  has presented today for surgery, with the diagnosis of Anemia.  Black, FOBT positive stool..  The various methods of treatment have been discussed with the patient and family. After consideration of risks, benefits and other options for treatment, the patient has consented to  Procedure(s): ESOPHAGOGASTRODUODENOSCOPY (EGD) WITH PROPOFOL (N/A) as a surgical intervention.  The patient's history has been reviewed, patient examined, no change in status, stable for surgery.  I have reviewed the patient's chart and labs.  Questions were answered to the patient's satisfaction.     Scarlette Shorts

## 2020-05-20 NOTE — TOC Initial Note (Signed)
Transition of Care [facility]) - Initial/Assessment Note    Patient Details  Name: [name] MRN: [id number] Date of Birth: 10-17-1950  Transition of [facility]) CM/SW Contact:    Maria Collet, RN Phone Number: 05/20/2020, 11:29 AM  Clinical Narrative:          [name] w patient at bedside. She states that she is independent from home and wants to return to home at time of DC. She shares that she went to a SNF once in the past and she is not interested in going to one again.  She has a RW and cane at home does she does not use but has available if needed. She states that her sister lives one house away and her son lives about 2 miles away and both are available to her if needed for assistance. She denies barriers with transportation, or access to PCP.  Patient encouraged to ambulate with assist of staff as leg pain allows.           Expected Discharge Plan: Home/Self Care Barriers to Discharge: Continued Medical Work up   Patient Goals and CMS Choice Patient states their goals for this hospitalization and ongoing recovery are:: to go home      Expected Discharge Plan and Services Expected Discharge Plan: Home/Self Care   Discharge Planning Services: CM Consult   Living arrangements for the past 2 months: Single Family Home                                      Prior Living Arrangements/Services Living arrangements for the past 2 months: Single Family Home Lives with:: Self              Current home services: DME    Activities of Daily Living Home Assistive Devices/Equipment: Environmental consultant (specify type),Cane (specify quad or straight) ADL Screening (condition at time of admission) Patient's cognitive ability adequate to safely complete daily activities?: Yes Is the patient deaf or have difficulty hearing?: No Does the patient have difficulty seeing, even when wearing glasses/contacts?: No Does the patient have difficulty concentrating, remembering, or making  decisions?: No Patient able to express need for assistance with ADLs?: Yes Does the patient have difficulty dressing or bathing?: No Independently performs ADLs?: Yes (appropriate for developmental age) Does the patient have difficulty walking or climbing stairs?: Yes Weakness of Legs: Both Weakness of Arms/Hands: None  Permission Sought/Granted                  Emotional Assessment              Admission diagnosis:  Blood loss anemia [D50.0] GI bleed [K92.2] Gastrointestinal hemorrhage, unspecified gastrointestinal hemorrhage type [K92.2] Acute gout of left knee, unspecified cause [M10.9] Patient Active Problem List   Diagnosis Date Noted  . GI bleed 05/18/2020  . CKD (chronic kidney disease) stage 3, GFR 30-59 ml/min (HCC) 05/18/2020  . Knee pain 05/18/2020  . Right flank discomfort 11/28/2019  . Vitamin D toxicity, accidental or unintentional, initial encounter 09/09/2017  . Ataxia 09/09/2017  . Type 2 diabetes mellitus with hyperglycemia ([place]) 09/09/2017  . Chronic kidney disease (CKD)   . Olecranon bursitis of right elbow 03/09/2017  . Thyroid nodule 06/29/2016  . 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 Vitamin D deficiency 01/12/2016  . Medicare annual wellness  visit, initial 01/11/2016  . Lacunar infarct, acute ([place]) 03/23/2015  . Non compliance w medication regimen 01/16/2015  . Anemia 01/08/2015  . Stroke ([place]) 01/08/2015  . Hyperlipemia   . Carotid bruit present 12/13/2012  . Routine health maintenance 10/24/2010  . PERIPHERAL EDEMA 09/10/2009  . ELECTROCARDIOGRAM, ABNORMAL 04/11/2008  . HEART MURMUR, SYSTOLIC 16/10/9602  . SEBACEOUS CYST, NECK 12/13/2006  . Hyperthyroidism 09/27/2006  . Secondary diabetes with peripheral vascular disease ([place]) 09/27/2006  . Hyperlipidemia LDL goal <70 09/27/2006  . TOBACCO ABUSE 09/27/2006  .  Essential hypertension 09/27/2006   PCP:  System, Provider Not In Pharmacy:   [id number] - [place], [place] [address] [place] [address] Phone: [phone] Fax: [phone]  [place], [place], [place] [address], [place] [address] Phone: [phone] Fax: [phone]     Social Determinants of Health (SDOH) Interventions    Readmission Risk Interventions Readmission Risk Prevention Plan 05/20/2020  Transportation Screening Complete  HRI or Home Care Consult Complete  Social Work Consult for [place] Planning/Counseling Complete  Palliative Care Screening Not Applicable  Some recent data might be hidden

## 2020-05-21 ENCOUNTER — Encounter (HOSPITAL_COMMUNITY): Payer: Self-pay | Admitting: Internal Medicine

## 2020-05-21 LAB — CBC
HCT: 28 % — ABNORMAL LOW (ref 36.0–46.0)
Hemoglobin: 9 g/dL — ABNORMAL LOW (ref 12.0–15.0)
MCH: 29.9 pg (ref 26.0–34.0)
MCHC: 32.1 g/dL (ref 30.0–36.0)
MCV: 93 fL (ref 80.0–100.0)
Platelets: 218 10*3/uL (ref 150–400)
RBC: 3.01 MIL/uL — ABNORMAL LOW (ref 3.87–5.11)
RDW: 14.5 % (ref 11.5–15.5)
WBC: 8.7 10*3/uL (ref 4.0–10.5)
nRBC: 0 % (ref 0.0–0.2)

## 2020-05-21 LAB — GLUCOSE, CAPILLARY
Glucose-Capillary: 147 mg/dL — ABNORMAL HIGH (ref 70–99)
Glucose-Capillary: 157 mg/dL — ABNORMAL HIGH (ref 70–99)
Glucose-Capillary: 160 mg/dL — ABNORMAL HIGH (ref 70–99)
Glucose-Capillary: 168 mg/dL — ABNORMAL HIGH (ref 70–99)
Glucose-Capillary: 183 mg/dL — ABNORMAL HIGH (ref 70–99)
Glucose-Capillary: 186 mg/dL — ABNORMAL HIGH (ref 70–99)
Glucose-Capillary: 189 mg/dL — ABNORMAL HIGH (ref 70–99)
Glucose-Capillary: 195 mg/dL — ABNORMAL HIGH (ref 70–99)
Glucose-Capillary: 199 mg/dL — ABNORMAL HIGH (ref 70–99)
Glucose-Capillary: 202 mg/dL — ABNORMAL HIGH (ref 70–99)
Glucose-Capillary: 209 mg/dL — ABNORMAL HIGH (ref 70–99)
Glucose-Capillary: 213 mg/dL — ABNORMAL HIGH (ref 70–99)
Glucose-Capillary: 228 mg/dL — ABNORMAL HIGH (ref 70–99)
Glucose-Capillary: 278 mg/dL — ABNORMAL HIGH (ref 70–99)
Glucose-Capillary: 295 mg/dL — ABNORMAL HIGH (ref 70–99)
Glucose-Capillary: 376 mg/dL — ABNORMAL HIGH (ref 70–99)
Glucose-Capillary: 412 mg/dL — ABNORMAL HIGH (ref 70–99)

## 2020-05-21 LAB — BASIC METABOLIC PANEL
Anion gap: 12 (ref 5–15)
BUN: 104 mg/dL — ABNORMAL HIGH (ref 8–23)
CO2: 20 mmol/L — ABNORMAL LOW (ref 22–32)
Calcium: 8.4 mg/dL — ABNORMAL LOW (ref 8.9–10.3)
Chloride: 100 mmol/L (ref 98–111)
Creatinine, Ser: 4.34 mg/dL — ABNORMAL HIGH (ref 0.44–1.00)
GFR, Estimated: 10 mL/min — ABNORMAL LOW (ref >60)
Glucose, Bld: 149 mg/dL — ABNORMAL HIGH (ref 70–99)
Potassium: 4.9 mmol/L (ref 3.5–5.1)
Sodium: 132 mmol/L — ABNORMAL LOW (ref 135–145)

## 2020-05-21 LAB — RENAL FUNCTION PANEL
Albumin: 2.7 g/dL — ABNORMAL LOW (ref 3.5–5.0)
Anion gap: 12 (ref 5–15)
BUN: 102 mg/dL — ABNORMAL HIGH (ref 8–23)
CO2: 19 mmol/L — ABNORMAL LOW (ref 22–32)
Calcium: 8.1 mg/dL — ABNORMAL LOW (ref 8.9–10.3)
Chloride: 99 mmol/L (ref 98–111)
Creatinine, Ser: 4.54 mg/dL — ABNORMAL HIGH (ref 0.44–1.00)
GFR, Estimated: 10 mL/min — ABNORMAL LOW (ref >60)
Glucose, Bld: 370 mg/dL — ABNORMAL HIGH (ref 70–99)
Phosphorus: 6.6 mg/dL — ABNORMAL HIGH (ref 2.5–4.6)
Potassium: 5.1 mmol/L (ref 3.5–5.1)
Sodium: 130 mmol/L — ABNORMAL LOW (ref 135–145)

## 2020-05-21 MED ORDER — INSULIN GLARGINE 100 UNIT/ML ~~LOC~~ SOLN
15.0000 [IU] | Freq: Every day | SUBCUTANEOUS | Status: DC
  Administered 2020-05-21 – 2020-05-22 (×2): 15 [IU] via SUBCUTANEOUS
  Filled 2020-05-21 (×2): qty 0.15

## 2020-05-21 MED ORDER — PREDNISONE 20 MG PO TABS
40.0000 mg | ORAL_TABLET | Freq: Every day | ORAL | Status: DC
  Administered 2020-05-21: 40 mg via ORAL
  Filled 2020-05-21: qty 2

## 2020-05-21 MED ORDER — INSULIN ASPART 100 UNIT/ML IJ SOLN
0.0000 [IU] | Freq: Three times a day (TID) | INTRAMUSCULAR | Status: DC
  Administered 2020-05-21: 2 [IU] via SUBCUTANEOUS
  Administered 2020-05-22: 7 [IU] via SUBCUTANEOUS
  Administered 2020-05-22: 9 [IU] via SUBCUTANEOUS

## 2020-05-21 NOTE — TOC Transition Note (Signed)
Transition of Care [facility]) - CM/SW Discharge Note   Patient Details  Name: [name] MRN: [id number] Date of Birth: 12/21/1950  Transition of Care [facility]) CM/SW Contact:  Verdell Carmine, RN Phone Number: 05/21/2020, 8:33 AM   Clinical Narrative:    EGD without cause of bleeding detected, iron deficiency anemia, stage IV renal disease. Creatinine 4.8 patient should be DC today. Will need  follow up with PCP.Has DME at home if needed.    Final next level of care: Home/Self Care Barriers to Discharge: No Barriers Identified   Patient Goals and CMS Choice Patient states their goals for this hospitalization and ongoing recovery are:: to go home      Discharge Placement                       Discharge Plan and Services   Discharge Planning Services: CM Consult                                 Social Determinants of Health (SDOH) Interventions     Readmission Risk Interventions Readmission Risk Prevention Plan 05/20/2020  Transportation Screening Complete  HRI or [place] Complete  Social Work Consult for [place] Planning/Counseling Complete  Palliative Care Screening Not Applicable  Some recent data might be hidden

## 2020-05-21 NOTE — Progress Notes (Signed)
PROGRESS NOTE        PATIENT DETAILS Name: [name] Age: 70 y.o. Sex: female Date of Birth: [birth year] Admit Date: 05/18/2020 Admitting Physician Eben Burow, MD UJW:[id number], Provider Not In  Brief Narrative: Patient is a 70 y.o. female with history of DM-2, CKD stage IV,, recently admitted at [facility] for GI bleeding-presented with left knee pain, fatigue-was found to have a hemoglobin of 6.6.  She was subsequently admitted to the hospitalist service-see below for further details.  Significant events: 4/25>> admit with fatigue-hemoglobin of 6.6. 4/25>> synovial fluid-WBC 6900, intracellular monosodium urate crystals  Significant studies: 4/25>> x-ray left knee: No acute osseous abnormality.  Antimicrobial therapy: None  Microbiology data: 4/25>> Blood culture: Negative 4/25>> synovial fluid culture: No growth  Procedures : 4/25>> left knee aspiration/arthrocentesis in the emergency room 4/27>> EGD: Normal stomach/esophagus/duodenum  Consults: GI  DVT Prophylaxis : SCDs Start: 05/20/20 2255 SCDs Start: 05/19/20 0602   Subjective: No BM for the past 2 days-left knee pain is much better.  Assessment/Plan: Probable upper GI bleeding: Seems to have resolved-no BM for almost 2 days.  Hemoglobin stable.  EGD without any bleeding foci.  Acute blood loss anemia superimposed on normocytic anemia related to chronic kidney disease: Required 2 units of PRBC transfusion so far-hemoglobin currently stable.  Follow.  AKI on CKD stage IV: Suspect AKI was due to osmotic diuresis in the setting of hyperglycemia due to steroid use.  Creatinine has stabilized-but still not yet at baseline-continue supportive care-avoid nephrotoxic agents.  Will consult nephrology if significant electrolyte derangements occur.  Hyperkalemia: Resolved-likely was due to worsening AKI.  HTN: BP relatively stable-continue minoxidil, clonidine,  amlodipine-follow and optimize.  DM-2 (A1c 6.1 on 3/31) with uncontrolled hyperglycemia due to steroid use: Had severe hyperglycemia last night-started on IV insulin infusion-will place patient on 15 units of Lantus and SSI.  Follow and adjust.  HLD: Continue statin  Left knee swelling/pain-likely acute gout: Significant improvement today-hardly any tenderness-change from Solu-Medrol to prednisone.   Diet: Diet Order            Diet renal with fluid restriction Fluid restriction: 1200 mL Fluid; Room service appropriate? Yes; Fluid consistency: Thin  Diet effective now                  Code Status: Full code   Family Communication: None at bedside-called Sister-Faye King-[phone]-on 4/28  Disposition Plan: Status is: Inpatient  Remains inpatient appropriate because:Inpatient level of care appropriate due to severity of illness   Dispo: The patient is from: Home              Anticipated d/c is to: Home              Patient currently is not medically stable to d/c.   Difficult to place patient No   Barriers to Discharge: Uncontrolled hyperglycemia-on IV insulin-transitioning to SQ insulin today.  Antimicrobial agents: Anti-infectives (From admission, onward)   None       Time spent: 25 minutes-Greater than 50% of this time was spent in counseling, explanation of diagnosis, planning of further management, and coordination of care.  MEDICATIONS: Scheduled Meds: . sodium chloride   Intravenous Once  . allopurinol  100 mg Oral Daily  . amLODipine  10 mg Oral Daily  . atorvastatin  80 mg Oral Daily  .  cloNIDine  0.2 mg Oral TID  . feeding supplement  237 mL Oral BID BM  . insulin aspart  0-9 Units Subcutaneous TID WC  . insulin glargine  15 Units Subcutaneous Daily  . minoxidil  10 mg Oral BID  . pantoprazole  40 mg Oral Daily  . predniSONE  40 mg Oral Q breakfast  . sodium zirconium cyclosilicate  10 g Oral TID   Continuous Infusions: . dextrose 5%  lactated ringers 125 mL/hr at 05/21/20 1112  . insulin 4.2 Units/hr (05/21/20 1115)  . lactated ringers Stopped (05/21/20 0307)  . lactated ringers Stopped (05/21/20 0307)   PRN Meds:.acetaminophen **OR** acetaminophen, dextrose, ondansetron **OR** ondansetron (ZOFRAN) IV   PHYSICAL EXAM: Vital signs: Vitals:   05/20/20 2039 05/21/20 0000 05/21/20 0400 05/21/20 0812  BP: (!) 129/52 (!) 135/49 (!) 128/52 (!) 121/49  Pulse: 66 62 (!) 54 (!) 55  Resp: 19 20 10 16   Temp: 98 F (36.7 C) 98 F (36.7 C) 98.1 F (36.7 C) 98 F (36.7 C)  TempSrc: Oral Oral Oral Oral  SpO2: 93% 95% 96% 97%  Weight:      Height:       Filed Weights   05/19/20 0732 05/19/20 0915  Weight: 63 kg 63 kg   Body mass index is 25.42 kg/m.  Gen Exam:Alert awake-not in any distress HEENT:atraumatic, normocephalic Chest: B/L clear to auscultation anteriorly CVS:S1S2 regular Abdomen:soft non tender, non distended Extremities:no edema Neurology: Non focal Skin: no rash  I have personally reviewed following labs and imaging studies  LABORATORY DATA: CBC: Recent Labs  Lab 05/18/20 1639 05/19/20 0602 05/19/20 1620 05/20/20 0720 05/21/20 0056  WBC 7.0 6.8  --  5.3 8.7  NEUTROABS 5.0  --   --   --   --   HGB 6.6* 7.2* 9.6* 9.0* 9.0*  HCT 21.4* 21.3* 29.1* 27.3* 28.0*  MCV 99.5 93.8  --  93.8 93.0  PLT 154 144*  --  178 706    Basic Metabolic Panel: Recent Labs  Lab 05/19/20 0602 05/20/20 0720 05/20/20 2026 05/21/20 0056 05/21/20 0715  NA 138 133* 129* 130* 132*  K 5.0 5.8* 5.6* 5.1 4.9  CL 107 104 99 99 100  CO2 19* 20* 18* 19* 20*  GLUCOSE 47* 297* 525* 370* 149*  BUN 76* 91* 101* 102* 104*  CREATININE 3.74* 4.37* 4.45* 4.54* 4.34*  CALCIUM 8.1* 8.2* 7.9* 8.1* 8.4*  PHOS  --   --   --  6.6*  --     GFR: Estimated Creatinine Clearance: 10.5 mL/min (A) (by C-G formula based on SCr of 4.34 mg/dL (H)).  Liver Function Tests: Recent Labs  Lab 05/20/20 0720 05/21/20 0056  AST 16   --   ALT 24  --   ALKPHOS 74  --   BILITOT 1.2  --   PROT 6.2*  --   ALBUMIN 2.7* 2.7*   No results for input(s): LIPASE, AMYLASE in the last 168 hours. No results for input(s): AMMONIA in the last 168 hours.  Coagulation Profile: No results for input(s): INR, PROTIME in the last 168 hours.  Cardiac Enzymes: No results for input(s): CKTOTAL, CKMB, CKMBINDEX, TROPONINI in the last 168 hours.  BNP (last 3 results) No results for input(s): PROBNP in the last 8760 hours.  Lipid Profile: No results for input(s): CHOL, HDL, LDLCALC, TRIG, CHOLHDL, LDLDIRECT in the last 72 hours.  Thyroid Function Tests: No results for input(s): TSH, T4TOTAL, FREET4, T3FREE, THYROIDAB in the last 72  hours.  Anemia Panel: No results for input(s): VITAMINB12, FOLATE, FERRITIN, TIBC, IRON, RETICCTPCT in the last 72 hours.  Urine analysis:    Component Value Date/Time   COLORURINE YELLOW 11/27/2019 2136   APPEARANCEUR HAZY (A) 11/27/2019 2136   LABSPEC 1.011 11/27/2019 2136   PHURINE 6.0 11/27/2019 2136   GLUCOSEU 150 (A) 11/27/2019 2136   GLUCOSEU NEGATIVE 09/04/2009 0721   HGBUR SMALL (A) 11/27/2019 2136   BILIRUBINUR NEGATIVE 11/27/2019 2136   BILIRUBINUR negative 09/24/2019 1003   KETONESUR NEGATIVE 11/27/2019 2136   PROTEINUR >=300 (A) 11/27/2019 2136   UROBILINOGEN 0.2 09/24/2019 1003   UROBILINOGEN 0.2 09/04/2009 0721   NITRITE NEGATIVE 11/27/2019 2136   LEUKOCYTESUR NEGATIVE 11/27/2019 2136    Sepsis Labs: Lactic Acid, Venous    Component Value Date/Time   LATICACIDVEN 1.2 05/18/2020 2028    MICROBIOLOGY: Recent Results (from the past 240 hour(s))  Blood culture (routine x 2)     Status: None (Preliminary result)   Collection Time: 05/18/20  6:45 PM   Specimen: BLOOD  Result Value Ref Range Status   Specimen Description BLOOD RIGHT ARM  Final   Special Requests   Final    BOTTLES DRAWN AEROBIC AND ANAEROBIC Blood Culture results may not be optimal due to an inadequate  volume of blood received in culture bottles   Culture   Final    NO GROWTH 3 DAYS Performed at [facility], [place] [address] [place], [place] [address]    Report Status PENDING  Incomplete  Body fluid culture w Gram Stain     Status: None (Preliminary result)   Collection Time: 05/18/20  9:02 PM   Specimen: Body Fluid  Result Value Ref Range Status   Specimen Description FLUID SYNOVIAL  Final   Special Requests NONE  Final   Gram Stain   Final    ABUNDANT WBC PRESENT, PREDOMINANTLY PMN NO ORGANISMS SEEN    Culture   Final    NO GROWTH 3 DAYS Performed at [facility], [address] [place], [place] [address]    Report Status PENDING  Incomplete  Resp Panel by RT-PCR (Flu A&B, Covid) Nasopharyngeal Swab     Status: None   Collection Time: 05/19/20  5:27 AM   Specimen: Nasopharyngeal Swab; Nasopharyngeal(NP) swabs in vial transport medium  Result Value Ref Range Status   SARS Coronavirus 2 by RT PCR NEGATIVE NEGATIVE Final    Comment: (NOTE) SARS-CoV-2 target nucleic acids are NOT DETECTED.  The SARS-CoV-2 RNA is generally detectable in upper respiratory specimens during the acute phase of infection. The lowest concentration of SARS-CoV-2 viral copies this assay can detect is 138 copies/mL. A negative result does not preclude SARS-Cov-2 infection and should not be used as the sole basis for treatment or other patient management decisions. A negative result may occur with  improper specimen collection/handling, submission of specimen other than nasopharyngeal swab, presence of viral mutation(s) within the areas targeted by this assay, and inadequate number of viral copies(<138 copies/mL). A negative result must be combined with clinical observations, patient history, and epidemiological information. The expected result is Negative.  Fact Sheet for Patients:  EntrepreneurPulse.com.au  Fact Sheet for Healthcare Providers:   IncredibleEmployment.be  This test is no t yet approved or cleared by the [place] FDA and  has been authorized for detection and/or diagnosis of SARS-CoV-2 by FDA under an Emergency Use Authorization (EUA). This EUA will remain  in effect (meaning this test can be used)  for the duration of the COVID-19 declaration under Section 564(b)(1) of the Act, 21 U.S.C.section 360bbb-3(b)(1), unless the authorization is terminated  or revoked sooner.       Influenza A by PCR NEGATIVE NEGATIVE Final   Influenza B by PCR NEGATIVE NEGATIVE Final    Comment: (NOTE) The Xpert Xpress SARS-CoV-2/FLU/RSV plus assay is intended as an aid in the diagnosis of influenza from Nasopharyngeal swab specimens and should not be used as a sole basis for treatment. Nasal washings and aspirates are unacceptable for Xpert Xpress SARS-CoV-2/FLU/RSV testing.  Fact Sheet for Patients: EntrepreneurPulse.com.au  Fact Sheet for Healthcare Providers: IncredibleEmployment.be  This test is not yet approved or cleared by the [place] FDA and has been authorized for detection and/or diagnosis of SARS-CoV-2 by FDA under an Emergency Use Authorization (EUA). This EUA will remain in effect (meaning this test can be used) for the duration of the COVID-19 declaration under Section 564(b)(1) of the Act, 21 U.S.C. section 360bbb-3(b)(1), unless the authorization is terminated or revoked.  Performed at [facility], [place] [address] [place], [place] [address]     RADIOLOGY STUDIES/RESULTS: No results found.   LOS: 3 days   Oren Binet, MD  [facility]    To contact the attending provider between 7A-7P or the covering provider during after hours 7P-7A, please log into the web site [email or web address] and access using universal [place] password for that web site. If you do not have the password, please call the hospital  operator.  05/21/2020, 11:44 AM

## 2020-05-21 NOTE — Evaluation (Signed)
Physical Therapy Evaluation Patient Details Name: [name] MRN: [id number] DOB: [birth year] Today's Date: 05/21/2020   History of Present Illness  [name] is a 70 y.o. female admitted 4/25 for evaluation of left knee pain and swelling and fatigue.  She reports that she is having swelling and pain of her left knee for the last 3 days is progressed to the point where she has difficulty walking. She states she does not have a history of gout.  She reports she has been more fatigued over the last week and has increasing shortness of breath with exertion.  Shortness of breath improves if she sits and rest.  She reports she has black dark stools as well as occasionally maroon blood in her stool.  A few weeks ago she was admitted to [facility] for GI bleed and had a colonoscopy although bowel prep was insufficient and patient was then be scheduled for repeat colonoscopy in the next few weeks she states.  Today she is found have a hemoglobin of 6.6.  Past medical history significant for HTN, DMT2, CKD 3, anemia with history of GI bleeding   Clinical Impression  Pt fully participated in evaluation. Pt I pta. Pt is now requiring use of AD and min guard due to balance deficits with an increase in pain in L knee. Pt with limited activity tolerance as well due to pain. Pt will benefit from skilled PT to address deficits in strength, balance, pain, gait, safety and endurance to maximize independence with functional mobility prior to discharge.     Follow Up Recommendations Home health PT;Supervision - Intermittent    Equipment Recommendations  None recommended by PT (pt has RW and SPC)    Recommendations for Other Services       Precautions / Restrictions Precautions Precautions: Fall Restrictions Weight Bearing Restrictions: No      Mobility  Bed Mobility Overal bed mobility: Modified Independent                  Transfers Overall transfer level: Needs  assistance Equipment used: Rolling walker (2 wheeled) Transfers: Sit to/from Stand Sit to Stand: Min guard            Ambulation/Gait Ambulation/Gait assistance: Min guard Gait Distance (Feet): 25 Feet Assistive device: Rolling walker (2 wheeled) Gait Pattern/deviations: Step-to pattern;Narrow base of support;Trunk flexed     General Gait Details: antalgic gait due to L knee pain. decreased velocity, increased WBing through [place]. Performed ambulation x 2 and performed toilet transfer and toileting with min guard.  Stairs            Wheelchair Mobility    Modified Rankin (Stroke Patients Only)       Balance Overall balance assessment: Needs assistance   Sitting balance-Leahy Scale: Good       Standing balance-Leahy Scale: Fair Standing balance comment: static standing with decreased weight on LLE with no UE support                             Pertinent Vitals/Pain Pain Assessment: 0-10 Pain Score: 5  Pain Location: L knee    Home Living Family/patient expects to be discharged to:: Private residence Living Arrangements: Alone Available Help at Discharge: Family;Available PRN/intermittently Type of Home: House Home Access: Stairs to enter Entrance Stairs-Rails: None Entrance Stairs-Number of Steps: 3 Home Layout: One level Home Equipment: Cane - single point;Walker - 2 wheels  Prior Function Level of Independence: Independent         Comments: occasionally uses SPC     Hand Dominance   Dominant Hand: Right    Extremity/Trunk Assessment        Lower Extremity Assessment Lower Extremity Assessment: LLE deficits/detail LLE Deficits / Details: increased pain with movemetn of L LE LLE: Unable to fully assess due to pain       Communication   Communication: No difficulties  Cognition Arousal/Alertness: Awake/alert Behavior During Therapy: WFL for tasks assessed/performed Overall Cognitive Status: Within Functional Limits  for tasks assessed                                        General Comments      Exercises     Assessment/Plan    PT Assessment Patient needs continued PT services  PT Problem List Decreased strength;Decreased mobility;Decreased coordination;Decreased activity tolerance;Decreased balance;Pain       PT Treatment Interventions DME instruction;Therapeutic exercise;Gait training;Balance training;Therapeutic activities;Patient/family education    PT Goals (Current goals can be found in the Care Plan section)  Acute Rehab PT Goals Patient Stated Goal: To go home PT Goal Formulation: With patient Time For Goal Achievement: 06/04/20 Potential to Achieve Goals: Good    Frequency Min 3X/week   Barriers to discharge        Co-evaluation               AM-PAC PT "6 Clicks" Mobility  Outcome Measure Help needed turning from your back to your side while in a flat bed without using bedrails?: None Help needed moving from lying on your back to sitting on the side of a flat bed without using bedrails?: None Help needed moving to and from a bed to a chair (including a wheelchair)?: A Little Help needed standing up from a chair using your arms (e.g., wheelchair or bedside chair)?: A Little Help needed to walk in hospital room?: A Little Help needed climbing 3-5 steps with a railing? : A Lot 6 Click Score: 19    End of Session Equipment Utilized During Treatment: Gait belt Activity Tolerance: Patient tolerated treatment well;Patient limited by pain Patient left: in chair;Other (comment) (NA and RN notified pt in chair at sink to clean up with call bell in hand) Nurse Communication: Mobility status PT Visit Diagnosis: Unsteadiness on feet (R26.81);Pain;Other abnormalities of gait and mobility (R26.89) Pain - Right/Left: Left Pain - part of body: Knee    Time: 1311-1336 PT Time Calculation (min) (ACUTE ONLY): 25 min   Charges:   PT Evaluation $PT Eval Low  Complexity: 1 Low PT Treatments $Therapeutic Activity: 8-22 mins        Lyanne Co, DPT Acute Rehabilitation Services [phone]  [place] 05/21/2020, 2:10 PM

## 2020-05-22 ENCOUNTER — Other Ambulatory Visit (HOSPITAL_COMMUNITY): Payer: Self-pay

## 2020-05-22 LAB — BPAM RBC
Blood Product Expiration Date: 202204292359
Blood Product Expiration Date: 202205132359
Blood Product Expiration Date: 202205182359
ISSUE DATE / TIME: 202204252344
ISSUE DATE / TIME: 202204261124
Unit Type and Rh: 6200
Unit Type and Rh: 6200
Unit Type and Rh: 9500

## 2020-05-22 LAB — TYPE AND SCREEN
ABO/RH(D): A POS
Antibody Screen: NEGATIVE
Unit division: 0
Unit division: 0
Unit division: 0

## 2020-05-22 LAB — CBC
HCT: 27.8 % — ABNORMAL LOW (ref 36.0–46.0)
Hemoglobin: 9.1 g/dL — ABNORMAL LOW (ref 12.0–15.0)
MCH: 30.3 pg (ref 26.0–34.0)
MCHC: 32.7 g/dL (ref 30.0–36.0)
MCV: 92.7 fL (ref 80.0–100.0)
Platelets: 225 10*3/uL (ref 150–400)
RBC: 3 MIL/uL — ABNORMAL LOW (ref 3.87–5.11)
RDW: 14.6 % (ref 11.5–15.5)
WBC: 7.7 10*3/uL (ref 4.0–10.5)
nRBC: 0.3 % — ABNORMAL HIGH (ref 0.0–0.2)

## 2020-05-22 LAB — BASIC METABOLIC PANEL
Anion gap: 11 (ref 5–15)
BUN: 113 mg/dL — ABNORMAL HIGH (ref 8–23)
CO2: 20 mmol/L — ABNORMAL LOW (ref 22–32)
Calcium: 8.2 mg/dL — ABNORMAL LOW (ref 8.9–10.3)
Chloride: 99 mmol/L (ref 98–111)
Creatinine, Ser: 4.5 mg/dL — ABNORMAL HIGH (ref 0.44–1.00)
GFR, Estimated: 10 mL/min — ABNORMAL LOW (ref >60)
Glucose, Bld: 343 mg/dL — ABNORMAL HIGH (ref 70–99)
Potassium: 5.2 mmol/L — ABNORMAL HIGH (ref 3.5–5.1)
Sodium: 130 mmol/L — ABNORMAL LOW (ref 135–145)

## 2020-05-22 LAB — GLUCOSE, CAPILLARY
Glucose-Capillary: 347 mg/dL — ABNORMAL HIGH (ref 70–99)
Glucose-Capillary: 354 mg/dL — ABNORMAL HIGH (ref 70–99)

## 2020-05-22 LAB — BODY FLUID CULTURE W GRAM STAIN: Culture: NO GROWTH

## 2020-05-22 MED ORDER — PREDNISONE 5 MG PO TABS
30.0000 mg | ORAL_TABLET | Freq: Every day | ORAL | Status: DC
  Administered 2020-05-22: 30 mg via ORAL
  Filled 2020-05-22: qty 1

## 2020-05-22 MED ORDER — ENSURE ENLIVE PO LIQD: 237.0000 mL | Freq: Two times a day (BID) | ORAL | 0 refills | Status: AC

## 2020-05-22 MED ORDER — SODIUM ZIRCONIUM CYCLOSILICATE 10 G PO PACK
10.0000 g | PACK | Freq: Three times a day (TID) | ORAL | Status: DC
  Administered 2020-05-22: 10 g via ORAL
  Filled 2020-05-22: qty 1

## 2020-05-22 MED ORDER — ALLOPURINOL 100 MG PO TABS
100.0000 mg | ORAL_TABLET | Freq: Every day | ORAL | 0 refills | Status: DC
  Filled 2020-05-22: qty 30, 30d supply, fill #0

## 2020-05-22 MED ORDER — SODIUM ZIRCONIUM CYCLOSILICATE 10 G PO PACK
10.0000 g | PACK | Freq: Two times a day (BID) | ORAL | 0 refills | Status: AC
  Filled 2020-05-22: qty 14, 7d supply, fill #0

## 2020-05-22 NOTE — Consult Note (Signed)
   [facility] Inpatient Consult   05/22/2020  [name] 1950/04/22 [id number]   [place] [facility] [[facility]] Patient: [name]  Primary Care Provider: Showing up as Provider not in system - encounters reveals [place]   Patient screened for hospitalization with noted high risk score for unplanned readmission risk and to assess for potential [employer] eligibility service needs for post hospital transition.  Spoke with patient via hospital phone and attempted call x 2. Patient answered and is currently working with therapy. Currently, showing for transition today as well.  1:05 pm Called patient back and she confirms her primary care provider is with [place] [facility]. Patient expressed no further needs noted.  Plan:  No further follow up needs as patient is no has a PCP any longer in [place].  For questions contact:   Natividad Brood, RN BSN [facility] Liaison  [phone] business mobile phone Toll free office [phone]  Fax number: [phone] [email or web address]

## 2020-05-22 NOTE — Discharge Summary (Signed)
PATIENT DETAILS Name: [name] Age: 70 y.o. Sex: female Date of Birth: Dec 06, 1950 MRN: [id number]. Admitting Physician: Carlton Adam, MD [id number], Provider Not In  Admit Date: 05/18/2020 Discharge date: 05/22/2020  Recommendations for Outpatient Follow-up:  1. Follow up with PCP in 1-2 weeks 2. Please obtain CMP/CBC in one week 3. Please ensure follow-up with nephrology in 1 week  Admitted From:  Home  Disposition: Home with home health services   Home Health: Yes  Equipment/Devices: None  Discharge Condition: Stable  CODE STATUS: FULL CODE  Diet recommendation:  Diet Order            Diet - low sodium heart healthy           Diet Carb Modified           Diet renal with fluid restriction Fluid restriction: 1200 mL Fluid; Room service appropriate? Yes; Fluid consistency: Thin  Diet effective now                  Brief Narrative: Patient is a 70 y.o. female with history of DM-2, CKD stage IV,, recently admitted at [facility] for GI bleeding-presented with left knee pain, fatigue-was found to have a hemoglobin of 6.6.  She was subsequently admitted to the hospitalist service-see below for further details.  Significant events: 4/25>> admit with fatigue-hemoglobin of 6.6. 4/25>> synovial fluid-WBC 6900, intracellular monosodium urate crystals  Significant studies: 4/25>> x-ray left knee: No acute osseous abnormality.  Antimicrobial therapy: None  Microbiology data: 4/25>> Blood culture: Negative 4/25>> synovial fluid culture: No growth  Procedures : 4/25>> left knee aspiration/arthrocentesis in the emergency room 4/27>> EGD: Normal stomach/esophagus/duodenum  Consults: GI   Brief Hospital Course: Probable upper GI bleeding: Seems to have resolved-no BM for almost 3 days.  Hemoglobin stable.  EGD without any bleeding foci.  Acute blood loss anemia superimposed on normocytic anemia related to chronic  kidney disease: Required 2 units of PRBC transfusion so far-hemoglobin currently stable.  Follow closely-nephrology in the outpatient setting needs to evaluate if patient qualifies for Aranesp injection.  AKI on CKD stage IV: Suspect AKI was due to osmotic diuresis in the setting of hyperglycemia due to steroid use.  Creatinine has stabilized-but still not yet at baseline-continue supportive care-avoid nephrotoxic agents.    Chart reviewed with nephrology on-call Dr. Riley Kill are to continue supportive care-anticipate that her kidney function will stabilize and improve with further better control of her hyperglycemia.  Patient instructed to follow with Dr. Marisue Humble in 1 week to recheck electrolytes.  Hyperkalemia:  Continues to have mild intermittent hyperkalemia-we will discharge on a few days of Lokelma.  HTN: BP relatively stable-continue minoxidil, clonidine, amlodipine-follow and optimize.  DM-2 (A1c 6.1 on 3/31) with uncontrolled hyperglycemia due to steroid use: Had severe hyperglycemia requiring initiation of IV insulin-no longer on IV insulin-steroid dosage has been stopped on 4/29.  Anticipate further improvement in her blood sugars over the next few days.  Do not plan to initiate insulin in the outpatient setting-patient to resume her usual outpatient regimen.   HLD: Continue statin  Left knee swelling/pain-likely acute gout: Significant improvement for 2 days-no tenderness-we will finish a short course of prednisone on 4/29.  Has already been started on allopurinol which will be continued on discharge.     Discharge Diagnoses:  Principal Problem:   GI bleed Active Problems:   Essential hypertension   Type 2 diabetes mellitus with hyperglycemia (HCC)   CKD (chronic kidney disease) stage  3, GFR 30-59 ml/min (HCC)   Knee pain   Heme positive stool   Discharge Instructions:  Activity:  As tolerated   Discharge Instructions    Call MD for:  difficulty  breathing, headache or visual disturbances   Complete by: As directed    Diet - low sodium heart healthy   Complete by: As directed    Diet Carb Modified   Complete by: As directed    Discharge instructions   Complete by: As directed    Follow with Primary MD in 1-2 weeks  Please follow-up with a primary nephrologist-Dr. Joelyn Oms within 1 week-you will need repeat electrolytes.  Please get a complete blood count and chemistry panel checked by your Primary MD at your next visit, and again as instructed by your Primary MD.  Get Medicines reviewed and adjusted: Please take all your medications with you for your next visit with your Primary MD  Laboratory/radiological data: Please request your Primary MD to go over all hospital tests and procedure/radiological results at the follow up, please ask your Primary MD to get all Hospital records sent to his/her office.  In some cases, they will be blood work, cultures and biopsy results pending at the time of your discharge. Please request that your primary care M.D. follows up on these results.  Also Note the following: If you experience worsening of your admission symptoms, develop shortness of breath, life threatening emergency, suicidal or homicidal thoughts you must seek medical attention immediately by calling 911 or calling your MD immediately  if symptoms less severe.  You must read complete instructions/literature along with all the possible adverse reactions/side effects for all the Medicines you take and that have been prescribed to you. Take any new Medicines after you have completely understood and accpet all the possible adverse reactions/side effects.   Do not drive when taking Pain medications or sleeping medications (Benzodaizepines)  Do not take more than prescribed Pain, Sleep and Anxiety Medications. It is not advisable to combine anxiety,sleep and pain medications without talking with your primary care practitioner  Special  Instructions: If you have smoked or chewed Tobacco  in the last 2 yrs please stop smoking, stop any regular Alcohol  and or any Recreational drug use.  Wear Seat belts while driving.  Please note: You were cared for by a hospitalist during your hospital stay. Once you are discharged, your primary care physician will handle any further medical issues. Please note that NO REFILLS for any discharge medications will be authorized once you are discharged, as it is imperative that you return to your primary care physician (or establish a relationship with a primary care physician if you do not have one) for your post hospital discharge needs so that they can reassess your need for medications and monitor your lab values.   Increase activity slowly   Complete by: As directed      Allergies as of 05/22/2020      Reactions   Semaglutide Rash   Rybelsus 3 mg      Medication List    STOP taking these medications   chlorthalidone 25 MG tablet Commonly known as: HYGROTON     TAKE these medications   acarbose 25 MG tablet Commonly known as: PRECOSE Take 0.5 tablets (12.5 mg total) by mouth 3 (three) times daily with meals.   allopurinol 100 MG tablet Commonly known as: ZYLOPRIM Take 1 tablet (100 mg total) by mouth daily. Start taking on: May 23, 2020   amLODipine  10 MG tablet Commonly known as: NORVASC TAKE 1 TABLET BY MOUTH EVERY DAY   atorvastatin 80 MG tablet Commonly known as: LIPITOR TAKE 1 TABLET BY MOUTH EVERY DAY   bromocriptine 2.5 MG tablet Commonly known as: PARLODEL Take 0.5 tablets (1.25 mg total) by mouth daily. What changed: how much to take   cloNIDine 0.2 MG tablet Commonly known as: CATAPRES Take 1 tablet (0.2 mg total) by mouth 3 (three) times daily.   epoetin alfa-epbx 40000 UNIT/ML injection Commonly known as: RETACRIT Inject 40,000 Units into the skin every 28 (twenty-eight) days.   feeding supplement Liqd Take 237 mLs by mouth 2 (two) times daily  between meals.   furosemide 40 MG tablet Commonly known as: LASIX Take 40 mg by mouth daily as needed for fluid or edema.   metoprolol succinate 100 MG 24 hr tablet Commonly known as: TOPROL-XL Take 100 mg by mouth daily. Take with or immediately following a meal.   minoxidil 10 MG tablet Commonly known as: LONITEN Take 10 mg by mouth 2 (two) times daily.   minoxidil 2.5 MG tablet Commonly known as: LONITEN Take 5 mg by mouth 2 (two) times daily.   OneTouch Delica Lancets 15V Misc 1 each by Does not apply route 2 (two) times daily. E11.9   OneTouch Verio Flex System w/Device Kit 1 each by Does not apply route 2 (two) times daily. E11.9   OneTouch Verio test strip Generic drug: glucose blood USE AS DIRECTED TWICE DAILY   pantoprazole 40 MG tablet Commonly known as: PROTONIX Take 1 tablet (40 mg total) by mouth daily.   polyethylene glycol 17 g packet Commonly known as: MIRALAX / GLYCOLAX Take 34-51 g by mouth every other day.   repaglinide 2 MG tablet Commonly known as: PRANDIN Take 1 tablet (2 mg total) by mouth 2 (two) times daily before a meal.   sodium zirconium cyclosilicate 10 g Pack packet Commonly known as: LOKELMA Take 10 g by mouth 2 (two) times daily for 7 days.       Follow-up Information    Primary care MD. Schedule an appointment as soon as possible for a visit in 1 week(s).        Rexene Agent, MD. Schedule an appointment as soon as possible for a visit in 1 week(s).   Specialty: Nephrology Why: For post hospital discharge visit and repeat renal function check. Contact information: [place] [address] [phone]              Allergies  Allergen Reactions  . Semaglutide Rash    Rybelsus 3 mg      Other Procedures/Studies: CT Abdomen Pelvis Wo Contrast  Result Date: 05/08/2020 CLINICAL DATA:  Melena, acute generalized abdominal pain. EXAM: CT ABDOMEN AND PELVIS WITHOUT CONTRAST TECHNIQUE: Multidetector CT  imaging of the abdomen and pelvis was performed following the standard protocol without IV contrast. COMPARISON:  November 29, 2019. FINDINGS: Lower chest: Minimal bibasilar subsegmental atelectasis is noted. Hepatobiliary: No focal liver abnormality is seen. No gallstones, gallbladder wall thickening, or biliary dilatation. Pancreas: Unremarkable. No pancreatic ductal dilatation or surrounding inflammatory changes. Spleen: Normal in size without focal abnormality. Adrenals/Urinary Tract: Adrenal glands appear normal. Right renal subcapsular hematoma is significantly smaller compared to prior exam. No hydronephrosis or renal obstruction is noted. No renal or ureteral calculi are noted. Urinary bladder is unremarkable. Stomach/Bowel: Stomach is within normal limits. Appendix appears normal. No evidence of bowel wall thickening, distention, or inflammatory changes. Stool is  noted throughout the colon. Vascular/Lymphatic: Aortic atherosclerosis. No enlarged abdominal or pelvic lymph nodes. Reproductive: Status post hysterectomy. No adnexal masses. Other: No abdominal wall hernia or abnormality. No abdominopelvic ascites. Musculoskeletal: No acute or significant osseous findings. IMPRESSION: Right renal subcapsular hematoma noted on prior exam is significantly smaller currently. No other significant abnormality is noted in the abdomen or pelvis. Aortic Atherosclerosis (ICD10-I70.0). Electronically Signed   By: Marijo Conception M.D.   On: 05/08/2020 14:02   DG Knee Complete 4 Views Left  Result Date: 05/18/2020 CLINICAL DATA:  Knee pain, no known trauma EXAM: LEFT KNEE - COMPLETE 4+ VIEW COMPARISON:  December 01, 2019 FINDINGS: No evidence of fracture or dislocation. Small joint effusion. 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 Superior patellar pole again visualized. Vascular calcifications. IMPRESSION: No acute osseous abnormality visualized. 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 Small joint effusion. Electronically Signed   By: Dahlia Bailiff MD   On: 05/18/2020 17:04     TODAY-DAY OF DISCHARGE:  Subjective:   [name] today has no headache,no chest abdominal pain,no new weakness tingling or numbness, feels much better wants to go home today.   Objective:   Blood pressure (!) 159/53, pulse 68, temperature 98 F (36.7 C), temperature source Oral, resp. rate 18, height $RemoveBe'5\' 2"'pltLPbglU$  (1.575 m), weight 63 kg, SpO2 98 %.  Intake/Output Summary (Last 24 hours) at 05/22/2020 1021 Last data filed at 05/22/2020 0800 Gross per 24 hour  Intake 2366.58 ml  Output 550 ml  Net 1816.58 ml   Filed Weights   05/19/20 0732 05/19/20 0915  Weight: 63 kg 63 kg    Exam: Awake Alert, Oriented *3, No new F.N deficits, Normal affect [place].AT,PERRAL Supple Neck,No JVD, No cervical lymphadenopathy appriciated.  Symmetrical Chest wall movement, Good air movement bilaterally, CTAB RRR,No Gallops,Rubs or new Murmurs, No Parasternal Heave +ve B.Sounds, Abd Soft, Non tender, No organomegaly appriciated, No rebound -guarding or rigidity. No Cyanosis, Clubbing or edema, No new Rash or bruise   PERTINENT RADIOLOGIC STUDIES: No results found.   PERTINENT LAB RESULTS: CBC: Recent Labs    05/21/20 0056 05/22/20 0326  WBC 8.7 7.7  HGB 9.0* 9.1*  HCT 28.0* 27.8*  PLT 218 225   CMET CMP     Component Value Date/Time   NA 130 (L) 05/22/2020 0326   K 5.2 (H) 05/22/2020 0326   CL 99 05/22/2020 0326   CO2 20 (L) 05/22/2020 0326   GLUCOSE 343 (H) 05/22/2020 0326   BUN 113 (H) 05/22/2020 0326   CREATININE 4.50 (H) 05/22/2020 0326   CREATININE 2.09 (H) 09/24/2019 1044   CALCIUM 8.2 (L) 05/22/2020 0326   CALCIUM 9.0 02/28/2020 0944   PROT 6.2 (L) 05/20/2020 0720   ALBUMIN 2.7 (L) 05/21/2020 0056   AST 16 05/20/2020 0720   ALT 24 05/20/2020 0720   ALKPHOS 74 05/20/2020 0720   BILITOT 1.2 05/20/2020 0720   GFRNONAA 10 (L) 05/22/2020 0326    GFRAA 25 (L) 09/09/2017 0634    GFR Estimated Creatinine Clearance: 10.2 mL/min (A) (by C-G formula based on SCr of 4.5 mg/dL (H)). No results for input(s): LIPASE, AMYLASE in the last 72 hours. No results for input(s): CKTOTAL, CKMB, CKMBINDEX, TROPONINI in the last 72 hours. Invalid input(s): POCBNP No results for input(s): DDIMER in the last 72 hours. No results for input(s): HGBA1C in the last 72 hours. No results for input(s): CHOL, HDL, LDLCALC, TRIG, CHOLHDL, LDLDIRECT in the last  72 hours. No results for input(s): TSH, T4TOTAL, T3FREE, THYROIDAB in the last 72 hours.  Invalid input(s): FREET3 No results for input(s): VITAMINB12, FOLATE, FERRITIN, TIBC, IRON, RETICCTPCT in the last 72 hours. Coags: No results for input(s): INR in the last 72 hours.  Invalid input(s): PT Microbiology: Recent Results (from the past 240 hour(s))  Blood culture (routine x 2)     Status: None (Preliminary result)   Collection Time: 05/18/20  6:45 PM   Specimen: BLOOD  Result Value Ref Range Status   Specimen Description BLOOD RIGHT ARM  Final   Special Requests   Final    BOTTLES DRAWN AEROBIC AND ANAEROBIC Blood Culture results may not be optimal due to an inadequate volume of blood received in culture bottles   Culture   Final    NO GROWTH 4 DAYS Performed at [facility], [place] [address] [place], [place] [address]    Report Status PENDING  Incomplete  Body fluid culture w Gram Stain     Status: None   Collection Time: 05/18/20  9:02 PM   Specimen: Body Fluid  Result Value Ref Range Status   Specimen Description FLUID SYNOVIAL  Final   Special Requests NONE  Final   Gram Stain   Final    ABUNDANT WBC PRESENT, PREDOMINANTLY PMN NO ORGANISMS SEEN    Culture   Final    NO GROWTH 3 DAYS Performed at [facility], [address], [place], [place] [address]    Report Status 05/22/2020 FINAL  Final  Resp Panel by RT-PCR (Flu A&B, Covid) Nasopharyngeal Swab     Status: None    Collection Time: 05/19/20  5:27 AM   Specimen: Nasopharyngeal Swab; Nasopharyngeal(NP) swabs in vial transport medium  Result Value Ref Range Status   SARS Coronavirus 2 by RT PCR NEGATIVE NEGATIVE Final    Comment: (NOTE) SARS-CoV-2 target nucleic acids are NOT DETECTED.  The SARS-CoV-2 RNA is generally detectable in upper respiratory specimens during the acute phase of infection. The lowest concentration of SARS-CoV-2 viral copies this assay can detect is 138 copies/mL. A negative result does not preclude SARS-Cov-2 infection and should not be used as the sole basis for treatment or other patient management decisions. A negative result may occur with  improper specimen collection/handling, submission of specimen other than nasopharyngeal swab, presence of viral mutation(s) within the areas targeted by this assay, and inadequate number of viral copies(<138 copies/mL). A negative result must be combined with clinical observations, patient history, and epidemiological information. The expected result is Negative.  Fact Sheet for Patients:  EntrepreneurPulse.com.au  Fact Sheet for Healthcare Providers:  IncredibleEmployment.be  This test is no t yet approved or cleared by the [place] FDA and  has been authorized for detection and/or diagnosis of SARS-CoV-2 by FDA under an Emergency Use Authorization (EUA). This EUA will remain  in effect (meaning this test can be used) for the duration of the COVID-19 declaration under Section 564(b)(1) of the Act, 21 U.S.C.section 360bbb-3(b)(1), unless the authorization is terminated  or revoked sooner.       Influenza A by PCR NEGATIVE NEGATIVE Final   Influenza B by PCR NEGATIVE NEGATIVE Final    Comment: (NOTE) The Xpert Xpress SARS-CoV-2/FLU/RSV plus assay is intended as an aid in the diagnosis of influenza from Nasopharyngeal swab specimens and should not be used as a sole basis for treatment.  Nasal washings and aspirates are unacceptable for Xpert Xpress SARS-CoV-2/FLU/RSV testing.  Fact Sheet  for Patients: EntrepreneurPulse.com.au  Fact Sheet for Healthcare Providers: IncredibleEmployment.be  This test is not yet approved or cleared by the [place] FDA and has been authorized for detection and/or diagnosis of SARS-CoV-2 by FDA under an Emergency Use Authorization (EUA). This EUA will remain in effect (meaning this test can be used) for the duration of the COVID-19 declaration under Section 564(b)(1) of the Act, 21 U.S.C. section 360bbb-3(b)(1), unless the authorization is terminated or revoked.  Performed at [facility], [place] [address] [place], [place] [address]     FURTHER DISCHARGE INSTRUCTIONS:  Get Medicines reviewed and adjusted: Please take all your medications with you for your next visit with your Primary MD  Laboratory/radiological data: Please request your Primary MD to go over all hospital tests and procedure/radiological results at the follow up, please ask your Primary MD to get all Hospital records sent to his/her office.  In some cases, they will be blood work, cultures and biopsy results pending at the time of your discharge. Please request that your primary care M.D. goes through all the records of your hospital data and follows up on these results.  Also Note the following: If you experience worsening of your admission symptoms, develop shortness of breath, life threatening emergency, suicidal or homicidal thoughts you must seek medical attention immediately by calling 911 or calling your MD immediately  if symptoms less severe.  You must read complete instructions/literature along with all the possible adverse reactions/side effects for all the Medicines you take and that have been prescribed to you. Take any new Medicines after you have completely understood and accpet all the possible adverse  reactions/side effects.   Do not drive when taking Pain medications or sleeping medications (Benzodaizepines)  Do not take more than prescribed Pain, Sleep and Anxiety Medications. It is not advisable to combine anxiety,sleep and pain medications without talking with your primary care practitioner  Special Instructions: If you have smoked or chewed Tobacco  in the last 2 yrs please stop smoking, stop any regular Alcohol  and or any Recreational drug use.  Wear Seat belts while driving.  Please note: You were cared for by a hospitalist during your hospital stay. Once you are discharged, your primary care physician will handle any further medical issues. Please note that NO REFILLS for any discharge medications will be authorized once you are discharged, as it is imperative that you return to your primary care physician (or establish a relationship with a primary care physician if you do not have one) for your post hospital discharge needs so that they can reassess your need for medications and monitor your lab values.  Total Time spent coordinating discharge including counseling, education and face to face time equals 35 minutes.  SignedOren Binet 05/22/2020 10:21 AM

## 2020-05-22 NOTE — Evaluation (Signed)
Occupational Therapy Evaluation Patient Details Name: [name] MRN: [id number] DOB: 1950-10-31 Today's Date: 05/22/2020    History of Present Illness [name] is a 70 y.o. female admitted 4/25 for evaluation of left knee pain and swelling and fatigue.  She reports that she is having swelling and pain of her left knee for the last 3 days is progressed to the point where she has difficulty walking. She states she does not have a history of gout.  She reports she has been more fatigued over the last week and has increasing shortness of breath with exertion.  Shortness of breath improves if she sits and rest.  She reports she has black dark stools as well as occasionally maroon blood in her stool.  A few weeks ago she was admitted to [facility] for GI bleed and had a colonoscopy although bowel prep was insufficient and patient was then be scheduled for repeat colonoscopy in the next few weeks she states.  Today she is found have a hemoglobin of 6.6.  He has been afebrile.Past medical history significant for HTN, DMT2, CKD 3, anemia with history of GI bleeding who presents .   Clinical Impression   Pt min guard A - sup with LB selfcare, toileting ADLs in standing; min guard A - sup with mobility using RW. Pt very pleasant and cooperative; eager to return home. All education completed and no further acute IT services are indicated at this time    Follow Up Recommendations  Home health OT;Supervision - Intermittent    Equipment Recommendations  None recommended by OT    Recommendations for Other Services       Precautions / Restrictions Precautions Precautions: Fall Restrictions Weight Bearing Restrictions: No      Mobility Bed Mobility Overal bed mobility: Modified Independent                  Transfers Overall transfer level: Needs assistance Equipment used: Rolling walker (2 wheeled) Transfers: Sit to/from Stand Sit to Stand: Min guard;Supervision               Balance Overall balance assessment: Needs assistance Sitting-balance support: No upper extremity supported;Feet supported Sitting balance-Leahy Scale: Good     Standing balance support: Bilateral upper extremity supported;No upper extremity supported;During functional activity Standing balance-Leahy Scale: Fair                             ADL either performed or assessed with clinical judgement   ADL Overall ADL's : Needs assistance/impaired Eating/Feeding: Independent   Grooming: Wash/dry hands;Wash/dry face;Min guard;Set up;Supervision/safety;Standing   Upper Body Bathing: Set up;Independent   Lower Body Bathing: Min guard;Supervison/ safety   Upper Body Dressing : Set up;Independent   Lower Body Dressing: Min guard;Supervision/safety   Toilet Transfer: Min guard;Supervision/safety;Ambulation;RW;Cueing for safety;Comfort height toilet;Grab bars   Toileting- Clothing Manipulation and Hygiene: Supervision/safety;Sit to/from stand       Functional mobility during ADLs: Min guard;Supervision/safety;Cueing for safety;Rolling walker General ADL Comments: Pt educated on ADL and AD mobility safety, pt able to verbalize and demo safely     Vision Baseline Vision/History: Wears glasses Wears Glasses: Reading only Patient Visual Report: No change from baseline       Perception     Praxis      Pertinent Vitals/Pain Pain Assessment: No/denies pain Pain Score: 0-No pain Pain Intervention(s): Monitored during session     Hand Dominance Right   Extremity/Trunk  Assessment Upper Extremity Assessment Upper Extremity Assessment: Generalized weakness   Lower Extremity Assessment Lower Extremity Assessment: Defer to PT evaluation   Cervical / Trunk Assessment Cervical / Trunk Assessment: Normal   Communication Communication Communication: No difficulties   Cognition Arousal/Alertness: Awake/alert Behavior During Therapy: WFL for tasks  assessed/performed Overall Cognitive Status: Within Functional Limits for tasks assessed                                     General Comments       Exercises     Shoulder Instructions      Home Living Family/patient expects to be discharged to:: Private residence Living Arrangements: Alone Available Help at Discharge: Family;Available PRN/intermittently Type of Home: House Home Access: Stairs to enter [employer] of Steps: 3 Entrance Stairs-Rails: None Home Layout: One level     Bathroom Shower/Tub: Teacher, early years/pre: Standard Bathroom Accessibility: Yes   Home Equipment: Cane - single point;Walker - 2 wheels;Grab bars - tub/shower          Prior Functioning/Environment Level of Independence: Independent        Comments: occasionally uses SPC        OT Problem List: Decreased activity tolerance      OT Treatment/Interventions: Self-care/ADL training;Patient/family education;Therapeutic activities    OT Goals(Current goals can be found in the care plan section) Acute Rehab OT Goals Patient Stated Goal: To go home OT Goal Formulation: With patient  OT Frequency:     Barriers to D/C:            Co-evaluation              AM-PAC OT "6 Clicks" Daily Activity     Outcome Measure Help from another person eating meals?: None Help from another person taking care of personal grooming?: A Little Help from another person toileting, which includes using toliet, bedpan, or urinal?: A Little Help from another person bathing (including washing, rinsing, drying)?: A Little Help from another person to put on and taking off regular upper body clothing?: None Help from another person to put on and taking off regular lower body clothing?: A Little 6 Click Score: 20   End of Session Equipment Utilized During Treatment: Rolling walker  Activity Tolerance: Patient tolerated treatment well Patient left: in bed;with call  bell/phone within reach;with bed alarm set  OT Visit Diagnosis: Unsteadiness on feet (R26.81);Other abnormalities of gait and mobility (R26.89);Muscle weakness (generalized) (M62.81)                Time: 9528-4132 OT Time Calculation (min): 25 min Charges:  OT General Charges $OT Visit: 1 Visit OT Evaluation $OT Eval Low Complexity: 1 Low OT Treatments $Self Care/Home Management : 8-22 mins    Britt Bottom 05/22/2020, 12:11 PM

## 2020-05-22 NOTE — TOC Transition Note (Addendum)
Transition of Care [facility]) - CM/SW Discharge Note   Patient Details  Name: [name] MRN: [id number] Date of Birth: Oct 01, 1950  Transition of Care [facility]) CM/SW Contact:  Verdell Carmine, RN Phone Number: 05/22/2020, 4:31 PM   Clinical Narrative:     Home health ordered [facility] unable to reach patient via phone. Awaiting confirmation for [place] accepted by [facility]  Final next level of care: Home w Home Health Services Barriers to Discharge: No Barriers Identified   Patient Goals and CMS Choice Patient states their goals for this hospitalization and ongoing recovery are:: to go home      Discharge Placement                       Discharge Plan and Services   Discharge Planning Services: CM Consult              DME Agency: NA       HH Arranged: OT,PT [place]: [place] ([facility]) Date HH Agency Contacted: 05/22/20 Time [place]: 1631 Representative spoke with at [place]: Ramond Marrow  Social Determinants of Health ([place]) Interventions     Readmission Risk Interventions Readmission Risk Prevention Plan 05/20/2020  Transportation Screening Complete  HRI or Home Care Consult Complete  Social Work Consult for [place] Planning/Counseling Complete  Palliative Care Screening Not Applicable  Some recent data might be hidden

## 2020-05-22 NOTE — Care Management Important Message (Signed)
Important Message  Patient Details  Name: [name] MRN: [id number] Date of Birth: 23-Apr-1950   Medicare Important Message Given:  Yes - Important Message mailed due to current National Emergency  Verbal consent obtained due to current National Emergency  Relationship to patient: Self Contact Name: [name] Date: 05/22/20  Time: 3391 Phone: [phone] Outcome: No Answer/Busy Important Message mailed to: Patient address on file    Delorse Lek 05/22/2020, 1:41 PM

## 2020-05-22 NOTE — Plan of Care (Signed)
  Problem: Clinical Measurements: Goal: Respiratory complications will improve Outcome: Progressing Goal: Cardiovascular complication will be avoided Outcome: Progressing   Problem: Nutrition: Goal: Adequate nutrition will be maintained Outcome: Progressing   Problem: Coping: Goal: Level of anxiety will decrease Outcome: Progressing   Problem: Elimination: Goal: Will not experience complications related to bowel motility Outcome: Progressing   Problem: Pain Managment: Goal: General experience of comfort will improve Outcome: Progressing

## 2020-05-23 LAB — CULTURE, BLOOD (ROUTINE X 2): Culture: NO GROWTH

## 2020-05-25 DIAGNOSIS — G8929 Other chronic pain: Secondary | ICD-10-CM | POA: Diagnosis not present

## 2020-05-25 DIAGNOSIS — E1122 Type 2 diabetes mellitus with diabetic chronic kidney disease: Secondary | ICD-10-CM | POA: Diagnosis not present

## 2020-05-25 DIAGNOSIS — E875 Hyperkalemia: Secondary | ICD-10-CM | POA: Diagnosis not present

## 2020-05-25 DIAGNOSIS — I129 Hypertensive chronic kidney disease with stage 1 through stage 4 chronic kidney disease, or unspecified chronic kidney disease: Secondary | ICD-10-CM | POA: Diagnosis not present

## 2020-05-25 DIAGNOSIS — M109 Gout, unspecified: Secondary | ICD-10-CM | POA: Diagnosis not present

## 2020-05-25 DIAGNOSIS — N179 Acute kidney failure, unspecified: Secondary | ICD-10-CM | POA: Diagnosis not present

## 2020-05-25 DIAGNOSIS — M5416 Radiculopathy, lumbar region: Secondary | ICD-10-CM | POA: Diagnosis not present

## 2020-05-25 DIAGNOSIS — K5900 Constipation, unspecified: Secondary | ICD-10-CM | POA: Diagnosis not present

## 2020-05-25 DIAGNOSIS — D631 Anemia in chronic kidney disease: Secondary | ICD-10-CM | POA: Diagnosis not present

## 2020-05-25 DIAGNOSIS — N184 Chronic kidney disease, stage 4 (severe): Secondary | ICD-10-CM | POA: Diagnosis not present

## 2020-05-25 DIAGNOSIS — M1712 Unilateral primary osteoarthritis, left knee: Secondary | ICD-10-CM | POA: Diagnosis not present

## 2020-05-25 DIAGNOSIS — E785 Hyperlipidemia, unspecified: Secondary | ICD-10-CM | POA: Diagnosis not present

## 2020-05-25 DIAGNOSIS — E1165 Type 2 diabetes mellitus with hyperglycemia: Secondary | ICD-10-CM | POA: Diagnosis not present

## 2020-05-27 ENCOUNTER — Telehealth: Payer: Self-pay | Admitting: Pharmacist

## 2020-05-27 NOTE — Progress Notes (Signed)
Chronic Care Management Pharmacy Assistant   Name: [name]  MRN: [id number] DOB: [birth year]   Reason for Encounter:Diabetic Disease State Call   Conditions to be addressed/monitored: DMII   Recent office visits:  None ID  Recent consult visits:  04/23/20 Dr. Ronni [name] visits:  Medication Reconciliation was completed by comparing discharge summary, patient's EMR and Pharmacy list, and upon discussion with patient.  Admitted to the hospital on 05/08/20 due to gastrointestinal hemorrhage. Discharge date was 05/08/20. Discharged from [facility]. 05/19/20 gastrointestinal hemorrhage 05/22/20 at [facility]?Medications Started at [facility] Discharge:?? -started allopurinol 100 mg  Medication Changes at Hospital Discharge: -Changed none  Medications Discontinued at Hospital Discharge: -Stopped chlorthalidone 25 mg  Medications that remain the same after Hospital Discharge:??  -All other medications will remain the same.    Medications: Outpatient Encounter Medications as of 05/27/2020  Medication Sig  . acarbose (PRECOSE) 25 MG tablet Take 0.5 tablets (12.5 mg total) by mouth 3 (three) times daily with meals.  [name] allopurinol (ZYLOPRIM) 100 MG tablet Take 1 tablet (100 mg total) by mouth daily.  [name] amLODipine (NORVASC) 10 MG tablet TAKE 1 TABLET BY MOUTH EVERY DAY  . atorvastatin (LIPITOR) 80 MG tablet TAKE 1 TABLET BY MOUTH EVERY DAY  . Blood Glucose Monitoring Suppl (ONETOUCH VERIO FLEX SYSTEM) w/Device KIT 1 each by Does not apply route 2 (two) times daily. E11.9  . bromocriptine (PARLODEL) 2.5 MG tablet Take 0.5 tablets (1.25 mg total) by mouth daily. (Patient taking differently: Take 2.5 mg by mouth daily.)  . cloNIDine (CATAPRES) 0.2 MG tablet Take 1 tablet (0.2 mg total) by mouth 3 (three) times daily.  [name] epoetin alfa-epbx (RETACRIT) 41638 UNIT/ML injection Inject 40,000 Units into the skin every 28 (twenty-eight) days.  .  feeding supplement (ENSURE ENLIVE / ENSURE PLUS) LIQD Take 237 mLs by mouth 2 (two) times daily between meals.  . furosemide (LASIX) 40 MG tablet Take 40 mg by mouth daily as needed for fluid or edema.  [name] glucose blood (ONETOUCH VERIO) test strip USE AS DIRECTED TWICE DAILY  . metoprolol succinate (TOPROL-XL) 100 MG 24 hr tablet Take 100 mg by mouth daily. Take with or immediately following a meal.  . minoxidil (LONITEN) 10 MG tablet Take 10 mg by mouth 2 (two) times daily.  [name] Delica Lancets 45X MISC 1 each by Does not apply route 2 (two) times daily. E11.9  . pantoprazole (PROTONIX) 40 MG tablet Take 1 tablet (40 mg total) by mouth daily.  . polyethylene glycol (MIRALAX / GLYCOLAX) 17 g packet Take 34-51 g by mouth every other day.  . repaglinide (PRANDIN) 2 MG tablet Take 1 tablet (2 mg total) by mouth 2 (two) times daily before a meal.  . sodium zirconium cyclosilicate (LOKELMA) 10 g PACK packet Take 1 packet (10 g) by mouth 2 (two) times daily for 7 days.   No facility-administered encounter medications on file as of 05/27/2020.    Recent Relevant Labs: Lab Results  Component Value Date/Time   HGBA1C 6.1 (A) 04/23/2020 10:02 AM   HGBA1C 6.1 (A) 02/24/2020 11:03 AM   HGBA1C 8.0 (H) 11/29/2019 04:25 AM   HGBA1C 8.3 (H) 11/28/2019 05:00 AM   MICROALBUR 12.8 (H) 08/28/2015 11:49 AM   MICROALBUR 140.0 Verified by manual dilution. mg/dL (H) 04/09/2008 09:37 AM    Kidney Function Lab Results  Component Value Date/Time   CREATININE 4.50 (H) 05/22/2020 03:26 AM  CREATININE 4.34 (H) 05/21/2020 07:15 AM   CREATININE 2.09 (H) 09/24/2019 10:44 AM   GFR 25.74 (L) 07/11/2018 12:58 PM   GFRNONAA 10 (L) 05/22/2020 03:26 AM   GFRAA 25 (L) 09/09/2017 06:34 AM    . Current antihyperglycemic regimen:  Acarbose 25 mg 1/2 tab 3 times daily, repaglinide 2 mg 2 times daily, bromocriptine 2.5 mg daily  . What recent interventions/DTPs have been made to improve glycemic control:  Patient is  to check blood sugars once a day, also check if any symptoms of blood sugar to high or low and keep record, per Dr. Loanne [name]  . Have there been any recent hospitalizations or ED visits since last visit with CPP? Patient has been seen at [facility] on 05/08/20 for gastrointestinal hemorrhage and also at [facility] on 05/18/20  . Patient denies any low readings and hypoglycemic symptoms  . Patient denies any high readings and hyperglycemic symptoms  . How often are you checking your blood sugar? Patient states that she checks blood sugar once a day  . What are your blood sugars ranging?Patient not sure but this morning blood sugar was 147 o Fasting:  o Before meals:  o After meals:  o Bedtime:  . During the week, how often does your blood glucose drop below 70? Patient has not had any readings below 70  . Are you checking your feet daily/regularly? Patient states that she does not have any problems with feet swelling, or sores  Adherence Review: Is the patient currently on a STATIN medication? Atorvastatin Is the patient currently on ACE/ARB medication? No Does the patient have >5 day gap between last estimated fill dates? No   Star Rating Drugs: Atorvastatin 03/30/20 90 ds  [place] Pharmacist Assistant [phone]  Time spent:38

## 2020-05-28 DIAGNOSIS — M1712 Unilateral primary osteoarthritis, left knee: Secondary | ICD-10-CM | POA: Diagnosis not present

## 2020-05-28 DIAGNOSIS — N1832 Chronic kidney disease, stage 3b: Secondary | ICD-10-CM | POA: Diagnosis not present

## 2020-05-28 DIAGNOSIS — M109 Gout, unspecified: Secondary | ICD-10-CM | POA: Diagnosis not present

## 2020-05-28 DIAGNOSIS — E785 Hyperlipidemia, unspecified: Secondary | ICD-10-CM | POA: Diagnosis not present

## 2020-05-28 DIAGNOSIS — K5900 Constipation, unspecified: Secondary | ICD-10-CM | POA: Diagnosis not present

## 2020-05-28 DIAGNOSIS — E1165 Type 2 diabetes mellitus with hyperglycemia: Secondary | ICD-10-CM | POA: Diagnosis not present

## 2020-05-28 DIAGNOSIS — E782 Mixed hyperlipidemia: Secondary | ICD-10-CM | POA: Diagnosis not present

## 2020-05-28 DIAGNOSIS — N179 Acute kidney failure, unspecified: Secondary | ICD-10-CM | POA: Diagnosis not present

## 2020-05-28 DIAGNOSIS — E1122 Type 2 diabetes mellitus with diabetic chronic kidney disease: Secondary | ICD-10-CM | POA: Diagnosis not present

## 2020-05-28 DIAGNOSIS — D631 Anemia in chronic kidney disease: Secondary | ICD-10-CM | POA: Diagnosis not present

## 2020-05-28 DIAGNOSIS — G8929 Other chronic pain: Secondary | ICD-10-CM | POA: Diagnosis not present

## 2020-05-28 DIAGNOSIS — M5416 Radiculopathy, lumbar region: Secondary | ICD-10-CM | POA: Diagnosis not present

## 2020-05-28 DIAGNOSIS — R195 Other fecal abnormalities: Secondary | ICD-10-CM | POA: Diagnosis not present

## 2020-05-28 DIAGNOSIS — E875 Hyperkalemia: Secondary | ICD-10-CM | POA: Diagnosis not present

## 2020-05-28 DIAGNOSIS — N183 Chronic kidney disease, stage 3 unspecified: Secondary | ICD-10-CM | POA: Diagnosis not present

## 2020-05-28 DIAGNOSIS — N184 Chronic kidney disease, stage 4 (severe): Secondary | ICD-10-CM | POA: Diagnosis not present

## 2020-05-28 DIAGNOSIS — E1169 Type 2 diabetes mellitus with other specified complication: Secondary | ICD-10-CM | POA: Diagnosis not present

## 2020-05-28 DIAGNOSIS — I129 Hypertensive chronic kidney disease with stage 1 through stage 4 chronic kidney disease, or unspecified chronic kidney disease: Secondary | ICD-10-CM | POA: Diagnosis not present

## 2020-05-28 NOTE — Telephone Encounter (Signed)
Patient has changed PCP and is no longer eligible for CCM through this provider. Patient has been unenrolled from CCM.

## 2020-05-29 ENCOUNTER — Telehealth: Payer: Self-pay | Admitting: Family

## 2020-05-29 ENCOUNTER — Ambulatory Visit (HOSPITAL_COMMUNITY)
Admission: RE | Admit: 2020-05-29 | Discharge: 2020-05-29 | Disposition: A | Discharge status: Home / Self Care | Payer: Medicare Other | Source: Ambulatory Visit | Attending: Nephrology | Admitting: Nephrology

## 2020-05-29 ENCOUNTER — Other Ambulatory Visit: Payer: Self-pay

## 2020-05-29 VITALS — BP 166/55 | HR 58 | Temp 97.2°F | Resp 20

## 2020-05-29 DIAGNOSIS — N1832 Chronic kidney disease, stage 3b: Secondary | ICD-10-CM | POA: Insufficient documentation

## 2020-05-29 LAB — POCT HEMOGLOBIN-HEMACUE: Hemoglobin: 9.6 g/dL — ABNORMAL LOW (ref 12.0–15.0)

## 2020-05-29 MED ORDER — EPOETIN ALFA-EPBX 40000 UNIT/ML IJ SOLN
INTRAMUSCULAR | Status: AC
  Administered 2020-05-29: 40000 [IU] via SUBCUTANEOUS
  Filled 2020-05-29: qty 1

## 2020-05-29 MED ORDER — EPOETIN ALFA-EPBX 40000 UNIT/ML IJ SOLN
40000.0000 [IU] | INTRAMUSCULAR | Status: DC
Start: 2020-05-29 — End: 2020-05-30

## 2020-05-29 NOTE — Telephone Encounter (Signed)
I have called pt and she was confused about the Home health. I have called the Home health agency back and informed them that [name] is no longer their provider.

## 2020-05-29 NOTE — Telephone Encounter (Signed)
Please call home health agency at [phone] and let them know that I am no longer patient's PCP; she has established with new provider in the [facility] network- according to [employer], it is Erby Pian. Please reach out to this provider for patient's future needs.   Regardless, I have not seen patient since November 2021 so I would not be able to order any home health for this patient.

## 2020-06-01 DIAGNOSIS — E1122 Type 2 diabetes mellitus with diabetic chronic kidney disease: Secondary | ICD-10-CM | POA: Diagnosis not present

## 2020-06-01 DIAGNOSIS — N185 Chronic kidney disease, stage 5: Secondary | ICD-10-CM | POA: Diagnosis not present

## 2020-06-01 DIAGNOSIS — N2581 Secondary hyperparathyroidism of renal origin: Secondary | ICD-10-CM | POA: Diagnosis not present

## 2020-06-01 DIAGNOSIS — D631 Anemia in chronic kidney disease: Secondary | ICD-10-CM | POA: Diagnosis not present

## 2020-06-01 DIAGNOSIS — R809 Proteinuria, unspecified: Secondary | ICD-10-CM | POA: Diagnosis not present

## 2020-06-01 DIAGNOSIS — I120 Hypertensive chronic kidney disease with stage 5 chronic kidney disease or end stage renal disease: Secondary | ICD-10-CM | POA: Diagnosis not present

## 2020-06-02 ENCOUNTER — Emergency Department (HOSPITAL_COMMUNITY): Payer: Medicare Other

## 2020-06-02 ENCOUNTER — Other Ambulatory Visit: Payer: Self-pay

## 2020-06-02 ENCOUNTER — Inpatient Hospital Stay (HOSPITAL_COMMUNITY)
Admission: EM | Admit: 2020-06-02 | Discharge: 2020-06-10 | DRG: 291 | Disposition: A | Discharge status: Home Health | Payer: Medicare Other | Attending: Internal Medicine | Admitting: Internal Medicine

## 2020-06-02 ENCOUNTER — Encounter (HOSPITAL_COMMUNITY): Payer: Self-pay | Admitting: Emergency Medicine

## 2020-06-02 DIAGNOSIS — G8929 Other chronic pain: Secondary | ICD-10-CM | POA: Diagnosis present

## 2020-06-02 DIAGNOSIS — N189 Chronic kidney disease, unspecified: Secondary | ICD-10-CM | POA: Diagnosis not present

## 2020-06-02 DIAGNOSIS — E1122 Type 2 diabetes mellitus with diabetic chronic kidney disease: Secondary | ICD-10-CM | POA: Diagnosis not present

## 2020-06-02 DIAGNOSIS — I517 Cardiomegaly: Secondary | ICD-10-CM | POA: Diagnosis not present

## 2020-06-02 DIAGNOSIS — E782 Mixed hyperlipidemia: Secondary | ICD-10-CM | POA: Diagnosis not present

## 2020-06-02 DIAGNOSIS — Z8249 Family history of ischemic heart disease and other diseases of the circulatory system: Secondary | ICD-10-CM

## 2020-06-02 DIAGNOSIS — R0989 Other specified symptoms and signs involving the circulatory and respiratory systems: Secondary | ICD-10-CM | POA: Diagnosis not present

## 2020-06-02 DIAGNOSIS — I129 Hypertensive chronic kidney disease with stage 1 through stage 4 chronic kidney disease, or unspecified chronic kidney disease: Secondary | ICD-10-CM | POA: Diagnosis not present

## 2020-06-02 DIAGNOSIS — D631 Anemia in chronic kidney disease: Secondary | ICD-10-CM | POA: Diagnosis present

## 2020-06-02 DIAGNOSIS — Z8719 Personal history of other diseases of the digestive system: Secondary | ICD-10-CM | POA: Diagnosis not present

## 2020-06-02 DIAGNOSIS — I160 Hypertensive urgency: Secondary | ICD-10-CM | POA: Diagnosis not present

## 2020-06-02 DIAGNOSIS — I130 Hypertensive heart and chronic kidney disease with heart failure and stage 1 through stage 4 chronic kidney disease, or unspecified chronic kidney disease: Principal | ICD-10-CM | POA: Diagnosis present

## 2020-06-02 DIAGNOSIS — R0602 Shortness of breath: Secondary | ICD-10-CM | POA: Diagnosis present

## 2020-06-02 DIAGNOSIS — I5021 Acute systolic (congestive) heart failure: Secondary | ICD-10-CM | POA: Diagnosis not present

## 2020-06-02 DIAGNOSIS — Z87891 Personal history of nicotine dependence: Secondary | ICD-10-CM

## 2020-06-02 DIAGNOSIS — Z833 Family history of diabetes mellitus: Secondary | ICD-10-CM

## 2020-06-02 DIAGNOSIS — Z7982 Long term (current) use of aspirin: Secondary | ICD-10-CM

## 2020-06-02 DIAGNOSIS — E0590 Thyrotoxicosis, unspecified without thyrotoxic crisis or storm: Secondary | ICD-10-CM | POA: Diagnosis present

## 2020-06-02 DIAGNOSIS — R778 Other specified abnormalities of plasma proteins: Secondary | ICD-10-CM | POA: Diagnosis not present

## 2020-06-02 DIAGNOSIS — R0902 Hypoxemia: Secondary | ICD-10-CM | POA: Diagnosis not present

## 2020-06-02 DIAGNOSIS — D649 Anemia, unspecified: Secondary | ICD-10-CM | POA: Diagnosis present

## 2020-06-02 DIAGNOSIS — R7989 Other specified abnormal findings of blood chemistry: Secondary | ICD-10-CM | POA: Diagnosis present

## 2020-06-02 DIAGNOSIS — N281 Cyst of kidney, acquired: Secondary | ICD-10-CM | POA: Diagnosis not present

## 2020-06-02 DIAGNOSIS — E875 Hyperkalemia: Secondary | ICD-10-CM | POA: Diagnosis not present

## 2020-06-02 DIAGNOSIS — N184 Chronic kidney disease, stage 4 (severe): Secondary | ICD-10-CM | POA: Diagnosis present

## 2020-06-02 DIAGNOSIS — E1129 Type 2 diabetes mellitus with other diabetic kidney complication: Secondary | ICD-10-CM | POA: Diagnosis not present

## 2020-06-02 DIAGNOSIS — Z8673 Personal history of transient ischemic attack (TIA), and cerebral infarction without residual deficits: Secondary | ICD-10-CM

## 2020-06-02 DIAGNOSIS — E1169 Type 2 diabetes mellitus with other specified complication: Secondary | ICD-10-CM | POA: Diagnosis present

## 2020-06-02 DIAGNOSIS — Z83438 Family history of other disorder of lipoprotein metabolism and other lipidemia: Secondary | ICD-10-CM

## 2020-06-02 DIAGNOSIS — E785 Hyperlipidemia, unspecified: Secondary | ICD-10-CM | POA: Diagnosis not present

## 2020-06-02 DIAGNOSIS — K5900 Constipation, unspecified: Secondary | ICD-10-CM | POA: Diagnosis not present

## 2020-06-02 DIAGNOSIS — I5033 Acute on chronic diastolic (congestive) heart failure: Secondary | ICD-10-CM | POA: Diagnosis not present

## 2020-06-02 DIAGNOSIS — N179 Acute kidney failure, unspecified: Secondary | ICD-10-CM | POA: Diagnosis present

## 2020-06-02 DIAGNOSIS — E11319 Type 2 diabetes mellitus with unspecified diabetic retinopathy without macular edema: Secondary | ICD-10-CM | POA: Diagnosis present

## 2020-06-02 DIAGNOSIS — Z888 Allergy status to other drugs, medicaments and biological substances status: Secondary | ICD-10-CM

## 2020-06-02 DIAGNOSIS — E1165 Type 2 diabetes mellitus with hyperglycemia: Secondary | ICD-10-CM | POA: Diagnosis not present

## 2020-06-02 DIAGNOSIS — J9601 Acute respiratory failure with hypoxia: Secondary | ICD-10-CM | POA: Diagnosis not present

## 2020-06-02 DIAGNOSIS — R011 Cardiac murmur, unspecified: Secondary | ICD-10-CM | POA: Diagnosis present

## 2020-06-02 DIAGNOSIS — I509 Heart failure, unspecified: Secondary | ICD-10-CM

## 2020-06-02 DIAGNOSIS — Z20822 Contact with and (suspected) exposure to covid-19: Secondary | ICD-10-CM | POA: Diagnosis not present

## 2020-06-02 DIAGNOSIS — K219 Gastro-esophageal reflux disease without esophagitis: Secondary | ICD-10-CM | POA: Diagnosis not present

## 2020-06-02 DIAGNOSIS — Z7984 Long term (current) use of oral hypoglycemic drugs: Secondary | ICD-10-CM

## 2020-06-02 DIAGNOSIS — Z90710 Acquired absence of both cervix and uterus: Secondary | ICD-10-CM

## 2020-06-02 DIAGNOSIS — M1712 Unilateral primary osteoarthritis, left knee: Secondary | ICD-10-CM | POA: Diagnosis not present

## 2020-06-02 DIAGNOSIS — M5416 Radiculopathy, lumbar region: Secondary | ICD-10-CM | POA: Diagnosis not present

## 2020-06-02 DIAGNOSIS — M109 Gout, unspecified: Secondary | ICD-10-CM | POA: Diagnosis not present

## 2020-06-02 DIAGNOSIS — I110 Hypertensive heart disease with heart failure: Secondary | ICD-10-CM | POA: Diagnosis not present

## 2020-06-02 DIAGNOSIS — Z79899 Other long term (current) drug therapy: Secondary | ICD-10-CM

## 2020-06-02 LAB — CBC WITH DIFFERENTIAL/PLATELET
Abs Immature Granulocytes: 0.03 10*3/uL (ref 0.00–0.07)
Basophils Absolute: 0 10*3/uL (ref 0.0–0.1)
Basophils Relative: 1 %
Eosinophils Absolute: 0.2 10*3/uL (ref 0.0–0.5)
Eosinophils Relative: 3 %
HCT: 27.1 % — ABNORMAL LOW (ref 36.0–46.0)
Hemoglobin: 8.7 g/dL — ABNORMAL LOW (ref 12.0–15.0)
Immature Granulocytes: 0 %
Lymphocytes Relative: 16 %
Lymphs Abs: 1.4 10*3/uL (ref 0.7–4.0)
MCH: 30.5 pg (ref 26.0–34.0)
MCHC: 32.1 g/dL (ref 30.0–36.0)
MCV: 95.1 fL (ref 80.0–100.0)
Monocytes Absolute: 0.9 10*3/uL (ref 0.1–1.0)
Monocytes Relative: 11 %
Neutro Abs: 5.7 10*3/uL (ref 1.7–7.7)
Neutrophils Relative %: 69 %
Platelets: 286 10*3/uL (ref 150–400)
RBC: 2.85 MIL/uL — ABNORMAL LOW (ref 3.87–5.11)
RDW: 15.2 % (ref 11.5–15.5)
WBC: 8.3 10*3/uL (ref 4.0–10.5)
nRBC: 0.5 % — ABNORMAL HIGH (ref 0.0–0.2)

## 2020-06-02 NOTE — ED Triage Notes (Signed)
Pt c/o shortness of breath x 2-3 days. Denies chest pain. C/o constipation and bloating, also reports burning with urination "every now and then". Reports seeing blood in her stool this week as well.

## 2020-06-02 NOTE — ED Provider Notes (Signed)
Emergency Medicine Provider Triage Evaluation Note  [name] , a 70 y.o. female  was evaluated in triage.  Pt complains of shortness of breath.  States her symptoms are 2 days ago.  Denies any chest pain.  Reports constipation as well as diffuse abdominal bloating.  States she is having regular bowel movements but they are "hard and small.  Also reports intermittent dysuria.  She has a history of anemia as well as recent admission for gastrointestinal hemorrhage.  She states 2 days ago she was straining to have a bowel movement and noticed a "small amount of blood" during that BM.  Denies any melena.  Reports congestion but no cough or sore throat.  She has been vaccinated for COVID-19 x3. She is a former smoker.  Denies any history of COPD.  Physical Exam  There were no vitals taken for this visit. Gen:   Awake, no distress   Resp:  Normal effort  MSK:   Moves extremities without difficulty  Other:    Medical Decision Making  Medically screening exam initiated at 11:17 PM.  Appropriate orders placed.  [name] was informed that the remainder of the evaluation will be completed by another provider, this initial triage assessment does not replace that evaluation, and the importance of remaining in the ED until their evaluation is complete.     Rayna Sexton, PA-C 06/02/20 2333    Lucrezia Starch, MD 06/02/20 [phone]

## 2020-06-03 ENCOUNTER — Encounter (HOSPITAL_COMMUNITY): Payer: Self-pay | Admitting: Internal Medicine

## 2020-06-03 ENCOUNTER — Emergency Department (HOSPITAL_COMMUNITY): Payer: Medicare Other

## 2020-06-03 ENCOUNTER — Observation Stay (HOSPITAL_COMMUNITY): Payer: Medicare Other

## 2020-06-03 DIAGNOSIS — R0602 Shortness of breath: Secondary | ICD-10-CM | POA: Diagnosis not present

## 2020-06-03 DIAGNOSIS — E782 Mixed hyperlipidemia: Secondary | ICD-10-CM

## 2020-06-03 DIAGNOSIS — R778 Other specified abnormalities of plasma proteins: Secondary | ICD-10-CM | POA: Diagnosis not present

## 2020-06-03 DIAGNOSIS — E1169 Type 2 diabetes mellitus with other specified complication: Secondary | ICD-10-CM

## 2020-06-03 DIAGNOSIS — I160 Hypertensive urgency: Secondary | ICD-10-CM

## 2020-06-03 DIAGNOSIS — K219 Gastro-esophageal reflux disease without esophagitis: Secondary | ICD-10-CM

## 2020-06-03 DIAGNOSIS — E1122 Type 2 diabetes mellitus with diabetic chronic kidney disease: Secondary | ICD-10-CM

## 2020-06-03 DIAGNOSIS — N184 Chronic kidney disease, stage 4 (severe): Secondary | ICD-10-CM

## 2020-06-03 DIAGNOSIS — I5021 Acute systolic (congestive) heart failure: Secondary | ICD-10-CM

## 2020-06-03 LAB — GLUCOSE, CAPILLARY: Glucose-Capillary: 115 mg/dL — ABNORMAL HIGH (ref 70–99)

## 2020-06-03 LAB — RESP PANEL BY RT-PCR (FLU A&B, COVID) ARPGX2
Influenza A by PCR: NEGATIVE
Influenza B by PCR: NEGATIVE
SARS Coronavirus 2 by RT PCR: NEGATIVE

## 2020-06-03 LAB — CBC WITH DIFFERENTIAL/PLATELET
Abs Immature Granulocytes: 0.03 10*3/uL (ref 0.00–0.07)
Basophils Absolute: 0 10*3/uL (ref 0.0–0.1)
Basophils Relative: 0 %
Eosinophils Absolute: 0.2 10*3/uL (ref 0.0–0.5)
Eosinophils Relative: 3 %
HCT: 24.8 % — ABNORMAL LOW (ref 36.0–46.0)
Hemoglobin: 8.1 g/dL — ABNORMAL LOW (ref 12.0–15.0)
Immature Granulocytes: 0 %
Lymphocytes Relative: 19 %
Lymphs Abs: 1.3 10*3/uL (ref 0.7–4.0)
MCH: 31.4 pg (ref 26.0–34.0)
MCHC: 32.7 g/dL (ref 30.0–36.0)
MCV: 96.1 fL (ref 80.0–100.0)
Monocytes Absolute: 0.9 10*3/uL (ref 0.1–1.0)
Monocytes Relative: 12 %
Neutro Abs: 4.5 10*3/uL (ref 1.7–7.7)
Neutrophils Relative %: 66 %
Platelets: 274 10*3/uL (ref 150–400)
RBC: 2.58 MIL/uL — ABNORMAL LOW (ref 3.87–5.11)
RDW: 15.6 % — ABNORMAL HIGH (ref 11.5–15.5)
WBC: 6.9 10*3/uL (ref 4.0–10.5)
nRBC: 0.6 % — ABNORMAL HIGH (ref 0.0–0.2)

## 2020-06-03 LAB — COMPREHENSIVE METABOLIC PANEL
ALT: 44 U/L (ref 0–44)
AST: 26 U/L (ref 15–41)
Albumin: 3.2 g/dL — ABNORMAL LOW (ref 3.5–5.0)
Alkaline Phosphatase: 108 U/L (ref 38–126)
Anion gap: 10 (ref 5–15)
BUN: 72 mg/dL — ABNORMAL HIGH (ref 8–23)
CO2: 24 mmol/L (ref 22–32)
Calcium: 8.6 mg/dL — ABNORMAL LOW (ref 8.9–10.3)
Chloride: 101 mmol/L (ref 98–111)
Creatinine, Ser: 3.47 mg/dL — ABNORMAL HIGH (ref 0.44–1.00)
GFR, Estimated: 14 mL/min — ABNORMAL LOW (ref >60)
Glucose, Bld: 117 mg/dL — ABNORMAL HIGH (ref 70–99)
Potassium: 3.8 mmol/L (ref 3.5–5.1)
Sodium: 135 mmol/L (ref 135–145)
Total Bilirubin: 1.9 mg/dL — ABNORMAL HIGH (ref 0.3–1.2)
Total Protein: 6.8 g/dL (ref 6.5–8.1)

## 2020-06-03 LAB — ECHOCARDIOGRAM COMPLETE
AR max vel: 1.6 cm2
AV Area VTI: 1.57 cm2
AV Area mean vel: 1.72 cm2
AV Mean grad: 13.5 mmHg
AV Peak grad: 24.6 mmHg
Ao pk vel: 2.48 m/s
Area-P 1/2: 2.62 cm2
S' Lateral: 2.2 cm

## 2020-06-03 LAB — CBG MONITORING, ED
Glucose-Capillary: 74 mg/dL (ref 70–99)
Glucose-Capillary: 62 mg/dL — ABNORMAL LOW (ref 70–99)
Glucose-Capillary: 91 mg/dL (ref 70–99)
Glucose-Capillary: 135 mg/dL — ABNORMAL HIGH (ref 70–99)

## 2020-06-03 LAB — TROPONIN I (HIGH SENSITIVITY)
Troponin I (High Sensitivity): 20 ng/L — ABNORMAL HIGH (ref <18)
Troponin I (High Sensitivity): 26 ng/L — ABNORMAL HIGH (ref <18)

## 2020-06-03 LAB — HEMOGLOBIN A1C
Hgb A1c MFr Bld: 6.2 % — ABNORMAL HIGH (ref 4.8–5.6)
Mean Plasma Glucose: 131.24 mg/dL

## 2020-06-03 LAB — BRAIN NATRIURETIC PEPTIDE: B Natriuretic Peptide: 669.8 pg/mL — ABNORMAL HIGH (ref 0.0–100.0)

## 2020-06-03 MED ORDER — POLYETHYLENE GLYCOL 3350 17 G PO PACK
17.0000 g | PACK | Freq: Every day | ORAL | Status: DC
  Administered 2020-06-03 – 2020-06-10 (×7): 17 g via ORAL
  Filled 2020-06-03 (×8): qty 1

## 2020-06-03 MED ORDER — METOPROLOL SUCCINATE ER 100 MG PO TB24
100.0000 mg | ORAL_TABLET | Freq: Every day | ORAL | Status: DC
  Administered 2020-06-03 – 2020-06-10 (×8): 100 mg via ORAL
  Filled 2020-06-03: qty 4
  Filled 2020-06-03 (×7): qty 1

## 2020-06-03 MED ORDER — ACETAMINOPHEN 325 MG PO TABS
650.0000 mg | ORAL_TABLET | ORAL | Status: DC | PRN
  Filled 2020-06-03: qty 2

## 2020-06-03 MED ORDER — PANTOPRAZOLE SODIUM 40 MG PO TBEC
40.0000 mg | DELAYED_RELEASE_TABLET | Freq: Every day | ORAL | Status: DC
  Administered 2020-06-03 – 2020-06-10 (×8): 40 mg via ORAL
  Filled 2020-06-03 (×8): qty 1

## 2020-06-03 MED ORDER — FUROSEMIDE 10 MG/ML IJ SOLN
60.0000 mg | Freq: Once | INTRAMUSCULAR | Status: AC
  Administered 2020-06-03: 60 mg via INTRAVENOUS
  Filled 2020-06-03: qty 6

## 2020-06-03 MED ORDER — AMLODIPINE BESYLATE 10 MG PO TABS
10.0000 mg | ORAL_TABLET | Freq: Every day | ORAL | Status: DC
  Administered 2020-06-03 – 2020-06-10 (×8): 10 mg via ORAL
  Filled 2020-06-03 (×7): qty 1
  Filled 2020-06-03: qty 2

## 2020-06-03 MED ORDER — ENOXAPARIN SODIUM 30 MG/0.3ML IJ SOSY
30.0000 mg | PREFILLED_SYRINGE | Freq: Every day | INTRAMUSCULAR | Status: DC
  Administered 2020-06-03 – 2020-06-10 (×8): 30 mg via SUBCUTANEOUS
  Filled 2020-06-03 (×9): qty 0.3

## 2020-06-03 MED ORDER — SODIUM CHLORIDE 0.9% FLUSH
3.0000 mL | Freq: Two times a day (BID) | INTRAVENOUS | Status: DC
  Administered 2020-06-03 – 2020-06-10 (×15): 3 mL via INTRAVENOUS

## 2020-06-03 MED ORDER — HYDRALAZINE HCL 50 MG PO TABS
50.0000 mg | ORAL_TABLET | Freq: Three times a day (TID) | ORAL | Status: DC
  Administered 2020-06-03 – 2020-06-10 (×18): 50 mg via ORAL
  Filled 2020-06-03 (×21): qty 1

## 2020-06-03 MED ORDER — ONDANSETRON HCL 4 MG/2ML IJ SOLN
4.0000 mg | Freq: Four times a day (QID) | INTRAMUSCULAR | Status: DC | PRN
Start: 2020-06-03 — End: 2020-06-10
  Administered 2020-06-04 – 2020-06-10 (×2): 4 mg via INTRAVENOUS
  Filled 2020-06-03 (×2): qty 2

## 2020-06-03 MED ORDER — FUROSEMIDE 10 MG/ML IJ SOLN
INTRAMUSCULAR | Status: AC
  Administered 2020-06-03: 60 mg via INTRAVENOUS
  Filled 2020-06-03: qty 6

## 2020-06-03 MED ORDER — BISACODYL 10 MG RE SUPP
10.0000 mg | Freq: Once | RECTAL | Status: AC
  Administered 2020-06-03: 10 mg via RECTAL
  Filled 2020-06-03: qty 1

## 2020-06-03 MED ORDER — CLONIDINE HCL 0.2 MG PO TABS
0.2000 mg | ORAL_TABLET | Freq: Three times a day (TID) | ORAL | Status: DC
  Administered 2020-06-03 – 2020-06-04 (×3): 0.2 mg via ORAL
  Filled 2020-06-03 (×5): qty 1

## 2020-06-03 MED ORDER — INSULIN ASPART 100 UNIT/ML IJ SOLN
0.0000 [IU] | Freq: Three times a day (TID) | INTRAMUSCULAR | Status: DC
  Administered 2020-06-03: 1 [IU] via SUBCUTANEOUS
  Administered 2020-06-04 (×2): 2 [IU] via SUBCUTANEOUS
  Administered 2020-06-04: 1 [IU] via SUBCUTANEOUS
  Administered 2020-06-04 – 2020-06-06 (×5): 2 [IU] via SUBCUTANEOUS
  Administered 2020-06-06: 1 [IU] via SUBCUTANEOUS
  Administered 2020-06-06 – 2020-06-07 (×4): 2 [IU] via SUBCUTANEOUS
  Administered 2020-06-07: 3 [IU] via SUBCUTANEOUS
  Administered 2020-06-07: 2 [IU] via SUBCUTANEOUS
  Administered 2020-06-08: 5 [IU] via SUBCUTANEOUS
  Administered 2020-06-08 (×2): 2 [IU] via SUBCUTANEOUS
  Administered 2020-06-08: 1 [IU] via SUBCUTANEOUS
  Administered 2020-06-09 – 2020-06-10 (×4): 2 [IU] via SUBCUTANEOUS

## 2020-06-03 MED ORDER — FUROSEMIDE 10 MG/ML IJ SOLN
80.0000 mg | Freq: Three times a day (TID) | INTRAMUSCULAR | Status: DC
  Administered 2020-06-03 – 2020-06-04 (×4): 80 mg via INTRAVENOUS
  Filled 2020-06-03 (×4): qty 8

## 2020-06-03 MED ORDER — ALLOPURINOL 100 MG PO TABS
100.0000 mg | ORAL_TABLET | Freq: Every day | ORAL | Status: DC
  Administered 2020-06-04 – 2020-06-10 (×7): 100 mg via ORAL
  Filled 2020-06-03 (×8): qty 1

## 2020-06-03 MED ORDER — BROMOCRIPTINE MESYLATE 2.5 MG PO TABS
2.5000 mg | ORAL_TABLET | Freq: Every day | ORAL | Status: DC
  Administered 2020-06-04 – 2020-06-10 (×7): 2.5 mg via ORAL
  Filled 2020-06-03 (×8): qty 1

## 2020-06-03 MED ORDER — SODIUM CHLORIDE 0.9 % IV SOLN: 250.0000 mL | INTRAVENOUS | Status: DC | PRN

## 2020-06-03 MED ORDER — FUROSEMIDE 10 MG/ML IJ SOLN
60.0000 mg | Freq: Two times a day (BID) | INTRAMUSCULAR | Status: DC
  Administered 2020-06-03: 60 mg via INTRAVENOUS
  Filled 2020-06-03: qty 6

## 2020-06-03 MED ORDER — ATORVASTATIN CALCIUM 80 MG PO TABS
80.0000 mg | ORAL_TABLET | Freq: Every day | ORAL | Status: DC
  Administered 2020-06-03 – 2020-06-10 (×8): 80 mg via ORAL
  Filled 2020-06-03 (×7): qty 1
  Filled 2020-06-03: qty 2

## 2020-06-03 MED ORDER — MINOXIDIL 10 MG PO TABS
10.0000 mg | ORAL_TABLET | Freq: Two times a day (BID) | ORAL | Status: DC
  Administered 2020-06-03 – 2020-06-10 (×13): 10 mg via ORAL
  Filled 2020-06-03 (×16): qty 1

## 2020-06-03 MED ORDER — ACARBOSE 25 MG PO TABS
12.5000 mg | ORAL_TABLET | Freq: Three times a day (TID) | ORAL | Status: DC
  Administered 2020-06-03: 12.5 mg via ORAL
  Filled 2020-06-03 (×4): qty 1

## 2020-06-03 MED ORDER — POLYETHYLENE GLYCOL 3350 17 G PO PACK: 34.0000 g | PACK | Freq: Every day | ORAL | Status: DC | PRN

## 2020-06-03 MED ORDER — REPAGLINIDE 2 MG PO TABS
2.0000 mg | ORAL_TABLET | Freq: Two times a day (BID) | ORAL | Status: DC
  Filled 2020-06-03 (×3): qty 1

## 2020-06-03 MED ORDER — SODIUM CHLORIDE 0.9% FLUSH: 3.0000 mL | INTRAVENOUS | Status: DC | PRN

## 2020-06-03 MED ORDER — ISOSORBIDE MONONITRATE ER 30 MG PO TB24
30.0000 mg | ORAL_TABLET | Freq: Every day | ORAL | Status: DC
  Administered 2020-06-03 – 2020-06-10 (×8): 30 mg via ORAL
  Filled 2020-06-03 (×8): qty 1

## 2020-06-03 NOTE — ED Notes (Signed)
While ambulating pt O2 maintained at 90-92% on room air. PA made aware. Pt ambulated to and from the restroom.

## 2020-06-03 NOTE — Progress Notes (Signed)
Inpatient Diabetes Program Recommendations  AACE/ADA: New Consensus Statement on Inpatient Glycemic Control (2015)  Target Ranges:  Prepandial:   less than 140 mg/dL      Peak postprandial:   less than 180 mg/dL (1-2 hours)      Critically ill patients:  140 - 180 mg/dL   Lab Results  Component Value Date   GLUCAP 91 06/03/2020   HGBA1C 6.2 (H) 06/03/2020    Review of Glycemic Control Results for [name], [name] (MRN [id number]) as of 06/03/2020 14:35  Ref. Range 06/03/2020 08:52 06/03/2020 12:14 06/03/2020 12:54  Glucose-Capillary Latest Ref Range: 70 - 99 mg/dL 74 62 (L) 91   Diabetes history: DM2 Outpatient Diabetes medications: Prandin 2 mg tid ac meals Current orders for Inpatient glycemic control: Novolog 0-9 units qid + Precose 12.5 mg tid ac meals + Prandin 2 mg bid ac meals  Inpatient Diabetes Program Recommendations:   Patient currently in the ED. -D/C oral diabetes medications while in the hospital Patient probably only needs one of the meds to assist with meals on discharge. Secure chat sent to [name].  Thank you, Nani Gasser. Marquelle Musgrave, RN, MSN, CDE  Diabetes Coordinator Inpatient Glycemic Control Team Team Pager [phone] (8am-5pm) 06/03/2020 2:37 PM

## 2020-06-03 NOTE — Progress Notes (Signed)
  Echocardiogram 2D Echocardiogram has been performed.  Merrie Roof F 06/03/2020, 1:19 PM

## 2020-06-03 NOTE — Progress Notes (Signed)
[facility] short progress note  H and P and chart reviewed and patient examined. Recently admitted for possible upper GI bleed and acute gout.  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 Recommend PYP scan to evaluate for TTR amyloid and  SPEP/UPEP/light chains to evaluate for AL amyloid -cont IV lasix- increase to 80 IV TID due to CKD 4   - she still feels short of breath, has crackles at bases and pedal edema  Active Problems:   Hypertensive urgency - on multiple meds including amlodipine, Clonidine, Toprol, Minoxidil- add Hydralazine and Imdur   Type 2 diabetes mellitus  - dc Prandin and Acarbose - cont SSI   - follows with Dr Loanne Drilling     Anemia/ h/o GERD - recent GI bleed- Hb 9.6 > 8.1 today - ? If diluted due to being fluid overloaded - cont PPI  Note: Bromocriptine on med list- patient does not know why she is on this   Debbe Odea, MD

## 2020-06-03 NOTE — ED Notes (Signed)
Pt given orange juice for low CBG per RN order. RN notified.

## 2020-06-03 NOTE — Plan of Care (Signed)

## 2020-06-03 NOTE — Progress Notes (Signed)
Heart Failure Navigator Progress Note  Assessed for Heart & Vascular TOC clinic readiness.  Unfortunately at this time the patient does not meet criteria due to CKD IV, SCr 3.47+.   Navigator available for reassessment of patient if significant improvement of renal function.   Pricilla Holm, RN, BSN Heart Failure Nurse Navigator [phone]

## 2020-06-03 NOTE — ED Provider Notes (Signed)
[facility] EMERGENCY DEPARTMENT Provider Note   CSN: [id number] Arrival date & time: 06/02/20  2307     History Chief Complaint  Patient presents with  . Shortness of Breath    [name] is a 70 y.o. female with a history of diabetes mellitus type 2, CKD stage IV, GI bleed, HTN, hypothyroidism, lumbar radiculopathy, chronic back pain, constipation who presents the emergency department with a chief complaint of shortness of breath.  The patient endorses intermittent shortness of breath that began yesterday.  Reports that she was feeling short of breath yesterday but then resolved before returning today.  She reports that shortness of breath has been constant today.  She reports that her legs have also been swelling and her abdomen appears more swollen, but she is concerned that this is because she is constipated.  Reports that she has had 2 episodes where she has passed small, hard balls of stool.  Several days ago, she noticed bright red blood in her stool, but when she had a bowel movement earlier today no hematochezia or melena was noted.  Yesterday, she saw her nephrologist who increased her home Lasix from 40 mg daily to 80 mg daily.  She has now had 2 doses of 80 mg.  She reports associated intermittent lightheadedness with standing and chills.  Reports that she has also been sleeping with 2 pillows.  She denies chest pain, back pain, flank pain, nausea, vomiting, diarrhea, fever, nasal congestion, rhinorrhea,  The history is provided by the patient and medical records. No language interpreter was used.       Past Medical History:  Diagnosis Date  . Acute kidney injury ([place]) 01/08/2015  . Arthritis   . Chronic back pain   . Constipation   . Cough   . Diabetes mellitus, type 2 ([place])   . Diverticulosis   . Fibroid    patient thinks this was the reason for her hysterectomy  . GERD (gastroesophageal reflux disease)   . Heart murmur   . History of  sebaceous cyst   . Hyperlipemia   . Hyperplastic colon polyp   . Hypertension   . Hyperthyroidism   . Lumbar radiculopathy   . Shortness of breath 09/13/2013  . Stroke ([place]) 2015?   x4   . Tobacco abuse   . 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 Shortness of breath 06/03/2020  . Heme positive stool   . GI bleed 05/18/2020  . CKD (chronic kidney disease) stage 3, GFR 30-59 ml/min (HCC) 05/18/2020  . Knee pain 05/18/2020  . Right flank discomfort 11/28/2019  . Vitamin D toxicity, accidental or unintentional, initial encounter 09/09/2017  . Ataxia 09/09/2017  . Type 2 diabetes mellitus with hyperglycemia ([place]) 09/09/2017  . Chronic kidney disease (CKD)   . Olecranon bursitis of right elbow 03/09/2017  . Thyroid nodule 06/29/2016  . 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 Vitamin D deficiency 01/12/2016  . Medicare annual wellness visit, initial 01/11/2016  . Lacunar infarct, acute ([place]) 03/23/2015  . Non compliance w medication regimen 01/16/2015  . Anemia 01/08/2015  . Stroke ([place]) 01/08/2015  . Hyperlipemia   . Carotid bruit present 12/13/2012  . Routine health maintenance 10/24/2010  . PERIPHERAL EDEMA 09/10/2009  . ELECTROCARDIOGRAM, ABNORMAL 04/11/2008  . HEART MURMUR,  SYSTOLIC 04/09/2008  . SEBACEOUS CYST, NECK 12/13/2006  . Hyperthyroidism 09/27/2006  . Secondary diabetes with peripheral vascular disease (HCC) 09/27/2006  . Hyperlipidemia LDL goal <70 09/27/2006  . TOBACCO ABUSE 09/27/2006  . Essential hypertension 09/27/2006    Past Surgical History:  Procedure Laterality Date  . ABDOMINAL HYSTERECTOMY     --?ovaries remain  . COLONOSCOPY W/ BIOPSIES  09/11/2008  . ESOPHAGOGASTRODUODENOSCOPY (EGD) WITH PROPOFOL N/A 05/20/2020   Procedure:  ESOPHAGOGASTRODUODENOSCOPY (EGD) WITH PROPOFOL;  Surgeon: Hilarie Fredrickson, MD;  Location: [facility] ENDOSCOPY;  Service: Endoscopy;  Laterality: N/A;  . RETINOPATHY SURGERY Bilateral 2013   Dr. Ashley Royalty     OB History    Gravida  1   Para  1   Term      Preterm      AB      Living  1     SAB      IAB      Ectopic      Multiple      Live Births              Family History  Problem Relation Age of Onset  . Hypertension Mother   . Sleep apnea Mother   . Diabetes Mother   . Hyperlipidemia Mother   . Lung cancer Father        smoker  . Hypertension Sister   . Diabetes Sister   . Hypertension Brother   . Hypertension Sister   . Diabetes Brother   . Hypertension Brother   . Breast cancer Neg Hx   . Colon cancer Neg Hx   . Esophageal cancer Neg Hx   . Pancreatic cancer Neg Hx   . Liver disease Neg Hx   . Colon polyps Neg Hx     Social History   Tobacco Use  . Smoking status: Former Smoker    Packs/day: 0.50    Years: 35.00    Pack years: 17.50    Types: Cigarettes    Quit date: 07/07/2014    Years since quitting: 5.9  . Smokeless tobacco: Never Used  Vaping Use  . Vaping Use: Never used  Substance Use Topics  . Alcohol use: No  . Drug use: No    Home Medications Prior to Admission medications   Medication Sig Start Date End Date Taking? Authorizing Provider  acarbose (PRECOSE) 25 MG tablet Take 0.5 tablets (12.5 mg total) by mouth 3 (three) times daily with meals. 02/25/20   Romero Belling, MD  allopurinol (ZYLOPRIM) 100 MG tablet Take 1 tablet (100 mg total) by mouth daily. 05/23/20   Ghimire, Werner Lean, MD  amLODipine (NORVASC) 10 MG tablet TAKE 1 TABLET BY MOUTH EVERY DAY 03/16/20   Olive Bass, FNP  atorvastatin (LIPITOR) 80 MG tablet TAKE 1 TABLET BY MOUTH EVERY DAY 03/30/20   Olive Bass, FNP  Blood Glucose Monitoring Suppl (ONETOUCH VERIO FLEX SYSTEM) w/Device KIT 1 each by Does not apply route 2 (two) times daily. E11.9     [provider]  bromocriptine (PARLODEL) 2.5 MG tablet Take 0.5 tablets (1.25 mg total) by mouth daily. Patient taking differently: Take 2.5 mg by mouth daily. 02/24/20   Romero Belling, MD  cloNIDine (CATAPRES) 0.2 MG tablet Take 1 tablet (0.2 mg total) by mouth 3 (three) times daily. 05/21/19   Olive Bass, FNP  epoetin alfa-epbx (RETACRIT) 79105 UNIT/ML injection Inject 40,000 Units into the skin every 28 (twenty-eight) days.    [provider]  feeding supplement (ENSURE ENLIVE / ENSURE PLUS) LIQD Take 237 mLs by mouth 2 (two) times daily between meals. 05/22/20 06/21/20  Ghimire, Henreitta Leber, MD  furosemide (LASIX) 40 MG tablet Take 40 mg by mouth daily as needed for fluid or edema.    [provider]  glucose blood (ONETOUCH VERIO) test strip USE AS DIRECTED TWICE DAILY 12/12/19   Marrian Salvage, FNP  metoprolol succinate (TOPROL-XL) 100 MG 24 hr tablet Take 100 mg by mouth daily. Take with or immediately following a meal.    [provider]  minoxidil (LONITEN) 10 MG tablet Take 10 mg by mouth 2 (two) times daily.    [provider]  OneTouch Delica Lancets 86V MISC 1 each by Does not apply route 2 (two) times daily. E11.9    [provider]  pantoprazole (PROTONIX) 40 MG tablet Take 1 tablet (40 mg total) by mouth daily. 06/12/19   Marrian Salvage, FNP  polyethylene glycol (MIRALAX / GLYCOLAX) 17 g packet Take 34-51 g by mouth every other day.    [provider]  repaglinide (PRANDIN) 2 MG tablet Take 1 tablet (2 mg total) by mouth 2 (two) times daily before a meal. 01/10/20   Renato Shin, MD    Allergies    Semaglutide  Review of Systems   Review of Systems  Constitutional: Positive for unexpected weight change. Negative for activity change, chills, diaphoresis and fever.  HENT: Negative for congestion.   Respiratory: Positive for shortness of breath.   Cardiovascular: Positive for leg swelling.  Negative for chest pain and palpitations.  Gastrointestinal: Positive for abdominal distention. Negative for abdominal pain, diarrhea, nausea and vomiting.  Genitourinary: Negative for dysuria, frequency and urgency.  Musculoskeletal: Negative for back pain, myalgias, neck pain and neck stiffness.  Skin: Negative for rash and wound.  Allergic/Immunologic: Negative for immunocompromised state.  Neurological: Positive for light-headedness. Negative for dizziness, seizures, syncope, weakness and headaches.  Psychiatric/Behavioral: Negative for confusion.    Physical Exam Updated Vital Signs BP (!) 185/68 (BP Location: Right Arm)   Pulse 66   Temp 98.1 F (36.7 C) (Oral)   Resp 18   SpO2 98%   Physical Exam Vitals and nursing note reviewed.  Constitutional:      General: She is not in acute distress.    Appearance: She is not ill-appearing, toxic-appearing or diaphoretic.  HENT:     Head: Normocephalic.  Eyes:     Conjunctiva/sclera: Conjunctivae normal.  Cardiovascular:     Rate and Rhythm: Normal rate and regular rhythm.     Heart sounds: Murmur heard.  No friction rub. No gallop.   Pulmonary:     Effort: Pulmonary effort is normal. No respiratory distress.     Comments: Crackles in the bilateral lung bases.  Patient is able to speak in complete, fluent sentences. Abdominal:     General: There is distension.     Palpations: Abdomen is soft.     Comments: Positive fluid wave.  No tenderness to palpation.  Abdomen is distended, but soft.  Musculoskeletal:     Cervical back: Neck supple.     Comments: 3+ pitting edema to the bilateral lower extremities.  Skin:    General: Skin is warm.     Coloration: Skin is not jaundiced or pale.     Findings: No rash.  Neurological:     Mental Status: She is alert.  Psychiatric:        Behavior: Behavior normal.  ED Results / Procedures / Treatments   Labs (all labs ordered are listed, but only abnormal results are  displayed) Labs Reviewed  COMPREHENSIVE METABOLIC PANEL - Abnormal; Notable for the following components:      Result Value   Glucose, Bld 117 (*)    BUN 72 (*)    Creatinine, Ser 3.47 (*)    Calcium 8.6 (*)    Albumin 3.2 (*)    Total Bilirubin 1.9 (*)    GFR, Estimated 14 (*)    All other components within normal limits  CBC WITH DIFFERENTIAL/PLATELET - Abnormal; Notable for the following components:   RBC 2.85 (*)    Hemoglobin 8.7 (*)    HCT 27.1 (*)    nRBC 0.5 (*)    All other components within normal limits  BRAIN NATRIURETIC PEPTIDE - Abnormal; Notable for the following components:   B Natriuretic Peptide 669.8 (*)    All other components within normal limits  TROPONIN I (HIGH SENSITIVITY) - Abnormal; Notable for the following components:   Troponin I (High Sensitivity) 20 (*)    All other components within normal limits  TROPONIN I (HIGH SENSITIVITY) - Abnormal; Notable for the following components:   Troponin I (High Sensitivity) 26 (*)    All other components within normal limits  RESP PANEL BY RT-PCR (FLU A&B, COVID) [id number]    EKG EKG Interpretation  Date/Time:  Tuesday Jun 02 2020 23:22:26 EDT Ventricular Rate:  63 PR Interval:  172 QRS Duration: 84 QT Interval:  454 QTC Calculation: 464 R Axis:   -59 Text Interpretation: Sinus rhythm with Premature atrial complexes  No significant changes from Aug 2019 ecg, no STEMI Confirmed by Octaviano Glow [phone]) on 06/03/2020 12:04:45 AM   Radiology DG Chest Portable 1 View  Result Date: 06/02/2020 CLINICAL DATA:  Shortness of breath for 2-3 days EXAM: PORTABLE CHEST 1 VIEW COMPARISON:  09/24/2019 FINDINGS: Cardiac shadow is enlarged but stable. Patchy airspace opacity is noted in the left lung base consistent with acute infiltrate. No sizable effusion is noted. No bony abnormality is seen. IMPRESSION: Patchy airspace opacity in the left base consistent with acute pneumonia. Electronically Signed   By: Inez Catalina  M.D.   On: 06/02/2020 23:36   DG Abdomen Acute W/Chest  Result Date: 06/03/2020 CLINICAL DATA:  70 year old female with constipation. EXAM: DG ABDOMEN ACUTE WITH 1 VIEW CHEST COMPARISON:  CT abdomen pelvis dated 05/08/2020. FINDINGS: There is mild diffuse interstitial coarsening. Left lung base linear atelectasis/scarring. No focal consolidation, pleural effusion, or pneumothorax. Mild cardiomegaly. Atherosclerotic calcification of the aorta. There is no bowel dilatation or evidence of obstruction. No significant colonic stool burden. No free air or radiopaque calculi. Osteopenia with degenerative changes of the spine. No acute osseous pathology. IMPRESSION: 1. No acute cardiopulmonary process. 2. No evidence of bowel obstruction. Electronically Signed   By: Anner Crete M.D.   On: 06/03/2020 01:41    Procedures Procedures   Medications Ordered in ED Medications  furosemide (LASIX) injection 60 mg (60 mg Intravenous Given 06/03/20 0047)    ED Course  I have reviewed the triage vital signs and the nursing notes.  Pertinent labs & imaging results that were available during my care of the patient were reviewed by me and considered in my medical decision making (see chart for details).  Clinical Course as of 06/03/20 0412  Wed Jun 03, 3820  [id number] 70 year old female with a history of diastolic congestive heart failure, recently upgraded from 40 to 21  mg of Lasix daily, present emergency department shortness of breath.  She reports worsening symmetrical lower extremity edema.  She reports dyspnea and orthopnea.  She denies chest pain or pressure.  She denies history of MI or PE.  On exam the patient is needing to sit upright in bed.  She appears tired.  She is speaking comfortably in full sentences.  She is satting 92 to 93% on room air while at rest.  She has fine crackles bilaterally on pulmonary exam.  She has symmetrical pitting edema of the lower extremities to the knee.  She does have a mild  cardiac murmur which she states is chronic.  Bedside echocardiogram shows B-lines in all bilateral lung fields.  This clinical presentation is suggestive of congestive heart failure exacerbation.  She will need IV diuresis.  There is also question about possible pneumonia on x-ray, although she has no leukocytosis, fever or cough.  This may be simply related to fluid in the lungs.  I reassess her clinically after diuresis.  She may require admission for repeat echocardiogram, as her last was in 2016, versus close outpatient follow-up.  She does not currently have a cardiologist. [MT]    Clinical Course User Index [MT] Maria Dusky, MD   MDM Rules/Calculators/A&P                          70 year old female history of diabetes mellitus type 2, CKD stage IV, GI bleed, HTN, hypothyroidism, lumbar radiculopathy, chronic back pain, constipation who presents the emergency department with shortness of breath that began yesterday.  She also endorses swelling to her legs, 10 pound weight gain, chills, and constipation.  Patient was recently discharged from the hospital last week.  She is hypertensive, but has not taken her nighttime dose of her antihypertensives.  Vital signs are otherwise unremarkable.  On evaluation, she appears volume overloaded.  Oxygen saturation is in the low 90s.  She was ambulated and oxygen saturation remained at 90 to 92%.  The patient has been seen and independently evaluated by Dr. Langston Masker, attending physician.  Labs have been reviewed and independently interpreted by me.  EKG with PACs.  Initial troponin is 20, repeat is 26.  Flat trend.  I am concerned that the patient is having an acute exacerbation of heart failure.  She has not established with cardiology and has not had an echo since 2015.  BNP is 669.  Bedside ultrasound performed by Dr. Langston Masker with B-lines in bilateral lung fields.  Chest x-ray suggestive of pulmonary vascular congestion on my read, but read as patchy  airspace opacity in the left lung base.  Could consider acute pneumonia, but less likely as she is having no cough or leukocytosis.  Hemoglobin is stable from previous.  She did note a small amount of bright red blood earlier this week, but had a bowel movement earlier today with no melena or hematochezia.  Creatinine is 3.47, significantly improved from previous at 4.5.  I did consider PE, but presentation is more atypical.  Also considered nephrotic syndrome given her history of chronic kidney disease.  However, with shortness of breath I am suspicious that she may be having an exacerbation of acute heart failure.  She would benefit from an echo and continue diuresis as she has been on 40 mg of oral Lasix at home that was increased to 80 mg over the last 2 days by her nephrologist.  Consult to the hospitalist team and  Dr. Cyd Silence will accept the patient for admission.  Of note, on reevaluation, patient was sleeping and appeared to have PND when I entered the room and was placed on 1.5 L [place]. The patient appears reasonably stabilized for admission considering the current resources, flow, and capabilities available in the ED at this time, and I doubt any other [facility] requiring further screening and/or treatment in the ED prior to admission.   Final Clinical Impression(s) / ED Diagnoses Final diagnoses:  Acute congestive heart failure, unspecified heart failure type [facility])    Rx / DC Orders ED Discharge Orders    None       Joanne Gavel, PA-C 06/03/20 0412    Maria Dusky, MD 06/03/20 1441

## 2020-06-03 NOTE — H&P (Addendum)
History and Physical    [name]:[id number] DOB: [birth year] DOA: 06/02/2020  PCP: Erby Pian, PA-C  Patient coming from: Home   Chief Complaint:   Shortness of Breath  HPI:    70 year old female with past medical history of hyperlipidemia, gastroesophageal reflux disease, diabetes mellitus type 2, chronic kidney disease stage IV, hypertension and recent gastrointestinal bleed (normal EGD 4/27 however) who was just discharged from [facility] on 4/29 presenting with shortness of breath.  Of note, patient was hospitalized at [facility] from 4/25 until 4/29.  Patient initially presented with fatigue and hemoglobin of 6.6.  Patient underwent 2 unit packed red blood cell transfusion.  Patient underwent EGD on 4/27 which did not identify any specific source of bleeding.  Patient also experienced acute kidney injury superimposed on chronic kidney disease stage IV.  Renal function stabilized, bleeding stopped and hemoglobin and hematocrit remained stable and therefore the patient was discharged on 4/29.  Patient explains that for several days since her discharge from the hospital she was feeling well but between 3 and 5 days ago patient began to experience bilateral lower extremity edema.  There is bilateral lower extremity edema progressively worsened over the next several days and was associated with increasing shortness of breath.  Shortness of breath was worse with exertion and improved with rest.  Shortness of breath became more more severe in intensity.  Patient additionally complaining of associated paroxysmal nocturnal dyspnea.  Patient denies pillow orthopnea.  Patient denies cough, fever, sick contacts, recent travel or contact with confirmed COVID-19 infection.  Patient does report an approximate 10 pound weight gain over the span of time.  Due to these worsening symptoms the patient eventually presented once again to [facility] emergency  department for evaluation.  Upon evaluation in the emergency department patient was felt to have signs and symptoms concerning for acute cardiogenic volume overload with peripheral edema, pulmonary rales and concerns for pulmonary edema on chest x-ray.  Patient was administered 60 mg of intravenous Lasix.  Patient was placed on supplemental oxygen due to bouts of hypoxia with minimal exertion.  The hospitalist group was then called to assess the patient for admission to the hospital.    Review of Systems:   Review of Systems  Respiratory: Positive for shortness of breath.   Cardiovascular: Positive for leg swelling and PND.    Past Medical History:  Diagnosis Date  . Acute kidney injury ([place]) 01/08/2015  . Arthritis   . Chronic back pain   . Constipation   . Cough   . Diabetes mellitus, type 2 ([place])   . Diverticulosis   . Fibroid    patient thinks this was the reason for her hysterectomy  . GERD (gastroesophageal reflux disease)   . Heart murmur   . History of sebaceous cyst   . Hyperlipemia   . Hyperplastic colon polyp   . Hypertension   . Hyperthyroidism   . Lumbar radiculopathy   . Shortness of breath 09/13/2013  . Stroke ([place]) 2015?   x4   . Tobacco abuse   . 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 ABDOMINAL HYSTERECTOMY     --?ovaries remain  . COLONOSCOPY W/ BIOPSIES  09/11/2008  . ESOPHAGOGASTRODUODENOSCOPY (EGD) WITH PROPOFOL N/A 05/20/2020   Procedure: ESOPHAGOGASTRODUODENOSCOPY (EGD) WITH PROPOFOL;  Surgeon: Irene Shipper, MD;  Location: [facility] ENDOSCOPY;  Service: Endoscopy;  Laterality: N/A;  . RETINOPATHY SURGERY Bilateral 2013   Dr. Zigmund Daniel     reports that she quit smoking about 5 years ago. Her smoking use included cigarettes. She has a 17.50 pack-year smoking history. She has never used smokeless tobacco. She reports that she does not drink alcohol and does not use  drugs.  Allergies  Allergen Reactions  . Semaglutide Rash    Rybelsus 3 mg    Family History  Problem Relation Age of Onset  . Hypertension Mother   . Sleep apnea Mother   . Diabetes Mother   . Hyperlipidemia Mother   . Lung cancer Father        smoker  . Hypertension Sister   . Diabetes Sister   . Hypertension Brother   . Hypertension Sister   . Diabetes Brother   . Hypertension Brother   . Breast cancer Neg Hx   . Colon cancer Neg Hx   . Esophageal cancer Neg Hx   . Pancreatic cancer Neg Hx   . Liver disease Neg Hx   . Colon polyps Neg Hx      Prior to Admission medications   Medication Sig Start Date End Date Taking? Authorizing Provider  acarbose (PRECOSE) 25 MG tablet Take 0.5 tablets (12.5 mg total) by mouth 3 (three) times daily with meals. 02/25/20  Yes Renato Shin, MD  allopurinol (ZYLOPRIM) 100 MG tablet Take 1 tablet (100 mg total) by mouth daily. 05/23/20  Yes Ghimire, Henreitta Leber, MD  amLODipine (NORVASC) 10 MG tablet TAKE 1 TABLET BY MOUTH EVERY DAY Patient taking differently: Take 10 mg by mouth daily. 03/16/20  Yes Marrian Salvage, FNP  aspirin 325 MG tablet Take 325 mg by mouth daily.   Yes [provider]  atorvastatin (LIPITOR) 80 MG tablet TAKE 1 TABLET BY MOUTH EVERY DAY Patient taking differently: Take 80 mg by mouth daily. 03/30/20  Yes Marrian Salvage, FNP  bromocriptine (PARLODEL) 2.5 MG tablet Take 0.5 tablets (1.25 mg total) by mouth daily. Patient taking differently: Take 2.5 mg by mouth daily. 02/24/20  Yes Renato Shin, MD  cloNIDine (CATAPRES) 0.2 MG tablet Take 1 tablet (0.2 mg total) by mouth 3 (three) times daily. 05/21/19  Yes Marrian Salvage, FNP  epoetin alfa-epbx (RETACRIT) 37048 UNIT/ML injection Inject 40,000 Units into the skin every 28 (twenty-eight) days.   Yes [provider]  feeding supplement (ENSURE ENLIVE / ENSURE PLUS) LIQD Take 237 mLs by mouth 2 (two) times daily between meals. Patient  taking differently: Take 237 mLs by mouth daily as needed (additional calories). 05/22/20 06/21/20 Yes Ghimire, Henreitta Leber, MD  furosemide (LASIX) 80 MG tablet Take 80 mg by mouth daily.   Yes [provider]  metoprolol succinate (TOPROL-XL) 100 MG 24 hr tablet Take 100 mg by mouth daily.   Yes [provider]  minoxidil (LONITEN) 10 MG tablet Take 10 mg by mouth 2 (two) times daily.   Yes [provider]  pantoprazole (PROTONIX) 40 MG tablet Take 1 tablet (40 mg total) by mouth daily. 06/12/19  Yes Marrian Salvage, FNP  polyethylene glycol (MIRALAX / GLYCOLAX) 17 g packet Take 34-51 g by mouth daily as needed for mild constipation.   Yes [provider]  repaglinide (PRANDIN) 2 MG tablet Take 1 tablet (2 mg total) by mouth 2 (two) times daily before a meal. 01/10/20  Yes Renato Shin, MD  Blood Glucose Monitoring Suppl ([place]) w/Device KIT 1 each by Does  not apply route 2 (two) times daily. E11.9    [provider]  glucose blood (ONETOUCH VERIO) test strip USE AS DIRECTED TWICE DAILY 12/12/19   Marrian Salvage, FNP  OneTouch Delica Lancets 08Q MISC 1 each by Does not apply route 2 (two) times daily. E11.9    [provider]    Physical Exam: Vitals:   06/03/20 0045 06/03/20 0334 06/03/20 0645 06/03/20 0700  BP: (!) 163/64 (!) 185/68 (!) 157/58 (!) 154/59  Pulse: 65 66  63  Resp: $Remo'18 18 15 16  'rDRyk$ Temp:      TempSrc:      SpO2: 96% 98%  100%    Constitutional: Awake alert and oriented x3, patient is in mild respiratory distress. Skin: Patient is notably diaphoretic.  No rashes, no lesions, good skin turgor noted. Eyes: Pupils are equally reactive to light.  No evidence of scleral icterus or conjunctival pallor.  ENMT: Moist mucous membranes noted.  Posterior pharynx clear of any exudate or lesions.   Neck: normal, supple, no masses, no thyromegaly.  No evidence of jugular venous distension.   Respiratory:  Notable rales in the bilateral mid and lower fields.  Intermittent mild expiratory wheezing noted.  Increased respiratory effort without evidence of accessory muscle use.   Cardiovascular: Regular rate and rhythm, 3/6 systolic murmur noted, extensive bilateral lower extremity pitting edema up through the thighs.. 2+ pedal pulses. No carotid bruits.  Chest:   Nontender without crepitus or deformity.   Back:   Nontender without crepitus or deformity. Abdomen: Abdomen is soft and nontender.  No evidence of intra-abdominal masses.  Positive bowel sounds noted in all quadrants.   Musculoskeletal: Bilateral lower extremity edema as noted above.  Good ROM, no contractures. Normal muscle tone.  Neurologic: CN 2-12 grossly intact. Sensation intact.  Patient moving all 4 extremities spontaneously.  Patient is following all commands.  Patient is responsive to verbal stimuli.   Psychiatric: Patient exhibits normal mood with flat affect.  Patient seems to possess insight as to their current situation.     Labs on Admission: I have personally reviewed following labs and imaging studies -   CBC: Recent Labs  Lab 05/29/20 1423 06/02/20 2321  WBC  --  8.3  NEUTROABS  --  5.7  HGB 9.6* 8.7*  HCT  --  27.1*  MCV  --  95.1  PLT  --  761   Basic Metabolic Panel: Recent Labs  Lab 06/02/20 2321  NA 135  K 3.8  CL 101  CO2 24  GLUCOSE 117*  BUN 72*  CREATININE 3.47*  CALCIUM 8.6*   GFR: CrCl cannot be calculated (Unknown ideal weight.). Liver Function Tests: Recent Labs  Lab 06/02/20 2321  AST 26  ALT 44  ALKPHOS 108  BILITOT 1.9*  PROT 6.8  ALBUMIN 3.2*   No results for input(s): LIPASE, AMYLASE in the last 168 hours. No results for input(s): AMMONIA in the last 168 hours. Coagulation Profile: No results for input(s): INR, PROTIME in the last 168 hours. Cardiac Enzymes: No results for input(s): CKTOTAL, CKMB, CKMBINDEX, TROPONINI in the last 168 hours. BNP (last 3 results) No  results for input(s): PROBNP in the last 8760 hours. HbA1C: No results for input(s): HGBA1C in the last 72 hours. CBG: No results for input(s): GLUCAP in the last 168 hours. Lipid Profile: No results for input(s): CHOL, HDL, LDLCALC, TRIG, CHOLHDL, LDLDIRECT in the last 72 hours. Thyroid Function Tests: No results for input(s): TSH, T4TOTAL, FREET4,  T3FREE, THYROIDAB in the last 72 hours. Anemia Panel: No results for input(s): VITAMINB12, FOLATE, FERRITIN, TIBC, IRON, RETICCTPCT in the last 72 hours. Urine analysis:    Component Value Date/Time   COLORURINE YELLOW 11/27/2019 2136   APPEARANCEUR HAZY (A) 11/27/2019 2136   LABSPEC 1.011 11/27/2019 2136   PHURINE 6.0 11/27/2019 2136   GLUCOSEU 150 (A) 11/27/2019 2136   GLUCOSEU NEGATIVE 09/04/2009 0721   HGBUR SMALL (A) 11/27/2019 2136   BILIRUBINUR NEGATIVE 11/27/2019 2136   BILIRUBINUR negative 09/24/2019 1003   KETONESUR NEGATIVE 11/27/2019 2136   PROTEINUR >=300 (A) 11/27/2019 2136   UROBILINOGEN 0.2 09/24/2019 1003   UROBILINOGEN 0.2 09/04/2009 0721   NITRITE NEGATIVE 11/27/2019 2136   LEUKOCYTESUR NEGATIVE 11/27/2019 2136    Radiological Exams on Admission - Personally Reviewed: DG Chest Portable 1 View  Result Date: 06/02/2020 CLINICAL DATA:  Shortness of breath for 2-3 days EXAM: PORTABLE CHEST 1 VIEW COMPARISON:  09/24/2019 FINDINGS: Cardiac shadow is enlarged but stable. Patchy airspace opacity is noted in the left lung base consistent with acute infiltrate. No sizable effusion is noted. No bony abnormality is seen. IMPRESSION: Patchy airspace opacity in the left base consistent with acute pneumonia. Electronically Signed   By: Inez Catalina M.D.   On: 06/02/2020 23:36      EKG: Personally reviewed.  Rhythm is normal sinus rhythm with heart rate of 63 bpm.  PACs.  No dynamic ST segment changes appreciated.  Assessment/Plan Principal Problem:   Shortness of breath   Patient presenting with several day history of  increasing dyspnea on exertion and paroxysmal nocturnal dyspnea  Exam reveals extensive bilateral lower extremity edema up to thighs with bilateral rales.  Chest x-ray does reveal some bibasilar findings of early pulmonary edema based on my review.  Suspect patient is suffering from either systolic or diastolic congestive heart failure.  Patient was recently placed on Lasix 80 mg by mouth daily by her outpatient nephrologist, prompting the ER provider to give the patient 60 mg of intravenous Lasix.  We will continue Lasix 60 mg IV twice daily for now  Strict input and output monitoring  Monitoring renal function and electrolytes with serial chemistries  Daily weights  Active Problems:   Hypertensive urgency   Patient presenting with markedly elevated blood pressures as high as 211/66 on arrival  Extremely elevated blood pressure may have contributed to patient's acute volume overload.  Patient has known history of difficult to manage blood pressure requiring multiple antihypertensives  We will resume home oral antihypertensive regimen  Monitoring patient on telemetry  Cardiac enzyme elevation reveals flat trajectory  As needed intravenous antihypertensives for markedly elevated blood pressure.  Elevated troponin not due to myocardial infarction   Patient is chest pain-free  Troponins are 20 and 26 suggestive of flat trajectory of elevation making plaque rupture unlikely  Monitoring patient on telemetry  Chronic kidney disease stage IV   Is actually substantially improved compared to previous hospitalization  Strict input and output monitoring  Monitoring renal function and electrolytes with serial chemistries  Attempting to minimize nephrotoxic agents as much as possible    Mixed diabetic hyperlipidemia associated with type 2 diabetes mellitus ([place])  . Continuing home regimen of lipid lowering therapy.    Anemia   Multifactorial anemia including anemia  of chronic disease and superimposed recent blood loss anemia  Hemoglobin currently stable compared to previous hospitalization  No clinical evidence of bleeding  Continue to monitor hemoglobin and hematocrit with serial CBCs.  Type 2 diabetes mellitus with stage 4 chronic kidney disease, without long-term current use of insulin ([place])  . Patient been placed on Accu-Cheks before every meal and nightly with sliding scale insulin . Holding home regimen of hypoglycemics . Hemoglobin A1C ordered . Diabetic Diet    GERD without esophagitis  . Continuing home regimen of daily PPI therapy.   Code Status:  Full code Family Communication: Deferred  Status is: Observation  The patient remains OBS appropriate and will d/c before 2 midnights.  Dispo: The patient is from: Home              Anticipated d/c is to: Home              Patient currently is not medically stable to d/c.   Difficult to place patient No        Vernelle Emerald MD [facility] Pager [phone]  If 7PM-7AM, please contact night-coverage [email or web address] Use universal [place] password for that web site. If you do not have the password, please call the hospital operator.  06/03/2020, 7:39 AM

## 2020-06-04 ENCOUNTER — Encounter (HOSPITAL_COMMUNITY): Payer: Self-pay | Admitting: Internal Medicine

## 2020-06-04 DIAGNOSIS — E1169 Type 2 diabetes mellitus with other specified complication: Secondary | ICD-10-CM | POA: Diagnosis present

## 2020-06-04 DIAGNOSIS — R011 Cardiac murmur, unspecified: Secondary | ICD-10-CM | POA: Diagnosis present

## 2020-06-04 DIAGNOSIS — R0602 Shortness of breath: Secondary | ICD-10-CM | POA: Diagnosis present

## 2020-06-04 DIAGNOSIS — I130 Hypertensive heart and chronic kidney disease with heart failure and stage 1 through stage 4 chronic kidney disease, or unspecified chronic kidney disease: Secondary | ICD-10-CM | POA: Diagnosis present

## 2020-06-04 DIAGNOSIS — Z20822 Contact with and (suspected) exposure to covid-19: Secondary | ICD-10-CM | POA: Diagnosis present

## 2020-06-04 DIAGNOSIS — I509 Heart failure, unspecified: Secondary | ICD-10-CM

## 2020-06-04 DIAGNOSIS — I160 Hypertensive urgency: Secondary | ICD-10-CM | POA: Diagnosis present

## 2020-06-04 DIAGNOSIS — E11319 Type 2 diabetes mellitus with unspecified diabetic retinopathy without macular edema: Secondary | ICD-10-CM | POA: Diagnosis present

## 2020-06-04 DIAGNOSIS — Z90710 Acquired absence of both cervix and uterus: Secondary | ICD-10-CM | POA: Diagnosis not present

## 2020-06-04 DIAGNOSIS — R0902 Hypoxemia: Secondary | ICD-10-CM | POA: Diagnosis present

## 2020-06-04 DIAGNOSIS — I5033 Acute on chronic diastolic (congestive) heart failure: Secondary | ICD-10-CM | POA: Insufficient documentation

## 2020-06-04 DIAGNOSIS — Z87891 Personal history of nicotine dependence: Secondary | ICD-10-CM | POA: Diagnosis not present

## 2020-06-04 DIAGNOSIS — E1165 Type 2 diabetes mellitus with hyperglycemia: Secondary | ICD-10-CM | POA: Diagnosis present

## 2020-06-04 DIAGNOSIS — E785 Hyperlipidemia, unspecified: Secondary | ICD-10-CM | POA: Diagnosis present

## 2020-06-04 DIAGNOSIS — E1122 Type 2 diabetes mellitus with diabetic chronic kidney disease: Secondary | ICD-10-CM | POA: Diagnosis present

## 2020-06-04 DIAGNOSIS — G8929 Other chronic pain: Secondary | ICD-10-CM | POA: Diagnosis present

## 2020-06-04 DIAGNOSIS — Z888 Allergy status to other drugs, medicaments and biological substances status: Secondary | ICD-10-CM | POA: Diagnosis not present

## 2020-06-04 DIAGNOSIS — Z8719 Personal history of other diseases of the digestive system: Secondary | ICD-10-CM | POA: Diagnosis not present

## 2020-06-04 DIAGNOSIS — E0590 Thyrotoxicosis, unspecified without thyrotoxic crisis or storm: Secondary | ICD-10-CM | POA: Diagnosis present

## 2020-06-04 DIAGNOSIS — K5900 Constipation, unspecified: Secondary | ICD-10-CM | POA: Diagnosis present

## 2020-06-04 DIAGNOSIS — D631 Anemia in chronic kidney disease: Secondary | ICD-10-CM | POA: Diagnosis present

## 2020-06-04 DIAGNOSIS — N184 Chronic kidney disease, stage 4 (severe): Secondary | ICD-10-CM | POA: Diagnosis present

## 2020-06-04 DIAGNOSIS — N179 Acute kidney failure, unspecified: Secondary | ICD-10-CM | POA: Diagnosis present

## 2020-06-04 DIAGNOSIS — R778 Other specified abnormalities of plasma proteins: Secondary | ICD-10-CM | POA: Diagnosis present

## 2020-06-04 DIAGNOSIS — K219 Gastro-esophageal reflux disease without esophagitis: Secondary | ICD-10-CM | POA: Diagnosis present

## 2020-06-04 DIAGNOSIS — Z8673 Personal history of transient ischemic attack (TIA), and cerebral infarction without residual deficits: Secondary | ICD-10-CM | POA: Diagnosis not present

## 2020-06-04 LAB — GLUCOSE, CAPILLARY
Glucose-Capillary: 150 mg/dL — ABNORMAL HIGH (ref 70–99)
Glucose-Capillary: 188 mg/dL — ABNORMAL HIGH (ref 70–99)
Glucose-Capillary: 158 mg/dL — ABNORMAL HIGH (ref 70–99)
Glucose-Capillary: 166 mg/dL — ABNORMAL HIGH (ref 70–99)

## 2020-06-04 LAB — BASIC METABOLIC PANEL
Anion gap: 10 (ref 5–15)
BUN: 76 mg/dL — ABNORMAL HIGH (ref 8–23)
CO2: 26 mmol/L (ref 22–32)
Calcium: 8.5 mg/dL — ABNORMAL LOW (ref 8.9–10.3)
Chloride: 102 mmol/L (ref 98–111)
Creatinine, Ser: 3.4 mg/dL — ABNORMAL HIGH (ref 0.44–1.00)
GFR, Estimated: 14 mL/min — ABNORMAL LOW (ref >60)
Glucose, Bld: 167 mg/dL — ABNORMAL HIGH (ref 70–99)
Potassium: 3.9 mmol/L (ref 3.5–5.1)
Sodium: 138 mmol/L (ref 135–145)

## 2020-06-04 NOTE — Progress Notes (Signed)
PROGRESS NOTE    [name]   DQQ:[id number]  DOB: Mar 29, 1950  DOA: 06/02/2020 PCP: Erby Pian, PA-C   Brief Narrative:  [name] 70 year old female with past medical history of hyperlipidemia, gastroesophageal reflux disease, diabetes mellitus type 2, chronic kidney disease stage IV, hypertension and recent gastrointestinal bleed (normal EGD 4/27 however) who was just discharged from [facility] on 4/29 presenting with shortness of breath. Diagnosed with acute CHF in the ED and admitted for diuresis.    Subjective: Still short of breath today but not as severe as yesterday.    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 Recommend PYP scan to evaluate for TTR amyloid and  SPEP/UPEP/light chains to evaluate for AL amyloid -cont IV lasix- increase to 80 IV TID due to CKD 4   - she still feels short of breath, has crackles at bases and pedal edema - appreciate cardiology eval- Dr Stanford Breed feels she needs at least 1 more day of IV diuresis and plans a work up of amyloidosis  Active Problems:   Hypertensive urgency - on multiple meds including amlodipine, Clonidine, Toprol, Minoxidil- added Hydralazine and Imdur- BP slightly low today- dc Clonidine   Type 2 diabetes mellitus  - dc Prandin and Acarbose - cont SSI   - follows with Dr Loanne Drilling     Anemia/ h/o GERD - recent GI bleed- Hb 9.6 > 8.1 today - ? If diluted due to being fluid overloaded - cont PPI  Note: Bromocriptine on med list- patient does not know why she is on this    Time spent in minutes: 35 DVT prophylaxis: enoxaparin (LOVENOX) injection 30 mg Start: 06/03/20 1400 Code Status: Full code Family Communication:  Level of Care: Level of care: Telemetry Medical Disposition Plan:  Status is:  Inpatient  Remains inpatient appropriate because:IV treatments appropriate due to intensity of illness or inability to take PO   Dispo: The patient is from: Home              Anticipated d/c is to: Home              Patient currently is not medically stable to d/c.   Difficult to place patient No      Consultants:   cardiology Procedures:    Antimicrobials:  Anti-infectives (From admission, onward)   None       Objective: Vitals:   06/04/20 0743 06/04/20 0830 06/04/20 1121 06/04/20 1400  BP: (!) 129/46  (!) 114/51 (!) 114/49  Pulse: (!) 59 61 (!) 58   Resp: 17  18   Temp: 98.1 F (36.7 C)  97.7 F (36.5 C)   TempSrc: Oral  Oral   SpO2: 97%  93%   Weight:      Height:        Intake/Output Summary (Last 24 hours) at 06/04/2020 1426 Last data filed at 06/04/2020 0930 Gross per 24 hour  Intake 333 ml  Output 900 ml  Net -567 ml   Filed Weights   06/03/20 1825 06/04/20 0353  Weight: 61.2 kg 61.1 kg    Examination: General exam: Appears comfortable  HEENT: PERRLA, oral mucosa moist, no sclera icterus or thrush Respiratory system: Crackles at bases. Respiratory effort normal. Cardiovascular system: S1 & S2  heard, RRR.   Gastrointestinal system: Abdomen soft, non-tender, nondistended. Normal bowel sounds. Central nervous system: Alert and oriented. No focal neurological deficits. Extremities: No cyanosis, clubbing or edema Skin: No rashes or ulcers Psychiatry:  Mood & affect appropriate.     Data Reviewed: I have personally reviewed following labs and imaging studies  CBC: Recent Labs  Lab 05/29/20 1423 06/02/20 2321 06/03/20 0821  WBC  --  8.3 6.9  NEUTROABS  --  5.7 4.5  HGB 9.6* 8.7* 8.1*  HCT  --  27.1* 24.8*  MCV  --  95.1 96.1  PLT  --  286 767   Basic Metabolic Panel: Recent Labs  Lab 06/02/20 2321 06/04/20 0356  NA 135 138  K 3.8 3.9  CL 101 102  CO2 24 26  GLUCOSE 117* 167*  BUN 72* 76*  CREATININE 3.47* 3.40*  CALCIUM 8.6*  8.5*   GFR: Estimated Creatinine Clearance: 13.2 mL/min (A) (by C-G formula based on SCr of 3.4 mg/dL (H)). Liver Function Tests: Recent Labs  Lab 06/02/20 2321  AST 26  ALT 44  ALKPHOS 108  BILITOT 1.9*  PROT 6.8  ALBUMIN 3.2*   No results for input(s): LIPASE, AMYLASE in the last 168 hours. No results for input(s): AMMONIA in the last 168 hours. Coagulation Profile: No results for input(s): INR, PROTIME in the last 168 hours. Cardiac Enzymes: No results for input(s): CKTOTAL, CKMB, CKMBINDEX, TROPONINI in the last 168 hours. BNP (last 3 results) No results for input(s): PROBNP in the last 8760 hours. HbA1C: Recent Labs    06/03/20 0821  HGBA1C 6.2*   CBG: Recent Labs  Lab 06/03/20 1254 06/03/20 1757 06/03/20 2135 06/04/20 0631 06/04/20 1117  GLUCAP 91 135* 115* 150* 188*   Lipid Profile: No results for input(s): CHOL, HDL, LDLCALC, TRIG, CHOLHDL, LDLDIRECT in the last 72 hours. Thyroid Function Tests: No results for input(s): TSH, T4TOTAL, FREET4, T3FREE, THYROIDAB in the last 72 hours. Anemia Panel: No results for input(s): VITAMINB12, FOLATE, FERRITIN, TIBC, IRON, RETICCTPCT in the last 72 hours. Urine analysis:    Component Value Date/Time   COLORURINE YELLOW 11/27/2019 2136   APPEARANCEUR HAZY (A) 11/27/2019 2136   LABSPEC 1.011 11/27/2019 2136   PHURINE 6.0 11/27/2019 2136   GLUCOSEU 150 (A) 11/27/2019 2136   GLUCOSEU NEGATIVE 09/04/2009 0721   HGBUR SMALL (A) 11/27/2019 2136   BILIRUBINUR NEGATIVE 11/27/2019 2136   BILIRUBINUR negative 09/24/2019 1003   KETONESUR NEGATIVE 11/27/2019 2136   PROTEINUR >=300 (A) 11/27/2019 2136   UROBILINOGEN 0.2 09/24/2019 1003   UROBILINOGEN 0.2 09/04/2009 0721   NITRITE NEGATIVE 11/27/2019 2136   LEUKOCYTESUR NEGATIVE 11/27/2019 2136   Sepsis Labs: @LABRCNTIP (procalcitonin:4,lacticidven:4) ) Recent Results (from the past 240 hour(s))  Resp Panel by RT-PCR (Flu A&B, Covid) Nasopharyngeal Swab     Status: None    Collection Time: 06/03/20 12:48 AM   Specimen: Nasopharyngeal Swab; Nasopharyngeal(NP) swabs in vial transport medium  Result Value Ref Range Status   SARS Coronavirus 2 by RT PCR NEGATIVE NEGATIVE Final    Comment: (NOTE) SARS-CoV-2 target nucleic acids are NOT DETECTED.  The SARS-CoV-2 RNA is generally detectable in upper respiratory specimens during the acute phase of infection. The lowest concentration of SARS-CoV-2 viral copies this assay can detect is 138 copies/mL. A negative result does not preclude SARS-Cov-2 infection and should not be used as the sole basis for treatment or other patient management decisions. A negative result may occur with  improper specimen collection/handling, submission of specimen other  than nasopharyngeal swab, presence of viral mutation(s) within the areas targeted by this assay, and inadequate number of viral copies(<138 copies/mL). A negative result must be combined with clinical observations, patient history, and epidemiological information. The expected result is Negative.  Fact Sheet for Patients:  EntrepreneurPulse.com.au  Fact Sheet for Healthcare Providers:  IncredibleEmployment.be  This test is no t yet approved or cleared by the [place] FDA and  has been authorized for detection and/or diagnosis of SARS-CoV-2 by FDA under an Emergency Use Authorization (EUA). This EUA will remain  in effect (meaning this test can be used) for the duration of the COVID-19 declaration under Section 564(b)(1) of the Act, 21 U.S.C.section 360bbb-3(b)(1), unless the authorization is terminated  or revoked sooner.       Influenza A by PCR NEGATIVE NEGATIVE Final   Influenza B by PCR NEGATIVE NEGATIVE Final    Comment: (NOTE) The Xpert Xpress SARS-CoV-2/FLU/RSV plus assay is intended as an aid in the diagnosis of influenza from Nasopharyngeal swab specimens and should not be used as a sole basis for treatment.  Nasal washings and aspirates are unacceptable for Xpert Xpress SARS-CoV-2/FLU/RSV testing.  Fact Sheet for Patients: EntrepreneurPulse.com.au  Fact Sheet for Healthcare Providers: IncredibleEmployment.be  This test is not yet approved or cleared by the [place] FDA and has been authorized for detection and/or diagnosis of SARS-CoV-2 by FDA under an Emergency Use Authorization (EUA). This EUA will remain in effect (meaning this test can be used) for the duration of the COVID-19 declaration under Section 564(b)(1) of the Act, 21 U.S.C. section 360bbb-3(b)(1), unless the authorization is terminated or revoked.  Performed at [facility], [place] [address] [place], [place] [address]          Radiology Studies: DG Chest Portable 1 View  Result Date: 06/02/2020 CLINICAL DATA:  Shortness of breath for 2-3 days EXAM: PORTABLE CHEST 1 VIEW COMPARISON:  09/24/2019 FINDINGS: Cardiac shadow is enlarged but stable. Patchy airspace opacity is noted in the left lung base consistent with acute infiltrate. No sizable effusion is noted. No bony abnormality is seen. IMPRESSION: Patchy airspace opacity in the left base consistent with acute pneumonia. Electronically Signed   By: Inez Catalina M.D.   On: 06/02/2020 23:36   DG Abdomen Acute W/Chest  Result Date: 06/03/2020 CLINICAL DATA:  70 year old female with constipation. EXAM: DG ABDOMEN ACUTE WITH 1 VIEW CHEST COMPARISON:  CT abdomen pelvis dated 05/08/2020. FINDINGS: There is mild diffuse interstitial coarsening. Left lung base linear atelectasis/scarring. No focal consolidation, pleural effusion, or pneumothorax. Mild cardiomegaly. Atherosclerotic calcification of the aorta. There is no bowel dilatation or evidence of obstruction. No significant colonic stool burden. No free air or radiopaque calculi. Osteopenia with degenerative changes of the spine. No acute osseous pathology. IMPRESSION: 1. No acute  cardiopulmonary process. 2. No evidence of bowel obstruction. Electronically Signed   By: Anner Crete M.D.   On: 06/03/2020 01:41   ECHOCARDIOGRAM COMPLETE  Result Date: 06/03/2020    ECHOCARDIOGRAM REPORT   Patient Name:   CELESTIAL BARNFIELD Date of Exam: 06/03/2020 Medical Rec #:  [id number]         Height:       62.0 in Accession #:    [id number]        Weight:       139.0 lb Date of Birth:  1950-12-12          BSA:          1.638 m Patient Age:  70 years          BP:           167/58 mmHg Patient Gender: F                 HR:           69 bpm. Exam Location:  Inpatient Procedure: 2D Echo, Cardiac Doppler and Color Doppler Indications:    CHF-Acute Systolic W96.04  History:        Patient has prior history of Echocardiogram examinations, most                 recent 01/09/2015.  Sonographer:    Merrie Roof Referring Phys: [id number] [place]  1. Left ventricular ejection fraction, by estimation, is 70 to 75%. The left ventricle has hyperdynamic function. The left ventricle has no regional wall motion abnormalities. There is severe left ventricular hypertrophy. Left ventricular diastolic parameters are consistent with Grade II diastolic dysfunction (pseudonormalization). Elevated left atrial pressure.  2. Right ventricular systolic function is normal. The right ventricular size is normal. Tricuspid regurgitation signal is inadequate for assessing PA pressure.  3. Left atrial size was severely dilated.  4. The mitral valve is normal in structure. Trivial mitral valve regurgitation.  5. The aortic valve is tricuspid. Aortic valve regurgitation is trivial. Mild aortic valve stenosis. Vmax 2.5 m/s, MG 15 mmHg, AVA 1.5 cm^2, DI 0.65  6. 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 Recommend PYP scan to evaluate for TTR amyloid and SPEP/UPEP/light chains to evaluate for AL amyloid FINDINGS  Left Ventricle: Left ventricular ejection fraction, by estimation, is 70 to  75%. The left ventricle has hyperdynamic function. The left ventricle has no regional wall motion abnormalities. The left ventricular internal cavity size was normal in size. There is severe left ventricular hypertrophy. Left ventricular diastolic parameters are consistent with Grade II diastolic dysfunction (pseudonormalization). Elevated left atrial pressure. Right Ventricle: The right ventricular size is normal. No increase in right ventricular wall thickness. Right ventricular systolic function is normal. Tricuspid regurgitation signal is inadequate for assessing PA pressure. Left Atrium: Left atrial size was severely dilated. Right Atrium: Right atrial size was normal in size. Pericardium: Trivial pericardial effusion is present. Mitral Valve: The mitral valve is normal in structure. Trivial mitral valve regurgitation. Tricuspid Valve: The tricuspid valve is normal in structure. Tricuspid valve regurgitation is trivial. Aortic Valve: The aortic valve is tricuspid. Aortic valve regurgitation is trivial. Mild aortic stenosis is present. Aortic valve mean gradient measures 13.5 mmHg. Aortic valve peak gradient measures 24.6 mmHg. Aortic valve area, by VTI measures 1.57 cm. Pulmonic Valve: The pulmonic valve was not well visualized. Pulmonic valve regurgitation is not visualized. Aorta: The aortic root and ascending aorta are structurally normal, with no evidence of dilitation. IAS/Shunts: The interatrial septum was not well visualized.  LEFT VENTRICLE PLAX 2D LVIDd:         3.70 cm  Diastology LVIDs:         2.20 cm  LV e' medial:    6.42 cm/s LV PW:         1.80 cm  LV E/e' medial:  19.9 LV IVS:        1.60 cm  LV e' lateral:   7.62 cm/s LVOT diam:     1.70 cm  LV E/e' lateral: 16.8 LV SV:         93 LV SV Index:   57 LVOT  Area:     2.27 cm  IVC IVC diam: 2.00 cm LEFT ATRIUM             Index LA diam:        5.10 cm 3.11 cm/m LA Vol (A2C):   68.1 ml 41.58 ml/m LA Vol (A4C):   79.3 ml 48.42 ml/m LA Biplane  Vol: 81.7 ml 49.88 ml/m  AORTIC VALVE AV Area (Vmax):    1.60 cm AV Area (Vmean):   1.72 cm AV Area (VTI):     1.57 cm AV Vmax:           248.00 cm/s AV Vmean:          173.000 cm/s AV VTI:            0.594 m AV Peak Grad:      24.6 mmHg AV Mean Grad:      13.5 mmHg LVOT Vmax:         175.00 cm/s LVOT Vmean:        131.000 cm/s LVOT VTI:          0.410 m LVOT/AV VTI ratio: 0.69  AORTA Ao Root diam: 3.20 cm Ao Asc diam:  3.30 cm MITRAL VALVE MV Area (PHT): 2.62 cm     SHUNTS MV Decel Time: 289 msec     Systemic VTI:  0.41 m MV E velocity: 128.00 cm/s  Systemic Diam: 1.70 cm MV A velocity: 157.00 cm/s MV E/A ratio:  0.82 Oswaldo Milian MD Electronically signed by Oswaldo Milian MD Signature Date/Time: 06/03/2020/5:20:13 PM    Final       Scheduled Meds: . allopurinol  100 mg Oral Daily  . amLODipine  10 mg Oral Daily  . atorvastatin  80 mg Oral Daily  . bromocriptine  2.5 mg Oral Daily  . enoxaparin (LOVENOX) injection  30 mg Subcutaneous Daily  . furosemide  80 mg Intravenous TID  . hydrALAZINE  50 mg Oral Q8H  . insulin aspart  0-9 Units Subcutaneous TID AC & HS  . isosorbide mononitrate  30 mg Oral Daily  . metoprolol succinate  100 mg Oral Daily  . minoxidil  10 mg Oral BID  . pantoprazole  40 mg Oral Daily  . polyethylene glycol  17 g Oral Daily  . sodium chloride flush  3 mL Intravenous Q12H   Continuous Infusions: . sodium chloride       LOS: 0 days      Debbe Odea, MD [facility] Pager: [email or web address] 06/04/2020, 2:26 PM

## 2020-06-04 NOTE — Consult Note (Signed)
Cardiology Consultation:   Patient ID: [name] MRN: [id number]; DOB: 1951-01-08  Admit date: 06/02/2020 Date of Consult: 06/04/2020  PCP:  Erby Pian, PA-C   [place] Providers Cardiologist:  Dr Debara Pickett    Patient Profile:   [name] is a 70 y.o. female with a hx of hypertension, hyperlipidemia, prior CVA, diabetes mellitus, chronic stage IV kidney disease who is being seen 06/04/2020 for the evaluation of CHF at the request of Debbe Odea MD.  History of Present Illness:   Renal Dopplers in 2017 showed no renal artery stenosis.  Patient recently discharged following admission for possible GI bleed.  She was transfused and underwent EGD which showed no significant bleeding foci.  She was also noted to have acute on chronic stage IV kidney disease.  She was readmitted on May 11 with complaints of dyspnea, bilateral lower extremity edema and weight gain of 10 lbs. Her symptoms were predominantly dyspnea on exertion, orthopnea and increasing lower extremity edema.  No fevers, chills or productive cough.  No hemoptysis.  She has been treated with IV Lasix and cardiology now asked to evaluate.  Note she denies chest pain.   Past Medical History:  Diagnosis Date  . Acute kidney injury ([place]) 01/08/2015  . Arthritis   . Chronic back pain   . Constipation   . Cough   . Diabetes mellitus, type 2 ([place])   . Diverticulosis   . Fibroid    patient thinks this was the reason for her hysterectomy  . GERD (gastroesophageal reflux disease)   . Heart murmur   . History of sebaceous cyst   . Hyperlipemia   . Hyperplastic colon polyp   . Hypertension   . Hyperthyroidism   . Lumbar radiculopathy   . Shortness of breath 09/13/2013  . Stroke ([place]) 2015?   x4   . Tobacco abuse   . 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 ABDOMINAL HYSTERECTOMY     --?ovaries remain  .  COLONOSCOPY W/ BIOPSIES  09/11/2008  . ESOPHAGOGASTRODUODENOSCOPY (EGD) WITH PROPOFOL N/A 05/20/2020   Procedure: ESOPHAGOGASTRODUODENOSCOPY (EGD) WITH PROPOFOL;  Surgeon: Irene Shipper, MD;  Location[facility] END[facility]COPY;  Service: Endoscopy;  Laterality: N/A;  . RETINOPATHY SURGERY Bilateral 2013   Dr. Zigmund Daniel      Inpatient Medications: Scheduled Meds: . allopurinol  100 mg Oral Daily  . amLODipine  10 mg Oral Daily  . atorvastatin  80 mg Oral Daily  . bromocriptine  2.5 mg Oral Daily  . cloNIDine  0.2 mg Oral Q8H  . enoxaparin (LOVENOX) injection  30 mg Subcutaneous Daily  . furosemide  80 mg Intravenous TID  . hydrALAZINE  50 mg Oral Q8H  . insulin aspart  0-9 Units Subcutaneous TID AC & HS  . isosorbide mononitrate  30 mg Oral Daily  . metoprolol succinate  100 mg Oral Daily  . minoxidil  10 mg Oral BID  . pantoprazole  40 mg Oral Daily  . polyethylene glycol  17 g Oral Daily  . sodium chloride flush  3 mL Intravenous Q12H   Continuous Infusions: . sodium chloride     PRN Meds: sodium chloride, acetaminophen, ondansetron (ZOFRAN) IV, sodium chloride flush  Allergies:    Allergies  Allergen Reactions  . Semaglutide Rash    Rybelsus 3 mg    Social History:   Social History   Socioeconomic History  .  Marital status: Divorced    Spouse name: Not on file  . Number of children: 1  . Years of education: 11  . Highest education level: Not on file  Occupational History  . 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 Smoking status: Former Smoker    Packs/day: 0.50    Years: 35.00    Pack years: 17.50    Types: Cigarettes    Quit date: 07/07/2014    Years since quitting: 5.9  . Smokeless tobacco: Never Used  Vaping Use  . Vaping Use: Never used  Substance and Sexual Activity  . Alcohol use: No  . Drug use: No  . Sexual activity: Never    Partners: Male    Birth control/protection: Surgical    Comment:  TAH--?ovaries remain  Other Topics Concern  . Not on file  Social History Narrative   Divorced 1 son    Retired Museum/gallery curator for [employer]   Former smoker no alcohol caffeine or drug use report denies abuse and feels safe at home.   Social Determinants of Health   Financial Resource Strain: Medium Risk  . Difficulty of Paying Living Expenses: Somewhat hard  Food Insecurity: Food Insecurity Present  . Worried About Charity fundraiser in the Last Year: Often true  . Ran Out of Food in the Last Year: Sometimes true  Transportation Needs: No Transportation Needs  . Lack of Transportation (Medical): No  . Lack of Transportation (Non-Medical): No  Physical Activity: Sufficiently Active  . Days of Exercise per Week: 5 days  . Minutes of Exercise per Session: 30 min  Stress: No Stress Concern Present  . Feeling of Stress : Not at all  Social Connections: Not on file  Intimate Partner Violence: Not At Risk  . Fear of Current or Ex-Partner: No  . Emotionally Abused: No  . Physically Abused: No  . Sexually Abused: No    Family History:    Family History  Problem Relation Age of Onset  . Hypertension Mother   . Sleep apnea Mother   . Diabetes Mother   . Hyperlipidemia Mother   . Lung cancer Father        smoker  . Hypertension Sister   . Diabetes Sister   . Hypertension Brother   . Hypertension Sister   . Diabetes Brother   . Hypertension Brother   . Breast cancer Neg Hx   . Colon cancer Neg Hx   . Esophageal cancer Neg Hx   . Pancreatic cancer Neg Hx   . Liver disease Neg Hx   . Colon polyps Neg Hx      ROS:  Please see the history of present illness.  No fevers, chills or productive cough. All other ROS reviewed and negative.     Physical Exam/Data:   Vitals:   06/04/20 0536 06/04/20 0743 06/04/20 0830 06/04/20 1121  BP: (!) 133/51 (!) 129/46  (!) 114/51  Pulse: (!) 59 (!) 59 61 (!) 58  Resp:  17  18  Temp:  98.1 F (36.7 C)  97.7 F (36.5 C)   TempSrc:  Oral  Oral  SpO2:  97%  93%  Weight:      Height:        Intake/Output Summary (Last 24 hours) at 06/04/2020 1348 Last data filed at 06/04/2020 0930 Gross per 24 hour  Intake 333 ml  Output 900 ml  Net -567 ml  Last 3 Weights 06/04/2020 06/03/2020 05/19/2020  Weight (lbs) 134 lb 12.8 oz 134 lb 14.7 oz 139 lb  Weight (kg) 61.145 kg 61.2 kg 63.05 kg     Body mass index is 24.66 kg/m.  General:  Well nourished, well developed, in no acute distress HEENT: normal Lymph: no adenopathy Neck: supple Endocrine:  No thryomegaly Vascular: No carotid bruits; FA pulses 2+ bilaterally without bruits  Cardiac:  normal S1, S2; RRR; 3/6 systolic murmur Lungs:  clear to auscultation bilaterally, no wheezing, rhonchi or rales  Abd: soft, nontender, no hepatomegaly  Ext: 1+ edema Musculoskeletal:  No deformities, BUE and BLE strength normal and equal Skin: warm and dry  Neuro:  CNs 2-12 intact, no focal abnormalities noted Psych:  Normal affect   EKG:  The EKG was personally reviewed and demonstrates: Sinus rhythm, left anterior fascicular block, left atrial enlargement, cannot rule out septal infarct. Telemetry:  Telemetry was personally reviewed and demonstrates: Sinus with rare PAC  Laboratory Data:  High Sensitivity Troponin:   Recent Labs  Lab 06/03/20 0044 06/03/20 0230  TROPONINIHS 20* 26*     Chemistry Recent Labs  Lab 06/02/20 2321 06/04/20 0356  NA 135 138  K 3.8 3.9  CL 101 102  CO2 24 26  GLUCOSE 117* 167*  BUN 72* 76*  CREATININE 3.47* 3.40*  CALCIUM 8.6* 8.5*  GFRNONAA 14* 14*  ANIONGAP 10 10    Recent Labs  Lab 06/02/20 2321  PROT 6.8  ALBUMIN 3.2*  AST 26  ALT 44  ALKPHOS 108  BILITOT 1.9*   Hematology Recent Labs  Lab 05/29/20 1423 06/02/20 2321 06/03/20 0821  WBC  --  8.3 6.9  RBC  --  2.85* 2.58*  HGB 9.6* 8.7* 8.1*  HCT  --  27.1* 24.8*  MCV  --  95.1 96.1  MCH  --  30.5 31.4  MCHC  --  32.1 32.7  RDW  --  15.2 15.6*  PLT   --  286 274   BNP Recent Labs  Lab 06/03/20 0044  BNP 669.8*    Radiology/Studies:  DG Chest Portable 1 View  Result Date: 06/02/2020 CLINICAL DATA:  Shortness of breath for 2-3 days EXAM: PORTABLE CHEST 1 VIEW COMPARISON:  09/24/2019 FINDINGS: Cardiac shadow is enlarged but stable. Patchy airspace opacity is noted in the left lung base consistent with acute infiltrate. No sizable effusion is noted. No bony abnormality is seen. IMPRESSION: Patchy airspace opacity in the left base consistent with acute pneumonia. Electronically Signed   By: Inez Catalina M.D.   On: 06/02/2020 23:36   DG Abdomen Acute W/Chest  Result Date: 06/03/2020 CLINICAL DATA:  70 year old female with constipation. EXAM: DG ABDOMEN ACUTE WITH 1 VIEW CHEST COMPARISON:  CT abdomen pelvis dated 05/08/2020. FINDINGS: There is mild diffuse interstitial coarsening. Left lung base linear atelectasis/scarring. No focal consolidation, pleural effusion, or pneumothorax. Mild cardiomegaly. Atherosclerotic calcification of the aorta. There is no bowel dilatation or evidence of obstruction. No significant colonic stool burden. No free air or radiopaque calculi. Osteopenia with degenerative changes of the spine. No acute osseous pathology. IMPRESSION: 1. No acute cardiopulmonary process. 2. No evidence of bowel obstruction. Electronically Signed   By: Anner Crete M.D.   On: 06/03/2020 01:41   ECHOCARDIOGRAM COMPLETE  Result Date: 06/03/2020    ECHOCARDIOGRAM REPORT   Patient Name:   [name] Date of Exam: 06/03/2020 Medical Rec #:  [id number]         Height:  62.0 in Accession #:    [id number]        Weight:       139.0 lb Date of Birth:  Oct 25, 1950          BSA:          1.638 m Patient Age:    88 years          BP:           167/58 mmHg Patient Gender: F                 HR:           69 bpm. Exam Location:  Inpatient Procedure: 2D Echo, Cardiac Doppler and Color Doppler Indications:    CHF-Acute Systolic O27.03  History:         Patient has prior history of Echocardiogram examinations, most                 recent 01/09/2015.  Sonographer:    Merrie Roof Referring Phys: [id number] [place]  1. Left ventricular ejection fraction, by estimation, is 70 to 75%. The left ventricle has hyperdynamic function. The left ventricle has no regional wall motion abnormalities. There is severe left ventricular hypertrophy. Left ventricular diastolic parameters are consistent with Grade II diastolic dysfunction (pseudonormalization). Elevated left atrial pressure.  2. Right ventricular systolic function is normal. The right ventricular size is normal. Tricuspid regurgitation signal is inadequate for assessing PA pressure.  3. Left atrial size was severely dilated.  4. The mitral valve is normal in structure. Trivial mitral valve regurgitation.  5. The aortic valve is tricuspid. Aortic valve regurgitation is trivial. Mild aortic valve stenosis. Vmax 2.5 m/s, MG 15 mmHg, AVA 1.5 cm^2, DI 0.65  6. 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 Recommend PYP scan to evaluate for TTR amyloid and SPEP/UPEP/light chains to evaluate for AL amyloid FINDINGS  Left Ventricle: Left ventricular ejection fraction, by estimation, is 70 to 75%. The left ventricle has hyperdynamic function. The left ventricle has no regional wall motion abnormalities. The left ventricular internal cavity size was normal in size. There is severe left ventricular hypertrophy. Left ventricular diastolic parameters are consistent with Grade II diastolic dysfunction (pseudonormalization). Elevated left atrial pressure. Right Ventricle: The right ventricular size is normal. No increase in right ventricular wall thickness. Right ventricular systolic function is normal. Tricuspid regurgitation signal is inadequate for assessing PA pressure. Left Atrium: Left atrial size was severely dilated. Right Atrium: Right atrial size was normal in size.  Pericardium: Trivial pericardial effusion is present. Mitral Valve: The mitral valve is normal in structure. Trivial mitral valve regurgitation. Tricuspid Valve: The tricuspid valve is normal in structure. Tricuspid valve regurgitation is trivial. Aortic Valve: The aortic valve is tricuspid. Aortic valve regurgitation is trivial. Mild aortic stenosis is present. Aortic valve mean gradient measures 13.5 mmHg. Aortic valve peak gradient measures 24.6 mmHg. Aortic valve area, by VTI measures 1.57 cm. Pulmonic Valve: The pulmonic valve was not well visualized. Pulmonic valve regurgitation is not visualized. Aorta: The aortic root and ascending aorta are structurally normal, with no evidence of dilitation. IAS/Shunts: The interatrial septum was not well visualized.  LEFT VENTRICLE PLAX 2D LVIDd:         3.70 cm  Diastology LVIDs:         2.20 cm  LV e' medial:    6.42 cm/s LV PW:         1.80 cm  LV E/e' medial:  19.9 LV IVS:        1.60 cm  LV e' lateral:   7.62 cm/s LVOT diam:     1.70 cm  LV E/e' lateral: 16.8 LV SV:         93 LV SV Index:   57 LVOT Area:     2.27 cm  IVC IVC diam: 2.00 cm LEFT ATRIUM             Index LA diam:        5.10 cm 3.11 cm/m LA Vol (A2C):   68.1 ml 41.58 ml/m LA Vol (A4C):   79.3 ml 48.42 ml/m LA Biplane Vol: 81.7 ml 49.88 ml/m  AORTIC VALVE AV Area (Vmax):    1.60 cm AV Area (Vmean):   1.72 cm AV Area (VTI):     1.57 cm AV Vmax:           248.00 cm/s AV Vmean:          173.000 cm/s AV VTI:            0.594 m AV Peak Grad:      24.6 mmHg AV Mean Grad:      13.5 mmHg LVOT Vmax:         175.00 cm/s LVOT Vmean:        131.000 cm/s LVOT VTI:          0.410 m LVOT/AV VTI ratio: 0.69  AORTA Ao Root diam: 3.20 cm Ao Asc diam:  3.30 cm MITRAL VALVE MV Area (PHT): 2.62 cm     SHUNTS MV Decel Time: 289 msec     Systemic VTI:  0.41 m MV E velocity: 128.00 cm/s  Systemic Diam: 1.70 cm MV A velocity: 157.00 cm/s MV E/A ratio:  0.82 Oswaldo Milian MD Electronically signed by Oswaldo Milian MD Signature Date/Time: 06/03/2020/5:20:13 PM    Final      Assessment and Plan:   1. Acute on chronic diastolic congestive heart failure-patient has improved since admission.  However she states she is not back to baseline and is mildly volume overloaded on examination.  We will continue Lasix at present dose for now.  Can likely transition to oral diuretic tomorrow.  Demadex may be a better choice long-term. 2. Left ventricular hypertrophy-likely secondary to longstanding hypertension.  However we will arrange SPEP, UPEP, serum light chains and PYP scan as an outpatient. 3. Chronic stage IV kidney disease-follow renal function closely with diuresis.  Will need close follow-up with nephrology after discharge. 4. Hypertension-blood pressure has improved since admission.  Continue present medications and follow. 5. Anemia-at least partially secondary to renal insufficiency.  Recent endoscopy unrevealing.  She apparently is scheduled for colonoscopy later this month. 6. Minimally elevated troponin-patient denies chest pain and no clear trend.  Not consistent with acute coronary syndrome.  No plans for further ischemia evaluation.[id number]}      New York Heart Association (NYHA) Functional Class NYHA Class III        For questions or updates, please contact [place] Please consult [email or web address] for contact info under    Signed, Kirk Ruths, MD  06/04/2020 1:48 PM

## 2020-06-04 NOTE — Progress Notes (Signed)
Nutrition Brief Note  Patient identified on the Malnutrition Screening Tool (MST) Report  Wt Readings from Last 15 Encounters:  06/04/20 61.1 kg  05/19/20 63 kg  05/08/20 61.2 kg  04/23/20 61.7 kg  02/24/20 61.9 kg  01/10/20 64 kg  12/23/19 66.7 kg  12/16/19 68.8 kg  12/03/19 56.6 kg  11/25/19 62.6 kg  11/11/19 62.1 kg  09/24/19 62.6 kg  09/09/19 65.9 kg  02/04/19 64.6 kg  01/21/19 69.27 kg   70 year old female with past medical history of hyperlipidemia, gastroesophageal reflux disease, diabetes mellitus type 2, chronic kidney disease stage IV, hypertension and recent gastrointestinal bleed (normal EGD 4/27 however) who was just discharged from [facility] [place] on 4/29 presenting with shortness of breath.  Pt admitted with shortness of breath.   Reviewed I/O's: -1.6 L x 24 hours and -2.7 L since admission  UOP: 1.8 L x 24 hours  Pt unavailable at time of visit. Attempted to speak with pt via call to hospital room phone, however, unable to reach. RD unable to obtain further nutrition-related history or complete nutrition-focused physical exam at this time.   Po 100%  Reviewed wt hx; pt has experineced a 2.4% wt loss over the past 6 months, which is not significant for time frame.  Medications reviewed and include lasix and miralax.   Lab Results  Component Value Date   HGBA1C 6.2 (H) 06/03/2020  PTA DM medications are none.   Labs reviewed: CBGS: 115-188 (inpatient orders for glycemic control are 0-9 units inuslin aspart TID with meals and at bedtime).   Current diet order is heart healthy/ carb modified, patient is consuming approximately 100% of meals at this time. Labs and medications reviewed.   No nutrition interventions warranted at this time. If nutrition issues arise, please consult RD.   Loistine Chance, RD, LDN, [place] Registered Dietitian II Certified Diabetes Care and Education Specialist Please refer to [facility] for RD and/or RD on-call/weekend/after hours  pager

## 2020-06-04 NOTE — Plan of Care (Signed)
  Problem: Education: Goal: Knowledge of General Education information will improve Description: Including pain rating scale, medication(s)/side effects and non-pharmacologic comfort measures Outcome: Progressing   Problem: Clinical Measurements: Goal: Ability to maintain clinical measurements within normal limits will improve Outcome: Progressing Goal: Will remain free from infection Outcome: Progressing Goal: Diagnostic test results will improve Outcome: Progressing Goal: Respiratory complications will improve Outcome: Progressing Goal: Cardiovascular complication will be avoided Outcome: Progressing   Problem: Health Behavior/Discharge Planning: Goal: Ability to manage health-related needs will improve Outcome: Progressing   Problem: Activity: Goal: Risk for activity intolerance will decrease Outcome: Progressing

## 2020-06-05 ENCOUNTER — Inpatient Hospital Stay (HOSPITAL_COMMUNITY): Payer: Medicare Other

## 2020-06-05 DIAGNOSIS — I5033 Acute on chronic diastolic (congestive) heart failure: Secondary | ICD-10-CM | POA: Diagnosis not present

## 2020-06-05 DIAGNOSIS — R0602 Shortness of breath: Secondary | ICD-10-CM | POA: Diagnosis not present

## 2020-06-05 LAB — CBC
HCT: 23.7 % — ABNORMAL LOW (ref 36.0–46.0)
Hemoglobin: 7.9 g/dL — ABNORMAL LOW (ref 12.0–15.0)
MCH: 32.2 pg (ref 26.0–34.0)
MCHC: 33.3 g/dL (ref 30.0–36.0)
MCV: 96.7 fL (ref 80.0–100.0)
Platelets: 278 10*3/uL (ref 150–400)
RBC: 2.45 MIL/uL — ABNORMAL LOW (ref 3.87–5.11)
RDW: 16.1 % — ABNORMAL HIGH (ref 11.5–15.5)
WBC: 6.4 10*3/uL (ref 4.0–10.5)
nRBC: 0.3 % — ABNORMAL HIGH (ref 0.0–0.2)

## 2020-06-05 LAB — BASIC METABOLIC PANEL
Anion gap: 10 (ref 5–15)
BUN: 76 mg/dL — ABNORMAL HIGH (ref 8–23)
CO2: 26 mmol/L (ref 22–32)
Calcium: 8.5 mg/dL — ABNORMAL LOW (ref 8.9–10.3)
Chloride: 103 mmol/L (ref 98–111)
Creatinine, Ser: 4.06 mg/dL — ABNORMAL HIGH (ref 0.44–1.00)
GFR, Estimated: 11 mL/min — ABNORMAL LOW (ref >60)
Glucose, Bld: 95 mg/dL (ref 70–99)
Potassium: 4 mmol/L (ref 3.5–5.1)
Sodium: 139 mmol/L (ref 135–145)

## 2020-06-05 LAB — BRAIN NATRIURETIC PEPTIDE: B Natriuretic Peptide: 643.2 pg/mL — ABNORMAL HIGH (ref 0.0–100.0)

## 2020-06-05 LAB — GLUCOSE, CAPILLARY
Glucose-Capillary: 104 mg/dL — ABNORMAL HIGH (ref 70–99)
Glucose-Capillary: 154 mg/dL — ABNORMAL HIGH (ref 70–99)
Glucose-Capillary: 174 mg/dL — ABNORMAL HIGH (ref 70–99)
Glucose-Capillary: 158 mg/dL — ABNORMAL HIGH (ref 70–99)

## 2020-06-05 MED ORDER — FUROSEMIDE 10 MG/ML IJ SOLN
80.0000 mg | Freq: Every day | INTRAMUSCULAR | Status: DC
  Administered 2020-06-05 – 2020-06-06 (×2): 80 mg via INTRAVENOUS
  Filled 2020-06-05 (×2): qty 8

## 2020-06-05 NOTE — Progress Notes (Signed)
Pt c/o sob spo2 90s placed on O2 2LNC with relief.

## 2020-06-05 NOTE — Progress Notes (Signed)
Progress Note  Patient Name: [name] Date of Encounter: 06/05/2020  [facility] Cardiologist: Dr Debara Pickett  Subjective   No CP; complains of dyspnea.  Inpatient Medications    Scheduled Meds: . allopurinol  100 mg Oral Daily  . amLODipine  10 mg Oral Daily  . atorvastatin  80 mg Oral Daily  . bromocriptine  2.5 mg Oral Daily  . enoxaparin (LOVENOX) injection  30 mg Subcutaneous Daily  . hydrALAZINE  50 mg Oral Q8H  . insulin aspart  0-9 Units Subcutaneous TID AC & HS  . isosorbide mononitrate  30 mg Oral Daily  . metoprolol succinate  100 mg Oral Daily  . minoxidil  10 mg Oral BID  . pantoprazole  40 mg Oral Daily  . polyethylene glycol  17 g Oral Daily  . sodium chloride flush  3 mL Intravenous Q12H   Continuous Infusions: . sodium chloride     PRN Meds: sodium chloride, acetaminophen, ondansetron (ZOFRAN) IV, sodium chloride flush   Vital Signs    Vitals:   06/05/20 0000 06/05/20 0620 06/05/20 0806 06/05/20 0807  BP:  (!) 124/37  (!) 135/46  Pulse:  73  72  Resp:  20    Temp:  99.6 F (37.6 C)  98.6 F (37 C)  TempSrc:  Oral  Oral  SpO2:  91% 95%   Weight: 59.6 kg     Height:        Intake/Output Summary (Last 24 hours) at 06/05/2020 0911 Last data filed at 06/05/2020 0600 Gross per 24 hour  Intake 760 ml  Output 1200 ml  Net -440 ml   Last 3 Weights 06/05/2020 06/04/2020 06/03/2020  Weight (lbs) 131 lb 8 oz 134 lb 12.8 oz 134 lb 14.7 oz  Weight (kg) 59.648 kg 61.145 kg 61.2 kg      Telemetry    Sinus - Personally Reviewed   Physical Exam   GEN: No acute distress.   Neck: supple Cardiac: RRR, 2/6 systolic murmur Respiratory: Mildly diminished BS bases GI: Soft, nontender, non-distended  MS: No edema Neuro:  Nonfocal  Psych: Normal affect   Labs    High Sensitivity Troponin:   Recent Labs  Lab 06/03/20 0044 06/03/20 0230  TROPONINIHS 20* 26*      Chemistry Recent Labs  Lab 06/02/20 2321 06/04/20 0356 06/05/20 0342   NA 135 138 139  K 3.8 3.9 4.0  CL 101 102 103  CO2 24 26 26   GLUCOSE 117* 167* 95  BUN 72* 76* 76*  CREATININE 3.47* 3.40* 4.06*  CALCIUM 8.6* 8.5* 8.5*  PROT 6.8  --   --   ALBUMIN 3.2*  --   --   AST 26  --   --   ALT 44  --   --   ALKPHOS 108  --   --   BILITOT 1.9*  --   --   GFRNONAA 14* 14* 11*  ANIONGAP 10 10 10      Hematology Recent Labs  Lab 06/02/20 2321 06/03/20 0821 06/05/20 0738  WBC 8.3 6.9 6.4  RBC 2.85* 2.58* 2.45*  HGB 8.7* 8.1* 7.9*  HCT 27.1* 24.8* 23.7*  MCV 95.1 96.1 96.7  MCH 30.5 31.4 32.2  MCHC 32.1 32.7 33.3  RDW 15.2 15.6* 16.1*  PLT 286 274 278    BNP Recent Labs  Lab 06/03/20 0044  BNP 669.8*      Radiology    ECHOCARDIOGRAM COMPLETE  Result Date: 06/03/2020    ECHOCARDIOGRAM  REPORT   Patient Name:   [name] Date of Exam: 06/03/2020 Medical Rec #:  [id number]         Height:       62.0 in Accession #:    [id number]        Weight:       139.0 lb Date of Birth:  05/30/1950          BSA:          1.638 m Patient Age:    70 years          BP:           167/58 mmHg Patient Gender: F                 HR:           69 bpm. Exam Location:  Inpatient Procedure: 2D Echo, Cardiac Doppler and Color Doppler Indications:    CHF-Acute Systolic G92.11  History:        Patient has prior history of Echocardiogram examinations, most                 recent 01/09/2015.  Sonographer:    Merrie Roof Referring Phys: [id number] [place]  1. Left ventricular ejection fraction, by estimation, is 70 to 75%. The left ventricle has hyperdynamic function. The left ventricle has no regional wall motion abnormalities. There is severe left ventricular hypertrophy. Left ventricular diastolic parameters are consistent with Grade II diastolic dysfunction (pseudonormalization). Elevated left atrial pressure.  2. Right ventricular systolic function is normal. The right ventricular size is normal. Tricuspid regurgitation signal is inadequate for assessing  PA pressure.  3. Left atrial size was severely dilated.  4. The mitral valve is normal in structure. Trivial mitral valve regurgitation.  5. The aortic valve is tricuspid. Aortic valve regurgitation is trivial. Mild aortic valve stenosis. Vmax 2.5 m/s, MG 15 mmHg, AVA 1.5 cm^2, DI 0.65  6. 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 Recommend PYP scan to evaluate for TTR amyloid and SPEP/UPEP/light chains to evaluate for AL amyloid FINDINGS  Left Ventricle: Left ventricular ejection fraction, by estimation, is 70 to 75%. The left ventricle has hyperdynamic function. The left ventricle has no regional wall motion abnormalities. The left ventricular internal cavity size was normal in size. There is severe left ventricular hypertrophy. Left ventricular diastolic parameters are consistent with Grade II diastolic dysfunction (pseudonormalization). Elevated left atrial pressure. Right Ventricle: The right ventricular size is normal. No increase in right ventricular wall thickness. Right ventricular systolic function is normal. Tricuspid regurgitation signal is inadequate for assessing PA pressure. Left Atrium: Left atrial size was severely dilated. Right Atrium: Right atrial size was normal in size. Pericardium: Trivial pericardial effusion is present. Mitral Valve: The mitral valve is normal in structure. Trivial mitral valve regurgitation. Tricuspid Valve: The tricuspid valve is normal in structure. Tricuspid valve regurgitation is trivial. Aortic Valve: The aortic valve is tricuspid. Aortic valve regurgitation is trivial. Mild aortic stenosis is present. Aortic valve mean gradient measures 13.5 mmHg. Aortic valve peak gradient measures 24.6 mmHg. Aortic valve area, by VTI measures 1.57 cm. Pulmonic Valve: The pulmonic valve was not well visualized. Pulmonic valve regurgitation is not visualized. Aorta: The aortic root and ascending aorta are structurally normal, with no evidence of  dilitation. IAS/Shunts: The interatrial septum was not well visualized.  LEFT VENTRICLE PLAX 2D LVIDd:         3.70 cm  Diastology LVIDs:         2.20 cm  LV e' medial:    6.42 cm/s LV PW:         1.80 cm  LV E/e' medial:  19.9 LV IVS:        1.60 cm  LV e' lateral:   7.62 cm/s LVOT diam:     1.70 cm  LV E/e' lateral: 16.8 LV SV:         93 LV SV Index:   57 LVOT Area:     2.27 cm  IVC IVC diam: 2.00 cm LEFT ATRIUM             Index LA diam:        5.10 cm 3.11 cm/m LA Vol (A2C):   68.1 ml 41.58 ml/m LA Vol (A4C):   79.3 ml 48.42 ml/m LA Biplane Vol: 81.7 ml 49.88 ml/m  AORTIC VALVE AV Area (Vmax):    1.60 cm AV Area (Vmean):   1.72 cm AV Area (VTI):     1.57 cm AV Vmax:           248.00 cm/s AV Vmean:          173.000 cm/s AV VTI:            0.594 m AV Peak Grad:      24.6 mmHg AV Mean Grad:      13.5 mmHg LVOT Vmax:         175.00 cm/s LVOT Vmean:        131.000 cm/s LVOT VTI:          0.410 m LVOT/AV VTI ratio: 0.69  AORTA Ao Root diam: 3.20 cm Ao Asc diam:  3.30 cm MITRAL VALVE MV Area (PHT): 2.62 cm     SHUNTS MV Decel Time: 289 msec     Systemic VTI:  0.41 m MV E velocity: 128.00 cm/s  Systemic Diam: 1.70 cm MV A velocity: 157.00 cm/s MV E/A ratio:  0.82 Oswaldo Milian MD Electronically signed by Oswaldo Milian MD Signature Date/Time: 06/03/2020/5:20:13 PM    Final     Patient Profile     70 y.o. female with past medical history of hypertension, hyperlipidemia, prior CVA, diabetes mellitus, chronic stage IV kidney disease being evaluated for acute on chronic diastolic congestive heart failure.  Echocardiogram shows normal LV function, severe left ventricular hypertrophy, grade 2 diastolic dysfunction, severe left atrial enlargement, trace aortic insufficiency, mild aortic stenosis with mean gradient 15 mmHg.  Myocardium felt concerning for amyloid.  Assessment & Plan    1. Acute on chronic diastolic congestive heart failure-patient states her symptoms have improved but she remains  dyspneic.  Would continue Lasix; follow renal function. .   2. Left ventricular hypertrophy-likely secondary to longstanding hypertension.  However we will arrange SPEP, UPEP, serum light chains and PYP scan as an outpatient. 3. Chronic stage IV kidney disease-renal function worse today but patient continues with dyspnea.  Would continue to diuresis and follow closely.  May need nephrology input. 4. Hypertension-blood pressure has improved since admission.  Continue present medications and follow. 5. Anemia-at least partially secondary to renal insufficiency.  Recent endoscopy unrevealing.  She apparently is scheduled for colonoscopy later this month. 6. Minimally elevated troponin-patient denies chest pain and no clear trend.  Not consistent with acute coronary syndrome.  No plans for further ischemia evaluation.   For questions or updates, please contact [place] Please consult [email or web address] for contact info under  Signed, Kirk Ruths, MD  06/05/2020, 9:11 AM

## 2020-06-05 NOTE — Progress Notes (Signed)
PROGRESS NOTE    [name]   AQT:[id number]  DOB: [birth year]  DOA: 06/02/2020 PCP: Erby Pian, PA-C   Brief Narrative:  [name] 70 year old female with past medical history of hyperlipidemia, gastroesophageal reflux disease, diabetes mellitus type 2, chronic kidney disease stage IV, hypertension and recent gastrointestinal bleed (normal EGD 4/27 however) who was just discharged from [facility] on 4/29 presenting with shortness of breath. Diagnosed with acute CHF in the ED and admitted for diuresis.    Subjective: Still short of breath. Has a dry cough. No other complaints.     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 Recommend PYP scan to evaluate for TTR amyloid and  SPEP/UPEP/light chains to evaluate for AL amyloid -cont IV lasix-   - weight 134.9> 131.5 - 1 and O - neg 3 L  - she still feels short of breath & has coarse crackles at bases   - CXR today> b/l infiltrates - no fever or leukocytosis to suggest pneumonia - appreciate cardiology eval-  - continue to diurese - f/u ambulatory pulse ox today  Active Problems:   Hypertensive urgency - BPs 211/66, 185/68 around the time of admission - on multiple meds including amlodipine, Clonidine, Toprol, Minoxidil- added Hydralazine and Imdur-  D/c Clonidine   Type 2 diabetes mellitus  - A1c 6.2 on 5/11 - dc'd Prandin and Acarbose - cont SSI   - follows with Dr Loanne Drilling     Anemia/ h/o GERD - recent GI bleed- Hb 9.6 > 8.1 > 7.9 - cont PPI  Note: Bromocriptine on med list- patient does not know why she is on this    Time spent in minutes: 35 DVT prophylaxis: enoxaparin (LOVENOX) injection 30 mg Start: 06/03/20 1400 Code Status: Full code Family Communication:  Level of Care: Level of  care: Telemetry Medical Disposition Plan:  Status is: Inpatient  Remains inpatient appropriate because:IV treatments appropriate due to intensity of illness or inability to take PO   Dispo: The patient is from: Home              Anticipated d/c is to: Home              Patient currently is not medically stable to d/c.   Difficult to place patient No      Consultants:   cardiology Procedures:    Antimicrobials:  Anti-infectives (From admission, onward)   None       Objective: Vitals:   06/05/20 0620 06/05/20 0806 06/05/20 0807 06/05/20 1118  BP: (!) 124/37  (!) 135/46 (!) 118/44  Pulse: 73  72 65  Resp: 20   18  Temp: 99.6 F (37.6 C)  98.6 F (37 C) 98.9 F (37.2 C)  TempSrc: Oral  Oral Oral  SpO2: 91% 95%  92%  Weight:      Height:        Intake/Output Summary (Last 24 hours) at 06/05/2020 1243 Last data filed at 06/05/2020 0600 Gross per 24 hour  Intake 440 ml  Output 1000 ml  Net -560 ml   Filed Weights   06/03/20 1825 06/04/20 0353 06/05/20 0000  Weight: 61.2 kg 61.1 kg 59.6 kg    Examination: General exam: Appears comfortable  HEENT: PERRLA,  oral mucosa moist, no sclera icterus or thrush Respiratory system: Crackles at bases. Respiratory effort normal. Cardiovascular system: S1 & S2 heard, RRR.   Gastrointestinal system: Abdomen soft, non-tender, nondistended. Normal bowel sounds. Central nervous system: Alert and oriented. No focal neurological deficits. Extremities: No cyanosis, clubbing or edema Skin: No rashes or ulcers Psychiatry:  Mood & affect appropriate.     Data Reviewed: I have personally reviewed following labs and imaging studies  CBC: Recent Labs  Lab 05/29/20 1423 06/02/20 2321 06/03/20 0821 06/05/20 0738  WBC  --  8.3 6.9 6.4  NEUTROABS  --  5.7 4.5  --   HGB 9.6* 8.7* 8.1* 7.9*  HCT  --  27.1* 24.8* 23.7*  MCV  --  95.1 96.1 96.7  PLT  --  286 274 235   Basic Metabolic Panel: Recent Labs  Lab 06/02/20 2321  06/04/20 0356 06/05/20 0342  NA 135 138 139  K 3.8 3.9 4.0  CL 101 102 103  CO2 24 26 26   GLUCOSE 117* 167* 95  BUN 72* 76* 76*  CREATININE 3.47* 3.40* 4.06*  CALCIUM 8.6* 8.5* 8.5*   GFR: Estimated Creatinine Clearance: 10.2 mL/min (A) (by C-G formula based on SCr of 4.06 mg/dL (H)). Liver Function Tests: Recent Labs  Lab 06/02/20 2321  AST 26  ALT 44  ALKPHOS 108  BILITOT 1.9*  PROT 6.8  ALBUMIN 3.2*   No results for input(s): LIPASE, AMYLASE in the last 168 hours. No results for input(s): AMMONIA in the last 168 hours. Coagulation Profile: No results for input(s): INR, PROTIME in the last 168 hours. Cardiac Enzymes: No results for input(s): CKTOTAL, CKMB, CKMBINDEX, TROPONINI in the last 168 hours. BNP (last 3 results) No results for input(s): PROBNP in the last 8760 hours. HbA1C: Recent Labs    06/03/20 0821  HGBA1C 6.2*   CBG: Recent Labs  Lab 06/04/20 1117 06/04/20 1636 06/04/20 2122 06/05/20 0620 06/05/20 1114  GLUCAP 188* 158* 166* 104* 154*   Lipid Profile: No results for input(s): CHOL, HDL, LDLCALC, TRIG, CHOLHDL, LDLDIRECT in the last 72 hours. Thyroid Function Tests: No results for input(s): TSH, T4TOTAL, FREET4, T3FREE, THYROIDAB in the last 72 hours. Anemia Panel: No results for input(s): VITAMINB12, FOLATE, FERRITIN, TIBC, IRON, RETICCTPCT in the last 72 hours. Urine analysis:    Component Value Date/Time   COLORURINE YELLOW 11/27/2019 2136   APPEARANCEUR HAZY (A) 11/27/2019 2136   LABSPEC 1.011 11/27/2019 2136   PHURINE 6.0 11/27/2019 2136   GLUCOSEU 150 (A) 11/27/2019 2136   GLUCOSEU NEGATIVE 09/04/2009 0721   HGBUR SMALL (A) 11/27/2019 2136   BILIRUBINUR NEGATIVE 11/27/2019 2136   BILIRUBINUR negative 09/24/2019 1003   KETONESUR NEGATIVE 11/27/2019 2136   PROTEINUR >=300 (A) 11/27/2019 2136   UROBILINOGEN 0.2 09/24/2019 1003   UROBILINOGEN 0.2 09/04/2009 0721   NITRITE NEGATIVE 11/27/2019 2136   LEUKOCYTESUR NEGATIVE  11/27/2019 2136   Sepsis Labs: @LABRCNTIP (procalcitonin:4,lacticidven:4) ) Recent Results (from the past 240 hour(s))  Resp Panel by RT-PCR (Flu A&B, Covid) Nasopharyngeal Swab     Status: None   Collection Time: 06/03/20 12:48 AM   Specimen: Nasopharyngeal Swab; Nasopharyngeal(NP) swabs in vial transport medium  Result Value Ref Range Status   SARS Coronavirus 2 by RT PCR NEGATIVE NEGATIVE Final    Comment: (NOTE) SARS-CoV-2 target nucleic acids are NOT DETECTED.  The SARS-CoV-2 RNA is generally detectable in upper respiratory specimens during the acute phase of infection. The lowest concentration of SARS-CoV-2 viral copies this assay can detect  is 138 copies/mL. A negative result does not preclude SARS-Cov-2 infection and should not be used as the sole basis for treatment or other patient management decisions. A negative result may occur with  improper specimen collection/handling, submission of specimen other than nasopharyngeal swab, presence of viral mutation(s) within the areas targeted by this assay, and inadequate number of viral copies(<138 copies/mL). A negative result must be combined with clinical observations, patient history, and epidemiological information. The expected result is Negative.  Fact Sheet for Patients:  EntrepreneurPulse.com.au  Fact Sheet for Healthcare Providers:  IncredibleEmployment.be  This test is no t yet approved or cleared by the [place] FDA and  has been authorized for detection and/or diagnosis of SARS-CoV-2 by FDA under an Emergency Use Authorization (EUA). This EUA will remain  in effect (meaning this test can be used) for the duration of the COVID-19 declaration under Section 564(b)(1) of the Act, 21 U.S.C.section 360bbb-3(b)(1), unless the authorization is terminated  or revoked sooner.       Influenza A by PCR NEGATIVE NEGATIVE Final   Influenza B by PCR NEGATIVE NEGATIVE Final     Comment: (NOTE) The Xpert Xpress SARS-CoV-2/FLU/RSV plus assay is intended as an aid in the diagnosis of influenza from Nasopharyngeal swab specimens and should not be used as a sole basis for treatment. Nasal washings and aspirates are unacceptable for Xpert Xpress SARS-CoV-2/FLU/RSV testing.  Fact Sheet for Patients: EntrepreneurPulse.com.au  Fact Sheet for Healthcare Providers: IncredibleEmployment.be  This test is not yet approved or cleared by the [place] FDA and has been authorized for detection and/or diagnosis of SARS-CoV-2 by FDA under an Emergency Use Authorization (EUA). This EUA will remain in effect (meaning this test can be used) for the duration of the COVID-19 declaration under Section 564(b)(1) of the Act, 21 U.S.C. section 360bbb-3(b)(1), unless the authorization is terminated or revoked.  Performed at [facility], [place] [address], [place], [place] [address]          Radiology Studies: DG Chest 2 View  Result Date: 06/05/2020 CLINICAL DATA:  Shortness of breath. EXAM: CHEST - 2 VIEW COMPARISON:  Chest x-rays dated 06/02/2020 and and 09/24/2019. FINDINGS: Prominent interstitial markings and/or ground-glass opacities are again seen throughout the lower lobes bilaterally, stable to slightly increased compared to most recent chest x-ray of 06/02/2020, new compared to the earlier study of 09/24/2019. Stable cardiomegaly. No pleural effusion or pneumothorax is seen. Osseous structures about the chest are unremarkable. IMPRESSION: Probable bibasilar pneumonia. COVID pneumonia can have this appearance. Alternatively, if afebrile, this could represent CHF with asymmetric pulmonary edema pattern. Electronically Signed   By: Franki Cabot M.D.   On: 06/05/2020 11:54   ECHOCARDIOGRAM COMPLETE  Result Date: 06/03/2020    ECHOCARDIOGRAM REPORT   Patient Name:   [name] Date of Exam: 06/03/2020 Medical Rec #:  [id number]          Height:       62.0 in Accession #:    [id number]        Weight:       139.0 lb Date of Birth:  December 16, 1950          BSA:          1.638 m Patient Age:    75 years          BP:           167/58 mmHg Patient Gender: F  HR:           69 bpm. Exam Location:  Inpatient Procedure: 2D Echo, Cardiac Doppler and Color Doppler Indications:    CHF-Acute Systolic G01.74  History:        Patient has prior history of Echocardiogram examinations, most                 recent 01/09/2015.  Sonographer:    Merrie Roof Referring Phys: [id number] [place]  1. Left ventricular ejection fraction, by estimation, is 70 to 75%. The left ventricle has hyperdynamic function. The left ventricle has no regional wall motion abnormalities. There is severe left ventricular hypertrophy. Left ventricular diastolic parameters are consistent with Grade II diastolic dysfunction (pseudonormalization). Elevated left atrial pressure.  2. Right ventricular systolic function is normal. The right ventricular size is normal. Tricuspid regurgitation signal is inadequate for assessing PA pressure.  3. Left atrial size was severely dilated.  4. The mitral valve is normal in structure. Trivial mitral valve regurgitation.  5. The aortic valve is tricuspid. Aortic valve regurgitation is trivial. Mild aortic valve stenosis. Vmax 2.5 m/s, MG 15 mmHg, AVA 1.5 cm^2, DI 0.65  6. 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 Recommend PYP scan to evaluate for TTR amyloid and SPEP/UPEP/light chains to evaluate for AL amyloid FINDINGS  Left Ventricle: Left ventricular ejection fraction, by estimation, is 70 to 75%. The left ventricle has hyperdynamic function. The left ventricle has no regional wall motion abnormalities. The left ventricular internal cavity size was normal in size. There is severe left ventricular hypertrophy. Left ventricular diastolic parameters are consistent with Grade II diastolic  dysfunction (pseudonormalization). Elevated left atrial pressure. Right Ventricle: The right ventricular size is normal. No increase in right ventricular wall thickness. Right ventricular systolic function is normal. Tricuspid regurgitation signal is inadequate for assessing PA pressure. Left Atrium: Left atrial size was severely dilated. Right Atrium: Right atrial size was normal in size. Pericardium: Trivial pericardial effusion is present. Mitral Valve: The mitral valve is normal in structure. Trivial mitral valve regurgitation. Tricuspid Valve: The tricuspid valve is normal in structure. Tricuspid valve regurgitation is trivial. Aortic Valve: The aortic valve is tricuspid. Aortic valve regurgitation is trivial. Mild aortic stenosis is present. Aortic valve mean gradient measures 13.5 mmHg. Aortic valve peak gradient measures 24.6 mmHg. Aortic valve area, by VTI measures 1.57 cm. Pulmonic Valve: The pulmonic valve was not well visualized. Pulmonic valve regurgitation is not visualized. Aorta: The aortic root and ascending aorta are structurally normal, with no evidence of dilitation. IAS/Shunts: The interatrial septum was not well visualized.  LEFT VENTRICLE PLAX 2D LVIDd:         3.70 cm  Diastology LVIDs:         2.20 cm  LV e' medial:    6.42 cm/s LV PW:         1.80 cm  LV E/e' medial:  19.9 LV IVS:        1.60 cm  LV e' lateral:   7.62 cm/s LVOT diam:     1.70 cm  LV E/e' lateral: 16.8 LV SV:         93 LV SV Index:   57 LVOT Area:     2.27 cm  IVC IVC diam: 2.00 cm LEFT ATRIUM             Index LA diam:        5.10 cm 3.11 cm/m LA Vol (  A2C):   68.1 ml 41.58 ml/m LA Vol (A4C):   79.3 ml 48.42 ml/m LA Biplane Vol: 81.7 ml 49.88 ml/m  AORTIC VALVE AV Area (Vmax):    1.60 cm AV Area (Vmean):   1.72 cm AV Area (VTI):     1.57 cm AV Vmax:           248.00 cm/s AV Vmean:          173.000 cm/s AV VTI:            0.594 m AV Peak Grad:      24.6 mmHg AV Mean Grad:      13.5 mmHg LVOT Vmax:         175.00  cm/s LVOT Vmean:        131.000 cm/s LVOT VTI:          0.410 m LVOT/AV VTI ratio: 0.69  AORTA Ao Root diam: 3.20 cm Ao Asc diam:  3.30 cm MITRAL VALVE MV Area (PHT): 2.62 cm     SHUNTS MV Decel Time: 289 msec     Systemic VTI:  0.41 m MV E velocity: 128.00 cm/s  Systemic Diam: 1.70 cm MV A velocity: 157.00 cm/s MV E/A ratio:  0.82 Oswaldo Milian MD Electronically signed by Oswaldo Milian MD Signature Date/Time: 06/03/2020/5:20:13 PM    Final       Scheduled Meds: . allopurinol  100 mg Oral Daily  . amLODipine  10 mg Oral Daily  . atorvastatin  80 mg Oral Daily  . bromocriptine  2.5 mg Oral Daily  . enoxaparin (LOVENOX) injection  30 mg Subcutaneous Daily  . furosemide  80 mg Intravenous Daily  . hydrALAZINE  50 mg Oral Q8H  . insulin aspart  0-9 Units Subcutaneous TID AC & HS  . isosorbide mononitrate  30 mg Oral Daily  . metoprolol succinate  100 mg Oral Daily  . minoxidil  10 mg Oral BID  . pantoprazole  40 mg Oral Daily  . polyethylene glycol  17 g Oral Daily  . sodium chloride flush  3 mL Intravenous Q12H   Continuous Infusions: . sodium chloride       LOS: 1 day      Debbe Odea, MD [facility] Pager: [email or web address] 06/05/2020, 12:43 PM

## 2020-06-06 ENCOUNTER — Inpatient Hospital Stay (HOSPITAL_COMMUNITY): Payer: Medicare Other

## 2020-06-06 ENCOUNTER — Encounter (HOSPITAL_COMMUNITY): Payer: Self-pay | Admitting: Internal Medicine

## 2020-06-06 DIAGNOSIS — I509 Heart failure, unspecified: Secondary | ICD-10-CM

## 2020-06-06 DIAGNOSIS — K219 Gastro-esophageal reflux disease without esophagitis: Secondary | ICD-10-CM | POA: Diagnosis not present

## 2020-06-06 DIAGNOSIS — R0602 Shortness of breath: Secondary | ICD-10-CM | POA: Diagnosis not present

## 2020-06-06 DIAGNOSIS — I5033 Acute on chronic diastolic (congestive) heart failure: Secondary | ICD-10-CM | POA: Diagnosis not present

## 2020-06-06 DIAGNOSIS — R778 Other specified abnormalities of plasma proteins: Secondary | ICD-10-CM | POA: Diagnosis not present

## 2020-06-06 DIAGNOSIS — R0989 Other specified symptoms and signs involving the circulatory and respiratory systems: Secondary | ICD-10-CM

## 2020-06-06 LAB — BASIC METABOLIC PANEL
Anion gap: 12 (ref 5–15)
BUN: 76 mg/dL — ABNORMAL HIGH (ref 8–23)
CO2: 23 mmol/L (ref 22–32)
Calcium: 8.6 mg/dL — ABNORMAL LOW (ref 8.9–10.3)
Chloride: 101 mmol/L (ref 98–111)
Creatinine, Ser: 4.67 mg/dL — ABNORMAL HIGH (ref 0.44–1.00)
GFR, Estimated: 10 mL/min — ABNORMAL LOW (ref >60)
Glucose, Bld: 113 mg/dL — ABNORMAL HIGH (ref 70–99)
Potassium: 3.9 mmol/L (ref 3.5–5.1)
Sodium: 136 mmol/L (ref 135–145)

## 2020-06-06 LAB — CBC
HCT: 27.9 % — ABNORMAL LOW (ref 36.0–46.0)
Hemoglobin: 9.1 g/dL — ABNORMAL LOW (ref 12.0–15.0)
MCH: 31.7 pg (ref 26.0–34.0)
MCHC: 32.6 g/dL (ref 30.0–36.0)
MCV: 97.2 fL (ref 80.0–100.0)
Platelets: 289 10*3/uL (ref 150–400)
RBC: 2.87 MIL/uL — ABNORMAL LOW (ref 3.87–5.11)
RDW: 16.3 % — ABNORMAL HIGH (ref 11.5–15.5)
WBC: 7.2 10*3/uL (ref 4.0–10.5)
nRBC: 0.3 % — ABNORMAL HIGH (ref 0.0–0.2)

## 2020-06-06 LAB — GLUCOSE, CAPILLARY
Glucose-Capillary: 146 mg/dL — ABNORMAL HIGH (ref 70–99)
Glucose-Capillary: 187 mg/dL — ABNORMAL HIGH (ref 70–99)
Glucose-Capillary: 192 mg/dL — ABNORMAL HIGH (ref 70–99)
Glucose-Capillary: 196 mg/dL — ABNORMAL HIGH (ref 70–99)

## 2020-06-06 LAB — PROCALCITONIN: Procalcitonin: 0.34 ng/mL

## 2020-06-06 MED ORDER — METOLAZONE 5 MG PO TABS
5.0000 mg | ORAL_TABLET | Freq: Every day | ORAL | Status: DC
  Administered 2020-06-06 – 2020-06-08 (×3): 5 mg via ORAL
  Filled 2020-06-06 (×3): qty 1

## 2020-06-06 MED ORDER — FUROSEMIDE 10 MG/ML IJ SOLN
160.0000 mg | Freq: Three times a day (TID) | INTRAMUSCULAR | Status: DC
  Administered 2020-06-06 – 2020-06-07 (×4): 160 mg via INTRAVENOUS
  Administered 2020-06-08: 80 mg via INTRAVENOUS
  Filled 2020-06-06 (×5): qty 16

## 2020-06-06 MED ORDER — FUROSEMIDE 10 MG/ML IJ SOLN: 120.0000 mg | Freq: Three times a day (TID) | INTRAMUSCULAR | Status: DC

## 2020-06-06 MED ORDER — FUROSEMIDE 10 MG/ML IJ SOLN
120.0000 mg | Freq: Three times a day (TID) | INTRAMUSCULAR | Status: DC
  Administered 2020-06-06: 120 mg via INTRAVENOUS
  Filled 2020-06-06: qty 12

## 2020-06-06 NOTE — Progress Notes (Addendum)
PROGRESS NOTE    [name]   VVO:[id number]  DOB: 01-31-1950  DOA: 06/02/2020 PCP: Erby Pian, PA-C   Brief Narrative:  [name] 70 year old female with past medical history of hyperlipidemia, gastroesophageal reflux disease, diabetes mellitus type 2, chronic kidney disease stage IV, hypertension and recent gastrointestinal bleed (normal EGD 4/27 however) who was just discharged from [facility] on 4/29 presenting with shortness of breath. Diagnosed with acute CHF in the ED and admitted for diuresis.    Subjective: She has no new complaints for me today. When examined, she appears more short of breath to me but she states she is not able to tell. She is slow to respond and is not giving me details about her dyspnea. She has no other complaints.     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 Recommend PYP scan to evaluate for TTR amyloid and  SPEP/UPEP/light chains to evaluate for AL amyloid -cont IV lasix-   - weight 134.9> 131.5> 132.7 - 1 and O - neg 3.9 L  - CXR 5/13 > b/l infiltrates - no leukocytosis- max temp 99.6 on 5/12 - appreciate cardiology eval-  - Weight overall down from admission although up from yesterday - patient not able to tell me if she is drinking more fluids- no oral intake documented from yesterday in the computer- I and O are inaccurate  - surprisingly, despite diuresis, she appears to be more short of breath and the infiltrates on her  CXR do not seem to be improved- BNP is the same as when admitted- dry cough unchanged-no leukocytosis- pro calcitonin normal- have asked for nephrology input on further diuresis vs dialysis  Active Problems:   Hypertensive urgency - BPs 211/66, 185/68 around the time of admission - on multiple  meds including amlodipine, Clonidine, Toprol - added Hydralazine and Imdur-  D/c'd Clonidine this admission   Type 2 diabetes mellitus  - A1c 6.2 on 5/11 - dc'd Prandin and Acarbose - cont SSI   - follows with Dr Loanne Drilling     Anemia/ h/o GERD - recent GI bleed- Hb 9.6 > 8.1 > 7.9> 9.1 - cont PPI  Note: Bromocriptine on med list- patient does not know why she is on this    Time spent in minutes: 35 DVT prophylaxis: enoxaparin (LOVENOX) injection 30 mg Start: 06/03/20 1400 Code Status: Full code Family Communication:  Level of Care: Level of care: Telemetry Medical Disposition Plan:  Status is: Inpatient  Remains inpatient appropriate because:IV treatments appropriate due to intensity of illness or inability to take PO   Dispo: The patient is from: Home              Anticipated d/c is to: Home              Patient currently is not medically stable to d/c.   Difficult to place patient No      Consultants:   Cardiology  pulmonary Procedures:    Antimicrobials:  Anti-infectives (From admission, onward)   None       Objective: Vitals:   06/05/20 1343 06/05/20 2250 06/06/20 0200 06/06/20 0210  BP: (!) 146/52 (!) 130/50  (!) 157/52  Pulse: 66 66  69  Resp: (!) 22 20  20  Temp: 99.1 F (37.3 C) 99 F (37.2 C)  98.8 F (37.1 C)  TempSrc: Oral Oral  Oral  SpO2: 97% 96%  95%  Weight:   60.2 kg   Height:        Intake/Output Summary (Last 24 hours) at 06/06/2020 0734 Last data filed at 06/06/2020 0700 Gross per 24 hour  Intake --  Output 800 ml  Net -800 ml   Filed Weights   06/04/20 0353 06/05/20 0000 06/06/20 0200  Weight: 61.1 kg 59.6 kg 60.2 kg    Examination: General exam: Appears comfortable  HEENT: PERRLA, oral mucosa moist, no sclera icterus or thrush Respiratory system: coarse crackles at bases- RR elevated when I was in the room- pulse ox checked by myself was in 90s. Cardiovascular system: S1 & S2 heard, regular rate and  rhythm Gastrointestinal system: Abdomen soft, non-tender, nondistended. Normal bowel sounds   Central nervous system: Alert and oriented. No focal neurological deficits. Extremities: No cyanosis, clubbing or edema Skin: No rashes or ulcers Psychiatry:  flat affect this morning    Data Reviewed: I have personally reviewed following labs and imaging studies  CBC: Recent Labs  Lab 06/02/20 2321 06/03/20 0821 06/05/20 0738 06/06/20 0653  WBC 8.3 6.9 6.4 7.2  NEUTROABS 5.7 4.5  --   --   HGB 8.7* 8.1* 7.9* 9.1*  HCT 27.1* 24.8* 23.7* 27.9*  MCV 95.1 96.1 96.7 97.2  PLT 286 274 278 400   Basic Metabolic Panel: Recent Labs  Lab 06/02/20 2321 06/04/20 0356 06/05/20 0342 06/06/20 0302  NA 135 138 139 136  K 3.8 3.9 4.0 3.9  CL 101 102 103 101  CO2 24 26 26 23   GLUCOSE 117* 167* 95 113*  BUN 72* 76* 76* 76*  CREATININE 3.47* 3.40* 4.06* 4.67*  CALCIUM 8.6* 8.5* 8.5* 8.6*   GFR: Estimated Creatinine Clearance: 9.6 mL/min (A) (by C-G formula based on SCr of 4.67 mg/dL (H)). Liver Function Tests: Recent Labs  Lab 06/02/20 2321  AST 26  ALT 44  ALKPHOS 108  BILITOT 1.9*  PROT 6.8  ALBUMIN 3.2*   No results for input(s): LIPASE, AMYLASE in the last 168 hours. No results for input(s): AMMONIA in the last 168 hours. Coagulation Profile: No results for input(s): INR, PROTIME in the last 168 hours. Cardiac Enzymes: No results for input(s): CKTOTAL, CKMB, CKMBINDEX, TROPONINI in the last 168 hours. BNP (last 3 results) No results for input(s): PROBNP in the last 8760 hours. HbA1C: Recent Labs    06/03/20 0821  HGBA1C 6.2*   CBG: Recent Labs  Lab 06/05/20 0620 06/05/20 1114 06/05/20 1602 06/05/20 2112 06/06/20 0543  GLUCAP 104* 154* 174* 158* 146*   Lipid Profile: No results for input(s): CHOL, HDL, LDLCALC, TRIG, CHOLHDL, LDLDIRECT in the last 72 hours. Thyroid Function Tests: No results for input(s): TSH, T4TOTAL, FREET4, T3FREE, THYROIDAB in the last 72  hours. Anemia Panel: No results for input(s): VITAMINB12, FOLATE, FERRITIN, TIBC, IRON, RETICCTPCT in the last 72 hours. Urine analysis:    Component Value Date/Time   COLORURINE YELLOW 11/27/2019 2136   APPEARANCEUR HAZY (A) 11/27/2019 2136   LABSPEC 1.011 11/27/2019 2136   PHURINE 6.0 11/27/2019 2136   GLUCOSEU 150 (A) 11/27/2019 2136   GLUCOSEU NEGATIVE 09/04/2009 0721   HGBUR SMALL (A) 11/27/2019 2136   BILIRUBINUR NEGATIVE 11/27/2019 2136   BILIRUBINUR negative 09/24/2019 1003   KETONESUR NEGATIVE 11/27/2019 2136   PROTEINUR >=300 (A) 11/27/2019 2136   UROBILINOGEN 0.2 09/24/2019 1003  UROBILINOGEN 0.2 09/04/2009 0721   NITRITE NEGATIVE 11/27/2019 2136   LEUKOCYTESUR NEGATIVE 11/27/2019 2136   Sepsis Labs: @LABRCNTIP (procalcitonin:4,lacticidven:4) ) Recent Results (from the past 240 hour(s))  Resp Panel by RT-PCR (Flu A&B, Covid) Nasopharyngeal Swab     Status: None   Collection Time: 06/03/20 12:48 AM   Specimen: Nasopharyngeal Swab; Nasopharyngeal(NP) swabs in vial transport medium  Result Value Ref Range Status   SARS Coronavirus 2 by RT PCR NEGATIVE NEGATIVE Final    Comment: (NOTE) SARS-CoV-2 target nucleic acids are NOT DETECTED.  The SARS-CoV-2 RNA is generally detectable in upper respiratory specimens during the acute phase of infection. The lowest concentration of SARS-CoV-2 viral copies this assay can detect is 138 copies/mL. A negative result does not preclude SARS-Cov-2 infection and should not be used as the sole basis for treatment or other patient management decisions. A negative result may occur with  improper specimen collection/handling, submission of specimen other than nasopharyngeal swab, presence of viral mutation(s) within the areas targeted by this assay, and inadequate number of viral copies(<138 copies/mL). A negative result must be combined with clinical observations, patient history, and epidemiological information. The expected result is  Negative.  Fact Sheet for Patients:  EntrepreneurPulse.com.au  Fact Sheet for Healthcare Providers:  IncredibleEmployment.be  This test is no t yet approved or cleared by the [place] FDA and  has been authorized for detection and/or diagnosis of SARS-CoV-2 by FDA under an Emergency Use Authorization (EUA). This EUA will remain  in effect (meaning this test can be used) for the duration of the COVID-19 declaration under Section 564(b)(1) of the Act, 21 U.S.C.section 360bbb-3(b)(1), unless the authorization is terminated  or revoked sooner.       Influenza A by PCR NEGATIVE NEGATIVE Final   Influenza B by PCR NEGATIVE NEGATIVE Final    Comment: (NOTE) The Xpert Xpress SARS-CoV-2/FLU/RSV plus assay is intended as an aid in the diagnosis of influenza from Nasopharyngeal swab specimens and should not be used as a sole basis for treatment. Nasal washings and aspirates are unacceptable for Xpert Xpress SARS-CoV-2/FLU/RSV testing.  Fact Sheet for Patients: EntrepreneurPulse.com.au  Fact Sheet for Healthcare Providers: IncredibleEmployment.be  This test is not yet approved or cleared by the [place] FDA and has been authorized for detection and/or diagnosis of SARS-CoV-2 by FDA under an Emergency Use Authorization (EUA). This EUA will remain in effect (meaning this test can be used) for the duration of the COVID-19 declaration under Section 564(b)(1) of the Act, 21 U.S.C. section 360bbb-3(b)(1), unless the authorization is terminated or revoked.  Performed at [facility], [place] [address] [place], [place] [address]          Radiology Studies: DG Chest 2 View  Result Date: 06/05/2020 CLINICAL DATA:  Shortness of breath. EXAM: CHEST - 2 VIEW COMPARISON:  Chest x-rays dated 06/02/2020 and and 09/24/2019. FINDINGS: Prominent interstitial markings and/or ground-glass opacities are again seen  throughout the lower lobes bilaterally, stable to slightly increased compared to most recent chest x-ray of 06/02/2020, new compared to the earlier study of 09/24/2019. Stable cardiomegaly. No pleural effusion or pneumothorax is seen. Osseous structures about the chest are unremarkable. IMPRESSION: Probable bibasilar pneumonia. COVID pneumonia can have this appearance. Alternatively, if afebrile, this could represent CHF with asymmetric pulmonary edema pattern. Electronically Signed   By: Franki Cabot M.D.   On: 06/05/2020 11:54      Scheduled Meds: . allopurinol  100 mg Oral Daily  . amLODipine  10 mg  Oral Daily  . atorvastatin  80 mg Oral Daily  . bromocriptine  2.5 mg Oral Daily  . enoxaparin (LOVENOX) injection  30 mg Subcutaneous Daily  . furosemide  80 mg Intravenous Daily  . hydrALAZINE  50 mg Oral Q8H  . insulin aspart  0-9 Units Subcutaneous TID AC & HS  . isosorbide mononitrate  30 mg Oral Daily  . metoprolol succinate  100 mg Oral Daily  . minoxidil  10 mg Oral BID  . pantoprazole  40 mg Oral Daily  . polyethylene glycol  17 g Oral Daily  . sodium chloride flush  3 mL Intravenous Q12H   Continuous Infusions: . sodium chloride       LOS: 2 days      Debbe Odea, MD [facility] Pager: [email or web address] 06/06/2020, 7:34 AM

## 2020-06-06 NOTE — Progress Notes (Signed)
Pt complains frequent episodes of SOB and tachypnea, Noted to desat at 88% in RA. Hook to 02 at [employer] at 94% sustaining. Fine crackles noted on the lower Bilateral lungs. -Paged hospitalist. CBC ordered.

## 2020-06-06 NOTE — Progress Notes (Signed)
Pt reports have a stool this am and endorsed it was very dark in color.

## 2020-06-06 NOTE — Progress Notes (Addendum)
Progress Note  Patient Name: [name] Date of Encounter: 06/06/2020  [facility] Cardiologist: Dr Debara Pickett  Subjective   Denies CP; dyspnea improved  Inpatient Medications    Scheduled Meds: . allopurinol  100 mg Oral Daily  . amLODipine  10 mg Oral Daily  . atorvastatin  80 mg Oral Daily  . bromocriptine  2.5 mg Oral Daily  . enoxaparin (LOVENOX) injection  30 mg Subcutaneous Daily  . furosemide  80 mg Intravenous Daily  . hydrALAZINE  50 mg Oral Q8H  . insulin aspart  0-9 Units Subcutaneous TID AC & HS  . isosorbide mononitrate  30 mg Oral Daily  . metoprolol succinate  100 mg Oral Daily  . minoxidil  10 mg Oral BID  . pantoprazole  40 mg Oral Daily  . polyethylene glycol  17 g Oral Daily  . sodium chloride flush  3 mL Intravenous Q12H   Continuous Infusions: . sodium chloride     PRN Meds: sodium chloride, acetaminophen, ondansetron (ZOFRAN) IV, sodium chloride flush   Vital Signs    Vitals:   06/05/20 2250 06/06/20 0200 06/06/20 0210 06/06/20 0811  BP: (!) 130/50  (!) 157/52   Pulse: 66  69   Resp: 20  20   Temp: 99 F (37.2 C)  98.8 F (37.1 C)   TempSrc: Oral  Oral   SpO2: 96%  95%   Weight:  60.2 kg  60.1 kg  Height:        Intake/Output Summary (Last 24 hours) at 06/06/2020 1046 Last data filed at 06/06/2020 0951 Gross per 24 hour  Intake 120 ml  Output 900 ml  Net -780 ml   Last 3 Weights 06/06/2020 06/06/2020 06/05/2020  Weight (lbs) 132 lb 9.6 oz 132 lb 11.2 oz 131 lb 8 oz  Weight (kg) 60.147 kg 60.192 kg 59.648 kg      Telemetry    Sinus with pacs and NSVT - Personally Reviewed   Physical Exam   GEN: NAD Neck: supple, no JVD Cardiac: RRR, 2/6 systolic murmur, no gallop Respiratory: CTA GI: Soft, NT/ND MS: No edema Neuro:  Grossly intact Psych: Normal affect   Labs    High Sensitivity Troponin:   Recent Labs  Lab 06/03/20 0044 06/03/20 0230  TROPONINIHS 20* 26*      Chemistry Recent Labs  Lab 06/02/20 2321  06/04/20 0356 06/05/20 0342 06/06/20 0302  NA 135 138 139 136  K 3.8 3.9 4.0 3.9  CL 101 102 103 101  CO2 24 26 26 23   GLUCOSE 117* 167* 95 113*  BUN 72* 76* 76* 76*  CREATININE 3.47* 3.40* 4.06* 4.67*  CALCIUM 8.6* 8.5* 8.5* 8.6*  PROT 6.8  --   --   --   ALBUMIN 3.2*  --   --   --   AST 26  --   --   --   ALT 44  --   --   --   ALKPHOS 108  --   --   --   BILITOT 1.9*  --   --   --   GFRNONAA 14* 14* 11* 10*  ANIONGAP 10 10 10 12      Hematology Recent Labs  Lab 06/03/20 0821 06/05/20 0738 06/06/20 0653  WBC 6.9 6.4 7.2  RBC 2.58* 2.45* 2.87*  HGB 8.1* 7.9* 9.1*  HCT 24.8* 23.7* 27.9*  MCV 96.1 96.7 97.2  MCH 31.4 32.2 31.7  MCHC 32.7 33.3 32.6  RDW 15.6* 16.1* 16.3*  PLT 274 278 289    BNP Recent Labs  Lab 06/03/20 0044 06/05/20 1217  BNP 669.8* 643.2*      Radiology    DG Chest 2 View  Result Date: 06/05/2020 CLINICAL DATA:  Shortness of breath. EXAM: CHEST - 2 VIEW COMPARISON:  Chest x-rays dated 06/02/2020 and and 09/24/2019. FINDINGS: Prominent interstitial markings and/or ground-glass opacities are again seen throughout the lower lobes bilaterally, stable to slightly increased compared to most recent chest x-ray of 06/02/2020, new compared to the earlier study of 09/24/2019. Stable cardiomegaly. No pleural effusion or pneumothorax is seen. Osseous structures about the chest are unremarkable. IMPRESSION: Probable bibasilar pneumonia. COVID pneumonia can have this appearance. Alternatively, if afebrile, this could represent CHF with asymmetric pulmonary edema pattern. Electronically Signed   By: Franki Cabot M.D.   On: 06/05/2020 11:54    Patient Profile     70 y.o. female with past medical history of hypertension, hyperlipidemia, prior CVA, diabetes mellitus, chronic stage IV kidney disease being evaluated for acute on chronic diastolic congestive heart failure.  Echocardiogram shows normal LV function, severe left ventricular hypertrophy, grade 2  diastolic dysfunction, severe left atrial enlargement, trace aortic insufficiency, mild aortic stenosis with mean gradient 15 mmHg.  Myocardium felt concerning for amyloid.  Assessment & Plan    1. Acute on chronic diastolic congestive heart failure-patient appears to be improved today.  Renal function worse.  We will hold diuresis for now. 2. Left ventricular hypertrophy-likely secondary to longstanding hypertension.  However we will arrange SPEP, UPEP, serum light chains and PYP scan as an outpatient. 3. Chronic stage IV kidney disease-renal function worse; hold on diuresis.  May need nephrology to follow. 4. Hypertension-blood pressure has improved since admission.  Continue present medications and follow. 5. Anemia-at least partially secondary to renal insufficiency.  Recent endoscopy unrevealing.  She apparently is scheduled for colonoscopy later this month. 6. Minimally elevated troponin-patient denies chest pain and no clear trend.  Not consistent with acute coronary syndrome.  No plans for further ischemia evaluation.   For questions or updates, please contact [place] Please consult [email or web address] for contact info under        Signed, Kirk Ruths, MD  06/06/2020, 10:46 AM

## 2020-06-06 NOTE — Consult Note (Signed)
Renal Service Consult Note [facility]  [name] 06/06/2020 Sol Blazing, MD Requesting Physician: [name]  Reason for Consult: Renal failure HPI: The patient is a 70 y.o. year-old w/ hx of tobacco use, CVA, HTN, HL, DM2, CKD IV, recent GIB (April 2022, egd negative) who presented to ED on 4/29 for SOB. In ED was dx'd with acute CHF and admitted for diuresis. Despite IV lasix 60- 105m pt does not seem to be improving and creat is up from 3.4 on admission to 4.67 today. Asked to see for renal failure.   [name].  She lives alone, has family in town.    No specific c/o today, having some DOE and cough. Is on 2L [place].    ROS  denies CP  no joint pain   no HA  no blurry vision  no rash  no diarrhea  no nausea/ vomiting  no dysuria  no difficulty voiding  no change in urine color    Past Medical History  Past Medical History:  Diagnosis Date  . Acute kidney injury ([place] 01/08/2015  . Arthritis   . Chronic back pain   . Constipation   . Cough   . Diabetes mellitus, type 2 ([place]   . Diverticulosis   . Fibroid    patient thinks this was the reason for her hysterectomy  . GERD (gastroesophageal reflux disease)   . Heart murmur   . History of sebaceous cyst   . Hyperlipemia   . Hyperplastic colon polyp   . Hypertension   . Hyperthyroidism   . Lumbar radiculopathy   . Shortness of breath 09/13/2013  . Stroke ([place]?   x4   . Tobacco abuse   . 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 ABDOMINAL HYSTERECTOMY     --?ovaries remain  . COLONOSCOPY W/ BIOPSIES  09/11/2008  . ESOPHAGOGASTRODUODENOSCOPY (EGD) WITH PROPOFOL N/A 05/20/2020   Procedure: ESOPHAGOGASTRODUODENOSCOPY (EGD) WITH PROPOFOL;  Surgeon: PIrene Shipper MD;  [facility]  Service:  Endoscopy;  Laterality: N/A;  . RETINOPATHY SURGERY Bilateral 2013   Dr. mZigmund [name]  Family History  Family History  Problem Relation Age of Onset  . Hypertension Mother   . Sleep apnea Mother   . Diabetes Mother   . Hyperlipidemia Mother   . Lung cancer Father        smoker  . Hypertension Sister   . Diabetes Sister   . Hypertension Brother   . Hypertension Sister   . Diabetes Brother   . Hypertension Brother   . Breast cancer Neg Hx   . Colon cancer Neg Hx   . Esophageal cancer Neg Hx   . Pancreatic cancer Neg Hx   . Liver disease Neg Hx   . Colon polyps Neg Hx    Social History  reports that she quit smoking about 5 years ago. Her smoking use included cigarettes. She has a 17.50 pack-year smoking history. She has never used smokeless tobacco. She reports that she does not drink alcohol and does not use drugs. Allergies  Allergies  Allergen Reactions  . Semaglutide Rash    Rybelsus 3 mg   Home medications Prior to Admission medications   Medication Sig Start Date End Date Taking? Authorizing Provider  acarbose (PRECOSE) 25 MG  tablet Take 0.5 tablets (12.5 mg total) by mouth 3 (three) times daily with meals. 02/25/20  Yes Renato Shin, MD  allopurinol (ZYLOPRIM) 100 MG tablet Take 1 tablet (100 mg total) by mouth daily. 05/23/20  Yes Ghimire, Henreitta Leber, MD  amLODipine (NORVASC) 10 MG tablet TAKE 1 TABLET BY MOUTH EVERY DAY Patient taking differently: Take 10 mg by mouth daily. 03/16/20  Yes Marrian Salvage, FNP  aspirin 325 MG tablet Take 325 mg by mouth daily.   Yes [provider]  atorvastatin (LIPITOR) 80 MG tablet TAKE 1 TABLET BY MOUTH EVERY DAY Patient taking differently: Take 80 mg by mouth daily. 03/30/20  Yes Marrian Salvage, FNP  bromocriptine (PARLODEL) 2.5 MG tablet Take 0.5 tablets (1.25 mg total) by mouth daily. Patient taking differently: Take 2.5 mg by mouth daily. 02/24/20  Yes Renato Shin, MD  cloNIDine (CATAPRES) 0.2 MG tablet Take 1  tablet (0.2 mg total) by mouth 3 (three) times daily. 05/21/19  Yes Marrian Salvage, FNP  epoetin alfa-epbx (RETACRIT) 50354 UNIT/ML injection Inject 40,000 Units into the skin every 28 (twenty-eight) days.   Yes [provider]  feeding supplement (ENSURE ENLIVE / ENSURE PLUS) LIQD Take 237 mLs by mouth 2 (two) times daily between meals. Patient taking differently: Take 237 mLs by mouth daily as needed (additional calories). 05/22/20 06/21/20 Yes Ghimire, Henreitta Leber, MD  furosemide (LASIX) 80 MG tablet Take 80 mg by mouth daily.   Yes [provider]  metoprolol succinate (TOPROL-XL) 100 MG 24 hr tablet Take 100 mg by mouth daily.   Yes [provider]  minoxidil (LONITEN) 10 MG tablet Take 10 mg by mouth 2 (two) times daily.   Yes [provider]  pantoprazole (PROTONIX) 40 MG tablet Take 1 tablet (40 mg total) by mouth daily. 06/12/19  Yes Marrian Salvage, FNP  polyethylene glycol (MIRALAX / GLYCOLAX) 17 g packet Take 34-51 g by mouth daily as needed for mild constipation.   Yes [provider]  repaglinide (PRANDIN) 2 MG tablet Take 1 tablet (2 mg total) by mouth 2 (two) times daily before a meal. 01/10/20  Yes Renato Shin, MD  Blood Glucose Monitoring Suppl (Hope) w/Device KIT 1 each by Does not apply route 2 (two) times daily. E11.9    [provider]  glucose blood (ONETOUCH VERIO) test strip USE AS DIRECTED TWICE DAILY 12/12/19   Marrian Salvage, FNP  OneTouch Delica Lancets 65K MISC 1 each by Does not apply route 2 (two) times daily. E11.9    [provider]     Vitals:   06/06/20 8127 06/06/20 1120 06/06/20 1308 06/06/20 1309  BP:  (!) 148/56    Pulse:  70    Resp:  19    Temp:  98.5 F (36.9 C)    TempSrc:  Oral    SpO2:  90% (!) 86% 92%  Weight: 60.1 kg     Height:       Exam Gen alert, no distress No rash, cyanosis or gangrene Sclera anicteric, throat clear  +JVD Chest  bilat crackles 1/3 up, no wheezing RRR no MRG Abd soft ntnd no mass or ascites +bs GU normal MS no joint effusions or deformity Ext no LE or UE edema, no wounds or ulcers Neuro is alert, Ox 3 , nf   Home meds:  - norvasc 10 qd/ clonidine 0.2 tid/ lasix 80 qd/ metoprolol xl 100 qd/ minoxidil 10 bid  - repaglinide  19m bid/ precose 12.5 tid ac   - protonix 40 qd/ parlodel 2.5 qd  - asa 325 qd/ lipitor 80 qd/ allopurinol 100 qd  - prn's/ vitamins/ supplements    Date    Creat  eGFR   2011- 2014   0.67- 0.80   2015- 2018   1.41- 1.88    2019    2.03- 2.62   2020    2.28   Aug - oct 2021  2.09- 2.70   Nov 2021   2.76- 3.98 12- 18, stage V    Dec 2021   2.89   Jan 2022   2.87  17, stage IV   May 01, 2020   3.51  13, stage V     April 15   3.62  13, stage V   April 25- Jun 06, 2020 3.78- 4.67 Current admit        CXR 5/13 - IMPRESSION: Probable bibasilar pneumonia. COVID pneumonia can have this appearance. Alternatively, if afebrile, this could represent CHF with asymmetric pulmonary edema pattern.    No fever or ^wbc    BP's normal to high since admit, diast BP's are lower though    HR 55- 70, RR 12-20, afeb   [place] 2 L 92 %     I/O since admission = 1.6 L in and 5.7 L out = net - 4.0 L    Wt's stable at 60kg today (61kg on admit 5/10)     Renal [place]14 today - reading is pending     UNa, UCr - pending     UA pending    Assessment/ Plan: 1. AKI on CKD IV - b/l creat 2.8 from Jan 2022. Pt admitted w/ CHF exacerbation / pulm edema. Has diuresed here per I/O's however still has rales and O2 requirement, and repeat CXR no better and wt's aren't down significantly. Creat up 3.4 on admit and up to 4.6 today. BP's are okay. Will maximize IV lasix dosing and start po zaroxolyn and start fluid restriction 1200 cc /d w/ renal diet. Get urine lytes, UA and renal [place] No uremic symptoms, no indication for dialysis yet. Will follow.  2. A/C diast CHF - as above 3. HTN - BP's okay, on  meds. 4. Mild ^Cleora Fleet- seen by cardiology, not c/w ACS.       RKelly Splinter MD 06/06/2020, 3:14 PM  Recent Labs  Lab 06/05/20 0738 06/06/20 0653  WBC 6.4 7.2  HGB 7.9* 9.1*   Recent Labs  Lab 06/05/20 0342 06/06/20 0302  K 4.0 3.9  BUN 76* 76*  CREATININE 4.06* 4.67*  CALCIUM 8.5* 8.6*

## 2020-06-07 DIAGNOSIS — I5033 Acute on chronic diastolic (congestive) heart failure: Secondary | ICD-10-CM | POA: Diagnosis not present

## 2020-06-07 LAB — URINALYSIS, ROUTINE W REFLEX MICROSCOPIC
Bilirubin Urine: NEGATIVE
Glucose, UA: NEGATIVE mg/dL
Hgb urine dipstick: NEGATIVE
Ketones, ur: NEGATIVE mg/dL
Leukocytes,Ua: NEGATIVE
Nitrite: NEGATIVE
Protein, ur: 100 mg/dL — AB
Specific Gravity, Urine: 1.008 (ref 1.005–1.030)
pH: 5 (ref 5.0–8.0)

## 2020-06-07 LAB — BASIC METABOLIC PANEL
Anion gap: 15 (ref 5–15)
BUN: 82 mg/dL — ABNORMAL HIGH (ref 8–23)
CO2: 23 mmol/L (ref 22–32)
Calcium: 8.9 mg/dL (ref 8.9–10.3)
Chloride: 98 mmol/L (ref 98–111)
Creatinine, Ser: 4.29 mg/dL — ABNORMAL HIGH (ref 0.44–1.00)
GFR, Estimated: 11 mL/min — ABNORMAL LOW (ref >60)
Glucose, Bld: 173 mg/dL — ABNORMAL HIGH (ref 70–99)
Potassium: 4.2 mmol/L (ref 3.5–5.1)
Sodium: 136 mmol/L (ref 135–145)

## 2020-06-07 LAB — GLUCOSE, CAPILLARY
Glucose-Capillary: 176 mg/dL — ABNORMAL HIGH (ref 70–99)
Glucose-Capillary: 187 mg/dL — ABNORMAL HIGH (ref 70–99)
Glucose-Capillary: 211 mg/dL — ABNORMAL HIGH (ref 70–99)
Glucose-Capillary: 182 mg/dL — ABNORMAL HIGH (ref 70–99)

## 2020-06-07 LAB — CREATININE, URINE, RANDOM: Creatinine, Urine: 59.41 mg/dL

## 2020-06-07 LAB — SODIUM, URINE, RANDOM: Sodium, Ur: 100 mmol/L

## 2020-06-07 NOTE — Progress Notes (Signed)
Progress Note  Patient Name: [name] Date of Encounter: 06/07/2020  [facility] Cardiologist: Dr Debara Pickett  Subjective   No CP or dyspnea  Inpatient Medications    Scheduled Meds: . allopurinol  100 mg Oral Daily  . amLODipine  10 mg Oral Daily  . atorvastatin  80 mg Oral Daily  . bromocriptine  2.5 mg Oral Daily  . enoxaparin (LOVENOX) injection  30 mg Subcutaneous Daily  . furosemide  160 mg Intravenous Q8H  . hydrALAZINE  50 mg Oral Q8H  . insulin aspart  0-9 Units Subcutaneous TID AC & HS  . isosorbide mononitrate  30 mg Oral Daily  . metolazone  5 mg Oral Daily  . metoprolol succinate  100 mg Oral Daily  . minoxidil  10 mg Oral BID  . pantoprazole  40 mg Oral Daily  . polyethylene glycol  17 g Oral Daily  . sodium chloride flush  3 mL Intravenous Q12H   Continuous Infusions: . sodium chloride     PRN Meds: sodium chloride, acetaminophen, ondansetron (ZOFRAN) IV, sodium chloride flush   Vital Signs    Vitals:   06/06/20 1309 06/06/20 1632 06/06/20 1923 06/07/20 0343  BP:  (!) 154/54 (!) 135/42 (!) 150/40  Pulse:  68 71 79  Resp:  20 18 19   Temp:  98.3 F (36.8 C) 98.4 F (36.9 C) 99.1 F (37.3 C)  TempSrc:  Oral Oral Oral  SpO2: 92% 92% 91% 90%  Weight:    58.5 kg  Height:        Intake/Output Summary (Last 24 hours) at 06/07/2020 1037 Last data filed at 06/07/2020 1007 Gross per 24 hour  Intake 120 ml  Output 1150 ml  Net -1030 ml   Last 3 Weights 06/07/2020 06/06/2020 06/06/2020  Weight (lbs) 128 lb 15.5 oz 132 lb 9.6 oz 132 lb 11.2 oz  Weight (kg) 58.5 kg 60.147 kg 60.192 kg      Telemetry    Sinus with pacs and NSVT - Personally Reviewed   Physical Exam   GEN: NAD WD Neck: supple Cardiac: RRR, 2/6 systolic murmur, no DM Respiratory: CTA; no wheeze GI: Soft, NT/ND, no masses MS: No edema Neuro:  Grossly intact Psych: Normal affect   Labs    High Sensitivity Troponin:   Recent Labs  Lab 06/03/20 0044 06/03/20 0230   TROPONINIHS 20* 26*      Chemistry Recent Labs  Lab 06/02/20 2321 06/04/20 0356 06/05/20 0342 06/06/20 0302 06/07/20 0326  NA 135   < > 139 136 136  K 3.8   < > 4.0 3.9 4.2  CL 101   < > 103 101 98  CO2 24   < > 26 23 23   GLUCOSE 117*   < > 95 113* 173*  BUN 72*   < > 76* 76* 82*  CREATININE 3.47*   < > 4.06* 4.67* 4.29*  CALCIUM 8.6*   < > 8.5* 8.6* 8.9  PROT 6.8  --   --   --   --   ALBUMIN 3.2*  --   --   --   --   AST 26  --   --   --   --   ALT 44  --   --   --   --   ALKPHOS 108  --   --   --   --   BILITOT 1.9*  --   --   --   --  GFRNONAA 14*   < > 11* 10* 11*  ANIONGAP 10   < > 10 12 15    < > = values in this interval not displayed.     Hematology Recent Labs  Lab 06/03/20 0821 06/05/20 0738 06/06/20 0653  WBC 6.9 6.4 7.2  RBC 2.58* 2.45* 2.87*  HGB 8.1* 7.9* 9.1*  HCT 24.8* 23.7* 27.9*  MCV 96.1 96.7 97.2  MCH 31.4 32.2 31.7  MCHC 32.7 33.3 32.6  RDW 15.6* 16.1* 16.3*  PLT 274 278 289    BNP Recent Labs  Lab 06/03/20 0044 06/05/20 1217  BNP 669.8* 643.2*      Radiology    DG Chest 2 View  Result Date: 06/05/2020 CLINICAL DATA:  Shortness of breath. EXAM: CHEST - 2 VIEW COMPARISON:  Chest x-rays dated 06/02/2020 and and 09/24/2019. FINDINGS: Prominent interstitial markings and/or ground-glass opacities are again seen throughout the lower lobes bilaterally, stable to slightly increased compared to most recent chest x-ray of 06/02/2020, new compared to the earlier study of 09/24/2019. Stable cardiomegaly. No pleural effusion or pneumothorax is seen. Osseous structures about the chest are unremarkable. IMPRESSION: Probable bibasilar pneumonia. COVID pneumonia can have this appearance. Alternatively, if afebrile, this could represent CHF with asymmetric pulmonary edema pattern. Electronically Signed   By: Franki Cabot M.D.   On: 06/05/2020 11:54   US RENAL  Result Date: 06/06/2020 CLINICAL DATA:  Acute on chronic kidney disease EXAM: RENAL /  URINARY TRACT ULTRASOUND COMPLETE COMPARISON:  CT abdomen pelvis 05/08/2020 FINDINGS: Right Kidney: Renal measurements: 10.4 x 4.6 x 5.5 cm = volume: 138 mL. Echogenicity is increased. No mass or hydronephrosis visualized. Left Kidney: Renal measurements: 9.5 x 5.7 x 4.5 cm = volume: 153 mL. Echogenicity is increased. No hydronephrosis. There is a subcentimeter cyst in the lateral aspect of the kidney. Bladder: Appears normal for degree of bladder distention. Other: Incidentally noted small right pleural effusion. IMPRESSION: 1.  No acute finding sonographically in the bilateral kidneys. 2. Increased echogenicity of the bilateral kidneys as can be seen in medical renal disease. Subcentimeter cyst in the left kidney. Electronically Signed   By: Audie Pinto M.D.   On: 06/06/2020 16:35    Patient Profile     70 y.o. female with past medical history of hypertension, hyperlipidemia, prior CVA, diabetes mellitus, chronic stage IV kidney disease being evaluated for acute on chronic diastolic congestive heart failure.  Echocardiogram shows normal LV function, severe left ventricular hypertrophy, grade 2 diastolic dysfunction, severe left atrial enlargement, trace aortic insufficiency, mild aortic stenosis with mean gradient 15 mmHg.  Myocardium felt concerning for amyloid.  Assessment & Plan    1. Acute on chronic diastolic congestive heart failure-symptoms are improving.  Nephrology has evaluated and has increased diuretics.  Continue to follow renal function. 2. Left ventricular hypertrophy-likely secondary to longstanding hypertension.  However we will arrange SPEP, UPEP, serum light chains and PYP scan as an outpatient. 3. Chronic stage IV kidney disease-as above nephrology following and has increased diuretics. 4. Hypertension-blood pressure has improved since admission.  Continue present medications and follow. 5. Anemia-at least partially secondary to renal insufficiency.  Recent endoscopy  unrevealing.  She apparently is scheduled for colonoscopy later this month. 6. Minimally elevated troponin-patient denies chest pain and no clear trend.  Not consistent with acute coronary syndrome.  No plans for further ischemia evaluation.   For questions or updates, please contact [place] Please consult [email or web address] for contact info under  Signed, Kirk Ruths, MD  06/07/2020, 10:37 AM

## 2020-06-07 NOTE — Progress Notes (Addendum)
PROGRESS NOTE    [name]   OMB:[id number]  DOB: 12/16/1950  DOA: 06/02/2020 PCP: Erby Pian, PA-C   Brief Narrative:  [name] 70 year old female with past medical history of hyperlipidemia, gastroesophageal reflux disease, diabetes mellitus type 2, chronic kidney disease stage IV, hypertension and recent gastrointestinal bleed (normal EGD 4/27 however) who was just discharged from [facility] on 4/29 presenting with shortness of breath. Diagnosed with acute CHF in the ED and admitted for diuresis.    Subjective: No complaints today.   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  - cardiology recommends outpatient PYP scan to evaluate for TTR amyloid and  SPEP/UPEP/light chains to evaluate for AL amyloid - CXR 5/13 > persistent b/l infiltrates- appear to be worsening  - BNP 669.8>> 643.2 and thus not much improved - 5/14 - pulse ox dropped to 80s on room air - no leukocytosis- max temp 99.6 on 5/12- low suspicion for infection - Nephrology assisting with diuresis now- Cr improving- no need for dialysis yet per Nephro- appreciate assistance    Active Problems:   Hypertensive urgency - BPs 211/66, 185/68 around the time of admission - on multiple meds including amlodipine, Clonidine, Toprol - added Hydralazine and Imdur-  D/c'd Clonidine this admission - BPs now 453-646O systolic- follow with diuresis   Type 2 diabetes mellitus  - A1c 6.2 on 5/11 - dc'd Prandin and Acarbose for now - cont SSI   - follows with Dr Loanne Drilling     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  - follow  Note: Bromocriptine on med list- patient does not know why she is on this    Time spent in minutes: 35 DVT prophylaxis: enoxaparin (LOVENOX) injection 30 mg Start: 06/03/20 1400 Code Status: Full code Family Communication:  Level of Care: Level of care: Telemetry Medical Disposition Plan:  Status is: Inpatient  Remains inpatient appropriate because:IV treatments appropriate due to intensity of illness or inability to take PO   Dispo: The patient is from: Home              Anticipated d/c is to: Home              Patient currently is not medically stable to d/c.   Difficult to place patient No   Consultants:   Cardiology  pulmonary Procedures:    Antimicrobials:  Anti-infectives (From admission, onward)   None       Objective: Vitals:   06/06/20 1309 06/06/20 1632 06/06/20 1923 06/07/20 0343  BP:  (!) 154/54 (!) 135/42 (!) 150/40  Pulse:  68 71 79  Resp:  20 18 19   Temp:  98.3 F (36.8 C) 98.4 F (  36.9 C) 99.1 F (37.3 C)  TempSrc:  Oral Oral Oral  SpO2: 92% 92% 91% 90%  Weight:    58.5 kg  Height:        Intake/Output Summary (Last 24 hours) at 06/07/2020 1031 Last data filed at 06/07/2020 1007 Gross per 24 hour  Intake 120 ml  Output 1150 ml  Net -1030 ml   Filed Weights   06/06/20 0200 06/06/20 0811 06/07/20 0343  Weight: 60.2 kg 60.1 kg 58.5 kg    Examination: General exam: Appears comfortable  HEENT: PERRLA, oral mucosa moist, no sclera icterus or thrush Respiratory system: crackles at  b/l bases Respiratory effort normal. Cardiovascular system: S1 & S2 heard, regular rate and  rhythm Gastrointestinal system: Abdomen soft, non-tender, nondistended. Normal bowel sounds   Central nervous system: Alert and oriented. No focal neurological deficits. Extremities: No cyanosis, clubbing or edema Skin: No rashes or ulcers Psychiatry:  flat affect   Data Reviewed: I have personally reviewed following labs and imaging studies  CBC: Recent Labs  Lab 06/02/20 2321 06/03/20 0821 06/05/20 0738 06/06/20 0653  WBC 8.3 6.9 6.4 7.2  NEUTROABS 5.7 4.5  --   --   HGB 8.7* 8.1* 7.9* 9.1*  HCT 27.1* 24.8* 23.7* 27.9*  MCV 95.1 96.1 96.7 97.2  PLT 286 274 278 509   Basic Metabolic Panel: Recent Labs  Lab 06/02/20 2321 06/04/20 0356 06/05/20 0342 06/06/20 0302 06/07/20 0326  NA 135 138 139 136 136  K 3.8 3.9 4.0 3.9 4.2  CL 101 102 103 101 98  CO2 24 26 26 23 23   GLUCOSE 117* 167* 95 113* 173*  BUN 72* 76* 76* 76* 82*  CREATININE 3.47* 3.40* 4.06* 4.67* 4.29*  CALCIUM 8.6* 8.5* 8.5* 8.6* 8.9   GFR: Estimated Creatinine Clearance: 9.7 mL/min (A) (by C-G formula based on SCr of 4.29 mg/dL (H)). Liver Function Tests: Recent Labs  Lab 06/02/20 2321  AST 26  ALT 44  ALKPHOS 108  BILITOT 1.9*  PROT 6.8  ALBUMIN 3.2*   No results for input(s): LIPASE, AMYLASE in the last 168 hours. No results for input(s): AMMONIA in the last 168 hours. Coagulation Profile: No results for input(s): INR, PROTIME in the last 168 hours. Cardiac Enzymes: No results for input(s): CKTOTAL, CKMB, CKMBINDEX, TROPONINI in the last 168 hours. BNP (last 3 results) No results for input(s): PROBNP in the last 8760 hours. HbA1C: No results for input(s): HGBA1C in the last 72 hours. CBG: Recent Labs  Lab 06/06/20 0543 06/06/20 1123 06/06/20 1628 06/06/20 2119 06/07/20 0620  GLUCAP 146* 187* 192* 196* 176*   Lipid Profile: No results for input(s): CHOL, HDL, LDLCALC, TRIG, CHOLHDL, LDLDIRECT in the last 72 hours. Thyroid Function Tests: No results for input(s): TSH, T4TOTAL,  FREET4, T3FREE, THYROIDAB in the last 72 hours. Anemia Panel: No results for input(s): VITAMINB12, FOLATE, FERRITIN, TIBC, IRON, RETICCTPCT in the last 72 hours. Urine analysis:    Component Value Date/Time   COLORURINE YELLOW 06/07/2020 0041   APPEARANCEUR CLEAR 06/07/2020 0041   LABSPEC 1.008 06/07/2020 0041   PHURINE 5.0 06/07/2020 0041   GLUCOSEU NEGATIVE 06/07/2020 0041   GLUCOSEU NEGATIVE 09/04/2009 0721   HGBUR NEGATIVE 06/07/2020 0041   BILIRUBINUR NEGATIVE 06/07/2020 0041   BILIRUBINUR negative 09/24/2019 1003   KETONESUR NEGATIVE 06/07/2020 0041   PROTEINUR 100 (A) 06/07/2020 0041   UROBILINOGEN 0.2 09/24/2019 1003   UROBILINOGEN 0.2 09/04/2009 0721   NITRITE NEGATIVE 06/07/2020 0041   LEUKOCYTESUR NEGATIVE 06/07/2020 0041  Sepsis Labs: @LABRCNTIP (procalcitonin:4,lacticidven:4) ) Recent Results (from the past 240 hour(s))  Resp Panel by RT-PCR (Flu A&B, Covid) Nasopharyngeal Swab     Status: None   Collection Time: 06/03/20 12:48 AM   Specimen: Nasopharyngeal Swab; Nasopharyngeal(NP) swabs in vial transport medium  Result Value Ref Range Status   SARS Coronavirus 2 by RT PCR NEGATIVE NEGATIVE Final    Comment: (NOTE) SARS-CoV-2 target nucleic acids are NOT DETECTED.  The SARS-CoV-2 RNA is generally detectable in upper respiratory specimens during the acute phase of infection. The lowest concentration of SARS-CoV-2 viral copies this assay can detect is 138 copies/mL. A negative result does not preclude SARS-Cov-2 infection and should not be used as the sole basis for treatment or other patient management decisions. A negative result may occur with  improper specimen collection/handling, submission of specimen other than nasopharyngeal swab, presence of viral mutation(s) within the areas targeted by this assay, and inadequate number of viral copies(<138 copies/mL). A negative result must be combined with clinical observations, patient history, and  epidemiological information. The expected result is Negative.  Fact Sheet for Patients:  EntrepreneurPulse.com.au  Fact Sheet for Healthcare Providers:  IncredibleEmployment.be  This test is no t yet approved or cleared by the [place] FDA and  has been authorized for detection and/or diagnosis of SARS-CoV-2 by FDA under an Emergency Use Authorization (EUA). This EUA will remain  in effect (meaning this test can be used) for the duration of the COVID-19 declaration under Section 564(b)(1) of the Act, 21 U.S.C.section 360bbb-3(b)(1), unless the authorization is terminated  or revoked sooner.       Influenza A by PCR NEGATIVE NEGATIVE Final   Influenza B by PCR NEGATIVE NEGATIVE Final    Comment: (NOTE) The Xpert Xpress SARS-CoV-2/FLU/RSV plus assay is intended as an aid in the diagnosis of influenza from Nasopharyngeal swab specimens and should not be used as a sole basis for treatment. Nasal washings and aspirates are unacceptable for Xpert Xpress SARS-CoV-2/FLU/RSV testing.  Fact Sheet for Patients: EntrepreneurPulse.com.au  Fact Sheet for Healthcare Providers: IncredibleEmployment.be  This test is not yet approved or cleared by the [place] FDA and has been authorized for detection and/or diagnosis of SARS-CoV-2 by FDA under an Emergency Use Authorization (EUA). This EUA will remain in effect (meaning this test can be used) for the duration of the COVID-19 declaration under Section 564(b)(1) of the Act, 21 U.S.C. section 360bbb-3(b)(1), unless the authorization is terminated or revoked.  Performed at [facility], [place] [address], [place], [place] [address]          Radiology Studies: DG Chest 2 View  Result Date: 06/05/2020 CLINICAL DATA:  Shortness of breath. EXAM: CHEST - 2 VIEW COMPARISON:  Chest x-rays dated 06/02/2020 and and 09/24/2019. FINDINGS: Prominent interstitial  markings and/or ground-glass opacities are again seen throughout the lower lobes bilaterally, stable to slightly increased compared to most recent chest x-ray of 06/02/2020, new compared to the earlier study of 09/24/2019. Stable cardiomegaly. No pleural effusion or pneumothorax is seen. Osseous structures about the chest are unremarkable. IMPRESSION: Probable bibasilar pneumonia. COVID pneumonia can have this appearance. Alternatively, if afebrile, this could represent CHF with asymmetric pulmonary edema pattern. Electronically Signed   By: Franki Cabot M.D.   On: 06/05/2020 11:54   US RENAL  Result Date: 06/06/2020 CLINICAL DATA:  Acute on chronic kidney disease EXAM: RENAL / URINARY TRACT ULTRASOUND COMPLETE COMPARISON:  CT abdomen pelvis 05/08/2020 FINDINGS: Right Kidney: Renal measurements: 10.4 x 4.6 x  5.5 cm = volume: 138 mL. Echogenicity is increased. No mass or hydronephrosis visualized. Left Kidney: Renal measurements: 9.5 x 5.7 x 4.5 cm = volume: 153 mL. Echogenicity is increased. No hydronephrosis. There is a subcentimeter cyst in the lateral aspect of the kidney. Bladder: Appears normal for degree of bladder distention. Other: Incidentally noted small right pleural effusion. IMPRESSION: 1.  No acute finding sonographically in the bilateral kidneys. 2. Increased echogenicity of the bilateral kidneys as can be seen in medical renal disease. Subcentimeter cyst in the left kidney. Electronically Signed   By: Audie Pinto M.D.   On: 06/06/2020 16:35      Scheduled Meds: . allopurinol  100 mg Oral Daily  . amLODipine  10 mg Oral Daily  . atorvastatin  80 mg Oral Daily  . bromocriptine  2.5 mg Oral Daily  . enoxaparin (LOVENOX) injection  30 mg Subcutaneous Daily  . furosemide  160 mg Intravenous Q8H  . hydrALAZINE  50 mg Oral Q8H  . insulin aspart  0-9 Units Subcutaneous TID AC & HS  . isosorbide mononitrate  30 mg Oral Daily  . metolazone  5 mg Oral Daily  . metoprolol succinate   100 mg Oral Daily  . minoxidil  10 mg Oral BID  . pantoprazole  40 mg Oral Daily  . polyethylene glycol  17 g Oral Daily  . sodium chloride flush  3 mL Intravenous Q12H   Continuous Infusions: . sodium chloride       LOS: 3 days      Debbe Odea, MD [facility] Pager: [email or web address] 06/07/2020, 10:31 AM

## 2020-06-07 NOTE — Progress Notes (Signed)
[facility] Progress Note  Subjective: 800 cc UOP yest and 1.1 L out today so far. Creat down today and off O2. Feeling better.   Vitals:   06/06/20 1632 06/06/20 1923 06/07/20 0343 06/07/20 1110  BP: (!) 154/54 (!) 135/42 (!) 150/40 (!) 135/51  Pulse: 68 71 79 72  Resp: $Remo'20 18 19 18  'mxIIV$ Temp: 98.3 F (36.8 C) 98.4 F (36.9 C) 99.1 F (37.3 C) 99.1 F (37.3 C)  TempSrc: Oral Oral Oral Oral  SpO2: 92% 91% 90% 99%  Weight:   58.5 kg   Height:        Exam: Gen alert, no distress, off of O2 Chest - cta today RRR no MRG Abd soft ntnd no mass or ascites +bs GU normal MS no joint effusions or deformity Ext no LE edema Neuro is alert, Ox 3 , nf   Home meds:  - norvasc 10 qd/ clonidine 0.2 tid/ lasix 80 qd/ metoprolol xl 100 qd/ minoxidil 10 bid  - repaglinide $RemoveBefor'2mg'twDmPOacIRGw$  bid/ precose 12.5 tid ac   - protonix 40 qd/ parlodel 2.5 qd  - asa 325 qd/ lipitor 80 qd/ allopurinol 100 qd  - prn's/ vitamins/ supplements    Date                                       Creat               eGFR   2011- 2014                            0.67- 0.80   2015- 2018                            1.41- 1.88           2019                                      2.03- 2.62   2020                                      2.28   Aug - oct 2021                      2.09- 2.70   Nov 2021                               2.76- 3.98        12- 18, stage V              Dec 2021                               2.89   Jan 2022                               2.87                 17, stage IV   May 01, 2020                          3.51                 13, stage V                     April 15                                  3.62                 13, stage V   April 25- Jun 06, 2020         3.78- 4.67        Current admit                               CXR 5/13 - IMPRESSION: Probable bibasilar pneumonia. COVID pneumonia can have this appearance. Alternatively, if afebrile, this could represent CHF with asymmetric  pulmonary edema pattern.    No fever or ^wbc    BP's normal to high since admit, diast BP's are lower though    HR 55- 70, RR 12-20, afeb   [place] 2 L 92 %     I/O since admission = 1.6 L in and 5.7 L out = net - 4.0 L    Wt's stable at 60kg today (61kg on admit 5/10)     Renal US 5/14 - 10.4/ 9.5 cm kidneys w/o hydro, normal echo     UA 100 prot, o/w negative     UNa 100 , UCr 59    Assessment/ Plan: 1. AKI on CKD IV - b/l creat 2.8 from Jan 2022. Pt admitted w/ CHF exacerbation / pulm edema. Has diuresed here per I/O's however still has rales and O2 requirement, and repeat CXR no better and wt's aren't down significantly. Creat up 3.4 on admit and up to 4.6 yest. BP's wnl. Renal US w/o obstruction, urine lytes c/w diuresis.  AKI here due to decomp CHF, improving w/ high-dose IV lasix and added zaroxolyn. Creat down 4.2 today. O2 decided, lungs sound better. Will continue diuretics another 24h or so. Will follow.  2. A/C diast CHF - as above 3. HTN - BP's okay, on meds. 4. Mild Cleora Fleet - seen by cardiology, not c/w ACS.      Rob Koua Deeg 06/07/2020, 4:05 PM   Recent Labs  Lab 06/05/20 0738 06/06/20 0302 06/06/20 0653 06/07/20 0326  K  --  3.9  --  4.2  BUN  --  76*  --  82*  CREATININE  --  4.67*  --  4.29*  CALCIUM  --  8.6*  --  8.9  HGB 7.9*  --  9.1*  --    Inpatient medications: . allopurinol  100 mg Oral Daily  . amLODipine  10 mg Oral Daily  . atorvastatin  80 mg Oral Daily  . bromocriptine  2.5 mg Oral Daily  . enoxaparin (LOVENOX) injection  30 mg Subcutaneous Daily  . furosemide  160 mg Intravenous Q8H  . hydrALAZINE  50 mg Oral Q8H  . insulin aspart  0-9 Units Subcutaneous TID AC & HS  . isosorbide mononitrate  30 mg Oral Daily  . metolazone  5 mg Oral Daily  . metoprolol succinate  100 mg Oral Daily  . minoxidil  10 mg Oral BID  . pantoprazole  40 mg Oral Daily  . polyethylene glycol  17 g Oral Daily  . sodium chloride flush  3 mL Intravenous Q12H   .  sodium chloride     sodium chloride, acetaminophen, ondansetron (ZOFRAN) IV, sodium chloride flush

## 2020-06-08 DIAGNOSIS — I5033 Acute on chronic diastolic (congestive) heart failure: Secondary | ICD-10-CM | POA: Diagnosis not present

## 2020-06-08 LAB — BASIC METABOLIC PANEL
Anion gap: 13 (ref 5–15)
BUN: 84 mg/dL — ABNORMAL HIGH (ref 8–23)
CO2: 27 mmol/L (ref 22–32)
Calcium: 8.7 mg/dL — ABNORMAL LOW (ref 8.9–10.3)
Chloride: 97 mmol/L — ABNORMAL LOW (ref 98–111)
Creatinine, Ser: 4.21 mg/dL — ABNORMAL HIGH (ref 0.44–1.00)
GFR, Estimated: 11 mL/min — ABNORMAL LOW (ref >60)
Glucose, Bld: 176 mg/dL — ABNORMAL HIGH (ref 70–99)
Potassium: 3.8 mmol/L (ref 3.5–5.1)
Sodium: 137 mmol/L (ref 135–145)

## 2020-06-08 LAB — CBC
HCT: 26.3 % — ABNORMAL LOW (ref 36.0–46.0)
Hemoglobin: 8.6 g/dL — ABNORMAL LOW (ref 12.0–15.0)
MCH: 30.8 pg (ref 26.0–34.0)
MCHC: 32.7 g/dL (ref 30.0–36.0)
MCV: 94.3 fL (ref 80.0–100.0)
Platelets: 239 10*3/uL (ref 150–400)
RBC: 2.79 MIL/uL — ABNORMAL LOW (ref 3.87–5.11)
RDW: 16.7 % — ABNORMAL HIGH (ref 11.5–15.5)
WBC: 6.6 10*3/uL (ref 4.0–10.5)
nRBC: 0 % (ref 0.0–0.2)

## 2020-06-08 LAB — GLUCOSE, CAPILLARY
Glucose-Capillary: 149 mg/dL — ABNORMAL HIGH (ref 70–99)
Glucose-Capillary: 154 mg/dL — ABNORMAL HIGH (ref 70–99)
Glucose-Capillary: 178 mg/dL — ABNORMAL HIGH (ref 70–99)
Glucose-Capillary: 263 mg/dL — ABNORMAL HIGH (ref 70–99)

## 2020-06-08 LAB — FERRITIN: Ferritin: 290 ng/mL (ref 11–307)

## 2020-06-08 LAB — IRON AND TIBC
Iron: 36 ug/dL (ref 28–170)
Saturation Ratios: 16 % (ref 10.4–31.8)
TIBC: 223 ug/dL — ABNORMAL LOW (ref 250–450)
UIBC: 187 ug/dL

## 2020-06-08 MED ORDER — TORSEMIDE 20 MG PO TABS
80.0000 mg | ORAL_TABLET | Freq: Two times a day (BID) | ORAL | Status: DC
  Administered 2020-06-08 – 2020-06-10 (×4): 80 mg via ORAL
  Filled 2020-06-08 (×4): qty 4

## 2020-06-08 MED ORDER — FUROSEMIDE 10 MG/ML IJ SOLN
160.0000 mg | Freq: Three times a day (TID) | INTRAMUSCULAR | Status: AC
  Administered 2020-06-08: 160 mg via INTRAVENOUS
  Filled 2020-06-08: qty 16

## 2020-06-08 MED ORDER — HYDRALAZINE HCL 25 MG PO TABS
25.0000 mg | ORAL_TABLET | Freq: Once | ORAL | Status: AC
  Administered 2020-06-08: 25 mg via ORAL
  Filled 2020-06-08: qty 1

## 2020-06-08 MED ORDER — SODIUM CHLORIDE 0.9 % IV SOLN
250.0000 mg | Freq: Every day | INTRAVENOUS | Status: DC
  Administered 2020-06-08 – 2020-06-10 (×3): 250 mg via INTRAVENOUS
  Filled 2020-06-08 (×3): qty 20

## 2020-06-08 NOTE — Progress Notes (Signed)
MD notified of patients blood pressure of 146/49 HR 77. MD ordered to hold hydralazine and to give 80mg  of lasix now and admin the other 80mg  if b/p elevates. Will pass off to on coming shift.

## 2020-06-08 NOTE — Progress Notes (Signed)
Nephrology Follow-Up Consult note   Assessment/Recommendations: [name] is a/an 70 y.o. female with a past medical history significant for HTN, HLD, CVA, DM 2, CKD, CHF, admitted for CHF exacerbation.       Non-Oliguric AKI on CKD 4: Likely secondary to cardiorenal syndrome.  Slowly improving with diuresis.  Volume status has improved and urine output increasing with aggressive diuresis -IV Lasix 160 twice daily with metolazone 5 mg today -Transition to torsemide 80 mg twice daily tomorrow and use metolazone if needed -Hypertension CHF management as below -Continue to monitor daily Cr, Dose meds for GFR -Monitor Daily I/Os, Daily weight  -Maintain MAP>65 for optimal renal perfusion.  -Avoid nephrotoxic medications including NSAIDs and Vanc/Zosyn combo  CHF exacerbation: Likely hypertensive heart disease with decreased renal function at baseline and can volume status difficult.  Cardiology following.  Some concern for cardiac amyloid with plans to work-up outpatient.   Hypertension: Must be severe and difficult to control hypertension on hydralazine and minoxidil.  Can maintain current medications but one should monitor closely for pericardial effusion when taking minoxidil especially in combination with hydralazine.  Anemia: likely multifactorial. On iron and epo as an outpatient. Check iron stores, receiving retacrit monthly, may need a dose of esa here.  Uncontrolled Diabetes Mellitus Type 2 with Hyperglycemia: mgmt per primary  Recommendations conveyed to primary service.    [facility] 06/08/2020 10:43 AM  ___________________________________________________________  CC: AKI on CKD  Interval History/Subjective: Patient states she is feeling better today.  Feels like her shortness of breath is improving.   Medications:  Current Facility-Administered Medications  Medication Dose Route Frequency Provider Last Rate Last Admin  . 0.9 %   sodium chloride infusion  250 mL Intravenous PRN Shalhoub, Sherryll Burger, MD      . acetaminophen (TYLENOL) tablet 650 mg  650 mg Oral Q4H PRN Shalhoub, Sherryll Burger, MD      . allopurinol (ZYLOPRIM) tablet 100 mg  100 mg Oral Daily Shalhoub, Sherryll Burger, MD   100 mg at 06/08/20 1042  . amLODipine (NORVASC) tablet 10 mg  10 mg Oral Daily Shalhoub, Sherryll Burger, MD   10 mg at 06/08/20 1042  . atorvastatin (LIPITOR) tablet 80 mg  80 mg Oral Daily Shalhoub, Sherryll Burger, MD   80 mg at 06/08/20 1042  . bromocriptine (PARLODEL) tablet 2.5 mg  2.5 mg Oral Daily Shalhoub, Sherryll Burger, MD   2.5 mg at 06/07/20 0947  . enoxaparin (LOVENOX) injection 30 mg  30 mg Subcutaneous Daily Shalhoub, Sherryll Burger, MD   30 mg at 06/08/20 1041  . furosemide (LASIX) injection 160 mg  160 mg Intravenous Q8H Roney Jaffe, MD   80 mg at 06/08/20 0651  . hydrALAZINE (APRESOLINE) tablet 50 mg  50 mg Oral Q8H Debbe Odea, MD   50 mg at 06/07/20 2143  . insulin aspart (novoLOG) injection 0-9 Units  0-9 Units Subcutaneous TID AC & HS Shalhoub, Sherryll Burger, MD   1 Units at 06/08/20 [phone]  . isosorbide mononitrate (IMDUR) 24 hr tablet 30 mg  30 mg Oral Daily Debbe Odea, MD   30 mg at 06/08/20 1041  . metolazone (ZAROXOLYN) tablet 5 mg  5 mg Oral Daily Roney Jaffe, MD   5 mg at 06/08/20 1042  . metoprolol succinate (TOPROL-XL) 24 hr tablet 100 mg  100 mg Oral Daily Shalhoub, Sherryll Burger, MD   100 mg at 06/08/20 1041  . minoxidil (LONITEN) tablet 10 mg  10 mg  Oral BID Vernelle Emerald, MD   10 mg at 06/07/20 2144  . ondansetron (ZOFRAN) injection 4 mg  4 mg Intravenous Q6H PRN Shalhoub, Sherryll Burger, MD   4 mg at 06/04/20 1401  . pantoprazole (PROTONIX) EC tablet 40 mg  40 mg Oral Daily Shalhoub, Sherryll Burger, MD   40 mg at 06/08/20 1041  . polyethylene glycol (MIRALAX / GLYCOLAX) packet 17 g  17 g Oral Daily Debbe Odea, MD   17 g at 06/08/20 1042  . sodium chloride flush (NS) 0.9 % injection 3 mL  3 mL Intravenous Q12H Shalhoub, Sherryll Burger, MD   3 mL at  06/07/20 2145  . sodium chloride flush (NS) 0.9 % injection 3 mL  3 mL Intravenous PRN Shalhoub, Sherryll Burger, MD          Review of Systems: 10 systems reviewed and negative except per interval history/subjective  Physical Exam: Vitals:   06/08/20 0607 06/08/20 1025  BP: (!) 146/49 (!) 165/63  Pulse: 77 82  Resp:  20  Temp:  99.6 F (37.6 C)  SpO2: 96% 91%   Total I/O In: 360 [P.O.:360] Out: 300 [Urine:300]  Intake/Output Summary (Last 24 hours) at 06/08/2020 1043 Last data filed at 06/08/2020 0850 Gross per 24 hour  Intake 1020 ml  Output 1750 ml  Net -730 ml   Constitutional: well-appearing, no acute distress ENMT: ears and nose without scars or lesions, MMM CV: normal rate, no edema, systolic murmur present Respiratory: clear to auscultation, normal work of breathing Gastrointestinal: soft, non-tender, no palpable masses or hernias Skin: no visible lesions or rashes Psych: alert, judgement/insight appropriate, appropriate mood and affect   Test Results I personally reviewed new and old clinical labs and radiology tests Lab Results  Component Value Date   NA 137 06/08/2020   K 3.8 06/08/2020   CL 97 (L) 06/08/2020   CO2 27 06/08/2020   BUN 84 (H) 06/08/2020   CREATININE 4.21 (H) 06/08/2020   GFR 25.74 (L) 07/11/2018   CALCIUM 8.7 (L) 06/08/2020   ALBUMIN 3.2 (L) 06/02/2020   PHOS 6.6 (H) 05/21/2020

## 2020-06-08 NOTE — Progress Notes (Signed)
Cardiology Progress Note  Patient ID: [name] MRN: [id number] DOB: [birth year] Date of Encounter: 06/08/2020  Primary Cardiologist: None  Subjective   Chief Complaint: None.  HPI: -1 L overnight.  Net -5.5 L since admission.  Breathing better.  Close to euvolemia.  ROS:  All other ROS reviewed and negative. Pertinent positives noted in the HPI.     Inpatient Medications  Scheduled Meds: . allopurinol  100 mg Oral Daily  . amLODipine  10 mg Oral Daily  . atorvastatin  80 mg Oral Daily  . bromocriptine  2.5 mg Oral Daily  . enoxaparin (LOVENOX) injection  30 mg Subcutaneous Daily  . furosemide  160 mg Intravenous Q8H  . hydrALAZINE  50 mg Oral Q8H  . insulin aspart  0-9 Units Subcutaneous TID AC & HS  . isosorbide mononitrate  30 mg Oral Daily  . metolazone  5 mg Oral Daily  . metoprolol succinate  100 mg Oral Daily  . minoxidil  10 mg Oral BID  . pantoprazole  40 mg Oral Daily  . polyethylene glycol  17 g Oral Daily  . sodium chloride flush  3 mL Intravenous Q12H   Continuous Infusions: . sodium chloride     PRN Meds: sodium chloride, acetaminophen, ondansetron (ZOFRAN) IV, sodium chloride flush   Vital Signs   Vitals:   06/07/20 2142 06/08/20 0100 06/08/20 0546 06/08/20 0607  BP: (!) 163/53  (!) 153/42 (!) 146/49  Pulse: 75  76 77  Resp:   18   Temp:   97.9 F (36.6 C)   TempSrc:      SpO2: 92%  98% 96%  Weight:  57.2 kg    Height:        Intake/Output Summary (Last 24 hours) at 06/08/2020 0844 Last data filed at 06/08/2020 0828 Gross per 24 hour  Intake 1020 ml  Output 1650 ml  Net -630 ml   Last 3 Weights 06/08/2020 06/07/2020 06/06/2020  Weight (lbs) 126 lb 128 lb 15.5 oz 132 lb 9.6 oz  Weight (kg) 57.153 kg 58.5 kg 60.147 kg      Telemetry  Overnight telemetry shows sinus rhythm in the 70s, which I personally reviewed.    Physical Exam   Vitals:   06/07/20 2142 06/08/20 0100 06/08/20 0546 06/08/20 0607  BP: (!) 163/53  (!) 153/42 (!)  146/49  Pulse: 75  76 77  Resp:   18   Temp:   97.9 F (36.6 C)   TempSrc:      SpO2: 92%  98% 96%  Weight:  57.2 kg    Height:         Intake/Output Summary (Last 24 hours) at 06/08/2020 0844 Last data filed at 06/08/2020 0828 Gross per 24 hour  Intake 1020 ml  Output 1650 ml  Net -630 ml    Last 3 Weights 06/08/2020 06/07/2020 06/06/2020  Weight (lbs) 126 lb 128 lb 15.5 oz 132 lb 9.6 oz  Weight (kg) 57.153 kg 58.5 kg 60.147 kg    Body mass index is 23.05 kg/m.  General: Well nourished, well developed, in no acute distress Head: Atraumatic, normal size  Eyes: PEERLA, EOMI  Neck: Supple, no JVD Endocrine: No thryomegaly Cardiac: Normal S1, S2; RRR; 2 out of 6 systolic ejection murmur Lungs: Clear to auscultation bilaterally, no wheezing, rhonchi or rales  Abd: Soft, nontender, no hepatomegaly  Ext: No edema, pulses 2+ Musculoskeletal: No deformities, BUE and BLE strength normal and equal Skin: Warm and dry, no rashes  Neuro: Alert and oriented to person, place, time, and situation, CNII-XII grossly intact, no focal deficits  Psych: Normal mood and affect   Labs  High Sensitivity Troponin:   Recent Labs  Lab 06/03/20 0044 06/03/20 0230  TROPONINIHS 20* 26*     Cardiac EnzymesNo results for input(s): TROPONINI in the last 168 hours. No results for input(s): TROPIPOC in the last 168 hours.  Chemistry Recent Labs  Lab 06/02/20 2321 06/04/20 0356 06/05/20 0342 06/06/20 0302 06/07/20 0326  NA 135   < > 139 136 136  K 3.8   < > 4.0 3.9 4.2  CL 101   < > 103 101 98  CO2 24   < > 26 23 23   GLUCOSE 117*   < > 95 113* 173*  BUN 72*   < > 76* 76* 82*  CREATININE 3.47*   < > 4.06* 4.67* 4.29*  CALCIUM 8.6*   < > 8.5* 8.6* 8.9  PROT 6.8  --   --   --   --   ALBUMIN 3.2*  --   --   --   --   AST 26  --   --   --   --   ALT 44  --   --   --   --   ALKPHOS 108  --   --   --   --   BILITOT 1.9*  --   --   --   --   GFRNONAA 14*   < > 11* 10* 11*  ANIONGAP 10   < > 10 12  15    < > = values in this interval not displayed.    Hematology Recent Labs  Lab 06/03/20 0821 06/05/20 0738 06/06/20 0653  WBC 6.9 6.4 7.2  RBC 2.58* 2.45* 2.87*  HGB 8.1* 7.9* 9.1*  HCT 24.8* 23.7* 27.9*  MCV 96.1 96.7 97.2  MCH 31.4 32.2 31.7  MCHC 32.7 33.3 32.6  RDW 15.6* 16.1* 16.3*  PLT 274 278 289   BNP Recent Labs  Lab 06/03/20 0044 06/05/20 1217  BNP 669.8* 643.2*    DDimer No results for input(s): DDIMER in the last 168 hours.   Radiology  US RENAL  Result Date: 06/06/2020 CLINICAL DATA:  Acute on chronic kidney disease EXAM: RENAL / URINARY TRACT ULTRASOUND COMPLETE COMPARISON:  CT abdomen pelvis 05/08/2020 FINDINGS: Right Kidney: Renal measurements: 10.4 x 4.6 x 5.5 cm = volume: 138 mL. Echogenicity is increased. No mass or hydronephrosis visualized. Left Kidney: Renal measurements: 9.5 x 5.7 x 4.5 cm = volume: 153 mL. Echogenicity is increased. No hydronephrosis. There is a subcentimeter cyst in the lateral aspect of the kidney. Bladder: Appears normal for degree of bladder distention. Other: Incidentally noted small right pleural effusion. IMPRESSION: 1.  No acute finding sonographically in the bilateral kidneys. 2. Increased echogenicity of the bilateral kidneys as can be seen in medical renal disease. Subcentimeter cyst in the left kidney. Electronically Signed   By: Audie Pinto M.D.   On: 06/06/2020 16:35    Cardiac Studies  TTE 06/03/2020 1. Left ventricular ejection fraction, by estimation, is 70 to 75%. The  left ventricle has hyperdynamic function. The left ventricle has no  regional wall motion abnormalities. There is severe left ventricular  hypertrophy. Left ventricular diastolic  parameters are consistent with Grade II diastolic dysfunction  (pseudonormalization). Elevated left atrial pressure.  2. Right ventricular systolic function is normal. The right ventricular  size is normal. Tricuspid regurgitation signal is  inadequate for assessing   PA pressure.  3. Left atrial size was severely dilated.  4. The mitral valve is normal in structure. Trivial mitral valve  regurgitation.  5. The aortic valve is tricuspid. Aortic valve regurgitation is trivial.  Mild aortic valve stenosis. Vmax 2.5 m/s, MG 15 mmHg, AVA 1.5 cm^2, DI  0.65  6. 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 Recommend PYP scan to evaluate for TTR amyloid and  SPEP/UPEP/light chains to evaluate for AL amyloid   Patient Profile  [name] is a 70 y.o. female with hypertension, hyperlipidemia, CKD stage IV, diabetes, stroke who was admitted on 06/02/2020 with acute decompensated diastolic heart failure.  Assessment & Plan   1.  Acute on chronic diastolic heart failure -Euvolemic in my opinion.  Nephrology is following given significant CKD.  I will defer diuretics to them. -On my review of her echocardiogram this appears to be hypertensive heart disease.  She has near normal tissue Doppler and a predominant filling of the mitral valve inflow.  She also has significant CKD.  I have a low suspicion for cardiac amyloid.  Nonetheless, she can have this work-up done with Dr. Debara Pickett as an outpatient. -Would recommend to continue antihypertensives as you are doing.  2.  Minimally elevated troponin -Demand in the setting of diastolic heart failure and hypertensive crisis.  3.  Mild aortic stenosis -Routine follow-up as an outpatient.  [facility] will sign off.   Medication Recommendations: Continue antihypertensives.  Diuretics per renal. Other recommendations (labs, testing, etc): None. Follow up as an outpatient: We will arrange hospital follow-up with Dr. Debara Pickett in 4 to 6 weeks.  He can pursue amyloid work-up if he believes this is necessary.  For questions or updates, please contact [place] Please consult [email or web address] for contact info under   Time Spent with Patient: I have spent a total of 15 minutes with  patient reviewing hospital notes, telemetry, EKGs, labs and examining the patient as well as establishing an assessment and plan that was discussed with the patient.  > 50% of time was spent in direct patient care.    Signed, Addison Naegeli. Audie Box, MD, [place]  06/08/2020 8:44 AM

## 2020-06-08 NOTE — Progress Notes (Signed)
SATURATION QUALIFICATIONS: (This note is used to comply with regulatory documentation for home oxygen)  Patient Saturations on Room Air at Rest = 92%  Patient Saturations on Room Air while Ambulating = 86%  Patient Saturations on 3 Liters of oxygen at rest = 100%  While ambulating patient saturation drop to 86% but she rebounded to 90% after a few rest.

## 2020-06-08 NOTE — Progress Notes (Signed)
PROGRESS NOTE    [name]   RJJ:[id number]  DOB: 06/27/1950  DOA: 06/02/2020 PCP: Erby Pian, PA-C   Brief Narrative:  [name] 70 year old female with past medical history of hyperlipidemia, gastroesophageal reflux disease, diabetes mellitus type 2, chronic kidney disease stage IV, hypertension and recent gastrointestinal bleed (normal EGD 4/27 however) who was just discharged from [facility] on 4/29 presenting with shortness of breath. Diagnosed with acute CHF in the ED and admitted for diuresis.    Subjective: No complaints. 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  - cardiology recommends outpatient PYP scan to evaluate for TTR amyloid and  SPEP/UPEP/light chains to evaluate for AL amyloid - CXR 5/13 > persistent b/l infiltrates- appear to be worsening  - BNP 669.8>> 643.2 and thus not much improved - 5/14 - pulse ox dropped to 80s on room air - no leukocytosis- max temp 99.6 on 5/12- low suspicion for infection - Nephrology assisting with diuresis - Cr improving- no need for dialysis yet per Nephro- appreciate assistance - cont IV diuretics today    Active Problems:   Hypertensive urgency - BPs 211/66, 185/68 around the time of admission - on multiple meds including amlodipine, Clonidine, Toprol - added Hydralazine and Imdur-  D/c'd Clonidine this admission     Type 2 diabetes mellitus  - A1c 6.2 on 5/11 - dc'd Prandin and Acarbose for now - cont SSI   - follows with Dr Loanne Drilling     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  - significantly improved today - sister is at bedside  Note: Bromocriptine on med list- patient does not know why she is on this    Time spent in minutes: 35 DVT prophylaxis: enoxaparin (LOVENOX) injection 30 mg Start: 06/03/20 1400 Code Status: Full code Family Communication:  Level of Care: Level of care: Telemetry Medical Disposition Plan:  Status is: Inpatient  Remains inpatient appropriate because:IV treatments appropriate due to intensity of illness or inability to take PO   Dispo: The patient is from: Home              Anticipated d/c is to: Home              Patient currently is not medically stable to d/c.   Difficult to place patient No   Consultants:   Cardiology  Nephrology Procedures:    Antimicrobials:  Anti-infectives (From admission, onward)   None       Objective: Vitals:   06/08/20 0100 06/08/20 0546 06/08/20 0607 06/08/20 1025  BP:  (!) 153/42 (!) 146/49 (!) 165/63  Pulse:  76 77 82  Resp:  18  20  Temp:  97.9 F (36.6  C)  99.6 F (37.6 C)  TempSrc:    Oral  SpO2:  98% 96% 91%  Weight: 57.2 kg     Height:        Intake/Output Summary (Last 24 hours) at 06/08/2020 1159 Last data filed at 06/08/2020 1100 Gross per 24 hour  Intake 1023 ml  Output 2050 ml  Net -1027 ml   Filed Weights   06/06/20 0811 06/07/20 0343 06/08/20 0100  Weight: 60.1 kg 58.5 kg 57.2 kg    Examination: General exam: Appears comfortable  HEENT: PERRLA, oral mucosa moist, no sclera icterus or thrush Respiratory system: Clear to auscultation. Respiratory effort normal. Cardiovascular system: S1 & S2 heard, regular rate and  rhythm Gastrointestinal system: Abdomen soft, non-tender, nondistended. Normal bowel sounds   Central nervous system: Alert and oriented. No focal neurological deficits. Extremities: No cyanosis, clubbing or edema Skin: No rashes or ulcers Psychiatry:  Mood & affect appropriate.   Data Reviewed: I have personally reviewed following labs and imaging studies  CBC: Recent Labs  Lab 06/02/20 2321 06/03/20 0821 06/05/20 0738 06/06/20 0653 06/08/20 0834  WBC 8.3 6.9 6.4 7.2 6.6  NEUTROABS 5.7 4.5  --   --   --   HGB 8.7* 8.1* 7.9* 9.1* 8.6*  HCT 27.1* 24.8* 23.7* 27.9* 26.3*  MCV 95.1 96.1 96.7 97.2 94.3  PLT 286 274 278 289 324   Basic Metabolic Panel: Recent Labs  Lab 06/04/20 0356 06/05/20 0342 06/06/20 0302 06/07/20 0326 06/08/20 0834  NA 138 139 136 136 137  K 3.9 4.0 3.9 4.2 3.8  CL 102 103 101 98 97*  CO2 26 26 23 23 27   GLUCOSE 167* 95 113* 173* 176*  BUN 76* 76* 76* 82* 84*  CREATININE 3.40* 4.06* 4.67* 4.29* 4.21*  CALCIUM 8.5* 8.5* 8.6* 8.9 8.7*   GFR: Estimated Creatinine Clearance: 9.8 mL/min (A) (by C-G formula based on SCr of 4.21 mg/dL (H)). Liver Function Tests: Recent Labs  Lab 06/02/20 2321  AST 26  ALT 44  ALKPHOS 108  BILITOT 1.9*  PROT 6.8  ALBUMIN 3.2*   No results for input(s): LIPASE, AMYLASE in the last 168 hours. No results for input(s): AMMONIA in the last 168 hours. Coagulation Profile: No results for input(s): INR, PROTIME in the last 168 hours. Cardiac Enzymes: No results for input(s): CKTOTAL, CKMB, CKMBINDEX, TROPONINI in the last 168 hours. BNP (last 3 results) No results for input(s): PROBNP in the last 8760 hours. HbA1C: No results for input(s): HGBA1C in the last 72 hours. CBG: Recent Labs  Lab 06/07/20 1107 06/07/20 1611 06/07/20 2115 06/08/20 0607 06/08/20 1027  GLUCAP 187* 211* 182* 149* 154*   Lipid Profile: No results for input(s): CHOL, HDL, LDLCALC, TRIG, CHOLHDL, LDLDIRECT in the last 72 hours. Thyroid  Function Tests: No results for input(s): TSH, T4TOTAL, FREET4, T3FREE, THYROIDAB in the last 72 hours. Anemia Panel: No results for input(s): VITAMINB12, FOLATE, FERRITIN, TIBC, IRON, RETICCTPCT in the last 72 hours. Urine analysis:    Component Value Date/Time   COLORURINE YELLOW 06/07/2020 0041   APPEARANCEUR CLEAR 06/07/2020 0041   LABSPEC 1.008 06/07/2020 0041   PHURINE 5.0 06/07/2020 0041   GLUCOSEU NEGATIVE 06/07/2020 0041   GLUCOSEU NEGATIVE 09/04/2009 0721   HGBUR NEGATIVE 06/07/2020 0041   BILIRUBINUR NEGATIVE 06/07/2020 0041   BILIRUBINUR negative 09/24/2019 1003   KETONESUR NEGATIVE 06/07/2020 0041   PROTEINUR 100 (A) 06/07/2020 0041   UROBILINOGEN 0.2 09/24/2019 1003   UROBILINOGEN 0.2 09/04/2009 0721   NITRITE  NEGATIVE 06/07/2020 0041   LEUKOCYTESUR NEGATIVE 06/07/2020 0041   Sepsis Labs: @LABRCNTIP (procalcitonin:4,lacticidven:4) ) Recent Results (from the past 240 hour(s))  Resp Panel by RT-PCR (Flu A&B, Covid) Nasopharyngeal Swab     Status: None   Collection Time: 06/03/20 12:48 AM   Specimen: Nasopharyngeal Swab; Nasopharyngeal(NP) swabs in vial transport medium  Result Value Ref Range Status   SARS Coronavirus 2 by RT PCR NEGATIVE NEGATIVE Final    Comment: (NOTE) SARS-CoV-2 target nucleic acids are NOT DETECTED.  The SARS-CoV-2 RNA is generally detectable in upper respiratory specimens during the acute phase of infection. The lowest concentration of SARS-CoV-2 viral copies this assay can detect is 138 copies/mL. A negative result does not preclude SARS-Cov-2 infection and should not be used as the sole basis for treatment or other patient management decisions. A negative result may occur with  improper specimen collection/handling, submission of specimen other than nasopharyngeal swab, presence of viral mutation(s) within the areas targeted by this assay, and inadequate number of viral copies(<138 copies/mL). A negative result must be combined  with clinical observations, patient history, and epidemiological information. The expected result is Negative.  Fact Sheet for Patients:  EntrepreneurPulse.com.au  Fact Sheet for Healthcare Providers:  IncredibleEmployment.be  This test is no t yet approved or cleared by the [place] FDA and  has been authorized for detection and/or diagnosis of SARS-CoV-2 by FDA under an Emergency Use Authorization (EUA). This EUA will remain  in effect (meaning this test can be used) for the duration of the COVID-19 declaration under Section 564(b)(1) of the Act, 21 U.S.C.section 360bbb-3(b)(1), unless the authorization is terminated  or revoked sooner.       Influenza A by PCR NEGATIVE NEGATIVE Final   Influenza B by PCR NEGATIVE NEGATIVE Final    Comment: (NOTE) The Xpert Xpress SARS-CoV-2/FLU/RSV plus assay is intended as an aid in the diagnosis of influenza from Nasopharyngeal swab specimens and should not be used as a sole basis for treatment. Nasal washings and aspirates are unacceptable for Xpert Xpress SARS-CoV-2/FLU/RSV testing.  Fact Sheet for Patients: EntrepreneurPulse.com.au  Fact Sheet for Healthcare Providers: IncredibleEmployment.be  This test is not yet approved or cleared by the [place] FDA and has been authorized for detection and/or diagnosis of SARS-CoV-2 by FDA under an Emergency Use Authorization (EUA). This EUA will remain in effect (meaning this test can be used) for the duration of the COVID-19 declaration under Section 564(b)(1) of the Act, 21 U.S.C. section 360bbb-3(b)(1), unless the authorization is terminated or revoked.  Performed at [facility], [place] [address], [place], [place] [address]          Radiology Studies: US RENAL  Result Date: 06/06/2020 CLINICAL DATA:  Acute on chronic kidney disease EXAM: RENAL / URINARY TRACT ULTRASOUND COMPLETE COMPARISON:  CT  abdomen pelvis 05/08/2020 FINDINGS: Right Kidney: Renal measurements: 10.4 x 4.6 x 5.5 cm = volume: 138 mL. Echogenicity is increased. No mass or hydronephrosis visualized. Left Kidney: Renal measurements: 9.5 x 5.7 x 4.5 cm = volume: 153 mL. Echogenicity is increased. No hydronephrosis. There is a subcentimeter cyst in the lateral aspect of the kidney. Bladder: Appears normal for degree of bladder distention. Other: Incidentally noted small right pleural effusion. IMPRESSION: 1.  No acute finding sonographically in the bilateral kidneys. 2. Increased echogenicity of the bilateral kidneys as can be seen in medical renal disease. Subcentimeter cyst in the left kidney. Electronically Signed   By: Audie Pinto M.D.   On: 06/06/2020 16:35  Scheduled Meds: . allopurinol  100 mg Oral Daily  . amLODipine  10 mg Oral Daily  . atorvastatin  80 mg Oral Daily  . bromocriptine  2.5 mg Oral Daily  . enoxaparin (LOVENOX) injection  30 mg Subcutaneous Daily  . furosemide  160 mg Intravenous Q8H  . hydrALAZINE  50 mg Oral Q8H  . insulin aspart  0-9 Units Subcutaneous TID AC & HS  . isosorbide mononitrate  30 mg Oral Daily  . metoprolol succinate  100 mg Oral Daily  . minoxidil  10 mg Oral BID  . pantoprazole  40 mg Oral Daily  . polyethylene glycol  17 g Oral Daily  . sodium chloride flush  3 mL Intravenous Q12H  . torsemide  80 mg Oral BID   Continuous Infusions: . sodium chloride       LOS: 4 days      Debbe Odea, MD [facility] Pager: [email or web address] 06/08/2020, 11:59 AM

## 2020-06-08 NOTE — TOC Initial Note (Signed)
Transition of Care [facility]) - Initial/Assessment Note    Patient Details  Name: [name] MRN: [id number] Date of Birth: 12-18-1950  Transition [facility]) CM/SW Contact:    Zenon Mayo, RN Phone Number: 06/08/2020, 1:37 PM  Clinical Narrative:                 From home alone, but her sister, [name] stays one house down from her and can assist her when needed.  She states she has a walker /cane at home.  She still drives and [name] sometimes take her to her MD apts.  [facility] [facility] but would like to have [place] her meds prior to discharge. She states she consumes a no salt diet.  She is active with [facility] for HHPT/HHOT,will like to add HHRN.  NCM informed Ramond Marrow of this information and she states they can add a HHRN to services.   Expected Discharge Plan: [place] Barriers to Discharge: Continued Medical Work up   Patient Goals and CMS Choice Patient states their goals for this hospitalization and ongoing recovery are:: return home CMS Medicare.gov Compare Post Acute Care list provided to:: Patient Choice offered to / list presented to : Patient  Expected Discharge Plan and Services Expected Discharge Plan: [place] In-house Referral: NA Discharge Planning Services: CM Consult Post Acute Care Choice: Home Health,Resumption of Svcs/PTA Provider Living arrangements for the past 2 months: Single Family Home                   DME Agency: NA       HH Arranged: RN,Disease Management,PT,OT HH Agency: [place] ([place]) Date HH Agency Contacted: 06/08/20 Time HH Agency Contacted: 54 Representative spoke with at [place]: [place] Arrangements/Services Living arrangements for the past 2 months: [place] Lives with:: Self Patient language and need for interpreter reviewed:: Yes Do you feel safe going back to the place where you live?: Yes      Need for  Family Participation in Patient Care: Yes (Comment) Care giver support system in place?: Yes (comment) Current home services: DME (has walker and cane) Criminal Activity/Legal Involvement Pertinent to Current Situation/Hospitalization: No - Comment as needed  Activities of Daily Living Home Assistive Devices/Equipment: Cane (specify quad or straight),Walker (specify type),Dentures (specify type) ADL Screening (condition at time of admission) Patient's cognitive ability adequate to safely complete daily activities?: Yes Is the patient deaf or have difficulty hearing?: No Does the patient have difficulty seeing, even when wearing glasses/contacts?: No Does the patient have difficulty concentrating, remembering, or making decisions?: No Patient able to express need for assistance with ADLs?: Yes Does the patient have difficulty dressing or bathing?: No Independently performs ADLs?: Yes (appropriate for developmental age) Does the patient have difficulty walking or climbing stairs?: No Weakness of Legs: None Weakness of Arms/Hands: None  Permission Sought/Granted                  Emotional Assessment Appearance:: Appears stated age Attitude/Demeanor/Rapport: Engaged Affect (typically observed): Appropriate Orientation: : Oriented to Self,Oriented to Place,Oriented to  Time,Oriented to Situation Alcohol / Substance Use: Not Applicable Psych Involvement: No (comment)  Admission diagnosis:  Shortness of breath [R06.02] Acute congestive heart failure, unspecified heart failure type ([place]) [I50.9] CHF (congestive heart failure) ([place]) [I50.9] Patient Active Problem List   Diagnosis Date Noted  . CHF (congestive heart failure) ([place]) 06/04/2020  .  Shortness of breath 06/03/2020  . GERD without esophagitis 06/03/2020  . Elevated troponin level not due myocardial infarction 06/03/2020  . Heme positive stool   . GI bleed 05/18/2020  . CKD (chronic kidney disease) stage 3, GFR 30-59  ml/min (HCC) 05/18/2020  . Knee pain 05/18/2020  . Right flank discomfort 11/28/2019  . Vitamin D toxicity, accidental or unintentional, initial encounter 09/09/2017  . Ataxia 09/09/2017  . Type 2 diabetes mellitus with stage 4 chronic kidney disease, without long-term current use of insulin ([place]) 09/09/2017  . Chronic kidney disease (CKD)   . Olecranon bursitis of right elbow 03/09/2017  . Thyroid nodule 06/29/2016  . 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 Vitamin D deficiency 01/12/2016  . Medicare annual wellness visit, initial 01/11/2016  . Lacunar infarct, acute ([place]) 03/23/2015  . Non compliance w medication regimen 01/16/2015  . Anemia 01/08/2015  . Stroke ([place]) 01/08/2015  . Mixed diabetic hyperlipidemia associated with type 2 diabetes mellitus ([place])   . Carotid bruit present 12/13/2012  . Routine health maintenance 10/24/2010  . PERIPHERAL EDEMA 09/10/2009  . ELECTROCARDIOGRAM, ABNORMAL 04/11/2008  . HEART MURMUR, SYSTOLIC 69/48/5462  . SEBACEOUS CYST, NECK 12/13/2006  . Hyperthyroidism 09/27/2006  . Secondary diabetes with peripheral vascular disease ([place]) 09/27/2006  . Hyperlipidemia LDL goal <70 09/27/2006  . TOBACCO ABUSE 09/27/2006  . Hypertensive urgency 09/27/2006   PCP:  Erby Pian, PA-C Pharmacy[facility]   [id number] [place], [place] [address] [place] [address] Phone: [phone] Fax: [phone]  [place], [address], [address] [place], [place] [address] Phone: [phone] Fax: [phone]  [facility] [address] [place] [address] Phone: [phone] Fax: [phone]     Social Determinants of Health (SDOH) Interventions     Readmission Risk Interventions Readmission Risk Prevention Plan 06/08/2020 05/20/2020  Transportation Screening Complete Complete  PCP or Specialist Appt within 3-5 Days Complete -  [place] or [place] Complete Complete  Social Work Consult for [place] Planning/Counseling Complete Complete  Palliative Care Screening Not Applicable Not Applicable  Medication Review Press photographer) Complete -  Some recent data might be hidden

## 2020-06-09 DIAGNOSIS — I5033 Acute on chronic diastolic (congestive) heart failure: Secondary | ICD-10-CM | POA: Diagnosis not present

## 2020-06-09 LAB — GLUCOSE, CAPILLARY
Glucose-Capillary: 115 mg/dL — ABNORMAL HIGH (ref 70–99)
Glucose-Capillary: 176 mg/dL — ABNORMAL HIGH (ref 70–99)
Glucose-Capillary: 198 mg/dL — ABNORMAL HIGH (ref 70–99)
Glucose-Capillary: 166 mg/dL — ABNORMAL HIGH (ref 70–99)

## 2020-06-09 LAB — BASIC METABOLIC PANEL
Anion gap: 11 (ref 5–15)
BUN: 83 mg/dL — ABNORMAL HIGH (ref 8–23)
CO2: 28 mmol/L (ref 22–32)
Calcium: 8.4 mg/dL — ABNORMAL LOW (ref 8.9–10.3)
Chloride: 97 mmol/L — ABNORMAL LOW (ref 98–111)
Creatinine, Ser: 4.43 mg/dL — ABNORMAL HIGH (ref 0.44–1.00)
GFR, Estimated: 10 mL/min — ABNORMAL LOW (ref >60)
Glucose, Bld: 108 mg/dL — ABNORMAL HIGH (ref 70–99)
Potassium: 3.7 mmol/L (ref 3.5–5.1)
Sodium: 136 mmol/L (ref 135–145)

## 2020-06-09 NOTE — Care Management Important Message (Signed)
Important Message  Patient Details  Name: [name] MRN: [id number] Date of Birth: 1950/05/25   Medicare Important Message Given:  Yes     [name] 06/09/2020, 9:58 AM

## 2020-06-09 NOTE — Progress Notes (Signed)
SATURATION QUALIFICATIONS: (This note is used to comply with regulatory documentation for home oxygen)  Patient Saturations on Room Air at Rest = 96%  Patient Saturations on Room Air while Ambulating = 94%  Patient Saturations on 1 Liters of oxygen while Ambulating = 97%  Please briefly explain why patient needs home oxygen:  Patient ambulated in hallway with and without oxygen. HR 88 while ambulating. Oxygen saturation > or = to 94% without oxygen while ambulating. Patient tolerated well. No SOB.  Patient on RA sitting on bedside. O2 sats 96% and HR 82.

## 2020-06-09 NOTE — TOC Transition Note (Signed)
Transition of Care [facility]) - CM/SW Discharge Note   Patient Details  Name: [name] MRN: [id number] Date of Birth: [birth year]  Transition of Care [facility]) CM/SW Contact:  Zenon Mayo, RN Phone Number: 06/09/2020, 9:36 AM   Clinical Narrative:    Patient is for dc today, she is set up with [facility].  K[place] notified of dc today.    Final next level of care: [place] Barriers to Discharge: No Barriers Identified   Patient Goals and CMS Choice Patient states their goals for this hospitalization and ongoing recovery are:: return home CMS Medicare.gov Compare Post Acute Care list provided to:: Patient Choice offered to / list presented to : Patient  Discharge Placement                       Discharge Plan and Services In-house Referral: NA Discharge Planning Services: CM Consult Post Acute Care Choice: Home Health,Resumption of Svcs/PTA Provider            DME Agency: NA       HH Arranged: RN,Disease Management,PT,OT HH Agency: [place] ([place]) Date [place]: 06/08/20 Time [place]: 1337 Representative spoke with at [place]: [place] ([place]) Interventions     Readmission Risk Interventions Readmission Risk Prevention Plan 06/08/2020 05/20/2020  Transportation Screening Complete Complete  PCP or Specialist Appt within 3-5 Days Complete -  [place] or [place] Complete Complete  Social Work Consult for [place] Planning/Counseling Complete Complete  Palliative Care Screening Not Applicable Not Applicable  Medication Review Press photographer) Complete -  Some recent data might be hidden

## 2020-06-09 NOTE — Progress Notes (Addendum)
PROGRESS NOTE    [name]   VEL:[id number]  DOB: May 06, 1950  DOA: 06/02/2020 PCP: Erby Pian, PA-C   Brief Narrative:  [name] 70 year old female with past medical history of hyperlipidemia, gastroesophageal reflux disease, diabetes mellitus type 2, chronic kidney disease stage IV, hypertension and recent gastrointestinal bleed(normal EGD 4/27 however)who was just discharged from [facility] on 4/29 presenting with shortness of breath. Diagnosed with acute CHF in the ED and admitted for diuresis.     Subjective: No new complaints.   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  - cardiology initially recommended outpatient PYP scan to evaluate for TTR amyloid and  SPEP/UPEP/light chains to evaluate for AL amyloid- subsequently, cardiology felt ECHO was more consistent with hypertensive heart disease - CXR 5/13 > persistent b/l infiltrates- appear to be worsening  - BNP 669.8>> 643.2 and thus not much improved - 5/14 - pulse ox dropped to 80s on room air - no leukocytosis- max temp 99.6 on 5/12- low suspicion for infection - Nephrology consulted and assisting with diuresis - Cr improving- no need for dialysis yet per Nephro- appreciate assistance -  IV diuretics held today- Touched base with Dr Joylene Grapes, Nephrology, today and he recommends to follow Cr for at least one more day    Active Problems: Hypertensive urgency - BPs 211/66, 185/68 around the time of admission - on multiple meds including amlodipine, Clonidine, Toprol - added Hydralazine and Imdur & d/c'd Clonidine this admission   Type 2 diabetes mellitus - A1c 6.2 on 5/11 - dc'd Prandin and Acarbose for now - cont SSI  - follows with Dr Loanne Drilling  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  - I have also seen that her mood significantly improves when family is at bedside  Note: Bromocriptine on med list- patient does not know why she is on this     Time spent in minutes: 35 DVT prophylaxis: enoxaparin (LOVENOX) injection 30 mg Start: 06/03/20 1400  Code Status: full code  Family Communication: sister at bedside Level of Care: Level of care: Telemetry Medical Disposition Plan:  Status is: Inpatient  Remains inpatient appropriate because:Inpatient level of care appropriate due to severity of illness   Dispo: The patient is from: Home              Anticipated d/c is to: Home              Patient currently is not medically stable to d/c.   Difficult to place patient No      Consultants:   Cardiology  Nephrology  Procedures:     Antimicrobials:  Anti-infectives (From admission, onward)   None       Objective:  Vitals:   06/09/20 0546 06/09/20 0546 06/09/20 0808 06/09/20 1055  BP: (!) 160/57 (!) 160/57 (!) 146/47 (!) 161/50  Pulse:  78 81 81  Resp:      Temp:   98.6 F (37 C)   TempSrc:   Oral   SpO2:   97% 94%  Weight:      Height:        Intake/Output Summary (Last 24 hours) at 06/09/2020 1222 Last data filed at 06/09/2020 0419 Gross per 24 hour  Intake 357 ml  Output 1300 ml  Net -943 ml   Filed Weights   06/07/20 0343 06/08/20 0100 06/09/20 0413  Weight: 58.5 kg 57.2 kg 56 kg     Examination: General exam: Appears comfortable  HEENT: PERRLA, oral mucosa moist, no sclera icterus or thrush Respiratory system: mild crackles at bases- Respiratory effort normal. Cardiovascular system: S1 & S2 heard, regular rate and rhythm Gastrointestinal system: Abdomen soft, non-tender, nondistended. Normal bowel sounds   Central nervous system: Alert and oriented. No focal neurological deficits. Extremities: No cyanosis, clubbing or edema Skin: No rashes or ulcers Psychiatry:  Mood & affect appropriate.    Data Reviewed: I have personally reviewed following labs and imaging studies  CBC: Recent Labs  Lab 06/02/20 2321 06/03/20 0821 06/05/20 0738 06/06/20 0653 06/08/20 0834  WBC 8.3 6.9 6.4 7.2 6.6  NEUTROABS 5.7 4.5  --   --   --   HGB 8.7* 8.1* 7.9* 9.1* 8.6*  HCT 27.1* 24.8* 23.7* 27.9* 26.3*  MCV 95.1 96.1 96.7 97.2 94.3  PLT 286 274 278 289 315   Basic Metabolic Panel: Recent Labs  Lab 06/05/20 0342 06/06/20 0302 06/07/20 0326 06/08/20 0834 06/09/20 0547  NA 139 136 136 137 136  K 4.0 3.9 4.2 3.8 3.7  CL 103 101 98 97* 97*  CO2 26 23 23 27 28   GLUCOSE 95 113* 173* 176* 108*  BUN 76* 76* 82* 84* 83*  CREATININE 4.06* 4.67* 4.29* 4.21* 4.43*  CALCIUM 8.5* 8.6* 8.9 8.7* 8.4*   GFR: Estimated Creatinine Clearance: 9.3 mL/min (A) (by C-G formula based on SCr of 4.43 mg/dL (H)). Liver Function Tests: Recent Labs  Lab 06/02/20 2321  AST 26  ALT 44  ALKPHOS 108  BILITOT 1.9*  PROT 6.8  ALBUMIN 3.2*   No results for input(s): LIPASE, AMYLASE in the last 168 hours. No results for input(s): AMMONIA in the last 168 hours. Coagulation Profile: No results for input(s): INR, PROTIME in the last 168 hours. Cardiac Enzymes: No results for input(s): CKTOTAL, CKMB, CKMBINDEX, TROPONINI in the last 168 hours. BNP (last 3 results) No results for input(s): PROBNP in the last 8760 hours. HbA1C: No results for input(s): HGBA1C in the last 72  hours. CBG: Recent Labs  Lab 06/08/20 1027 06/08/20 1542 06/08/20 2039 06/09/20 0554 06/09/20 1207  GLUCAP 154* 178* 263* 115* 176*   Lipid Profile: No results for input(s): CHOL, HDL, LDLCALC, TRIG, CHOLHDL, LDLDIRECT in the last 72 hours. Thyroid Function Tests: No results for input(s): TSH, T4TOTAL, FREET4, T3FREE, THYROIDAB in the last 72 hours. Anemia Panel: Recent Labs    06/08/20 0834  FERRITIN 290  TIBC 223*  IRON 36   Urine analysis:    Component Value Date/Time   COLORURINE YELLOW 06/07/2020 0041   APPEARANCEUR CLEAR 06/07/2020 0041   LABSPEC 1.008 06/07/2020 0041   PHURINE 5.0 06/07/2020 0041   GLUCOSEU NEGATIVE 06/07/2020 0041   GLUCOSEU NEGATIVE 09/04/2009 0721   HGBUR NEGATIVE 06/07/2020 0041  BILIRUBINUR NEGATIVE 06/07/2020 0041   BILIRUBINUR negative 09/24/2019 1003   KETONESUR NEGATIVE 06/07/2020 0041   PROTEINUR 100 (A) 06/07/2020 0041   UROBILINOGEN 0.2 09/24/2019 1003   UROBILINOGEN 0.2 09/04/2009 0721   NITRITE NEGATIVE 06/07/2020 0041   LEUKOCYTESUR NEGATIVE 06/07/2020 0041   Sepsis Labs: @LABRCNTIP (procalcitonin:4,lacticidven:4) ) Recent Results (from the past 240 hour(s))  Resp Panel by RT-PCR (Flu A&B, Covid) Nasopharyngeal Swab     Status: None   Collection Time: 06/03/20 12:48 AM   Specimen: Nasopharyngeal Swab; Nasopharyngeal(NP) swabs in vial transport medium  Result Value Ref Range Status   SARS Coronavirus 2 by RT PCR NEGATIVE NEGATIVE Final    Comment: (NOTE) SARS-CoV-2 target nucleic acids are NOT DETECTED.  The SARS-CoV-2 RNA is generally detectable in upper respiratory specimens during the acute phase of infection. The lowest concentration of SARS-CoV-2 viral copies this assay can detect is 138 copies/mL. A negative result does not preclude SARS-Cov-2 infection and should not be used as the sole basis for treatment or other patient management decisions. A negative result may occur with  improper specimen  collection/handling, submission of specimen other than nasopharyngeal swab, presence of viral mutation(s) within the areas targeted by this assay, and inadequate number of viral copies(<138 copies/mL). A negative result must be combined with clinical observations, patient history, and epidemiological information. The expected result is Negative.  Fact Sheet for Patients:  EntrepreneurPulse.com.au  Fact Sheet for Healthcare Providers:  IncredibleEmployment.be  This test is no t yet approved or cleared by the [place] FDA and  has been authorized for detection and/or diagnosis of SARS-CoV-2 by FDA under an Emergency Use Authorization (EUA). This EUA will remain  in effect (meaning this test can be used) for the duration of the COVID-19 declaration under Section 564(b)(1) of the Act, 21 U.S.C.section 360bbb-3(b)(1), unless the authorization is terminated  or revoked sooner.       Influenza A by PCR NEGATIVE NEGATIVE Final   Influenza B by PCR NEGATIVE NEGATIVE Final    Comment: (NOTE) The Xpert Xpress SARS-CoV-2/FLU/RSV plus assay is intended as an aid in the diagnosis of influenza from Nasopharyngeal swab specimens and should not be used as a sole basis for treatment. Nasal washings and aspirates are unacceptable for Xpert Xpress SARS-CoV-2/FLU/RSV testing.  Fact Sheet for Patients: EntrepreneurPulse.com.au  Fact Sheet for Healthcare Providers: IncredibleEmployment.be  This test is not yet approved or cleared by the [place] FDA and has been authorized for detection and/or diagnosis of SARS-CoV-2 by FDA under an Emergency Use Authorization (EUA). This EUA will remain in effect (meaning this test can be used) for the duration of the COVID-19 declaration under Section 564(b)(1) of the Act, 21 U.S.C. section 360bbb-3(b)(1), unless the authorization is terminated or revoked.  Performed at [facility], [place] [address] [place], [place] [address]          Radiology Studies: No results found.    Scheduled Meds: . allopurinol  100 mg Oral Daily  . amLODipine  10 mg Oral Daily  . atorvastatin  80 mg Oral Daily  . bromocriptine  2.5 mg Oral Daily  . enoxaparin (LOVENOX) injection  30 mg Subcutaneous Daily  . hydrALAZINE  50 mg Oral Q8H  . insulin aspart  0-9 Units Subcutaneous TID AC & HS  . isosorbide mononitrate  30 mg Oral Daily  . metoprolol succinate  100 mg Oral Daily  . minoxidil  10 mg Oral BID  . pantoprazole  40 mg Oral Daily  .  polyethylene glycol  17 g Oral Daily  . sodium chloride flush  3 mL Intravenous Q12H  . torsemide  80 mg Oral BID   Continuous Infusions: . sodium chloride    . ferric gluconate (FERRLECIT/NULECIT) IV 250 mg (06/08/20 1759)     LOS: 5 days      Debbe Odea, MD [facility] Pager: [email or web address] 06/09/2020, 12:22 PM

## 2020-06-09 NOTE — Progress Notes (Signed)
Nephrology Follow-Up Consult note   Assessment/Recommendations: [name] is a/an 70 y.o. female with a past medical history significant for HTN, HLD, CVA, DM 2, CKD, CHF, admitted for CHF exacerbation.       Non-Oliguric AKI on CKD 4: Likely secondary to cardiorenal syndrome.  Follows with Dr. Joelyn Oms outpatient.  Volume status overall improved -Torsemide 80 mg twice daily today -If creatinine stable or improving tomorrow likely can discharge -Will need follow-up with Dr. Joelyn Oms after discharge -Hypertension CHF management as below -Continue to monitor daily Cr, Dose meds for GFR -Monitor Daily I/Os, Daily weight  -Maintain MAP>65 for optimal renal perfusion.  -Avoid nephrotoxic medications including NSAIDs and Vanc/Zosyn combo  CHF exacerbation: Likely hypertensive heart disease with decreased renal function at baseline and can volume status difficult.  Cardiology following.  Some concern for cardiac amyloid with plans to work-up outpatient.   Hypertension: Must be severe and difficult to control hypertension on hydralazine and minoxidil.  Can maintain current medications but one should monitor closely for pericardial effusion when taking minoxidil especially in combination with hydralazine.  Anemia: likely multifactorial. On iron and epo as an outpatient. receiving retacrit monthly, may need a dose of esa here if she stays in house.  Iron saturation and ferritin were low yesterday.  Iron 250 mg ordered x4 doses.  Uncontrolled Diabetes Mellitus Type 2 with Hyperglycemia: mgmt per primary  Recommendations conveyed to primary service.    [facility] 06/09/2020 9:52 AM  ___________________________________________________________  CC: AKI on CKD  Interval History/Subjective: Patient feels well today.  Continues on oxygen but does not wear at home.  Denies any specific chest pain.  Mild shortness of breath.   Medications:  Current  Facility-Administered Medications  Medication Dose Route Frequency Provider Last Rate Last Admin  . 0.9 %  sodium chloride infusion  250 mL Intravenous PRN Shalhoub, Sherryll Burger, MD      . acetaminophen (TYLENOL) tablet 650 mg  650 mg Oral Q4H PRN Shalhoub, Sherryll Burger, MD      . allopurinol (ZYLOPRIM) tablet 100 mg  100 mg Oral Daily Shalhoub, Sherryll Burger, MD   100 mg at 06/08/20 1042  . amLODipine (NORVASC) tablet 10 mg  10 mg Oral Daily Shalhoub, Sherryll Burger, MD   10 mg at 06/08/20 1042  . atorvastatin (LIPITOR) tablet 80 mg  80 mg Oral Daily Shalhoub, Sherryll Burger, MD   80 mg at 06/08/20 1042  . bromocriptine (PARLODEL) tablet 2.5 mg  2.5 mg Oral Daily Shalhoub, Sherryll Burger, MD   2.5 mg at 06/08/20 1104  . enoxaparin (LOVENOX) injection 30 mg  30 mg Subcutaneous Daily Shalhoub, Sherryll Burger, MD   30 mg at 06/08/20 1041  . ferric gluconate (FERRLECIT) 250 mg in sodium chloride 0.9 % 100 mL IVPB  250 mg Intravenous Daily Reesa Chew, MD 120 mL/hr at 06/08/20 1759 250 mg at 06/08/20 1759  . hydrALAZINE (APRESOLINE) tablet 50 mg  50 mg Oral Q8H Debbe Odea, MD   50 mg at 06/09/20 0546  . insulin aspart (novoLOG) injection 0-9 Units  0-9 Units Subcutaneous TID AC & HS Shalhoub, Sherryll Burger, MD   5 Units at 06/08/20 2053  . isosorbide mononitrate (IMDUR) 24 hr tablet 30 mg  30 mg Oral Daily Debbe Odea, MD   30 mg at 06/08/20 1041  . metoprolol succinate (TOPROL-XL) 24 hr tablet 100 mg  100 mg Oral Daily Shalhoub, Sherryll Burger, MD   100 mg at 06/08/20 1041  .  minoxidil (LONITEN) tablet 10 mg  10 mg Oral BID Vernelle Emerald, MD   10 mg at 06/08/20 1104  . ondansetron (ZOFRAN) injection 4 mg  4 mg Intravenous Q6H PRN Shalhoub, Sherryll Burger, MD   4 mg at 06/04/20 1401  . pantoprazole (PROTONIX) EC tablet 40 mg  40 mg Oral Daily Shalhoub, Sherryll Burger, MD   40 mg at 06/08/20 1041  . polyethylene glycol (MIRALAX / GLYCOLAX) packet 17 g  17 g Oral Daily Debbe Odea, MD   17 g at 06/08/20 1042  . sodium chloride flush (NS) 0.9 %  injection 3 mL  3 mL Intravenous Q12H Shalhoub, Sherryll Burger, MD   3 mL at 06/08/20 2101  . sodium chloride flush (NS) 0.9 % injection 3 mL  3 mL Intravenous PRN Shalhoub, Sherryll Burger, MD      . torsemide (DEMADEX) tablet 80 mg  80 mg Oral BID Reesa Chew, MD   80 mg at 06/09/20 0546      Review of Systems: 10 systems reviewed and negative except per interval history/subjective  Physical Exam: Vitals:   06/09/20 0546 06/09/20 0808  BP: (!) 160/57 (!) 146/47  Pulse: 78 81  Resp:    Temp:  98.6 F (37 C)  SpO2:  97%   No intake/output data recorded.  Intake/Output Summary (Last 24 hours) at 06/09/2020 2458 Last data filed at 06/09/2020 0419 Gross per 24 hour  Intake 360 ml  Output 1600 ml  Net -1240 ml   Constitutional: well-appearing, no acute distress ENMT: ears and nose without scars or lesions, MMM CV: normal rate, no edema Respiratory: Bilateral chest rise normal work of breathing Gastrointestinal: soft, non-tender, no palpable masses or hernias Skin: no visible lesions or rashes Psych: alert, judgement/insight appropriate, appropriate mood and affect   Test Results I personally reviewed new and old clinical labs and radiology tests Lab Results  Component Value Date   NA 136 06/09/2020   K 3.7 06/09/2020   CL 97 (L) 06/09/2020   CO2 28 06/09/2020   BUN 83 (H) 06/09/2020   CREATININE 4.43 (H) 06/09/2020   GFR 25.74 (L) 07/11/2018   CALCIUM 8.4 (L) 06/09/2020   ALBUMIN 3.2 (L) 06/02/2020   PHOS 6.6 (H) 05/21/2020

## 2020-06-10 ENCOUNTER — Other Ambulatory Visit (HOSPITAL_COMMUNITY): Payer: Self-pay

## 2020-06-10 DIAGNOSIS — I5033 Acute on chronic diastolic (congestive) heart failure: Secondary | ICD-10-CM | POA: Diagnosis not present

## 2020-06-10 LAB — CBC
HCT: 27.6 % — ABNORMAL LOW (ref 36.0–46.0)
Hemoglobin: 8.9 g/dL — ABNORMAL LOW (ref 12.0–15.0)
MCH: 29.7 pg (ref 26.0–34.0)
MCHC: 32.2 g/dL (ref 30.0–36.0)
MCV: 92 fL (ref 80.0–100.0)
Platelets: 240 10*3/uL (ref 150–400)
RBC: 3 MIL/uL — ABNORMAL LOW (ref 3.87–5.11)
RDW: 16.6 % — ABNORMAL HIGH (ref 11.5–15.5)
WBC: 5.5 10*3/uL (ref 4.0–10.5)
nRBC: 0 % (ref 0.0–0.2)

## 2020-06-10 LAB — RENAL FUNCTION PANEL
Albumin: 3.3 g/dL — ABNORMAL LOW (ref 3.5–5.0)
Anion gap: 15 (ref 5–15)
BUN: 85 mg/dL — ABNORMAL HIGH (ref 8–23)
CO2: 27 mmol/L (ref 22–32)
Calcium: 8.7 mg/dL — ABNORMAL LOW (ref 8.9–10.3)
Chloride: 94 mmol/L — ABNORMAL LOW (ref 98–111)
Creatinine, Ser: 4.39 mg/dL — ABNORMAL HIGH (ref 0.44–1.00)
GFR, Estimated: 10 mL/min — ABNORMAL LOW (ref >60)
Glucose, Bld: 104 mg/dL — ABNORMAL HIGH (ref 70–99)
Phosphorus: 5.3 mg/dL — ABNORMAL HIGH (ref 2.5–4.6)
Potassium: 3.5 mmol/L (ref 3.5–5.1)
Sodium: 136 mmol/L (ref 135–145)

## 2020-06-10 LAB — GLUCOSE, CAPILLARY
Glucose-Capillary: 117 mg/dL — ABNORMAL HIGH (ref 70–99)
Glucose-Capillary: 191 mg/dL — ABNORMAL HIGH (ref 70–99)

## 2020-06-10 MED ORDER — TORSEMIDE 20 MG PO TABS
80.0000 mg | ORAL_TABLET | Freq: Two times a day (BID) | ORAL | 0 refills | Status: DC
  Filled 2020-06-10: qty 240, 30d supply, fill #0

## 2020-06-10 MED ORDER — HYDRALAZINE HCL 50 MG PO TABS
50.0000 mg | ORAL_TABLET | Freq: Three times a day (TID) | ORAL | 0 refills | Status: DC
  Filled 2020-06-10: qty 90, 30d supply, fill #0

## 2020-06-10 MED ORDER — ISOSORBIDE MONONITRATE ER 30 MG PO TB24
30.0000 mg | ORAL_TABLET | Freq: Every day | ORAL | 0 refills | Status: DC
  Filled 2020-06-10: qty 30, 30d supply, fill #0

## 2020-06-10 NOTE — Plan of Care (Signed)

## 2020-06-10 NOTE — Consult Note (Signed)
   [facility] Inpatient Consult   06/10/2020  [name] [birth year] [id number]  [employer] [facility] [[facility]] Patient: [name]  Primary Care Provider: Erby Pian, PA-C [facility]     Reason:  Patient's PCP is not a beneficiary currently attributed to one of the [place] populations.   Patient's primary care provider is not an in network provider at this time.  Of note, [facility] Care Management services does not replace or interfere with any services that are arranged by inpatient Transition of Care [TOC] team   For additional questions or referrals please contact:    For questions contact:   Natividad Brood, RN BSN [facility] Liaison  [phone] business mobile phone Toll free office [phone]  Fax number: [phone] [email or web address]

## 2020-06-10 NOTE — Discharge Summary (Signed)
Physician Discharge Summary  [name] DPO:[id number] DOB: [birth year] DOA: 06/02/2020  PCP: Erby Pian, PA-C  Admit date: 06/02/2020 Discharge date: 06/10/2020  Admitted From: Home Disposition:  Home with home health   Recommendations for Outpatient Follow-up:  1. Follow up with PCP in 1 week 2. Follow up with Dr. Joelyn Oms Nephrology in a week with repeat lab work  3. Follow-up with Dr. Debara Pickett cardiology in 4 to 6 weeks  Discharge Condition: Stable CODE STATUS: Full  Diet recommendation:  Diet Orders (From admission, onward)    Start     Ordered   06/06/20 1556  Diet renal with fluid restriction Fluid restriction: 1200 mL Fluid; Room service appropriate? Yes; Fluid consistency: Thin  Diet effective now       Comments: Patient now has a 1200 cc/day fluid restriction. Patient has advanced CKD and CHF and it is very important that fluids be restricted on a day-to-day basis. PLACE SIGNS in room and on the door.  Thanks.  Question Answer Comment  Fluid restriction: 1200 mL Fluid   Room service appropriate? Yes   Fluid consistency: Thin      06/06/20 1556         Brief/Interim Summary: From H&P by Dr. Cyd Silence: "70 year old female with past medical history of hyperlipidemia, gastroesophageal reflux disease, diabetes mellitus type 2, chronic kidney disease stage IV, hypertension and recent gastrointestinal bleed (normal EGD 4/27 however) who was just discharged from [facility] on 4/29 presenting with shortness of breath.  Of note, patient was hospitalized at [facility] from 4/25 until 4/29.  Patient initially presented with fatigue and hemoglobin of 6.6.  Patient underwent 2 unit packed red blood cell transfusion.  Patient underwent EGD on 4/27 which did not identify any specific source of bleeding.  Patient also experienced acute kidney injury superimposed on chronic kidney disease stage IV.  Renal function stabilized, bleeding stopped and hemoglobin and  hematocrit remained stable and therefore the patient was discharged on 4/29.  Patient explains that for several days since her discharge from the hospital she was feeling well but between 3 and 5 days ago patient began to experience bilateral lower extremity edema.  There is bilateral lower extremity edema progressively worsened over the next several days and was associated with increasing shortness of breath.  Shortness of breath was worse with exertion and improved with rest.  Shortness of breath became more more severe in intensity.  Patient additionally complaining of associated paroxysmal nocturnal dyspnea.  Patient denies pillow orthopnea.  Patient denies cough, fever, sick contacts, recent travel or contact with confirmed COVID-19 infection.  Patient does report an approximate 10 pound weight gain over the span of time.  Due to these worsening symptoms the patient eventually presented once again to [facility] emergency department for evaluation.  Upon evaluation in the emergency department patient was felt to have signs and symptoms concerning for acute cardiogenic volume overload with peripheral edema, pulmonary rales and concerns for pulmonary edema on chest x-ray.  Patient was administered 60 mg of intravenous Lasix.  Patient was placed on supplemental oxygen due to bouts of hypoxia with minimal exertion.  The hospitalist group was then called to assess the patient for admission to the hospital."  Cardiology as well as nephrology were consulted.  Patient was diuresed.  It was determined that patient did not need dialysis yet.  Elevated troponin thought to be secondary to acute CHF and AKI.  There was some concern for cardiac amyloidosis, work-up  deferred to outpatient.  Kidney function stabilized, BP improved.  Patient was deemed stable for discharge home with home health.  Patient was stable on room air prior to discharge home.  Discharge Diagnoses:  Principal Problem:   Acute on  chronic diastolic CHF (congestive heart failure) (HCC) Active Problems:   Hypertensive urgency   Mixed diabetic hyperlipidemia associated with type 2 diabetes mellitus (HCC)   Anemia   Type 2 diabetes mellitus with stage 4 chronic kidney disease, without long-term current use of insulin (HCC)   CKD (chronic kidney disease), stage IV (HCC)   Shortness of breath   GERD without esophagitis   Elevated troponin level not due myocardial infarction   Discharge Instructions  Discharge Instructions    (HEART FAILURE PATIENTS) Call MD:  Anytime you have any of the following symptoms: 1) 3 pound weight gain in 24 hours or 5 pounds in 1 week 2) shortness of breath, with or without a dry hacking cough 3) swelling in the hands, feet or stomach 4) if you have to sleep on extra pillows at night in order to breathe.   Complete by: As directed    Call MD for:  difficulty breathing, headache or visual disturbances   Complete by: As directed    Call MD for:  extreme fatigue   Complete by: As directed    Call MD for:  persistant dizziness or light-headedness   Complete by: As directed    Call MD for:  persistant nausea and vomiting   Complete by: As directed    Call MD for:  severe uncontrolled pain   Complete by: As directed    Call MD for:  temperature >100.4   Complete by: As directed    Discharge instructions   Complete by: As directed    .[email or web address]   Increase activity slowly   Complete by: As directed      Allergies as of 06/10/2020      Reactions   Semaglutide Rash   Rybelsus 3 mg      Medication List    STOP taking these medications   cloNIDine 0.2 MG tablet Commonly known as: CATAPRES   furosemide 80 MG tablet Commonly known as: LASIX     TAKE these medications   acarbose 25 MG tablet Commonly known as: PRECOSE Take 0.5 tablets (12.5 mg total) by mouth 3 (three) times daily with meals.   allopurinol 100 MG tablet Commonly known as: ZYLOPRIM Take 1 tablet (100 mg total) by  mouth daily.   amLODipine 10 MG tablet Commonly known as: NORVASC TAKE 1 TABLET BY MOUTH EVERY DAY   aspirin 325 MG tablet Take 325 mg by mouth daily.   atorvastatin 80 MG tablet Commonly known as: LIPITOR TAKE 1 TABLET BY MOUTH EVERY DAY What changed:   how much to take  how to take this  when to take this  additional instructions   bromocriptine 2.5 MG tablet Commonly known as: PARLODEL Take 0.5 tablets (1.25 mg total) by mouth daily. What changed: how much to take   epoetin alfa-epbx 40000 UNIT/ML injection Commonly known as: RETACRIT Inject 40,000 Units into the skin every 28 (twenty-eight) days.   feeding supplement Liqd Take 237 mLs by mouth 2 (two) times daily between meals. What changed:   when to take this  reasons to take this   hydrALAZINE 50 MG tablet Commonly known as: APRESOLINE Take 1 tablet (50 mg total) by mouth every 8 (eight) hours.   isosorbide mononitrate 30 MG 24  hr tablet Commonly known as: IMDUR Take 1 tablet (30 mg total) by mouth daily. Start taking on: Jun 11, 2020   metoprolol succinate 100 MG 24 hr tablet Commonly known as: TOPROL-XL Take 100 mg by mouth daily.   minoxidil 10 MG tablet Commonly known as: LONITEN Take 10 mg by mouth 2 (two) times daily.   OneTouch Delica Lancets 29H Misc 1 each by Does not apply route 2 (two) times daily. E11.9   OneTouch Verio Flex System w/Device Kit 1 each by Does not apply route 2 (two) times daily. E11.9   OneTouch Verio test strip Generic drug: glucose blood USE AS DIRECTED TWICE DAILY   pantoprazole 40 MG tablet Commonly known as: PROTONIX Take 1 tablet (40 mg total) by mouth daily.   polyethylene glycol 17 g packet Commonly known as: MIRALAX / GLYCOLAX Take 34-51 g by mouth daily as needed for mild constipation.   repaglinide 2 MG tablet Commonly known as: PRANDIN Take 1 tablet (2 mg total) by mouth 2 (two) times daily before a meal.   Torsemide 40 MG Tabs Take 80 mg by  mouth 2 (two) times daily.       Follow-up Information    Erby Pian, PA-C. Go on 06/24/2020.   Specialty: Physician Assistant Why: _0 :40pm Contact information: [address] [place] [address] [phone]        [facility] [place].   Specialty: Home Health Services Why: HHRN,HHPT, HHOT       Rexene Agent, MD. Schedule an appointment as soon as possible for a visit in 1 week(s).   Specialty: Nephrology Contact information: [place] [id number] [phone]        Pixie Casino, MD. Schedule an appointment as soon as possible for a visit in 4 week(s).   Specialty: Cardiology Contact information: [place] [address] [phone]              Allergies  Allergen Reactions  . Semaglutide Rash    Rybelsus 3 mg    Consultations:  Cardiology  Nephrology   Procedures/Studies: DG Chest 2 View  Result Date: 06/05/2020 CLINICAL DATA:  Shortness of breath. EXAM: CHEST - 2 VIEW COMPARISON:  Chest x-rays dated 06/02/2020 and and 09/24/2019. FINDINGS: Prominent interstitial markings and/or ground-glass opacities are again seen throughout the lower lobes bilaterally, stable to slightly increased compared to most recent chest x-ray of 06/02/2020, new compared to the earlier study of 09/24/2019. Stable cardiomegaly. No pleural effusion or pneumothorax is seen. Osseous structures about the chest are unremarkable. IMPRESSION: Probable bibasilar pneumonia. COVID pneumonia can have this appearance. Alternatively, if afebrile, this could represent CHF with asymmetric pulmonary edema pattern. Electronically Signed   By: Franki Cabot M.D.   On: 06/05/2020 11:54   US RENAL  Result Date: 06/06/2020 CLINICAL DATA:  Acute on chronic kidney disease EXAM: RENAL / URINARY TRACT ULTRASOUND COMPLETE COMPARISON:  CT abdomen pelvis 05/08/2020 FINDINGS: Right Kidney: Renal measurements: 10.4 x  4.6 x 5.5 cm = volume: 138 mL. Echogenicity is increased. No mass or hydronephrosis visualized. Left Kidney: Renal measurements: 9.5 x 5.7 x 4.5 cm = volume: 153 mL. Echogenicity is increased. No hydronephrosis. There is a subcentimeter cyst in the lateral aspect of the kidney. Bladder: Appears normal for degree of bladder distention. Other: Incidentally noted small right pleural effusion. IMPRESSION: 1.  No acute finding sonographically in the bilateral kidneys. 2. Increased echogenicity of the bilateral kidneys as  can be seen in medical renal disease. Subcentimeter cyst in the left kidney. Electronically Signed   By: Audie Pinto M.D.   On: 06/06/2020 16:35   DG Chest Portable 1 View  Result Date: 06/02/2020 CLINICAL DATA:  Shortness of breath for 2-3 days EXAM: PORTABLE CHEST 1 VIEW COMPARISON:  09/24/2019 FINDINGS: Cardiac shadow is enlarged but stable. Patchy airspace opacity is noted in the left lung base consistent with acute infiltrate. No sizable effusion is noted. No bony abnormality is seen. IMPRESSION: Patchy airspace opacity in the left base consistent with acute pneumonia. Electronically Signed   By: Inez Catalina M.D.   On: 06/02/2020 23:36   DG Knee Complete 4 Views Left  Result Date: 05/18/2020 CLINICAL DATA:  Knee pain, no known trauma EXAM: LEFT KNEE - COMPLETE 4+ VIEW COMPARISON:  December 01, 2019 FINDINGS: No evidence of fracture or dislocation. Small joint effusion. 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 Superior patellar pole again visualized. Vascular calcifications. IMPRESSION: No acute osseous abnormality visualized. 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 Small joint effusion. Electronically Signed   By: Dahlia Bailiff MD   On: 05/18/2020 17:04   DG Abdomen Acute W/Chest  Result Date: 06/03/2020 CLINICAL DATA:  70 year old female with constipation. EXAM: DG ABDOMEN ACUTE WITH 1 VIEW CHEST COMPARISON:  CT  abdomen pelvis dated 05/08/2020. FINDINGS: There is mild diffuse interstitial coarsening. Left lung base linear atelectasis/scarring. No focal consolidation, pleural effusion, or pneumothorax. Mild cardiomegaly. Atherosclerotic calcification of the aorta. There is no bowel dilatation or evidence of obstruction. No significant colonic stool burden. No free air or radiopaque calculi. Osteopenia with degenerative changes of the spine. No acute osseous pathology. IMPRESSION: 1. No acute cardiopulmonary process. 2. No evidence of bowel obstruction. Electronically Signed   By: Anner Crete M.D.   On: 06/03/2020 01:41   ECHOCARDIOGRAM COMPLETE  Result Date: 06/03/2020    ECHOCARDIOGRAM REPORT   Patient Name:   DARLETH EUSTACHE Date of Exam: 06/03/2020 Medical Rec #:  [id number]         Height:       62.0 in Accession #:    [id number]        Weight:       139.0 lb Date of Birth:  1950-02-12          BSA:          1.638 m Patient Age:    70 years          BP:           167/58 mmHg Patient Gender: F                 HR:           69 bpm. Exam Location:  Inpatient Procedure: 2D Echo, Cardiac Doppler and Color Doppler Indications:    CHF-Acute Systolic C00.34  History:        Patient has prior history of Echocardiogram examinations, most                 recent 01/09/2015.  Sonographer:    Merrie Roof Referring Phys: [id number] [place]  1. Left ventricular ejection fraction, by estimation, is 70 to 75%. The left ventricle has hyperdynamic function. The left ventricle has no regional wall motion abnormalities. There is severe left ventricular hypertrophy. Left ventricular diastolic parameters are consistent with Grade II diastolic dysfunction (pseudonormalization). Elevated left atrial pressure.  2. Right ventricular systolic function is  normal. The right ventricular size is normal. Tricuspid regurgitation signal is inadequate for assessing PA pressure.  3. Left atrial size was severely dilated.  4. The  mitral valve is normal in structure. Trivial mitral valve regurgitation.  5. The aortic valve is tricuspid. Aortic valve regurgitation is trivial. Mild aortic valve stenosis. Vmax 2.5 m/s, MG 15 mmHg, AVA 1.5 cm^2, DI 0.65  6. 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 Recommend PYP scan to evaluate for TTR amyloid and SPEP/UPEP/light chains to evaluate for AL amyloid FINDINGS  Left Ventricle: Left ventricular ejection fraction, by estimation, is 70 to 75%. The left ventricle has hyperdynamic function. The left ventricle has no regional wall motion abnormalities. The left ventricular internal cavity size was normal in size. There is severe left ventricular hypertrophy. Left ventricular diastolic parameters are consistent with Grade II diastolic dysfunction (pseudonormalization). Elevated left atrial pressure. Right Ventricle: The right ventricular size is normal. No increase in right ventricular wall thickness. Right ventricular systolic function is normal. Tricuspid regurgitation signal is inadequate for assessing PA pressure. Left Atrium: Left atrial size was severely dilated. Right Atrium: Right atrial size was normal in size. Pericardium: Trivial pericardial effusion is present. Mitral Valve: The mitral valve is normal in structure. Trivial mitral valve regurgitation. Tricuspid Valve: The tricuspid valve is normal in structure. Tricuspid valve regurgitation is trivial. Aortic Valve: The aortic valve is tricuspid. Aortic valve regurgitation is trivial. Mild aortic stenosis is present. Aortic valve mean gradient measures 13.5 mmHg. Aortic valve peak gradient measures 24.6 mmHg. Aortic valve area, by VTI measures 1.57 cm. Pulmonic Valve: The pulmonic valve was not well visualized. Pulmonic valve regurgitation is not visualized. Aorta: The aortic root and ascending aorta are structurally normal, with no evidence of dilitation. IAS/Shunts: The interatrial septum was not well  visualized.  LEFT VENTRICLE PLAX 2D LVIDd:         3.70 cm  Diastology LVIDs:         2.20 cm  LV e' medial:    6.42 cm/s LV PW:         1.80 cm  LV E/e' medial:  19.9 LV IVS:        1.60 cm  LV e' lateral:   7.62 cm/s LVOT diam:     1.70 cm  LV E/e' lateral: 16.8 LV SV:         93 LV SV Index:   57 LVOT Area:     2.27 cm  IVC IVC diam: 2.00 cm LEFT ATRIUM             Index LA diam:        5.10 cm 3.11 cm/m LA Vol (A2C):   68.1 ml 41.58 ml/m LA Vol (A4C):   79.3 ml 48.42 ml/m LA Biplane Vol: 81.7 ml 49.88 ml/m  AORTIC VALVE AV Area (Vmax):    1.60 cm AV Area (Vmean):   1.72 cm AV Area (VTI):     1.57 cm AV Vmax:           248.00 cm/s AV Vmean:          173.000 cm/s AV VTI:            0.594 m AV Peak Grad:      24.6 mmHg AV Mean Grad:      13.5 mmHg LVOT Vmax:         175.00 cm/s LVOT Vmean:        131.000 cm/s LVOT  VTI:          0.410 m LVOT/AV VTI ratio: 0.69  AORTA Ao Root diam: 3.20 cm Ao Asc diam:  3.30 cm MITRAL VALVE MV Area (PHT): 2.62 cm     SHUNTS MV Decel Time: 289 msec     Systemic VTI:  0.41 m MV E velocity: 128.00 cm/s  Systemic Diam: 1.70 cm MV A velocity: 157.00 cm/s MV E/A ratio:  0.82 Oswaldo Milian MD Electronically signed by Oswaldo Milian MD Signature Date/Time: 06/03/2020/5:20:13 PM    Final        Discharge Exam: Vitals:   06/09/20 2030 06/10/20 0558  BP: (!) 138/51 (!) 132/48  Pulse:  80  Resp:  18  Temp:  99 F (37.2 C)  SpO2:  94%    General: Pt is alert, awake, not in acute distress Cardiovascular: RRR, S1/S2 +, + murmur, no edema Respiratory: CTA bilaterally, no wheezing, no rhonchi, no respiratory distress, no conversational dyspnea, on room air Abdominal: Soft, NT, ND, bowel sounds + Extremities: no edema, no cyanosis Psych: Normal mood and affect, stable judgement and insight     The results of significant diagnostics from this hospitalization (including imaging, microbiology, ancillary and laboratory) are listed below for reference.      Microbiology: Recent Results (from the past 240 hour(s))  Resp Panel by RT-PCR (Flu A&B, Covid) Nasopharyngeal Swab     Status: None   Collection Time: 06/03/20 12:48 AM   Specimen: Nasopharyngeal Swab; Nasopharyngeal(NP) swabs in vial transport medium  Result Value Ref Range Status   SARS Coronavirus 2 by RT PCR NEGATIVE NEGATIVE Final    Comment: (NOTE) SARS-CoV-2 target nucleic acids are NOT DETECTED.  The SARS-CoV-2 RNA is generally detectable in upper respiratory specimens during the acute phase of infection. The lowest concentration of SARS-CoV-2 viral copies this assay can detect is 138 copies/mL. A negative result does not preclude SARS-Cov-2 infection and should not be used as the sole basis for treatment or other patient management decisions. A negative result may occur with  improper specimen collection/handling, submission of specimen other than nasopharyngeal swab, presence of viral mutation(s) within the areas targeted by this assay, and inadequate number of viral copies(<138 copies/mL). A negative result must be combined with clinical observations, patient history, and epidemiological information. The expected result is Negative.  Fact Sheet for Patients:  EntrepreneurPulse.com.au  Fact Sheet for Healthcare Providers:  IncredibleEmployment.be  This test is no t yet approved or cleared by the [place] FDA and  has been authorized for detection and/or diagnosis of SARS-CoV-2 by FDA under an Emergency Use Authorization (EUA). This EUA will remain  in effect (meaning this test can be used) for the duration of the COVID-19 declaration under Section 564(b)(1) of the Act, 21 U.S.C.section 360bbb-3(b)(1), unless the authorization is terminated  or revoked sooner.       Influenza A by PCR NEGATIVE NEGATIVE Final   Influenza B by PCR NEGATIVE NEGATIVE Final    Comment: (NOTE) The Xpert Xpress SARS-CoV-2/FLU/RSV plus assay is  intended as an aid in the diagnosis of influenza from Nasopharyngeal swab specimens and should not be used as a sole basis for treatment. Nasal washings and aspirates are unacceptable for Xpert Xpress SARS-CoV-2/FLU/RSV testing.  Fact Sheet for Patients: EntrepreneurPulse.com.au  Fact Sheet for Healthcare Providers: IncredibleEmployment.be  This test is not yet approved or cleared by the [place] FDA and has been authorized for detection and/or diagnosis of SARS-CoV-2 by FDA under an Emergency Use Authorization (EUA).  This EUA will remain in effect (meaning this test can be used) for the duration of the COVID-19 declaration under Section 564(b)(1) of the Act, 21 U.S.C. section 360bbb-3(b)(1), unless the authorization is terminated or revoked.  Performed at [facility], [place] [address], [place], [place] [address]      Labs: BNP (last 3 results) Recent Labs    06/03/20 0044 06/05/20 1217  BNP 669.8* 774.1*   Basic Metabolic Panel: Recent Labs  Lab 06/06/20 0302 06/07/20 0326 06/08/20 0834 06/09/20 0547 06/10/20 0727  NA 136 136 137 136 136  K 3.9 4.2 3.8 3.7 3.5  CL 101 98 97* 97* 94*  CO2 _0 GLUCOSE 113* 173* 176* 108* 104*  BUN 76* 82* 84* 83* 85*  CREATININE 4.67* 4.29* 4.21* 4.43* 4.39*  CALCIUM 8.6* 8.9 8.7* 8.4* 8.7*  PHOS  --   --   --   --  5.3*   Liver Function Tests: Recent Labs  Lab 06/10/20 0727  ALBUMIN 3.3*   No results for input(s): LIPASE, AMYLASE in the last 168 hours. No results for input(s): AMMONIA in the last 168 hours. CBC: Recent Labs  Lab 06/05/20 0738 06/06/20 0653 06/08/20 0834 06/10/20 0727  WBC 6.4 7.2 6.6 5.5  HGB 7.9* 9.1* 8.6* 8.9*  HCT 23.7* 27.9* 26.3* 27.6*  MCV 96.7 97.2 94.3 92.0  PLT 278 289 239 240   Cardiac Enzymes: No results for input(s): CKTOTAL, CKMB, CKMBINDEX, TROPONINI in the last 168 hours. BNP: Invalid input(s): POCBNP CBG: Recent Labs   Lab 06/09/20 0554 06/09/20 1207 06/09/20 1617 06/09/20 2056 06/10/20 0555  GLUCAP 115* 176* 198* 166* 117*   D-Dimer No results for input(s): DDIMER in the last 72 hours. Hgb A1c No results for input(s): HGBA1C in the last 72 hours. Lipid Profile No results for input(s): CHOL, HDL, LDLCALC, TRIG, CHOLHDL, LDLDIRECT in the last 72 hours. Thyroid function studies No results for input(s): TSH, T4TOTAL, T3FREE, THYROIDAB in the last 72 hours.  Invalid input(s): FREET3 Anemia work up Recent Labs    06/08/20 0834  FERRITIN 290  TIBC 223*  IRON 36   Urinalysis    Component Value Date/Time   COLORURINE YELLOW 06/07/2020 0041   APPEARANCEUR CLEAR 06/07/2020 0041   LABSPEC 1.008 06/07/2020 0041   PHURINE 5.0 06/07/2020 0041   GLUCOSEU NEGATIVE 06/07/2020 0041   GLUCOSEU NEGATIVE 09/04/2009 0721   HGBUR NEGATIVE 06/07/2020 0041   BILIRUBINUR NEGATIVE 06/07/2020 0041   BILIRUBINUR negative 09/24/2019 1003   KETONESUR NEGATIVE 06/07/2020 0041   PROTEINUR 100 (A) 06/07/2020 0041   UROBILINOGEN 0.2 09/24/2019 1003   UROBILINOGEN 0.2 09/04/2009 0721   NITRITE NEGATIVE 06/07/2020 0041   LEUKOCYTESUR NEGATIVE 06/07/2020 0041   Sepsis Labs Invalid input(s): PROCALCITONIN,  WBC,  LACTICIDVEN Microbiology Recent Results (from the past 240 hour(s))  Resp Panel by RT-PCR (Flu A&B, Covid) Nasopharyngeal Swab     Status: None   Collection Time: 06/03/20 12:48 AM   Specimen: Nasopharyngeal Swab; Nasopharyngeal(NP) swabs in vial transport medium  Result Value Ref Range Status   SARS Coronavirus 2 by RT PCR NEGATIVE NEGATIVE Final    Comment: (NOTE) SARS-CoV-2 target nucleic acids are NOT DETECTED.  The SARS-CoV-2 RNA is generally detectable in upper respiratory specimens during the acute phase of infection. The lowest concentration of SARS-CoV-2 viral copies this assay can detect is 138 copies/mL. A negative result does not preclude SARS-Cov-2 infection and should not be used as  the sole basis for  treatment or other patient management decisions. A negative result may occur with  improper specimen collection/handling, submission of specimen other than nasopharyngeal swab, presence of viral mutation(s) within the areas targeted by this assay, and inadequate number of viral copies(<138 copies/mL). A negative result must be combined with clinical observations, patient history, and epidemiological information. The expected result is Negative.  Fact Sheet for Patients:  EntrepreneurPulse.com.au  Fact Sheet for Healthcare Providers:  IncredibleEmployment.be  This test is no t yet approved or cleared by the [place] FDA and  has been authorized for detection and/or diagnosis of SARS-CoV-2 by FDA under an Emergency Use Authorization (EUA). This EUA will remain  in effect (meaning this test can be used) for the duration of the COVID-19 declaration under Section 564(b)(1) of the Act, 21 U.S.C.section 360bbb-3(b)(1), unless the authorization is terminated  or revoked sooner.       Influenza A by PCR NEGATIVE NEGATIVE Final   Influenza B by PCR NEGATIVE NEGATIVE Final    Comment: (NOTE) The Xpert Xpress SARS-CoV-2/FLU/RSV plus assay is intended as an aid in the diagnosis of influenza from Nasopharyngeal swab specimens and should not be used as a sole basis for treatment. Nasal washings and aspirates are unacceptable for Xpert Xpress SARS-CoV-2/FLU/RSV testing.  Fact Sheet for Patients: EntrepreneurPulse.com.au  Fact Sheet for Healthcare Providers: IncredibleEmployment.be  This test is not yet approved or cleared by the [place] FDA and has been authorized for detection and/or diagnosis of SARS-CoV-2 by FDA under an Emergency Use Authorization (EUA). This EUA will remain in effect (meaning this test can be used) for the duration of the COVID-19 declaration under Section 564(b)(1) of  the Act, 21 U.S.C. section 360bbb-3(b)(1), unless the authorization is terminated or revoked.  Performed at [facility], [employer] [address], [place], [place] [address]      Patient was seen and examined on the day of discharge and was found to be in stable condition. Time coordinating discharge: 25 minutes including assessment and coordination of care, as well as examination of the patient.   SIGNED:  Dessa Phi, DO [facility] 06/10/2020, 10:33 AM

## 2020-06-10 NOTE — Progress Notes (Signed)
Nephrology Follow-Up Consult note   Assessment/Recommendations: [name] is a/an 70 y.o. female with a past medical history significant for HTN, HLD, CVA, DM 2, CKD, CHF, admitted for CHF exacerbation.       Non-Oliguric AKI on CKD 4: Likely secondary to cardiorenal syndrome.  Follows with Dr. Joelyn Oms outpatient.  Volume status overall improved.  Creatinine above baseline but stable at 4.4 -She seems to be stable on torsemide 80 mg twice daily.  She was slightly net negative over the past 24 hours on this regimen but I think it is a good home regimen as she will likely be eating more salt and drinking more water at home. -She is stable to discharge from nephrology standpoint.  I will work on getting her an office visit with [place] next week and labs next week as well -Hypertension CHF management as below -Continue to monitor daily Cr, Dose meds for GFR -Monitor Daily I/Os, Daily weight  -Maintain MAP>65 for optimal renal perfusion.  -Avoid nephrotoxic medications including NSAIDs and Vanc/Zosyn combo  CHF exacerbation: Likely hypertensive heart disease with decreased renal function at baseline and can volume status difficult.  Cardiology following.  Some concern for cardiac amyloid with plans to work-up outpatient.   Hypertension: Must be severe and difficult to control hypertension on hydralazine and minoxidil.  Can maintain current medications but one should monitor closely for pericardial effusion when taking minoxidil especially in combination with hydralazine.  Anemia: likely multifactorial. On iron and epo as an outpatient. receiving retacrit monthly, may need a dose of esa here if she stays in house.  Iron saturation and ferritin were low.  Iron 250 mg ordered x4 doses.  Would receive third dose today.  Does not need to remain inpatient to receive fourth dose  Uncontrolled Diabetes Mellitus Type 2 with Hyperglycemia: mgmt per primary  Recommendations conveyed to primary service.     [facility] 06/10/2020 9:51 AM  ___________________________________________________________  CC: AKI on CKD  Interval History/Subjective: Patient states she feels well today.  Urinating without issues.  Denies dizziness or lightheadedness.  Weight is 54.6 kg   Medications:  Current Facility-Administered Medications  Medication Dose Route Frequency Provider Last Rate Last Admin  . 0.9 %  sodium chloride infusion  250 mL Intravenous PRN Shalhoub, Sherryll Burger, MD      . acetaminophen (TYLENOL) tablet 650 mg  650 mg Oral Q4H PRN Shalhoub, Sherryll Burger, MD      . allopurinol (ZYLOPRIM) tablet 100 mg  100 mg Oral Daily Shalhoub, Sherryll Burger, MD   100 mg at 06/09/20 1058  . amLODipine (NORVASC) tablet 10 mg  10 mg Oral Daily Shalhoub, Sherryll Burger, MD   10 mg at 06/09/20 1057  . atorvastatin (LIPITOR) tablet 80 mg  80 mg Oral Daily Shalhoub, Sherryll Burger, MD   80 mg at 06/09/20 1058  . bromocriptine (PARLODEL) tablet 2.5 mg  2.5 mg Oral Daily Shalhoub, Sherryll Burger, MD   2.5 mg at 06/09/20 1059  . enoxaparin (LOVENOX) injection 30 mg  30 mg Subcutaneous Daily Shalhoub, Sherryll Burger, MD   30 mg at 06/09/20 1057  . ferric gluconate (FERRLECIT) 250 mg in sodium chloride 0.9 % 100 mL IVPB  250 mg Intravenous Daily Reesa Chew, MD   Stopped at 06/09/20 1513  . hydrALAZINE (APRESOLINE) tablet 50 mg  50 mg Oral Q8H Debbe Odea, MD   50 mg at 06/10/20 0624  . insulin aspart (novoLOG) injection 0-9 Units  0-9  Units Subcutaneous TID AC & HS Shalhoub, Sherryll Burger, MD   2 Units at 06/09/20 2121  . isosorbide mononitrate (IMDUR) 24 hr tablet 30 mg  30 mg Oral Daily Debbe Odea, MD   30 mg at 06/09/20 1058  . metoprolol succinate (TOPROL-XL) 24 hr tablet 100 mg  100 mg Oral Daily Shalhoub, Sherryll Burger, MD   100 mg at 06/09/20 1057  . minoxidil (LONITEN) tablet 10 mg  10 mg Oral BID Vernelle Emerald, MD   10 mg at 06/09/20 2030  . ondansetron (ZOFRAN) injection 4 mg  4 mg Intravenous Q6H PRN  Shalhoub, Sherryll Burger, MD   4 mg at 06/10/20 0733  . pantoprazole (PROTONIX) EC tablet 40 mg  40 mg Oral Daily Shalhoub, Sherryll Burger, MD   40 mg at 06/09/20 1058  . polyethylene glycol (MIRALAX / GLYCOLAX) packet 17 g  17 g Oral Daily Debbe Odea, MD   17 g at 06/08/20 1042  . sodium chloride flush (NS) 0.9 % injection 3 mL  3 mL Intravenous Q12H Shalhoub, Sherryll Burger, MD   3 mL at 06/09/20 2031  . sodium chloride flush (NS) 0.9 % injection 3 mL  3 mL Intravenous PRN Shalhoub, Sherryll Burger, MD      . torsemide (DEMADEX) tablet 80 mg  80 mg Oral BID Reesa Chew, MD   80 mg at 06/10/20 4315      Review of Systems: 10 systems reviewed and negative except per interval history/subjective  Physical Exam: Vitals:   06/09/20 2030 06/10/20 0558  BP: (!) 138/51 (!) 132/48  Pulse:  80  Resp:  18  Temp:  99 F (37.2 C)  SpO2:  94%   No intake/output data recorded.  Intake/Output Summary (Last 24 hours) at 06/10/2020 0951 Last data filed at 06/10/2020 0504 Gross per 24 hour  Intake 600 ml  Output 1200 ml  Net -600 ml   Constitutional: well-appearing, no acute distress ENMT: ears and nose without scars or lesions, MMM CV: normal rate, no edema Respiratory: Bilateral chest rise normal work of breathing Gastrointestinal: soft, non-tender, no palpable masses or hernias Skin: no visible lesions or rashes Psych: alert, judgement/insight appropriate, appropriate mood and affect   Test Results I personally reviewed new and old clinical labs and radiology tests Lab Results  Component Value Date   NA 136 06/10/2020   K 3.5 06/10/2020   CL 94 (L) 06/10/2020   CO2 27 06/10/2020   BUN 85 (H) 06/10/2020   CREATININE 4.39 (H) 06/10/2020   GFR 25.74 (L) 07/11/2018   CALCIUM 8.7 (L) 06/10/2020   ALBUMIN 3.3 (L) 06/10/2020   PHOS 5.3 (H) 06/10/2020

## 2020-06-10 NOTE — Progress Notes (Signed)
D/C instructions given and reviewed. IV iron infusing at this time.

## 2020-06-12 DIAGNOSIS — M1712 Unilateral primary osteoarthritis, left knee: Secondary | ICD-10-CM | POA: Diagnosis not present

## 2020-06-12 DIAGNOSIS — I129 Hypertensive chronic kidney disease with stage 1 through stage 4 chronic kidney disease, or unspecified chronic kidney disease: Secondary | ICD-10-CM | POA: Diagnosis not present

## 2020-06-12 DIAGNOSIS — N184 Chronic kidney disease, stage 4 (severe): Secondary | ICD-10-CM | POA: Diagnosis not present

## 2020-06-12 DIAGNOSIS — E875 Hyperkalemia: Secondary | ICD-10-CM | POA: Diagnosis not present

## 2020-06-12 DIAGNOSIS — D631 Anemia in chronic kidney disease: Secondary | ICD-10-CM | POA: Diagnosis not present

## 2020-06-12 DIAGNOSIS — N179 Acute kidney failure, unspecified: Secondary | ICD-10-CM | POA: Diagnosis not present

## 2020-06-12 DIAGNOSIS — G8929 Other chronic pain: Secondary | ICD-10-CM | POA: Diagnosis not present

## 2020-06-12 DIAGNOSIS — M5416 Radiculopathy, lumbar region: Secondary | ICD-10-CM | POA: Diagnosis not present

## 2020-06-12 DIAGNOSIS — E785 Hyperlipidemia, unspecified: Secondary | ICD-10-CM | POA: Diagnosis not present

## 2020-06-12 DIAGNOSIS — E1165 Type 2 diabetes mellitus with hyperglycemia: Secondary | ICD-10-CM | POA: Diagnosis not present

## 2020-06-12 DIAGNOSIS — M109 Gout, unspecified: Secondary | ICD-10-CM | POA: Diagnosis not present

## 2020-06-12 DIAGNOSIS — E1122 Type 2 diabetes mellitus with diabetic chronic kidney disease: Secondary | ICD-10-CM | POA: Diagnosis not present

## 2020-06-12 DIAGNOSIS — K5900 Constipation, unspecified: Secondary | ICD-10-CM | POA: Diagnosis not present

## 2020-06-15 DIAGNOSIS — I129 Hypertensive chronic kidney disease with stage 1 through stage 4 chronic kidney disease, or unspecified chronic kidney disease: Secondary | ICD-10-CM | POA: Diagnosis not present

## 2020-06-15 DIAGNOSIS — E875 Hyperkalemia: Secondary | ICD-10-CM | POA: Diagnosis not present

## 2020-06-15 DIAGNOSIS — G8929 Other chronic pain: Secondary | ICD-10-CM | POA: Diagnosis not present

## 2020-06-15 DIAGNOSIS — M109 Gout, unspecified: Secondary | ICD-10-CM | POA: Diagnosis not present

## 2020-06-15 DIAGNOSIS — E1165 Type 2 diabetes mellitus with hyperglycemia: Secondary | ICD-10-CM | POA: Diagnosis not present

## 2020-06-15 DIAGNOSIS — N184 Chronic kidney disease, stage 4 (severe): Secondary | ICD-10-CM | POA: Diagnosis not present

## 2020-06-15 DIAGNOSIS — K5900 Constipation, unspecified: Secondary | ICD-10-CM | POA: Diagnosis not present

## 2020-06-15 DIAGNOSIS — M5416 Radiculopathy, lumbar region: Secondary | ICD-10-CM | POA: Diagnosis not present

## 2020-06-15 DIAGNOSIS — N179 Acute kidney failure, unspecified: Secondary | ICD-10-CM | POA: Diagnosis not present

## 2020-06-15 DIAGNOSIS — E1122 Type 2 diabetes mellitus with diabetic chronic kidney disease: Secondary | ICD-10-CM | POA: Diagnosis not present

## 2020-06-15 DIAGNOSIS — M1712 Unilateral primary osteoarthritis, left knee: Secondary | ICD-10-CM | POA: Diagnosis not present

## 2020-06-15 DIAGNOSIS — D631 Anemia in chronic kidney disease: Secondary | ICD-10-CM | POA: Diagnosis not present

## 2020-06-15 DIAGNOSIS — E785 Hyperlipidemia, unspecified: Secondary | ICD-10-CM | POA: Diagnosis not present

## 2020-06-16 DIAGNOSIS — I129 Hypertensive chronic kidney disease with stage 1 through stage 4 chronic kidney disease, or unspecified chronic kidney disease: Secondary | ICD-10-CM | POA: Diagnosis not present

## 2020-06-16 DIAGNOSIS — E1122 Type 2 diabetes mellitus with diabetic chronic kidney disease: Secondary | ICD-10-CM | POA: Diagnosis not present

## 2020-06-16 DIAGNOSIS — K648 Other hemorrhoids: Secondary | ICD-10-CM | POA: Diagnosis not present

## 2020-06-16 DIAGNOSIS — M109 Gout, unspecified: Secondary | ICD-10-CM | POA: Diagnosis not present

## 2020-06-16 DIAGNOSIS — E785 Hyperlipidemia, unspecified: Secondary | ICD-10-CM | POA: Diagnosis not present

## 2020-06-16 DIAGNOSIS — N184 Chronic kidney disease, stage 4 (severe): Secondary | ICD-10-CM | POA: Diagnosis not present

## 2020-06-16 DIAGNOSIS — D631 Anemia in chronic kidney disease: Secondary | ICD-10-CM | POA: Diagnosis not present

## 2020-06-16 DIAGNOSIS — N179 Acute kidney failure, unspecified: Secondary | ICD-10-CM | POA: Diagnosis not present

## 2020-06-16 DIAGNOSIS — G8929 Other chronic pain: Secondary | ICD-10-CM | POA: Diagnosis not present

## 2020-06-16 DIAGNOSIS — D125 Benign neoplasm of sigmoid colon: Secondary | ICD-10-CM | POA: Diagnosis not present

## 2020-06-16 DIAGNOSIS — E1165 Type 2 diabetes mellitus with hyperglycemia: Secondary | ICD-10-CM | POA: Diagnosis not present

## 2020-06-16 DIAGNOSIS — K5900 Constipation, unspecified: Secondary | ICD-10-CM | POA: Diagnosis not present

## 2020-06-16 DIAGNOSIS — E875 Hyperkalemia: Secondary | ICD-10-CM | POA: Diagnosis not present

## 2020-06-16 DIAGNOSIS — D62 Acute posthemorrhagic anemia: Secondary | ICD-10-CM | POA: Diagnosis not present

## 2020-06-16 DIAGNOSIS — M1712 Unilateral primary osteoarthritis, left knee: Secondary | ICD-10-CM | POA: Diagnosis not present

## 2020-06-16 DIAGNOSIS — D500 Iron deficiency anemia secondary to blood loss (chronic): Secondary | ICD-10-CM | POA: Diagnosis not present

## 2020-06-16 DIAGNOSIS — K649 Unspecified hemorrhoids: Secondary | ICD-10-CM | POA: Diagnosis not present

## 2020-06-16 DIAGNOSIS — M5416 Radiculopathy, lumbar region: Secondary | ICD-10-CM | POA: Diagnosis not present

## 2020-06-16 DIAGNOSIS — K635 Polyp of colon: Secondary | ICD-10-CM | POA: Diagnosis not present

## 2020-06-17 DIAGNOSIS — N179 Acute kidney failure, unspecified: Secondary | ICD-10-CM | POA: Diagnosis not present

## 2020-06-17 DIAGNOSIS — M5416 Radiculopathy, lumbar region: Secondary | ICD-10-CM | POA: Diagnosis not present

## 2020-06-17 DIAGNOSIS — G8929 Other chronic pain: Secondary | ICD-10-CM | POA: Diagnosis not present

## 2020-06-17 DIAGNOSIS — I129 Hypertensive chronic kidney disease with stage 1 through stage 4 chronic kidney disease, or unspecified chronic kidney disease: Secondary | ICD-10-CM | POA: Diagnosis not present

## 2020-06-17 DIAGNOSIS — M109 Gout, unspecified: Secondary | ICD-10-CM | POA: Diagnosis not present

## 2020-06-17 DIAGNOSIS — E1165 Type 2 diabetes mellitus with hyperglycemia: Secondary | ICD-10-CM | POA: Diagnosis not present

## 2020-06-17 DIAGNOSIS — K5900 Constipation, unspecified: Secondary | ICD-10-CM | POA: Diagnosis not present

## 2020-06-17 DIAGNOSIS — N185 Chronic kidney disease, stage 5: Secondary | ICD-10-CM | POA: Diagnosis not present

## 2020-06-17 DIAGNOSIS — D631 Anemia in chronic kidney disease: Secondary | ICD-10-CM | POA: Diagnosis not present

## 2020-06-17 DIAGNOSIS — N184 Chronic kidney disease, stage 4 (severe): Secondary | ICD-10-CM | POA: Diagnosis not present

## 2020-06-17 DIAGNOSIS — E785 Hyperlipidemia, unspecified: Secondary | ICD-10-CM | POA: Diagnosis not present

## 2020-06-17 DIAGNOSIS — M1712 Unilateral primary osteoarthritis, left knee: Secondary | ICD-10-CM | POA: Diagnosis not present

## 2020-06-17 DIAGNOSIS — E875 Hyperkalemia: Secondary | ICD-10-CM | POA: Diagnosis not present

## 2020-06-17 DIAGNOSIS — E1122 Type 2 diabetes mellitus with diabetic chronic kidney disease: Secondary | ICD-10-CM | POA: Diagnosis not present

## 2020-06-17 DIAGNOSIS — I120 Hypertensive chronic kidney disease with stage 5 chronic kidney disease or end stage renal disease: Secondary | ICD-10-CM | POA: Diagnosis not present

## 2020-06-17 DIAGNOSIS — R809 Proteinuria, unspecified: Secondary | ICD-10-CM | POA: Diagnosis not present

## 2020-06-17 DIAGNOSIS — N2581 Secondary hyperparathyroidism of renal origin: Secondary | ICD-10-CM | POA: Diagnosis not present

## 2020-06-18 ENCOUNTER — Other Ambulatory Visit: Payer: Self-pay | Admitting: *Deleted

## 2020-06-18 DIAGNOSIS — M1712 Unilateral primary osteoarthritis, left knee: Secondary | ICD-10-CM | POA: Diagnosis not present

## 2020-06-18 DIAGNOSIS — N184 Chronic kidney disease, stage 4 (severe): Secondary | ICD-10-CM

## 2020-06-18 DIAGNOSIS — E1165 Type 2 diabetes mellitus with hyperglycemia: Secondary | ICD-10-CM | POA: Diagnosis not present

## 2020-06-18 DIAGNOSIS — K5900 Constipation, unspecified: Secondary | ICD-10-CM | POA: Diagnosis not present

## 2020-06-18 DIAGNOSIS — M5416 Radiculopathy, lumbar region: Secondary | ICD-10-CM | POA: Diagnosis not present

## 2020-06-18 DIAGNOSIS — N179 Acute kidney failure, unspecified: Secondary | ICD-10-CM | POA: Diagnosis not present

## 2020-06-18 DIAGNOSIS — M109 Gout, unspecified: Secondary | ICD-10-CM | POA: Diagnosis not present

## 2020-06-18 DIAGNOSIS — E1122 Type 2 diabetes mellitus with diabetic chronic kidney disease: Secondary | ICD-10-CM | POA: Diagnosis not present

## 2020-06-18 DIAGNOSIS — E785 Hyperlipidemia, unspecified: Secondary | ICD-10-CM | POA: Diagnosis not present

## 2020-06-18 DIAGNOSIS — I129 Hypertensive chronic kidney disease with stage 1 through stage 4 chronic kidney disease, or unspecified chronic kidney disease: Secondary | ICD-10-CM | POA: Diagnosis not present

## 2020-06-18 DIAGNOSIS — D631 Anemia in chronic kidney disease: Secondary | ICD-10-CM | POA: Diagnosis not present

## 2020-06-18 DIAGNOSIS — E875 Hyperkalemia: Secondary | ICD-10-CM | POA: Diagnosis not present

## 2020-06-18 DIAGNOSIS — G8929 Other chronic pain: Secondary | ICD-10-CM | POA: Diagnosis not present

## 2020-06-19 ENCOUNTER — Other Ambulatory Visit: Payer: Self-pay

## 2020-06-19 ENCOUNTER — Encounter: Payer: Medicare Other | Attending: Endocrinology | Admitting: Dietician

## 2020-06-19 ENCOUNTER — Encounter: Payer: Self-pay | Admitting: Dietician

## 2020-06-19 DIAGNOSIS — E1122 Type 2 diabetes mellitus with diabetic chronic kidney disease: Secondary | ICD-10-CM | POA: Insufficient documentation

## 2020-06-19 DIAGNOSIS — E113599 Type 2 diabetes mellitus with proliferative diabetic retinopathy without macular edema, unspecified eye: Secondary | ICD-10-CM | POA: Insufficient documentation

## 2020-06-19 DIAGNOSIS — N185 Chronic kidney disease, stage 5: Secondary | ICD-10-CM | POA: Insufficient documentation

## 2020-06-19 NOTE — Progress Notes (Signed)
Diabetes Self-Management Education  Visit Type: First/Initial  Appt. Start Time: 0915 Appt. End Time: 1015  06/19/2020  [name], identified by name and date of birth, is a 70 y.o. female with a diagnosis of Diabetes: Type 2.   ASSESSMENT Patient is here today alone.  She would like to learn more about diabetes. Also, she has stage 5 CKD and states that she was told that she needs to go on dialysis.  She was to have cataract surgery last week but was hospitalized with CHF. Poor appetite at times and drinks Ensure twice per week.  History includes Type 2 Diabetes (2007), CKD stage 5 with proteinuria (dialysis needs per MD), HTN, CVA, GERD, CHF, gout Medications include acarbose, repaglinide Labs:BUN 85, Creatinine 4.39, Potassium 3.5, eGFR 10 with proteinuria 06/10/2020, A1C 6.2% 06/03/2020, Fructosamine 354 04/23/2020  Weight hx: 127 lbs today 150 lbs UBW 2 years ago prior to worsening kidney function  Estimated needs (without dialysis): 1700 calories per day 45 grams protein per day 2000 mg sodium or less daily  Patient lives alone.  Patient drives and does all of her shopping and cooking.  She has support to take her to dialysis when she needs this (son and sister).  Patient has PT to come to the house twice per week.  A nurse visits once per week.  She was told to weight herself daily at home but does not own a scale. Poor appetite at times. Reduced amount of red meat.  No fish as she had gout the last time. Does not tolerate cheese well. She avoids all added salt.  Height 5\' 2"  (1.575 m), weight 127 lb (57.6 kg). Body mass index is 23.23 kg/m.   Diabetes Self-Management Education - 06/19/20 0926      Visit Information   Visit Type First/Initial      Initial Visit   Diabetes Type Type 2    Are you currently following a meal plan? No    Are you taking your medications as prescribed? Yes    Date Diagnosed 2007      Health Coping   How would you rate your  overall health? Fair      Psychosocial Assessment   Patient Belief/Attitude about Diabetes Motivated to manage diabetes    Self-care barriers None    Self-management support Doctor's office    Other persons present Patient    Patient Concerns Nutrition/Meal planning    Special Needs None    Preferred Learning Style No preference indicated    Learning Readiness Ready    How often do you need to have someone help you when you read instructions, pamphlets, or other written materials from your doctor or pharmacy? 1 - Never    What is the last grade level you completed in school? 11      Pre-Education Assessment   Patient understands the diabetes disease and treatment process. Needs Instruction    Patient understands incorporating nutritional management into lifestyle. Needs Instruction    Patient undertands incorporating physical activity into lifestyle. Needs Instruction    Patient understands using medications safely. Needs Instruction    Patient understands monitoring blood glucose, interpreting and using results Needs Instruction    Patient understands prevention, detection, and treatment of acute complications. Needs Instruction    Patient understands prevention, detection, and treatment of chronic complications. Needs Instruction    Patient understands how to develop strategies to address psychosocial issues. Needs Instruction    Patient understands how to develop strategies  to promote health/change behavior. Needs Instruction      Complications   Last HgB A1C per patient/outside source 6.2 %   06/03/20   How often do you check your blood sugar? 1-2 times/day    Fasting Blood glucose range (mg/dL) 130-179   98-140   Number of hypoglycemic episodes per month 0    Number of hyperglycemic episodes per week 0    Have you had a dilated eye exam in the past 12 months? Yes    Have you had a dental exam in the past 12 months? No    Are you checking your feet? Yes    How many days per week  are you checking your feet? 5      Dietary Intake   Breakfast banana sandwich OR cereal (frosted flakes or plain cheerios), 2% milk    Snack (morning) sugar free popsicle    Lunch spinach salad, lettuce, green peppers, cucumber, tomatoes, occasiona boiled chicken, unsalted top saltines    Snack (afternoon) occasional apple    Dinner broccoli, boiled chicken, rice    Snack (evening) protein bar    Beverage(s) water, occasional black coffee, Ensure 2 times per week, rare diet sprite      Exercise   Exercise Type Light (walking / raking leaves)   PT   How many days per week to you exercise? 2    How many minutes per day do you exercise? 45    Total minutes per week of exercise 90      Patient Education   Previous Diabetes Education No    Nutrition management  Other (comment);Food label reading, portion sizes and measuring food.   renal without HD   Physical activity and exercise  Other (comment)   encouraged continued PT   Medications Reviewed patients medication for diabetes, action, purpose, timing of dose and side effects.    Monitoring Identified appropriate SMBG and/or A1C goals.    Psychosocial adjustment Identified and addressed patients feelings and concerns about diabetes      Individualized Goals (developed by patient)   Nutrition Other (comment)   renal ADA   Physical Activity Exercise 1-2 times per week;45 minutes per day    Monitoring  test my blood glucose as discussed    Reducing Risk do foot checks daily;increase portions of healthy fats      Post-Education Assessment   Patient understands the diabetes disease and treatment process. Demonstrates understanding / competency    Patient understands incorporating nutritional management into lifestyle. Needs Review    Patient undertands incorporating physical activity into lifestyle. Demonstrates understanding / competency    Patient understands using medications safely. Demonstrates understanding / competency    Patient  understands monitoring blood glucose, interpreting and using results Demonstrates understanding / competency    Patient understands prevention, detection, and treatment of acute complications. Demonstrates understanding / competency    Patient understands prevention, detection, and treatment of chronic complications. Demonstrates understanding / competency    Patient understands how to develop strategies to address psychosocial issues. Demonstrates understanding / competency    Patient understands how to develop strategies to promote health/change behavior. Demonstrates understanding / competency      Outcomes   Expected Outcomes Demonstrated interest in learning. Expect positive outcomes    Future DMSE PRN    Program Status Completed           Individualized Plan for Diabetes Self-Management Training:   Learning Objective:  Patient will have a greater understanding of diabetes  self-management. Patient education plan is to attend individual and/or group sessions per assessed needs and concerns.   Plan:   Patient Instructions  Continue to work with Physical Therapy.  Continue to avoid added salt and choose fresh foods.  Ensure is fine if you are having problems eating.  Currently you do not need to restrict potassium as your blood potassium is normal.  Continue to choose only small amounts of meat.   Expected Outcomes:  Demonstrated interest in learning. Expect positive outcomes  Education material provided: ADA - How to Thrive: A Guide for Your Journey with Diabetes and Food label handouts; NKD National Kidney diet placemat for those not on dialysis  If problems or questions, patient to contact team via:  Phone  Future DSME appointment: PRN

## 2020-06-19 NOTE — Patient Instructions (Signed)
Continue to work with Physical Therapy.  Continue to avoid added salt and choose fresh foods.  Ensure is fine if you are having problems eating.  Currently you do not need to restrict potassium as your blood potassium is normal.  Continue to choose only small amounts of meat.

## 2020-06-22 ENCOUNTER — Other Ambulatory Visit: Payer: Self-pay | Admitting: Family

## 2020-06-23 DIAGNOSIS — M109 Gout, unspecified: Secondary | ICD-10-CM | POA: Diagnosis not present

## 2020-06-23 DIAGNOSIS — N179 Acute kidney failure, unspecified: Secondary | ICD-10-CM | POA: Diagnosis not present

## 2020-06-23 DIAGNOSIS — K5900 Constipation, unspecified: Secondary | ICD-10-CM | POA: Diagnosis not present

## 2020-06-23 DIAGNOSIS — M5416 Radiculopathy, lumbar region: Secondary | ICD-10-CM | POA: Diagnosis not present

## 2020-06-23 DIAGNOSIS — I129 Hypertensive chronic kidney disease with stage 1 through stage 4 chronic kidney disease, or unspecified chronic kidney disease: Secondary | ICD-10-CM | POA: Diagnosis not present

## 2020-06-23 DIAGNOSIS — E1122 Type 2 diabetes mellitus with diabetic chronic kidney disease: Secondary | ICD-10-CM | POA: Diagnosis not present

## 2020-06-23 DIAGNOSIS — E1165 Type 2 diabetes mellitus with hyperglycemia: Secondary | ICD-10-CM | POA: Diagnosis not present

## 2020-06-23 DIAGNOSIS — G8929 Other chronic pain: Secondary | ICD-10-CM | POA: Diagnosis not present

## 2020-06-23 DIAGNOSIS — N184 Chronic kidney disease, stage 4 (severe): Secondary | ICD-10-CM | POA: Diagnosis not present

## 2020-06-23 DIAGNOSIS — M1712 Unilateral primary osteoarthritis, left knee: Secondary | ICD-10-CM | POA: Diagnosis not present

## 2020-06-23 DIAGNOSIS — E875 Hyperkalemia: Secondary | ICD-10-CM | POA: Diagnosis not present

## 2020-06-23 DIAGNOSIS — D631 Anemia in chronic kidney disease: Secondary | ICD-10-CM | POA: Diagnosis not present

## 2020-06-23 DIAGNOSIS — E785 Hyperlipidemia, unspecified: Secondary | ICD-10-CM | POA: Diagnosis not present

## 2020-06-24 ENCOUNTER — Ambulatory Visit (INDEPENDENT_AMBULATORY_CARE_PROVIDER_SITE_OTHER): Payer: Medicare Other | Admitting: Endocrinology

## 2020-06-24 ENCOUNTER — Ambulatory Visit: Payer: Medicare Other | Admitting: Podiatry

## 2020-06-24 ENCOUNTER — Encounter: Payer: Self-pay | Admitting: Podiatry

## 2020-06-24 ENCOUNTER — Other Ambulatory Visit: Payer: Self-pay

## 2020-06-24 VITALS — BP 148/52 | HR 77 | Ht 62.0 in | Wt 128.6 lb

## 2020-06-24 DIAGNOSIS — E113599 Type 2 diabetes mellitus with proliferative diabetic retinopathy without macular edema, unspecified eye: Secondary | ICD-10-CM | POA: Diagnosis not present

## 2020-06-24 DIAGNOSIS — M79675 Pain in left toe(s): Secondary | ICD-10-CM | POA: Diagnosis not present

## 2020-06-24 DIAGNOSIS — N184 Chronic kidney disease, stage 4 (severe): Secondary | ICD-10-CM | POA: Diagnosis not present

## 2020-06-24 DIAGNOSIS — E1122 Type 2 diabetes mellitus with diabetic chronic kidney disease: Secondary | ICD-10-CM | POA: Diagnosis not present

## 2020-06-24 DIAGNOSIS — E1151 Type 2 diabetes mellitus with diabetic peripheral angiopathy without gangrene: Secondary | ICD-10-CM

## 2020-06-24 DIAGNOSIS — B351 Tinea unguium: Secondary | ICD-10-CM | POA: Diagnosis not present

## 2020-06-24 DIAGNOSIS — M79674 Pain in right toe(s): Secondary | ICD-10-CM

## 2020-06-24 LAB — POCT GLYCOSYLATED HEMOGLOBIN (HGB A1C): Hemoglobin A1C: 6 % — AB (ref 4.0–5.6)

## 2020-06-24 NOTE — Progress Notes (Signed)
Subjective:    Patient ID: [name], female    DOB: 1950-05-02, 70 y.o.   MRN: [id number]  HPI Pt returns for f/u of diabetes mellitus:  DM type: 2, but she may be developing type 1.   Dx'ed: 2778 Complications: polyneuropathy, stage 5 CRI, CVA's, and PDR.   Therapy: 3 oral meds GDM: never DKA: never Severe hypoglycemia: once (2021).   Pancreatitis: never Pancreatic imaging: never. Other: she cannot afford brand-name meds; she has never been on insulin; edema limits rx options; fructosamine indicates much higher avg glucose than A1c.   Interval history: no cbg record, but states cbg's are in the low to mid-100's.  pt states she feels well in general.   She again declines to start insulin.   Past Medical History:  Diagnosis Date  . Acute kidney injury ([place]) 01/08/2015  . Arthritis   . Chronic back pain   . Constipation   . Cough   . Diabetes mellitus, type 2 ([place])   . Diverticulosis   . Fibroid    patient thinks this was the reason for her hysterectomy  . GERD (gastroesophageal reflux disease)   . Heart murmur   . History of sebaceous cyst   . Hyperlipemia   . Hyperplastic colon polyp   . Hypertension   . Hyperthyroidism   . Lumbar radiculopathy   . Shortness of breath 09/13/2013  . Stroke ([place]) 2015?   x4   . Tobacco abuse   . 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 ABDOMINAL HYSTERECTOMY     --?ovaries remain  . COLONOSCOPY W/ BIOPSIES  09/11/2008  . ESOPHAGOGASTRODUODENOSCOPY (EGD) WITH PROPOFOL N/A 05/20/2020   Procedure: ESOPHAGOGASTRODUODENOSCOPY (EGD) WITH PROPOFOL;  Surgeon: Irene Shipper, MD;  Location: [facility] ENDOSCOPY;  Service: Endoscopy;  Laterality: N/A;  . RETINOPATHY SURGERY Bilateral 2013   Dr. Zigmund Daniel    Social History   Socioeconomic History  . Marital status: Divorced    Spouse name: Not on file  . Number of children: 1  . Years of education: 44  .  Highest education level: Not on file  Occupational History  . 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 Smoking status: Former Smoker    Packs/day: 0.50    Years: 35.00    Pack years: 17.50    Types: Cigarettes    Quit date: 07/07/2014    Years since quitting: 5.9  . Smokeless tobacco: Never Used  Vaping Use  . Vaping Use: Never used  Substance and Sexual Activity  . Alcohol use: No  . Drug use: No  . Sexual activity: Never    Partners: Male    Birth control/protection: Surgical    Comment: TAH--?ovaries remain  Other Topics Concern  . Not on file  Social History Narrative   Divorced 1 son    Retired Museum/gallery curator for [employer]   Former smoker no alcohol caffeine or drug use report denies abuse and feels safe at home.   Social Determinants of Health   Financial Resource Strain: Medium Risk  . Difficulty of Paying Living Expenses: Somewhat hard  Food Insecurity: Food Insecurity Present  . Worried About Charity fundraiser in the Last Year: Often true  . Ran Out of Food in the Last Year: Sometimes true  Transportation Needs: No Transportation Needs  . Lack  of Transportation (Medical): No  . Lack of Transportation (Non-Medical): No  Physical Activity: Sufficiently Active  . Days of Exercise per Week: 5 days  . Minutes of Exercise per Session: 30 min  Stress: No Stress Concern Present  . Feeling of Stress : Not at all  Social Connections: Not on file  Intimate Partner Violence: Not At Risk  . Fear of Current or Ex-Partner: No  . Emotionally Abused: No  . Physically Abused: No  . Sexually Abused: No    Current Outpatient Medications on File Prior to Visit  Medication Sig Dispense Refill  . acarbose (PRECOSE) 25 MG tablet Take 0.5 tablets (12.5 mg total) by mouth 3 (three) times daily with meals. 135 tablet 1  . allopurinol (ZYLOPRIM) 100 MG tablet Take 1 tablet (100 mg total) by mouth daily. 30 tablet 0   . amLODipine (NORVASC) 10 MG tablet TAKE 1 TABLET BY MOUTH EVERY DAY (Patient taking differently: Take 10 mg by mouth daily.) 90 tablet 0  . aspirin 325 MG tablet Take 325 mg by mouth daily.    [name] atorvastatin (LIPITOR) 80 MG tablet TAKE 1 TABLET BY MOUTH EVERY DAY (Patient taking differently: Take 80 mg by mouth daily.) 90 tablet 1  . Blood Glucose Monitoring Suppl (ONETOUCH VERIO FLEX SYSTEM) w/Device KIT 1 each by Does not apply route 2 (two) times daily. E11.9    . bromocriptine (PARLODEL) 2.5 MG tablet Take 0.5 tablets (1.25 mg total) by mouth daily. (Patient taking differently: Take 2.5 mg by mouth daily.) 45 tablet 1  . epoetin alfa-epbx (RETACRIT) 28366 UNIT/ML injection Inject 40,000 Units into the skin every 28 (twenty-eight) days.    [name] glucose blood (ONETOUCH VERIO) test strip USE AS DIRECTED TWICE DAILY 200 strip 3  . hydrALAZINE (APRESOLINE) 50 MG tablet Take 1 tablet (50 mg total) by mouth every 8 (eight) hours. 90 tablet 0  . isosorbide mononitrate (IMDUR) 30 MG 24 hr tablet Take 1 tablet (30 mg total) by mouth daily. 30 tablet 0  . metoprolol succinate (TOPROL-XL) 100 MG 24 hr tablet Take 100 mg by mouth daily.    . minoxidil (LONITEN) 10 MG tablet Take 10 mg by mouth 2 (two) times daily.    [name] Delica Lancets 29U MISC 1 each by Does not apply route 2 (two) times daily. E11.9    . pantoprazole (PROTONIX) 40 MG tablet Take 1 tablet (40 mg total) by mouth daily. 90 tablet 3  . polyethylene glycol (MIRALAX / GLYCOLAX) 17 g packet Take 34-51 g by mouth daily as needed for mild constipation.    . repaglinide (PRANDIN) 2 MG tablet Take 1 tablet (2 mg total) by mouth 2 (two) times daily before a meal. 180 tablet 3  . torsemide (DEMADEX) 20 MG tablet Take 4 tablets (80 mg total) by mouth 2 (two) times daily. 240 tablet 0   No current facility-administered medications on file prior to visit.    Allergies  Allergen Reactions  . Semaglutide Rash    Rybelsus 3 mg    Family  History  Problem Relation Age of Onset  . Hypertension Mother   . Sleep apnea Mother   . Diabetes Mother   . Hyperlipidemia Mother   . Lung cancer Father        smoker  . Hypertension Sister   . Diabetes Sister   . Hypertension Brother   . Hypertension Sister   . Diabetes Brother   . Hypertension Brother   . Breast cancer Neg  Hx   . Colon cancer Neg Hx   . Esophageal cancer Neg Hx   . Pancreatic cancer Neg Hx   . Liver disease Neg Hx   . Colon polyps Neg Hx     BP (!) 148/52 (BP Location: Right Arm, Patient Position: Sitting, Cuff Size: Normal)   Pulse 77   Ht $R'5\' 2"'cL$  (1.575 m)   Wt 128 lb 9.6 oz (58.3 kg)   SpO2 97%   BMI 23.52 kg/m   Review of Systems She denies hypoglycemia.      Objective:   Physical Exam VITAL SIGNS:  See vs page GENERAL: no distress Pulses: dorsalis pedis intact bilat.   MSK: no deformity of the feet.  CV: no leg edema.   Skin:  no ulcer on the feet.  normal color and temp on the feet.   Neuro: sensation is intact to touch on the feet.   Ext: there is severe bilateral onychomycosis of the toenails.        Assessment & Plan:  Type 2 DM: uncontrolled Pt needs to see CDE for 1. Learn about insulin (both pen and syringe and vial), and 2. cbg's are lower than would be suggested by fructosamine.    Patient Instructions  Please go back to see diabetes educator.    check your blood sugar once a day.  vary the time of day when you check, between before the 3 meals, and at bedtime.  also check if you have symptoms of your blood sugar being too high or too low.  please keep a record of the readings and bring it to your next appointment here (or you can bring the meter itself).  You can write it on any piece of paper.  please call us sooner if your blood sugar goes below 70, or if you have a lot of readings over 200.   Please follow up with your kidney doctor soon, for the blood pressure.   Please come back for a follow-up appointment in 3 months.

## 2020-06-24 NOTE — Patient Instructions (Addendum)
Please go back to see diabetes educator.    check your blood sugar once a day.  vary the time of day when you check, between before the 3 meals, and at bedtime.  also check if you have symptoms of your blood sugar being too high or too low.  please keep a record of the readings and bring it to your next appointment here (or you can bring the meter itself).  You can write it on any piece of paper.  please call us sooner if your blood sugar goes below 70, or if you have a lot of readings over 200.   Please follow up with your kidney doctor soon, for the blood pressure.   Please come back for a follow-up appointment in 3 months.

## 2020-06-24 NOTE — Progress Notes (Signed)
This patient returns to my office for at risk foot care.  This patient requires this care by a professional since this patient will be at risk due to having type 2 diabetes and CKD.    This patient is unable to cut nails herself since the patient cannot reach her nails.These nails are painful walking and wearing shoes.  This patient presents for at risk foot care today.  General Appearance  Alert, conversant and in no acute stress.  Vascular  Dorsalis pedis and posterior tibial  pulses are weakly  palpable  bilaterally.  Capillary return is within normal limits  bilaterally. Cold feet   Bilaterally.  Absent digital hair  B/L.  Neurologic  Senn-Weinstein monofilament wire test within normal limits  bilaterally. Muscle power within normal limits bilaterally.  Nails Thick disfigured discolored nails with subungual debris  from hallux to fifth toes bilaterally. No evidence of bacterial infection or drainage bilaterally.  Orthopedic  No limitations of motion  feet .  No crepitus or effusions noted.  No bony pathology or digital deformities noted.  Skin  normotropic skin with no porokeratosis noted bilaterally.  No signs of infections or ulcers noted.     Onychomycosis  Pain in right toes  Pain in left toes  Consent was obtained for treatment procedures.   Mechanical debridement of nails 1-5  bilaterally performed with a nail nipper.  Filed with dremel without incident.    Return office visit    3 months                  Told patient to return for periodic foot care and evaluation due to potential at risk complications.   Marlow Hendrie DPM  

## 2020-06-25 DIAGNOSIS — E875 Hyperkalemia: Secondary | ICD-10-CM | POA: Diagnosis not present

## 2020-06-25 DIAGNOSIS — E1122 Type 2 diabetes mellitus with diabetic chronic kidney disease: Secondary | ICD-10-CM | POA: Diagnosis not present

## 2020-06-25 DIAGNOSIS — M1712 Unilateral primary osteoarthritis, left knee: Secondary | ICD-10-CM | POA: Diagnosis not present

## 2020-06-25 DIAGNOSIS — E785 Hyperlipidemia, unspecified: Secondary | ICD-10-CM | POA: Diagnosis not present

## 2020-06-25 DIAGNOSIS — D631 Anemia in chronic kidney disease: Secondary | ICD-10-CM | POA: Diagnosis not present

## 2020-06-25 DIAGNOSIS — I129 Hypertensive chronic kidney disease with stage 1 through stage 4 chronic kidney disease, or unspecified chronic kidney disease: Secondary | ICD-10-CM | POA: Diagnosis not present

## 2020-06-25 DIAGNOSIS — E1165 Type 2 diabetes mellitus with hyperglycemia: Secondary | ICD-10-CM | POA: Diagnosis not present

## 2020-06-25 DIAGNOSIS — K5900 Constipation, unspecified: Secondary | ICD-10-CM | POA: Diagnosis not present

## 2020-06-25 DIAGNOSIS — M5416 Radiculopathy, lumbar region: Secondary | ICD-10-CM | POA: Diagnosis not present

## 2020-06-25 DIAGNOSIS — N184 Chronic kidney disease, stage 4 (severe): Secondary | ICD-10-CM | POA: Diagnosis not present

## 2020-06-25 DIAGNOSIS — M109 Gout, unspecified: Secondary | ICD-10-CM | POA: Diagnosis not present

## 2020-06-25 DIAGNOSIS — G8929 Other chronic pain: Secondary | ICD-10-CM | POA: Diagnosis not present

## 2020-06-25 DIAGNOSIS — N179 Acute kidney failure, unspecified: Secondary | ICD-10-CM | POA: Diagnosis not present

## 2020-06-26 ENCOUNTER — Emergency Department (HOSPITAL_COMMUNITY): Payer: Medicare Other

## 2020-06-26 ENCOUNTER — Encounter (HOSPITAL_COMMUNITY): Payer: Self-pay | Admitting: Emergency Medicine

## 2020-06-26 ENCOUNTER — Other Ambulatory Visit: Payer: Self-pay

## 2020-06-26 ENCOUNTER — Inpatient Hospital Stay (HOSPITAL_COMMUNITY)
Admission: EM | Admit: 2020-06-26 | Discharge: 2020-06-29 | DRG: 291 | Disposition: A | Discharge status: Home Health | Payer: Medicare Other | Attending: Internal Medicine | Admitting: Internal Medicine

## 2020-06-26 ENCOUNTER — Ambulatory Visit (HOSPITAL_COMMUNITY)
Admission: RE | Admit: 2020-06-26 | Discharge: 2020-06-26 | Disposition: A | Discharge status: Home / Self Care | Payer: Medicare Other | Source: Ambulatory Visit | Attending: Nephrology | Admitting: Nephrology

## 2020-06-26 DIAGNOSIS — N184 Chronic kidney disease, stage 4 (severe): Secondary | ICD-10-CM | POA: Diagnosis present

## 2020-06-26 DIAGNOSIS — D125 Benign neoplasm of sigmoid colon: Secondary | ICD-10-CM | POA: Diagnosis present

## 2020-06-26 DIAGNOSIS — K5520 Angiodysplasia of colon without hemorrhage: Secondary | ICD-10-CM

## 2020-06-26 DIAGNOSIS — I129 Hypertensive chronic kidney disease with stage 1 through stage 4 chronic kidney disease, or unspecified chronic kidney disease: Secondary | ICD-10-CM | POA: Diagnosis not present

## 2020-06-26 DIAGNOSIS — N179 Acute kidney failure, unspecified: Secondary | ICD-10-CM | POA: Diagnosis not present

## 2020-06-26 DIAGNOSIS — Z888 Allergy status to other drugs, medicaments and biological substances status: Secondary | ICD-10-CM | POA: Diagnosis not present

## 2020-06-26 DIAGNOSIS — E785 Hyperlipidemia, unspecified: Secondary | ICD-10-CM | POA: Diagnosis present

## 2020-06-26 DIAGNOSIS — E1122 Type 2 diabetes mellitus with diabetic chronic kidney disease: Secondary | ICD-10-CM | POA: Diagnosis not present

## 2020-06-26 DIAGNOSIS — E0590 Thyrotoxicosis, unspecified without thyrotoxic crisis or storm: Secondary | ICD-10-CM | POA: Diagnosis not present

## 2020-06-26 DIAGNOSIS — R011 Cardiac murmur, unspecified: Secondary | ICD-10-CM | POA: Diagnosis not present

## 2020-06-26 DIAGNOSIS — I130 Hypertensive heart and chronic kidney disease with heart failure and stage 1 through stage 4 chronic kidney disease, or unspecified chronic kidney disease: Principal | ICD-10-CM | POA: Diagnosis present

## 2020-06-26 DIAGNOSIS — E876 Hypokalemia: Secondary | ICD-10-CM | POA: Diagnosis present

## 2020-06-26 DIAGNOSIS — K922 Gastrointestinal hemorrhage, unspecified: Secondary | ICD-10-CM | POA: Diagnosis not present

## 2020-06-26 DIAGNOSIS — G8929 Other chronic pain: Secondary | ICD-10-CM | POA: Diagnosis present

## 2020-06-26 DIAGNOSIS — Z8719 Personal history of other diseases of the digestive system: Secondary | ICD-10-CM | POA: Diagnosis not present

## 2020-06-26 DIAGNOSIS — Z7982 Long term (current) use of aspirin: Secondary | ICD-10-CM

## 2020-06-26 DIAGNOSIS — Z83438 Family history of other disorder of lipoprotein metabolism and other lipidemia: Secondary | ICD-10-CM | POA: Diagnosis not present

## 2020-06-26 DIAGNOSIS — Z833 Family history of diabetes mellitus: Secondary | ICD-10-CM

## 2020-06-26 DIAGNOSIS — E875 Hyperkalemia: Secondary | ICD-10-CM | POA: Diagnosis not present

## 2020-06-26 DIAGNOSIS — K219 Gastro-esophageal reflux disease without esophagitis: Secondary | ICD-10-CM | POA: Diagnosis present

## 2020-06-26 DIAGNOSIS — Z8249 Family history of ischemic heart disease and other diseases of the circulatory system: Secondary | ICD-10-CM | POA: Diagnosis not present

## 2020-06-26 DIAGNOSIS — Z90710 Acquired absence of both cervix and uterus: Secondary | ICD-10-CM

## 2020-06-26 DIAGNOSIS — D631 Anemia in chronic kidney disease: Secondary | ICD-10-CM | POA: Diagnosis present

## 2020-06-26 DIAGNOSIS — M5416 Radiculopathy, lumbar region: Secondary | ICD-10-CM | POA: Diagnosis not present

## 2020-06-26 DIAGNOSIS — F17200 Nicotine dependence, unspecified, uncomplicated: Secondary | ICD-10-CM | POA: Diagnosis present

## 2020-06-26 DIAGNOSIS — Z87891 Personal history of nicotine dependence: Secondary | ICD-10-CM | POA: Diagnosis not present

## 2020-06-26 DIAGNOSIS — J9811 Atelectasis: Secondary | ICD-10-CM | POA: Diagnosis not present

## 2020-06-26 DIAGNOSIS — Z79899 Other long term (current) drug therapy: Secondary | ICD-10-CM

## 2020-06-26 DIAGNOSIS — K648 Other hemorrhoids: Secondary | ICD-10-CM | POA: Diagnosis present

## 2020-06-26 DIAGNOSIS — M109 Gout, unspecified: Secondary | ICD-10-CM | POA: Diagnosis not present

## 2020-06-26 DIAGNOSIS — K5900 Constipation, unspecified: Secondary | ICD-10-CM | POA: Diagnosis not present

## 2020-06-26 DIAGNOSIS — Z20822 Contact with and (suspected) exposure to covid-19: Secondary | ICD-10-CM | POA: Diagnosis present

## 2020-06-26 DIAGNOSIS — D649 Anemia, unspecified: Secondary | ICD-10-CM

## 2020-06-26 DIAGNOSIS — K558 Other vascular disorders of intestine: Secondary | ICD-10-CM | POA: Diagnosis not present

## 2020-06-26 DIAGNOSIS — I5033 Acute on chronic diastolic (congestive) heart failure: Secondary | ICD-10-CM | POA: Diagnosis not present

## 2020-06-26 DIAGNOSIS — M1712 Unilateral primary osteoarthritis, left knee: Secondary | ICD-10-CM | POA: Diagnosis not present

## 2020-06-26 DIAGNOSIS — R195 Other fecal abnormalities: Secondary | ICD-10-CM | POA: Diagnosis not present

## 2020-06-26 DIAGNOSIS — E1165 Type 2 diabetes mellitus with hyperglycemia: Secondary | ICD-10-CM | POA: Diagnosis not present

## 2020-06-26 DIAGNOSIS — R0602 Shortness of breath: Secondary | ICD-10-CM | POA: Diagnosis not present

## 2020-06-26 LAB — CBC WITH DIFFERENTIAL/PLATELET
Abs Immature Granulocytes: 0.02 10*3/uL (ref 0.00–0.07)
Basophils Absolute: 0 10*3/uL (ref 0.0–0.1)
Basophils Relative: 1 %
Eosinophils Absolute: 0.1 10*3/uL (ref 0.0–0.5)
Eosinophils Relative: 3 %
HCT: 18.3 % — ABNORMAL LOW (ref 36.0–46.0)
Hemoglobin: 5.7 g/dL — CL (ref 12.0–15.0)
Immature Granulocytes: 0 %
Lymphocytes Relative: 12 %
Lymphs Abs: 0.7 10*3/uL (ref 0.7–4.0)
MCH: 31 pg (ref 26.0–34.0)
MCHC: 31.1 g/dL (ref 30.0–36.0)
MCV: 99.5 fL (ref 80.0–100.0)
Monocytes Absolute: 0.8 10*3/uL (ref 0.1–1.0)
Monocytes Relative: 15 %
Neutro Abs: 3.7 10*3/uL (ref 1.7–7.7)
Neutrophils Relative %: 69 %
Platelets: 196 10*3/uL (ref 150–400)
RBC: 1.84 MIL/uL — ABNORMAL LOW (ref 3.87–5.11)
RDW: 16.6 % — ABNORMAL HIGH (ref 11.5–15.5)
WBC: 5.4 10*3/uL (ref 4.0–10.5)
nRBC: 0 % (ref 0.0–0.2)

## 2020-06-26 LAB — BASIC METABOLIC PANEL
Anion gap: 9 (ref 5–15)
BUN: 99 mg/dL — ABNORMAL HIGH (ref 8–23)
CO2: 25 mmol/L (ref 22–32)
Calcium: 8.2 mg/dL — ABNORMAL LOW (ref 8.9–10.3)
Chloride: 102 mmol/L (ref 98–111)
Creatinine, Ser: 4.32 mg/dL — ABNORMAL HIGH (ref 0.44–1.00)
GFR, Estimated: 10 mL/min — ABNORMAL LOW (ref >60)
Glucose, Bld: 160 mg/dL — ABNORMAL HIGH (ref 70–99)
Potassium: 3.9 mmol/L (ref 3.5–5.1)
Sodium: 136 mmol/L (ref 135–145)

## 2020-06-26 LAB — RENAL FUNCTION PANEL
Albumin: 2.9 g/dL — ABNORMAL LOW (ref 3.5–5.0)
Anion gap: 14 (ref 5–15)
BUN: 98 mg/dL — ABNORMAL HIGH (ref 8–23)
CO2: 23 mmol/L (ref 22–32)
Calcium: 8.1 mg/dL — ABNORMAL LOW (ref 8.9–10.3)
Chloride: 98 mmol/L (ref 98–111)
Creatinine, Ser: 4.49 mg/dL — ABNORMAL HIGH (ref 0.44–1.00)
GFR, Estimated: 10 mL/min — ABNORMAL LOW (ref >60)
Glucose, Bld: 288 mg/dL — ABNORMAL HIGH (ref 70–99)
Phosphorus: 5.5 mg/dL — ABNORMAL HIGH (ref 2.5–4.6)
Potassium: 3.8 mmol/L (ref 3.5–5.1)
Sodium: 135 mmol/L (ref 135–145)

## 2020-06-26 LAB — IRON AND TIBC
Iron: 21 ug/dL — ABNORMAL LOW (ref 28–170)
Saturation Ratios: 10 % — ABNORMAL LOW (ref 10.4–31.8)
TIBC: 217 ug/dL — ABNORMAL LOW (ref 250–450)
UIBC: 196 ug/dL

## 2020-06-26 LAB — FERRITIN: Ferritin: 392 ng/mL — ABNORMAL HIGH (ref 11–307)

## 2020-06-26 MED ORDER — EPOETIN ALFA-EPBX 40000 UNIT/ML IJ SOLN
40000.0000 [IU] | INTRAMUSCULAR | Status: DC
Start: 2020-06-26 — End: 2020-06-27
  Administered 2020-06-26: 40000 [IU] via SUBCUTANEOUS

## 2020-06-26 MED ORDER — EPOETIN ALFA-EPBX 40000 UNIT/ML IJ SOLN
INTRAMUSCULAR | Status: AC
  Filled 2020-06-26: qty 1

## 2020-06-26 NOTE — ED Provider Notes (Signed)
Emergency Medicine Provider Triage Evaluation Note  [name] , a 70 y.o. female  was evaluated in triage.  Pt complains of evaluation of low blood counts.  Sent from PCPs office as she was getting an iron infusion done.  States that she has felt generally tired and intermittently shortness of breath.  No chest pain or rectal bleeding.  Review of Systems  Positive: Fatigue, shortness of breath Negative: Chest pain, rectal bleeding  Physical Exam  BP (!) 111/43 (BP Location: Left Arm)   Pulse 62   Temp 98.9 F (37.2 C) (Oral)   Resp 16   SpO2 100%  Gen:   Awake, no distress   Resp:  Normal effort  MSK:   Moves extremities without difficulty  Other:  No signs of respiratory distress  Medical Decision Making  Medically screening exam initiated at 4:55 PM.  Appropriate orders placed.  ALLIENE KLUGH was informed that the remainder of the evaluation will be completed by another provider, this initial triage assessment does not replace that evaluation, and the importance of remaining in the ED until their evaluation is complete.  Will obtain lab work and type and screen   Delia Heady, Hershal Coria 06/26/20 [place]    Tegeler, Gwenyth Allegra, MD 06/26/20 1740

## 2020-06-26 NOTE — ED Provider Notes (Signed)
[facility] EMERGENCY DEPARTMENT Provider Note   CSN: [id number] Arrival date & time: 06/26/20  1500     History Chief Complaint  Patient presents with  . Abnormal Lab    [name] is a 70 y.o. female.  HPI Patient presents with anemia.  Recent admission for same.  Was due to get iron infusion today and they checked a HemoCue and showed hemoglobin of 6.  States she has felt generally tired.  Denies any rectal bleeding at this time.  Recent admission to hospital with GI bleed and anemia.  No clear source of bleeding found.    Past Medical History:  Diagnosis Date  . Acute kidney injury ([place]) 01/08/2015  . Arthritis   . Chronic back pain   . Constipation   . Cough   . Diabetes mellitus, type 2 ([place])   . Diverticulosis   . Fibroid    patient thinks this was the reason for her hysterectomy  . GERD (gastroesophageal reflux disease)   . Heart murmur   . History of sebaceous cyst   . Hyperlipemia   . Hyperplastic colon polyp   . Hypertension   . Hyperthyroidism   . Lumbar radiculopathy   . Shortness of breath 09/13/2013  . Stroke ([place]) 2015?   x4   . Tobacco abuse   . 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 Acute on chronic diastolic CHF (congestive heart failure) ([place]) 06/04/2020  . Shortness of breath 06/03/2020  . GERD without esophagitis 06/03/2020  . Elevated troponin level not due myocardial infarction 06/03/2020  . Heme positive stool   . GI bleed 05/18/2020  . CKD (chronic kidney disease), stage IV ([place]) 05/18/2020  . Knee pain 05/18/2020  . Right flank discomfort 11/28/2019  . Vitamin D toxicity, accidental or unintentional, initial encounter 09/09/2017  . Ataxia 09/09/2017  . Type 2 diabetes mellitus with stage 4 chronic kidney disease, without long-term current use of insulin ([place]) 09/09/2017  . Chronic kidney disease (CKD)   . Olecranon  bursitis of right elbow 03/09/2017  . Thyroid nodule 06/29/2016  . 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 Vitamin D deficiency 01/12/2016  . Medicare annual wellness visit, initial 01/11/2016  . Lacunar infarct, acute ([place]) 03/23/2015  . Non compliance w medication regimen 01/16/2015  . Anemia 01/08/2015  . Stroke ([place]) 01/08/2015  . Mixed diabetic hyperlipidemia associated with type 2 diabetes mellitus ([place])   . Carotid bruit present 12/13/2012  . Routine health maintenance 10/24/2010  . PERIPHERAL EDEMA 09/10/2009  . ELECTROCARDIOGRAM, ABNORMAL 04/11/2008  . HEART MURMUR, SYSTOLIC 74/08/1446  . SEBACEOUS CYST, NECK 12/13/2006  . Hyperthyroidism 09/27/2006  . Secondary diabetes with peripheral vascular disease ([place]) 09/27/2006  . Hyperlipidemia LDL goal <70 09/27/2006  . TOBACCO ABUSE 09/27/2006  . Hypertensive urgency 09/27/2006    Past Surgical History:  Procedure Laterality Date  . ABDOMINAL HYSTERECTOMY     --?ovaries remain  . COLONOSCOPY W/ BIOPSIES  09/11/2008  . ESOPHAGOGASTRODUODENOSCOPY (EGD) WITH PROPOFOL N/A 05/20/2020   Procedure: ESOPHAGOGASTRODUODENOSCOPY (EGD) WITH PROPOFOL;  Surgeon: Irene Shipper, MD;  Location[facility] END[facility]COPY;  Service: Endoscopy;  Laterality: N/A;  . RETINOPATHY SURGERY Bilateral 2013   Dr. Zigmund Daniel     OB History    Gravida  1   Para  1  Term      Preterm      AB      Living  1     SAB      IAB      Ectopic      Multiple      Live Births              Family History  Problem Relation Age of Onset  . Hypertension Mother   . Sleep apnea Mother   . Diabetes Mother   . Hyperlipidemia Mother   . Lung cancer Father        smoker  . Hypertension Sister   . Diabetes Sister   . Hypertension Brother   . Hypertension Sister   . Diabetes Brother   . Hypertension Brother   . Breast cancer Neg  Hx   . Colon cancer Neg Hx   . Esophageal cancer Neg Hx   . Pancreatic cancer Neg Hx   . Liver disease Neg Hx   . Colon polyps Neg Hx     Social History   Tobacco Use  . Smoking status: Former Smoker    Packs/day: 0.50    Years: 35.00    Pack years: 17.50    Types: Cigarettes    Quit date: 07/07/2014    Years since quitting: 5.9  . Smokeless tobacco: Never Used  Vaping Use  . Vaping Use: Never used  Substance Use Topics  . Alcohol use: No  . Drug use: No    Home Medications Prior to Admission medications   Medication Sig Start Date End Date Taking? Authorizing Provider  acarbose (PRECOSE) 25 MG tablet Take 0.5 tablets (12.5 mg total) by mouth 3 (three) times daily with meals. 02/25/20   Renato Shin, MD  allopurinol (ZYLOPRIM) 100 MG tablet Take 1 tablet (100 mg total) by mouth daily. 05/23/20   Ghimire, Henreitta Leber, MD  amLODipine (NORVASC) 10 MG tablet TAKE 1 TABLET BY MOUTH EVERY DAY Patient taking differently: Take 10 mg by mouth daily. 03/16/20   Marrian Salvage, FNP  aspirin 325 MG tablet Take 325 mg by mouth daily.    [provider]  atorvastatin (LIPITOR) 80 MG tablet TAKE 1 TABLET BY MOUTH EVERY DAY Patient taking differently: Take 80 mg by mouth daily. 03/30/20   Marrian Salvage, FNP  Blood Glucose Monitoring Suppl ([place]) w/Device KIT 1 each by Does not apply route 2 (two) times daily. E11.9    [provider]  bromocriptine (PARLODEL) 2.5 MG tablet Take 0.5 tablets (1.25 mg total) by mouth daily. Patient taking differently: Take 2.5 mg by mouth daily. 02/24/20   Renato Shin, MD  epoetin alfa-epbx (RETACRIT) 38453 UNIT/ML injection Inject 40,000 Units into the skin every 28 (twenty-eight) days.    [provider]  glucose blood (ONETOUCH VERIO) test strip USE AS DIRECTED TWICE DAILY 12/12/19   Marrian Salvage, FNP  hydrALAZINE (APRESOLINE) 50 MG tablet Take 1 tablet (50 mg total) by mouth every 8  (eight) hours. 06/10/20 07/10/20  Dessa Phi, DO  isosorbide mononitrate (IMDUR) 30 MG 24 hr tablet Take 1 tablet (30 mg total) by mouth daily. 06/11/20   Dessa Phi, DO  metoprolol succinate (TOPROL-XL) 100 MG 24 hr tablet Take 100 mg by mouth daily.    [provider]  minoxidil (LONITEN) 10 MG tablet Take 10 mg by mouth 2 (two) times daily.    [provider]  OneTouch Delica Lancets 64W MISC 1 each  by Does not apply route 2 (two) times daily. E11.9    [provider]  pantoprazole (PROTONIX) 40 MG tablet Take 1 tablet (40 mg total) by mouth daily. 06/12/19   Olive Bass, FNP  polyethylene glycol (MIRALAX / GLYCOLAX) 17 g packet Take 34-51 g by mouth daily as needed for mild constipation.    [provider]  repaglinide (PRANDIN) 2 MG tablet Take 1 tablet (2 mg total) by mouth 2 (two) times daily before a meal. 01/10/20   Romero Belling, MD  torsemide (DEMADEX) 20 MG tablet Take 4 tablets (80 mg total) by mouth 2 (two) times daily. 06/10/20 07/10/20  Noralee Stain, DO    Allergies    Semaglutide  Review of Systems   Review of Systems  Constitutional: Positive for fatigue. Negative for appetite change.  Respiratory: Positive for shortness of breath.   Cardiovascular: Negative for chest pain.  Gastrointestinal: Negative for blood in stool.  Genitourinary: Negative for flank pain.  Musculoskeletal: Negative for back pain.  Skin: Negative for rash.  Neurological: Negative for weakness.  Psychiatric/Behavioral: Negative for confusion.    Physical Exam Updated Vital Signs BP (!) 129/52   Pulse 65   Temp 98.9 F (37.2 C) (Oral)   Resp 18   SpO2 99%   Physical Exam Vitals and nursing note reviewed.  HENT:     Head: Normocephalic.     Mouth/Throat:     Mouth: Mucous membranes are moist.  Eyes:     General: No scleral icterus.    Extraocular Movements: Extraocular movements intact.     Pupils: Pupils are equal, round, and reactive  to light.  Cardiovascular:     Rate and Rhythm: Regular rhythm.  Pulmonary:     Effort: Pulmonary effort is normal.  Abdominal:     Tenderness: There is no abdominal tenderness.  Musculoskeletal:        General: No tenderness.  Skin:    Capillary Refill: Capillary refill takes less than 2 seconds.     Coloration: Skin is pale.  Neurological:     Mental Status: She is alert and oriented to person, place, and time.     ED Results / Procedures / Treatments   Labs (all labs ordered are listed, but only abnormal results are displayed) Labs Reviewed  BASIC METABOLIC PANEL - Abnormal; Notable for the following components:      Result Value   Glucose, Bld 160 (*)    BUN 99 (*)    Creatinine, Ser 4.32 (*)    Calcium 8.2 (*)    GFR, Estimated 10 (*)    All other components within normal limits  CBC WITH DIFFERENTIAL/PLATELET - Abnormal; Notable for the following components:   RBC 1.84 (*)    Hemoglobin 5.7 (*)    HCT 18.3 (*)    RDW 16.6 (*)    All other components within normal limits  TYPE AND SCREEN    EKG None  Radiology DG Chest Portable 1 View  Result Date: 06/26/2020 CLINICAL DATA:  Shortness of breath and anemia EXAM: PORTABLE CHEST 1 VIEW COMPARISON:  Jun 05, 2020 FINDINGS: There is slight left lower lobe atelectasis. Lungs elsewhere clear. Heart is borderline prominent with pulmonary vascularity normal. No adenopathy. There is aortic sclerosis. No bone lesions. IMPRESSION: Stable borderline cardiac prominence. Mild left lower lobe atelectasis. Lungs otherwise clear. Aortic Atherosclerosis (ICD10-I70.0). Electronically Signed   By: Bretta Bang III M.D.   On: 06/26/2020 17:22    Procedures Procedures  Medications Ordered in ED Medications - No data to display  ED Course  I have reviewed the triage vital signs and the nursing notes.  Pertinent labs & imaging results that were available during my care of the patient were reviewed by me and considered in my  medical decision making (see chart for details).    MDM Rules/Calculators/A&P                          Patient sent in for anemia.  Hemoglobin back down to 5.7.  Denies black stools but has had recent GI bleeding.  Symptomatic with some shortness of breath.  Creatinine still above 4 but appears stable.  Will discuss with hospitalist for admission. Chronic kidney disease  Final Clinical Impression(s) / ED Diagnoses Final diagnoses:  Symptomatic anemia    Rx / DC Orders ED Discharge Orders    None       Davonna Belling, MD 06/26/20 2319

## 2020-06-26 NOTE — ED Triage Notes (Signed)
Patient states she was in another part of the hospital for an iron infusion and was sent to ED for evaluation of anemia. Patient alert, oriented, and in no apparent distress at this time.

## 2020-06-26 NOTE — Progress Notes (Addendum)
Hemocue today is 6, called and reported to [place] at [facility] the hemocue result of 6 as well as that the pt denies chest pain, shortness of breath, or seeing any dark stools or blood in urine.  Orders received to sen pt to ER

## 2020-06-27 DIAGNOSIS — D649 Anemia, unspecified: Secondary | ICD-10-CM | POA: Diagnosis present

## 2020-06-27 DIAGNOSIS — R195 Other fecal abnormalities: Secondary | ICD-10-CM

## 2020-06-27 LAB — PTH, INTACT AND CALCIUM
Calcium, Total (PTH): 8 mg/dL — ABNORMAL LOW (ref 8.7–10.3)
PTH: 238 pg/mL — ABNORMAL HIGH (ref 15–65)

## 2020-06-27 LAB — COMPREHENSIVE METABOLIC PANEL
ALT: 22 U/L (ref 0–44)
AST: 16 U/L (ref 15–41)
Albumin: 2.9 g/dL — ABNORMAL LOW (ref 3.5–5.0)
Alkaline Phosphatase: 57 U/L (ref 38–126)
Anion gap: 12 (ref 5–15)
BUN: 101 mg/dL — ABNORMAL HIGH (ref 8–23)
CO2: 24 mmol/L (ref 22–32)
Calcium: 8.3 mg/dL — ABNORMAL LOW (ref 8.9–10.3)
Chloride: 101 mmol/L (ref 98–111)
Creatinine, Ser: 4.36 mg/dL — ABNORMAL HIGH (ref 0.44–1.00)
GFR, Estimated: 10 mL/min — ABNORMAL LOW (ref >60)
Glucose, Bld: 77 mg/dL (ref 70–99)
Potassium: 3.7 mmol/L (ref 3.5–5.1)
Sodium: 137 mmol/L (ref 135–145)
Total Bilirubin: 2 mg/dL — ABNORMAL HIGH (ref 0.3–1.2)
Total Protein: 6.4 g/dL — ABNORMAL LOW (ref 6.5–8.1)

## 2020-06-27 LAB — CBC
HCT: 16.6 % — ABNORMAL LOW (ref 36.0–46.0)
HCT: 25.9 % — ABNORMAL LOW (ref 36.0–46.0)
HCT: 25.4 % — ABNORMAL LOW (ref 36.0–46.0)
HCT: 29.4 % — ABNORMAL LOW (ref 36.0–46.0)
Hemoglobin: 5.2 g/dL — CL (ref 12.0–15.0)
Hemoglobin: 8.8 g/dL — ABNORMAL LOW (ref 12.0–15.0)
Hemoglobin: 8.7 g/dL — ABNORMAL LOW (ref 12.0–15.0)
Hemoglobin: 9.8 g/dL — ABNORMAL LOW (ref 12.0–15.0)
MCH: 30.8 pg (ref 26.0–34.0)
MCH: 31.4 pg (ref 26.0–34.0)
MCH: 31.5 pg (ref 26.0–34.0)
MCH: 30.8 pg (ref 26.0–34.0)
MCHC: 31.3 g/dL (ref 30.0–36.0)
MCHC: 34 g/dL (ref 30.0–36.0)
MCHC: 34.3 g/dL (ref 30.0–36.0)
MCHC: 33.3 g/dL (ref 30.0–36.0)
MCV: 98.2 fL (ref 80.0–100.0)
MCV: 92.5 fL (ref 80.0–100.0)
MCV: 92 fL (ref 80.0–100.0)
MCV: 92.5 fL (ref 80.0–100.0)
Platelets: 180 10*3/uL (ref 150–400)
Platelets: 193 10*3/uL (ref 150–400)
Platelets: 190 10*3/uL (ref 150–400)
Platelets: 233 10*3/uL (ref 150–400)
RBC: 1.69 MIL/uL — ABNORMAL LOW (ref 3.87–5.11)
RBC: 2.8 MIL/uL — ABNORMAL LOW (ref 3.87–5.11)
RBC: 2.76 MIL/uL — ABNORMAL LOW (ref 3.87–5.11)
RBC: 3.18 MIL/uL — ABNORMAL LOW (ref 3.87–5.11)
RDW: 16.2 % — ABNORMAL HIGH (ref 11.5–15.5)
RDW: 16.6 % — ABNORMAL HIGH (ref 11.5–15.5)
RDW: 17 % — ABNORMAL HIGH (ref 11.5–15.5)
RDW: 17.2 % — ABNORMAL HIGH (ref 11.5–15.5)
WBC: 4.7 10*3/uL (ref 4.0–10.5)
WBC: 6 10*3/uL (ref 4.0–10.5)
WBC: 6.1 10*3/uL (ref 4.0–10.5)
WBC: 6.9 10*3/uL (ref 4.0–10.5)
nRBC: 0 % (ref 0.0–0.2)
nRBC: 0.3 % — ABNORMAL HIGH (ref 0.0–0.2)
nRBC: 0 % (ref 0.0–0.2)
nRBC: 0.6 % — ABNORMAL HIGH (ref 0.0–0.2)

## 2020-06-27 LAB — GLUCOSE, CAPILLARY
Glucose-Capillary: 94 mg/dL (ref 70–99)
Glucose-Capillary: 125 mg/dL — ABNORMAL HIGH (ref 70–99)
Glucose-Capillary: 67 mg/dL — ABNORMAL LOW (ref 70–99)
Glucose-Capillary: 99 mg/dL (ref 70–99)
Glucose-Capillary: 92 mg/dL (ref 70–99)

## 2020-06-27 LAB — OCCULT BLOOD X 1 CARD TO LAB, STOOL: Fecal Occult Bld: POSITIVE — AB

## 2020-06-27 LAB — PREPARE RBC (CROSSMATCH)

## 2020-06-27 LAB — SARS CORONAVIRUS 2 (TAT 6-24 HRS): SARS Coronavirus 2: NEGATIVE

## 2020-06-27 MED ORDER — TORSEMIDE 20 MG PO TABS
80.0000 mg | ORAL_TABLET | Freq: Two times a day (BID) | ORAL | Status: DC
  Administered 2020-06-27 – 2020-06-29 (×5): 80 mg via ORAL
  Filled 2020-06-27 (×5): qty 4

## 2020-06-27 MED ORDER — MORPHINE SULFATE (PF) 2 MG/ML IV SOLN: 2.0000 mg | INTRAVENOUS | Status: DC | PRN

## 2020-06-27 MED ORDER — PANTOPRAZOLE SODIUM 40 MG IV SOLR
40.0000 mg | Freq: Two times a day (BID) | INTRAVENOUS | Status: DC
  Administered 2020-06-27 – 2020-06-28 (×5): 40 mg via INTRAVENOUS
  Filled 2020-06-27 (×6): qty 40

## 2020-06-27 MED ORDER — POLYETHYLENE GLYCOL 3350 17 G PO PACK
34.0000 g | PACK | Freq: Every day | ORAL | Status: DC | PRN
  Administered 2020-06-27: 34 g via ORAL
  Filled 2020-06-27: qty 2

## 2020-06-27 MED ORDER — REPAGLINIDE 1 MG PO TABS
2.0000 mg | ORAL_TABLET | Freq: Two times a day (BID) | ORAL | Status: DC
  Administered 2020-06-27: 2 mg via ORAL
  Filled 2020-06-27: qty 1
  Filled 2020-06-27: qty 2

## 2020-06-27 MED ORDER — INSULIN ASPART 100 UNIT/ML IJ SOLN: 0.0000 [IU] | Freq: Every day | INTRAMUSCULAR | Status: DC

## 2020-06-27 MED ORDER — PEG-KCL-NACL-NASULF-NA ASC-C 100 G PO SOLR
0.5000 | Freq: Once | ORAL | Status: AC
  Administered 2020-06-27: 100 g via ORAL
  Filled 2020-06-27: qty 1

## 2020-06-27 MED ORDER — ATORVASTATIN CALCIUM 80 MG PO TABS
80.0000 mg | ORAL_TABLET | Freq: Every day | ORAL | Status: DC
  Administered 2020-06-27 – 2020-06-29 (×3): 80 mg via ORAL
  Filled 2020-06-27 (×3): qty 1

## 2020-06-27 MED ORDER — PANTOPRAZOLE SODIUM 40 MG PO TBEC: 40.0000 mg | DELAYED_RELEASE_TABLET | Freq: Every day | ORAL | Status: DC

## 2020-06-27 MED ORDER — BROMOCRIPTINE MESYLATE 2.5 MG PO TABS
1.2500 mg | ORAL_TABLET | Freq: Every day | ORAL | Status: DC
  Administered 2020-06-27 – 2020-06-29 (×3): 1.25 mg via ORAL
  Filled 2020-06-27 (×3): qty 1

## 2020-06-27 MED ORDER — AMLODIPINE BESYLATE 10 MG PO TABS
10.0000 mg | ORAL_TABLET | Freq: Every day | ORAL | Status: DC
  Administered 2020-06-27 – 2020-06-29 (×3): 10 mg via ORAL
  Filled 2020-06-27 (×3): qty 1

## 2020-06-27 MED ORDER — ACETAMINOPHEN 500 MG PO TABS: 1000.0000 mg | ORAL_TABLET | Freq: Four times a day (QID) | ORAL | Status: DC | PRN

## 2020-06-27 MED ORDER — INSULIN ASPART 100 UNIT/ML IJ SOLN
0.0000 [IU] | Freq: Three times a day (TID) | INTRAMUSCULAR | Status: DC
  Administered 2020-06-28: 2 [IU] via SUBCUTANEOUS

## 2020-06-27 MED ORDER — HYDRALAZINE HCL 50 MG PO TABS
50.0000 mg | ORAL_TABLET | Freq: Three times a day (TID) | ORAL | Status: DC
  Administered 2020-06-27 – 2020-06-29 (×8): 50 mg via ORAL
  Filled 2020-06-27: qty 1
  Filled 2020-06-27: qty 2
  Filled 2020-06-27 (×6): qty 1

## 2020-06-27 MED ORDER — ISOSORBIDE MONONITRATE ER 30 MG PO TB24
30.0000 mg | ORAL_TABLET | Freq: Every day | ORAL | Status: DC
  Administered 2020-06-27 – 2020-06-29 (×3): 30 mg via ORAL
  Filled 2020-06-27 (×3): qty 1

## 2020-06-27 MED ORDER — NICOTINE 21 MG/24HR TD PT24
21.0000 mg | MEDICATED_PATCH | Freq: Every day | TRANSDERMAL | Status: DC
  Filled 2020-06-27 (×3): qty 1

## 2020-06-27 MED ORDER — ONDANSETRON HCL 4 MG PO TABS: 4.0000 mg | ORAL_TABLET | Freq: Four times a day (QID) | ORAL | Status: DC | PRN

## 2020-06-27 MED ORDER — METOPROLOL SUCCINATE ER 100 MG PO TB24
100.0000 mg | ORAL_TABLET | Freq: Every day | ORAL | Status: DC
  Administered 2020-06-27 – 2020-06-29 (×3): 100 mg via ORAL
  Filled 2020-06-27 (×3): qty 1

## 2020-06-27 MED ORDER — ACARBOSE 25 MG PO TABS
12.5000 mg | ORAL_TABLET | Freq: Three times a day (TID) | ORAL | Status: DC
  Administered 2020-06-27: 12.5 mg via ORAL
  Filled 2020-06-27 (×2): qty 1

## 2020-06-27 MED ORDER — MINOXIDIL 10 MG PO TABS
10.0000 mg | ORAL_TABLET | Freq: Two times a day (BID) | ORAL | Status: DC
  Administered 2020-06-27 – 2020-06-29 (×5): 10 mg via ORAL
  Filled 2020-06-27 (×5): qty 1

## 2020-06-27 MED ORDER — ALLOPURINOL 100 MG PO TABS
100.0000 mg | ORAL_TABLET | Freq: Every day | ORAL | Status: DC
  Administered 2020-06-27 – 2020-06-29 (×3): 100 mg via ORAL
  Filled 2020-06-27 (×3): qty 1

## 2020-06-27 MED ORDER — SODIUM CHLORIDE 0.9% IV SOLUTION
Freq: Once | INTRAVENOUS | Status: AC
  Administered 2020-06-27: 250 mL via INTRAVENOUS

## 2020-06-27 MED ORDER — ONDANSETRON HCL 4 MG/2ML IJ SOLN: 4.0000 mg | Freq: Four times a day (QID) | INTRAMUSCULAR | Status: DC | PRN

## 2020-06-27 NOTE — ED Notes (Signed)
Pt given sandwich bag and ice water.  

## 2020-06-27 NOTE — Progress Notes (Signed)
Hypoglycemic Event  CBG: 67  Treatment: 4 oz juice/soda  Symptoms: None  Follow-up CBG: Time:15 CBG Result:99  Possible Reasons for Event: Inadequate meal intake  Comments/MD notified:per protocol     [name]

## 2020-06-27 NOTE — Consult Note (Addendum)
Referring Provider:  Dr. Alvino Chapel, [facility] Primary Care Physician:  Frederic Jericho, PA-C Primary Gastroenterologist:  Not sure as she's had care at both [facility] and here with [facility] recently  Reason for Consultation:  Symptomatic anemia  HPI: [name] is a 70 y.o. female with medical history significant of recurrent anemia secondary to suspected GI blood lossand chronic kidney disease, chronic kidney disease stage IV, hypertension, GERD, hyperlipidemia, non-insulin-dependent diabetes who was recently hospitalized twice, first April 25 to 29 and then May 11 to 18 both with a diagnosis of symptomatic anemia, possibly GI bleed.   Recent GI evaluation:  EGD 05/15/20 by Dr. Octaviano Glow showed normal study. Colonoscopy 05/15/20 by Dr. Octaviano Glow showed poor prep with medium internal hemorrhoids.  EGD 05/20/2020 by Dr. Marina Goodell was normal.  Colonoscopy 06/16/2020 showed a 5 mm sessile polyp in the sigmoid colon and medium internal hemorrhoids.  Poor prep.  She returned again on 6/3 with similar symptoms of weakness and SOB.  Hgb again at 5.2 grams compared to 8.9 grams just two weeks ago.  She is not a great historian but reports dark stools, it sounds like intermittently.  No bright red rectal bleeding.  She has received 2 units PRBCs.  Is hemoccult positive.  Admits to only taking one Aleve recently because she had run out of tylenol.  Past Medical History:  Diagnosis Date  . Acute kidney injury (HCC) 01/08/2015  . Arthritis   . Chronic back pain   . Constipation   . Cough   . Diabetes mellitus, type 2 (HCC)   . Diverticulosis   . Fibroid    patient thinks this was the reason for her hysterectomy  . GERD (gastroesophageal reflux disease)   . Heart murmur   . History of sebaceous cyst   . Hyperlipemia   . Hyperplastic colon polyp   . Hypertension   . Hyperthyroidism   . Lumbar radiculopathy   . Shortness of breath 09/13/2013  . Stroke (HCC) 2015?   x4   . Tobacco abuse   .  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 ABDOMINAL HYSTERECTOMY     --?ovaries remain  . COLONOSCOPY W/ BIOPSIES  09/11/2008  . ESOPHAGOGASTRODUODENOSCOPY (EGD) WITH PROPOFOL N/A 05/20/2020   Procedure: ESOPHAGOGASTRODUODENOSCOPY (EGD) WITH PROPOFOL;  Surgeon: Hilarie Fredrickson, MD;  Location: [facility] ENDOSCOPY;  Service: Endoscopy;  Laterality: N/A;  . RETINOPATHY SURGERY Bilateral 2013   Dr. Ashley Royalty    Prior to Admission medications   Medication Sig Start Date End Date Taking? Authorizing Provider  acarbose (PRECOSE) 25 MG tablet Take 0.5 tablets (12.5 mg total) by mouth 3 (three) times daily with meals. 02/25/20  Yes Romero Belling, MD  acetaminophen (TYLENOL) 500 MG tablet Take 1,000 mg by mouth every 6 (six) hours as needed for moderate pain.   Yes [provider]  allopurinol (ZYLOPRIM) 100 MG tablet Take 1 tablet (100 mg total) by mouth daily. 05/23/20  Yes Ghimire, Werner Lean, MD  amLODipine (NORVASC) 10 MG tablet TAKE 1 TABLET BY MOUTH EVERY DAY Patient taking differently: Take 10 mg by mouth daily. 03/16/20  Yes Olive Bass, FNP  aspirin 325 MG tablet Take 325 mg by mouth daily.   Yes [provider]  atorvastatin (LIPITOR) 80 MG tablet TAKE 1 TABLET BY MOUTH EVERY DAY Patient taking differently: Take 80 mg by mouth daily. 03/30/20  Yes Olive Bass, FNP  Blood Glucose Monitoring Suppl (ONETOUCH VERIO FLEX SYSTEM) w/Device KIT 1 each by Does not apply route 2 (two) times daily. E11.9   Yes [provider]  bromocriptine (PARLODEL) 2.5 MG tablet Take 0.5 tablets (1.25 mg total) by mouth daily. 02/24/20  Yes Renato Shin, MD  epoetin alfa-epbx (RETACRIT) 40375 UNIT/ML injection Inject 40,000 Units into the skin every 28 (twenty-eight) days.   Yes [provider]  glucose blood (ONETOUCH VERIO) test strip USE AS DIRECTED TWICE DAILY 12/12/19  Yes Marrian Salvage, FNP  hydrALAZINE (APRESOLINE) 50 MG tablet Take 1 tablet (50 mg total) by mouth every 8 (eight) hours. 06/10/20 07/10/20 Yes Dessa Phi, DO  isosorbide mononitrate (IMDUR) 30 MG 24 hr tablet Take 1 tablet (30 mg total) by mouth daily. 06/11/20  Yes Dessa Phi, DO  metoprolol succinate (TOPROL-XL) 100 MG 24 hr tablet Take 100 mg by mouth daily.   Yes [provider]  minoxidil (LONITEN) 10 MG tablet Take 10 mg by mouth 2 (two) times daily.   Yes [provider]  naproxen sodium (ALEVE) 220 MG tablet Take 220 mg by mouth daily as needed (pain).   Yes [provider]  OneTouch Delica Lancets 43K MISC 1 each by Does not apply route 2 (two) times daily. E11.9   Yes [provider]  pantoprazole (PROTONIX) 40 MG tablet Take 1 tablet (40 mg total) by mouth daily. 06/12/19  Yes Marrian Salvage, FNP  polyethylene glycol (MIRALAX / GLYCOLAX) 17 g packet Take 34-51 g by mouth daily as needed for mild constipation.   Yes [provider]  repaglinide (PRANDIN) 2 MG tablet Take 1 tablet (2 mg total) by mouth 2 (two) times daily before a meal. 01/10/20  Yes Renato Shin, MD  torsemide (DEMADEX) 20 MG tablet Take 4 tablets (80 mg total) by mouth 2 (two) times daily. 06/10/20 07/10/20 Yes Dessa Phi, DO    Current Facility-Administered Medications  Medication Dose Route Frequency Provider Last Rate Last Admin  . acetaminophen (TYLENOL) tablet 1,000 mg  1,000 mg Oral Q6H PRN Gala Romney L, MD      . allopurinol (ZYLOPRIM) tablet 100 mg  100 mg Oral Daily Gala Romney L, MD   100 mg at 06/27/20 0817  . amLODipine (NORVASC) tablet 10 mg  10 mg Oral Daily Elwyn Reach, MD   10 mg at 06/27/20 0817  . atorvastatin (LIPITOR) tablet 80 mg  80 mg Oral Daily Elwyn Reach, MD   80 mg at 06/27/20 0817  . bromocriptine (PARLODEL) tablet 1.25 mg  1.25 mg Oral Daily Gala Romney L, MD   1.25 mg at 06/27/20 0819  . hydrALAZINE (APRESOLINE)  tablet 50 mg  50 mg Oral Q8H Gala Romney L, MD   50 mg at 06/27/20 0705  . insulin aspart (novoLOG) injection 0-5 Units  0-5 Units Subcutaneous QHS Garba, Mohammad L, MD      . insulin aspart (novoLOG) injection 0-6 Units  0-6 Units Subcutaneous TID WC Garba, Mohammad L, MD      . isosorbide mononitrate (IMDUR) 24 hr tablet 30 mg  30 mg Oral Daily Gala Romney L, MD   30 mg at 06/27/20 0817  . metoprolol succinate (TOPROL-XL) 24 hr tablet 100 mg  100 mg Oral Daily Gala Romney L, MD   100 mg at 06/27/20 0820  . minoxidil (LONITEN) tablet 10 mg  10 mg Oral BID Elwyn Reach, MD   10 mg at 06/27/20 0677  .  nicotine (NICODERM CQ - dosed in mg/24 hours) patch 21 mg  21 mg Transdermal Daily Garba, Mohammad L, MD      . ondansetron (ZOFRAN) tablet 4 mg  4 mg Oral Q6H PRN Elwyn Reach, MD       Or  . ondansetron (ZOFRAN) injection 4 mg  4 mg Intravenous Q6H PRN Gala Romney L, MD      . pantoprazole (PROTONIX) injection 40 mg  40 mg Intravenous Q12H Elwyn Reach, MD   40 mg at 06/27/20 0817  . polyethylene glycol (MIRALAX / GLYCOLAX) packet 34-51 g  34-51 g Oral Daily PRN Elwyn Reach, MD   34 g at 06/27/20 0827  . torsemide (DEMADEX) tablet 80 mg  80 mg Oral BID Dessa Phi, DO   80 mg at 06/27/20 0827    Allergies as of 06/26/2020 - Review Complete 06/26/2020  Allergen Reaction Noted  . Semaglutide Rash 04/10/2020    Family History  Problem Relation Age of Onset  . Hypertension Mother   . Sleep apnea Mother   . Diabetes Mother   . Hyperlipidemia Mother   . Lung cancer Father        smoker  . Hypertension Sister   . Diabetes Sister   . Hypertension Brother   . Hypertension Sister   . Diabetes Brother   . Hypertension Brother   . Breast cancer Neg Hx   . Colon cancer Neg Hx   . Esophageal cancer Neg Hx   . Pancreatic cancer Neg Hx   . Liver disease Neg Hx   . Colon polyps Neg Hx     Social History   Socioeconomic History  . Marital status:  Divorced    Spouse name: Not on file  . Number of children: 1  . Years of education: 87  . Highest education level: Not on file  Occupational History  . 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 Smoking status: Former Smoker    Packs/day: 0.50    Years: 35.00    Pack years: 17.50    Types: Cigarettes    Quit date: 07/07/2014    Years since quitting: 5.9  . Smokeless tobacco: Never Used  Vaping Use  . Vaping Use: Never used  Substance and Sexual Activity  . Alcohol use: No  . Drug use: No  . Sexual activity: Never    Partners: Male    Birth control/protection: Surgical    Comment: TAH--?ovaries remain  Other Topics Concern  . Not on file  Social History Narrative   Divorced 1 son    Retired Museum/gallery curator for [employer]   Former smoker no alcohol caffeine or drug use report denies abuse and feels safe at home.   Social Determinants of Health   Financial Resource Strain: Medium Risk  . Difficulty of Paying Living Expenses: Somewhat hard  Food Insecurity: Food Insecurity Present  . Worried About Charity fundraiser in the Last Year: Often true  . Ran Out of Food in the Last Year: Sometimes true  Transportation Needs: No Transportation Needs  . Lack of Transportation (Medical): No  . Lack of Transportation (Non-Medical): No  Physical Activity: Sufficiently Active  . Days of Exercise per Week: 5 days  . Minutes of Exercise per Session: 30 min  Stress: No Stress Concern Present  . Feeling of Stress : Not at all  Social Connections: Not on file  Intimate  Partner Violence: Not At Risk  . Fear of Current or Ex-Partner: No  . Emotionally Abused: No  . Physically Abused: No  . Sexually Abused: No    Review of Systems: ROS is O/W negative except as mentioned in HPI.  Physical Exam: Vital signs in last 24 hours: Temp:  [98 F (36.7 C)-99.5 F (37.5 C)] 98.5 F (36.9 C) (06/04 1118) Pulse Rate:  [62-80]  65 (06/04 1118) Resp:  [13-20] 20 (06/04 1118) BP: (101-149)/(39-64) 112/48 (06/04 1118) SpO2:  [93 %-100 %] 99 % (06/04 1118) Weight:  [57.4 kg] 57.4 kg (06/04 0234) Last BM Date: 06/25/20 General:  Alert, Well-developed, well-nourished, pleasant and cooperative in NAD Head:  Normocephalic and atraumatic. Eyes:  Sclera clear, no icterus.  Conjunctiva pink. Ears:  Normal auditory acuity. Mouth:  No deformity or lesions.   Lungs:  Clear throughout to auscultation.  No wheezes, crackles, or rhonchi.  Heart:  Regular rate and rhythm; no murmurs, clicks, rubs, or gallops. Abdomen:  Soft, non-distended.  BS present.  Non-tender. Rectal:  Dark brown stool on exam glove, not melena, heme positive.  Msk:  Symmetrical without gross deformities. Pulses:  Normal pulses noted. Extremities:  Without clubbing or edema. Neurologic:  Alert and oriented x 4;  grossly normal neurologically. Skin:  Intact without significant lesions or rashes. Psych:  Alert and cooperative. Normal mood and affect.  Intake/Output from previous day: 06/03 0701 - 06/04 0700 In: 288 [P.O.:240; Blood:48] Out: 600 [Urine:600] Intake/Output this shift: Total I/O In: 605 [P.O.:120; I.V.:20; Blood:465] Out: 400 [Urine:400]  Lab Results: Recent Labs    06/26/20 1655 06/27/20 0204  WBC 5.4 4.7  HGB 5.7* 5.2*  HCT 18.3* 16.6*  PLT 196 180   BMET Recent Labs    06/26/20 1435 06/26/20 1655  NA 135 136  K 3.8 3.9  CL 98 102  CO2 23 25  GLUCOSE 288* 160*  BUN 98* 99*  CREATININE 4.49* 4.32*  CALCIUM 8.1*  8.0* 8.2*   LFT Recent Labs    06/26/20 1435  ALBUMIN 2.9*   Studies/Results: DG Chest Portable 1 View  Result Date: 06/26/2020 CLINICAL DATA:  Shortness of breath and anemia EXAM: PORTABLE CHEST 1 VIEW COMPARISON:  Jun 05, 2020 FINDINGS: There is slight left lower lobe atelectasis. Lungs elsewhere clear. Heart is borderline prominent with pulmonary vascularity normal. No adenopathy. There is aortic  sclerosis. No bone lesions. IMPRESSION: Stable borderline cardiac prominence. Mild left lower lobe atelectasis. Lungs otherwise clear. Aortic Atherosclerosis (ICD10-I70.0). Electronically Signed   By: Lowella Grip III M.D.   On: 06/26/2020 17:22   IMPRESSION:  *Recurrent acute on chronic anemia with hemoccult positive stools:  Hgb 5.2 grams as compared to 8.9 grams just 2 weeks ago.  Received 2 units PRBCs.  Receiving Epogen regularly and Feraheme periodically.  Iron studies most recently c/w AOCD. *Advanced CKD:  Receives Epogen.  This is likely playing a big role in her anemia issues.  PLAN: -Monitor Hgb and transfuse further prn. -Plans for further endoscopic evaluation per Dr. Henrene Pastor.  Laban Emperor. Zehr  06/27/2020, 11:24 AM  GI ATTENDING  History, laboratories, x-rays, prior endoscopy reports reviewed.  Patient personally seen and examined.  Agree with comprehensive consultation note as outlined above.  IMPRESSION: 1.  Multifactorial anemia.  Stool brown.  No overt bleeding.  Hemoccult positivity noted 2.  Multiple normal upper endoscopies 3.  Several complete colonoscopies elsewhere, limited by preparation.  Recommended by her primary GI to have outpatient virtual colonoscopy  RECOMMENDATIONS:  1.  Transfuse to appropriate hemoglobin 2.  Continue Epogen and iron replacement as needed.  May benefit from formal hematology opinion if not recently obtained. 3.  Schedule capsule endoscopy to evaluate the small bowel for lesions such as AVMs.  We will try to have this performed tomorrow.  She will be given limited bowel prep preprocedure.The nature of the procedure, as well as the risks, benefits, and alternatives were carefully and thoroughly reviewed with the patient. Ample time for discussion and questions allowed. The patient understood, was satisfied, and agreed to proceed. 4.  Agree with outpatient virtual colonoscopy given her inability to complete adequate colon preparation.  This  will be under the guidance of her primary gastroenterologist Dr. Rolm Bookbinder.  Docia Chuck. Geri Seminole., M.D. [facility] Division of Gastroenterology

## 2020-06-27 NOTE — H&P (Signed)
History and Physical   [name] OFB:[id number] DOB: 1950/12/03 DOA: 06/26/2020  Referring MD/NP/PA: Dr. Alvino Chapel  PCP: Erby Pian, PA-C   Patient coming from: Home  Chief Complaint: Shortness of breath and weakness  HPI: [name] is a 70 y.o. female with medical history significant of recurrent anemia secondary to suspected GI bleed and chronic kidney disease, chronic kidney disease stage IV, hypertension, GERD, hyperlipidemia, non-insulin-dependent diabetes, who was recently hospitalized twice first April 25 to 29 and then May 11 to 18 both with a diagnosis of symptomatic anemia possibly GI bleed.  She is to follow-up with Dr. Debara Pickett with her cardiologist as well as nephrologist Dr. Nyra Capes.  Patient was transfused and discharged.  She returned today with similar symptoms and hemoglobin is now 5.7 as against 8.9 when she left the hospital on May 18.  She denied any melena.  No bright red blood per rectum.  She had colonoscopy done that hospitalization the only showed 1 polyp that was removed.  She is feeling weak some exertional dyspnea.  Patient therefore being admitted with symptomatic anemia again due to possible GI bleed.  So far stool guaiac is pending.  Patient has elevated BUN which makes it worrisome for possible upper GI bleed.  She will be admitted to the hospital for observation and treatment.  ED Course: Temperature is 98.9 blood pressure 129/52, pulse 69 respiratory rate of 18 oxygen sat 97% room air.  White count 5.4 hemoglobin 5.7 with platelets 196.  Sodium 136 potassium 3.9 chloride 102 CO2 25 BUN 99 creatinine 4.32 and calcium 8.2.  Glucose 288.  Patient will be admitted with symptomatic anemia  Review of Systems: As per HPI otherwise 10 point review of systems negative.    Past Medical History:  Diagnosis Date  . Acute kidney injury ([place]) 01/08/2015  . Arthritis   . Chronic back pain   . Constipation   . Cough   . Diabetes mellitus, type 2  ([place])   . Diverticulosis   . Fibroid    patient thinks this was the reason for her hysterectomy  . GERD (gastroesophageal reflux disease)   . Heart murmur   . History of sebaceous cyst   . Hyperlipemia   . Hyperplastic colon polyp   . Hypertension   . Hyperthyroidism   . Lumbar radiculopathy   . Shortness of breath 09/13/2013  . Stroke ([place]) 2015?   x4   . Tobacco abuse   . 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 ABDOMINAL HYSTERECTOMY     --?ovaries remain  . COLONOSCOPY W/ BIOPSIES  09/11/2008  . ESOPHAGOGASTRODUODENOSCOPY (EGD) WITH PROPOFOL N/A 05/20/2020   Procedure: ESOPHAGOGASTRODUODENOSCOPY (EGD) WITH PROPOFOL;  Surgeon: Irene Shipper, MD;  Location: [facility] ENDOSCOPY;  Service: Endoscopy;  Laterality: N/A;  . RETINOPATHY SURGERY Bilateral 2013   Dr. Zigmund Daniel     reports that she quit smoking about 5 years ago. Her smoking use included cigarettes. She has a 17.50 pack-year smoking history. She has never used smokeless tobacco. She reports that she does not drink alcohol and does not use drugs.  Allergies  Allergen Reactions  . Semaglutide Rash    Rybelsus 3 mg    Family History  Problem Relation Age of Onset  . Hypertension Mother   . Sleep apnea Mother   . Diabetes Mother   . Hyperlipidemia Mother   . Lung cancer Father  smoker  . Hypertension Sister   . Diabetes Sister   . Hypertension Brother   . Hypertension Sister   . Diabetes Brother   . Hypertension Brother   . Breast cancer Neg Hx   . Colon cancer Neg Hx   . Esophageal cancer Neg Hx   . Pancreatic cancer Neg Hx   . Liver disease Neg Hx   . Colon polyps Neg Hx      Prior to Admission medications   Medication Sig Start Date End Date Taking? Authorizing Provider  acarbose (PRECOSE) 25 MG tablet Take 0.5 tablets (12.5 mg total) by mouth 3 (three) times daily with meals. 02/25/20  Yes Renato Shin, MD   acetaminophen (TYLENOL) 500 MG tablet Take 1,000 mg by mouth every 6 (six) hours as needed for moderate pain.   Yes [provider]  allopurinol (ZYLOPRIM) 100 MG tablet Take 1 tablet (100 mg total) by mouth daily. 05/23/20  Yes Ghimire, Henreitta Leber, MD  amLODipine (NORVASC) 10 MG tablet TAKE 1 TABLET BY MOUTH EVERY DAY Patient taking differently: Take 10 mg by mouth daily. 03/16/20  Yes Marrian Salvage, FNP  aspirin 325 MG tablet Take 325 mg by mouth daily.   Yes [provider]  atorvastatin (LIPITOR) 80 MG tablet TAKE 1 TABLET BY MOUTH EVERY DAY Patient taking differently: Take 80 mg by mouth daily. 03/30/20  Yes Marrian Salvage, FNP  Blood Glucose Monitoring Suppl ([place]) w/Device KIT 1 each by Does not apply route 2 (two) times daily. E11.9   Yes [provider]  bromocriptine (PARLODEL) 2.5 MG tablet Take 0.5 tablets (1.25 mg total) by mouth daily. 02/24/20  Yes Renato Shin, MD  epoetin alfa-epbx (RETACRIT) 47096 UNIT/ML injection Inject 40,000 Units into the skin every 28 (twenty-eight) days.   Yes [provider]  glucose blood (ONETOUCH VERIO) test strip USE AS DIRECTED TWICE DAILY 12/12/19  Yes Marrian Salvage, FNP  hydrALAZINE (APRESOLINE) 50 MG tablet Take 1 tablet (50 mg total) by mouth every 8 (eight) hours. 06/10/20 07/10/20 Yes Dessa Phi, DO  isosorbide mononitrate (IMDUR) 30 MG 24 hr tablet Take 1 tablet (30 mg total) by mouth daily. 06/11/20  Yes Dessa Phi, DO  metoprolol succinate (TOPROL-XL) 100 MG 24 hr tablet Take 100 mg by mouth daily.   Yes [provider]  minoxidil (LONITEN) 10 MG tablet Take 10 mg by mouth 2 (two) times daily.   Yes [provider]  naproxen sodium (ALEVE) 220 MG tablet Take 220 mg by mouth daily as needed (pain).   Yes [provider]  OneTouch Delica Lancets 28Z MISC 1 each by Does not apply route 2 (two) times daily. E11.9   Yes [provider]  pantoprazole (PROTONIX) 40 MG tablet Take 1 tablet (40 mg total) by mouth daily. 06/12/19  Yes Marrian Salvage, FNP  polyethylene glycol (MIRALAX / GLYCOLAX) 17 g packet Take 34-51 g by mouth daily as needed for mild constipation.   Yes [provider]  repaglinide (PRANDIN) 2 MG tablet Take 1 tablet (2 mg total) by mouth 2 (two) times daily before a meal. 01/10/20  Yes Renato Shin, MD  torsemide (DEMADEX) 20 MG tablet Take 4 tablets (80 mg total) by mouth 2 (two) times daily. 06/10/20 07/10/20 Yes Dessa Phi, DO    Physical Exam: Vitals:   06/26/20 1839 06/26/20 2145 06/26/20 2315 06/26/20 2330  BP: (!) 101/44 (!) 129/52 (!) 119/49 (!) 115/50  Pulse: 64 65  69 68  Resp: $Remo'16 18 16 15  'ZYTrO$ Temp:      TempSrc:      SpO2: 99% 99% 97% 98%      Constitutional: Acutely ill looking, weak, no distress Vitals:   06/26/20 1839 06/26/20 2145 06/26/20 2315 06/26/20 2330  BP: (!) 101/44 (!) 129/52 (!) 119/49 (!) 115/50  Pulse: 64 65 69 68  Resp: $Remo'16 18 16 15  'iBWbV$ Temp:      TempSrc:      SpO2: 99% 99% 97% 98%   Eyes: PERRL, lids and conjunctivae pale l ENMT: Mucous membranes are moist. Posterior pharynx clear of any exudate or lesions.Normal dentition.  Neck: normal, supple, no masses, no thyromegaly Respiratory: clear to auscultation bilaterally, no wheezing, no crackles. Normal respiratory effort. No accessory muscle use.  Cardiovascular: Regular rate and rhythm, no murmurs / rubs / gallops. No extremity edema. 2+ pedal pulses. No carotid bruits.  Abdomen: no tenderness, no masses palpated. No hepatosplenomegaly. Bowel sounds positive.  Musculoskeletal: no clubbing / cyanosis. No joint deformity upper and lower extremities. Good ROM, no contractures. Normal muscle tone.  Skin: no rashes, lesions, ulcers. No induration Neurologic: CN 2-12 grossly intact. Sensation intact, DTR normal. Strength 5/5 in all 4.  Psychiatric: Normal judgment and insight. Alert and  oriented x 3.  Anxious mood.     Labs on Admission: I have personally reviewed following labs and imaging studies  CBC: Recent Labs  Lab 06/26/20 1655  WBC 5.4  NEUTROABS 3.7  HGB 5.7*  HCT 18.3*  MCV 99.5  PLT 629   Basic Metabolic Panel: Recent Labs  Lab 06/26/20 1435 06/26/20 1655  NA 135 136  K 3.8 3.9  CL 98 102  CO2 23 25  GLUCOSE 288* 160*  BUN 98* 99*  CREATININE 4.49* 4.32*  CALCIUM 8.1* 8.2*  PHOS 5.5*  --    GFR: Estimated Creatinine Clearance: 9.6 mL/min (A) (by C-G formula based on SCr of 4.32 mg/dL (H)). Liver Function Tests: Recent Labs  Lab 06/26/20 1435  ALBUMIN 2.9*   No results for input(s): LIPASE, AMYLASE in the last 168 hours. No results for input(s): AMMONIA in the last 168 hours. Coagulation Profile: No results for input(s): INR, PROTIME in the last 168 hours. Cardiac Enzymes: No results for input(s): CKTOTAL, CKMB, CKMBINDEX, TROPONINI in the last 168 hours. BNP (last 3 results) No results for input(s): PROBNP in the last 8760 hours. HbA1C: Recent Labs    06/24/20 0955  HGBA1C 6.0*   CBG: No results for input(s): GLUCAP in the last 168 hours. Lipid Profile: No results for input(s): CHOL, HDL, LDLCALC, TRIG, CHOLHDL, LDLDIRECT in the last 72 hours. Thyroid Function Tests: No results for input(s): TSH, T4TOTAL, FREET4, T3FREE, THYROIDAB in the last 72 hours. Anemia Panel: Recent Labs    06/26/20 1435  FERRITIN 392*  TIBC 217*  IRON 21*   Urine analysis:    Component Value Date/Time   COLORURINE YELLOW 06/07/2020 0041   APPEARANCEUR CLEAR 06/07/2020 0041   LABSPEC 1.008 06/07/2020 0041   PHURINE 5.0 06/07/2020 0041   GLUCOSEU NEGATIVE 06/07/2020 0041   GLUCOSEU NEGATIVE 09/04/2009 0721   HGBUR NEGATIVE 06/07/2020 0041   BILIRUBINUR NEGATIVE 06/07/2020 0041   BILIRUBINUR negative 09/24/2019 1003   KETONESUR NEGATIVE 06/07/2020 0041   PROTEINUR 100 (A) 06/07/2020 0041   UROBILINOGEN 0.2 09/24/2019 1003    UROBILINOGEN 0.2 09/04/2009 0721   NITRITE NEGATIVE 06/07/2020 0041   LEUKOCYTESUR NEGATIVE 06/07/2020 0041   Sepsis Labs: $RemoveBefo'@LABRCNTIP'GsSkrcQkKGf$ (procalcitonin:4,lacticidven:4) )No results  found for this or any previous visit (from the past 240 hour(s)).   Radiological Exams on Admission: DG Chest Portable 1 View  Result Date: 06/26/2020 CLINICAL DATA:  Shortness of breath and anemia EXAM: PORTABLE CHEST 1 VIEW COMPARISON:  Jun 05, 2020 FINDINGS: There is slight left lower lobe atelectasis. Lungs elsewhere clear. Heart is borderline prominent with pulmonary vascularity normal. No adenopathy. There is aortic sclerosis. No bone lesions. IMPRESSION: Stable borderline cardiac prominence. Mild left lower lobe atelectasis. Lungs otherwise clear. Aortic Atherosclerosis (ICD10-I70.0). Electronically Signed   By: Lowella Grip III M.D.   On: 06/26/2020 17:22      Assessment/Plan Principal Problem:   Symptomatic anemia Active Problems:   Hyperthyroidism   Hyperlipidemia LDL goal <70   TOBACCO ABUSE   Type 2 diabetes mellitus with stage 4 chronic kidney disease, without long-term current use of insulin (HCC)   GI bleed   CKD (chronic kidney disease), stage IV (HCC)   GERD without esophagitis   Acute on chronic diastolic CHF (congestive heart failure) ([place])     #1 symptomatic anemia: Patient will be admitted.  Transfused 2 units of packed red blood cells.  Monitor H&H.  IV Protonix.  Reconsult GI in the morning.  Patient noted to be on aspirin and naproxen.  We will hold them in the setting of GI bleed possibility..  #2 possible GI bleed: Patient has just had GI work-up.  We will transfuse and defer to GI for continued work-up either inpatient or outpatient.  #3 essential hypertension: We will resume amlodipine and metoprolol.  Also hydralazine and Imdur.  #4 chronic kidney disease stage IV: Not yet requiring hemodialysis.  Defer to nephrology.  #5 tobacco abuse: Initial nicotine patch.  #6 GERD:  Continue with Protonix.  #7 hyperlipidemia: On Lipitor.  We will continue  #8 diabetes: Noninsulin-dependent.  Sliding scale insulin.  Continue to monitor   DVT prophylaxis: SCD Code Status: Full code Family Communication: No family at bedside Disposition Plan: Home Consults called: None Admission status: Observation  Severity of Illness: The appropriate patient status for this patient is OBSERVATION. Observation status is judged to be reasonable and necessary in order to provide the required intensity of service to ensure the patient's safety. The patient's presenting symptoms, physical exam findings, and initial radiographic and laboratory data in the context of their medical condition is felt to place them at decreased risk for further clinical deterioration. Furthermore, it is anticipated that the patient will be medically stable for discharge from the hospital within 2 midnights of admission. The following factors support the patient status of observation.   " The patient's presenting symptoms include shortness of breath and weakness. " The physical exam findings include pale conjunctiva. " The initial radiographic and laboratory data are hemoglobin 5.7.     Barbette Merino MD [facility] Pager [phone]  If 7PM-7AM, please contact night-coverage [email or web address] Password [email or web address]  06/27/2020, 12:04 AM

## 2020-06-27 NOTE — Progress Notes (Signed)
On call MD notified of Hgb of 5.2. Orders received to transfuse 2units of PRBCs. Emergency Dept MD explained to patient the possibility of needing and receiving blood during this admission. Blood consent signed and placed on chart.

## 2020-06-27 NOTE — ED Notes (Signed)
RN attempted to call report x1 

## 2020-06-27 NOTE — Progress Notes (Signed)
  PROGRESS NOTE  Patient admitted earlier this morning. See H&P.   [name] is a 70 y.o. female with medical history significant of recurrent anemia secondary to suspected GI bleed and chronic kidney disease, chronic kidney disease stage IV, hypertension, GERD, hyperlipidemia, non-insulin-dependent diabetes, who was recently hospitalized twice first April 25 to 29 and then May 11 to 18 both with a diagnosis of symptomatic anemia possibly GI bleed, then again with CHF. She had undergone EGD and colonoscopy recently, no source of bleed found.   Patient now returns with SOB, weakness, and hemoglobin of 5.2 (was 8.9 when she left the hospital on May 18).  She denied any melena.  No bright red blood per rectum.  She has been taking aspirin 325mg  daily and took 1 aleve 2 days ago for arthritis.   A/P:  Symptomatic anemia, concern for GI bleeding -Recent EGD in 04/2020, colonoscopy 05/2020 without source of bleeding found -FOBT pending -Transfuse 2u prbc to maintain hemoglobin >7 -GI consulted  -Hold aspirin, NSAID -PPI   Essential hypertension -Continue amlodipine, metoprolol, hydralazine, Imdur, minoxidil   CKD stage IV -Followed by Dr. Joelyn Oms at [facility] -Creatinine stable from recent admission  Chronic diastolic heart failure -Without signs of fluid overload -Continue torsemide  Hyperlipidemia -Continue Lipitor  Diabetes mellitus -Continue sliding scale insulin   Status is: Observation  The patient will require care spanning > 2 midnights and should be moved to inpatient because: IV treatments appropriate due to intensity of illness or inability to take PO and Inpatient level of care appropriate due to severity of illness  Dispo: The patient is from: Home              Anticipated d/c is to: Home              Patient currently is not medically stable to d/c.   Difficult to place patient No       Dessa Phi, DO [facility] 06/27/2020,  10:26 AM  Available via Epic secure chat 7am-7pm After these hours, please refer to coverage provider listed on [email or web address]

## 2020-06-28 ENCOUNTER — Encounter (HOSPITAL_COMMUNITY)
Admission: EM | Disposition: A | Discharge status: Home Health | Payer: Self-pay | Source: Home / Self Care | Attending: Internal Medicine

## 2020-06-28 DIAGNOSIS — Z833 Family history of diabetes mellitus: Secondary | ICD-10-CM | POA: Diagnosis not present

## 2020-06-28 DIAGNOSIS — I130 Hypertensive heart and chronic kidney disease with heart failure and stage 1 through stage 4 chronic kidney disease, or unspecified chronic kidney disease: Secondary | ICD-10-CM | POA: Diagnosis present

## 2020-06-28 DIAGNOSIS — Z8249 Family history of ischemic heart disease and other diseases of the circulatory system: Secondary | ICD-10-CM | POA: Diagnosis not present

## 2020-06-28 DIAGNOSIS — D125 Benign neoplasm of sigmoid colon: Secondary | ICD-10-CM | POA: Diagnosis present

## 2020-06-28 DIAGNOSIS — E876 Hypokalemia: Secondary | ICD-10-CM | POA: Diagnosis present

## 2020-06-28 DIAGNOSIS — R195 Other fecal abnormalities: Secondary | ICD-10-CM | POA: Diagnosis not present

## 2020-06-28 DIAGNOSIS — R011 Cardiac murmur, unspecified: Secondary | ICD-10-CM | POA: Diagnosis present

## 2020-06-28 DIAGNOSIS — Z20822 Contact with and (suspected) exposure to covid-19: Secondary | ICD-10-CM | POA: Diagnosis present

## 2020-06-28 DIAGNOSIS — E1122 Type 2 diabetes mellitus with diabetic chronic kidney disease: Secondary | ICD-10-CM | POA: Diagnosis present

## 2020-06-28 DIAGNOSIS — Z90710 Acquired absence of both cervix and uterus: Secondary | ICD-10-CM | POA: Diagnosis not present

## 2020-06-28 DIAGNOSIS — Z8719 Personal history of other diseases of the digestive system: Secondary | ICD-10-CM | POA: Diagnosis not present

## 2020-06-28 DIAGNOSIS — D649 Anemia, unspecified: Secondary | ICD-10-CM | POA: Diagnosis not present

## 2020-06-28 DIAGNOSIS — K648 Other hemorrhoids: Secondary | ICD-10-CM | POA: Diagnosis present

## 2020-06-28 DIAGNOSIS — K922 Gastrointestinal hemorrhage, unspecified: Secondary | ICD-10-CM | POA: Diagnosis present

## 2020-06-28 DIAGNOSIS — E785 Hyperlipidemia, unspecified: Secondary | ICD-10-CM | POA: Diagnosis present

## 2020-06-28 DIAGNOSIS — R0602 Shortness of breath: Secondary | ICD-10-CM | POA: Diagnosis present

## 2020-06-28 DIAGNOSIS — D631 Anemia in chronic kidney disease: Secondary | ICD-10-CM | POA: Diagnosis present

## 2020-06-28 DIAGNOSIS — G8929 Other chronic pain: Secondary | ICD-10-CM | POA: Diagnosis present

## 2020-06-28 DIAGNOSIS — Z83438 Family history of other disorder of lipoprotein metabolism and other lipidemia: Secondary | ICD-10-CM | POA: Diagnosis not present

## 2020-06-28 DIAGNOSIS — I5033 Acute on chronic diastolic (congestive) heart failure: Secondary | ICD-10-CM | POA: Diagnosis present

## 2020-06-28 DIAGNOSIS — E0590 Thyrotoxicosis, unspecified without thyrotoxic crisis or storm: Secondary | ICD-10-CM | POA: Diagnosis present

## 2020-06-28 DIAGNOSIS — Z79899 Other long term (current) drug therapy: Secondary | ICD-10-CM | POA: Diagnosis not present

## 2020-06-28 DIAGNOSIS — Z7982 Long term (current) use of aspirin: Secondary | ICD-10-CM | POA: Diagnosis not present

## 2020-06-28 DIAGNOSIS — Z888 Allergy status to other drugs, medicaments and biological substances status: Secondary | ICD-10-CM | POA: Diagnosis not present

## 2020-06-28 DIAGNOSIS — Z87891 Personal history of nicotine dependence: Secondary | ICD-10-CM | POA: Diagnosis not present

## 2020-06-28 DIAGNOSIS — K219 Gastro-esophageal reflux disease without esophagitis: Secondary | ICD-10-CM | POA: Diagnosis present

## 2020-06-28 DIAGNOSIS — N184 Chronic kidney disease, stage 4 (severe): Secondary | ICD-10-CM | POA: Diagnosis present

## 2020-06-28 DIAGNOSIS — K558 Other vascular disorders of intestine: Secondary | ICD-10-CM | POA: Diagnosis not present

## 2020-06-28 HISTORY — PX: GIVENS CAPSULE STUDY: SHX5432

## 2020-06-28 LAB — GLUCOSE, CAPILLARY
Glucose-Capillary: 94 mg/dL (ref 70–99)
Glucose-Capillary: 78 mg/dL (ref 70–99)
Glucose-Capillary: 117 mg/dL — ABNORMAL HIGH (ref 70–99)
Glucose-Capillary: 213 mg/dL — ABNORMAL HIGH (ref 70–99)
Glucose-Capillary: 161 mg/dL — ABNORMAL HIGH (ref 70–99)

## 2020-06-28 LAB — CBC
HCT: 27.7 % — ABNORMAL LOW (ref 36.0–46.0)
Hemoglobin: 9.4 g/dL — ABNORMAL LOW (ref 12.0–15.0)
MCH: 31.1 pg (ref 26.0–34.0)
MCHC: 33.9 g/dL (ref 30.0–36.0)
MCV: 91.7 fL (ref 80.0–100.0)
Platelets: 220 10*3/uL (ref 150–400)
RBC: 3.02 MIL/uL — ABNORMAL LOW (ref 3.87–5.11)
RDW: 17.1 % — ABNORMAL HIGH (ref 11.5–15.5)
WBC: 7.5 10*3/uL (ref 4.0–10.5)
nRBC: 0.7 % — ABNORMAL HIGH (ref 0.0–0.2)

## 2020-06-28 LAB — BASIC METABOLIC PANEL
Anion gap: 14 (ref 5–15)
BUN: 93 mg/dL — ABNORMAL HIGH (ref 8–23)
CO2: 23 mmol/L (ref 22–32)
Calcium: 8.8 mg/dL — ABNORMAL LOW (ref 8.9–10.3)
Chloride: 100 mmol/L (ref 98–111)
Creatinine, Ser: 4.15 mg/dL — ABNORMAL HIGH (ref 0.44–1.00)
GFR, Estimated: 11 mL/min — ABNORMAL LOW (ref >60)
Glucose, Bld: 89 mg/dL (ref 70–99)
Potassium: 3.7 mmol/L (ref 3.5–5.1)
Sodium: 137 mmol/L (ref 135–145)

## 2020-06-28 SURGERY — IMAGING PROCEDURE, GI TRACT, INTRALUMINAL, VIA CAPSULE
Anesthesia: LOCAL

## 2020-06-28 SURGICAL SUPPLY — 1 items: TOWEL COTTON PACK 4EA (MISCELLANEOUS) ×4 IMPLANT

## 2020-06-28 NOTE — Progress Notes (Addendum)
PROGRESS NOTE    [name]  EXB:[id number] DOB: 03/31/1950 DOA: 06/26/2020 PCP: Erby Pian, PA-C     Brief Narrative:  [name] is a 70 y.o.femalewith medical history significant ofrecurrent anemia secondary to suspected GI bleed and chronic kidney disease, chronic kidney disease stage IV, hypertension, GERD, hyperlipidemia, non-insulin-dependent diabetes, who was recently hospitalized twice first April 25 to 29 and then May 11 to 18 both with a diagnosis of symptomatic anemia possibly GI bleed, then again with CHF. She had undergone EGD and colonoscopy recently, no source of bleed found.   Patient now returns with SOB, weakness, and hemoglobin of 5.2 (was 8.9 when she left the hospital on May 18). She denied any melena. No bright red blood per rectum. She has been taking aspirin 325mg  daily and took 1 aleve 2 days ago for arthritis.  She was given 2 unit packed red blood cell transfusion, GI consulted.  New events last 24 hours / Subjective: Patient was started on capsule endoscopy this morning.  Patient has no new complaints today, denies any shortness of breath  Assessment & Plan:   Principal Problem:   Symptomatic anemia Active Problems:   Hyperthyroidism   Hyperlipidemia LDL goal <70   TOBACCO ABUSE   Type 2 diabetes mellitus with stage 4 chronic kidney disease, without long-term current use of insulin (HCC)   GI bleed   CKD (chronic kidney disease), stage IV (HCC)   GERD without esophagitis   Acute on chronic diastolic CHF (congestive heart failure) (HCC)   Symptomatic anemia, in setting of chronic kidney disease, also with concern for GI bleeding -Recent EGD in 04/2020, colonoscopy 05/2020 without source of bleeding found -FOBT positive -Status post 2 unit packed red blood cell -GI following, undergoing capsule endoscopy today -Hold aspirin, NSAID -PPI  -Hemoglobin stable this morning  Essential hypertension -Continue amlodipine,  metoprolol, hydralazine, Imdur, minoxidil   CKD stage IV -Followed by Dr. Joelyn Oms at [facility] -Creatinine stable from recent admission  Chronic diastolic heart failure -Without signs of fluid overload -Continue torsemide  Hyperlipidemia -Continue Lipitor  Diabetes mellitus -Continue sliding scale insulin    DVT prophylaxis:  SCDs Start: 06/27/20 0008  Code Status:     Code Status Orders  (From admission, onward)         Start     Ordered   06/27/20 0008  Full code  Continuous        06/27/20 0007        Code Status History    Date Active Date Inactive Code Status Order ID Comments User Context   06/03/2020 0737 06/10/2020 1739 Full Code [id number]  Vernelle Emerald, MD ED   05/20/2020 2255 05/22/2020 2212 Full Code [id number]  Reubin Milan, MD Inpatient   05/19/2020 0601 05/20/2020 2255 Full Code [id number]  Chotiner, Yevonne Aline, MD ED   11/28/2019 0208 12/03/2019 1906 Full Code [id number]  Edmonia Lynch, DO ED   09/09/2017 0627 09/09/2017 2259 Full Code [id number]  Rise Patience, MD Inpatient   01/08/2015 1807 01/10/2015 2108 Full Code [id number]  Radene Gunning, NP Inpatient   09/13/2013 1546 09/15/2013 1707 Full Code [id number]  Delfina Redwood, MD Inpatient   Advance Care Planning Activity     Family Communication: No family at bedside Disposition Plan:  Status is: Observation  The patient will require care spanning > 2 midnights and should be moved to inpatient because: Ongoing diagnostic testing needed not appropriate for outpatient work up  Dispo: The patient is from: Home              Anticipated d/c is to: Home              Patient currently is not medically stable to d/c.  Undergoing capsule endoscopy this morning.   Difficult to place patient No      Consultants:   GI  Procedures:   None  Antimicrobials:  Anti-infectives (From admission, onward)   None        Objective: Vitals:   06/27/20 2126 06/28/20  0020 06/28/20 0340 06/28/20 0722  BP: (!) 146/41 (!) 122/53 (!) 130/44   Pulse: 69 70 77   Resp: 17 18 16    Temp: 98.5 F (36.9 C) 98.6 F (37 C) 98.6 F (37 C)   TempSrc: Oral Oral Oral   SpO2: 95% 95% 96%   Weight:  57.3 kg  57.2 kg  Height:    5\' 2"  (1.575 m)    Intake/Output Summary (Last 24 hours) at 06/28/2020 0937 Last data filed at 06/28/2020 0600 Gross per 24 hour  Intake 1215 ml  Output 1100 ml  Net 115 ml   Filed Weights   06/27/20 0234 06/28/20 0020 06/28/20 0722  Weight: 57.4 kg 57.3 kg 57.2 kg    Examination:  General exam: Appears calm and comfortable  Respiratory system: Clear to auscultation. Respiratory effort normal. No respiratory distress. No conversational dyspnea.  Remains on room air Cardiovascular system: S1 & S2 heard, RRR. No murmurs. No pedal edema. Gastrointestinal system: Abdomen is nondistended, soft and nontender. Normal bowel sounds heard. Central nervous system: Alert and oriented. No focal neurological deficits. Speech clear.  Extremities: Symmetric in appearance  Skin: No rashes, lesions or ulcers on exposed skin  Psychiatry: Judgement and insight appear normal. Mood & affect appropriate.   Data Reviewed: I have personally reviewed following labs and imaging studies  CBC: Recent Labs  Lab 06/26/20 1655 06/27/20 0204 06/27/20 1413 06/27/20 1641 06/27/20 2012 06/28/20 0805  WBC 5.4 4.7 6.0 6.1 6.9 7.5  NEUTROABS 3.7  --   --   --   --   --   HGB 5.7* 5.2* 8.8* 8.7* 9.8* 9.4*  HCT 18.3* 16.6* 25.9* 25.4* 29.4* 27.7*  MCV 99.5 98.2 92.5 92.0 92.5 91.7  PLT 196 180 193 190 233 009   Basic Metabolic Panel: Recent Labs  Lab 06/26/20 1435 06/26/20 1655 06/27/20 1413 06/28/20 0805  NA 135 136 137 137  K 3.8 3.9 3.7 3.7  CL 98 102 101 100  CO2 23 25 24 23   GLUCOSE 288* 160* 77 89  BUN 98* 99* 101* 93*  CREATININE 4.49* 4.32* 4.36* 4.15*  CALCIUM 8.1*  8.0* 8.2* 8.3* 8.8*  PHOS 5.5*  --   --   --    GFR: Estimated  Creatinine Clearance: 10 mL/min (A) (by C-G formula based on SCr of 4.15 mg/dL (H)). Liver Function Tests: Recent Labs  Lab 06/26/20 1435 06/27/20 1413  AST  --  16  ALT  --  22  ALKPHOS  --  57  BILITOT  --  2.0*  PROT  --  6.4*  ALBUMIN 2.9* 2.9*   No results for input(s): LIPASE, AMYLASE in the last 168 hours. No results for input(s): AMMONIA in the last 168 hours. Coagulation Profile: No results for input(s): INR, PROTIME in the last 168 hours. Cardiac Enzymes: No results for input(s): CKTOTAL, CKMB, CKMBINDEX, TROPONINI in the last 168 hours. BNP (last 3  results) No results for input(s): PROBNP in the last 8760 hours. HbA1C: No results for input(s): HGBA1C in the last 72 hours. CBG: Recent Labs  Lab 06/27/20 1504 06/27/20 1535 06/27/20 2127 06/28/20 0342 06/28/20 0619  GLUCAP 67* 99 92 94 78   Lipid Profile: No results for input(s): CHOL, HDL, LDLCALC, TRIG, CHOLHDL, LDLDIRECT in the last 72 hours. Thyroid Function Tests: No results for input(s): TSH, T4TOTAL, FREET4, T3FREE, THYROIDAB in the last 72 hours. Anemia Panel: Recent Labs    06/26/20 1435  FERRITIN 392*  TIBC 217*  IRON 21*   Sepsis Labs: No results for input(s): PROCALCITON, LATICACIDVEN in the last 168 hours.  Recent Results (from the past 240 hour(s))  SARS CORONAVIRUS 2 (TAT 6-24 HRS) Nasopharyngeal Nasopharyngeal Swab     Status: None   Collection Time: 06/26/20 11:34 PM   Specimen: Nasopharyngeal Swab  Result Value Ref Range Status   SARS Coronavirus 2 NEGATIVE NEGATIVE Final    Comment: (NOTE) SARS-CoV-2 target nucleic acids are NOT DETECTED.  The SARS-CoV-2 RNA is generally detectable in upper and lower respiratory specimens during the acute phase of infection. Negative results do not preclude SARS-CoV-2 infection, do not rule out co-infections with other pathogens, and should not be used as the sole basis for treatment or other patient management decisions. Negative results must  be combined with clinical observations, patient history, and epidemiological information. The expected result is Negative.  Fact Sheet for Patients: SugarRoll.be  Fact Sheet for Healthcare Providers: https://www.woods-mathews.com/  This test is not yet approved or cleared by the [place] FDA and  has been authorized for detection and/or diagnosis of SARS-CoV-2 by FDA under an Emergency Use Authorization (EUA). This EUA will remain  in effect (meaning this test can be used) for the duration of the COVID-19 declaration under Se ction 564(b)(1) of the Act, 21 U.S.C. section 360bbb-3(b)(1), unless the authorization is terminated or revoked sooner.  Performed at [facility], [place] [address], [place], [place] [address]       Radiology Studies: DG Chest Portable 1 View  Result Date: 06/26/2020 CLINICAL DATA:  Shortness of breath and anemia EXAM: PORTABLE CHEST 1 VIEW COMPARISON:  Jun 05, 2020 FINDINGS: There is slight left lower lobe atelectasis. Lungs elsewhere clear. Heart is borderline prominent with pulmonary vascularity normal. No adenopathy. There is aortic sclerosis. No bone lesions. IMPRESSION: Stable borderline cardiac prominence. Mild left lower lobe atelectasis. Lungs otherwise clear. Aortic Atherosclerosis (ICD10-I70.0). Electronically Signed   By: Lowella Grip III M.D.   On: 06/26/2020 17:22      Scheduled Meds: . allopurinol  100 mg Oral Daily  . amLODipine  10 mg Oral Daily  . atorvastatin  80 mg Oral Daily  . bromocriptine  1.25 mg Oral Daily  . hydrALAZINE  50 mg Oral Q8H  . insulin aspart  0-5 Units Subcutaneous QHS  . insulin aspart  0-6 Units Subcutaneous TID WC  . isosorbide mononitrate  30 mg Oral Daily  . metoprolol succinate  100 mg Oral Daily  . minoxidil  10 mg Oral BID  . nicotine  21 mg Transdermal Daily  . pantoprazole (PROTONIX) IV  40 mg Intravenous Q12H  . torsemide  80 mg Oral BID    Continuous Infusions:   LOS: 0 days      Time spent: 20 minutes   Dessa Phi, DO [facility] 06/28/2020, 9:37 AM   Available via Epic secure chat 7am-7pm After these hours, please refer to coverage provider listed  on [email or web address]

## 2020-06-28 NOTE — Progress Notes (Addendum)
[facility] Progress Note  CC:  Symptomatic anemia  Subjective:  Feels fine.  No sign of active bleeding.  VCE is in process.  Objective:  Vital signs in last 24 hours: Temp:  [98.1 F (36.7 C)-99 F (37.2 C)] 99 F (37.2 C) (06/05 0722) Pulse Rate:  [66-81] 81 (06/05 0722) Resp:  [16-18] 18 (06/05 0722) BP: (108-146)/(34-53) 133/40 (06/05 0722) SpO2:  [94 %-100 %] 94 % (06/05 0722) Weight:  [57.2 kg-57.3 kg] 57.2 kg (06/05 0722) Last BM Date: 06/27/20 General:  Alert, Well-developed, in NAD Heart:  Regular rate and rhythm; no murmurs Pulm:  CTAB.  No W/R/R. Abdomen:  Soft, non-distended.  BS present.  Non-tender. Extremities:  Without edema. Neurologic:  Alert and oriented x 4;  grossly normal neurologically. Psych:  Alert and cooperative. Normal mood and affect.  Intake/Output from previous day: 06/04 0701 - 06/05 0700 In: 1820 [P.O.:1020; I.V.:20; Blood:780] Out: 1300 [Urine:1300]   Lab Results: Recent Labs    06/27/20 1641 06/27/20 2012 06/28/20 0805  WBC 6.1 6.9 7.5  HGB 8.7* 9.8* 9.4*  HCT 25.4* 29.4* 27.7*  PLT 190 233 220   BMET Recent Labs    06/26/20 1655 06/27/20 1413 06/28/20 0805  NA 136 137 137  K 3.9 3.7 3.7  CL 102 101 100  CO2 25 24 23   GLUCOSE 160* 77 89  BUN 99* 101* 93*  CREATININE 4.32* 4.36* 4.15*  CALCIUM 8.2* 8.3* 8.8*   LFT Recent Labs    06/27/20 1413  PROT 6.4*  ALBUMIN 2.9*  AST 16  ALT 22  ALKPHOS 57  BILITOT 2.0*   DG Chest Portable 1 View  Result Date: 06/26/2020 CLINICAL DATA:  Shortness of breath and anemia EXAM: PORTABLE CHEST 1 VIEW COMPARISON:  Jun 05, 2020 FINDINGS: There is slight left lower lobe atelectasis. Lungs elsewhere clear. Heart is borderline prominent with pulmonary vascularity normal. No adenopathy. There is aortic sclerosis. No bone lesions. IMPRESSION: Stable borderline cardiac prominence. Mild left lower lobe atelectasis. Lungs otherwise clear. Aortic Atherosclerosis  (ICD10-I70.0). Electronically Signed   By: Lowella Grip III M.D.   On: 06/26/2020 17:22   Assessment / Plan: *Recurrent acute on chronic anemia with hemoccult positive stools:  Hgb 5.2 grams as compared to 8.9 grams just 2 weeks ago.  Received 2 units PRBCs and Hgb responded well up to 8.8 grams and now 9.4 grams today.  Receiving Epogen regularly and Feraheme periodically.  Iron studies most recently c/w AOCD. *Advanced CKD:  Receives Epogen.  This is likely playing a big role in her anemia issues.  -Can likely be discharged on 6/6 AM if no sign of acute bleeding and Hgb remains stable.   -Will follow-up results of capsule study as outpatient. -Outpatient virtual colonoscopy looking for polyps/CRC screening by her primary GI, Dr. Rolm Bookbinder.   LOS: 0 days   Laban Emperor. Zehr  06/28/2020, 11:19 AM  GI ATTENDING  Interval history data reviewed.  Agree with interval progress note as outlined above.  No obvious GI bleeding.  Hemoglobin stable.  Capsule endoscopy underway.  Agree the patient can go home tomorrow if hemoglobin stable without overt bleeding.  We will be in touch regarding capsule endoscopy findings.  She should resume care with her primary GI provider who is planning on virtual colonoscopy.  As noted previously, she may benefit from hematology assessment and management moving forward as her chronic recurrent anemia is out of proportion to be accounted for on the basis of  GI blood loss alone.  Docia Chuck. Geri Seminole., M.D. [facility] Division of Gastroenterology

## 2020-06-28 NOTE — Progress Notes (Signed)
Per instructions, Lead belt and monitor for capsule endoscopy removed.

## 2020-06-28 NOTE — Progress Notes (Signed)
Pt ingested pill camera at 0730; tolerated well. Instructions reviewed with patient and bedside RN Clarise Cruz. Instruction sheet left on computer desk at patient's bedside.

## 2020-06-28 NOTE — Care Management Obs Status (Signed)
[facility] NOTIFICATION   Patient Details  Name: [name] MRN: [id number] Date of Birth: [birth year]   Medicare Observation Status Notification Given:  Yes    Bethena Roys, RN 06/28/2020, 8:10 AM

## 2020-06-29 ENCOUNTER — Encounter (HOSPITAL_COMMUNITY): Payer: Self-pay | Admitting: Internal Medicine

## 2020-06-29 DIAGNOSIS — K558 Other vascular disorders of intestine: Secondary | ICD-10-CM

## 2020-06-29 DIAGNOSIS — K922 Gastrointestinal hemorrhage, unspecified: Secondary | ICD-10-CM

## 2020-06-29 LAB — BASIC METABOLIC PANEL
Anion gap: 13 (ref 5–15)
BUN: 92 mg/dL — ABNORMAL HIGH (ref 8–23)
CO2: 24 mmol/L (ref 22–32)
Calcium: 9 mg/dL (ref 8.9–10.3)
Chloride: 101 mmol/L (ref 98–111)
Creatinine, Ser: 4.16 mg/dL — ABNORMAL HIGH (ref 0.44–1.00)
GFR, Estimated: 11 mL/min — ABNORMAL LOW (ref >60)
Glucose, Bld: 107 mg/dL — ABNORMAL HIGH (ref 70–99)
Potassium: 3.4 mmol/L — ABNORMAL LOW (ref 3.5–5.1)
Sodium: 138 mmol/L (ref 135–145)

## 2020-06-29 LAB — CBC
HCT: 28 % — ABNORMAL LOW (ref 36.0–46.0)
Hemoglobin: 9.3 g/dL — ABNORMAL LOW (ref 12.0–15.0)
MCH: 30.6 pg (ref 26.0–34.0)
MCHC: 33.2 g/dL (ref 30.0–36.0)
MCV: 92.1 fL (ref 80.0–100.0)
Platelets: 230 10*3/uL (ref 150–400)
RBC: 3.04 MIL/uL — ABNORMAL LOW (ref 3.87–5.11)
RDW: 16.7 % — ABNORMAL HIGH (ref 11.5–15.5)
WBC: 6.5 10*3/uL (ref 4.0–10.5)
nRBC: 1.2 % — ABNORMAL HIGH (ref 0.0–0.2)

## 2020-06-29 LAB — POCT HEMOGLOBIN-HEMACUE: Hemoglobin: 6 g/dL — CL (ref 12.0–15.0)

## 2020-06-29 LAB — GLUCOSE, CAPILLARY
Glucose-Capillary: 118 mg/dL — ABNORMAL HIGH (ref 70–99)
Glucose-Capillary: 78 mg/dL (ref 70–99)
Glucose-Capillary: 86 mg/dL (ref 70–99)

## 2020-06-29 MED ORDER — PANTOPRAZOLE SODIUM 40 MG PO TBEC: 40.0000 mg | DELAYED_RELEASE_TABLET | Freq: Two times a day (BID) | ORAL | Status: DC

## 2020-06-29 MED ORDER — POTASSIUM CHLORIDE CRYS ER 20 MEQ PO TBCR
40.0000 meq | EXTENDED_RELEASE_TABLET | Freq: Once | ORAL | Status: AC
  Administered 2020-06-29: 40 meq via ORAL
  Filled 2020-06-29: qty 2

## 2020-06-29 NOTE — Plan of Care (Signed)

## 2020-06-29 NOTE — TOC Transition Note (Signed)
Transition of Care [facility]) - CM/SW Discharge Note   Patient Details  Name: LYANA ASBILL MRN: [id number] Date of Birth: 09-07-1950  Transition of Care [facility]) CM/SW Contact:  Zenon Mayo, RN Phone Number: 06/29/2020, 10:19 AM   Clinical Narrative:    From home alone, but her sister, [name] stays one house down from her and can assist her when needed. She states she has a walker /cane at home. She still drives and [name] sometimes take her to her MD apts. NCM spoke with patient, she would like to stay with [facility] for [place], [place], [place].Patient is for dc today,  NCM notified MD to put orders in for [facility] services.      Final next level of care: [place] Barriers to Discharge: No Barriers Identified   Patient Goals and CMS Choice Patient states their goals for this hospitalization and ongoing recovery are:: return home CMS Medicare.gov Compare Post Acute Care list provided to:: Patient Choice offered to / list presented to : Patient  Discharge Placement                       Discharge Plan and Services In-house Referral: NA Discharge Planning Services: CM Consult Post Acute Care Choice: Resumption of Svcs/PTA Provider,Home Health            DME Agency: NA       HH Arranged: RN,PT,OT [place] Agency: [place] ([facility]) Date IXL: 06/29/20 Time [place]: 1012 Representative spoke with at [place]: [place] ([place]) Interventions     Readmission Risk Interventions Readmission Risk Prevention Plan 06/29/2020 06/08/2020 05/20/2020  Transportation Screening Complete Complete Complete  PCP or Specialist Appt within 3-5 Days Complete Complete -  HRI or Home Care Consult Complete Complete Complete  Social Work Consult for [place] Planning/Counseling Complete Complete Complete  Palliative Care Screening Not Applicable Not Applicable Not Applicable  Medication Review [employer] photographer)  Complete Complete -  Some recent data might be hidden

## 2020-06-29 NOTE — Progress Notes (Signed)
Cardiology Clinic Note   Patient Name: [name] Date of Encounter: 06/30/2020  Primary Care Provider:  Erby Pian, PA-C Primary Cardiologist:  Pixie Casino, MD  Patient Profile    [name] 70 year old female presents the clinic today for follow-up evaluation of her hypertension and acute on chronic diastolic CHF.  Past Medical History    Past Medical History:  Diagnosis Date  . Acute kidney injury ([place]) 01/08/2015  . Arthritis   . Chronic back pain   . Constipation   . Cough   . Diabetes mellitus, type 2 ([place])   . Diverticulosis   . Fibroid    patient thinks this was the reason for her hysterectomy  . GERD (gastroesophageal reflux disease)   . Heart murmur   . History of sebaceous cyst   . Hyperlipemia   . Hyperplastic colon polyp   . Hypertension   . Hyperthyroidism   . Lumbar radiculopathy   . Shortness of breath 09/13/2013  . Stroke ([place]) 2015?   x4   . Tobacco abuse   . 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 ABDOMINAL HYSTERECTOMY     --?ovaries remain  . COLONOSCOPY W/ BIOPSIES  09/11/2008  . ESOPHAGOGASTRODUODENOSCOPY (EGD) WITH PROPOFOL N/A 05/20/2020   Procedure: ESOPHAGOGASTRODUODENOSCOPY (EGD) WITH PROPOFOL;  Surgeon: Irene Shipper, MD;  Location: [facility] ENDOSCOPY;  Service: Endoscopy;  Laterality: N/A;  . GIVENS CAPSULE STUDY N/A 06/28/2020   Procedure: GIVENS CAPSULE STUDY;  Surgeon: Irene Shipper, MD;  Location: [facility];  Service: Endoscopy;  Laterality: N/A;  . RETINOPATHY SURGERY Bilateral 2013   Dr. Zigmund Daniel    Allergies  Allergies  Allergen Reactions  . Semaglutide Rash    Rybelsus 3 mg    History of Present Illness    [name] has a PMH of type II diabetes melitis, hypertension, CVA, acute on chronic diastolic CHF, GERD, CKD stage IV, tobacco abuse, HLD, systolic murmur, vitamin D deficiency, and anemia  secondary to GI bleed.  She was hospitalized April 25-29 and then May 11-18 with diagnosis of symptomatic anemia possibly related to GI bleeding and again with CHF.  She underwent EGD and colonoscopy which showed no source of bleeding.  She returned to the emergency department 06/27/2020 with shortness of breath, weakness, and hemoglobin of 5.2.  She had a hemoglobin of 8.9 at discharge on May 18.  She denied melena and bright red rectal bleeding.  She reported having taken aspirin 325 daily and Aleve 2 days prior to her admission.  She received 2 units of PRBCs.  She underwent capsule endoscopy which was pending at the time of her discharge.  Her aspirin and NSAID were held.  She was not noted to have bleeding during her hospital stay.  She remained stable and was discharged 06/29/2020.  She was last seen by cardiology on 06/08/2020 mild been in the hospital.  She was treated for acute on chronic diastolic CHF.  Her echocardiogram showed hypertensive heart disease.  There was low suspicion for cardiac amyloid.  Her troponin levels were minimally elevated which was felt to be related to diastolic heart failure and hypertensive crisis.  She was also noted to have mild aortic stenosis.  Outpatient follow-up was planned however unfortunately, she was readmitted to the hospital.  Of note work-up for amyloid was discussed but deferred to Dr. Lysbeth Penner recommendations.  She presents in the  clinic today for follow-up evaluation states she feels well.  We reviewed her endoscopy report and her echocardiogram.  She was discharged from the hospital 06/29/2020.  We reviewed heart healthy low-sodium diet and the importance of daily weights.  We will give her the salty 6 diet sheet, have her increase her physical activity as tolerated, and follow-up in 3 months.  We reviewed her occult stool which was positive and I instructed her to follow-up with GI.  Today she denies chest pain, shortness of breath, lower extremity edema,  fatigue, palpitations, melena, hematuria, hemoptysis, diaphoresis, weakness, presyncope, syncope, orthopnea, and PND.   Home Medications    Prior to Admission medications   Medication Sig Start Date End Date Taking? Authorizing Provider  acarbose (PRECOSE) 25 MG tablet Take 0.5 tablets (12.5 mg total) by mouth 3 (three) times daily with meals. 02/25/20   Renato Shin, MD  acetaminophen (TYLENOL) 500 MG tablet Take 1,000 mg by mouth every 6 (six) hours as needed for moderate pain.    [provider]  allopurinol (ZYLOPRIM) 100 MG tablet Take 1 tablet (100 mg total) by mouth daily. 05/23/20   Ghimire, Henreitta Leber, MD  amLODipine (NORVASC) 10 MG tablet TAKE 1 TABLET BY MOUTH EVERY DAY Patient taking differently: Take 10 mg by mouth daily. 03/16/20   Marrian Salvage, FNP  aspirin 325 MG tablet Take 325 mg by mouth daily.    [provider]  atorvastatin (LIPITOR) 80 MG tablet TAKE 1 TABLET BY MOUTH EVERY DAY Patient taking differently: Take 80 mg by mouth daily. 03/30/20   Marrian Salvage, FNP  Blood Glucose Monitoring Suppl ([place]) w/Device KIT 1 each by Does not apply route 2 (two) times daily. E11.9    [provider]  bromocriptine (PARLODEL) 2.5 MG tablet Take 0.5 tablets (1.25 mg total) by mouth daily. 02/24/20   Renato Shin, MD  epoetin alfa-epbx (RETACRIT) 73419 UNIT/ML injection Inject 40,000 Units into the skin every 28 (twenty-eight) days.    [provider]  glucose blood (ONETOUCH VERIO) test strip USE AS DIRECTED TWICE DAILY 12/12/19   Marrian Salvage, FNP  hydrALAZINE (APRESOLINE) 50 MG tablet Take 1 tablet (50 mg total) by mouth every 8 (eight) hours. 06/10/20 07/10/20  Dessa Phi, DO  isosorbide mononitrate (IMDUR) 30 MG 24 hr tablet Take 1 tablet (30 mg total) by mouth daily. 06/11/20   Dessa Phi, DO  metoprolol succinate (TOPROL-XL) 100 MG 24 hr tablet Take 100 mg by mouth daily.    [provider]  minoxidil (LONITEN) 10 MG tablet Take 10 mg by mouth 2 (two) times daily.    [provider]  naproxen sodium (ALEVE) 220 MG tablet Take 220 mg by mouth daily as needed (pain).    [provider]  OneTouch Delica Lancets 37T MISC 1 each by Does not apply route 2 (two) times daily. E11.9    [provider]  pantoprazole (PROTONIX) 40 MG tablet Take 1 tablet (40 mg total) by mouth daily. 06/12/19   Marrian Salvage, FNP  polyethylene glycol (MIRALAX / GLYCOLAX) 17 g packet Take 34-51 g by mouth daily as needed for mild constipation.    [provider]  repaglinide (PRANDIN) 2 MG tablet Take 1 tablet (2 mg total) by mouth 2 (two) times daily before a meal. 01/10/20   Renato Shin, MD  torsemide (DEMADEX) 20 MG tablet Take 4 tablets (80 mg total) by mouth 2 (two) times daily. 06/10/20 07/10/20  Dessa Phi, DO    Family History    Family History  Problem Relation Age of Onset  . Hypertension Mother   . Sleep apnea Mother   . Diabetes Mother   . Hyperlipidemia Mother   . Lung cancer Father        smoker  . Hypertension Sister   . Diabetes Sister   . Hypertension Brother   . Hypertension Sister   . Diabetes Brother   . Hypertension Brother   . Breast cancer Neg Hx   . Colon cancer Neg Hx   . Esophageal cancer Neg Hx   . Pancreatic cancer Neg Hx   . Liver disease Neg Hx   . Colon polyps Neg Hx    She indicated that her mother is deceased. She indicated that her father is deceased. She indicated that both of her sisters are alive. She indicated that both of her brothers are alive. She indicated that the status of her neg hx is unknown.  Social History    Social History   Socioeconomic History  . Marital status: Divorced    Spouse name: Not on file  . Number of children: 1  . Years of education: 58  . Highest education level: Not on file  Occupational History  . Occupation: Insurance account manager: [employer]     Comment: Retired  Tobacco Use  . Smoking status: Former Smoker    Packs/day: 0.50    Years: 35.00    Pack years: 17.50    Types: Cigarettes    Quit date: 07/07/2014    Years since quitting: 5.9  . Smokeless tobacco: Never Used  Vaping Use  . Vaping Use: Never used  Substance and Sexual Activity  . Alcohol use: No  . Drug use: No  . Sexual activity: Never    Partners: Male    Birth control/protection: Surgical    Comment: TAH--?ovaries remain  Other Topics Concern  . Not on file  Social History Narrative   Divorced 1 son    Retired Museum/gallery curator for [employer]   Former smoker no alcohol caffeine or drug use report denies abuse and feels safe at home.   Social Determinants of Health   Financial Resource Strain: Medium Risk  . Difficulty of Paying Living Expenses: Somewhat hard  Food Insecurity: Food Insecurity Present  . Worried About Charity fundraiser in the Last Year: Often true  . Ran Out of Food in the Last Year: Sometimes true  Transportation Needs: No Transportation Needs  . Lack of Transportation (Medical): No  . Lack of Transportation (Non-Medical): No  Physical Activity: Sufficiently Active  . Days of Exercise per Week: 5 days  . Minutes of Exercise per Session: 30 min  Stress: No Stress Concern Present  . Feeling of Stress : Not at all  Social Connections: Not on file  Intimate Partner Violence: Not At Risk  . Fear of Current or Ex-Partner: No  . Emotionally Abused: No  . Physically Abused: No  . Sexually Abused: No     Review of Systems    General:  No chills, fever, night sweats or weight changes.  Cardiovascular:  No chest pain, dyspnea on exertion, edema, orthopnea, palpitations, paroxysmal nocturnal dyspnea. Dermatological: No rash, lesions/masses Respiratory: No cough, dyspnea Urologic: No hematuria, dysuria Abdominal:   No nausea, vomiting, diarrhea, bright red blood per rectum, melena, or hematemesis Neurologic:  No visual changes,  wkns, changes in mental status. All other systems reviewed  and are otherwise negative except as noted above.  Physical Exam    VS:  BP (!) 126/52 (BP Location: Left Arm)   Pulse 75   Ht _0  (1.575 m)   Wt 126 lb 3.2 oz (57.2 kg)   SpO2 96%   BMI 23.08 kg/m  , BMI Body mass index is 23.08 kg/m. GEN: Well nourished, well developed, in no acute distress. HEENT: normal. Neck: Supple, no JVD, carotid bruits, or masses. Cardiac: RRR, no murmurs, rubs, or gallops. No clubbing, cyanosis, edema.  Radials/DP/PT 2+ and equal bilaterally.  Respiratory:  Respirations regular and unlabored, clear to auscultation bilaterally. GI: Soft, nontender, nondistended, BS + x 4. MS: no deformity or atrophy. Skin: warm and dry, no rash. Neuro:  Strength and sensation are intact. Psych: Normal affect.  Accessory Clinical Findings    Recent Labs: 09/24/2019: TSH 1.83 12/02/2019: Magnesium 2.7 06/05/2020: B Natriuretic Peptide 643.2 06/27/2020: ALT 22 06/29/2020: BUN 92; Creatinine, Ser 4.16; Hemoglobin 9.3; Platelets 230; Potassium 3.4; Sodium 138   Recent Lipid Panel    Component Value Date/Time   CHOL 155 07/11/2018 1258   TRIG 151.0 (H) 07/11/2018 1258   HDL 44.70 07/11/2018 1258   CHOLHDL 3 07/11/2018 1258   VLDL 30.2 07/11/2018 1258   LDLCALC 80 07/11/2018 1258   LDLDIRECT 79.0 07/10/2017 1359    ECG personally reviewed by me today-none today.  Echocardiogram 06/03/2020 IMPRESSIONS    1. Left ventricular ejection fraction, by estimation, is 70 to 75%. The  left ventricle has hyperdynamic function. The left ventricle has no  regional wall motion abnormalities. There is severe left ventricular  hypertrophy. Left ventricular diastolic  parameters are consistent with Grade II diastolic dysfunction  (pseudonormalization). Elevated left atrial pressure.  2. Right ventricular systolic function is normal. The right ventricular  size is normal. Tricuspid regurgitation signal is inadequate for  assessing  PA pressure.  3. Left atrial size was severely dilated.  4. The mitral valve is normal in structure. Trivial mitral valve  regurgitation.  5. The aortic valve is tricuspid. Aortic valve regurgitation is trivial.  Mild aortic valve stenosis. Vmax 2.5 m/s, MG 15 mmHg, AVA 1.5 cm^2, DI  0.65  6. 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 Recommend PYP scan to evaluate for TTR amyloid and  SPEP/UPEP/light chains to evaluate for AL amyloid   Assessment & Plan   1.  Acute on chronic diastolic CHF- no increased DOE or activity tolerance.  Euvolemic today.  Weight today 126.2.  Echocardiogram 06/03/2020 showed an EF of 70-75%, severe left ventricular hypertrophy, G2 DD elevated left atrial pressures, severely dilated left atria, trivial mitral valve regurgitation, trivial aortic valve regurgitation.  Dr. Audie Box had low suspicion for cardiac amyloidosis.  We will review with Dr. Debara Pickett before moving forward with further testing. Continue metoprolol, torsemide, Imdur, minoxidil Heart healthy low-sodium diet-salty 6 given Increase physical activity as tolerated  Mild aortic stenosis- no increased DOE or activity intolerance.  Identified on 06/03/2020 echocardiogram. Continue torsemide, metoprolol Heart healthy low-sodium diet-salty 6 given Increase physical activity as tolerated Repeat echocardiogram when clinically indicated  Essential hypertension-BP today 126/52.  Well-controlled at home.  Previously underwent renal Dopplers that showed no renal artery stenosis.  Also had plasma renin restaurant ratio performed which was negative. Continue amlodipine, hydralazine, Imdur, minoxidil, metoprolol Heart healthy low-sodium diet-salty 6 given Increase physical activity as tolerated Follows with nephrology  Hyperlipidemia-on statin therapy Continue atorvastatin Heart healthy low-sodium high-fiber diet Increase  physical activity as tolerated Follows with  PCP   Disposition: Follow-up with Dr. Debara Pickett in 3 months.  Maria Ng. Nilam Quakenbush NP-C    06/30/2020, 9:55 AM [place] [address] Office [phone] Fax [phone]  Notice: This dictation was prepared with Dragon dictation along with smaller phrase technology. Any transcriptional errors that result from this process are unintentional and may not be corrected upon review.  I spent 13 minutes examining this patient, reviewing medications, and using patient centered shared decision making involving her cardiac care.  Prior to her visit I spent greater than 20 minutes reviewing her past medical history,  medications, and prior cardiac tests.

## 2020-06-29 NOTE — Progress Notes (Signed)
D/C instructions given and reviewed. Tele and IV removed, tolerated well. Awaiting primary RN to give AM meds.

## 2020-06-29 NOTE — TOC Initial Note (Signed)
Transition of Care [facility]) - Initial/Assessment Note    Patient Details  Name: [name] MRN: [id number] Date of Birth: 1950/03/29  Transition of Care [facility]) CM/SW Contact:    Maria Mayo, RN Phone Number: 06/29/2020, 10:13 AM  Clinical Narrative:                 From home alone, but her sister, [name] stays one house down from her and can assist her when needed.  She states she has a walker /cane at home.  She still drives and [name] sometimes take her to her MD apts. NCM spoke with patient, she would like to stay with [facility] for [place], [place], [place].Patient is for dc today,  NCM notified MD to put orders in for [facility] services.    Expected Discharge Plan: [place] Barriers to Discharge: No Barriers Identified   Patient Goals and CMS Choice Patient states their goals for this hospitalization and ongoing recovery are:: return home CMS Medicare.gov Compare Post Acute Care list provided to:: Patient Choice offered to / list presented to : Patient  Expected Discharge Plan and Services Expected Discharge Plan: [place] In-house Referral: NA Discharge Planning Services: CM Consult Post Acute Care Choice: Resumption of Svcs/PTA Provider,Home Health Living arrangements for the past 2 months: Single Family Home Expected Discharge Date: 06/29/20                 DME [name]: NA       HH Arranged: RN,PT,OT [name]: Maria ([facility]) Date HH [name] Contacted: 06/29/20 Time HH [name] Contacted: 52 Representative spoke with at [place]: [name] Arrangements/Services Living arrangements for the past 2 months: [place] with:: Self Patient language and need for interpreter reviewed:: Yes Do you feel safe going back to the place where you live?: Yes      Need for Family Participation in Patient Care: Yes (Comment) Care giver support system in place?: Yes (comment) Current home services: DME  (walker/cane) Criminal Activity/Legal Involvement Pertinent to Current Situation/Hospitalization: No - Comment as needed  Activities of Daily Living      Permission Sought/Granted                  Emotional Assessment   Attitude/Demeanor/Rapport: Engaged Affect (typically observed): Appropriate Orientation: : Oriented to Self,Oriented to Place,Oriented to  Time,Oriented to Situation Alcohol / Substance Use: Not Applicable Psych Involvement: No (comment)  Admission diagnosis:  Symptomatic anemia [D64.9] Patient Active Problem List   Diagnosis Date Noted  . Symptomatic anemia 06/27/2020  . Acute on chronic diastolic CHF (congestive heart failure) ([place]) 06/04/2020  . Shortness of breath 06/03/2020  . GERD without esophagitis 06/03/2020  . Elevated troponin level not due myocardial infarction 06/03/2020  . Heme positive stool   . GI bleed 05/18/2020  . CKD (chronic kidney disease), stage IV ([place]) 05/18/2020  . Knee pain 05/18/2020  . Right flank discomfort 11/28/2019  . Vitamin D toxicity, accidental or unintentional, initial encounter 09/09/2017  . Ataxia 09/09/2017  . Type 2 diabetes mellitus with stage 4 chronic kidney disease, without long-term current use of insulin ([place]) 09/09/2017  . Chronic kidney disease (CKD)   . Olecranon bursitis of right elbow 03/09/2017  . Thyroid nodule 06/29/2016  . Type 2 diabetes mellitus with proliferative retinopathy without macular edema, unspecified laterality, unspecified whether long term insulin use ([place]) 06/13/2016  . Vitamin D deficiency 01/12/2016  . Medicare annual wellness visit,  initial 01/11/2016  . Lacunar infarct, acute ([place]) 03/23/2015  . Non compliance w medication regimen 01/16/2015  . Anemia 01/08/2015  . Stroke ([place]) 01/08/2015  . Mixed diabetic hyperlipidemia associated with type 2 diabetes mellitus ([place])   . Carotid bruit present 12/13/2012  . Routine health maintenance 10/24/2010  . PERIPHERAL EDEMA 09/10/2009   . ELECTROCARDIOGRAM, ABNORMAL 04/11/2008  . HEART MURMUR, SYSTOLIC 47/65/4650  . SEBACEOUS CYST, NECK 12/13/2006  . Hyperthyroidism 09/27/2006  . Secondary diabetes with peripheral vascular disease ([place]) 09/27/2006  . Hyperlipidemia LDL goal <70 09/27/2006  . TOBACCO ABUSE 09/27/2006  . Hypertensive urgency 09/27/2006   PCP:  Maria Pian, PA-C Pharmacy:   [id number] [place], [place] [address] [place] [address] Phone: [phone] Fax: [phone]  [facility]  ([place], [address], [address] [place], [place] [address] Phone: [phone] Fax: [place] [address] [place] [address] Phone: [phone] Fax: [phone]     Social Determinants of Health (SDOH) Interventions    Readmission Risk Interventions Readmission Risk Prevention Plan 06/29/2020 06/08/2020 05/20/2020  Transportation Screening Complete Complete Complete  PCP or Specialist Appt within 3-5 Days Complete Complete -  HRI or Home Care Consult Complete Complete Complete  Social Work Consult for [place] Planning/Counseling Complete Complete Complete  Palliative Care Screening Not Applicable Not Applicable Not Applicable  Medication Review Press photographer) Complete Complete -  Some recent data might be hidden

## 2020-06-29 NOTE — Discharge Summary (Signed)
Physician Discharge Summary  LORITA FORINASH XFG:[id number] DOB: [birth year] DOA: 06/26/2020  PCP: Erby Pian, PA-C  Admit date: 06/26/2020 Discharge date: 06/29/2020  Admitted From: Home Disposition:  Home  Recommendations for Outpatient Follow-up:  1. Follow up with PCP in 1 week 2. Follow up with GI, Dr. Henrene Pastor, for results of capsule endoscopy.  They will be in touch with results as an outpatient 3. Follow-up with Dr. Rolm [facility] for virtual colonoscopy as previously recommended 4. Outpatient referral for hematology for chronic recurrent anemia placed 5. Also has outpatient appointment with cardiology on 6/7, nephrology on 6/22 6. Continue to monitor CBC and BMP as outpatient for her chronic recurrent anemia as well as CKD 7. Stop asprin and NSAID, discussed with patient   Discharge Condition: Stable CODE STATUS: Full  Diet recommendation:  Diet Orders (From admission, onward)    Start     Ordered   06/29/20 0000  Diet - low sodium heart healthy        06/29/20 0932   06/28/20 1432  Diet Heart Room service appropriate? Yes; Fluid consistency: Thin  Diet effective now       Question Answer Comment  Room service appropriate? Yes   Fluid consistency: Thin      06/28/20 1431         Brief/Interim Summary: Marlyne Beards a70 y.o.femalewith medical history significant ofrecurrent anemia secondary to suspected GI bleed and chronic kidney disease, chronic kidney disease stage IV, hypertension, GERD, hyperlipidemia, non-insulin-dependent diabetes, who was recently hospitalized twice first April 25 to 29 and then May 11 to 18 both with a diagnosis of symptomatic anemia possibly GI bleed, then again with CHF. She had undergone EGD and colonoscopy recently, no source of bleed found.   Patient now returns with SOB, weakness, and hemoglobin of 5.2 (was8.9 when she left the hospital on May 18). She denied any melena. No bright red blood per rectum.She has been taking  aspirin 384m daily and took 1 aleve 2 days ago for arthritis.  She was given 2 unit packed red blood cell transfusion, GI consulted.  Patient underwent capsule endoscopy, results pending at the time of discharge.  Patient's aspirin and NSAID were held.  Her hemoglobin remained stable, no overt bleeding noted during hospital stay.  Patient remained stable for discharge home with close follow-up as outpatient  Discharge Diagnoses:  Principal Problem:   Symptomatic anemia Active Problems:   Hyperthyroidism   Hyperlipidemia LDL goal <70   TOBACCO ABUSE   Type 2 diabetes mellitus with stage 4 chronic kidney disease, without long-term current use of insulin (HCC)   GI bleed   CKD (chronic kidney disease), stage IV (HCC)   GERD without esophagitis   Acute on chronic diastolic CHF (congestive heart failure) (HCC)   Symptomatic anemia,in setting of chronic kidney disease, also with concern for GI bleeding -Recent EGD in 04/2020, colonoscopy 05/2020 without source of bleeding found -FOBT positive -Status post 2 unit packed red blood cell -GI following, s/p capsule endoscopy - results will be relayed to patient after discharge  -Hold aspirin, NSAID -PPI -Hemoglobin stable this morning  Essential hypertension -Continue amlodipine, metoprolol, hydralazine, Imdur, minoxidil  CKD stage IV -[facility] -Creatinine stable from recent admission  Chronic diastolic heart failure -Without signs of fluid overload -Continue torsemide  Hyperlipidemia -Continue Lipitor  Diabetes mellitus -Continue sliding scale insulin  Hypokalemia -Replaced    Discharge Instructions  Discharge Instructions    (HEART FAILURE  PATIENTS) Call MD:  Anytime you have any of the following symptoms: 1) 3 pound weight gain in 24 hours or 5 pounds in 1 week 2) shortness of breath, with or without a dry hacking cough 3) swelling in the hands, feet or stomach 4) if you  have to sleep on extra pillows at night in order to breathe.   Complete by: As directed    Ambulatory referral to Hematology / Oncology   Complete by: As directed    Call MD for:  difficulty breathing, headache or visual disturbances   Complete by: As directed    Call MD for:  extreme fatigue   Complete by: As directed    Call MD for:  persistant dizziness or light-headedness   Complete by: As directed    Call MD for:  persistant nausea and vomiting   Complete by: As directed    Call MD for:  severe uncontrolled pain   Complete by: As directed    Call MD for:  temperature >100.4   Complete by: As directed    Diet - low sodium heart healthy   Complete by: As directed    Discharge instructions   Complete by: As directed    You were cared for by a hospitalist during your hospital stay. If you have any questions about your discharge medications or the care you received while you were in the hospital after you are discharged, you can call the unit and ask to speak with the hospitalist on call if the hospitalist that took care of you is not available. Once you are discharged, your primary care physician will handle any further medical issues. Please note that NO REFILLS for any discharge medications will be authorized once you are discharged, as it is imperative that you return to your primary care physician (or establish a relationship with a primary care physician if you do not have one) for your aftercare needs so that they can reassess your need for medications and monitor your lab values.   Increase activity slowly   Complete by: As directed      Allergies as of 06/29/2020      Reactions   Semaglutide Rash   Rybelsus 3 mg      Medication List    STOP taking these medications   aspirin 325 MG tablet   naproxen sodium 220 MG tablet Commonly known as: ALEVE     TAKE these medications   acarbose 25 MG tablet Commonly known as: PRECOSE Take 0.5 tablets (12.5 mg total) by mouth 3  (three) times daily with meals.   acetaminophen 500 MG tablet Commonly known as: TYLENOL Take 1,000 mg by mouth every 6 (six) hours as needed for moderate pain.   allopurinol 100 MG tablet Commonly known as: ZYLOPRIM Take 1 tablet (100 mg total) by mouth daily.   amLODipine 10 MG tablet Commonly known as: NORVASC TAKE 1 TABLET BY MOUTH EVERY DAY   atorvastatin 80 MG tablet Commonly known as: LIPITOR TAKE 1 TABLET BY MOUTH EVERY DAY What changed:   how much to take  how to take this  when to take this  additional instructions   bromocriptine 2.5 MG tablet Commonly known as: PARLODEL Take 0.5 tablets (1.25 mg total) by mouth daily.   epoetin alfa-epbx 40000 UNIT/ML injection Commonly known as: RETACRIT Inject 40,000 Units into the skin every 28 (twenty-eight) days.   hydrALAZINE 50 MG tablet Commonly known as: APRESOLINE Take 1 tablet (50 mg total) by mouth every  8 (eight) hours.   isosorbide mononitrate 30 MG 24 hr tablet Commonly known as: IMDUR Take 1 tablet (30 mg total) by mouth daily.   metoprolol succinate 100 MG 24 hr tablet Commonly known as: TOPROL-XL Take 100 mg by mouth daily.   minoxidil 10 MG tablet Commonly known as: LONITEN Take 10 mg by mouth 2 (two) times daily.   OneTouch Delica Lancets 03J Misc 1 each by Does not apply route 2 (two) times daily. E11.9   OneTouch Verio Flex System w/Device Kit 1 each by Does not apply route 2 (two) times daily. E11.9   OneTouch Verio test strip Generic drug: glucose blood USE AS DIRECTED TWICE DAILY   pantoprazole 40 MG tablet Commonly known as: PROTONIX Take 1 tablet (40 mg total) by mouth daily.   polyethylene glycol 17 g packet Commonly known as: MIRALAX / GLYCOLAX Take 34-51 g by mouth daily as needed for mild constipation.   repaglinide 2 MG tablet Commonly known as: PRANDIN Take 1 tablet (2 mg total) by mouth 2 (two) times daily before a meal.   torsemide 20 MG tablet Commonly known as:  DEMADEX Take 4 tablets (80 mg total) by mouth 2 (two) times daily.       Follow-up Information    Erby Pian, PA-C. Schedule an appointment as soon as possible for a visit in 1 week(s).   Specialty: Physician Assistant Contact information: [address] [place] [address] [phone]        Irene Shipper, MD Follow up.   Specialty: Gastroenterology Why: His office will call you with results of capsule endoscopy study done 6/5. Contact information: [address] [address] [phone]        Margaretmary Bayley, MD Follow up.   Specialty: Internal Medicine Why: Follow up with GI  Contact information: [address] [place] [address] [phone]        [place] Follow up.   Why: Outpatient referral placed for [facility] for recurrent chronic symptomatic anemia of multifactorial etiology             Allergies  Allergen Reactions  . Semaglutide Rash    Rybelsus 3 mg    Consultations:  GI    Procedures/Studies: DG Chest 2 View  Result Date: 06/05/2020 CLINICAL DATA:  Shortness of breath. EXAM: CHEST - 2 VIEW COMPARISON:  Chest x-rays dated 06/02/2020 and and 09/24/2019. FINDINGS: Prominent interstitial markings and/or ground-glass opacities are again seen throughout the lower lobes bilaterally, stable to slightly increased compared to most recent chest x-ray of 06/02/2020, new compared to the earlier study of 09/24/2019. Stable cardiomegaly. No pleural effusion or pneumothorax is seen. Osseous structures about the chest are unremarkable. IMPRESSION: Probable bibasilar pneumonia. COVID pneumonia can have this appearance. Alternatively, if afebrile, this could represent CHF with asymmetric pulmonary edema pattern. Electronically Signed   By: Franki Cabot M.D.   On: 06/05/2020 11:54   US RENAL  Result Date: 06/06/2020 CLINICAL DATA:  Acute on chronic kidney disease  EXAM: RENAL / URINARY TRACT ULTRASOUND COMPLETE COMPARISON:  CT abdomen pelvis 05/08/2020 FINDINGS: Right Kidney: Renal measurements: 10.4 x 4.6 x 5.5 cm = volume: 138 mL. Echogenicity is increased. No mass or hydronephrosis visualized. Left Kidney: Renal measurements: 9.5 x 5.7 x 4.5 cm = volume: 153 mL. Echogenicity is increased. No hydronephrosis. There is a subcentimeter cyst in the lateral aspect of the kidney. Bladder: Appears normal for degree of  bladder distention. Other: Incidentally noted small right pleural effusion. IMPRESSION: 1.  No acute finding sonographically in the bilateral kidneys. 2. Increased echogenicity of the bilateral kidneys as can be seen in medical renal disease. Subcentimeter cyst in the left kidney. Electronically Signed   By: Audie Pinto M.D.   On: 06/06/2020 16:35   DG Chest Portable 1 View  Result Date: 06/26/2020 CLINICAL DATA:  Shortness of breath and anemia EXAM: PORTABLE CHEST 1 VIEW COMPARISON:  Jun 05, 2020 FINDINGS: There is slight left lower lobe atelectasis. Lungs elsewhere clear. Heart is borderline prominent with pulmonary vascularity normal. No adenopathy. There is aortic sclerosis. No bone lesions. IMPRESSION: Stable borderline cardiac prominence. Mild left lower lobe atelectasis. Lungs otherwise clear. Aortic Atherosclerosis (ICD10-I70.0). Electronically Signed   By: Lowella Grip III M.D.   On: 06/26/2020 17:22   DG Chest Portable 1 View  Result Date: 06/02/2020 CLINICAL DATA:  Shortness of breath for 2-3 days EXAM: PORTABLE CHEST 1 VIEW COMPARISON:  09/24/2019 FINDINGS: Cardiac shadow is enlarged but stable. Patchy airspace opacity is noted in the left lung base consistent with acute infiltrate. No sizable effusion is noted. No bony abnormality is seen. IMPRESSION: Patchy airspace opacity in the left base consistent with acute pneumonia. Electronically Signed   By: Inez Catalina M.D.   On: 06/02/2020 23:36   DG Abdomen Acute W/Chest  Result Date:  06/03/2020 CLINICAL DATA:  70 year old female with constipation. EXAM: DG ABDOMEN ACUTE WITH 1 VIEW CHEST COMPARISON:  CT abdomen pelvis dated 05/08/2020. FINDINGS: There is mild diffuse interstitial coarsening. Left lung base linear atelectasis/scarring. No focal consolidation, pleural effusion, or pneumothorax. Mild cardiomegaly. Atherosclerotic calcification of the aorta. There is no bowel dilatation or evidence of obstruction. No significant colonic stool burden. No free air or radiopaque calculi. Osteopenia with degenerative changes of the spine. No acute osseous pathology. IMPRESSION: 1. No acute cardiopulmonary process. 2. No evidence of bowel obstruction. Electronically Signed   By: Anner Crete M.D.   On: 06/03/2020 01:41   ECHOCARDIOGRAM COMPLETE  Result Date: 06/03/2020    ECHOCARDIOGRAM REPORT   Patient Name:   VIOLANDA BOBECK Date of Exam: 06/03/2020 Medical Rec #:  [id number]         Height:       62.0 in Accession #:    [id number]        Weight:       139.0 lb Date of Birth:  [birth year]          BSA:          1.638 m Patient Age:    70 years          BP:           167/58 mmHg Patient Gender: F                 HR:           69 bpm. Exam Location:  Inpatient Procedure: 2D Echo, Cardiac Doppler and Color Doppler Indications:    CHF-Acute Systolic H85.27  History:        Patient has prior history of Echocardiogram examinations, most                 recent 01/09/2015.  Sonographer:    Merrie Roof Referring Phys: [id number] [place]  1. Left ventricular ejection fraction, by estimation, is 70 to 75%. The left ventricle has hyperdynamic function. The left ventricle has no regional wall motion abnormalities. There is severe left  ventricular hypertrophy. Left ventricular diastolic parameters are consistent with Grade II diastolic dysfunction (pseudonormalization). Elevated left atrial pressure.  2. Right ventricular systolic function is normal. The right ventricular size is normal.  Tricuspid regurgitation signal is inadequate for assessing PA pressure.  3. Left atrial size was severely dilated.  4. The mitral valve is normal in structure. Trivial mitral valve regurgitation.  5. The aortic valve is tricuspid. Aortic valve regurgitation is trivial. Mild aortic valve stenosis. Vmax 2.5 m/s, MG 15 mmHg, AVA 1.5 cm^2, DI 0.65  6. 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 Recommend PYP scan to evaluate for TTR amyloid and SPEP/UPEP/light chains to evaluate for AL amyloid FINDINGS  Left Ventricle: Left ventricular ejection fraction, by estimation, is 70 to 75%. The left ventricle has hyperdynamic function. The left ventricle has no regional wall motion abnormalities. The left ventricular internal cavity size was normal in size. There is severe left ventricular hypertrophy. Left ventricular diastolic parameters are consistent with Grade II diastolic dysfunction (pseudonormalization). Elevated left atrial pressure. Right Ventricle: The right ventricular size is normal. No increase in right ventricular wall thickness. Right ventricular systolic function is normal. Tricuspid regurgitation signal is inadequate for assessing PA pressure. Left Atrium: Left atrial size was severely dilated. Right Atrium: Right atrial size was normal in size. Pericardium: Trivial pericardial effusion is present. Mitral Valve: The mitral valve is normal in structure. Trivial mitral valve regurgitation. Tricuspid Valve: The tricuspid valve is normal in structure. Tricuspid valve regurgitation is trivial. Aortic Valve: The aortic valve is tricuspid. Aortic valve regurgitation is trivial. Mild aortic stenosis is present. Aortic valve mean gradient measures 13.5 mmHg. Aortic valve peak gradient measures 24.6 mmHg. Aortic valve area, by VTI measures 1.57 cm. Pulmonic Valve: The pulmonic valve was not well visualized. Pulmonic valve regurgitation is not visualized. Aorta: The aortic root and  ascending aorta are structurally normal, with no evidence of dilitation. IAS/Shunts: The interatrial septum was not well visualized.  LEFT VENTRICLE PLAX 2D LVIDd:         3.70 cm  Diastology LVIDs:         2.20 cm  LV e' medial:    6.42 cm/s LV PW:         1.80 cm  LV E/e' medial:  19.9 LV IVS:        1.60 cm  LV e' lateral:   7.62 cm/s LVOT diam:     1.70 cm  LV E/e' lateral: 16.8 LV SV:         93 LV SV Index:   57 LVOT Area:     2.27 cm  IVC IVC diam: 2.00 cm LEFT ATRIUM             Index LA diam:        5.10 cm 3.11 cm/m LA Vol (A2C):   68.1 ml 41.58 ml/m LA Vol (A4C):   79.3 ml 48.42 ml/m LA Biplane Vol: 81.7 ml 49.88 ml/m  AORTIC VALVE AV Area (Vmax):    1.60 cm AV Area (Vmean):   1.72 cm AV Area (VTI):     1.57 cm AV Vmax:           248.00 cm/s AV Vmean:          173.000 cm/s AV VTI:            0.594 m AV Peak Grad:      24.6 mmHg AV Mean Grad:      13.5  mmHg LVOT Vmax:         175.00 cm/s LVOT Vmean:        131.000 cm/s LVOT VTI:          0.410 m LVOT/AV VTI ratio: 0.69  AORTA Ao Root diam: 3.20 cm Ao Asc diam:  3.30 cm MITRAL VALVE MV Area (PHT): 2.62 cm     SHUNTS MV Decel Time: 289 msec     Systemic VTI:  0.41 m MV E velocity: 128.00 cm/s  Systemic Diam: 1.70 cm MV A velocity: 157.00 cm/s MV E/A ratio:  0.82 Oswaldo Milian MD Electronically signed by Oswaldo Milian MD Signature Date/Time: 06/03/2020/5:20:13 PM    Final        Discharge Exam: Vitals:   06/29/20 0003 06/29/20 0402  BP: (!) 132/40 (!) 140/40  Pulse: 75 80  Resp: 19 18  Temp: 98.4 F (36.9 C) 99.8 F (37.7 C)  SpO2: 97% 96%    General: Pt is alert, awake, not in acute distress Cardiovascular: RRR, S1/S2 +, no edema Respiratory: CTA bilaterally, no wheezing, no rhonchi, no respiratory distress, no conversational dyspnea  Abdominal: Soft, NT, ND, bowel sounds + Extremities: no edema, no cyanosis Psych: Normal mood and affect, stable judgement and insight     The results of significant diagnostics  from this hospitalization (including imaging, microbiology, ancillary and laboratory) are listed below for reference.     Microbiology: Recent Results (from the past 240 hour(s))  SARS CORONAVIRUS 2 (TAT 6-24 HRS) Nasopharyngeal Nasopharyngeal Swab     Status: None   Collection Time: 06/26/20 11:34 PM   Specimen: Nasopharyngeal Swab  Result Value Ref Range Status   SARS Coronavirus 2 NEGATIVE NEGATIVE Final    Comment: (NOTE) SARS-CoV-2 target nucleic acids are NOT DETECTED.  The SARS-CoV-2 RNA is generally detectable in upper and lower respiratory specimens during the acute phase of infection. Negative results do not preclude SARS-CoV-2 infection, do not rule out co-infections with other pathogens, and should not be used as the sole basis for treatment or other patient management decisions. Negative results must be combined with clinical observations, patient history, and epidemiological information. The expected result is Negative.  Fact Sheet for Patients: SugarRoll.be  Fact Sheet for Healthcare Providers: https://www.woods-mathews.com/  This test is not yet approved or cleared by the [place] FDA and  has been authorized for detection and/or diagnosis of SARS-CoV-2 by FDA under an Emergency Use Authorization (EUA). This EUA will remain  in effect (meaning this test can be used) for the duration of the COVID-19 declaration under Se ction 564(b)(1) of the Act, 21 U.S.C. section 360bbb-3(b)(1), unless the authorization is terminated or revoked sooner.  Performed at [facility], [place] [address], [place], [place] [address]      Labs: BNP (last 3 results) Recent Labs    06/03/20 0044 06/05/20 1217  BNP 669.8* 308.6*   Basic Metabolic Panel: Recent Labs  Lab 06/26/20 1435 06/26/20 1655 06/27/20 1413 06/28/20 0805 06/29/20 0730  NA 135 136 137 137 138  K 3.8 3.9 3.7 3.7 3.4*  CL 98 102 101 100 101  CO2 _0 GLUCOSE 288* 160* 77 89 107*  BUN 98* 99* 101* 93* 92*  CREATININE 4.49* 4.32* 4.36* 4.15* 4.16*  CALCIUM 8.1*  8.0* 8.2* 8.3* 8.8* 9.0  PHOS 5.5*  --   --   --   --    Liver Function Tests: Recent Labs  Lab 06/26/20 1435 06/27/20 1413  AST  --  16  ALT  --  22  ALKPHOS  --  57  BILITOT  --  2.0*  PROT  --  6.4*  ALBUMIN 2.9* 2.9*   No results for input(s): LIPASE, AMYLASE in the last 168 hours. No results for input(s): AMMONIA in the last 168 hours. CBC: Recent Labs  Lab 06/26/20 1655 06/27/20 0204 06/27/20 1413 06/27/20 1641 06/27/20 2012 06/28/20 0805 06/29/20 0730  WBC 5.4   < > 6.0 6.1 6.9 7.5 6.5  NEUTROABS 3.7  --   --   --   --   --   --   HGB 5.7*   < > 8.8* 8.7* 9.8* 9.4* 9.3*  HCT 18.3*   < > 25.9* 25.4* 29.4* 27.7* 28.0*  MCV 99.5   < > 92.5 92.0 92.5 91.7 92.1  PLT 196   < > 193 190 233 220 230   < > = values in this interval not displayed.   Cardiac Enzymes: No results for input(s): CKTOTAL, CKMB, CKMBINDEX, TROPONINI in the last 168 hours. BNP: Invalid input(s): POCBNP CBG: Recent Labs  Lab 06/28/20 0619 06/28/20 1211 06/28/20 1656 06/28/20 2110 06/29/20 0622  GLUCAP 78 117* 213* 161* 118*   D-Dimer No results for input(s): DDIMER in the last 72 hours. Hgb A1c No results for input(s): HGBA1C in the last 72 hours. Lipid Profile No results for input(s): CHOL, HDL, LDLCALC, TRIG, CHOLHDL, LDLDIRECT in the last 72 hours. Thyroid function studies No results for input(s): TSH, T4TOTAL, T3FREE, THYROIDAB in the last 72 hours.  Invalid input(s): FREET3 Anemia work up Recent Labs    06/26/20 1435  FERRITIN 392*  TIBC 217*  IRON 21*   Urinalysis    Component Value Date/Time   COLORURINE YELLOW 06/07/2020 0041   APPEARANCEUR CLEAR 06/07/2020 0041   LABSPEC 1.008 06/07/2020 0041   PHURINE 5.0 06/07/2020 0041   GLUCOSEU NEGATIVE 06/07/2020 0041   GLUCOSEU NEGATIVE 09/04/2009 0721   HGBUR NEGATIVE 06/07/2020 0041   BILIRUBINUR  NEGATIVE 06/07/2020 0041   BILIRUBINUR negative 09/24/2019 1003   KETONESUR NEGATIVE 06/07/2020 0041   PROTEINUR 100 (A) 06/07/2020 0041   UROBILINOGEN 0.2 09/24/2019 1003   UROBILINOGEN 0.2 09/04/2009 0721   NITRITE NEGATIVE 06/07/2020 0041   LEUKOCYTESUR NEGATIVE 06/07/2020 0041   Sepsis Labs Invalid input(s): PROCALCITONIN,  WBC,  LACTICIDVEN Microbiology Recent Results (from the past 240 hour(s))  SARS CORONAVIRUS 2 (TAT 6-24 HRS) Nasopharyngeal Nasopharyngeal Swab     Status: None   Collection Time: 06/26/20 11:34 PM   Specimen: Nasopharyngeal Swab  Result Value Ref Range Status   SARS Coronavirus 2 NEGATIVE NEGATIVE Final    Comment: (NOTE) SARS-CoV-2 target nucleic acids are NOT DETECTED.  The SARS-CoV-2 RNA is generally detectable in upper and lower respiratory specimens during the acute phase of infection. Negative results do not preclude SARS-CoV-2 infection, do not rule out co-infections with other pathogens, and should not be used as the sole basis for treatment or other patient management decisions. Negative results must be combined with clinical observations, patient history, and epidemiological information. The expected result is Negative.  Fact Sheet for Patients: SugarRoll.be  Fact Sheet for Healthcare Providers: https://www.woods-mathews.com/  This test is not yet approved or cleared by the [place] FDA and  has been authorized for detection and/or diagnosis of SARS-CoV-2 by FDA under an Emergency Use Authorization (EUA). This EUA will remain  in effect (meaning this test can be used) for the duration of the COVID-19 declaration under  Se ction 564(b)(1) of the Act, 21 U.S.C. section 360bbb-3(b)(1), unless the authorization is terminated or revoked sooner.  Performed at [facility], [place] [address], [place], [place] [address]      Patient was seen and examined on the day of discharge and was found to  be in stable condition. Time coordinating discharge: 35 minutes including assessment and coordination of care, as well as examination of the patient.   SIGNED:  Dessa Phi, DO [facility] 06/29/2020, 9:36 AM

## 2020-06-29 NOTE — Evaluation (Signed)
Physical Therapy Evaluation Patient Details Name: [name] MRN: [id number] DOB: March 05, 1950 Today's Date: 06/29/2020   History of Present Illness  [name] is a 70 y.o. female who was recently hospitalized twice first April 25 to 30 and then May 11 to 18 both with a diagnosis of symptomatic anemia possibly GI bleed, then again with CHF. She had undergone EGD and colonoscopy recently, no source of bleed found with medical history significant of recurrent anemia secondary to suspected GI bleed and chronic kidney disease, chronic kidney disease stage IV, hypertension, GERD, hyperlipidemia, non-insulin-dependent diabetes  Clinical Impression   Patient evaluated by Physical Therapy with no further acute PT needs identified, and plan is for dc home today; All education has been completed and the patient has no further questions. Noted TOC has set up for [facility] services; Pt did report she is wondering what is the result of her test yesterday; See below for any follow-up Physical Therapy or equipment needs. PT is signing off. Thank you for this referral.     Follow Up Recommendations Home health PT;Supervision - Intermittent  Consider Outpt PT after HH course if her L knee continue to bother her.    Equipment Recommendations  Other (comment) (Has RW and cane)    Recommendations for Other Services       Precautions / Restrictions Precautions Precautions: None      Mobility  Bed Mobility Overal bed mobility: Independent                  Transfers Overall transfer level: Modified independent Equipment used: Rolling walker (2 wheeled)             General transfer comment: Good rise, no physical assist needed; notable incr use of UEs for support  Ambulation/Gait Ambulation/Gait assistance: Supervision Gait Distance (Feet): 120 Feet Assistive device: Rolling walker (2 wheeled) Gait Pattern/deviations: Step-through pattern Gait velocity: slow (but ptindicated she  typically walks slowly)   General Gait Details: Slow pace, but overall steady using RW; cues to self-monitor for actiivty tolerance; pt indicated RW was useful for walking with her sore L knee  Stairs         General stair comments: Tells me that her son or sister can help her up the steps into her home  Wheelchair Mobility    Modified Rankin (Stroke Patients Only)       Balance Overall balance assessment: Mild deficits observed, not formally tested                                           Pertinent Vitals/Pain Pain Assessment: Faces Faces Pain Scale: Hurts a little bit Pain Location: L knee Pain Descriptors / Indicators: Aching Pain Intervention(s): Monitored during session;Other (comment) (and described how to use RW to take pressure off of sore knee if needed)    Home Living Family/patient expects to be discharged to:: Private residence Living Arrangements: Alone Available Help at Discharge: Family;Available PRN/intermittently (Sister lives next door, son is local) Type of Home: House Home Access: Stairs to enter Entrance Stairs-Rails: None Entrance Stairs-Number of Steps: 3 Home Layout: One level Home Equipment: Cane - single point;Walker - 2 wheels;Grab bars - tub/shower      Prior Function Level of Independence: Independent         Comments: occasionally uses SPC; used RW here just before admission for steadiness  Hand Dominance   Dominant Hand: Right    Extremity/Trunk Assessment   Upper Extremity Assessment Upper Extremity Assessment: Overall WFL for tasks assessed    Lower Extremity Assessment Lower Extremity Assessment: Generalized weakness;LLE deficits/detail LLE Deficits / Details: Reports L knee soreness; seems more painful with wiehgt bearing; RW proved to be useful for stabiltiy with amb with sore knee       Communication   Communication: No difficulties  Cognition Arousal/Alertness: Awake/alert Behavior During  Therapy: WFL for tasks assessed/performed;Flat affect Overall Cognitive Status: Within Functional Limits for tasks assessed                                        General Comments General comments (skin integrity, edema, etc.):   06/29/20 1010  Vital Signs  Patient Position (if appropriate) Orthostatic Vitals  Orthostatic Sitting  BP- Sitting 130/75 (MAP 90)  Pulse- Sitting 77  Orthostatic Standing at 0 minutes  BP- Standing at 0 minutes 119/57 (MAP 75)  Pulse- Standing at 0 minutes 79  Orthostatic Standing at 3 minutes  BP- Standing at 3 minutes 155/56 (MAP 81)  Pulse- Standing at 3 minutes 80 (After hallway walking)   No lightheadeness reported    Exercises     Assessment/Plan    PT Assessment All further PT needs can be met in the next venue of care  PT Problem List Decreased activity tolerance;Pain       PT Treatment Interventions      PT Goals (Current goals can be found in the Care Plan section)  Acute Rehab PT Goals Patient Stated Goal: Would like to know the results of her tests PT Goal Formulation: All assessment and education complete, DC therapy    Frequency     Barriers to discharge        Co-evaluation               AM-PAC PT "6 Clicks" Mobility  Outcome Measure Help needed turning from your back to your side while in a flat bed without using bedrails?: None Help needed moving from lying on your back to sitting on the side of a flat bed without using bedrails?: None Help needed moving to and from a bed to a chair (including a wheelchair)?: None Help needed standing up from a chair using your arms (e.g., wheelchair or bedside chair)?: None Help needed to walk in hospital room?: None Help needed climbing 3-5 steps with a railing? : A Little 6 Click Score: 23    End of Session   Activity Tolerance: Patient tolerated treatment well Patient left: in chair;with call bell/phone within reach   PT Visit Diagnosis: Other  abnormalities of gait and mobility (R26.89)    Time: 1941-7408 PT Time Calculation (min) (ACUTE ONLY): 21 min   Charges:   PT Evaluation $PT Eval Low Complexity: 1 Low          Roney Marion, PT  Acute Rehabilitation Services Pager [phone] Office [place] 06/29/2020, 10:34 AM

## 2020-06-30 ENCOUNTER — Other Ambulatory Visit: Payer: Self-pay

## 2020-06-30 ENCOUNTER — Encounter: Payer: Self-pay | Admitting: General Practice

## 2020-06-30 ENCOUNTER — Emergency Department (HOSPITAL_COMMUNITY)
Admission: EM | Admit: 2020-06-30 | Discharge: 2020-06-30 | Disposition: A | Discharge status: Home / Self Care | Payer: Medicare Other | Attending: Emergency Medicine | Admitting: Emergency Medicine

## 2020-06-30 ENCOUNTER — Emergency Department (HOSPITAL_COMMUNITY): Payer: Medicare Other

## 2020-06-30 ENCOUNTER — Ambulatory Visit (INDEPENDENT_AMBULATORY_CARE_PROVIDER_SITE_OTHER): Payer: Medicare Other | Admitting: General Practice

## 2020-06-30 VITALS — BP 126/52 | HR 75 | Ht 62.0 in | Wt 126.2 lb

## 2020-06-30 DIAGNOSIS — D649 Anemia, unspecified: Secondary | ICD-10-CM | POA: Diagnosis not present

## 2020-06-30 DIAGNOSIS — E11319 Type 2 diabetes mellitus with unspecified diabetic retinopathy without macular edema: Secondary | ICD-10-CM | POA: Diagnosis not present

## 2020-06-30 DIAGNOSIS — I10 Essential (primary) hypertension: Secondary | ICD-10-CM

## 2020-06-30 DIAGNOSIS — Z79899 Other long term (current) drug therapy: Secondary | ICD-10-CM | POA: Diagnosis not present

## 2020-06-30 DIAGNOSIS — I130 Hypertensive heart and chronic kidney disease with heart failure and stage 1 through stage 4 chronic kidney disease, or unspecified chronic kidney disease: Secondary | ICD-10-CM | POA: Diagnosis not present

## 2020-06-30 DIAGNOSIS — D631 Anemia in chronic kidney disease: Secondary | ICD-10-CM | POA: Insufficient documentation

## 2020-06-30 DIAGNOSIS — I5033 Acute on chronic diastolic (congestive) heart failure: Secondary | ICD-10-CM

## 2020-06-30 DIAGNOSIS — I517 Cardiomegaly: Secondary | ICD-10-CM | POA: Diagnosis not present

## 2020-06-30 DIAGNOSIS — Z87891 Personal history of nicotine dependence: Secondary | ICD-10-CM | POA: Diagnosis not present

## 2020-06-30 DIAGNOSIS — N184 Chronic kidney disease, stage 4 (severe): Secondary | ICD-10-CM | POA: Insufficient documentation

## 2020-06-30 DIAGNOSIS — R0602 Shortness of breath: Secondary | ICD-10-CM | POA: Insufficient documentation

## 2020-06-30 DIAGNOSIS — R6889 Other general symptoms and signs: Secondary | ICD-10-CM | POA: Diagnosis not present

## 2020-06-30 DIAGNOSIS — I350 Nonrheumatic aortic (valve) stenosis: Secondary | ICD-10-CM

## 2020-06-30 DIAGNOSIS — Z7984 Long term (current) use of oral hypoglycemic drugs: Secondary | ICD-10-CM | POA: Diagnosis not present

## 2020-06-30 DIAGNOSIS — E1122 Type 2 diabetes mellitus with diabetic chronic kidney disease: Secondary | ICD-10-CM | POA: Diagnosis not present

## 2020-06-30 DIAGNOSIS — E785 Hyperlipidemia, unspecified: Secondary | ICD-10-CM | POA: Diagnosis not present

## 2020-06-30 DIAGNOSIS — Z743 Need for continuous supervision: Secondary | ICD-10-CM | POA: Diagnosis not present

## 2020-06-30 DIAGNOSIS — R58 Hemorrhage, not elsewhere classified: Secondary | ICD-10-CM | POA: Diagnosis not present

## 2020-06-30 LAB — URINALYSIS, ROUTINE W REFLEX MICROSCOPIC
Bilirubin Urine: NEGATIVE
Glucose, UA: NEGATIVE mg/dL
Hgb urine dipstick: NEGATIVE
Ketones, ur: NEGATIVE mg/dL
Leukocytes,Ua: NEGATIVE
Nitrite: NEGATIVE
Protein, ur: 30 mg/dL — AB
Specific Gravity, Urine: 1.008 (ref 1.005–1.030)
pH: 8 (ref 5.0–8.0)

## 2020-06-30 LAB — BPAM RBC
Blood Product Expiration Date: 202206232359
Blood Product Expiration Date: 202206262359
Blood Product Expiration Date: 202206262359
Blood Product Expiration Date: 202206262359
ISSUE DATE / TIME: 202206040459
ISSUE DATE / TIME: 202206040841
Unit Type and Rh: 6200
Unit Type and Rh: 6200
Unit Type and Rh: 6200
Unit Type and Rh: 6200

## 2020-06-30 LAB — BRAIN NATRIURETIC PEPTIDE: B Natriuretic Peptide: 257.4 pg/mL — ABNORMAL HIGH (ref 0.0–100.0)

## 2020-06-30 LAB — BASIC METABOLIC PANEL
Anion gap: 10 (ref 5–15)
BUN: 91 mg/dL — ABNORMAL HIGH (ref 8–23)
CO2: 25 mmol/L (ref 22–32)
Calcium: 8.7 mg/dL — ABNORMAL LOW (ref 8.9–10.3)
Chloride: 98 mmol/L (ref 98–111)
Creatinine, Ser: 3.94 mg/dL — ABNORMAL HIGH (ref 0.44–1.00)
GFR, Estimated: 12 mL/min — ABNORMAL LOW (ref >60)
Glucose, Bld: 74 mg/dL (ref 70–99)
Potassium: 4.5 mmol/L (ref 3.5–5.1)
Sodium: 133 mmol/L — ABNORMAL LOW (ref 135–145)

## 2020-06-30 LAB — CBC
HCT: 24.3 % — ABNORMAL LOW (ref 36.0–46.0)
Hemoglobin: 8.1 g/dL — ABNORMAL LOW (ref 12.0–15.0)
MCH: 32.1 pg (ref 26.0–34.0)
MCHC: 33.3 g/dL (ref 30.0–36.0)
MCV: 96.4 fL (ref 80.0–100.0)
Platelets: 221 10*3/uL (ref 150–400)
RBC: 2.52 MIL/uL — ABNORMAL LOW (ref 3.87–5.11)
RDW: 16.8 % — ABNORMAL HIGH (ref 11.5–15.5)
WBC: 7.4 10*3/uL (ref 4.0–10.5)
nRBC: 0.7 % — ABNORMAL HIGH (ref 0.0–0.2)

## 2020-06-30 LAB — TYPE AND SCREEN
ABO/RH(D): A POS
Antibody Screen: NEGATIVE
Unit division: 0
Unit division: 0
Unit division: 0
Unit division: 0

## 2020-06-30 LAB — HEPATIC FUNCTION PANEL
ALT: 19 U/L (ref 0–44)
AST: 20 U/L (ref 15–41)
Albumin: 3.3 g/dL — ABNORMAL LOW (ref 3.5–5.0)
Alkaline Phosphatase: 63 U/L (ref 38–126)
Bilirubin, Direct: 0.2 mg/dL (ref 0.0–0.2)
Indirect Bilirubin: 1.2 mg/dL — ABNORMAL HIGH (ref 0.3–0.9)
Total Bilirubin: 1.4 mg/dL — ABNORMAL HIGH (ref 0.3–1.2)
Total Protein: 6.9 g/dL (ref 6.5–8.1)

## 2020-06-30 LAB — CBG MONITORING, ED
Glucose-Capillary: 68 mg/dL — ABNORMAL LOW (ref 70–99)
Glucose-Capillary: 106 mg/dL — ABNORMAL HIGH (ref 70–99)
Glucose-Capillary: 127 mg/dL — ABNORMAL HIGH (ref 70–99)

## 2020-06-30 LAB — TROPONIN I (HIGH SENSITIVITY): Troponin I (High Sensitivity): 21 ng/L — ABNORMAL HIGH (ref <18)

## 2020-06-30 NOTE — ED Triage Notes (Signed)
Pt arrives with worsening shob x 2 days. Having dark tarry stool for the last several months and is being followed up for same. Received 2 units of blood on Friday.

## 2020-06-30 NOTE — ED Provider Notes (Signed)
Medical screening examination/treatment/procedure(s) were conducted as a shared visit with non-physician practitioner(s) and myself.  I personally evaluated the patient during the encounter.  Clinical Impression:   Final diagnoses:  Shortness of breath  Anemia, unspecified type    This patient has a history of diabetes, congestive heart failure, has had multiple strokes, recently admitted to the hospital and discharged yesterday.  Presents again today with increasing shortness of breath.  On exam the patient states she is not short of breath anymore, her lungs are clear, she has no obvious murmurs, there is no significant edema, she is awake alert and in no distress.  Vital signs without fever tachycardia hypotension or hypoxia.  Labs reviewed and unremarkable, and urinalysis as the patient has had some urinary frequency.  She is agreeable       Noemi Chapel, MD 07/01/20 [phone]

## 2020-06-30 NOTE — ED Provider Notes (Signed)
[facility] EMERGENCY DEPARTMENT Provider Note   CSN: [id number] Arrival date & time: 06/30/20  1417     History Chief Complaint  Patient presents with  . Shortness of Breath    CARAL WHAN is a 70 y.o. female w PMHx stroke, diastolic CHF, N0NL, CKD, anemia, HTN.  Patient was just discharged from hospital admission yesterday, she was admitted on 06/26/2020 for symptomatic anemia with hemoglobin of 5.  She was transfused 2 units.  Hemoglobin was stable throughout admission, this was noted to be a chronic recurring anemia, thought to be attributed to CKD though concerns for GI bleed. She has active GI capsule study, and plans to follow-up with GI outpatient.  Patient states she was at home today doing chores such as starting a load of laundry. She states she began feeling very winded so she sat down. This improved with rest and has not recurred since. She had dry mouth at the time. Has not had a BM today, last BM was in the hospital yesterday prior to discharge.  States she has been having chronically dark stools.  Denies associated chest pain, palpitations, lower extremity edema.  Has been taking her medications as directed.  She had a cardiology visit this morning per chart review, patient states she was feeling well at that visit.  The history is provided by the patient and medical records.       Past Medical History:  Diagnosis Date  . Acute kidney injury ([place]) 01/08/2015  . Arthritis   . Chronic back pain   . Constipation   . Cough   . Diabetes mellitus, type 2 ([place])   . Diverticulosis   . Fibroid    patient thinks this was the reason for her hysterectomy  . GERD (gastroesophageal reflux disease)   . Heart murmur   . History of sebaceous cyst   . Hyperlipemia   . Hyperplastic colon polyp   . Hypertension   . Hyperthyroidism   . Lumbar radiculopathy   . Shortness of breath 09/13/2013  . Stroke ([place]) 2015?   x4   . Tobacco abuse   . Ulceration of  intestine- IC valve - thought due to colon prep 12/2018    Patient Active Problem List   Diagnosis Date Noted  . Symptomatic anemia 06/27/2020  . Acute on chronic diastolic CHF (congestive heart failure) ([place]) 06/04/2020  . Shortness of breath 06/03/2020  . GERD without esophagitis 06/03/2020  . Elevated troponin level not due myocardial infarction 06/03/2020  . Heme positive stool   . GI bleed 05/18/2020  . CKD (chronic kidney disease), stage IV ([place]) 05/18/2020  . Knee pain 05/18/2020  . Right flank discomfort 11/28/2019  . Vitamin D toxicity, accidental or unintentional, initial encounter 09/09/2017  . Ataxia 09/09/2017  . Type 2 diabetes mellitus with stage 4 chronic kidney disease, without long-term current use of insulin ([place]) 09/09/2017  . Chronic kidney disease (CKD)   . Olecranon bursitis of right elbow 03/09/2017  . Thyroid nodule 06/29/2016  . Type 2 diabetes mellitus with proliferative retinopathy without macular edema, unspecified laterality, unspecified whether long term insulin use ([place]) 06/13/2016  . Vitamin D deficiency 01/12/2016  . Medicare annual wellness visit, initial 01/11/2016  . Lacunar infarct, acute ([place]) 03/23/2015  . Non compliance w medication regimen 01/16/2015  . Anemia 01/08/2015  . Stroke ([place]) 01/08/2015  . Mixed diabetic hyperlipidemia associated with type 2 diabetes mellitus ([place])   . Carotid bruit present 12/13/2012  . Routine health  maintenance 10/24/2010  . PERIPHERAL EDEMA 09/10/2009  . ELECTROCARDIOGRAM, ABNORMAL 04/11/2008  . HEART MURMUR, SYSTOLIC 68/03/2120  . SEBACEOUS CYST, NECK 12/13/2006  . Hyperthyroidism 09/27/2006  . Secondary diabetes with peripheral vascular disease ([place]) 09/27/2006  . Hyperlipidemia LDL goal <70 09/27/2006  . TOBACCO ABUSE 09/27/2006  . Hypertensive urgency 09/27/2006    Past Surgical History:  Procedure Laterality Date  . ABDOMINAL HYSTERECTOMY     --?ovaries remain  . COLONOSCOPY W/ BIOPSIES   09/11/2008  . ESOPHAGOGASTRODUODENOSCOPY (EGD) WITH PROPOFOL N/A 05/20/2020   Procedure: ESOPHAGOGASTRODUODENOSCOPY (EGD) WITH PROPOFOL;  Surgeon: Irene Shipper, MD;  Location: [facility] ENDOSCOPY;  Service: Endoscopy;  Laterality: N/A;  . GIVENS CAPSULE STUDY N/A 06/28/2020   Procedure: GIVENS CAPSULE STUDY;  Surgeon: Irene Shipper, MD;  Location: [facility];  Service: Endoscopy;  Laterality: N/A;  . RETINOPATHY SURGERY Bilateral 2013   Dr. Zigmund Daniel     OB History    Gravida  1   Para  1   Term      Preterm      AB      Living  1     SAB      IAB      Ectopic      Multiple      Live Births              Family History  Problem Relation Age of Onset  . Hypertension Mother   . Sleep apnea Mother   . Diabetes Mother   . Hyperlipidemia Mother   . Lung cancer Father        smoker  . Hypertension Sister   . Diabetes Sister   . Hypertension Brother   . Hypertension Sister   . Diabetes Brother   . Hypertension Brother   . Breast cancer Neg Hx   . Colon cancer Neg Hx   . Esophageal cancer Neg Hx   . Pancreatic cancer Neg Hx   . Liver disease Neg Hx   . Colon polyps Neg Hx     Social History   Tobacco Use  . Smoking status: Former Smoker    Packs/day: 0.50    Years: 35.00    Pack years: 17.50    Types: Cigarettes    Quit date: 07/07/2014    Years since quitting: 5.9  . Smokeless tobacco: Never Used  Vaping Use  . Vaping Use: Never used  Substance Use Topics  . Alcohol use: No  . Drug use: No    Home Medications Prior to Admission medications   Medication Sig Start Date End Date Taking? Authorizing Provider  acarbose (PRECOSE) 25 MG tablet Take 0.5 tablets (12.5 mg total) by mouth 3 (three) times daily with meals. 02/25/20   Renato Shin, MD  acetaminophen (TYLENOL) 500 MG tablet Take 1,000 mg by mouth every 6 (six) hours as needed for moderate pain.    [provider]  allopurinol (ZYLOPRIM) 100 MG tablet Take 1 tablet (100 mg total) by mouth  daily. 05/23/20   Ghimire, Henreitta Leber, MD  amLODipine (NORVASC) 10 MG tablet TAKE 1 TABLET BY MOUTH EVERY DAY Patient taking differently: Take 10 mg by mouth daily. 03/16/20   Marrian Salvage, FNP  atorvastatin (LIPITOR) 80 MG tablet TAKE 1 TABLET BY MOUTH EVERY DAY Patient taking differently: Take 80 mg by mouth daily. 03/30/20   Marrian Salvage, FNP  Blood Glucose Monitoring Suppl ([place]) w/Device KIT 1 each by Does not apply route 2 (  two) times daily. E11.9    [provider]  bromocriptine (PARLODEL) 2.5 MG tablet Take 0.5 tablets (1.25 mg total) by mouth daily. 02/24/20   Romero Belling, MD  epoetin alfa-epbx (RETACRIT) 20100 UNIT/ML injection Inject 40,000 Units into the skin every 28 (twenty-eight) days.    [provider]  glucose blood (ONETOUCH VERIO) test strip USE AS DIRECTED TWICE DAILY 12/12/19   Olive Bass, FNP  hydrALAZINE (APRESOLINE) 50 MG tablet Take 1 tablet (50 mg total) by mouth every 8 (eight) hours. 06/10/20 07/10/20  Noralee Stain, DO  isosorbide mononitrate (IMDUR) 30 MG 24 hr tablet Take 1 tablet (30 mg total) by mouth daily. 06/11/20   Noralee Stain, DO  metoprolol succinate (TOPROL-XL) 100 MG 24 hr tablet Take 100 mg by mouth daily.    [provider]  minoxidil (LONITEN) 10 MG tablet Take 10 mg by mouth 2 (two) times daily.    [provider]  OneTouch Delica Lancets 33G MISC 1 each by Does not apply route 2 (two) times daily. E11.9    [provider]  pantoprazole (PROTONIX) 40 MG tablet Take 1 tablet (40 mg total) by mouth daily. 06/12/19   Olive Bass, FNP  polyethylene glycol (MIRALAX / GLYCOLAX) 17 g packet Take 34-51 g by mouth daily as needed for mild constipation.    [provider]  repaglinide (PRANDIN) 2 MG tablet Take 1 tablet (2 mg total) by mouth 2 (two) times daily before a meal. 01/10/20   Romero Belling, MD  torsemide (DEMADEX) 20 MG tablet Take 4  tablets (80 mg total) by mouth 2 (two) times daily. 06/10/20 07/10/20  Noralee Stain, DO    Allergies    Semaglutide  Review of Systems   Review of Systems  Respiratory: Positive for shortness of breath.   All other systems reviewed and are negative.   Physical Exam Updated Vital Signs BP (!) 147/54 (BP Location: Right Arm)   Pulse 68   Temp 98.2 F (36.8 C) (Oral)   Resp 18   SpO2 100%   Physical Exam Vitals and nursing note reviewed.  Constitutional:      General: She is not in acute distress.    Appearance: She is well-developed.  HENT:     Head: Normocephalic and atraumatic.  Eyes:     Conjunctiva/sclera: Conjunctivae normal.  Cardiovascular:     Rate and Rhythm: Normal rate and regular rhythm.     Heart sounds: Murmur heard.    Pulmonary:     Effort: Pulmonary effort is normal.     Breath sounds: Normal breath sounds.  Abdominal:     General: Bowel sounds are normal.     Palpations: Abdomen is soft.     Tenderness: There is no abdominal tenderness.  Musculoskeletal:     Right lower leg: No edema.     Left lower leg: No edema.  Skin:    General: Skin is warm.  Neurological:     Mental Status: She is alert.  Psychiatric:        Behavior: Behavior normal.     ED Results / Procedures / Treatments   Labs (all labs ordered are listed, but only abnormal results are displayed) Labs Reviewed  CBC - Abnormal; Notable for the following components:      Result Value   RBC 2.52 (*)    Hemoglobin 8.1 (*)    HCT 24.3 (*)    RDW 16.8 (*)    nRBC 0.7 (*)  All other components within normal limits  BASIC METABOLIC PANEL - Abnormal; Notable for the following components:   Sodium 133 (*)    BUN 91 (*)    Creatinine, Ser 3.94 (*)    Calcium 8.7 (*)    GFR, Estimated 12 (*)    All other components within normal limits  BRAIN NATRIURETIC PEPTIDE - Abnormal; Notable for the following components:   B Natriuretic Peptide 257.4 (*)    All other components within  normal limits  HEPATIC FUNCTION PANEL - Abnormal; Notable for the following components:   Albumin 3.3 (*)    Total Bilirubin 1.4 (*)    Indirect Bilirubin 1.2 (*)    All other components within normal limits  URINALYSIS, ROUTINE W REFLEX MICROSCOPIC - Abnormal; Notable for the following components:   Color, Urine STRAW (*)    Protein, ur 30 (*)    Bacteria, UA MANY (*)    All other components within normal limits  CBG MONITORING, ED - Abnormal; Notable for the following components:   Glucose-Capillary 68 (*)    All other components within normal limits  CBG MONITORING, ED - Abnormal; Notable for the following components:   Glucose-Capillary 106 (*)    All other components within normal limits  CBG MONITORING, ED - Abnormal; Notable for the following components:   Glucose-Capillary 127 (*)    All other components within normal limits  TROPONIN I (HIGH SENSITIVITY) - Abnormal; Notable for the following components:   Troponin I (High Sensitivity) 21 (*)    All other components within normal limits  URINE CULTURE    EKG EKG Interpretation  Date/Time:  Tuesday June 30 2020 14:26:19 EDT Ventricular Rate:  66 PR Interval:  152 QRS Duration: 80 QT Interval:  468 QTC Calculation: 490 R Axis:   -74 Text Interpretation: Normal sinus rhythm Possible Left atrial enlargement Left anterior fascicular block Minimal voltage criteria for LVH, may be normal variant ( Cornell product ) Cannot rule out Anteroseptal infarct , age undetermined Abnormal ECG Confirmed by Noemi Chapel [phone]) on 06/30/2020 4:52:09 PM   Radiology DG Chest 2 View  Result Date: 06/30/2020 CLINICAL DATA:  Shortness of breath. EXAM: CHEST - 2 VIEW COMPARISON:  Chest x-ray 06/26/2020. FINDINGS: Mediastinum is normal. Cardiomegaly with pulmonary venous prominence. Bilateral interstitial prominence noted on today's exam. Findings suggest CHF. Pneumonitis cannot be excluded. No pneumothorax. Degenerative change thoracic spine.  IMPRESSION: Cardiomegaly with pulmonary venous prominence. Bilateral interstitial prominence noted on today's exam. Findings suggest CHF. Pneumonitis cannot be excluded. Electronically Signed   By: Marcello Moores  Register   On: 06/30/2020 15:18    Procedures Procedures   Medications Ordered in ED Medications - No data to display  ED Course  I have reviewed the triage vital signs and the nursing notes.  Pertinent labs & imaging results that were available during my care of the patient were reviewed by me and considered in my medical decision making (see chart for details).  Clinical Course as of 06/30/20 2248  Tue Jun 30, 2020  1956 Attending physician evaluated patient. She now mentions urinary frequency. Will add on U/A [JR]    Clinical Course User Index [JR] Chanita Boden, [place] N, PA-C   MDM Rules/Calculators/A&P                          Patient with presentation as above.  She was just discharged from hospital admission yesterday for symptomatic anemia.  Is not having any gross  melena today, last BM was yesterday while admitted.  She had an episode of shortness of breath today with some mild exertion at home while she was getting laundry together.  She felt winded, this improved with rest.  She did not have any chest pain, diaphoresis, nausea with this.  She had some dry mouth.  This resolved with rest and she has not had any recurrence.  She has no overt signs of fluid overload here in the ED, no peripheral edema or rales in the lungs.  She does have some findings on chest x-ray though with review of previous most recent chest x-ray a few days ago, no significant differences noted.  She is not hypoxic or tachypneic.  EKG is nonischemic.  BNP is unremarkable at 250.  Hemoglobin has down trended some since yesterday.  Initial CBG was low, she is given juice and crackers and this improved and is stable on recheck.  Initial troponin is also at baseline.  Low suspicion for ACS.  She is ambulating in the  ED without recurrence of shortness of breath, no hypoxia or tachypnea.   Patient discussed with and evaluated by attending physician Dr. Sabra Heck.  At this time believe patient is appropriate for discharge to home with close follow-up with her PCP and GI doctor.  Recommend trend her hemoglobin with PCP, return if symptoms recur or worsen.  She is in agreement with this care plan, and is discharged in no distress.  Discussed results, findings, treatment and follow up. Patient advised of return precautions. Patient verbalized understanding and agreed with plan.   Final Clinical Impression(s) / ED Diagnoses Final diagnoses:  Shortness of breath  Anemia, unspecified type    Rx / DC Orders ED Discharge Orders    None       Gabbrielle Mcnicholas, [place] N, PA-C 06/30/20 2255    Noemi Chapel, MD 07/01/20 [phone]

## 2020-06-30 NOTE — ED Notes (Addendum)
Laboratory notified/follow up  on patient's pending Troponin blood test .

## 2020-06-30 NOTE — Patient Instructions (Addendum)
Medication Instructions:  The current medical regimen is effective;  continue present plan and medications as directed. Please refer to the Current Medication list given to you today.  *If you need a refill on your cardiac medications before your next appointment, please call your pharmacy*  Lab Work:   Testing/Procedures:  NONE    NONE  Special Instructions TAKE AND LOG YOUR WEIGHT DAILY   PLEASE READ AND FOLLOW SALTY 6-ATTACHED-1,800mg  daily  PLEASE INCREASE PHYSICAL ACTIVITY AS TOLERATED  FLUID RESTRICTION 48-64 OUNCES  Follow-Up: Your next appointment:  3 month(s) In Person with K. Mali Hilty, MD ONLY  At [facility], you and your health needs are our priority.  As part of our continuing mission to provide you with exceptional heart care, we have created designated Provider Care Teams.  These Care Teams include your primary Cardiologist (physician) and Advanced Practice Providers (APPs -  Physician Assistants and Nurse Practitioners) who all work together to provide you with the care you need, when you need it.  We recommend signing up for the patient portal called "MyChart".  Sign up information is provided on this After Visit Summary.  MyChart is used to connect with patients for Virtual Visits (Telemedicine).  Patients are able to view lab/test results, encounter notes, upcoming appointments, etc.  Non-urgent messages can be sent to your provider as well.   To learn more about what you can do with MyChart, go to [email or web address].              6 SALTY THINGS TO AVOID     1,800MG  DAILY

## 2020-06-30 NOTE — ED Notes (Signed)
Pt given OJ and graham crackers

## 2020-06-30 NOTE — Discharge Instructions (Addendum)
Please follow close with your primary care provider and the GI specialists.  It is important your hemoglobin is closely monitored for any worsening anemia. Return to the emergency department if you develop recurrent worsening shortness of breath, chest pain, or other concerning symptoms.

## 2020-06-30 NOTE — ED Notes (Signed)
Pt ambulated in the room with a steady gait, with Spo2 saturation remaining at 100% RA. Pt states that she did not feel short of breath when walking.

## 2020-06-30 NOTE — ED Provider Notes (Signed)
Emergency Medicine Provider Triage Evaluation Note  [name] , a 70 y.o. female  was evaluated in triage.  Pt complains of shortness of breath.  She has had dark tarry stools for the past several months and is being followed for same.  Had two units of blood on Friday.  Shob started this morning.  No chest pain.  Review of Systems  Positive: Shortness of breath Negative: Fevers, CP  Physical Exam  BP (!) 123/47 (BP Location: Left Arm)   Pulse 66   Temp 98.2 F (36.8 C) (Oral)   Resp 14   SpO2 96%  Gen:   Awake, no distress  Resp:  Normal effort ,CTAB MSK:   Moves extremities without difficulty  Other:  Awake and alert.  Medical Decision Making  Medically screening exam initiated at 2:36 PM.  Appropriate orders placed.  [name] was informed that the remainder of the evaluation will be completed by another provider, this initial triage assessment does not replace that evaluation, and the importance of remaining in the ED until their evaluation is complete.     Lorin Glass, PA-C 06/30/20 1437    Charlesetta Shanks, MD 07/07/20 2208

## 2020-07-01 DIAGNOSIS — E785 Hyperlipidemia, unspecified: Secondary | ICD-10-CM | POA: Diagnosis not present

## 2020-07-01 DIAGNOSIS — N179 Acute kidney failure, unspecified: Secondary | ICD-10-CM | POA: Diagnosis not present

## 2020-07-01 DIAGNOSIS — M1712 Unilateral primary osteoarthritis, left knee: Secondary | ICD-10-CM | POA: Diagnosis not present

## 2020-07-01 DIAGNOSIS — G8929 Other chronic pain: Secondary | ICD-10-CM | POA: Diagnosis not present

## 2020-07-01 DIAGNOSIS — M109 Gout, unspecified: Secondary | ICD-10-CM | POA: Diagnosis not present

## 2020-07-01 DIAGNOSIS — E1122 Type 2 diabetes mellitus with diabetic chronic kidney disease: Secondary | ICD-10-CM | POA: Diagnosis not present

## 2020-07-01 DIAGNOSIS — E875 Hyperkalemia: Secondary | ICD-10-CM | POA: Diagnosis not present

## 2020-07-01 DIAGNOSIS — K5900 Constipation, unspecified: Secondary | ICD-10-CM | POA: Diagnosis not present

## 2020-07-01 DIAGNOSIS — E1165 Type 2 diabetes mellitus with hyperglycemia: Secondary | ICD-10-CM | POA: Diagnosis not present

## 2020-07-01 DIAGNOSIS — D631 Anemia in chronic kidney disease: Secondary | ICD-10-CM | POA: Diagnosis not present

## 2020-07-01 DIAGNOSIS — I129 Hypertensive chronic kidney disease with stage 1 through stage 4 chronic kidney disease, or unspecified chronic kidney disease: Secondary | ICD-10-CM | POA: Diagnosis not present

## 2020-07-01 DIAGNOSIS — M5416 Radiculopathy, lumbar region: Secondary | ICD-10-CM | POA: Diagnosis not present

## 2020-07-01 DIAGNOSIS — N184 Chronic kidney disease, stage 4 (severe): Secondary | ICD-10-CM | POA: Diagnosis not present

## 2020-07-02 ENCOUNTER — Telehealth: Payer: Self-pay

## 2020-07-02 LAB — URINE CULTURE

## 2020-07-02 NOTE — Telephone Encounter (Signed)
Spoke with pt and she is aware. Report sent to [facility].

## 2020-07-02 NOTE — Telephone Encounter (Signed)
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 She will need to have further evaluation with her primary gastroenterologist Dr. Benny Lennert and will need enteroscopy done.  Please ask the patient to call Dr. Kathrin Greathouse office for follow-up appointment - needs to be seen with  a couple weeks  Please fax a copy of the capsule endoscopy report which will be sitting on my desk to Dr. Rolm Bookbinder with a cover sheet that says patient was just hospitalized with symptomatic anemia, please review this capsule endoscopy and patient will need to be scheduled for enteroscopythrough your office  Thank you

## 2020-07-03 ENCOUNTER — Telehealth: Payer: Self-pay | Admitting: Hematology and Oncology

## 2020-07-03 DIAGNOSIS — E1122 Type 2 diabetes mellitus with diabetic chronic kidney disease: Secondary | ICD-10-CM | POA: Diagnosis not present

## 2020-07-03 DIAGNOSIS — E875 Hyperkalemia: Secondary | ICD-10-CM | POA: Diagnosis not present

## 2020-07-03 DIAGNOSIS — G8929 Other chronic pain: Secondary | ICD-10-CM | POA: Diagnosis not present

## 2020-07-03 DIAGNOSIS — E1165 Type 2 diabetes mellitus with hyperglycemia: Secondary | ICD-10-CM | POA: Diagnosis not present

## 2020-07-03 DIAGNOSIS — N184 Chronic kidney disease, stage 4 (severe): Secondary | ICD-10-CM | POA: Diagnosis not present

## 2020-07-03 DIAGNOSIS — D631 Anemia in chronic kidney disease: Secondary | ICD-10-CM | POA: Diagnosis not present

## 2020-07-03 DIAGNOSIS — M5416 Radiculopathy, lumbar region: Secondary | ICD-10-CM | POA: Diagnosis not present

## 2020-07-03 DIAGNOSIS — E785 Hyperlipidemia, unspecified: Secondary | ICD-10-CM | POA: Diagnosis not present

## 2020-07-03 DIAGNOSIS — I129 Hypertensive chronic kidney disease with stage 1 through stage 4 chronic kidney disease, or unspecified chronic kidney disease: Secondary | ICD-10-CM | POA: Diagnosis not present

## 2020-07-03 DIAGNOSIS — N179 Acute kidney failure, unspecified: Secondary | ICD-10-CM | POA: Diagnosis not present

## 2020-07-03 DIAGNOSIS — K5900 Constipation, unspecified: Secondary | ICD-10-CM | POA: Diagnosis not present

## 2020-07-03 DIAGNOSIS — M1712 Unilateral primary osteoarthritis, left knee: Secondary | ICD-10-CM | POA: Diagnosis not present

## 2020-07-03 DIAGNOSIS — M109 Gout, unspecified: Secondary | ICD-10-CM | POA: Diagnosis not present

## 2020-07-03 NOTE — Telephone Encounter (Signed)
Scheduled appt per 6/6 referral. Pt aware.

## 2020-07-05 NOTE — H&P (View-Only) (Signed)
[facility] [place]  ASSESSMENT / PLAN: [name] is a 70 y.o. right handed female in need of permanent hemodialysis access. I reviewed options for dialysis in detail with the patient. I counseled the patient that dialysis access requires surveillance and periodic maintenance. Plan to proceed with brachiocephalic arteriovenous fistula in near future. Patient will call with a date preference.    CHIEF COMPLAINT: in need of hd access  HISTORY OF PRESENT ILLNESS: [name] is a 70 y.o. 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  The patient is right-handed.  She has dialysis access before.  She has never had a pacemaker or central venous catheter.  Past Medical History:  Diagnosis Date   Acute kidney injury (HCC) 01/08/2015   Arthritis    Chronic back pain    Constipation    Cough    Diabetes mellitus, type 2 (HCC)    Diverticulosis    Fibroid    patient thinks this was the reason for her hysterectomy   GERD (gastroesophageal reflux disease)    Heart murmur    History of sebaceous cyst    Hyperlipemia    Hyperplastic colon polyp    Hypertension    Hyperthyroidism    Lumbar radiculopathy    Shortness of breath 09/13/2013   Stroke (HCC) 2015?   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 matthews     Family History  Problem Relation Age of Onset   Hypertension Mother    Sleep apnea Mother    Diabetes Mother    Hyperlipidemia Mother    Lung cancer Father        smoker   Hypertension Sister    Diabetes Sister    Hypertension Brother    Hypertension Sister    Diabetes Brother    Hypertension Brother    Breast cancer Neg Hx    Colon cancer Neg Hx    Esophageal cancer Neg Hx    Pancreatic cancer Neg Hx    Liver disease Neg Hx    Colon polyps Neg Hx     Social History   Socioeconomic History   Marital status: Divorced    Spouse name: Not on file   Number of children: 1   Years of education: 11   Highest education level: Not on file  Occupational History   Occupation: Weaver    Employer: [employer]    Comment: Retired  Tobacco Use   Smoking status: Former    Packs/day: 0.50    Years: 35.00    Pack years: 17.50    Types: Cigarettes    Quit date: 07/07/2014    Years since quitting: 6.0   Smokeless tobacco: Never  Vaping Use   Vaping Use: Never used  Substance and Sexual Activity   Alcohol use: No     Drug use: No   Sexual activity: Never    Partners: Male    Birth control/protection: Surgical    Comment: TAH--?ovaries remain  Other Topics Concern   Not on file  Social History Narrative   Divorced 1 son    Retired Weaver for [employer]   Former smoker no alcohol caffeine or drug use report denies abuse and feels safe at home.   Social Determinants of Health   Financial Resource Strain: Medium Risk   Difficulty of Paying Living Expenses: Somewhat hard  Food Insecurity: Food Insecurity Present   Worried About Running Out of Food in the Last Year: Often true   Ran Out of Food in the Last Year: Sometimes true  Transportation Needs: No Transportation Needs   Lack of Transportation (Medical): No   Lack of Transportation (Non-Medical): No  Physical Activity: Sufficiently Active   Days of Exercise per Week: 5 days   Minutes  of Exercise per Session: 30 min  Stress: No Stress Concern Present   Feeling of Stress : Not at all  Social Connections: Not on file  Intimate Partner Violence: Not At Risk   Fear of Current or Ex-Partner: No   Emotionally Abused: No   Physically Abused: No   Sexually Abused: No    Allergies  Allergen Reactions   Semaglutide Rash    Rybelsus 3 mg    Current Outpatient Medications  Medication Sig Dispense Refill   acarbose (PRECOSE) 25 MG tablet Take 0.5 tablets (12.5 mg total) by mouth 3 (three) times daily with meals. 135 tablet 1   acetaminophen (TYLENOL) 500 MG tablet Take 1,000 mg by mouth every 6 (six) hours as needed for moderate pain.     allopurinol (ZYLOPRIM) 100 MG tablet Take 1 tablet (100 mg total) by mouth daily. 30 tablet 0   amLODipine (NORVASC) 10 MG tablet TAKE 1 TABLET BY MOUTH EVERY DAY (Patient taking differently: Take 10 mg by mouth daily.) 90 tablet 0   atorvastatin (LIPITOR) 80 MG tablet TAKE 1 TABLET BY MOUTH EVERY DAY (Patient taking differently: Take 80 mg by mouth daily.) 90 tablet 1   Blood Glucose Monitoring Suppl (ONETOUCH VERIO FLEX SYSTEM) w/Device KIT 1 each by Does not apply route 2 (two) times daily. E11.9     bromocriptine (PARLODEL) 2.5 MG tablet Take 0.5 tablets (1.25 mg total) by mouth daily. 45 tablet 1   epoetin alfa-epbx (RETACRIT) 40000 UNIT/ML injection Inject 40,000 Units into the skin every 28 (twenty-eight) days.     glucose blood (ONETOUCH VERIO) test strip USE AS DIRECTED TWICE DAILY 200 strip 3   hydrALAZINE (APRESOLINE) 50 MG tablet Take 1 tablet (50 mg total) by mouth every 8 (eight) hours. 90 tablet 0   isosorbide mononitrate (IMDUR) 30 MG 24 hr tablet Take 1 tablet (30 mg total) by mouth daily. 30 tablet 0   metoprolol succinate (TOPROL-XL) 100 MG 24 hr tablet Take 100 mg by mouth daily.     minoxidil (LONITEN) 10 MG tablet Take 10 mg by mouth 2 (two) times daily.     OneTouch Delica Lancets 33G MISC 1 each by Does not apply route  2 (two) times daily. E11.9     pantoprazole (PROTONIX) 40 MG tablet Take 1 tablet (40 mg total) by mouth daily. 90 tablet 3   polyethylene glycol (MIRALAX / GLYCOLAX) 17 g packet Take 34-51 g by mouth daily as needed for mild constipation.     repaglinide (PRANDIN) 2 MG tablet Take 1 tablet (  2 mg total) by mouth 2 (two) times daily before a meal. 180 tablet 3   torsemide (DEMADEX) 20 MG tablet Take 4 tablets (80 mg total) by mouth 2 (two) times daily. 240 tablet 0   No current facility-administered medications for this visit.    REVIEW OF SYSTEMS:  [X] denotes positive finding, [ ] denotes negative finding Cardiac  Comments:  Chest pain or chest pressure:    Shortness of breath upon exertion:    Short of breath when lying flat:    Irregular heart rhythm:        Vascular    Pain in calf, thigh, or hip brought on by ambulation:    Pain in feet at night that wakes you up from your sleep:     Blood clot in your veins:    Leg swelling:         Pulmonary    Oxygen at home:    Productive cough:     Wheezing:         Neurologic    Sudden weakness in arms or legs:     Sudden numbness in arms or legs:     Sudden onset of difficulty speaking or slurred speech:    Temporary loss of vision in one eye:     Problems with dizziness:         Gastrointestinal    Blood in stool:     Vomited blood:         Genitourinary    Burning when urinating:     Blood in urine:        Psychiatric    Major depression:         Hematologic    Bleeding problems:    Problems with blood clotting too easily:        Skin    Rashes or ulcers:        Constitutional    Fever or chills:      PHYSICAL EXAM Vitals:   07/07/20 1514  BP: (!) 122/58  Pulse: 65  Resp: 20  Temp: 99 F (37.2 C)  SpO2: 98%  Weight: 128 lb (58.1 kg)  Height: 5' 2" (1.575 m)    Constitutional: well appearing. no distress. Appears well nourished.  Neurologic: CN intact. no focal findings. no sensory  loss. Psychiatric: Mood and affect symmetric and appropriate. Eyes: No icterus. No conjunctival pallor. Ears, nose, throat: mucous membranes moist. Midline trachea.  Cardiac: regular rate and rhythm.  Respiratory: unlabored. Abdominal: soft, non-tender, non-distended.  Peripheral vascular: 2+ radial pulses Extremity: no edema. no cyanosis. no pallor.  Skin: no gangrene. no ulceration.  Lymphatic: no Stemmer's sign. no palpable lymphadenopathy.  PERTINENT LABORATORY AND RADIOLOGIC DATA  Most recent CBC CBC Latest Ref Rng & Units 06/30/2020 06/29/2020 06/28/2020  WBC 4.0 - 10.5 K/uL 7.4 6.5 7.5  Hemoglobin 12.0 - 15.0 g/dL 8.1(L) 9.3(L) 9.4(L)  Hematocrit 36.0 - 46.0 % 24.3(L) 28.0(L) 27.7(L)  Platelets 150 - 400 K/uL 221 230 220     Most recent CMP CMP Latest Ref Rng & Units 06/30/2020 06/29/2020 06/28/2020  Glucose 70 - 99 mg/dL 74 107(H) 89  BUN 8 - 23 mg/dL 91(H) 92(H) 93(H)  Creatinine 0.44 - 1.00 mg/dL 3.94(H) 4.16(H) 4.15(H)  Sodium 135 - 145 mmol/L 133(L) 138 137  Potassium 3.5 - 5.1 mmol/L 4.5 3.4(L) 3.7  Chloride 98 - 111 mmol/L 98 101 100  CO2 22 - 32 mmol/L _0 Calcium 8.9 - 10.3 mg/dL 8.7(L)  9.0 8.8(L)  Total Protein 6.5 - 8.1 g/dL 6.9 - -  Total Bilirubin 0.3 - 1.2 mg/dL 1.4(H) - -  Alkaline Phos 38 - 126 U/L 63 - -  AST 15 - 41 U/L 20 - -  ALT 0 - 44 U/L 19 - -    Renal function Estimated Creatinine Clearance: 10.5 mL/min (A) (by C-G formula based on SCr of 3.94 mg/dL (H)).  Hemoglobin A1C (%)  Date Value  06/24/2020 6.0 (A)   Hgb A1c MFr Bld (%)  Date Value  06/03/2020 6.2 (H)    LDL Cholesterol  Date Value Ref Range Status  07/11/2018 80 0 - 99 mg/dL Final   Direct LDL  Date Value Ref Range Status  07/10/2017 79.0 mg/dL Final    Comment:    Optimal:  <100 mg/dLNear or Above Optimal:  100-129 mg/dLBorderline High:  130-159 mg/dLHigh:  160-189 mg/dLVery High:  >190 mg/dL     Vascular Imaging: Vein mapping shows adequate cephalic vein for  brachiocephalic fistula on either arm.  Draylon Mercadel N. Meia Emley, MD [facility] [place] Office Phone Number: [phone] 07/05/2020 9:52 PM    

## 2020-07-05 NOTE — Progress Notes (Signed)
[facility] [place]  ASSESSMENT / PLAN: [name] is a 70 y.o. right handed female in need of permanent hemodialysis access. I reviewed options for dialysis in detail with the patient. I counseled the patient that dialysis access requires surveillance and periodic maintenance. Plan to proceed with brachiocephalic arteriovenous fistula in near future. Patient will call with a date preference.    CHIEF COMPLAINT: in need of hd access  HISTORY OF PRESENT ILLNESS: [name] is a 70 y.o. 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  The patient is right-handed.  She has dialysis access before.  She has never had a pacemaker or central venous catheter.  Past Medical History:  Diagnosis Date   Acute kidney injury ([place]) 01/08/2015   Arthritis    Chronic back pain    Constipation    Cough    Diabetes mellitus, type 2 ([place])    Diverticulosis    Fibroid    patient thinks this was the reason for her hysterectomy   GERD (gastroesophageal reflux disease)    Heart murmur    History of sebaceous cyst    Hyperlipemia    Hyperplastic colon polyp    Hypertension    Hyperthyroidism    Lumbar radiculopathy    Shortness of breath 09/13/2013   Stroke ([place]) 2015?   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 Zigmund Daniel     Family History  Problem Relation Age of Onset   Hypertension Mother    Sleep apnea Mother    Diabetes Mother    Hyperlipidemia Mother    Lung cancer Father        smoker   Hypertension Sister    Diabetes Sister    Hypertension Brother    Hypertension Sister    Diabetes Brother    Hypertension Brother    Breast cancer Neg Hx    Colon cancer Neg Hx    Esophageal cancer Neg Hx    Pancreatic cancer Neg Hx    Liver disease Neg Hx    Colon polyps Neg Hx     Social History   Socioeconomic History   Marital status: Divorced    Spouse name: Not on file   Number of children: 1   Years of education: 11   Highest education level: Not on file  Occupational History   Occupation: Insurance account manager: [employer]    Comment: Retired  Tobacco Use   Smoking status: Former    Packs/day: 0.50    Years: 35.00    Pack years: 17.50    Types: Cigarettes    Quit date: 07/07/2014    Years since quitting: 6.0   Smokeless tobacco: Never  Vaping Use   Vaping Use: Never used  Substance and Sexual Activity   Alcohol use: No  Drug use: No   Sexual activity: Never    Partners: Male    Birth control/protection: Surgical    Comment: TAH--?ovaries remain  Other Topics Concern   Not on file  Social History Narrative   Divorced 1 son    Retired Museum/gallery curator for [employer]   Former smoker no alcohol caffeine or drug use report denies abuse and feels safe at home.   Social Determinants of Health   Financial Resource Strain: Medium Risk   Difficulty of Paying Living Expenses: Somewhat hard  Food Insecurity: Food Insecurity Present   Worried About Charity fundraiser in the Last Year: Often true   Arboriculturist in the Last Year: Sometimes true  Transportation Needs: No Transportation Needs   Lack of Transportation (Medical): No   Lack of Transportation (Non-Medical): No  Physical Activity: Sufficiently Active   Days of Exercise per Week: 5 days   Minutes  of Exercise per Session: 30 min  Stress: No Stress Concern Present   Feeling of Stress : Not at all  Social Connections: Not on file  Intimate Partner Violence: Not At Risk   Fear of Current or Ex-Partner: No   Emotionally Abused: No   Physically Abused: No   Sexually Abused: No    Allergies  Allergen Reactions   Semaglutide Rash    Rybelsus 3 mg    Current Outpatient Medications  Medication Sig Dispense Refill   acarbose (PRECOSE) 25 MG tablet Take 0.5 tablets (12.5 mg total) by mouth 3 (three) times daily with meals. 135 tablet 1   acetaminophen (TYLENOL) 500 MG tablet Take 1,000 mg by mouth every 6 (six) hours as needed for moderate pain.     allopurinol (ZYLOPRIM) 100 MG tablet Take 1 tablet (100 mg total) by mouth daily. 30 tablet 0   amLODipine (NORVASC) 10 MG tablet TAKE 1 TABLET BY MOUTH EVERY DAY (Patient taking differently: Take 10 mg by mouth daily.) 90 tablet 0   atorvastatin (LIPITOR) 80 MG tablet TAKE 1 TABLET BY MOUTH EVERY DAY (Patient taking differently: Take 80 mg by mouth daily.) 90 tablet 1   Blood Glucose Monitoring Suppl (ONETOUCH VERIO FLEX SYSTEM) w/Device KIT 1 each by Does not apply route 2 (two) times daily. E11.9     bromocriptine (PARLODEL) 2.5 MG tablet Take 0.5 tablets (1.25 mg total) by mouth daily. 45 tablet 1   epoetin alfa-epbx (RETACRIT) 86578 UNIT/ML injection Inject 40,000 Units into the skin every 28 (twenty-eight) days.     glucose blood (ONETOUCH VERIO) test strip USE AS DIRECTED TWICE DAILY 200 strip 3   hydrALAZINE (APRESOLINE) 50 MG tablet Take 1 tablet (50 mg total) by mouth every 8 (eight) hours. 90 tablet 0   isosorbide mononitrate (IMDUR) 30 MG 24 hr tablet Take 1 tablet (30 mg total) by mouth daily. 30 tablet 0   metoprolol succinate (TOPROL-XL) 100 MG 24 hr tablet Take 100 mg by mouth daily.     minoxidil (LONITEN) 10 MG tablet Take 10 mg by mouth 2 (two) times daily.     OneTouch Delica Lancets 46N MISC 1 each by Does not apply route  2 (two) times daily. E11.9     pantoprazole (PROTONIX) 40 MG tablet Take 1 tablet (40 mg total) by mouth daily. 90 tablet 3   polyethylene glycol (MIRALAX / GLYCOLAX) 17 g packet Take 34-51 g by mouth daily as needed for mild constipation.     repaglinide (PRANDIN) 2 MG tablet Take 1 tablet (  2 mg total) by mouth 2 (two) times daily before a meal. 180 tablet 3   torsemide (DEMADEX) 20 MG tablet Take 4 tablets (80 mg total) by mouth 2 (two) times daily. 240 tablet 0   No current facility-administered medications for this visit.    REVIEW OF SYSTEMS:  [X] denotes positive finding, [ ] denotes negative finding Cardiac  Comments:  Chest pain or chest pressure:    Shortness of breath upon exertion:    Short of breath when lying flat:    Irregular heart rhythm:        Vascular    Pain in calf, thigh, or hip brought on by ambulation:    Pain in feet at night that wakes you up from your sleep:     Blood clot in your veins:    Leg swelling:         Pulmonary    Oxygen at home:    Productive cough:     Wheezing:         Neurologic    Sudden weakness in arms or legs:     Sudden numbness in arms or legs:     Sudden onset of difficulty speaking or slurred speech:    Temporary loss of vision in one eye:     Problems with dizziness:         Gastrointestinal    Blood in stool:     Vomited blood:         Genitourinary    Burning when urinating:     Blood in urine:        Psychiatric    Major depression:         Hematologic    Bleeding problems:    Problems with blood clotting too easily:        Skin    Rashes or ulcers:        Constitutional    Fever or chills:      PHYSICAL EXAM Vitals:   07/07/20 1514  BP: (!) 122/58  Pulse: 65  Resp: 20  Temp: 99 F (37.2 C)  SpO2: 98%  Weight: 128 lb (58.1 kg)  Height: 5' 2" (1.575 m)    Constitutional: well appearing. no distress. Appears well nourished.  Neurologic: CN intact. no focal findings. no sensory  loss. Psychiatric: Mood and affect symmetric and appropriate. Eyes: No icterus. No conjunctival pallor. Ears, nose, throat: mucous membranes moist. Midline trachea.  Cardiac: regular rate and rhythm.  Respiratory: unlabored. Abdominal: soft, non-tender, non-distended.  Peripheral vascular: 2+ radial pulses Extremity: no edema. no cyanosis. no pallor.  Skin: no gangrene. no ulceration.  Lymphatic: no Stemmer's sign. no palpable lymphadenopathy.  PERTINENT LABORATORY AND RADIOLOGIC DATA  Most recent CBC CBC Latest Ref Rng & Units 06/30/2020 06/29/2020 06/28/2020  WBC 4.0 - 10.5 K/uL 7.4 6.5 7.5  Hemoglobin 12.0 - 15.0 g/dL 8.1(L) 9.3(L) 9.4(L)  Hematocrit 36.0 - 46.0 % 24.3(L) 28.0(L) 27.7(L)  Platelets 150 - 400 K/uL 221 230 220     Most recent CMP CMP Latest Ref Rng & Units 06/30/2020 06/29/2020 06/28/2020  Glucose 70 - 99 mg/dL 74 107(H) 89  BUN 8 - 23 mg/dL 91(H) 92(H) 93(H)  Creatinine 0.44 - 1.00 mg/dL 3.94(H) 4.16(H) 4.15(H)  Sodium 135 - 145 mmol/L 133(L) 138 137  Potassium 3.5 - 5.1 mmol/L 4.5 3.4(L) 3.7  Chloride 98 - 111 mmol/L 98 101 100  CO2 22 - 32 mmol/L _0 Calcium 8.9 - 10.3 mg/dL 8.7(L)  9.0 8.8(L)  Total Protein 6.5 - 8.1 g/dL 6.9 - -  Total Bilirubin 0.3 - 1.2 mg/dL 1.4(H) - -  Alkaline Phos 38 - 126 U/L 63 - -  AST 15 - 41 U/L 20 - -  ALT 0 - 44 U/L 19 - -    Renal function Estimated Creatinine Clearance: 10.5 mL/min (A) (by C-G formula based on SCr of 3.94 mg/dL (H)).  Hemoglobin A1C (%)  Date Value  06/24/2020 6.0 (A)   Hgb A1c MFr Bld (%)  Date Value  06/03/2020 6.2 (H)    LDL Cholesterol  Date Value Ref Range Status  07/11/2018 80 0 - 99 mg/dL Final   Direct LDL  Date Value Ref Range Status  07/10/2017 79.0 mg/dL Final    Comment:    Optimal:  <100 mg/dLNear or Above Optimal:  100-129 mg/dLBorderline High:  130-159 mg/dLHigh:  160-189 mg/dLVery High:  >190 mg/dL     Vascular Imaging: Vein mapping shows adequate cephalic vein for  brachiocephalic fistula on either arm.  Yevonne Aline. Stanford Breed, MD [facility] Phone Number: [phone] 07/05/2020 9:52 PM

## 2020-07-06 DIAGNOSIS — D631 Anemia in chronic kidney disease: Secondary | ICD-10-CM | POA: Diagnosis not present

## 2020-07-06 DIAGNOSIS — E1165 Type 2 diabetes mellitus with hyperglycemia: Secondary | ICD-10-CM | POA: Diagnosis not present

## 2020-07-06 DIAGNOSIS — M1712 Unilateral primary osteoarthritis, left knee: Secondary | ICD-10-CM | POA: Diagnosis not present

## 2020-07-06 DIAGNOSIS — G8929 Other chronic pain: Secondary | ICD-10-CM | POA: Diagnosis not present

## 2020-07-06 DIAGNOSIS — K5900 Constipation, unspecified: Secondary | ICD-10-CM | POA: Diagnosis not present

## 2020-07-06 DIAGNOSIS — I129 Hypertensive chronic kidney disease with stage 1 through stage 4 chronic kidney disease, or unspecified chronic kidney disease: Secondary | ICD-10-CM | POA: Diagnosis not present

## 2020-07-06 DIAGNOSIS — M5416 Radiculopathy, lumbar region: Secondary | ICD-10-CM | POA: Diagnosis not present

## 2020-07-06 DIAGNOSIS — E785 Hyperlipidemia, unspecified: Secondary | ICD-10-CM | POA: Diagnosis not present

## 2020-07-06 DIAGNOSIS — N179 Acute kidney failure, unspecified: Secondary | ICD-10-CM | POA: Diagnosis not present

## 2020-07-06 DIAGNOSIS — E1122 Type 2 diabetes mellitus with diabetic chronic kidney disease: Secondary | ICD-10-CM | POA: Diagnosis not present

## 2020-07-06 DIAGNOSIS — M109 Gout, unspecified: Secondary | ICD-10-CM | POA: Diagnosis not present

## 2020-07-06 DIAGNOSIS — N184 Chronic kidney disease, stage 4 (severe): Secondary | ICD-10-CM | POA: Diagnosis not present

## 2020-07-06 DIAGNOSIS — E875 Hyperkalemia: Secondary | ICD-10-CM | POA: Diagnosis not present

## 2020-07-07 ENCOUNTER — Ambulatory Visit (INDEPENDENT_AMBULATORY_CARE_PROVIDER_SITE_OTHER): Payer: Medicare Other | Admitting: Vascular Surgery

## 2020-07-07 ENCOUNTER — Other Ambulatory Visit: Payer: Self-pay

## 2020-07-07 ENCOUNTER — Ambulatory Visit (INDEPENDENT_AMBULATORY_CARE_PROVIDER_SITE_OTHER)
Admission: RE | Admit: 2020-07-07 | Discharge: 2020-07-07 | Disposition: A | Discharge status: Home / Self Care | Payer: Medicare Other | Source: Ambulatory Visit | Attending: Vascular Surgery | Admitting: Vascular Surgery

## 2020-07-07 ENCOUNTER — Encounter: Payer: Self-pay | Admitting: Vascular Surgery

## 2020-07-07 ENCOUNTER — Ambulatory Visit (HOSPITAL_COMMUNITY)
Admission: RE | Admit: 2020-07-07 | Discharge: 2020-07-07 | Disposition: A | Discharge status: Home / Self Care | Payer: Medicare Other | Source: Ambulatory Visit | Attending: Vascular Surgery | Admitting: Vascular Surgery

## 2020-07-07 VITALS — BP 122/58 | HR 65 | Temp 99.0°F | Resp 20 | Ht 62.0 in | Wt 128.0 lb

## 2020-07-07 DIAGNOSIS — N184 Chronic kidney disease, stage 4 (severe): Secondary | ICD-10-CM

## 2020-07-07 DIAGNOSIS — E785 Hyperlipidemia, unspecified: Secondary | ICD-10-CM | POA: Diagnosis not present

## 2020-07-07 DIAGNOSIS — E1122 Type 2 diabetes mellitus with diabetic chronic kidney disease: Secondary | ICD-10-CM | POA: Diagnosis not present

## 2020-07-07 DIAGNOSIS — M109 Gout, unspecified: Secondary | ICD-10-CM | POA: Diagnosis not present

## 2020-07-07 DIAGNOSIS — K5900 Constipation, unspecified: Secondary | ICD-10-CM | POA: Diagnosis not present

## 2020-07-07 DIAGNOSIS — N179 Acute kidney failure, unspecified: Secondary | ICD-10-CM | POA: Diagnosis not present

## 2020-07-07 DIAGNOSIS — E1165 Type 2 diabetes mellitus with hyperglycemia: Secondary | ICD-10-CM | POA: Diagnosis not present

## 2020-07-07 DIAGNOSIS — M5416 Radiculopathy, lumbar region: Secondary | ICD-10-CM | POA: Diagnosis not present

## 2020-07-07 DIAGNOSIS — E875 Hyperkalemia: Secondary | ICD-10-CM | POA: Diagnosis not present

## 2020-07-07 DIAGNOSIS — M1712 Unilateral primary osteoarthritis, left knee: Secondary | ICD-10-CM | POA: Diagnosis not present

## 2020-07-07 DIAGNOSIS — G8929 Other chronic pain: Secondary | ICD-10-CM | POA: Diagnosis not present

## 2020-07-07 DIAGNOSIS — D631 Anemia in chronic kidney disease: Secondary | ICD-10-CM | POA: Diagnosis not present

## 2020-07-07 DIAGNOSIS — I129 Hypertensive chronic kidney disease with stage 1 through stage 4 chronic kidney disease, or unspecified chronic kidney disease: Secondary | ICD-10-CM | POA: Diagnosis not present

## 2020-07-08 DIAGNOSIS — G8929 Other chronic pain: Secondary | ICD-10-CM | POA: Diagnosis not present

## 2020-07-08 DIAGNOSIS — N185 Chronic kidney disease, stage 5: Secondary | ICD-10-CM | POA: Diagnosis not present

## 2020-07-08 DIAGNOSIS — E875 Hyperkalemia: Secondary | ICD-10-CM | POA: Diagnosis not present

## 2020-07-08 DIAGNOSIS — N1832 Chronic kidney disease, stage 3b: Secondary | ICD-10-CM | POA: Diagnosis not present

## 2020-07-08 DIAGNOSIS — K5900 Constipation, unspecified: Secondary | ICD-10-CM | POA: Diagnosis not present

## 2020-07-08 DIAGNOSIS — K5520 Angiodysplasia of colon without hemorrhage: Secondary | ICD-10-CM

## 2020-07-08 DIAGNOSIS — N184 Chronic kidney disease, stage 4 (severe): Secondary | ICD-10-CM | POA: Diagnosis not present

## 2020-07-08 DIAGNOSIS — E1122 Type 2 diabetes mellitus with diabetic chronic kidney disease: Secondary | ICD-10-CM | POA: Diagnosis not present

## 2020-07-08 DIAGNOSIS — N179 Acute kidney failure, unspecified: Secondary | ICD-10-CM | POA: Diagnosis not present

## 2020-07-08 DIAGNOSIS — M1712 Unilateral primary osteoarthritis, left knee: Secondary | ICD-10-CM | POA: Diagnosis not present

## 2020-07-08 DIAGNOSIS — D631 Anemia in chronic kidney disease: Secondary | ICD-10-CM | POA: Diagnosis not present

## 2020-07-08 DIAGNOSIS — I129 Hypertensive chronic kidney disease with stage 1 through stage 4 chronic kidney disease, or unspecified chronic kidney disease: Secondary | ICD-10-CM | POA: Diagnosis not present

## 2020-07-08 DIAGNOSIS — E785 Hyperlipidemia, unspecified: Secondary | ICD-10-CM | POA: Diagnosis not present

## 2020-07-08 DIAGNOSIS — M109 Gout, unspecified: Secondary | ICD-10-CM | POA: Diagnosis not present

## 2020-07-08 DIAGNOSIS — M5416 Radiculopathy, lumbar region: Secondary | ICD-10-CM | POA: Diagnosis not present

## 2020-07-08 DIAGNOSIS — E1165 Type 2 diabetes mellitus with hyperglycemia: Secondary | ICD-10-CM | POA: Diagnosis not present

## 2020-07-08 DIAGNOSIS — D649 Anemia, unspecified: Secondary | ICD-10-CM | POA: Diagnosis not present

## 2020-07-09 ENCOUNTER — Other Ambulatory Visit: Payer: Self-pay | Admitting: Family

## 2020-07-09 ENCOUNTER — Other Ambulatory Visit: Payer: Self-pay | Admitting: Endocrinology

## 2020-07-09 DIAGNOSIS — M1712 Unilateral primary osteoarthritis, left knee: Secondary | ICD-10-CM | POA: Diagnosis not present

## 2020-07-09 DIAGNOSIS — E1122 Type 2 diabetes mellitus with diabetic chronic kidney disease: Secondary | ICD-10-CM | POA: Diagnosis not present

## 2020-07-09 DIAGNOSIS — E785 Hyperlipidemia, unspecified: Secondary | ICD-10-CM | POA: Diagnosis not present

## 2020-07-09 DIAGNOSIS — E1165 Type 2 diabetes mellitus with hyperglycemia: Secondary | ICD-10-CM | POA: Diagnosis not present

## 2020-07-09 DIAGNOSIS — D631 Anemia in chronic kidney disease: Secondary | ICD-10-CM | POA: Diagnosis not present

## 2020-07-09 DIAGNOSIS — I129 Hypertensive chronic kidney disease with stage 1 through stage 4 chronic kidney disease, or unspecified chronic kidney disease: Secondary | ICD-10-CM | POA: Diagnosis not present

## 2020-07-09 DIAGNOSIS — K5900 Constipation, unspecified: Secondary | ICD-10-CM | POA: Diagnosis not present

## 2020-07-09 DIAGNOSIS — G8929 Other chronic pain: Secondary | ICD-10-CM | POA: Diagnosis not present

## 2020-07-09 DIAGNOSIS — E875 Hyperkalemia: Secondary | ICD-10-CM | POA: Diagnosis not present

## 2020-07-09 DIAGNOSIS — N184 Chronic kidney disease, stage 4 (severe): Secondary | ICD-10-CM | POA: Diagnosis not present

## 2020-07-09 DIAGNOSIS — M109 Gout, unspecified: Secondary | ICD-10-CM | POA: Diagnosis not present

## 2020-07-09 DIAGNOSIS — M5416 Radiculopathy, lumbar region: Secondary | ICD-10-CM | POA: Diagnosis not present

## 2020-07-09 DIAGNOSIS — N179 Acute kidney failure, unspecified: Secondary | ICD-10-CM | POA: Diagnosis not present

## 2020-07-10 ENCOUNTER — Telehealth: Payer: Self-pay

## 2020-07-10 ENCOUNTER — Inpatient Hospital Stay: Payer: Medicare Other

## 2020-07-10 ENCOUNTER — Inpatient Hospital Stay: Payer: Medicare Other | Admitting: Hematology and Oncology

## 2020-07-10 NOTE — Telephone Encounter (Signed)
Spoke with the patient regarding scheduled appointment with Dr. Chryl Heck today. Patient stated that she had forgotten about today's appointment and would need to reschedule.  Scheduling message sent. Dr. Chryl Heck aware.

## 2020-07-13 DIAGNOSIS — D631 Anemia in chronic kidney disease: Secondary | ICD-10-CM | POA: Diagnosis not present

## 2020-07-13 DIAGNOSIS — E1122 Type 2 diabetes mellitus with diabetic chronic kidney disease: Secondary | ICD-10-CM | POA: Diagnosis not present

## 2020-07-13 DIAGNOSIS — G8929 Other chronic pain: Secondary | ICD-10-CM | POA: Diagnosis not present

## 2020-07-13 DIAGNOSIS — N179 Acute kidney failure, unspecified: Secondary | ICD-10-CM | POA: Diagnosis not present

## 2020-07-13 DIAGNOSIS — E1165 Type 2 diabetes mellitus with hyperglycemia: Secondary | ICD-10-CM | POA: Diagnosis not present

## 2020-07-13 DIAGNOSIS — E785 Hyperlipidemia, unspecified: Secondary | ICD-10-CM | POA: Diagnosis not present

## 2020-07-13 DIAGNOSIS — M1712 Unilateral primary osteoarthritis, left knee: Secondary | ICD-10-CM | POA: Diagnosis not present

## 2020-07-13 DIAGNOSIS — M5416 Radiculopathy, lumbar region: Secondary | ICD-10-CM | POA: Diagnosis not present

## 2020-07-13 DIAGNOSIS — K5900 Constipation, unspecified: Secondary | ICD-10-CM | POA: Diagnosis not present

## 2020-07-13 DIAGNOSIS — E875 Hyperkalemia: Secondary | ICD-10-CM | POA: Diagnosis not present

## 2020-07-13 DIAGNOSIS — M109 Gout, unspecified: Secondary | ICD-10-CM | POA: Diagnosis not present

## 2020-07-13 DIAGNOSIS — N184 Chronic kidney disease, stage 4 (severe): Secondary | ICD-10-CM | POA: Diagnosis not present

## 2020-07-13 DIAGNOSIS — I129 Hypertensive chronic kidney disease with stage 1 through stage 4 chronic kidney disease, or unspecified chronic kidney disease: Secondary | ICD-10-CM | POA: Diagnosis not present

## 2020-07-15 ENCOUNTER — Other Ambulatory Visit: Payer: Self-pay

## 2020-07-15 ENCOUNTER — Encounter (HOSPITAL_COMMUNITY): Payer: Self-pay | Admitting: Vascular Surgery

## 2020-07-15 DIAGNOSIS — M109 Gout, unspecified: Secondary | ICD-10-CM | POA: Diagnosis not present

## 2020-07-15 DIAGNOSIS — I129 Hypertensive chronic kidney disease with stage 1 through stage 4 chronic kidney disease, or unspecified chronic kidney disease: Secondary | ICD-10-CM | POA: Diagnosis not present

## 2020-07-15 DIAGNOSIS — D631 Anemia in chronic kidney disease: Secondary | ICD-10-CM | POA: Diagnosis not present

## 2020-07-15 DIAGNOSIS — E875 Hyperkalemia: Secondary | ICD-10-CM | POA: Diagnosis not present

## 2020-07-15 DIAGNOSIS — N183 Chronic kidney disease, stage 3 unspecified: Secondary | ICD-10-CM | POA: Diagnosis not present

## 2020-07-15 DIAGNOSIS — E1165 Type 2 diabetes mellitus with hyperglycemia: Secondary | ICD-10-CM | POA: Diagnosis not present

## 2020-07-15 DIAGNOSIS — M1A3620 Chronic gout due to renal impairment, left knee, without tophus (tophi): Secondary | ICD-10-CM | POA: Diagnosis not present

## 2020-07-15 DIAGNOSIS — N179 Acute kidney failure, unspecified: Secondary | ICD-10-CM | POA: Diagnosis not present

## 2020-07-15 DIAGNOSIS — M5416 Radiculopathy, lumbar region: Secondary | ICD-10-CM | POA: Diagnosis not present

## 2020-07-15 DIAGNOSIS — N184 Chronic kidney disease, stage 4 (severe): Secondary | ICD-10-CM | POA: Diagnosis not present

## 2020-07-15 DIAGNOSIS — G8929 Other chronic pain: Secondary | ICD-10-CM | POA: Diagnosis not present

## 2020-07-15 DIAGNOSIS — N185 Chronic kidney disease, stage 5: Secondary | ICD-10-CM | POA: Diagnosis not present

## 2020-07-15 DIAGNOSIS — E785 Hyperlipidemia, unspecified: Secondary | ICD-10-CM | POA: Diagnosis not present

## 2020-07-15 DIAGNOSIS — K5900 Constipation, unspecified: Secondary | ICD-10-CM | POA: Diagnosis not present

## 2020-07-15 DIAGNOSIS — M1712 Unilateral primary osteoarthritis, left knee: Secondary | ICD-10-CM | POA: Diagnosis not present

## 2020-07-15 DIAGNOSIS — E1122 Type 2 diabetes mellitus with diabetic chronic kidney disease: Secondary | ICD-10-CM | POA: Diagnosis not present

## 2020-07-15 DIAGNOSIS — R801 Persistent proteinuria, unspecified: Secondary | ICD-10-CM | POA: Insufficient documentation

## 2020-07-15 DIAGNOSIS — I120 Hypertensive chronic kidney disease with stage 5 chronic kidney disease or end stage renal disease: Secondary | ICD-10-CM | POA: Diagnosis not present

## 2020-07-15 NOTE — Progress Notes (Signed)
PCP - Erby Pian, PA-C Cardiologist - Dr. Debara Pickett EKG - 07/01/20 Chest x-ray - 06/30/20 ECHO - 06/03/20 Cardiac Cath -  CPAP -   Fasting Blood Sugar:  100s Checks Blood Sugar:  1x/day  COVID TEST- n/a  Anesthesia review: yes - cardiac hx  -------------  SDW INSTRUCTIONS:  Your procedure is scheduled on 6/23. Please report to [facility] Entrance "A" at 09:50 A.M., and check in at the Admitting office. Call this number if you have problems the morning of surgery: [phone]   Remember: Do not eat or drink after midnight the night before your surgery   Medications to take morning of surgery with a sip of water include: acetaminophen (TYLENOL) if needed allopurinol (ZYLOPRIM) amLODipine (NORVASC) atorvastatin (LIPITOR) hydrALAZINE (APRESOLINE) isosorbide mononitrate (IMDUR)  metoprolol succinate (TOPROL-XL) minoxidil (LONITEN) pantoprazole (PROTONIX)   ** PLEASE check your blood sugar the morning of your surgery when you wake up and every 2 hours until you get to the Short Stay unit.  If your blood sugar is less than 70 mg/dL, you will need to treat for low blood sugar: Do not take insulin. Treat a low blood sugar (less than 70 mg/dL) with  cup of clear juice (cranberry or apple), 4 glucose tablets, OR glucose gel. Recheck blood sugar in 15 minutes after treatment (to make sure it is greater than 70 mg/dL). If your blood sugar is not greater than 70 mg/dL on recheck, call [phone] for further instructions.  Do not take any oral diabetic medications DOS  As of today, STOP taking any Aspirin (unless otherwise instructed by your surgeon), Aleve, Naproxen, Ibuprofen, Motrin, Advil, Goody's, BC's, all herbal medications, fish oil, and all vitamins.    The Morning of Surgery Do not wear jewelry, make-up or nail polish. Do not wear lotions, powders, or perfumes, or deodorant Do not shave 48 hours prior to surgery.   Do not bring valuables to the hospital. [facility] is not responsible for any belongings or valuables.  If you are a smoker, DO NOT Smoke 24 hours prior to surgery If you wear a CPAP at night please bring your mask the morning of surgery  Remember that you must have someone to transport you home after your surgery, and remain with you for 24 hours if you are discharged the same day.  Please bring cases for contacts, glasses, hearing aids, dentures or bridgework because it cannot be worn into surgery.   Patients discharged the day of surgery will not be allowed to drive home.   Please shower the NIGHT BEFORE/MORNING OF SURGERY (use antibacterial soap like DIAL soap if possible). Wear comfortable clothes the morning of surgery. Oral Hygiene is also important to reduce your risk of infection.  Remember - BRUSH YOUR TEETH THE MORNING OF SURGERY WITH YOUR REGULAR TOOTHPASTE  Patient denies shortness of breath, fever, cough and chest pain.

## 2020-07-16 ENCOUNTER — Ambulatory Visit (HOSPITAL_COMMUNITY)
Admission: RE | Admit: 2020-07-16 | Discharge: 2020-07-16 | Disposition: A | Discharge status: Home / Self Care | Payer: Medicare Other | Attending: Vascular Surgery | Admitting: Vascular Surgery

## 2020-07-16 ENCOUNTER — Encounter (HOSPITAL_COMMUNITY): Payer: Self-pay | Admitting: Vascular Surgery

## 2020-07-16 ENCOUNTER — Ambulatory Visit (HOSPITAL_COMMUNITY): Payer: Medicare Other | Admitting: Anesthesiology

## 2020-07-16 ENCOUNTER — Encounter (HOSPITAL_COMMUNITY)
Admission: RE | Disposition: A | Discharge status: Home / Self Care | Payer: Self-pay | Source: Home / Self Care | Attending: Vascular Surgery

## 2020-07-16 DIAGNOSIS — E1122 Type 2 diabetes mellitus with diabetic chronic kidney disease: Secondary | ICD-10-CM | POA: Insufficient documentation

## 2020-07-16 DIAGNOSIS — N184 Chronic kidney disease, stage 4 (severe): Secondary | ICD-10-CM

## 2020-07-16 DIAGNOSIS — Z8249 Family history of ischemic heart disease and other diseases of the circulatory system: Secondary | ICD-10-CM | POA: Diagnosis not present

## 2020-07-16 DIAGNOSIS — Z833 Family history of diabetes mellitus: Secondary | ICD-10-CM | POA: Diagnosis not present

## 2020-07-16 DIAGNOSIS — Z7984 Long term (current) use of oral hypoglycemic drugs: Secondary | ICD-10-CM | POA: Insufficient documentation

## 2020-07-16 DIAGNOSIS — Z5941 Food insecurity: Secondary | ICD-10-CM | POA: Insufficient documentation

## 2020-07-16 DIAGNOSIS — I132 Hypertensive heart and chronic kidney disease with heart failure and with stage 5 chronic kidney disease, or end stage renal disease: Secondary | ICD-10-CM | POA: Diagnosis not present

## 2020-07-16 DIAGNOSIS — N185 Chronic kidney disease, stage 5: Secondary | ICD-10-CM | POA: Insufficient documentation

## 2020-07-16 DIAGNOSIS — D631 Anemia in chronic kidney disease: Secondary | ICD-10-CM | POA: Diagnosis not present

## 2020-07-16 DIAGNOSIS — Z79899 Other long term (current) drug therapy: Secondary | ICD-10-CM | POA: Insufficient documentation

## 2020-07-16 DIAGNOSIS — Z87891 Personal history of nicotine dependence: Secondary | ICD-10-CM | POA: Diagnosis not present

## 2020-07-16 DIAGNOSIS — I5033 Acute on chronic diastolic (congestive) heart failure: Secondary | ICD-10-CM | POA: Diagnosis not present

## 2020-07-16 DIAGNOSIS — Z888 Allergy status to other drugs, medicaments and biological substances status: Secondary | ICD-10-CM | POA: Insufficient documentation

## 2020-07-16 DIAGNOSIS — I120 Hypertensive chronic kidney disease with stage 5 chronic kidney disease or end stage renal disease: Secondary | ICD-10-CM | POA: Diagnosis not present

## 2020-07-16 DIAGNOSIS — N186 End stage renal disease: Secondary | ICD-10-CM | POA: Diagnosis not present

## 2020-07-16 DIAGNOSIS — Z992 Dependence on renal dialysis: Secondary | ICD-10-CM | POA: Diagnosis not present

## 2020-07-16 HISTORY — PX: AV FISTULA PLACEMENT: SHX1204

## 2020-07-16 LAB — GLUCOSE, CAPILLARY
Glucose-Capillary: 64 mg/dL — ABNORMAL LOW (ref 70–99)
Glucose-Capillary: 116 mg/dL — ABNORMAL HIGH (ref 70–99)
Glucose-Capillary: 85 mg/dL (ref 70–99)

## 2020-07-16 LAB — POCT I-STAT, CHEM 8
BUN: 87 mg/dL — ABNORMAL HIGH (ref 8–23)
Calcium, Ion: 1.02 mmol/L — ABNORMAL LOW (ref 1.15–1.40)
Chloride: 104 mmol/L (ref 98–111)
Creatinine, Ser: 3.5 mg/dL — ABNORMAL HIGH (ref 0.44–1.00)
Glucose, Bld: 63 mg/dL — ABNORMAL LOW (ref 70–99)
HCT: 27 % — ABNORMAL LOW (ref 36.0–46.0)
Hemoglobin: 9.2 g/dL — ABNORMAL LOW (ref 12.0–15.0)
Potassium: 4.1 mmol/L (ref 3.5–5.1)
Sodium: 140 mmol/L (ref 135–145)
TCO2: 25 mmol/L (ref 22–32)

## 2020-07-16 SURGERY — ARTERIOVENOUS (AV) FISTULA CREATION
Anesthesia: Monitor Anesthesia Care | Site: Arm Lower | Laterality: Left

## 2020-07-16 MED ORDER — MIDAZOLAM HCL 2 MG/2ML IJ SOLN
INTRAMUSCULAR | Status: AC
  Administered 2020-07-16: 1 mg via INTRAVENOUS
  Filled 2020-07-16: qty 2

## 2020-07-16 MED ORDER — ONDANSETRON HCL 4 MG/2ML IJ SOLN: 4.0000 mg | Freq: Four times a day (QID) | INTRAMUSCULAR | Status: DC | PRN

## 2020-07-16 MED ORDER — OXYCODONE-ACETAMINOPHEN 5-325 MG PO TABS: 1.0000 | ORAL_TABLET | Freq: Four times a day (QID) | ORAL | 0 refills | Status: DC | PRN

## 2020-07-16 MED ORDER — CEFAZOLIN SODIUM-DEXTROSE 2-4 GM/100ML-% IV SOLN
2.0000 g | INTRAVENOUS | Status: AC
  Administered 2020-07-16: 2 g via INTRAVENOUS
  Filled 2020-07-16: qty 100

## 2020-07-16 MED ORDER — FENTANYL CITRATE (PF) 250 MCG/5ML IJ SOLN
INTRAMUSCULAR | Status: DC | PRN
  Administered 2020-07-16: 50 ug via INTRAVENOUS

## 2020-07-16 MED ORDER — OXYCODONE HCL 5 MG/5ML PO SOLN: 5.0000 mg | Freq: Once | ORAL | Status: DC | PRN

## 2020-07-16 MED ORDER — ROPIVACAINE HCL 5 MG/ML IJ SOLN
INTRAMUSCULAR | Status: DC | PRN
  Administered 2020-07-16: 25 mL via PERINEURAL

## 2020-07-16 MED ORDER — DEXTROSE 50 % IV SOLN
INTRAVENOUS | Status: AC
  Administered 2020-07-16: 25 mL via INTRAVENOUS
  Filled 2020-07-16: qty 50

## 2020-07-16 MED ORDER — DEXMEDETOMIDINE (PRECEDEX) IN NS 20 MCG/5ML (4 MCG/ML) IV SYRINGE
PREFILLED_SYRINGE | INTRAVENOUS | Status: DC | PRN
  Administered 2020-07-16: 4 ug via INTRAVENOUS

## 2020-07-16 MED ORDER — FENTANYL CITRATE (PF) 100 MCG/2ML IJ SOLN
INTRAMUSCULAR | Status: AC
  Administered 2020-07-16: 50 ug via INTRAVENOUS
  Filled 2020-07-16: qty 2

## 2020-07-16 MED ORDER — FENTANYL CITRATE (PF) 100 MCG/2ML IJ SOLN: 25.0000 ug | INTRAMUSCULAR | Status: DC | PRN

## 2020-07-16 MED ORDER — CHLORHEXIDINE GLUCONATE 4 % EX LIQD: 60.0000 mL | Freq: Once | CUTANEOUS | Status: DC

## 2020-07-16 MED ORDER — CHLORHEXIDINE GLUCONATE 0.12 % MT SOLN
OROMUCOSAL | Status: AC
  Administered 2020-07-16: 15 mL
  Filled 2020-07-16: qty 15

## 2020-07-16 MED ORDER — SODIUM CHLORIDE 0.9 % IV SOLN
INTRAVENOUS | Status: DC | PRN
  Administered 2020-07-16: 500 mL

## 2020-07-16 MED ORDER — PHENYLEPHRINE 40 MCG/ML (10ML) SYRINGE FOR IV PUSH (FOR BLOOD PRESSURE SUPPORT)
PREFILLED_SYRINGE | INTRAVENOUS | Status: DC | PRN
  Administered 2020-07-16: 80 ug via INTRAVENOUS
  Administered 2020-07-16: 60 ug via INTRAVENOUS
  Administered 2020-07-16: 80 ug via INTRAVENOUS

## 2020-07-16 MED ORDER — DEXTROSE 50 % IV SOLN: 25.0000 mL | Freq: Once | INTRAVENOUS | Status: AC

## 2020-07-16 MED ORDER — LIDOCAINE-EPINEPHRINE 1 %-1:100000 IJ SOLN
INTRAMUSCULAR | Status: AC
  Filled 2020-07-16: qty 1

## 2020-07-16 MED ORDER — LIDOCAINE 2% (20 MG/ML) 5 ML SYRINGE
INTRAMUSCULAR | Status: DC | PRN
  Administered 2020-07-16: 20 mg via INTRAVENOUS

## 2020-07-16 MED ORDER — LIDOCAINE HCL (PF) 1 % IJ SOLN
INTRAMUSCULAR | Status: DC | PRN
  Administered 2020-07-16: 5 mg

## 2020-07-16 MED ORDER — FENTANYL CITRATE (PF) 250 MCG/5ML IJ SOLN
INTRAMUSCULAR | Status: AC
  Filled 2020-07-16: qty 5

## 2020-07-16 MED ORDER — 0.9 % SODIUM CHLORIDE (POUR BTL) OPTIME
TOPICAL | Status: DC | PRN
  Administered 2020-07-16: 1000 mL

## 2020-07-16 MED ORDER — PROPOFOL 500 MG/50ML IV EMUL
INTRAVENOUS | Status: DC | PRN
  Administered 2020-07-16: 30 ug/kg/min via INTRAVENOUS

## 2020-07-16 MED ORDER — FENTANYL CITRATE (PF) 100 MCG/2ML IJ SOLN: 50.0000 ug | Freq: Once | INTRAMUSCULAR | Status: AC

## 2020-07-16 MED ORDER — OXYCODONE HCL 5 MG PO TABS: 5.0000 mg | ORAL_TABLET | Freq: Once | ORAL | Status: DC | PRN

## 2020-07-16 MED ORDER — SODIUM CHLORIDE 0.9 % IV SOLN
INTRAVENOUS | Status: AC
  Filled 2020-07-16: qty 1.2

## 2020-07-16 MED ORDER — MIDAZOLAM HCL 2 MG/2ML IJ SOLN: 1.0000 mg | Freq: Once | INTRAMUSCULAR | Status: AC

## 2020-07-16 MED ORDER — SODIUM CHLORIDE 0.9 % IV SOLN: INTRAVENOUS | Status: DC

## 2020-07-16 SURGICAL SUPPLY — 38 items
ADH SKN CLS APL DERMABOND .7 (GAUZE/BANDAGES/DRESSINGS) ×1
APL PRP STRL LF DISP 70% ISPRP (MISCELLANEOUS) ×1
APL SKNCLS STERI-STRIP NONHPOA (GAUZE/BANDAGES/DRESSINGS)
ARMBAND PINK RESTRICT EXTREMIT (MISCELLANEOUS) ×2 IMPLANT
BENZOIN TINCTURE PRP APPL 2/3 (GAUZE/BANDAGES/DRESSINGS) ×1 IMPLANT
CANISTER SUCT 3000ML PPV (MISCELLANEOUS) ×2 IMPLANT
CANNULA VESSEL 3MM 2 BLNT TIP (CANNULA) ×2 IMPLANT
CHLORAPREP W/TINT 26 (MISCELLANEOUS) ×2 IMPLANT
CLIP VESOCCLUDE MED 6/CT (CLIP) ×2 IMPLANT
CLIP VESOCCLUDE SM WIDE 6/CT (CLIP) ×2 IMPLANT
COVER PROBE W GEL 5X96 (DRAPES) ×1 IMPLANT
DERMABOND ADVANCED (GAUZE/BANDAGES/DRESSINGS) ×1
DERMABOND ADVANCED .7 DNX12 (GAUZE/BANDAGES/DRESSINGS) IMPLANT
DRAPE EXTREMITY T 121X128X90 (DISPOSABLE) ×2 IMPLANT
ELECT REM PT RETURN 9FT ADLT (ELECTROSURGICAL) ×2
ELECTRODE REM PT RTRN 9FT ADLT (ELECTROSURGICAL) ×1 IMPLANT
GLOVE SURG POLYISO LF SZ8 (GLOVE) ×2 IMPLANT
GOWN STRL REUS W/ TWL LRG LVL3 (GOWN DISPOSABLE) ×2 IMPLANT
GOWN STRL REUS W/ TWL XL LVL3 (GOWN DISPOSABLE) ×1 IMPLANT
GOWN STRL REUS W/TWL LRG LVL3 (GOWN DISPOSABLE) ×4
GOWN STRL REUS W/TWL XL LVL3 (GOWN DISPOSABLE) ×2
INSERT FOGARTY SM (MISCELLANEOUS) IMPLANT
KIT BASIN OR (CUSTOM PROCEDURE TRAY) ×2 IMPLANT
KIT TURNOVER KIT B (KITS) ×2 IMPLANT
NDL 18GX1X1/2 (RX/OR ONLY) (NEEDLE) IMPLANT
NEEDLE 18GX1X1/2 (RX/OR ONLY) (NEEDLE) IMPLANT
NS IRRIG 1000ML POUR BTL (IV SOLUTION) ×2 IMPLANT
PACK CV ACCESS (CUSTOM PROCEDURE TRAY) ×2 IMPLANT
PAD ARMBOARD 7.5X6 YLW CONV (MISCELLANEOUS) ×4 IMPLANT
STRIP CLOSURE SKIN 1/2X4 (GAUZE/BANDAGES/DRESSINGS) ×1 IMPLANT
SUT MNCRL AB 4-0 PS2 18 (SUTURE) ×2 IMPLANT
SUT PROLENE 6 0 BV (SUTURE) ×2 IMPLANT
SUT VIC AB 3-0 SH 27 (SUTURE) ×2
SUT VIC AB 3-0 SH 27X BRD (SUTURE) ×1 IMPLANT
SYR 3ML LL SCALE MARK (SYRINGE) IMPLANT
TOWEL GREEN STERILE (TOWEL DISPOSABLE) ×2 IMPLANT
UNDERPAD 30X36 HEAVY ABSORB (UNDERPADS AND DIAPERS) ×2 IMPLANT
WATER STERILE IRR 1000ML POUR (IV SOLUTION) ×2 IMPLANT

## 2020-07-16 NOTE — Anesthesia Procedure Notes (Signed)
Procedure Name: MAC Date/Time: 07/16/2020 11:03 AM Performed by: Mariea Clonts, CRNA Pre-anesthesia Checklist: Patient identified, Emergency Drugs available, Suction available, Patient being monitored and Timeout performed

## 2020-07-16 NOTE — Transfer of Care (Signed)
Immediate Anesthesia Transfer of Care Note  Patient: [name]  Procedure(s) Performed: LEFT BRACHIOCEPHALIC ARTERIOVENOUS (AV) FISTULA CREATION (Left: Arm Lower)  Patient Location: PACU  Anesthesia Type:MAC combined with regional for post-op pain  Level of Consciousness: awake, alert  and oriented  Airway & Oxygen Therapy: Patient Spontanous Breathing  Post-op Assessment: Report given to RN and Post -op Vital signs reviewed and stable  Post vital signs: Reviewed and stable  Last Vitals:  Vitals Value Taken Time  BP 127/52 07/16/20 1236  Temp    Pulse 70 07/16/20 1237  Resp 15 07/16/20 1237  SpO2 97 % 07/16/20 1237  Vitals shown include unvalidated device data.  Last Pain:  Vitals:   07/16/20 1033  TempSrc:   PainSc: 0-No pain         Complications: No notable events documented.

## 2020-07-16 NOTE — Discharge Instructions (Addendum)
   [facility]  Discharge Instructions  AV Fistula or Graft Surgery for Dialysis Access  Please refer to the following instructions for your post-procedure care. Your surgeon or physician assistant will discuss any changes with you.  Activity  You may drive the day following your surgery, if you are comfortable and no longer taking prescription pain medication. Resume full activity as the soreness in your incision resolves.  Bathing/Showering  You may shower after you go home. Keep your incision dry for 48 hours. Do not soak in a bathtub, hot tub, or swim until the incision heals completely. You may not shower if you have a hemodialysis catheter.  Incision Care  Clean your incision with mild soap and water after 48 hours. Pat the area dry with a clean towel. You do not need a bandage unless otherwise instructed. Do not apply any ointments or creams to your incision. You may have skin glue on your incision. Do not peel it off. It will come off on its own in about one week. Your arm may swell a bit after surgery. To reduce swelling use pillows to elevate your arm so it is above your heart. Your doctor will tell you if you need to lightly wrap your arm with an ACE bandage.  Diet  Resume your normal diet. There are not special food restrictions following this procedure. In order to heal from your surgery, it is CRITICAL to get adequate nutrition. Your body requires vitamins, minerals, and protein. Vegetables are the best source of vitamins and minerals. Vegetables also provide the perfect balance of protein. Processed food has little nutritional value, so try to avoid this.  Medications  Resume taking all of your medications. If your incision is causing pain, you may take over-the counter pain relievers such as acetaminophen (Tylenol). If you were prescribed a stronger pain medication, please be aware these medications can cause nausea and constipation. Prevent  nausea by taking the medication with a snack or meal. Avoid constipation by drinking plenty of fluids and eating foods with high amount of fiber, such as fruits, vegetables, and grains.  Do not take Tylenol if you are taking prescription pain medications.  Follow up Your surgeon may want to see you in the office following your access surgery. If so, this will be arranged at the time of your surgery.  Please call us immediately for any of the following conditions:  Increased pain, redness, drainage (pus) from your incision site Fever of 101 degrees or higher Severe or worsening pain at your incision site Hand pain or numbness.  Reduce your risk of vascular disease:  Stop smoking. If you would like help, call [facility] at 1-800-QUIT-NOW 947-290-9526) or [facility] [place] your cholesterol Maintain a desired weight Control your diabetes Keep your blood pressure down  Dialysis  It will take several weeks to several months for your new dialysis access to be ready for use. Your surgeon will determine when it is okay to use it. Your nephrologist will continue to direct your dialysis. You can continue to use your Permcath until your new access is ready for use.   07/16/2020 [name] [id number] 08-29-1950  Surgeon(s): Cherre Robins, MD  Procedure(s): LEFT BRACHIOCEPHALIC ARTERIOVENOUS (AV) FISTULA CREATION  x Do not stick fistula for 12 weeks    If you have any questions, please call the office at [phone].

## 2020-07-16 NOTE — Anesthesia Preprocedure Evaluation (Addendum)
Anesthesia Evaluation  Patient identified by MRN, date of birth, ID band Patient awake    Reviewed: Allergy & Precautions, H&P , NPO status , Patient's Chart, lab work & pertinent test results  Airway Mallampati: II  TM Distance: >3 FB Neck ROM: full    Dental  (+) Edentulous Upper, Dental Advisory Given, Missing   Pulmonary shortness of breath, former smoker,    breath sounds clear to auscultation       Cardiovascular hypertension (154/49 in prop), Pt. on medications + Peripheral Vascular Disease and +CHF   Rhythm:regular Rate:Normal     Neuro/Psych  Neuromuscular disease CVA    GI/Hepatic PUD, GERD  Controlled,  Endo/Other  diabetesHyperthyroidism   Renal/GU ESRF and DialysisRenal disease     Musculoskeletal  (+) Arthritis ,   Abdominal   Peds  Hematology   Anesthesia Other Findings   Reproductive/Obstetrics                            Anesthesia Physical Anesthesia Plan  ASA: 3  Anesthesia Plan: MAC and Regional   Post-op Pain Management:    Induction: Intravenous  PONV Risk Score and Plan: 2 and Propofol infusion, Ondansetron and Treatment may vary due to age or medical condition  Airway Management Planned: Simple Face Mask  Additional Equipment:   Intra-op Plan:   Post-operative Plan:   Informed Consent: I have reviewed the patients History and Physical, chart, labs and discussed the procedure including the risks, benefits and alternatives for the proposed anesthesia with the patient or authorized representative who has indicated his/her understanding and acceptance.     Dental advisory given  Plan Discussed with: CRNA, Anesthesiologist and Surgeon  Anesthesia Plan Comments:         Anesthesia Quick Evaluation

## 2020-07-16 NOTE — Interval H&P Note (Signed)
History and Physical Interval Note:  07/16/2020 10:42 AM  [name]  has presented today for surgery, with the diagnosis of CHRONIC KIDNEY DISEASE STAGE V.  The various methods of treatment have been discussed with the patient and family. After consideration of risks, benefits and other options for treatment, the patient has consented to  Procedure(s) with comments: LEFT BRACHIOCEPHALIC ARTERIOVENOUS (AV) FISTULA CREATION (Left) - PERIPHERAL NERVE BLOCK as a surgical intervention.  The patient's history has been reviewed, patient examined, no change in status, stable for surgery.  I have reviewed the patient's chart and labs.  Questions were answered to the patient's satisfaction.     Cherre Robins

## 2020-07-16 NOTE — Op Note (Signed)
DATE OF SERVICE: 07/16/2020  PATIENT:  [name]  69 y.o. female  PRE-OPERATIVE DIAGNOSIS:  CKD V nearing ESRD  POST-OPERATIVE DIAGNOSIS:  Same  PROCEDURE:   Left brachiocephalic arteriovenous fistula  SURGEON:  Surgeon(s) and Role:    * Cherre Robins, MD - Primary  ASSISTANT: Paulo Fruit, PA-C  An assistant was required to facilitate exposure and expedite the case.  ANESTHESIA:   regional and MAC  EBL: min  BLOOD ADMINISTERED:none  DRAINS: none   LOCAL MEDICATIONS USED:  NONE  SPECIMEN:  none  COUNTS: confirmed correct.  TOURNIQUET:  none  PATIENT DISPOSITION:  PACU - hemodynamically stable.   Delay start of Pharmacological VTE agent (>24hrs) due to surgical blood loss or risk of bleeding: no  INDICATION FOR PROCEDURE: [name] is a 70 y.o. female with CKDV. After careful discussion of risks, benefits, and alternatives the patient was offered AV fistula creation. We specifically discussed risk of steal syndrome. The patient understood and wished to proceed.  OPERATIVE FINDINGS: healthy brachial artery and cephalic vein.  DESCRIPTION OF PROCEDURE: After identification of the patient in the pre-operative holding area, the patient was transferred to the operating room. The patient was positioned supine on the operating room table. Anesthesia was induced. The left arm was prepped and draped in standard fashion. A surgical pause was performed confirming correct patient, procedure, and operative location.  Using intraoperative ultrasound, the course of the upper extremity superficial veins was mapped.  The cephalic vein appeared adequate for arteriovenous fistula creation.  The brachial artery was similarly mapped.  The artery appeared adequate for arterial venous creation. We ensured there was no anomalous arterial anatomy such as a high bifurcation.  A transverse incision was made in the left arm just distal to the antecubital crease.  Incision was  carried down through subcutaneous tissue until the cephalic vein was identified and skeletonized.  We continued our exposure through the aponeurosis of the biceps.  The brachial artery was encountered its usual position.  The artery was circumferentially exposed and encircled with Silastic Vesseloops.  Patient was systemically heparinized.  The distal cephalic vein was transected.  The distal stump of the cephalic vein was oversewn with a 2-0 silk suture.  The proximal vein was controlled with a bulldog clamp.  The brachial artery was clamped proximally medially.  An anterior arteriotomy was made with a 11 blade.  The arteriotomy was extended with Potts scissors.  Using a parachute technique the cephalic vein was anastomosed to the brachial arteriotomy in end-to-side fashion with continuous running suture of 6-0 Prolene.  Immediately prior to completion the anastomosis was flushed and de-aired.  The anastomosis was completed.  Hemostasis was assured.  The fistula was interrogated with Doppler. Audible bruit was heard throughout the course of the cephalic vein.  A radial artery signal was heard which augmented slightly with compression of the fistula.  Upon completion of the case instrument and sharps counts were confirmed correct. The patient was transferred to the PACU in good condition. I was present for all portions of the procedure.  Yevonne Aline. Stanford Breed, MD [facility] Phone Number: [phone] 07/16/2020 12:44 PM

## 2020-07-16 NOTE — Anesthesia Postprocedure Evaluation (Signed)
Anesthesia Post Note  Patient: [name]  Procedure(s) Performed: LEFT BRACHIOCEPHALIC ARTERIOVENOUS (AV) FISTULA CREATION (Left: Arm Lower)     Patient location during evaluation: PACU Anesthesia Type: Regional and MAC Level of consciousness: awake and alert Pain management: pain level controlled Vital Signs Assessment: post-procedure vital signs reviewed and stable Respiratory status: spontaneous breathing, nonlabored ventilation and respiratory function stable Cardiovascular status: blood pressure returned to baseline and stable Postop Assessment: no apparent nausea or vomiting Anesthetic complications: no   No notable events documented.  Last Vitals:  Vitals:   07/16/20 1235 07/16/20 1240  BP: (!) 127/52   Pulse: 71 69  Resp: 17 12  Temp: 36.9 C   SpO2: 97%     Last Pain:  Vitals:   07/16/20 1235  TempSrc:   PainSc: 0-No pain                 Pervis Hocking

## 2020-07-16 NOTE — Anesthesia Procedure Notes (Signed)
Anesthesia Regional Block: Supraclavicular block   Pre-Anesthetic Checklist: , timeout performed,  Correct Patient, Correct Site, Correct Laterality,  Correct Procedure, Correct Position, site marked,  Risks and benefits discussed,  Surgical consent,  Pre-op evaluation,  At surgeon's request and post-op pain management  Laterality: Left  Prep: Maximum Sterile Barrier Precautions used, chloraprep       Needles:  Injection technique: Single-shot  Needle Type: Echogenic Stimulator Needle     Needle Length: 9cm  Needle Gauge: 22     Additional Needles:   Procedures:,,,, ultrasound used (permanent image in chart),,    Narrative:  Start time: 07/16/2020 10:45 AM End time: 07/16/2020 10:50 AM Injection made incrementally with aspirations every 5 mL.  Performed by: Personally  Anesthesiologist: Pervis Hocking, DO  Additional Notes: Monitors applied. No increased pain on injection. No increased resistance to injection. Injection made in 5cc increments. Good needle visualization. Patient tolerated procedure well.

## 2020-07-16 NOTE — Progress Notes (Signed)
Patient is fully recovered. Discharge instructions reviewed with family. VS stable. Waiting for family to pick patient up. IV discontinued, patient up to chair and drinking coke.   Rowe Pavy, RN

## 2020-07-17 ENCOUNTER — Encounter (HOSPITAL_COMMUNITY): Payer: Self-pay | Admitting: Vascular Surgery

## 2020-07-17 ENCOUNTER — Telehealth: Payer: Self-pay | Admitting: Endocrinology

## 2020-07-17 DIAGNOSIS — G8929 Other chronic pain: Secondary | ICD-10-CM | POA: Diagnosis not present

## 2020-07-17 DIAGNOSIS — M109 Gout, unspecified: Secondary | ICD-10-CM | POA: Diagnosis not present

## 2020-07-17 DIAGNOSIS — M5416 Radiculopathy, lumbar region: Secondary | ICD-10-CM | POA: Diagnosis not present

## 2020-07-17 DIAGNOSIS — E1122 Type 2 diabetes mellitus with diabetic chronic kidney disease: Secondary | ICD-10-CM | POA: Diagnosis not present

## 2020-07-17 DIAGNOSIS — N179 Acute kidney failure, unspecified: Secondary | ICD-10-CM | POA: Diagnosis not present

## 2020-07-17 DIAGNOSIS — E875 Hyperkalemia: Secondary | ICD-10-CM | POA: Diagnosis not present

## 2020-07-17 DIAGNOSIS — E1165 Type 2 diabetes mellitus with hyperglycemia: Secondary | ICD-10-CM | POA: Diagnosis not present

## 2020-07-17 DIAGNOSIS — I129 Hypertensive chronic kidney disease with stage 1 through stage 4 chronic kidney disease, or unspecified chronic kidney disease: Secondary | ICD-10-CM | POA: Diagnosis not present

## 2020-07-17 DIAGNOSIS — K5900 Constipation, unspecified: Secondary | ICD-10-CM | POA: Diagnosis not present

## 2020-07-17 DIAGNOSIS — E785 Hyperlipidemia, unspecified: Secondary | ICD-10-CM | POA: Diagnosis not present

## 2020-07-17 DIAGNOSIS — N184 Chronic kidney disease, stage 4 (severe): Secondary | ICD-10-CM | POA: Diagnosis not present

## 2020-07-17 DIAGNOSIS — M1712 Unilateral primary osteoarthritis, left knee: Secondary | ICD-10-CM | POA: Diagnosis not present

## 2020-07-17 DIAGNOSIS — D631 Anemia in chronic kidney disease: Secondary | ICD-10-CM | POA: Diagnosis not present

## 2020-07-17 NOTE — Telephone Encounter (Signed)
Pt stated tha in the mornings her BS will run between 130/140. In the night before bed, she will take her medication and eat some crackers, applesauce and in the morning when she wakes it would have dropped in the 50's.

## 2020-07-17 NOTE — Telephone Encounter (Signed)
Stephany(Nurse)- [facility] is calling to report that pt's Blood sugar has been low in the mornings in the 40's several times a week for the past month.  Arvil Chaco would like a call back on regarding what to do for the patient. [phone]

## 2020-07-20 DIAGNOSIS — M109 Gout, unspecified: Secondary | ICD-10-CM | POA: Diagnosis not present

## 2020-07-20 DIAGNOSIS — M1712 Unilateral primary osteoarthritis, left knee: Secondary | ICD-10-CM | POA: Diagnosis not present

## 2020-07-20 DIAGNOSIS — E875 Hyperkalemia: Secondary | ICD-10-CM | POA: Diagnosis not present

## 2020-07-20 DIAGNOSIS — N184 Chronic kidney disease, stage 4 (severe): Secondary | ICD-10-CM | POA: Diagnosis not present

## 2020-07-20 DIAGNOSIS — D631 Anemia in chronic kidney disease: Secondary | ICD-10-CM | POA: Diagnosis not present

## 2020-07-20 DIAGNOSIS — E785 Hyperlipidemia, unspecified: Secondary | ICD-10-CM | POA: Diagnosis not present

## 2020-07-20 DIAGNOSIS — M5416 Radiculopathy, lumbar region: Secondary | ICD-10-CM | POA: Diagnosis not present

## 2020-07-20 DIAGNOSIS — I129 Hypertensive chronic kidney disease with stage 1 through stage 4 chronic kidney disease, or unspecified chronic kidney disease: Secondary | ICD-10-CM | POA: Diagnosis not present

## 2020-07-20 DIAGNOSIS — N179 Acute kidney failure, unspecified: Secondary | ICD-10-CM | POA: Diagnosis not present

## 2020-07-20 DIAGNOSIS — G8929 Other chronic pain: Secondary | ICD-10-CM | POA: Diagnosis not present

## 2020-07-20 DIAGNOSIS — E1165 Type 2 diabetes mellitus with hyperglycemia: Secondary | ICD-10-CM | POA: Diagnosis not present

## 2020-07-20 DIAGNOSIS — E1122 Type 2 diabetes mellitus with diabetic chronic kidney disease: Secondary | ICD-10-CM | POA: Diagnosis not present

## 2020-07-20 DIAGNOSIS — K5900 Constipation, unspecified: Secondary | ICD-10-CM | POA: Diagnosis not present

## 2020-07-21 DIAGNOSIS — N185 Chronic kidney disease, stage 5: Secondary | ICD-10-CM | POA: Diagnosis not present

## 2020-07-21 DIAGNOSIS — E1122 Type 2 diabetes mellitus with diabetic chronic kidney disease: Secondary | ICD-10-CM | POA: Diagnosis not present

## 2020-07-21 DIAGNOSIS — I120 Hypertensive chronic kidney disease with stage 5 chronic kidney disease or end stage renal disease: Secondary | ICD-10-CM | POA: Diagnosis not present

## 2020-07-21 DIAGNOSIS — D631 Anemia in chronic kidney disease: Secondary | ICD-10-CM | POA: Diagnosis not present

## 2020-07-21 DIAGNOSIS — R809 Proteinuria, unspecified: Secondary | ICD-10-CM | POA: Diagnosis not present

## 2020-07-21 DIAGNOSIS — N2581 Secondary hyperparathyroidism of renal origin: Secondary | ICD-10-CM | POA: Diagnosis not present

## 2020-07-21 DIAGNOSIS — I770 Arteriovenous fistula, acquired: Secondary | ICD-10-CM | POA: Diagnosis not present

## 2020-07-21 NOTE — Telephone Encounter (Signed)
Called [place] no answer. Left message to call us back in regards to medication change.

## 2020-07-22 DIAGNOSIS — N184 Chronic kidney disease, stage 4 (severe): Secondary | ICD-10-CM | POA: Diagnosis not present

## 2020-07-22 DIAGNOSIS — K5900 Constipation, unspecified: Secondary | ICD-10-CM | POA: Diagnosis not present

## 2020-07-22 DIAGNOSIS — G8929 Other chronic pain: Secondary | ICD-10-CM | POA: Diagnosis not present

## 2020-07-22 DIAGNOSIS — M1712 Unilateral primary osteoarthritis, left knee: Secondary | ICD-10-CM | POA: Diagnosis not present

## 2020-07-22 DIAGNOSIS — N179 Acute kidney failure, unspecified: Secondary | ICD-10-CM | POA: Diagnosis not present

## 2020-07-22 DIAGNOSIS — M5416 Radiculopathy, lumbar region: Secondary | ICD-10-CM | POA: Diagnosis not present

## 2020-07-22 DIAGNOSIS — E875 Hyperkalemia: Secondary | ICD-10-CM | POA: Diagnosis not present

## 2020-07-22 DIAGNOSIS — E785 Hyperlipidemia, unspecified: Secondary | ICD-10-CM | POA: Diagnosis not present

## 2020-07-22 DIAGNOSIS — I129 Hypertensive chronic kidney disease with stage 1 through stage 4 chronic kidney disease, or unspecified chronic kidney disease: Secondary | ICD-10-CM | POA: Diagnosis not present

## 2020-07-22 DIAGNOSIS — E1165 Type 2 diabetes mellitus with hyperglycemia: Secondary | ICD-10-CM | POA: Diagnosis not present

## 2020-07-22 DIAGNOSIS — M109 Gout, unspecified: Secondary | ICD-10-CM | POA: Diagnosis not present

## 2020-07-22 DIAGNOSIS — D631 Anemia in chronic kidney disease: Secondary | ICD-10-CM | POA: Diagnosis not present

## 2020-07-22 DIAGNOSIS — E1122 Type 2 diabetes mellitus with diabetic chronic kidney disease: Secondary | ICD-10-CM | POA: Diagnosis not present

## 2020-07-23 DIAGNOSIS — D500 Iron deficiency anemia secondary to blood loss (chronic): Secondary | ICD-10-CM | POA: Diagnosis not present

## 2020-07-24 ENCOUNTER — Other Ambulatory Visit: Payer: Self-pay

## 2020-07-24 ENCOUNTER — Encounter (HOSPITAL_COMMUNITY)
Admission: RE | Admit: 2020-07-24 | Discharge: 2020-07-24 | Disposition: A | Discharge status: Home / Self Care | Payer: Medicare Other | Source: Ambulatory Visit | Attending: Nephrology | Admitting: Nephrology

## 2020-07-24 VITALS — BP 143/56 | HR 72 | Resp 16

## 2020-07-24 DIAGNOSIS — N1831 Chronic kidney disease, stage 3a: Secondary | ICD-10-CM | POA: Diagnosis not present

## 2020-07-24 LAB — POCT HEMOGLOBIN-HEMACUE: Hemoglobin: 7.6 g/dL — ABNORMAL LOW (ref 12.0–15.0)

## 2020-07-24 MED ORDER — EPOETIN ALFA-EPBX 40000 UNIT/ML IJ SOLN
40000.0000 [IU] | INTRAMUSCULAR | Status: DC
  Administered 2020-07-24: 40000 [IU] via SUBCUTANEOUS

## 2020-07-24 MED ORDER — EPOETIN ALFA-EPBX 40000 UNIT/ML IJ SOLN
INTRAMUSCULAR | Status: AC
  Filled 2020-07-24: qty 1

## 2020-07-24 NOTE — Progress Notes (Signed)
Optometrist at [facility] of [facility] 7.6. Patient states she feels better and denies any signs of bleeding or SOB. Per [employer] we should give dose as ordered.

## 2020-07-26 ENCOUNTER — Other Ambulatory Visit: Payer: Self-pay | Admitting: Family

## 2020-07-28 DIAGNOSIS — K5900 Constipation, unspecified: Secondary | ICD-10-CM | POA: Diagnosis not present

## 2020-07-28 DIAGNOSIS — G8929 Other chronic pain: Secondary | ICD-10-CM | POA: Diagnosis not present

## 2020-07-28 DIAGNOSIS — K5730 Diverticulosis of large intestine without perforation or abscess without bleeding: Secondary | ICD-10-CM | POA: Diagnosis not present

## 2020-07-28 DIAGNOSIS — E785 Hyperlipidemia, unspecified: Secondary | ICD-10-CM | POA: Diagnosis not present

## 2020-07-28 DIAGNOSIS — M1712 Unilateral primary osteoarthritis, left knee: Secondary | ICD-10-CM | POA: Diagnosis not present

## 2020-07-28 DIAGNOSIS — M109 Gout, unspecified: Secondary | ICD-10-CM | POA: Diagnosis not present

## 2020-07-28 DIAGNOSIS — M1990 Unspecified osteoarthritis, unspecified site: Secondary | ICD-10-CM | POA: Diagnosis not present

## 2020-07-28 DIAGNOSIS — D631 Anemia in chronic kidney disease: Secondary | ICD-10-CM | POA: Diagnosis not present

## 2020-07-28 DIAGNOSIS — M5416 Radiculopathy, lumbar region: Secondary | ICD-10-CM | POA: Diagnosis not present

## 2020-07-28 DIAGNOSIS — I5033 Acute on chronic diastolic (congestive) heart failure: Secondary | ICD-10-CM | POA: Diagnosis not present

## 2020-07-28 DIAGNOSIS — E1122 Type 2 diabetes mellitus with diabetic chronic kidney disease: Secondary | ICD-10-CM | POA: Diagnosis not present

## 2020-07-28 DIAGNOSIS — I130 Hypertensive heart and chronic kidney disease with heart failure and stage 1 through stage 4 chronic kidney disease, or unspecified chronic kidney disease: Secondary | ICD-10-CM | POA: Diagnosis not present

## 2020-07-28 DIAGNOSIS — N184 Chronic kidney disease, stage 4 (severe): Secondary | ICD-10-CM | POA: Diagnosis not present

## 2020-07-30 ENCOUNTER — Other Ambulatory Visit: Payer: Self-pay | Admitting: Endocrinology

## 2020-07-31 ENCOUNTER — Other Ambulatory Visit: Payer: Self-pay | Admitting: *Deleted

## 2020-07-31 DIAGNOSIS — N184 Chronic kidney disease, stage 4 (severe): Secondary | ICD-10-CM

## 2020-08-03 DIAGNOSIS — E785 Hyperlipidemia, unspecified: Secondary | ICD-10-CM | POA: Diagnosis not present

## 2020-08-03 DIAGNOSIS — K5730 Diverticulosis of large intestine without perforation or abscess without bleeding: Secondary | ICD-10-CM | POA: Diagnosis not present

## 2020-08-03 DIAGNOSIS — D631 Anemia in chronic kidney disease: Secondary | ICD-10-CM | POA: Diagnosis not present

## 2020-08-03 DIAGNOSIS — I130 Hypertensive heart and chronic kidney disease with heart failure and stage 1 through stage 4 chronic kidney disease, or unspecified chronic kidney disease: Secondary | ICD-10-CM | POA: Diagnosis not present

## 2020-08-03 DIAGNOSIS — M1990 Unspecified osteoarthritis, unspecified site: Secondary | ICD-10-CM | POA: Diagnosis not present

## 2020-08-03 DIAGNOSIS — I5033 Acute on chronic diastolic (congestive) heart failure: Secondary | ICD-10-CM | POA: Diagnosis not present

## 2020-08-03 DIAGNOSIS — M109 Gout, unspecified: Secondary | ICD-10-CM | POA: Diagnosis not present

## 2020-08-03 DIAGNOSIS — N184 Chronic kidney disease, stage 4 (severe): Secondary | ICD-10-CM | POA: Diagnosis not present

## 2020-08-03 DIAGNOSIS — M5416 Radiculopathy, lumbar region: Secondary | ICD-10-CM | POA: Diagnosis not present

## 2020-08-03 DIAGNOSIS — K5900 Constipation, unspecified: Secondary | ICD-10-CM | POA: Diagnosis not present

## 2020-08-03 DIAGNOSIS — G8929 Other chronic pain: Secondary | ICD-10-CM | POA: Diagnosis not present

## 2020-08-03 DIAGNOSIS — M1712 Unilateral primary osteoarthritis, left knee: Secondary | ICD-10-CM | POA: Diagnosis not present

## 2020-08-03 DIAGNOSIS — E1122 Type 2 diabetes mellitus with diabetic chronic kidney disease: Secondary | ICD-10-CM | POA: Diagnosis not present

## 2020-08-10 DIAGNOSIS — K5730 Diverticulosis of large intestine without perforation or abscess without bleeding: Secondary | ICD-10-CM | POA: Diagnosis not present

## 2020-08-10 DIAGNOSIS — G8929 Other chronic pain: Secondary | ICD-10-CM | POA: Diagnosis not present

## 2020-08-10 DIAGNOSIS — N184 Chronic kidney disease, stage 4 (severe): Secondary | ICD-10-CM | POA: Diagnosis not present

## 2020-08-10 DIAGNOSIS — I130 Hypertensive heart and chronic kidney disease with heart failure and stage 1 through stage 4 chronic kidney disease, or unspecified chronic kidney disease: Secondary | ICD-10-CM | POA: Diagnosis not present

## 2020-08-10 DIAGNOSIS — D631 Anemia in chronic kidney disease: Secondary | ICD-10-CM | POA: Diagnosis not present

## 2020-08-10 DIAGNOSIS — K5900 Constipation, unspecified: Secondary | ICD-10-CM | POA: Diagnosis not present

## 2020-08-10 DIAGNOSIS — E785 Hyperlipidemia, unspecified: Secondary | ICD-10-CM | POA: Diagnosis not present

## 2020-08-10 DIAGNOSIS — M1990 Unspecified osteoarthritis, unspecified site: Secondary | ICD-10-CM | POA: Diagnosis not present

## 2020-08-10 DIAGNOSIS — M109 Gout, unspecified: Secondary | ICD-10-CM | POA: Diagnosis not present

## 2020-08-10 DIAGNOSIS — I5033 Acute on chronic diastolic (congestive) heart failure: Secondary | ICD-10-CM | POA: Diagnosis not present

## 2020-08-10 DIAGNOSIS — M5416 Radiculopathy, lumbar region: Secondary | ICD-10-CM | POA: Diagnosis not present

## 2020-08-10 DIAGNOSIS — M1712 Unilateral primary osteoarthritis, left knee: Secondary | ICD-10-CM | POA: Diagnosis not present

## 2020-08-10 DIAGNOSIS — E1122 Type 2 diabetes mellitus with diabetic chronic kidney disease: Secondary | ICD-10-CM | POA: Diagnosis not present

## 2020-08-11 ENCOUNTER — Ambulatory Visit (HOSPITAL_COMMUNITY): Admission: RE | Admit: 2020-08-11 | Payer: Medicare Other | Source: Ambulatory Visit

## 2020-08-13 DIAGNOSIS — S301XXA Contusion of abdominal wall, initial encounter: Secondary | ICD-10-CM | POA: Diagnosis not present

## 2020-08-13 DIAGNOSIS — K5730 Diverticulosis of large intestine without perforation or abscess without bleeding: Secondary | ICD-10-CM | POA: Diagnosis not present

## 2020-08-13 DIAGNOSIS — N200 Calculus of kidney: Secondary | ICD-10-CM | POA: Diagnosis not present

## 2020-08-13 DIAGNOSIS — N2889 Other specified disorders of kidney and ureter: Secondary | ICD-10-CM | POA: Diagnosis not present

## 2020-08-13 DIAGNOSIS — K6389 Other specified diseases of intestine: Secondary | ICD-10-CM | POA: Diagnosis not present

## 2020-08-13 DIAGNOSIS — D649 Anemia, unspecified: Secondary | ICD-10-CM | POA: Diagnosis not present

## 2020-08-13 DIAGNOSIS — I708 Atherosclerosis of other arteries: Secondary | ICD-10-CM | POA: Diagnosis not present

## 2020-08-18 ENCOUNTER — Encounter (HOSPITAL_COMMUNITY): Payer: Medicare Other

## 2020-08-21 ENCOUNTER — Encounter (HOSPITAL_COMMUNITY): Payer: Medicare Other

## 2020-08-21 ENCOUNTER — Ambulatory Visit (HOSPITAL_COMMUNITY): Payer: Medicare Other

## 2020-08-24 ENCOUNTER — Ambulatory Visit: Payer: Medicare Other

## 2020-09-04 ENCOUNTER — Encounter (HOSPITAL_COMMUNITY)
Admission: RE | Admit: 2020-09-04 | Discharge: 2020-09-04 | Disposition: A | Discharge status: Home / Self Care | Payer: Medicare Other | Source: Ambulatory Visit | Attending: Nephrology | Admitting: Nephrology

## 2020-09-04 VITALS — BP 166/66 | HR 61 | Temp 97.8°F | Resp 20

## 2020-09-04 DIAGNOSIS — N183 Chronic kidney disease, stage 3 unspecified: Secondary | ICD-10-CM | POA: Insufficient documentation

## 2020-09-04 DIAGNOSIS — N2581 Secondary hyperparathyroidism of renal origin: Secondary | ICD-10-CM | POA: Diagnosis not present

## 2020-09-04 DIAGNOSIS — I120 Hypertensive chronic kidney disease with stage 5 chronic kidney disease or end stage renal disease: Secondary | ICD-10-CM | POA: Diagnosis not present

## 2020-09-04 DIAGNOSIS — D631 Anemia in chronic kidney disease: Secondary | ICD-10-CM | POA: Insufficient documentation

## 2020-09-04 DIAGNOSIS — E1122 Type 2 diabetes mellitus with diabetic chronic kidney disease: Secondary | ICD-10-CM | POA: Diagnosis not present

## 2020-09-04 DIAGNOSIS — I770 Arteriovenous fistula, acquired: Secondary | ICD-10-CM | POA: Diagnosis not present

## 2020-09-04 DIAGNOSIS — N185 Chronic kidney disease, stage 5: Secondary | ICD-10-CM | POA: Diagnosis not present

## 2020-09-04 DIAGNOSIS — R809 Proteinuria, unspecified: Secondary | ICD-10-CM | POA: Diagnosis not present

## 2020-09-04 LAB — RENAL FUNCTION PANEL
Albumin: 3.7 g/dL (ref 3.5–5.0)
Anion gap: 11 (ref 5–15)
BUN: 116 mg/dL — ABNORMAL HIGH (ref 8–23)
CO2: 26 mmol/L (ref 22–32)
Calcium: 9 mg/dL (ref 8.9–10.3)
Chloride: 97 mmol/L — ABNORMAL LOW (ref 98–111)
Creatinine, Ser: 3.88 mg/dL — ABNORMAL HIGH (ref 0.44–1.00)
GFR, Estimated: 12 mL/min — ABNORMAL LOW (ref >60)
Glucose, Bld: 71 mg/dL (ref 70–99)
Phosphorus: 5.7 mg/dL — ABNORMAL HIGH (ref 2.5–4.6)
Potassium: 3.4 mmol/L — ABNORMAL LOW (ref 3.5–5.1)
Sodium: 134 mmol/L — ABNORMAL LOW (ref 135–145)

## 2020-09-04 LAB — FERRITIN: Ferritin: 274 ng/mL (ref 11–307)

## 2020-09-04 LAB — IRON AND TIBC
Iron: 67 ug/dL (ref 28–170)
Saturation Ratios: 26 % (ref 10.4–31.8)
TIBC: 259 ug/dL (ref 250–450)
UIBC: 192 ug/dL

## 2020-09-04 LAB — POCT HEMOGLOBIN-HEMACUE: Hemoglobin: 8.7 g/dL — ABNORMAL LOW (ref 12.0–15.0)

## 2020-09-04 MED ORDER — EPOETIN ALFA-EPBX 40000 UNIT/ML IJ SOLN
40000.0000 [IU] | INTRAMUSCULAR | Status: DC
  Administered 2020-09-04: 40000 [IU] via SUBCUTANEOUS

## 2020-09-04 MED ORDER — EPOETIN ALFA-EPBX 40000 UNIT/ML IJ SOLN
INTRAMUSCULAR | Status: AC
  Filled 2020-09-04: qty 1

## 2020-09-10 ENCOUNTER — Other Ambulatory Visit: Payer: Self-pay

## 2020-09-10 DIAGNOSIS — N184 Chronic kidney disease, stage 4 (severe): Secondary | ICD-10-CM

## 2020-09-17 DIAGNOSIS — K5521 Angiodysplasia of colon with hemorrhage: Secondary | ICD-10-CM | POA: Diagnosis not present

## 2020-09-17 DIAGNOSIS — D500 Iron deficiency anemia secondary to blood loss (chronic): Secondary | ICD-10-CM | POA: Diagnosis not present

## 2020-09-17 DIAGNOSIS — E119 Type 2 diabetes mellitus without complications: Secondary | ICD-10-CM | POA: Diagnosis not present

## 2020-09-21 ENCOUNTER — Other Ambulatory Visit: Payer: Self-pay

## 2020-09-21 ENCOUNTER — Ambulatory Visit (INDEPENDENT_AMBULATORY_CARE_PROVIDER_SITE_OTHER): Payer: Medicare Other | Admitting: Physician Assistant

## 2020-09-21 ENCOUNTER — Ambulatory Visit (HOSPITAL_COMMUNITY)
Admission: RE | Admit: 2020-09-21 | Discharge: 2020-09-21 | Disposition: A | Discharge status: Home / Self Care | Payer: Medicare Other | Source: Ambulatory Visit | Attending: Surgery | Admitting: Surgery

## 2020-09-21 VITALS — BP 130/58 | HR 74 | Temp 97.9°F | Resp 20 | Ht 62.0 in | Wt 121.9 lb

## 2020-09-21 DIAGNOSIS — N184 Chronic kidney disease, stage 4 (severe): Secondary | ICD-10-CM | POA: Diagnosis not present

## 2020-09-21 NOTE — Progress Notes (Signed)
POST OPERATIVE OFFICE NOTE    CC:  F/u for surgery  HPI:  This is a 70 y.o. female who is s/p Left UE cephalic vein fistula creation on 07/16/20 by Dr. Stanford Breed.  She is not on HD at this time and is followed by Dr. Joelyn Oms.    Pt returns today for follow up.  Pt states she has not lost sensation or strength.  No symptoms of steal.    Allergies  Allergen Reactions   Semaglutide Rash    Rybelsus 3 mg    Current Outpatient Medications  Medication Sig Dispense Refill   acarbose (PRECOSE) 25 MG tablet TAKE 1/2 TABLETS (12.5 MG TOTAL) BY MOUTH 3 (THREE) TIMES DAILY WITH MEALS. 135 tablet 1   acetaminophen (TYLENOL) 500 MG tablet Take 1,000 mg by mouth every 6 (six) hours as needed for moderate pain.     allopurinol (ZYLOPRIM) 100 MG tablet Take 1 tablet (100 mg total) by mouth daily. 30 tablet 0   amLODipine (NORVASC) 10 MG tablet TAKE 1 TABLET BY MOUTH EVERY DAY (Patient taking differently: Take 10 mg by mouth daily.) 90 tablet 0   atorvastatin (LIPITOR) 80 MG tablet TAKE 1 TABLET BY MOUTH EVERY DAY (Patient taking differently: Take 80 mg by mouth daily.) 90 tablet 1   Blood Glucose Monitoring Suppl (ONETOUCH VERIO FLEX SYSTEM) w/Device KIT 1 each by Does not apply route 2 (two) times daily. E11.9     bromocriptine (PARLODEL) 2.5 MG tablet Take 0.5 tablets (1.25 mg total) by mouth daily. (Patient not taking: Reported on 07/16/2020) 45 tablet 1   chlorthalidone (HYGROTON) 25 MG tablet Take 25 mg by mouth daily.     epoetin alfa-epbx (RETACRIT) 96222 UNIT/ML injection Inject 40,000 Units into the skin every 28 (twenty-eight) days.     glucose blood (ONETOUCH VERIO) test strip USE AS DIRECTED TWICE DAILY 200 strip 3   hydrALAZINE (APRESOLINE) 50 MG tablet Take 1 tablet (50 mg total) by mouth every 8 (eight) hours. (Patient taking differently: Take 50 mg by mouth daily.) 90 tablet 0   isosorbide mononitrate (IMDUR) 30 MG 24 hr tablet Take 1 tablet (30 mg total) by mouth daily. 30 tablet 0    metoprolol succinate (TOPROL-XL) 100 MG 24 hr tablet Take 100 mg by mouth daily.     minoxidil (LONITEN) 10 MG tablet Take 10 mg by mouth 2 (two) times daily.     OneTouch Delica Lancets 97L MISC 1 each by Does not apply route 2 (two) times daily. E11.9     oxyCODONE-acetaminophen (PERCOCET) 5-325 MG tablet Take 1 tablet by mouth every 6 (six) hours as needed for severe pain. 8 tablet 0   pantoprazole (PROTONIX) 40 MG tablet Take 1 tablet (40 mg total) by mouth daily. 90 tablet 3   polyethylene glycol (MIRALAX / GLYCOLAX) 17 g packet Take 34-51 g by mouth daily as needed for mild constipation.     repaglinide (PRANDIN) 2 MG tablet Take 1 tablet (2 mg total) by mouth 2 (two) times daily before a meal. 180 tablet 3   torsemide (DEMADEX) 20 MG tablet Take 4 tablets (80 mg total) by mouth 2 (two) times daily. (Patient not taking: Reported on 07/16/2020) 240 tablet 0   No current facility-administered medications for this visit.     ROS:  See HPI  Physical Exam:     Findings:  +--------------------+----------+-----------------+--------+  AVF                 PSV (cm/s)Flow Vol (  mL/min)Comments  +--------------------+----------+-----------------+--------+  Native artery inflow   169           265                 +--------------------+----------+-----------------+--------+  AVF Anastomosis        115                               +--------------------+----------+-----------------+--------+      +------------+----------+-------------+----------+--------+  OUTFLOW VEINPSV (cm/s)Diameter (cm)Depth (cm)Describe  +------------+----------+-------------+----------+--------+  Prox UA         48        0.45        0.55             +------------+----------+-------------+----------+--------+  Mid UA          70        0.45        0.25             +------------+----------+-------------+----------+--------+  Dist UA        622        0.34        0.39   Ratio 5    +------------+----------+-------------+----------+--------+  AC Fossa       123        0.69        0.18             +------------+----------+-------------+----------+--------+    Summary:  Patent left brachiocephalic AVF.  High resistant, low velocity inflow artery.  Narrowing of the outflow vein in the distal upper arm with hemodynamically  significant stenosis.  Low flow volume.      Incision:  well healed  Extremities:  grip equal B, palpable radial pulse, thrill at anastomosis, but decreased proximally Neuro: sensation intact    Assessment/Plan:  This is a 70 y.o. female who is s/p: left cephalic vein fistula creation.  The fistula has significant narrowing with PSV of 622.  She has 2 options  1.) Leave it be and exercise her arm to increase blood flow.  If it stays open until she needs HD we can perform a fistulogram and venoplasty.  2.) Fistulogram with intervention and she may need HD sooner due to contrast use.        She will wait and see if the fistula survives.  I gave her a stress ball to increase the blood volume demand and try to assist the fistula to stay open.  F/U PRN until HD is needed.       Roxy Horseman PA-C [facility] [phone]   Clinic MD:  Trula Slade

## 2020-09-24 ENCOUNTER — Ambulatory Visit: Payer: Medicare Other | Admitting: Endocrinology

## 2020-09-25 ENCOUNTER — Ambulatory Visit: Payer: Medicare Other | Admitting: Podiatry

## 2020-10-02 ENCOUNTER — Encounter (HOSPITAL_COMMUNITY)
Admission: RE | Admit: 2020-10-02 | Discharge: 2020-10-02 | Disposition: A | Discharge status: Home / Self Care | Payer: Medicare Other | Source: Ambulatory Visit | Attending: Nephrology | Admitting: Nephrology

## 2020-10-02 VITALS — BP 157/56 | HR 75 | Temp 98.5°F | Resp 17

## 2020-10-02 DIAGNOSIS — N183 Chronic kidney disease, stage 3 unspecified: Secondary | ICD-10-CM | POA: Diagnosis not present

## 2020-10-02 LAB — POCT HEMOGLOBIN-HEMACUE: Hemoglobin: 9.7 g/dL — ABNORMAL LOW (ref 12.0–15.0)

## 2020-10-02 MED ORDER — EPOETIN ALFA-EPBX 40000 UNIT/ML IJ SOLN: 40000.0000 [IU] | INTRAMUSCULAR | Status: DC

## 2020-10-02 MED ORDER — EPOETIN ALFA-EPBX 40000 UNIT/ML IJ SOLN
INTRAMUSCULAR | Status: AC
  Administered 2020-10-02: 40000 [IU] via SUBCUTANEOUS
  Filled 2020-10-02: qty 1

## 2020-10-05 ENCOUNTER — Other Ambulatory Visit: Payer: Self-pay | Admitting: Family

## 2020-10-13 ENCOUNTER — Telehealth: Payer: Medicare Other

## 2020-10-23 DIAGNOSIS — N185 Chronic kidney disease, stage 5: Secondary | ICD-10-CM | POA: Diagnosis not present

## 2020-10-23 DIAGNOSIS — N2581 Secondary hyperparathyroidism of renal origin: Secondary | ICD-10-CM | POA: Diagnosis not present

## 2020-10-23 DIAGNOSIS — E1122 Type 2 diabetes mellitus with diabetic chronic kidney disease: Secondary | ICD-10-CM | POA: Diagnosis not present

## 2020-10-23 DIAGNOSIS — R809 Proteinuria, unspecified: Secondary | ICD-10-CM | POA: Diagnosis not present

## 2020-10-23 DIAGNOSIS — I770 Arteriovenous fistula, acquired: Secondary | ICD-10-CM | POA: Diagnosis not present

## 2020-10-23 DIAGNOSIS — D631 Anemia in chronic kidney disease: Secondary | ICD-10-CM | POA: Diagnosis not present

## 2020-10-23 DIAGNOSIS — I120 Hypertensive chronic kidney disease with stage 5 chronic kidney disease or end stage renal disease: Secondary | ICD-10-CM | POA: Diagnosis not present

## 2020-10-27 DIAGNOSIS — R110 Nausea: Secondary | ICD-10-CM | POA: Insufficient documentation

## 2020-10-27 DIAGNOSIS — E673 Hypervitaminosis D: Secondary | ICD-10-CM | POA: Insufficient documentation

## 2020-10-27 DIAGNOSIS — N186 End stage renal disease: Secondary | ICD-10-CM | POA: Insufficient documentation

## 2020-10-27 DIAGNOSIS — D689 Coagulation defect, unspecified: Secondary | ICD-10-CM | POA: Insufficient documentation

## 2020-10-27 DIAGNOSIS — N2581 Secondary hyperparathyroidism of renal origin: Secondary | ICD-10-CM | POA: Insufficient documentation

## 2020-10-27 DIAGNOSIS — Z992 Dependence on renal dialysis: Secondary | ICD-10-CM | POA: Diagnosis not present

## 2020-10-30 ENCOUNTER — Encounter (HOSPITAL_COMMUNITY): Payer: Medicare Other

## 2020-11-11 DIAGNOSIS — T82858A Stenosis of vascular prosthetic devices, implants and grafts, initial encounter: Secondary | ICD-10-CM | POA: Diagnosis not present

## 2020-11-11 DIAGNOSIS — Z992 Dependence on renal dialysis: Secondary | ICD-10-CM | POA: Diagnosis not present

## 2020-11-11 DIAGNOSIS — I871 Compression of vein: Secondary | ICD-10-CM | POA: Diagnosis not present

## 2020-11-11 DIAGNOSIS — N186 End stage renal disease: Secondary | ICD-10-CM | POA: Diagnosis not present

## 2020-11-15 ENCOUNTER — Other Ambulatory Visit: Payer: Self-pay | Admitting: Family

## 2020-11-23 ENCOUNTER — Other Ambulatory Visit: Payer: Self-pay

## 2020-11-23 ENCOUNTER — Encounter: Payer: Self-pay | Admitting: Internal Medicine

## 2020-11-23 ENCOUNTER — Ambulatory Visit (INDEPENDENT_AMBULATORY_CARE_PROVIDER_SITE_OTHER): Payer: Medicare Other | Admitting: Internal Medicine

## 2020-11-23 VITALS — BP 128/60 | HR 63 | Ht 62.0 in | Wt 123.0 lb

## 2020-11-23 DIAGNOSIS — I5032 Chronic diastolic (congestive) heart failure: Secondary | ICD-10-CM

## 2020-11-23 DIAGNOSIS — I10 Essential (primary) hypertension: Secondary | ICD-10-CM | POA: Diagnosis not present

## 2020-11-23 DIAGNOSIS — N186 End stage renal disease: Secondary | ICD-10-CM

## 2020-11-23 DIAGNOSIS — Z992 Dependence on renal dialysis: Secondary | ICD-10-CM | POA: Diagnosis not present

## 2020-11-23 DIAGNOSIS — E785 Hyperlipidemia, unspecified: Secondary | ICD-10-CM | POA: Diagnosis not present

## 2020-11-23 DIAGNOSIS — I129 Hypertensive chronic kidney disease with stage 1 through stage 4 chronic kidney disease, or unspecified chronic kidney disease: Secondary | ICD-10-CM | POA: Diagnosis not present

## 2020-11-23 NOTE — Progress Notes (Signed)
OFFICE NOTE  Chief Complaint:  Follow-up heart failure  Primary Care Physician: Erby Pian, PA-C  HPI:  [name] is a 70 y.o. female with a past medial history significant for type II diabetes melitis, hypertension, CVA, acute on chronic diastolic CHF, GERD, CKD stage IV, tobacco abuse, HLD, systolic murmur, vitamin D deficiency, and anemia secondary to GI bleed.  She was hospitalized April 25-29 and then May 11-18 with diagnosis of symptomatic anemia possibly related to GI bleeding and again with CHF.  She underwent EGD and colonoscopy which showed no source of bleeding.  She returned to the emergency department 06/27/2020 with shortness of breath, weakness, and hemoglobin of 5.2.  She had a hemoglobin of 8.9 at discharge on May 18.  She denied melena and bright red rectal bleeding.  She reported having taken aspirin 325 daily and Aleve 2 days prior to her admission.  She received 2 units of PRBCs.  She underwent capsule endoscopy which was pending at the time of her discharge.  Her aspirin and NSAID were held.  She was not noted to have bleeding during her hospital stay.  She remained stable and was discharged 06/29/2020.  11/23/2020  She was seen out of the hospital by Coletta Memos, NP for diastolic heart failure.  A repeat echo showed that the LVEF was 70 to 75% with severe LV wall thickening and atrial enlargement.  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  She is on numerous medications and it appears that her blood pressure is now finally better controlled at 128/60.  She is progressed to dialysis and is using a dialysis catheter.  She does have a left arm fistula which was recently revised and hopefully will begin using soon.  She denies any worsening shortness of breath or chest pain.  PMHx:  Past Medical History:  Diagnosis Date   Acute kidney injury ([place]) 01/08/2015   Arthritis    Chronic back pain    Constipation    Cough    Diabetes mellitus, type 2  ([place])    Diverticulosis    Fibroid    patient thinks this was the reason for her hysterectomy   GERD (gastroesophageal reflux disease)    Heart murmur    History of sebaceous cyst    Hyperlipemia    Hyperplastic colon polyp    Hypertension    Hyperthyroidism    Lumbar radiculopathy    Shortness of breath 09/13/2013   Stroke ([place]) 2015?   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;  Service: Vascular;  Laterality: Left;  PERIPHERAL NERVE BLOCK   COLONOSCOPY W/ BIOPSIES  09/11/2008   ESOPHAGOGASTRODUODENOSCOPY (EGD) WITH PROPOFOL N/A 05/20/2020   Procedure: ESOPHAGOGASTRODUODENOSCOPY (EGD) WITH PROPOFOL;  Surgeon: Irene Shipper, MD;  Location: [facility];  Service: Endoscopy;  Laterality: N/A;   GIVENS CAPSULE STUDY N/A 06/28/2020   Procedure: GIVENS CAPSULE STUDY;  Surgeon: Irene Shipper, MD;  Location: [facility];  Service: Endoscopy;  Laterality: N/A;   RETINOPATHY SURGERY Bilateral 2013   Dr. Zigmund Daniel    FAMHx:  Family History  Problem Relation Age of Onset   Hypertension Mother  Sleep apnea Mother    Diabetes Mother    Hyperlipidemia Mother    Lung cancer Father        smoker   Hypertension Sister    Diabetes Sister    Hypertension Brother    Hypertension Sister    Diabetes Brother    Hypertension Brother    Breast cancer Neg Hx    Colon cancer Neg Hx    Esophageal cancer Neg Hx    Pancreatic cancer Neg Hx    Liver disease Neg Hx    Colon polyps Neg Hx     SOCHx:   reports that she quit smoking about 6 years ago. Her smoking use included cigarettes. She has a 17.50 pack-year smoking history. She has never used smokeless tobacco. She reports that she  does not drink alcohol and does not use drugs.  ALLERGIES:  Allergies  Allergen Reactions   Semaglutide Rash    Rybelsus 3 mg    ROS: Pertinent items noted in HPI and remainder of comprehensive ROS otherwise negative.  HOME MEDS: Current Outpatient Medications on File Prior to Visit  Medication Sig Dispense Refill   acarbose (PRECOSE) 25 MG tablet TAKE 1/2 TABLETS (12.5 MG TOTAL) BY MOUTH 3 (THREE) TIMES DAILY WITH MEALS. 135 tablet 1   acetaminophen (TYLENOL) 500 MG tablet Take 1,000 mg by mouth every 6 (six) hours as needed for moderate pain.     allopurinol (ZYLOPRIM) 100 MG tablet Take 1 tablet (100 mg total) by mouth daily. 30 tablet 0   amLODipine (NORVASC) 10 MG tablet TAKE 1 TABLET BY MOUTH EVERY DAY (Patient taking differently: Take 10 mg by mouth daily.) 90 tablet 0   atorvastatin (LIPITOR) 80 MG tablet TAKE 1 TABLET BY MOUTH EVERY DAY (Patient taking differently: Take 80 mg by mouth daily.) 90 tablet 1   B Complex-C-Folic Acid (DIALYVITE 828 PO) Take 1 tablet by mouth daily.     Blood Glucose Monitoring Suppl (ONETOUCH VERIO FLEX SYSTEM) w/Device KIT 1 each by Does not apply route 2 (two) times daily. E11.9     bromocriptine (PARLODEL) 2.5 MG tablet Take 0.5 tablets (1.25 mg total) by mouth daily. 45 tablet 1   Calcium Acetate 667 MG TABS Take 1 tablet by mouth daily.     epoetin alfa-epbx (RETACRIT) 00349 UNIT/ML injection Inject 40,000 Units into the skin every 28 (twenty-eight) days.     glucose blood (ONETOUCH VERIO) test strip USE AS DIRECTED TWICE DAILY 200 strip 3   isosorbide mononitrate (IMDUR) 30 MG 24 hr tablet Take 1 tablet (30 mg total) by mouth daily. 30 tablet 0   metoprolol succinate (TOPROL-XL) 100 MG 24 hr tablet Take 100 mg by mouth daily.     minoxidil (LONITEN) 10 MG tablet Take 10 mg by mouth 2 (two) times daily.     OneTouch Delica Lancets 17H MISC 1 each by Does not apply route 2 (two) times daily. E11.9     oxyCODONE-acetaminophen (PERCOCET) 5-325 MG  tablet Take 1 tablet by mouth every 6 (six) hours as needed for severe pain. 8 tablet 0   pantoprazole (PROTONIX) 40 MG tablet Take 1 tablet (40 mg total) by mouth daily. 90 tablet 3   polyethylene glycol (MIRALAX / GLYCOLAX) 17 g packet Take 34-51 g by mouth daily as needed for mild constipation.     repaglinide (PRANDIN) 2 MG tablet Take 1 tablet (2 mg total) by mouth 2 (two) times daily before a meal. 180 tablet 3   hydrALAZINE (APRESOLINE)  50 MG tablet Take 1 tablet (50 mg total) by mouth every 8 (eight) hours. (Patient taking differently: Take 50 mg by mouth daily.) 90 tablet 0   No current facility-administered medications on file prior to visit.    LABS/IMAGING: No results found for this or any previous visit (from the past 48 hour(s)). No results found.  LIPID PANEL:    Component Value Date/Time   CHOL 155 07/11/2018 1258   TRIG 151.0 (H) 07/11/2018 1258   HDL 44.70 07/11/2018 1258   CHOLHDL 3 07/11/2018 1258   VLDL 30.2 07/11/2018 1258   LDLCALC 80 07/11/2018 1258   LDLDIRECT 79.0 07/10/2017 1359     WEIGHTS: Wt Readings from Last 3 Encounters:  11/23/20 123 lb (55.8 kg)  09/21/20 121 lb 14.4 oz (55.3 kg)  07/16/20 127 lb (57.6 kg)    VITALS: BP 128/60 (BP Location: Right Arm, Patient Position: Sitting, Cuff Size: Normal)   Pulse 63   Ht $R'5\' 2"'pn$  (1.575 m)   Wt 123 lb (55.8 kg)   BMI 22.50 kg/m   EXAM: General appearance: alert and no distress Neck: no carotid bruit, no JVD, and thyroid not enlarged, symmetric, no tenderness/mass/nodules Lungs: clear to auscultation bilaterally Heart: regular rate and rhythm Abdomen: soft, non-tender; bowel sounds normal; no masses,  no organomegaly Extremities: extremities normal, atraumatic, no cyanosis or edema and left upper extremity fistula with positive thrill Pulses: 2+ and symmetric Skin: Skin color, texture, turgor normal. No rashes or lesions Neurologic: Grossly normal Psych: Pleasant  EKG: Normal sinus rhythm at  63, possible left atrial argument, LVH by voltage and mildly prolonged QTC at 485 ms- personally reviewed  ASSESSMENT: Chronic diastolic congestive heart failure Hypertensive heart disease Aortic stenosis Dyslipidemia End-stage renal disease on HD  PLAN: 1.   Ms. Miers has stable chronic diastolic congestive heart failure which is managed primarily at dialysis.  Although she makes urine and she is not on a diuretic.  Blood pressure is well controlled although she is on numerous medications and was poorly controlled for years, I think leading to probable hypertensive cardiomyopathy.  There was question about work-up for possible amyloid however is felt that this is less likely than a hypertensive cardiomyopathy as a cause of her symptoms.  She is on statin therapy although there is probably minimal benefit demonstrated patients with end-stage renal disease.  No changes to her medicines today.  Plan follow-up with Coletta Memos, NP in 6 months in 1 year with me.  Pixie Casino, MD, [facility], [place] Director of the Advanced Lipid Disorders &  Cardiovascular Risk Reduction Clinic Diplomate of the American Board of Clinical Lipidology Attending Cardiologist  Direct Dial: [phone]  Fax: [phone]  Website:  [email or web address] 11/23/2020, 12:52 PM

## 2020-11-23 NOTE — Patient Instructions (Addendum)
Medication Instructions:  Your physician recommends that you continue on your current medications as directed. Please refer to the Current Medication list given to you today.  *If you need a refill on your cardiac medications before your next appointment, please call your pharmacy*   Follow-Up: At [facility], you and your health needs are our priority.  As part of our continuing mission to provide you with exceptional heart care, we have created designated Provider Care Teams.  These Care Teams include your primary Cardiologist (physician) and Advanced Practice Providers (APPs -  Physician Assistants and Nurse Practitioners) who all work together to provide you with the care you need, when you need it.  We recommend signing up for the patient portal called "MyChart".  Sign up information is provided on this After Visit Summary.  MyChart is used to connect with patients for Virtual Visits (Telemedicine).  Patients are able to view lab/test results, encounter notes, upcoming appointments, etc.  Non-urgent messages can be sent to your provider as well.   To learn more about what you can do with MyChart, go to [email or web address].    Your next appointment:   6 month(s)  The format for your next appointment:   In Person  Provider:   Coletta Memos NP in 6 months  Then, Pixie Casino, MD will plan to see you again in 12 month(s).   Other Instructions

## 2020-11-27 ENCOUNTER — Encounter (HOSPITAL_COMMUNITY): Payer: Medicare Other

## 2021-01-20 ENCOUNTER — Ambulatory Visit
Admission: RE | Admit: 2021-01-20 | Discharge: 2021-01-20 | Disposition: A | Discharge status: Home / Self Care | Payer: Medicare Other | Source: Ambulatory Visit | Attending: Physician Assistant | Admitting: Physician Assistant

## 2021-01-20 ENCOUNTER — Other Ambulatory Visit: Payer: Self-pay | Admitting: Physician Assistant

## 2021-01-20 ENCOUNTER — Other Ambulatory Visit: Payer: Self-pay

## 2021-01-20 DIAGNOSIS — Z1231 Encounter for screening mammogram for malignant neoplasm of breast: Secondary | ICD-10-CM

## 2021-01-26 DIAGNOSIS — N2581 Secondary hyperparathyroidism of renal origin: Secondary | ICD-10-CM | POA: Diagnosis not present

## 2021-01-26 DIAGNOSIS — D631 Anemia in chronic kidney disease: Secondary | ICD-10-CM | POA: Diagnosis not present

## 2021-01-26 DIAGNOSIS — N186 End stage renal disease: Secondary | ICD-10-CM | POA: Diagnosis not present

## 2021-01-26 DIAGNOSIS — E039 Hypothyroidism, unspecified: Secondary | ICD-10-CM | POA: Diagnosis not present

## 2021-01-26 DIAGNOSIS — D509 Iron deficiency anemia, unspecified: Secondary | ICD-10-CM | POA: Diagnosis not present

## 2021-01-26 DIAGNOSIS — Z992 Dependence on renal dialysis: Secondary | ICD-10-CM | POA: Diagnosis not present

## 2021-01-26 DIAGNOSIS — D689 Coagulation defect, unspecified: Secondary | ICD-10-CM | POA: Diagnosis not present

## 2021-01-26 DIAGNOSIS — E119 Type 2 diabetes mellitus without complications: Secondary | ICD-10-CM | POA: Diagnosis not present

## 2021-01-28 DIAGNOSIS — D689 Coagulation defect, unspecified: Secondary | ICD-10-CM | POA: Diagnosis not present

## 2021-01-28 DIAGNOSIS — E119 Type 2 diabetes mellitus without complications: Secondary | ICD-10-CM | POA: Diagnosis not present

## 2021-01-28 DIAGNOSIS — E039 Hypothyroidism, unspecified: Secondary | ICD-10-CM | POA: Diagnosis not present

## 2021-01-28 DIAGNOSIS — D509 Iron deficiency anemia, unspecified: Secondary | ICD-10-CM | POA: Diagnosis not present

## 2021-01-28 DIAGNOSIS — D631 Anemia in chronic kidney disease: Secondary | ICD-10-CM | POA: Diagnosis not present

## 2021-01-28 DIAGNOSIS — N186 End stage renal disease: Secondary | ICD-10-CM | POA: Diagnosis not present

## 2021-01-28 DIAGNOSIS — Z992 Dependence on renal dialysis: Secondary | ICD-10-CM | POA: Diagnosis not present

## 2021-01-28 DIAGNOSIS — N2581 Secondary hyperparathyroidism of renal origin: Secondary | ICD-10-CM | POA: Diagnosis not present

## 2021-01-30 DIAGNOSIS — N186 End stage renal disease: Secondary | ICD-10-CM | POA: Diagnosis not present

## 2021-01-30 DIAGNOSIS — N2581 Secondary hyperparathyroidism of renal origin: Secondary | ICD-10-CM | POA: Diagnosis not present

## 2021-01-30 DIAGNOSIS — Z992 Dependence on renal dialysis: Secondary | ICD-10-CM | POA: Diagnosis not present

## 2021-01-30 DIAGNOSIS — D689 Coagulation defect, unspecified: Secondary | ICD-10-CM | POA: Diagnosis not present

## 2021-01-30 DIAGNOSIS — E119 Type 2 diabetes mellitus without complications: Secondary | ICD-10-CM | POA: Diagnosis not present

## 2021-01-30 DIAGNOSIS — D631 Anemia in chronic kidney disease: Secondary | ICD-10-CM | POA: Diagnosis not present

## 2021-01-30 DIAGNOSIS — D509 Iron deficiency anemia, unspecified: Secondary | ICD-10-CM | POA: Diagnosis not present

## 2021-01-30 DIAGNOSIS — E039 Hypothyroidism, unspecified: Secondary | ICD-10-CM | POA: Diagnosis not present

## 2021-02-02 DIAGNOSIS — D631 Anemia in chronic kidney disease: Secondary | ICD-10-CM | POA: Diagnosis not present

## 2021-02-02 DIAGNOSIS — E039 Hypothyroidism, unspecified: Secondary | ICD-10-CM | POA: Diagnosis not present

## 2021-02-02 DIAGNOSIS — N186 End stage renal disease: Secondary | ICD-10-CM | POA: Diagnosis not present

## 2021-02-02 DIAGNOSIS — Z992 Dependence on renal dialysis: Secondary | ICD-10-CM | POA: Diagnosis not present

## 2021-02-02 DIAGNOSIS — N2581 Secondary hyperparathyroidism of renal origin: Secondary | ICD-10-CM | POA: Diagnosis not present

## 2021-02-02 DIAGNOSIS — D689 Coagulation defect, unspecified: Secondary | ICD-10-CM | POA: Diagnosis not present

## 2021-02-02 DIAGNOSIS — E119 Type 2 diabetes mellitus without complications: Secondary | ICD-10-CM | POA: Diagnosis not present

## 2021-02-02 DIAGNOSIS — D509 Iron deficiency anemia, unspecified: Secondary | ICD-10-CM | POA: Diagnosis not present

## 2021-02-04 DIAGNOSIS — E119 Type 2 diabetes mellitus without complications: Secondary | ICD-10-CM | POA: Diagnosis not present

## 2021-02-04 DIAGNOSIS — E039 Hypothyroidism, unspecified: Secondary | ICD-10-CM | POA: Diagnosis not present

## 2021-02-04 DIAGNOSIS — N186 End stage renal disease: Secondary | ICD-10-CM | POA: Diagnosis not present

## 2021-02-04 DIAGNOSIS — D509 Iron deficiency anemia, unspecified: Secondary | ICD-10-CM | POA: Diagnosis not present

## 2021-02-04 DIAGNOSIS — D689 Coagulation defect, unspecified: Secondary | ICD-10-CM | POA: Diagnosis not present

## 2021-02-04 DIAGNOSIS — D631 Anemia in chronic kidney disease: Secondary | ICD-10-CM | POA: Diagnosis not present

## 2021-02-04 DIAGNOSIS — N2581 Secondary hyperparathyroidism of renal origin: Secondary | ICD-10-CM | POA: Diagnosis not present

## 2021-02-04 DIAGNOSIS — Z992 Dependence on renal dialysis: Secondary | ICD-10-CM | POA: Diagnosis not present

## 2021-02-05 ENCOUNTER — Other Ambulatory Visit: Payer: Self-pay

## 2021-02-05 ENCOUNTER — Ambulatory Visit: Payer: Medicare Other | Admitting: Endocrinology

## 2021-02-05 VITALS — BP 160/82 | HR 97 | Ht 62.0 in | Wt 121.0 lb

## 2021-02-05 DIAGNOSIS — E113599 Type 2 diabetes mellitus with proliferative diabetic retinopathy without macular edema, unspecified eye: Secondary | ICD-10-CM

## 2021-02-05 LAB — POCT GLYCOSYLATED HEMOGLOBIN (HGB A1C): Hemoglobin A1C: 5.9 % — AB (ref 4.0–5.6)

## 2021-02-05 NOTE — Patient Instructions (Addendum)
A different type of diabetes blood test is requested for you today.  We'll let you know about the results.  check your blood sugar once a day.  vary the time of day when you check, between before the 3 meals, and at bedtime.  also check if you have symptoms of your blood sugar being too high or too low.  please keep a record of the readings and bring it to your next appointment here (or you can bring the meter itself).  You can write it on any piece of paper.  please call us sooner if your blood sugar goes below 70, or if you have a lot of readings over 200.   Please come back for a follow-up appointment in 4 months.

## 2021-02-05 NOTE — Progress Notes (Signed)
Subjective:    Patient ID: [name], female    DOB: 1950/12/28, 71 y.o.   MRN: [id number]  HPI Pt returns for f/u of diabetes mellitus:  DM type: 2, but she may be developing type 1.   Dx'ed: 2007 Complications: polyneuropathy, ESRD, CVA's, and PDR.   Therapy: 3 oral meds GDM: never DKA: never Severe hypoglycemia: once (2021).   Pancreatitis: never Pancreatic imaging: never. Other: she cannot afford brand-name meds; she has never been on insulin; edema limits rx options; fructosamine indicates much higher avg glucose than A1c.   Interval history: no cbg record, but states cbg's vary from 84-200.  It is in general higher as the day goes on  pt states she feels well in general.   She again declines to start insulin.    Past Medical History:  Diagnosis Date   Acute kidney injury ([facility]) 01/08/2015   Arthritis    Chronic back pain    Constipation    Cough    Diabetes mellitus, type 2 ([facility])    Diverticulosis    Fibroid    patient thinks this was the reason for her hysterectomy   GERD (gastroesophageal reflux disease)    Heart murmur    History of sebaceous cyst    Hyperlipemia    Hyperplastic colon polyp    Hypertension    Hyperthyroidism    Lumbar radiculopathy    Shortness of breath 09/13/2013   Stroke ([facility]) 2015?   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    Past Surgical History:  Procedure Laterality Date   ABDOMINAL HYSTERECTOMY     --?ovaries remain   AV FISTULA PLACEMENT Left 07/16/2020   Procedure: LEFT BRACHIOCEPHALIC ARTERIOVENOUS (AV) FISTULA CREATION;  Surgeon: Leonie Douglas, MD;  Location: [facility];  Service: Vascular;  Laterality: Left;  PERIPHERAL NERVE BLOCK   COLONOSCOPY W/ BIOPSIES  09/11/2008   ESOPHAGOGASTRODUODENOSCOPY (EGD) WITH PROPOFOL N/A 05/20/2020   Procedure: ESOPHAGOGASTRODUODENOSCOPY (EGD) WITH PROPOFOL;  Surgeon: Hilarie Fredrickson, MD;  Location: [facility] ENDOSCOPY;  Service: Endoscopy;   Laterality: N/A;   GIVENS CAPSULE STUDY N/A 06/28/2020   Procedure: GIVENS CAPSULE STUDY;  Surgeon: Hilarie Fredrickson, MD;  [facility] END[facility]COPY;  Service: Endoscopy;  Laterality: N/A;   RETINOPATHY SURGERY Bilateral 2013   Dr. Ashley Royalty    Social History   Socioeconomic History   Marital status: Divorced    Spouse name: Not on file   Number of children: 1   Years of education: 11   Highest education level: Not on file  Occupational History   Occupation: Engineer, drilling: [employer]    Comment: Retired  Tobacco Use   Smoking status: Former    Packs/day: 0.50    Years: 35.00    Pack years: 17.50    Types: Cigarettes    Quit date: 07/07/2014    Years since quitting: 6.5   Smokeless tobacco: Never  Vaping Use   Vaping Use: Never used  Substance and Sexual Activity   Alcohol use: No   Drug use: No   Sexual activity: Never    Partners: Male    Birth control/protection: Surgical    Comment: TAH--?ovaries remain  Other Topics Concern   Not on file  Social History Narrative   Divorced 1 son    Retired Administrator, sports for [employer]   Former smoker no alcohol caffeine or drug use report  denies abuse and feels safe at home.   Social Determinants of Health   Financial Resource Strain: Not on file  Food Insecurity: Not on file  Transportation Needs: Not on file  Physical Activity: Not on file  Stress: Not on file  Social Connections: Not on file  Intimate Partner Violence: Not on file    Current Outpatient Medications on File Prior to Visit  Medication Sig Dispense Refill   acarbose (PRECOSE) 25 MG tablet TAKE 1/2 TABLETS (12.5 MG TOTAL) BY MOUTH 3 (THREE) TIMES DAILY WITH MEALS. 135 tablet 1   acetaminophen (TYLENOL) 500 MG tablet Take 1,000 mg by mouth every 6 (six) hours as needed for moderate pain.     allopurinol (ZYLOPRIM) 100 MG tablet Take 1 tablet (100 mg total) by mouth daily. 30 tablet 0   amLODipine (NORVASC) 10 MG tablet TAKE 1 TABLET  BY MOUTH EVERY DAY (Patient taking differently: Take 10 mg by mouth daily.) 90 tablet 0   atorvastatin (LIPITOR) 80 MG tablet TAKE 1 TABLET BY MOUTH EVERY DAY (Patient taking differently: Take 80 mg by mouth daily.) 90 tablet 1   B Complex-C-Folic Acid (DIALYVITE 800 PO) Take 1 tablet by mouth daily.     Blood Glucose Monitoring Suppl (ONETOUCH VERIO FLEX SYSTEM) w/Device KIT 1 each by Does not apply route 2 (two) times daily. E11.9     bromocriptine (PARLODEL) 2.5 MG tablet Take 0.5 tablets (1.25 mg total) by mouth daily. 45 tablet 1   Calcium Acetate 667 MG TABS Take 1 tablet by mouth daily.     epoetin alfa-epbx (RETACRIT) 85110 UNIT/ML injection Inject 40,000 Units into the skin every 28 (twenty-eight) days.     glucose blood (ONETOUCH VERIO) test strip USE AS DIRECTED TWICE DAILY 200 strip 3   isosorbide mononitrate (IMDUR) 30 MG 24 hr tablet Take 1 tablet (30 mg total) by mouth daily. 30 tablet 0   metoprolol succinate (TOPROL-XL) 100 MG 24 hr tablet Take 100 mg by mouth daily.     minoxidil (LONITEN) 10 MG tablet Take 10 mg by mouth 2 (two) times daily.     OneTouch Delica Lancets 33G MISC 1 each by Does not apply route 2 (two) times daily. E11.9     oxyCODONE-acetaminophen (PERCOCET) 5-325 MG tablet Take 1 tablet by mouth every 6 (six) hours as needed for severe pain. 8 tablet 0   pantoprazole (PROTONIX) 40 MG tablet Take 1 tablet (40 mg total) by mouth daily. 90 tablet 3   polyethylene glycol (MIRALAX / GLYCOLAX) 17 g packet Take 34-51 g by mouth daily as needed for mild constipation.     repaglinide (PRANDIN) 2 MG tablet Take 1 tablet (2 mg total) by mouth 2 (two) times daily before a meal. 180 tablet 3   hydrALAZINE (APRESOLINE) 50 MG tablet Take 1 tablet (50 mg total) by mouth every 8 (eight) hours. (Patient taking differently: Take 50 mg by mouth daily.) 90 tablet 0   No current facility-administered medications on file prior to visit.    Allergies  Allergen Reactions    Semaglutide Rash    Rybelsus 3 mg    Family History  Problem Relation Age of Onset   Hypertension Mother    Sleep apnea Mother    Diabetes Mother    Hyperlipidemia Mother    Lung cancer Father        smoker   Hypertension Sister    Diabetes Sister    Hypertension Brother    Hypertension Sister  Diabetes Brother    Hypertension Brother    Breast cancer Neg Hx    Colon cancer Neg Hx    Esophageal cancer Neg Hx    Pancreatic cancer Neg Hx    Liver disease Neg Hx    Colon polyps Neg Hx     BP (!) 160/82    Pulse 97    Ht $R'5\' 2"'YH$  (1.575 m)    Wt 121 lb (54.9 kg)    SpO2 99%    BMI 22.13 kg/m    Review of Systems     Objective:   Physical Exam   Lab Results  Component Value Date   HGBA1C 5.9 (A) 02/05/2021      Assessment & Plan:  Type 2 DM: uncertain glycemic control ESRD: check fructosamine  Patient Instructions  A different type of diabetes blood test is requested for you today.  We'll let you know about the results.  check your blood sugar once a day.  vary the time of day when you check, between before the 3 meals, and at bedtime.  also check if you have symptoms of your blood sugar being too high or too low.  please keep a record of the readings and bring it to your next appointment here (or you can bring the meter itself).  You can write it on any piece of paper.  please call us sooner if your blood sugar goes below 70, or if you have a lot of readings over 200.   Please come back for a follow-up appointment in 4 months.

## 2021-02-06 DIAGNOSIS — D631 Anemia in chronic kidney disease: Secondary | ICD-10-CM | POA: Diagnosis not present

## 2021-02-06 DIAGNOSIS — D689 Coagulation defect, unspecified: Secondary | ICD-10-CM | POA: Diagnosis not present

## 2021-02-06 DIAGNOSIS — E119 Type 2 diabetes mellitus without complications: Secondary | ICD-10-CM | POA: Diagnosis not present

## 2021-02-06 DIAGNOSIS — N2581 Secondary hyperparathyroidism of renal origin: Secondary | ICD-10-CM | POA: Diagnosis not present

## 2021-02-06 DIAGNOSIS — Z992 Dependence on renal dialysis: Secondary | ICD-10-CM | POA: Diagnosis not present

## 2021-02-06 DIAGNOSIS — D509 Iron deficiency anemia, unspecified: Secondary | ICD-10-CM | POA: Diagnosis not present

## 2021-02-06 DIAGNOSIS — N186 End stage renal disease: Secondary | ICD-10-CM | POA: Diagnosis not present

## 2021-02-06 DIAGNOSIS — E039 Hypothyroidism, unspecified: Secondary | ICD-10-CM | POA: Diagnosis not present

## 2021-02-09 DIAGNOSIS — D631 Anemia in chronic kidney disease: Secondary | ICD-10-CM | POA: Diagnosis not present

## 2021-02-09 DIAGNOSIS — E119 Type 2 diabetes mellitus without complications: Secondary | ICD-10-CM | POA: Diagnosis not present

## 2021-02-09 DIAGNOSIS — N186 End stage renal disease: Secondary | ICD-10-CM | POA: Diagnosis not present

## 2021-02-09 DIAGNOSIS — N2581 Secondary hyperparathyroidism of renal origin: Secondary | ICD-10-CM | POA: Diagnosis not present

## 2021-02-09 DIAGNOSIS — D509 Iron deficiency anemia, unspecified: Secondary | ICD-10-CM | POA: Diagnosis not present

## 2021-02-09 DIAGNOSIS — E039 Hypothyroidism, unspecified: Secondary | ICD-10-CM | POA: Diagnosis not present

## 2021-02-09 DIAGNOSIS — D689 Coagulation defect, unspecified: Secondary | ICD-10-CM | POA: Diagnosis not present

## 2021-02-09 DIAGNOSIS — Z992 Dependence on renal dialysis: Secondary | ICD-10-CM | POA: Diagnosis not present

## 2021-02-10 LAB — FRUCTOSAMINE: Fructosamine: 382 umol/L — ABNORMAL HIGH (ref 205–285)

## 2021-02-11 ENCOUNTER — Other Ambulatory Visit: Payer: Self-pay | Admitting: Endocrinology

## 2021-02-11 DIAGNOSIS — D509 Iron deficiency anemia, unspecified: Secondary | ICD-10-CM | POA: Diagnosis not present

## 2021-02-11 DIAGNOSIS — E039 Hypothyroidism, unspecified: Secondary | ICD-10-CM | POA: Diagnosis not present

## 2021-02-11 DIAGNOSIS — N186 End stage renal disease: Secondary | ICD-10-CM | POA: Diagnosis not present

## 2021-02-11 DIAGNOSIS — U071 COVID-19: Secondary | ICD-10-CM | POA: Diagnosis not present

## 2021-02-11 DIAGNOSIS — D689 Coagulation defect, unspecified: Secondary | ICD-10-CM | POA: Diagnosis not present

## 2021-02-11 DIAGNOSIS — N2581 Secondary hyperparathyroidism of renal origin: Secondary | ICD-10-CM | POA: Diagnosis not present

## 2021-02-11 DIAGNOSIS — D631 Anemia in chronic kidney disease: Secondary | ICD-10-CM | POA: Diagnosis not present

## 2021-02-11 DIAGNOSIS — E119 Type 2 diabetes mellitus without complications: Secondary | ICD-10-CM | POA: Diagnosis not present

## 2021-02-11 DIAGNOSIS — Z992 Dependence on renal dialysis: Secondary | ICD-10-CM | POA: Diagnosis not present

## 2021-02-11 MED ORDER — SITAGLIPTIN PHOSPHATE 100 MG PO TABS: 100.0000 mg | ORAL_TABLET | Freq: Every day | ORAL | 3 refills | Status: DC

## 2021-02-13 DIAGNOSIS — D689 Coagulation defect, unspecified: Secondary | ICD-10-CM | POA: Diagnosis not present

## 2021-02-13 DIAGNOSIS — D509 Iron deficiency anemia, unspecified: Secondary | ICD-10-CM | POA: Diagnosis not present

## 2021-02-13 DIAGNOSIS — N2581 Secondary hyperparathyroidism of renal origin: Secondary | ICD-10-CM | POA: Diagnosis not present

## 2021-02-13 DIAGNOSIS — Z992 Dependence on renal dialysis: Secondary | ICD-10-CM | POA: Diagnosis not present

## 2021-02-13 DIAGNOSIS — D631 Anemia in chronic kidney disease: Secondary | ICD-10-CM | POA: Diagnosis not present

## 2021-02-13 DIAGNOSIS — E119 Type 2 diabetes mellitus without complications: Secondary | ICD-10-CM | POA: Diagnosis not present

## 2021-02-13 DIAGNOSIS — N186 End stage renal disease: Secondary | ICD-10-CM | POA: Diagnosis not present

## 2021-02-13 DIAGNOSIS — E039 Hypothyroidism, unspecified: Secondary | ICD-10-CM | POA: Diagnosis not present

## 2021-02-16 DIAGNOSIS — N2581 Secondary hyperparathyroidism of renal origin: Secondary | ICD-10-CM | POA: Diagnosis not present

## 2021-02-16 DIAGNOSIS — D689 Coagulation defect, unspecified: Secondary | ICD-10-CM | POA: Diagnosis not present

## 2021-02-16 DIAGNOSIS — Z992 Dependence on renal dialysis: Secondary | ICD-10-CM | POA: Diagnosis not present

## 2021-02-16 DIAGNOSIS — N186 End stage renal disease: Secondary | ICD-10-CM | POA: Diagnosis not present

## 2021-02-16 DIAGNOSIS — D509 Iron deficiency anemia, unspecified: Secondary | ICD-10-CM | POA: Diagnosis not present

## 2021-02-16 DIAGNOSIS — E119 Type 2 diabetes mellitus without complications: Secondary | ICD-10-CM | POA: Diagnosis not present

## 2021-02-16 DIAGNOSIS — D631 Anemia in chronic kidney disease: Secondary | ICD-10-CM | POA: Diagnosis not present

## 2021-02-16 DIAGNOSIS — E039 Hypothyroidism, unspecified: Secondary | ICD-10-CM | POA: Diagnosis not present

## 2021-02-18 DIAGNOSIS — N186 End stage renal disease: Secondary | ICD-10-CM | POA: Diagnosis not present

## 2021-02-18 DIAGNOSIS — E039 Hypothyroidism, unspecified: Secondary | ICD-10-CM | POA: Diagnosis not present

## 2021-02-18 DIAGNOSIS — E119 Type 2 diabetes mellitus without complications: Secondary | ICD-10-CM | POA: Diagnosis not present

## 2021-02-18 DIAGNOSIS — D631 Anemia in chronic kidney disease: Secondary | ICD-10-CM | POA: Diagnosis not present

## 2021-02-18 DIAGNOSIS — D509 Iron deficiency anemia, unspecified: Secondary | ICD-10-CM | POA: Diagnosis not present

## 2021-02-18 DIAGNOSIS — Z992 Dependence on renal dialysis: Secondary | ICD-10-CM | POA: Diagnosis not present

## 2021-02-18 DIAGNOSIS — D689 Coagulation defect, unspecified: Secondary | ICD-10-CM | POA: Diagnosis not present

## 2021-02-18 DIAGNOSIS — N2581 Secondary hyperparathyroidism of renal origin: Secondary | ICD-10-CM | POA: Diagnosis not present

## 2021-02-20 DIAGNOSIS — E039 Hypothyroidism, unspecified: Secondary | ICD-10-CM | POA: Diagnosis not present

## 2021-02-20 DIAGNOSIS — N2581 Secondary hyperparathyroidism of renal origin: Secondary | ICD-10-CM | POA: Diagnosis not present

## 2021-02-20 DIAGNOSIS — D631 Anemia in chronic kidney disease: Secondary | ICD-10-CM | POA: Diagnosis not present

## 2021-02-20 DIAGNOSIS — N186 End stage renal disease: Secondary | ICD-10-CM | POA: Diagnosis not present

## 2021-02-20 DIAGNOSIS — D509 Iron deficiency anemia, unspecified: Secondary | ICD-10-CM | POA: Diagnosis not present

## 2021-02-20 DIAGNOSIS — Z992 Dependence on renal dialysis: Secondary | ICD-10-CM | POA: Diagnosis not present

## 2021-02-20 DIAGNOSIS — D689 Coagulation defect, unspecified: Secondary | ICD-10-CM | POA: Diagnosis not present

## 2021-02-20 DIAGNOSIS — E119 Type 2 diabetes mellitus without complications: Secondary | ICD-10-CM | POA: Diagnosis not present

## 2021-02-23 DIAGNOSIS — N186 End stage renal disease: Secondary | ICD-10-CM | POA: Diagnosis not present

## 2021-02-23 DIAGNOSIS — D509 Iron deficiency anemia, unspecified: Secondary | ICD-10-CM | POA: Diagnosis not present

## 2021-02-23 DIAGNOSIS — E119 Type 2 diabetes mellitus without complications: Secondary | ICD-10-CM | POA: Diagnosis not present

## 2021-02-23 DIAGNOSIS — Z992 Dependence on renal dialysis: Secondary | ICD-10-CM | POA: Diagnosis not present

## 2021-02-23 DIAGNOSIS — I129 Hypertensive chronic kidney disease with stage 1 through stage 4 chronic kidney disease, or unspecified chronic kidney disease: Secondary | ICD-10-CM | POA: Diagnosis not present

## 2021-02-23 DIAGNOSIS — D689 Coagulation defect, unspecified: Secondary | ICD-10-CM | POA: Diagnosis not present

## 2021-02-23 DIAGNOSIS — N2581 Secondary hyperparathyroidism of renal origin: Secondary | ICD-10-CM | POA: Diagnosis not present

## 2021-02-23 DIAGNOSIS — D631 Anemia in chronic kidney disease: Secondary | ICD-10-CM | POA: Diagnosis not present

## 2021-02-23 DIAGNOSIS — E039 Hypothyroidism, unspecified: Secondary | ICD-10-CM | POA: Diagnosis not present

## 2021-02-25 DIAGNOSIS — Z992 Dependence on renal dialysis: Secondary | ICD-10-CM | POA: Diagnosis not present

## 2021-02-25 DIAGNOSIS — D689 Coagulation defect, unspecified: Secondary | ICD-10-CM | POA: Diagnosis not present

## 2021-02-25 DIAGNOSIS — D631 Anemia in chronic kidney disease: Secondary | ICD-10-CM | POA: Diagnosis not present

## 2021-02-25 DIAGNOSIS — N186 End stage renal disease: Secondary | ICD-10-CM | POA: Diagnosis not present

## 2021-02-25 DIAGNOSIS — R52 Pain, unspecified: Secondary | ICD-10-CM | POA: Diagnosis not present

## 2021-02-25 DIAGNOSIS — E119 Type 2 diabetes mellitus without complications: Secondary | ICD-10-CM | POA: Diagnosis not present

## 2021-02-25 DIAGNOSIS — N2581 Secondary hyperparathyroidism of renal origin: Secondary | ICD-10-CM | POA: Diagnosis not present

## 2021-02-25 DIAGNOSIS — Z23 Encounter for immunization: Secondary | ICD-10-CM | POA: Diagnosis not present

## 2021-02-25 DIAGNOSIS — D509 Iron deficiency anemia, unspecified: Secondary | ICD-10-CM | POA: Diagnosis not present

## 2021-02-27 DIAGNOSIS — D631 Anemia in chronic kidney disease: Secondary | ICD-10-CM | POA: Diagnosis not present

## 2021-02-27 DIAGNOSIS — N2581 Secondary hyperparathyroidism of renal origin: Secondary | ICD-10-CM | POA: Diagnosis not present

## 2021-02-27 DIAGNOSIS — Z23 Encounter for immunization: Secondary | ICD-10-CM | POA: Diagnosis not present

## 2021-02-27 DIAGNOSIS — N186 End stage renal disease: Secondary | ICD-10-CM | POA: Diagnosis not present

## 2021-02-27 DIAGNOSIS — E119 Type 2 diabetes mellitus without complications: Secondary | ICD-10-CM | POA: Diagnosis not present

## 2021-02-27 DIAGNOSIS — D689 Coagulation defect, unspecified: Secondary | ICD-10-CM | POA: Diagnosis not present

## 2021-02-27 DIAGNOSIS — Z992 Dependence on renal dialysis: Secondary | ICD-10-CM | POA: Diagnosis not present

## 2021-02-27 DIAGNOSIS — R52 Pain, unspecified: Secondary | ICD-10-CM | POA: Diagnosis not present

## 2021-02-27 DIAGNOSIS — D509 Iron deficiency anemia, unspecified: Secondary | ICD-10-CM | POA: Diagnosis not present

## 2021-03-02 DIAGNOSIS — E119 Type 2 diabetes mellitus without complications: Secondary | ICD-10-CM | POA: Diagnosis not present

## 2021-03-02 DIAGNOSIS — R52 Pain, unspecified: Secondary | ICD-10-CM | POA: Diagnosis not present

## 2021-03-02 DIAGNOSIS — N186 End stage renal disease: Secondary | ICD-10-CM | POA: Diagnosis not present

## 2021-03-02 DIAGNOSIS — Z23 Encounter for immunization: Secondary | ICD-10-CM | POA: Diagnosis not present

## 2021-03-02 DIAGNOSIS — Z992 Dependence on renal dialysis: Secondary | ICD-10-CM | POA: Diagnosis not present

## 2021-03-02 DIAGNOSIS — D631 Anemia in chronic kidney disease: Secondary | ICD-10-CM | POA: Diagnosis not present

## 2021-03-02 DIAGNOSIS — D689 Coagulation defect, unspecified: Secondary | ICD-10-CM | POA: Diagnosis not present

## 2021-03-02 DIAGNOSIS — D509 Iron deficiency anemia, unspecified: Secondary | ICD-10-CM | POA: Diagnosis not present

## 2021-03-02 DIAGNOSIS — N2581 Secondary hyperparathyroidism of renal origin: Secondary | ICD-10-CM | POA: Diagnosis not present

## 2021-03-03 ENCOUNTER — Ambulatory Visit (INDEPENDENT_AMBULATORY_CARE_PROVIDER_SITE_OTHER): Payer: Medicare Other | Admitting: Podiatry

## 2021-03-03 ENCOUNTER — Other Ambulatory Visit: Payer: Self-pay

## 2021-03-03 ENCOUNTER — Encounter: Payer: Self-pay | Admitting: Podiatry

## 2021-03-03 DIAGNOSIS — M79674 Pain in right toe(s): Secondary | ICD-10-CM

## 2021-03-03 DIAGNOSIS — M79675 Pain in left toe(s): Secondary | ICD-10-CM

## 2021-03-03 DIAGNOSIS — B351 Tinea unguium: Secondary | ICD-10-CM

## 2021-03-03 DIAGNOSIS — E1151 Type 2 diabetes mellitus with diabetic peripheral angiopathy without gangrene: Secondary | ICD-10-CM | POA: Diagnosis not present

## 2021-03-03 DIAGNOSIS — N184 Chronic kidney disease, stage 4 (severe): Secondary | ICD-10-CM

## 2021-03-03 NOTE — Progress Notes (Signed)
This patient returns to my office for at risk foot care.  This patient requires this care by a professional since this patient will be at risk due to having type 2 diabetes and CKD.    This patient is unable to cut nails herself since the patient cannot reach her nails.These nails are painful walking and wearing shoes.  This patient presents for at risk foot care today.  General Appearance  Alert, conversant and in no acute stress.  Vascular  Dorsalis pedis and posterior tibial  pulses are weakly  palpable  bilaterally.  Capillary return is within normal limits  bilaterally. Cold feet   Bilaterally.  Absent digital hair  B/L.  Neurologic  Senn-Weinstein monofilament wire test within normal limits  bilaterally. Muscle power within normal limits bilaterally.  Nails Thick disfigured discolored nails with subungual debris  from hallux to fifth toes bilaterally. No evidence of bacterial infection or drainage bilaterally.  Orthopedic  No limitations of motion  feet .  No crepitus or effusions noted.  No bony pathology or digital deformities noted.  Skin  normotropic skin with no porokeratosis noted bilaterally.  No signs of infections or ulcers noted.     Onychomycosis  Pain in right toes  Pain in left toes  Consent was obtained for treatment procedures.   Mechanical debridement of nails 1-5  bilaterally performed with a nail nipper.  Filed with dremel without incident.    Return office visit    3 months                  Told patient to return for periodic foot care and evaluation due to potential at risk complications.   Cartrell Bentsen DPM  

## 2021-03-04 DIAGNOSIS — N186 End stage renal disease: Secondary | ICD-10-CM | POA: Diagnosis not present

## 2021-03-04 DIAGNOSIS — E119 Type 2 diabetes mellitus without complications: Secondary | ICD-10-CM | POA: Diagnosis not present

## 2021-03-04 DIAGNOSIS — Z992 Dependence on renal dialysis: Secondary | ICD-10-CM | POA: Diagnosis not present

## 2021-03-04 DIAGNOSIS — Z23 Encounter for immunization: Secondary | ICD-10-CM | POA: Diagnosis not present

## 2021-03-04 DIAGNOSIS — N2581 Secondary hyperparathyroidism of renal origin: Secondary | ICD-10-CM | POA: Diagnosis not present

## 2021-03-04 DIAGNOSIS — D689 Coagulation defect, unspecified: Secondary | ICD-10-CM | POA: Diagnosis not present

## 2021-03-04 DIAGNOSIS — D509 Iron deficiency anemia, unspecified: Secondary | ICD-10-CM | POA: Diagnosis not present

## 2021-03-04 DIAGNOSIS — R52 Pain, unspecified: Secondary | ICD-10-CM | POA: Diagnosis not present

## 2021-03-04 DIAGNOSIS — D631 Anemia in chronic kidney disease: Secondary | ICD-10-CM | POA: Diagnosis not present

## 2021-03-05 DIAGNOSIS — R208 Other disturbances of skin sensation: Secondary | ICD-10-CM | POA: Diagnosis not present

## 2021-03-05 DIAGNOSIS — M25512 Pain in left shoulder: Secondary | ICD-10-CM | POA: Diagnosis not present

## 2021-03-05 DIAGNOSIS — M19011 Primary osteoarthritis, right shoulder: Secondary | ICD-10-CM | POA: Diagnosis not present

## 2021-03-05 DIAGNOSIS — M25511 Pain in right shoulder: Secondary | ICD-10-CM | POA: Diagnosis not present

## 2021-03-05 DIAGNOSIS — M19012 Primary osteoarthritis, left shoulder: Secondary | ICD-10-CM | POA: Diagnosis not present

## 2021-03-06 DIAGNOSIS — R52 Pain, unspecified: Secondary | ICD-10-CM | POA: Diagnosis not present

## 2021-03-06 DIAGNOSIS — E119 Type 2 diabetes mellitus without complications: Secondary | ICD-10-CM | POA: Diagnosis not present

## 2021-03-06 DIAGNOSIS — N2581 Secondary hyperparathyroidism of renal origin: Secondary | ICD-10-CM | POA: Diagnosis not present

## 2021-03-06 DIAGNOSIS — N186 End stage renal disease: Secondary | ICD-10-CM | POA: Diagnosis not present

## 2021-03-06 DIAGNOSIS — Z23 Encounter for immunization: Secondary | ICD-10-CM | POA: Diagnosis not present

## 2021-03-06 DIAGNOSIS — D631 Anemia in chronic kidney disease: Secondary | ICD-10-CM | POA: Diagnosis not present

## 2021-03-06 DIAGNOSIS — D509 Iron deficiency anemia, unspecified: Secondary | ICD-10-CM | POA: Diagnosis not present

## 2021-03-06 DIAGNOSIS — Z992 Dependence on renal dialysis: Secondary | ICD-10-CM | POA: Diagnosis not present

## 2021-03-06 DIAGNOSIS — D689 Coagulation defect, unspecified: Secondary | ICD-10-CM | POA: Diagnosis not present

## 2021-03-08 DIAGNOSIS — I871 Compression of vein: Secondary | ICD-10-CM | POA: Diagnosis not present

## 2021-03-08 DIAGNOSIS — T82858A Stenosis of vascular prosthetic devices, implants and grafts, initial encounter: Secondary | ICD-10-CM | POA: Diagnosis not present

## 2021-03-08 DIAGNOSIS — N186 End stage renal disease: Secondary | ICD-10-CM | POA: Diagnosis not present

## 2021-03-08 DIAGNOSIS — Z992 Dependence on renal dialysis: Secondary | ICD-10-CM | POA: Diagnosis not present

## 2021-03-09 DIAGNOSIS — D689 Coagulation defect, unspecified: Secondary | ICD-10-CM | POA: Diagnosis not present

## 2021-03-09 DIAGNOSIS — E119 Type 2 diabetes mellitus without complications: Secondary | ICD-10-CM | POA: Diagnosis not present

## 2021-03-09 DIAGNOSIS — Z23 Encounter for immunization: Secondary | ICD-10-CM | POA: Diagnosis not present

## 2021-03-09 DIAGNOSIS — D509 Iron deficiency anemia, unspecified: Secondary | ICD-10-CM | POA: Diagnosis not present

## 2021-03-09 DIAGNOSIS — Z992 Dependence on renal dialysis: Secondary | ICD-10-CM | POA: Diagnosis not present

## 2021-03-09 DIAGNOSIS — N2581 Secondary hyperparathyroidism of renal origin: Secondary | ICD-10-CM | POA: Diagnosis not present

## 2021-03-09 DIAGNOSIS — R52 Pain, unspecified: Secondary | ICD-10-CM | POA: Diagnosis not present

## 2021-03-09 DIAGNOSIS — N186 End stage renal disease: Secondary | ICD-10-CM | POA: Diagnosis not present

## 2021-03-09 DIAGNOSIS — D631 Anemia in chronic kidney disease: Secondary | ICD-10-CM | POA: Diagnosis not present

## 2021-03-11 DIAGNOSIS — N186 End stage renal disease: Secondary | ICD-10-CM | POA: Diagnosis not present

## 2021-03-11 DIAGNOSIS — Z23 Encounter for immunization: Secondary | ICD-10-CM | POA: Diagnosis not present

## 2021-03-11 DIAGNOSIS — R52 Pain, unspecified: Secondary | ICD-10-CM | POA: Diagnosis not present

## 2021-03-11 DIAGNOSIS — D689 Coagulation defect, unspecified: Secondary | ICD-10-CM | POA: Diagnosis not present

## 2021-03-11 DIAGNOSIS — D509 Iron deficiency anemia, unspecified: Secondary | ICD-10-CM | POA: Diagnosis not present

## 2021-03-11 DIAGNOSIS — D631 Anemia in chronic kidney disease: Secondary | ICD-10-CM | POA: Diagnosis not present

## 2021-03-11 DIAGNOSIS — N2581 Secondary hyperparathyroidism of renal origin: Secondary | ICD-10-CM | POA: Diagnosis not present

## 2021-03-11 DIAGNOSIS — E119 Type 2 diabetes mellitus without complications: Secondary | ICD-10-CM | POA: Diagnosis not present

## 2021-03-11 DIAGNOSIS — Z992 Dependence on renal dialysis: Secondary | ICD-10-CM | POA: Diagnosis not present

## 2021-03-12 DIAGNOSIS — Z992 Dependence on renal dialysis: Secondary | ICD-10-CM

## 2021-03-12 DIAGNOSIS — M1A3620 Chronic gout due to renal impairment, left knee, without tophus (tophi): Secondary | ICD-10-CM | POA: Diagnosis not present

## 2021-03-12 DIAGNOSIS — E1122 Type 2 diabetes mellitus with diabetic chronic kidney disease: Secondary | ICD-10-CM | POA: Diagnosis not present

## 2021-03-12 DIAGNOSIS — Z780 Asymptomatic menopausal state: Secondary | ICD-10-CM | POA: Diagnosis not present

## 2021-03-12 DIAGNOSIS — N186 End stage renal disease: Secondary | ICD-10-CM | POA: Diagnosis not present

## 2021-03-12 DIAGNOSIS — E785 Hyperlipidemia, unspecified: Secondary | ICD-10-CM | POA: Diagnosis not present

## 2021-03-12 DIAGNOSIS — Z87891 Personal history of nicotine dependence: Secondary | ICD-10-CM | POA: Diagnosis not present

## 2021-03-12 DIAGNOSIS — I120 Hypertensive chronic kidney disease with stage 5 chronic kidney disease or end stage renal disease: Secondary | ICD-10-CM | POA: Diagnosis not present

## 2021-03-12 DIAGNOSIS — Z0001 Encounter for general adult medical examination with abnormal findings: Secondary | ICD-10-CM | POA: Diagnosis not present

## 2021-03-12 DIAGNOSIS — Z90710 Acquired absence of both cervix and uterus: Secondary | ICD-10-CM | POA: Insufficient documentation

## 2021-03-12 DIAGNOSIS — Z7682 Awaiting organ transplant status: Secondary | ICD-10-CM | POA: Diagnosis not present

## 2021-03-12 DIAGNOSIS — Z0000 Encounter for general adult medical examination without abnormal findings: Secondary | ICD-10-CM | POA: Diagnosis not present

## 2021-03-13 DIAGNOSIS — N186 End stage renal disease: Secondary | ICD-10-CM | POA: Diagnosis not present

## 2021-03-13 DIAGNOSIS — D689 Coagulation defect, unspecified: Secondary | ICD-10-CM | POA: Diagnosis not present

## 2021-03-13 DIAGNOSIS — D631 Anemia in chronic kidney disease: Secondary | ICD-10-CM | POA: Diagnosis not present

## 2021-03-13 DIAGNOSIS — R52 Pain, unspecified: Secondary | ICD-10-CM | POA: Diagnosis not present

## 2021-03-13 DIAGNOSIS — E119 Type 2 diabetes mellitus without complications: Secondary | ICD-10-CM | POA: Diagnosis not present

## 2021-03-13 DIAGNOSIS — Z992 Dependence on renal dialysis: Secondary | ICD-10-CM | POA: Diagnosis not present

## 2021-03-13 DIAGNOSIS — Z23 Encounter for immunization: Secondary | ICD-10-CM | POA: Diagnosis not present

## 2021-03-13 DIAGNOSIS — D509 Iron deficiency anemia, unspecified: Secondary | ICD-10-CM | POA: Diagnosis not present

## 2021-03-13 DIAGNOSIS — N2581 Secondary hyperparathyroidism of renal origin: Secondary | ICD-10-CM | POA: Diagnosis not present

## 2021-03-16 DIAGNOSIS — R52 Pain, unspecified: Secondary | ICD-10-CM | POA: Diagnosis not present

## 2021-03-16 DIAGNOSIS — N186 End stage renal disease: Secondary | ICD-10-CM | POA: Diagnosis not present

## 2021-03-16 DIAGNOSIS — D631 Anemia in chronic kidney disease: Secondary | ICD-10-CM | POA: Diagnosis not present

## 2021-03-16 DIAGNOSIS — E119 Type 2 diabetes mellitus without complications: Secondary | ICD-10-CM | POA: Diagnosis not present

## 2021-03-16 DIAGNOSIS — D689 Coagulation defect, unspecified: Secondary | ICD-10-CM | POA: Diagnosis not present

## 2021-03-16 DIAGNOSIS — Z23 Encounter for immunization: Secondary | ICD-10-CM | POA: Diagnosis not present

## 2021-03-16 DIAGNOSIS — D509 Iron deficiency anemia, unspecified: Secondary | ICD-10-CM | POA: Diagnosis not present

## 2021-03-16 DIAGNOSIS — N2581 Secondary hyperparathyroidism of renal origin: Secondary | ICD-10-CM | POA: Diagnosis not present

## 2021-03-16 DIAGNOSIS — Z992 Dependence on renal dialysis: Secondary | ICD-10-CM | POA: Diagnosis not present

## 2021-03-18 DIAGNOSIS — N186 End stage renal disease: Secondary | ICD-10-CM | POA: Diagnosis not present

## 2021-03-18 DIAGNOSIS — D631 Anemia in chronic kidney disease: Secondary | ICD-10-CM | POA: Diagnosis not present

## 2021-03-18 DIAGNOSIS — Z23 Encounter for immunization: Secondary | ICD-10-CM | POA: Diagnosis not present

## 2021-03-18 DIAGNOSIS — N2581 Secondary hyperparathyroidism of renal origin: Secondary | ICD-10-CM | POA: Diagnosis not present

## 2021-03-18 DIAGNOSIS — Z992 Dependence on renal dialysis: Secondary | ICD-10-CM | POA: Diagnosis not present

## 2021-03-18 DIAGNOSIS — D509 Iron deficiency anemia, unspecified: Secondary | ICD-10-CM | POA: Diagnosis not present

## 2021-03-18 DIAGNOSIS — R52 Pain, unspecified: Secondary | ICD-10-CM | POA: Diagnosis not present

## 2021-03-18 DIAGNOSIS — E119 Type 2 diabetes mellitus without complications: Secondary | ICD-10-CM | POA: Diagnosis not present

## 2021-03-18 DIAGNOSIS — D689 Coagulation defect, unspecified: Secondary | ICD-10-CM | POA: Diagnosis not present

## 2021-03-20 DIAGNOSIS — E119 Type 2 diabetes mellitus without complications: Secondary | ICD-10-CM | POA: Diagnosis not present

## 2021-03-20 DIAGNOSIS — D689 Coagulation defect, unspecified: Secondary | ICD-10-CM | POA: Diagnosis not present

## 2021-03-20 DIAGNOSIS — N186 End stage renal disease: Secondary | ICD-10-CM | POA: Diagnosis not present

## 2021-03-20 DIAGNOSIS — D509 Iron deficiency anemia, unspecified: Secondary | ICD-10-CM | POA: Diagnosis not present

## 2021-03-20 DIAGNOSIS — N2581 Secondary hyperparathyroidism of renal origin: Secondary | ICD-10-CM | POA: Diagnosis not present

## 2021-03-20 DIAGNOSIS — R52 Pain, unspecified: Secondary | ICD-10-CM | POA: Diagnosis not present

## 2021-03-20 DIAGNOSIS — D631 Anemia in chronic kidney disease: Secondary | ICD-10-CM | POA: Diagnosis not present

## 2021-03-20 DIAGNOSIS — Z992 Dependence on renal dialysis: Secondary | ICD-10-CM | POA: Diagnosis not present

## 2021-03-20 DIAGNOSIS — Z23 Encounter for immunization: Secondary | ICD-10-CM | POA: Diagnosis not present

## 2021-03-23 DIAGNOSIS — D689 Coagulation defect, unspecified: Secondary | ICD-10-CM | POA: Diagnosis not present

## 2021-03-23 DIAGNOSIS — N186 End stage renal disease: Secondary | ICD-10-CM | POA: Diagnosis not present

## 2021-03-23 DIAGNOSIS — Z23 Encounter for immunization: Secondary | ICD-10-CM | POA: Diagnosis not present

## 2021-03-23 DIAGNOSIS — I129 Hypertensive chronic kidney disease with stage 1 through stage 4 chronic kidney disease, or unspecified chronic kidney disease: Secondary | ICD-10-CM | POA: Diagnosis not present

## 2021-03-23 DIAGNOSIS — Z992 Dependence on renal dialysis: Secondary | ICD-10-CM | POA: Diagnosis not present

## 2021-03-23 DIAGNOSIS — N2581 Secondary hyperparathyroidism of renal origin: Secondary | ICD-10-CM | POA: Diagnosis not present

## 2021-03-23 DIAGNOSIS — R52 Pain, unspecified: Secondary | ICD-10-CM | POA: Diagnosis not present

## 2021-03-23 DIAGNOSIS — D509 Iron deficiency anemia, unspecified: Secondary | ICD-10-CM | POA: Diagnosis not present

## 2021-03-23 DIAGNOSIS — E119 Type 2 diabetes mellitus without complications: Secondary | ICD-10-CM | POA: Diagnosis not present

## 2021-03-23 DIAGNOSIS — D631 Anemia in chronic kidney disease: Secondary | ICD-10-CM | POA: Diagnosis not present

## 2021-03-24 ENCOUNTER — Other Ambulatory Visit: Payer: Self-pay

## 2021-03-24 ENCOUNTER — Encounter (INDEPENDENT_AMBULATORY_CARE_PROVIDER_SITE_OTHER): Payer: Medicare Other | Admitting: Ophthalmology

## 2021-03-24 DIAGNOSIS — H35033 Hypertensive retinopathy, bilateral: Secondary | ICD-10-CM

## 2021-03-24 DIAGNOSIS — E113593 Type 2 diabetes mellitus with proliferative diabetic retinopathy without macular edema, bilateral: Secondary | ICD-10-CM

## 2021-03-24 DIAGNOSIS — I10 Essential (primary) hypertension: Secondary | ICD-10-CM

## 2021-03-24 DIAGNOSIS — H43813 Vitreous degeneration, bilateral: Secondary | ICD-10-CM

## 2021-03-25 DIAGNOSIS — D631 Anemia in chronic kidney disease: Secondary | ICD-10-CM | POA: Diagnosis not present

## 2021-03-25 DIAGNOSIS — N186 End stage renal disease: Secondary | ICD-10-CM | POA: Diagnosis not present

## 2021-03-25 DIAGNOSIS — Z992 Dependence on renal dialysis: Secondary | ICD-10-CM | POA: Diagnosis not present

## 2021-03-25 DIAGNOSIS — D689 Coagulation defect, unspecified: Secondary | ICD-10-CM | POA: Diagnosis not present

## 2021-03-25 DIAGNOSIS — D509 Iron deficiency anemia, unspecified: Secondary | ICD-10-CM | POA: Diagnosis not present

## 2021-03-25 DIAGNOSIS — E119 Type 2 diabetes mellitus without complications: Secondary | ICD-10-CM | POA: Diagnosis not present

## 2021-03-25 DIAGNOSIS — N2581 Secondary hyperparathyroidism of renal origin: Secondary | ICD-10-CM | POA: Diagnosis not present

## 2021-03-27 DIAGNOSIS — N2581 Secondary hyperparathyroidism of renal origin: Secondary | ICD-10-CM | POA: Diagnosis not present

## 2021-03-27 DIAGNOSIS — D689 Coagulation defect, unspecified: Secondary | ICD-10-CM | POA: Diagnosis not present

## 2021-03-27 DIAGNOSIS — E119 Type 2 diabetes mellitus without complications: Secondary | ICD-10-CM | POA: Diagnosis not present

## 2021-03-27 DIAGNOSIS — D509 Iron deficiency anemia, unspecified: Secondary | ICD-10-CM | POA: Diagnosis not present

## 2021-03-27 DIAGNOSIS — Z992 Dependence on renal dialysis: Secondary | ICD-10-CM | POA: Diagnosis not present

## 2021-03-27 DIAGNOSIS — N186 End stage renal disease: Secondary | ICD-10-CM | POA: Diagnosis not present

## 2021-03-27 DIAGNOSIS — D631 Anemia in chronic kidney disease: Secondary | ICD-10-CM | POA: Diagnosis not present

## 2021-03-30 DIAGNOSIS — D509 Iron deficiency anemia, unspecified: Secondary | ICD-10-CM | POA: Diagnosis not present

## 2021-03-30 DIAGNOSIS — N2581 Secondary hyperparathyroidism of renal origin: Secondary | ICD-10-CM | POA: Diagnosis not present

## 2021-03-30 DIAGNOSIS — Z992 Dependence on renal dialysis: Secondary | ICD-10-CM | POA: Diagnosis not present

## 2021-03-30 DIAGNOSIS — D689 Coagulation defect, unspecified: Secondary | ICD-10-CM | POA: Diagnosis not present

## 2021-03-30 DIAGNOSIS — E119 Type 2 diabetes mellitus without complications: Secondary | ICD-10-CM | POA: Diagnosis not present

## 2021-03-30 DIAGNOSIS — D631 Anemia in chronic kidney disease: Secondary | ICD-10-CM | POA: Diagnosis not present

## 2021-03-30 DIAGNOSIS — N186 End stage renal disease: Secondary | ICD-10-CM | POA: Diagnosis not present

## 2021-03-31 DIAGNOSIS — I5030 Unspecified diastolic (congestive) heart failure: Secondary | ICD-10-CM | POA: Diagnosis not present

## 2021-03-31 DIAGNOSIS — Z992 Dependence on renal dialysis: Secondary | ICD-10-CM | POA: Diagnosis not present

## 2021-03-31 DIAGNOSIS — Z7984 Long term (current) use of oral hypoglycemic drugs: Secondary | ICD-10-CM | POA: Diagnosis not present

## 2021-03-31 DIAGNOSIS — N186 End stage renal disease: Secondary | ICD-10-CM | POA: Diagnosis not present

## 2021-03-31 DIAGNOSIS — E1122 Type 2 diabetes mellitus with diabetic chronic kidney disease: Secondary | ICD-10-CM | POA: Diagnosis not present

## 2021-03-31 DIAGNOSIS — Z7682 Awaiting organ transplant status: Secondary | ICD-10-CM | POA: Diagnosis not present

## 2021-03-31 DIAGNOSIS — I130 Hypertensive heart and chronic kidney disease with heart failure and stage 1 through stage 4 chronic kidney disease, or unspecified chronic kidney disease: Secondary | ICD-10-CM | POA: Diagnosis not present

## 2021-03-31 DIAGNOSIS — I509 Heart failure, unspecified: Secondary | ICD-10-CM | POA: Insufficient documentation

## 2021-03-31 DIAGNOSIS — Z8673 Personal history of transient ischemic attack (TIA), and cerebral infarction without residual deficits: Secondary | ICD-10-CM | POA: Diagnosis not present

## 2021-03-31 DIAGNOSIS — E7800 Pure hypercholesterolemia, unspecified: Secondary | ICD-10-CM | POA: Diagnosis not present

## 2021-04-01 DIAGNOSIS — N2581 Secondary hyperparathyroidism of renal origin: Secondary | ICD-10-CM | POA: Diagnosis not present

## 2021-04-01 DIAGNOSIS — D689 Coagulation defect, unspecified: Secondary | ICD-10-CM | POA: Diagnosis not present

## 2021-04-01 DIAGNOSIS — D631 Anemia in chronic kidney disease: Secondary | ICD-10-CM | POA: Diagnosis not present

## 2021-04-01 DIAGNOSIS — Z992 Dependence on renal dialysis: Secondary | ICD-10-CM | POA: Diagnosis not present

## 2021-04-01 DIAGNOSIS — D509 Iron deficiency anemia, unspecified: Secondary | ICD-10-CM | POA: Diagnosis not present

## 2021-04-01 DIAGNOSIS — E119 Type 2 diabetes mellitus without complications: Secondary | ICD-10-CM | POA: Diagnosis not present

## 2021-04-01 DIAGNOSIS — N186 End stage renal disease: Secondary | ICD-10-CM | POA: Diagnosis not present

## 2021-04-03 DIAGNOSIS — E119 Type 2 diabetes mellitus without complications: Secondary | ICD-10-CM | POA: Diagnosis not present

## 2021-04-03 DIAGNOSIS — Z992 Dependence on renal dialysis: Secondary | ICD-10-CM | POA: Diagnosis not present

## 2021-04-03 DIAGNOSIS — N2581 Secondary hyperparathyroidism of renal origin: Secondary | ICD-10-CM | POA: Diagnosis not present

## 2021-04-03 DIAGNOSIS — D509 Iron deficiency anemia, unspecified: Secondary | ICD-10-CM | POA: Diagnosis not present

## 2021-04-03 DIAGNOSIS — D631 Anemia in chronic kidney disease: Secondary | ICD-10-CM | POA: Diagnosis not present

## 2021-04-03 DIAGNOSIS — D689 Coagulation defect, unspecified: Secondary | ICD-10-CM | POA: Diagnosis not present

## 2021-04-03 DIAGNOSIS — N186 End stage renal disease: Secondary | ICD-10-CM | POA: Diagnosis not present

## 2021-04-05 DIAGNOSIS — H25813 Combined forms of age-related cataract, bilateral: Secondary | ICD-10-CM | POA: Diagnosis not present

## 2021-04-05 DIAGNOSIS — E119 Type 2 diabetes mellitus without complications: Secondary | ICD-10-CM | POA: Diagnosis not present

## 2021-04-06 DIAGNOSIS — D689 Coagulation defect, unspecified: Secondary | ICD-10-CM | POA: Diagnosis not present

## 2021-04-06 DIAGNOSIS — Z992 Dependence on renal dialysis: Secondary | ICD-10-CM | POA: Diagnosis not present

## 2021-04-06 DIAGNOSIS — N2581 Secondary hyperparathyroidism of renal origin: Secondary | ICD-10-CM | POA: Diagnosis not present

## 2021-04-06 DIAGNOSIS — N186 End stage renal disease: Secondary | ICD-10-CM | POA: Diagnosis not present

## 2021-04-06 DIAGNOSIS — D509 Iron deficiency anemia, unspecified: Secondary | ICD-10-CM | POA: Diagnosis not present

## 2021-04-06 DIAGNOSIS — D631 Anemia in chronic kidney disease: Secondary | ICD-10-CM | POA: Diagnosis not present

## 2021-04-06 DIAGNOSIS — E119 Type 2 diabetes mellitus without complications: Secondary | ICD-10-CM | POA: Diagnosis not present

## 2021-04-07 ENCOUNTER — Encounter (INDEPENDENT_AMBULATORY_CARE_PROVIDER_SITE_OTHER): Payer: Medicare Other | Admitting: Ophthalmology

## 2021-04-08 DIAGNOSIS — D631 Anemia in chronic kidney disease: Secondary | ICD-10-CM | POA: Diagnosis not present

## 2021-04-08 DIAGNOSIS — N186 End stage renal disease: Secondary | ICD-10-CM | POA: Diagnosis not present

## 2021-04-08 DIAGNOSIS — N2581 Secondary hyperparathyroidism of renal origin: Secondary | ICD-10-CM | POA: Diagnosis not present

## 2021-04-08 DIAGNOSIS — D509 Iron deficiency anemia, unspecified: Secondary | ICD-10-CM | POA: Diagnosis not present

## 2021-04-08 DIAGNOSIS — E119 Type 2 diabetes mellitus without complications: Secondary | ICD-10-CM | POA: Diagnosis not present

## 2021-04-08 DIAGNOSIS — Z992 Dependence on renal dialysis: Secondary | ICD-10-CM | POA: Diagnosis not present

## 2021-04-08 DIAGNOSIS — D689 Coagulation defect, unspecified: Secondary | ICD-10-CM | POA: Diagnosis not present

## 2021-04-10 DIAGNOSIS — N2581 Secondary hyperparathyroidism of renal origin: Secondary | ICD-10-CM | POA: Diagnosis not present

## 2021-04-10 DIAGNOSIS — D689 Coagulation defect, unspecified: Secondary | ICD-10-CM | POA: Diagnosis not present

## 2021-04-10 DIAGNOSIS — N186 End stage renal disease: Secondary | ICD-10-CM | POA: Diagnosis not present

## 2021-04-10 DIAGNOSIS — E119 Type 2 diabetes mellitus without complications: Secondary | ICD-10-CM | POA: Diagnosis not present

## 2021-04-10 DIAGNOSIS — D509 Iron deficiency anemia, unspecified: Secondary | ICD-10-CM | POA: Diagnosis not present

## 2021-04-10 DIAGNOSIS — D631 Anemia in chronic kidney disease: Secondary | ICD-10-CM | POA: Diagnosis not present

## 2021-04-10 DIAGNOSIS — Z992 Dependence on renal dialysis: Secondary | ICD-10-CM | POA: Diagnosis not present

## 2021-04-12 DIAGNOSIS — Z780 Asymptomatic menopausal state: Secondary | ICD-10-CM | POA: Diagnosis not present

## 2021-04-12 DIAGNOSIS — M85852 Other specified disorders of bone density and structure, left thigh: Secondary | ICD-10-CM | POA: Diagnosis not present

## 2021-04-12 DIAGNOSIS — Z13820 Encounter for screening for osteoporosis: Secondary | ICD-10-CM | POA: Diagnosis not present

## 2021-04-13 DIAGNOSIS — E119 Type 2 diabetes mellitus without complications: Secondary | ICD-10-CM | POA: Diagnosis not present

## 2021-04-13 DIAGNOSIS — N2581 Secondary hyperparathyroidism of renal origin: Secondary | ICD-10-CM | POA: Diagnosis not present

## 2021-04-13 DIAGNOSIS — D509 Iron deficiency anemia, unspecified: Secondary | ICD-10-CM | POA: Diagnosis not present

## 2021-04-13 DIAGNOSIS — D631 Anemia in chronic kidney disease: Secondary | ICD-10-CM | POA: Diagnosis not present

## 2021-04-13 DIAGNOSIS — D689 Coagulation defect, unspecified: Secondary | ICD-10-CM | POA: Diagnosis not present

## 2021-04-13 DIAGNOSIS — Z992 Dependence on renal dialysis: Secondary | ICD-10-CM | POA: Diagnosis not present

## 2021-04-13 DIAGNOSIS — N186 End stage renal disease: Secondary | ICD-10-CM | POA: Diagnosis not present

## 2021-04-15 DIAGNOSIS — N186 End stage renal disease: Secondary | ICD-10-CM | POA: Diagnosis not present

## 2021-04-15 DIAGNOSIS — E119 Type 2 diabetes mellitus without complications: Secondary | ICD-10-CM | POA: Diagnosis not present

## 2021-04-15 DIAGNOSIS — D631 Anemia in chronic kidney disease: Secondary | ICD-10-CM | POA: Diagnosis not present

## 2021-04-15 DIAGNOSIS — N2581 Secondary hyperparathyroidism of renal origin: Secondary | ICD-10-CM | POA: Diagnosis not present

## 2021-04-15 DIAGNOSIS — D689 Coagulation defect, unspecified: Secondary | ICD-10-CM | POA: Diagnosis not present

## 2021-04-15 DIAGNOSIS — D509 Iron deficiency anemia, unspecified: Secondary | ICD-10-CM | POA: Diagnosis not present

## 2021-04-15 DIAGNOSIS — Z992 Dependence on renal dialysis: Secondary | ICD-10-CM | POA: Diagnosis not present

## 2021-04-16 DIAGNOSIS — H25812 Combined forms of age-related cataract, left eye: Secondary | ICD-10-CM | POA: Diagnosis not present

## 2021-04-17 DIAGNOSIS — E119 Type 2 diabetes mellitus without complications: Secondary | ICD-10-CM | POA: Diagnosis not present

## 2021-04-17 DIAGNOSIS — N186 End stage renal disease: Secondary | ICD-10-CM | POA: Diagnosis not present

## 2021-04-17 DIAGNOSIS — D509 Iron deficiency anemia, unspecified: Secondary | ICD-10-CM | POA: Diagnosis not present

## 2021-04-17 DIAGNOSIS — D631 Anemia in chronic kidney disease: Secondary | ICD-10-CM | POA: Diagnosis not present

## 2021-04-17 DIAGNOSIS — Z992 Dependence on renal dialysis: Secondary | ICD-10-CM | POA: Diagnosis not present

## 2021-04-17 DIAGNOSIS — N2581 Secondary hyperparathyroidism of renal origin: Secondary | ICD-10-CM | POA: Diagnosis not present

## 2021-04-17 DIAGNOSIS — D689 Coagulation defect, unspecified: Secondary | ICD-10-CM | POA: Diagnosis not present

## 2021-04-20 DIAGNOSIS — N2581 Secondary hyperparathyroidism of renal origin: Secondary | ICD-10-CM | POA: Diagnosis not present

## 2021-04-20 DIAGNOSIS — D509 Iron deficiency anemia, unspecified: Secondary | ICD-10-CM | POA: Diagnosis not present

## 2021-04-20 DIAGNOSIS — Z992 Dependence on renal dialysis: Secondary | ICD-10-CM | POA: Diagnosis not present

## 2021-04-20 DIAGNOSIS — N186 End stage renal disease: Secondary | ICD-10-CM | POA: Diagnosis not present

## 2021-04-20 DIAGNOSIS — E119 Type 2 diabetes mellitus without complications: Secondary | ICD-10-CM | POA: Diagnosis not present

## 2021-04-20 DIAGNOSIS — D631 Anemia in chronic kidney disease: Secondary | ICD-10-CM | POA: Diagnosis not present

## 2021-04-20 DIAGNOSIS — D689 Coagulation defect, unspecified: Secondary | ICD-10-CM | POA: Diagnosis not present

## 2021-04-22 DIAGNOSIS — Z992 Dependence on renal dialysis: Secondary | ICD-10-CM | POA: Diagnosis not present

## 2021-04-22 DIAGNOSIS — E119 Type 2 diabetes mellitus without complications: Secondary | ICD-10-CM | POA: Diagnosis not present

## 2021-04-22 DIAGNOSIS — N2581 Secondary hyperparathyroidism of renal origin: Secondary | ICD-10-CM | POA: Diagnosis not present

## 2021-04-22 DIAGNOSIS — D689 Coagulation defect, unspecified: Secondary | ICD-10-CM | POA: Diagnosis not present

## 2021-04-22 DIAGNOSIS — D509 Iron deficiency anemia, unspecified: Secondary | ICD-10-CM | POA: Diagnosis not present

## 2021-04-22 DIAGNOSIS — D631 Anemia in chronic kidney disease: Secondary | ICD-10-CM | POA: Diagnosis not present

## 2021-04-22 DIAGNOSIS — N186 End stage renal disease: Secondary | ICD-10-CM | POA: Diagnosis not present

## 2021-04-23 DIAGNOSIS — Z992 Dependence on renal dialysis: Secondary | ICD-10-CM | POA: Diagnosis not present

## 2021-04-23 DIAGNOSIS — N186 End stage renal disease: Secondary | ICD-10-CM | POA: Diagnosis not present

## 2021-04-23 DIAGNOSIS — I129 Hypertensive chronic kidney disease with stage 1 through stage 4 chronic kidney disease, or unspecified chronic kidney disease: Secondary | ICD-10-CM | POA: Diagnosis not present

## 2021-04-24 DIAGNOSIS — D689 Coagulation defect, unspecified: Secondary | ICD-10-CM | POA: Diagnosis not present

## 2021-04-24 DIAGNOSIS — N2581 Secondary hyperparathyroidism of renal origin: Secondary | ICD-10-CM | POA: Diagnosis not present

## 2021-04-24 DIAGNOSIS — D509 Iron deficiency anemia, unspecified: Secondary | ICD-10-CM | POA: Diagnosis not present

## 2021-04-24 DIAGNOSIS — D631 Anemia in chronic kidney disease: Secondary | ICD-10-CM | POA: Diagnosis not present

## 2021-04-24 DIAGNOSIS — Z992 Dependence on renal dialysis: Secondary | ICD-10-CM | POA: Diagnosis not present

## 2021-04-24 DIAGNOSIS — N186 End stage renal disease: Secondary | ICD-10-CM | POA: Diagnosis not present

## 2021-04-24 DIAGNOSIS — E119 Type 2 diabetes mellitus without complications: Secondary | ICD-10-CM | POA: Diagnosis not present

## 2021-04-24 DIAGNOSIS — E039 Hypothyroidism, unspecified: Secondary | ICD-10-CM | POA: Diagnosis not present

## 2021-04-27 DIAGNOSIS — E119 Type 2 diabetes mellitus without complications: Secondary | ICD-10-CM | POA: Diagnosis not present

## 2021-04-27 DIAGNOSIS — N2581 Secondary hyperparathyroidism of renal origin: Secondary | ICD-10-CM | POA: Diagnosis not present

## 2021-04-27 DIAGNOSIS — D689 Coagulation defect, unspecified: Secondary | ICD-10-CM | POA: Diagnosis not present

## 2021-04-27 DIAGNOSIS — N186 End stage renal disease: Secondary | ICD-10-CM | POA: Diagnosis not present

## 2021-04-27 DIAGNOSIS — E039 Hypothyroidism, unspecified: Secondary | ICD-10-CM | POA: Diagnosis not present

## 2021-04-27 DIAGNOSIS — Z992 Dependence on renal dialysis: Secondary | ICD-10-CM | POA: Diagnosis not present

## 2021-04-27 DIAGNOSIS — D631 Anemia in chronic kidney disease: Secondary | ICD-10-CM | POA: Diagnosis not present

## 2021-04-27 DIAGNOSIS — D509 Iron deficiency anemia, unspecified: Secondary | ICD-10-CM | POA: Diagnosis not present

## 2021-04-29 DIAGNOSIS — D509 Iron deficiency anemia, unspecified: Secondary | ICD-10-CM | POA: Diagnosis not present

## 2021-04-29 DIAGNOSIS — N2581 Secondary hyperparathyroidism of renal origin: Secondary | ICD-10-CM | POA: Diagnosis not present

## 2021-04-29 DIAGNOSIS — N186 End stage renal disease: Secondary | ICD-10-CM | POA: Diagnosis not present

## 2021-04-29 DIAGNOSIS — E039 Hypothyroidism, unspecified: Secondary | ICD-10-CM | POA: Diagnosis not present

## 2021-04-29 DIAGNOSIS — D689 Coagulation defect, unspecified: Secondary | ICD-10-CM | POA: Diagnosis not present

## 2021-04-29 DIAGNOSIS — Z992 Dependence on renal dialysis: Secondary | ICD-10-CM | POA: Diagnosis not present

## 2021-04-29 DIAGNOSIS — E119 Type 2 diabetes mellitus without complications: Secondary | ICD-10-CM | POA: Diagnosis not present

## 2021-04-29 DIAGNOSIS — D631 Anemia in chronic kidney disease: Secondary | ICD-10-CM | POA: Diagnosis not present

## 2021-05-01 DIAGNOSIS — N186 End stage renal disease: Secondary | ICD-10-CM | POA: Diagnosis not present

## 2021-05-01 DIAGNOSIS — E039 Hypothyroidism, unspecified: Secondary | ICD-10-CM | POA: Diagnosis not present

## 2021-05-01 DIAGNOSIS — N2581 Secondary hyperparathyroidism of renal origin: Secondary | ICD-10-CM | POA: Diagnosis not present

## 2021-05-01 DIAGNOSIS — Z992 Dependence on renal dialysis: Secondary | ICD-10-CM | POA: Diagnosis not present

## 2021-05-01 DIAGNOSIS — E119 Type 2 diabetes mellitus without complications: Secondary | ICD-10-CM | POA: Diagnosis not present

## 2021-05-01 DIAGNOSIS — D631 Anemia in chronic kidney disease: Secondary | ICD-10-CM | POA: Diagnosis not present

## 2021-05-01 DIAGNOSIS — D509 Iron deficiency anemia, unspecified: Secondary | ICD-10-CM | POA: Diagnosis not present

## 2021-05-01 DIAGNOSIS — D689 Coagulation defect, unspecified: Secondary | ICD-10-CM | POA: Diagnosis not present

## 2021-05-04 DIAGNOSIS — E119 Type 2 diabetes mellitus without complications: Secondary | ICD-10-CM | POA: Diagnosis not present

## 2021-05-04 DIAGNOSIS — N186 End stage renal disease: Secondary | ICD-10-CM | POA: Diagnosis not present

## 2021-05-04 DIAGNOSIS — E039 Hypothyroidism, unspecified: Secondary | ICD-10-CM | POA: Diagnosis not present

## 2021-05-04 DIAGNOSIS — N2581 Secondary hyperparathyroidism of renal origin: Secondary | ICD-10-CM | POA: Diagnosis not present

## 2021-05-04 DIAGNOSIS — Z992 Dependence on renal dialysis: Secondary | ICD-10-CM | POA: Diagnosis not present

## 2021-05-04 DIAGNOSIS — D631 Anemia in chronic kidney disease: Secondary | ICD-10-CM | POA: Diagnosis not present

## 2021-05-04 DIAGNOSIS — D509 Iron deficiency anemia, unspecified: Secondary | ICD-10-CM | POA: Diagnosis not present

## 2021-05-04 DIAGNOSIS — D689 Coagulation defect, unspecified: Secondary | ICD-10-CM | POA: Diagnosis not present

## 2021-05-06 DIAGNOSIS — D509 Iron deficiency anemia, unspecified: Secondary | ICD-10-CM | POA: Diagnosis not present

## 2021-05-06 DIAGNOSIS — N2581 Secondary hyperparathyroidism of renal origin: Secondary | ICD-10-CM | POA: Diagnosis not present

## 2021-05-06 DIAGNOSIS — E039 Hypothyroidism, unspecified: Secondary | ICD-10-CM | POA: Diagnosis not present

## 2021-05-06 DIAGNOSIS — D631 Anemia in chronic kidney disease: Secondary | ICD-10-CM | POA: Diagnosis not present

## 2021-05-06 DIAGNOSIS — Z992 Dependence on renal dialysis: Secondary | ICD-10-CM | POA: Diagnosis not present

## 2021-05-06 DIAGNOSIS — N186 End stage renal disease: Secondary | ICD-10-CM | POA: Diagnosis not present

## 2021-05-06 DIAGNOSIS — D689 Coagulation defect, unspecified: Secondary | ICD-10-CM | POA: Diagnosis not present

## 2021-05-06 DIAGNOSIS — E119 Type 2 diabetes mellitus without complications: Secondary | ICD-10-CM | POA: Diagnosis not present

## 2021-05-07 DIAGNOSIS — I120 Hypertensive chronic kidney disease with stage 5 chronic kidney disease or end stage renal disease: Secondary | ICD-10-CM | POA: Diagnosis not present

## 2021-05-07 DIAGNOSIS — Z7682 Awaiting organ transplant status: Secondary | ICD-10-CM | POA: Diagnosis not present

## 2021-05-07 DIAGNOSIS — Z1159 Encounter for screening for other viral diseases: Secondary | ICD-10-CM | POA: Diagnosis not present

## 2021-05-07 DIAGNOSIS — K5520 Angiodysplasia of colon without hemorrhage: Secondary | ICD-10-CM | POA: Diagnosis not present

## 2021-05-07 DIAGNOSIS — I6350 Cerebral infarction due to unspecified occlusion or stenosis of unspecified cerebral artery: Secondary | ICD-10-CM | POA: Diagnosis not present

## 2021-05-07 DIAGNOSIS — I132 Hypertensive heart and chronic kidney disease with heart failure and with stage 5 chronic kidney disease, or end stage renal disease: Secondary | ICD-10-CM | POA: Diagnosis not present

## 2021-05-07 DIAGNOSIS — I5033 Acute on chronic diastolic (congestive) heart failure: Secondary | ICD-10-CM | POA: Diagnosis not present

## 2021-05-07 DIAGNOSIS — E113599 Type 2 diabetes mellitus with proliferative diabetic retinopathy without macular edema, unspecified eye: Secondary | ICD-10-CM | POA: Diagnosis not present

## 2021-05-07 DIAGNOSIS — Z79899 Other long term (current) drug therapy: Secondary | ICD-10-CM | POA: Diagnosis not present

## 2021-05-07 DIAGNOSIS — Z992 Dependence on renal dialysis: Secondary | ICD-10-CM | POA: Diagnosis not present

## 2021-05-07 DIAGNOSIS — N185 Chronic kidney disease, stage 5: Secondary | ICD-10-CM | POA: Diagnosis not present

## 2021-05-07 DIAGNOSIS — E1121 Type 2 diabetes mellitus with diabetic nephropathy: Secondary | ICD-10-CM | POA: Diagnosis not present

## 2021-05-07 DIAGNOSIS — Z87891 Personal history of nicotine dependence: Secondary | ICD-10-CM | POA: Diagnosis not present

## 2021-05-07 DIAGNOSIS — E785 Hyperlipidemia, unspecified: Secondary | ICD-10-CM | POA: Diagnosis not present

## 2021-05-07 DIAGNOSIS — Z7984 Long term (current) use of oral hypoglycemic drugs: Secondary | ICD-10-CM | POA: Diagnosis not present

## 2021-05-07 DIAGNOSIS — Z01818 Encounter for other preprocedural examination: Secondary | ICD-10-CM | POA: Diagnosis not present

## 2021-05-07 DIAGNOSIS — N186 End stage renal disease: Secondary | ICD-10-CM | POA: Diagnosis not present

## 2021-05-07 DIAGNOSIS — Z85118 Personal history of other malignant neoplasm of bronchus and lung: Secondary | ICD-10-CM | POA: Diagnosis not present

## 2021-05-07 DIAGNOSIS — M109 Gout, unspecified: Secondary | ICD-10-CM | POA: Diagnosis not present

## 2021-05-07 DIAGNOSIS — Z8673 Personal history of transient ischemic attack (TIA), and cerebral infarction without residual deficits: Secondary | ICD-10-CM | POA: Diagnosis not present

## 2021-05-07 DIAGNOSIS — D631 Anemia in chronic kidney disease: Secondary | ICD-10-CM | POA: Diagnosis not present

## 2021-05-07 DIAGNOSIS — K219 Gastro-esophageal reflux disease without esophagitis: Secondary | ICD-10-CM | POA: Diagnosis not present

## 2021-05-08 DIAGNOSIS — N186 End stage renal disease: Secondary | ICD-10-CM | POA: Diagnosis not present

## 2021-05-08 DIAGNOSIS — D509 Iron deficiency anemia, unspecified: Secondary | ICD-10-CM | POA: Diagnosis not present

## 2021-05-08 DIAGNOSIS — D689 Coagulation defect, unspecified: Secondary | ICD-10-CM | POA: Diagnosis not present

## 2021-05-08 DIAGNOSIS — Z992 Dependence on renal dialysis: Secondary | ICD-10-CM | POA: Diagnosis not present

## 2021-05-08 DIAGNOSIS — D631 Anemia in chronic kidney disease: Secondary | ICD-10-CM | POA: Diagnosis not present

## 2021-05-08 DIAGNOSIS — E119 Type 2 diabetes mellitus without complications: Secondary | ICD-10-CM | POA: Diagnosis not present

## 2021-05-08 DIAGNOSIS — E039 Hypothyroidism, unspecified: Secondary | ICD-10-CM | POA: Diagnosis not present

## 2021-05-08 DIAGNOSIS — N2581 Secondary hyperparathyroidism of renal origin: Secondary | ICD-10-CM | POA: Diagnosis not present

## 2021-05-11 DIAGNOSIS — N2581 Secondary hyperparathyroidism of renal origin: Secondary | ICD-10-CM | POA: Diagnosis not present

## 2021-05-11 DIAGNOSIS — D689 Coagulation defect, unspecified: Secondary | ICD-10-CM | POA: Diagnosis not present

## 2021-05-11 DIAGNOSIS — Z992 Dependence on renal dialysis: Secondary | ICD-10-CM | POA: Diagnosis not present

## 2021-05-11 DIAGNOSIS — D509 Iron deficiency anemia, unspecified: Secondary | ICD-10-CM | POA: Diagnosis not present

## 2021-05-11 DIAGNOSIS — E039 Hypothyroidism, unspecified: Secondary | ICD-10-CM | POA: Diagnosis not present

## 2021-05-11 DIAGNOSIS — E119 Type 2 diabetes mellitus without complications: Secondary | ICD-10-CM | POA: Diagnosis not present

## 2021-05-11 DIAGNOSIS — D631 Anemia in chronic kidney disease: Secondary | ICD-10-CM | POA: Diagnosis not present

## 2021-05-11 DIAGNOSIS — N186 End stage renal disease: Secondary | ICD-10-CM | POA: Diagnosis not present

## 2021-05-13 DIAGNOSIS — Z992 Dependence on renal dialysis: Secondary | ICD-10-CM | POA: Diagnosis not present

## 2021-05-13 DIAGNOSIS — E119 Type 2 diabetes mellitus without complications: Secondary | ICD-10-CM | POA: Diagnosis not present

## 2021-05-13 DIAGNOSIS — D631 Anemia in chronic kidney disease: Secondary | ICD-10-CM | POA: Diagnosis not present

## 2021-05-13 DIAGNOSIS — N2581 Secondary hyperparathyroidism of renal origin: Secondary | ICD-10-CM | POA: Diagnosis not present

## 2021-05-13 DIAGNOSIS — N186 End stage renal disease: Secondary | ICD-10-CM | POA: Diagnosis not present

## 2021-05-13 DIAGNOSIS — D509 Iron deficiency anemia, unspecified: Secondary | ICD-10-CM | POA: Diagnosis not present

## 2021-05-13 DIAGNOSIS — D689 Coagulation defect, unspecified: Secondary | ICD-10-CM | POA: Diagnosis not present

## 2021-05-13 DIAGNOSIS — E039 Hypothyroidism, unspecified: Secondary | ICD-10-CM | POA: Diagnosis not present

## 2021-05-15 DIAGNOSIS — E119 Type 2 diabetes mellitus without complications: Secondary | ICD-10-CM | POA: Diagnosis not present

## 2021-05-15 DIAGNOSIS — D631 Anemia in chronic kidney disease: Secondary | ICD-10-CM | POA: Diagnosis not present

## 2021-05-15 DIAGNOSIS — Z992 Dependence on renal dialysis: Secondary | ICD-10-CM | POA: Diagnosis not present

## 2021-05-15 DIAGNOSIS — D509 Iron deficiency anemia, unspecified: Secondary | ICD-10-CM | POA: Diagnosis not present

## 2021-05-15 DIAGNOSIS — N2581 Secondary hyperparathyroidism of renal origin: Secondary | ICD-10-CM | POA: Diagnosis not present

## 2021-05-15 DIAGNOSIS — N186 End stage renal disease: Secondary | ICD-10-CM | POA: Diagnosis not present

## 2021-05-15 DIAGNOSIS — D689 Coagulation defect, unspecified: Secondary | ICD-10-CM | POA: Diagnosis not present

## 2021-05-15 DIAGNOSIS — E039 Hypothyroidism, unspecified: Secondary | ICD-10-CM | POA: Diagnosis not present

## 2021-05-18 DIAGNOSIS — E119 Type 2 diabetes mellitus without complications: Secondary | ICD-10-CM | POA: Diagnosis not present

## 2021-05-18 DIAGNOSIS — D509 Iron deficiency anemia, unspecified: Secondary | ICD-10-CM | POA: Diagnosis not present

## 2021-05-18 DIAGNOSIS — E039 Hypothyroidism, unspecified: Secondary | ICD-10-CM | POA: Diagnosis not present

## 2021-05-18 DIAGNOSIS — D631 Anemia in chronic kidney disease: Secondary | ICD-10-CM | POA: Diagnosis not present

## 2021-05-18 DIAGNOSIS — N186 End stage renal disease: Secondary | ICD-10-CM | POA: Diagnosis not present

## 2021-05-18 DIAGNOSIS — D689 Coagulation defect, unspecified: Secondary | ICD-10-CM | POA: Diagnosis not present

## 2021-05-18 DIAGNOSIS — N2581 Secondary hyperparathyroidism of renal origin: Secondary | ICD-10-CM | POA: Diagnosis not present

## 2021-05-18 DIAGNOSIS — Z992 Dependence on renal dialysis: Secondary | ICD-10-CM | POA: Diagnosis not present

## 2021-05-20 DIAGNOSIS — D689 Coagulation defect, unspecified: Secondary | ICD-10-CM | POA: Diagnosis not present

## 2021-05-20 DIAGNOSIS — D509 Iron deficiency anemia, unspecified: Secondary | ICD-10-CM | POA: Diagnosis not present

## 2021-05-20 DIAGNOSIS — E039 Hypothyroidism, unspecified: Secondary | ICD-10-CM | POA: Diagnosis not present

## 2021-05-20 DIAGNOSIS — E119 Type 2 diabetes mellitus without complications: Secondary | ICD-10-CM | POA: Diagnosis not present

## 2021-05-20 DIAGNOSIS — N2581 Secondary hyperparathyroidism of renal origin: Secondary | ICD-10-CM | POA: Diagnosis not present

## 2021-05-20 DIAGNOSIS — Z992 Dependence on renal dialysis: Secondary | ICD-10-CM | POA: Diagnosis not present

## 2021-05-20 DIAGNOSIS — D631 Anemia in chronic kidney disease: Secondary | ICD-10-CM | POA: Diagnosis not present

## 2021-05-20 DIAGNOSIS — N186 End stage renal disease: Secondary | ICD-10-CM | POA: Diagnosis not present

## 2021-05-22 DIAGNOSIS — N2581 Secondary hyperparathyroidism of renal origin: Secondary | ICD-10-CM | POA: Diagnosis not present

## 2021-05-22 DIAGNOSIS — D631 Anemia in chronic kidney disease: Secondary | ICD-10-CM | POA: Diagnosis not present

## 2021-05-22 DIAGNOSIS — E119 Type 2 diabetes mellitus without complications: Secondary | ICD-10-CM | POA: Diagnosis not present

## 2021-05-22 DIAGNOSIS — E039 Hypothyroidism, unspecified: Secondary | ICD-10-CM | POA: Diagnosis not present

## 2021-05-22 DIAGNOSIS — D509 Iron deficiency anemia, unspecified: Secondary | ICD-10-CM | POA: Diagnosis not present

## 2021-05-22 DIAGNOSIS — Z992 Dependence on renal dialysis: Secondary | ICD-10-CM | POA: Diagnosis not present

## 2021-05-22 DIAGNOSIS — N186 End stage renal disease: Secondary | ICD-10-CM | POA: Diagnosis not present

## 2021-05-22 DIAGNOSIS — D689 Coagulation defect, unspecified: Secondary | ICD-10-CM | POA: Diagnosis not present

## 2021-05-23 DIAGNOSIS — N186 End stage renal disease: Secondary | ICD-10-CM | POA: Diagnosis not present

## 2021-05-23 DIAGNOSIS — I129 Hypertensive chronic kidney disease with stage 1 through stage 4 chronic kidney disease, or unspecified chronic kidney disease: Secondary | ICD-10-CM | POA: Diagnosis not present

## 2021-05-23 DIAGNOSIS — Z992 Dependence on renal dialysis: Secondary | ICD-10-CM | POA: Diagnosis not present

## 2021-05-25 ENCOUNTER — Ambulatory Visit (INDEPENDENT_AMBULATORY_CARE_PROVIDER_SITE_OTHER): Payer: Medicare Other | Admitting: Podiatry

## 2021-05-25 DIAGNOSIS — N184 Chronic kidney disease, stage 4 (severe): Secondary | ICD-10-CM

## 2021-05-25 DIAGNOSIS — E1151 Type 2 diabetes mellitus with diabetic peripheral angiopathy without gangrene: Secondary | ICD-10-CM

## 2021-05-25 DIAGNOSIS — M79675 Pain in left toe(s): Secondary | ICD-10-CM

## 2021-05-25 DIAGNOSIS — B351 Tinea unguium: Secondary | ICD-10-CM

## 2021-05-25 DIAGNOSIS — M79674 Pain in right toe(s): Secondary | ICD-10-CM

## 2021-05-25 NOTE — Progress Notes (Signed)
This patient returns to my office for at risk foot care.  This patient requires this care by a professional since this patient will be at risk due to having type 2 diabetes and CKD.    This patient is unable to cut nails herself since the patient cannot reach her nails.These nails are painful walking and wearing shoes.  This patient presents for at risk foot care today.  General Appearance  Alert, conversant and in no acute stress.  Vascular  Dorsalis pedis and posterior tibial  pulses are weakly  palpable  bilaterally.  Capillary return is within normal limits  bilaterally. Cold feet   Bilaterally.  Absent digital hair  B/L.  Neurologic  Senn-Weinstein monofilament wire test within normal limits  bilaterally. Muscle power within normal limits bilaterally.  Nails Thick disfigured discolored nails with subungual debris  from hallux to fifth toes bilaterally. No evidence of bacterial infection or drainage bilaterally.  Orthopedic  No limitations of motion  feet .  No crepitus or effusions noted.  No bony pathology or digital deformities noted.  Skin  normotropic skin with no porokeratosis noted bilaterally.  No signs of infections or ulcers noted.     Onychomycosis  Pain in right toes  Pain in left toes  Consent was obtained for treatment procedures.   Mechanical debridement of nails 1-5  bilaterally performed with a nail nipper.  Filed with dremel without incident.    Return office visit    3 months                  Told patient to return for periodic foot care and evaluation due to potential at risk complications.   Matheu Ploeger DPM  

## 2021-06-04 ENCOUNTER — Ambulatory Visit: Payer: Medicare Other | Admitting: Endocrinology

## 2021-06-23 ENCOUNTER — Encounter: Payer: Self-pay | Admitting: Endocrinology

## 2021-06-23 ENCOUNTER — Ambulatory Visit (INDEPENDENT_AMBULATORY_CARE_PROVIDER_SITE_OTHER): Payer: Medicare Other | Admitting: Endocrinology

## 2021-06-23 VITALS — BP 142/60 | HR 67 | Ht 62.0 in | Wt 126.2 lb

## 2021-06-23 DIAGNOSIS — E113599 Type 2 diabetes mellitus with proliferative diabetic retinopathy without macular edema, unspecified eye: Secondary | ICD-10-CM | POA: Diagnosis not present

## 2021-06-23 DIAGNOSIS — E7800 Pure hypercholesterolemia, unspecified: Secondary | ICD-10-CM

## 2021-06-23 LAB — POCT GLYCOSYLATED HEMOGLOBIN (HGB A1C): Hemoglobin A1C: 6 % — AB (ref 4.0–5.6)

## 2021-06-23 MED ORDER — SITAGLIPTIN PHOSPHATE 25 MG PO TABS
25.0000 mg | ORAL_TABLET | Freq: Every day | ORAL | 1 refills | Status: DC
Start: 2021-06-23 — End: 2021-11-25

## 2021-06-23 NOTE — Patient Instructions (Addendum)
Check blood sugars on waking up days a week  Also check blood sugars about 2 hours after meals and do this after different meals by rotation  Recommended blood sugar levels on waking up are 80-130 and about 2 hours after meal is 130-180  Please bring your blood sugar monitor to each visit, thank you  repaglinide 2 MG tablet Commonly known as: PRANDIN Take 1 tablet (2 mg total) by mouth 3 (two) times daily before a meal.  Stop Acabose

## 2021-06-23 NOTE — Progress Notes (Signed)
Patient ID: [name], female   DOB: 10-May-1950, 71 y.o.   MRN: [id number]           Reason for Appointment: Type II Diabetes follow-up   History of Present Illness   Diagnosis date: 2007  Previous history:  Not been able to continue metformin because of renal failure since 2020 She is instead been taking repaglinide 2 mg since 2021 and acarbose since 2022 In the past her A1c has ranged between 6.08.3 with the highest in 2021  Recent history:     Non-insulin hypoglycemic drugs: Acarbose 25 mg, 1/2 tablet, Januvia 100 mg daily and Prandin 2 mg Has not been on insulin            Side effects from medications: None  Current self management, blood sugar patterns and problems identified:  Her detailed previous history and medication history reviewed in detail  A1c is again around 6% Blood sugars are better assessed with fructosamine which generally stays over 300 Januvia was added in 1/23 with rising fructosamine However the patient does not check readings after meals and only fasting, did not bring a monitor for download Unclear whether her blood sugars have improved with Januvia Although she is supposed to take her Prandin before breakfast and dinner she is only taking it in the morning Also she takes acarbose only in the morning also No lab glucose available but in March her random glucose was 179 elsewhere  Exercise: Not able to do much because of knee pain   Diet management: She eats 3 meals a day with generally eggs, toast and meat in the morning and a protein and vegetables and a starch at lunch and dinner      Monitors blood glucose: Once a day.    Glucometer: One Touch.           Blood Glucose readings from recall:   PRE-MEAL Fasting Lunch Dinner Bedtime Overall  Glucose range: 90-140      Mean/median:        POST-MEAL PC Breakfast PC Lunch PC Dinner  Glucose range:   ?  Mean/median:        Hypoglycemia:  none                        Dietician  consultations with dialysis nutritionist only     Weight control:  Wt Readings from Last 3 Encounters:  06/23/21 126 lb 3.2 oz (57.2 kg)  02/05/21 121 lb (54.9 kg)  11/23/20 123 lb (55.8 kg)            Diabetes labs:  Lab Results  Component Value Date   HGBA1C 6.0 (A) 06/23/2021   HGBA1C 5.9 (A) 02/05/2021   HGBA1C 6.0 (A) 06/24/2020   Lab Results  Component Value Date   MICROALBUR 12.8 (H) 08/28/2015   LDLCALC 80 07/11/2018   CREATININE 3.88 (H) 09/04/2020   Lab Results  Component Value Date   FRUCTOSAMINE 382 (H) 02/05/2021   FRUCTOSAMINE 354 (H) 04/23/2020   FRUCTOSAMINE 369 (H) 02/24/2020     Allergies as of 06/23/2021       Reactions   Semaglutide Rash   Rybelsus 3 mg        Medication List        Accurate as of Jun 23, 2021  8:40 PM. If you have any questions, ask your nurse or doctor.          STOP taking these medications  acarbose 25 MG tablet Commonly known as: PRECOSE Stopped by: Elayne Snare, MD       TAKE these medications    acetaminophen 500 MG tablet Commonly known as: TYLENOL Take 1,000 mg by mouth every 6 (six) hours as needed for moderate pain.   allopurinol 100 MG tablet Commonly known as: ZYLOPRIM Take 1 tablet (100 mg total) by mouth daily.   amLODipine 10 MG tablet Commonly known as: NORVASC TAKE 1 TABLET BY MOUTH EVERY DAY   atorvastatin 80 MG tablet Commonly known as: LIPITOR TAKE 1 TABLET BY MOUTH EVERY DAY What changed:  how much to take how to take this when to take this additional instructions   bromocriptine 2.5 MG tablet Commonly known as: PARLODEL Take 0.5 tablets (1.25 mg total) by mouth daily.   Calcium Acetate 667 MG Tabs Take 1 tablet by mouth daily.   DIALYVITE 800 PO Take 1 tablet by mouth daily.   Dialyvite 800-Zinc 15 0.8 MG Tabs Take 1 tablet by mouth daily.   epoetin alfa-epbx 40000 UNIT/ML injection Commonly known as: RETACRIT Inject 40,000 Units into the skin every 28  (twenty-eight) days.   hydrALAZINE 50 MG tablet Commonly known as: APRESOLINE Take 1 tablet (50 mg total) by mouth every 8 (eight) hours. What changed: when to take this   hydrALAZINE 50 MG tablet Commonly known as: APRESOLINE Take by mouth. What changed: Another medication with the same name was changed. Make sure you understand how and when to take each.   isosorbide mononitrate 30 MG 24 hr tablet Commonly known as: IMDUR Take 1 tablet (30 mg total) by mouth daily.   lidocaine-prilocaine cream Commonly known as: EMLA SMARTSIG:Sparingly Topical 3 Times a Week   metoprolol succinate 100 MG 24 hr tablet Commonly known as: TOPROL-XL Take 100 mg by mouth daily.   minoxidil 10 MG tablet Commonly known as: LONITEN Take 10 mg by mouth 2 (two) times daily.   MIRCERA IJ Mircera   OneTouch Delica Lancets 10X Misc 1 each by Does not apply route 2 (two) times daily. E11.9   OneTouch Verio Flex System w/Device Kit 1 each by Does not apply route 2 (two) times daily. E11.9   OneTouch Verio test strip Generic drug: glucose blood USE AS DIRECTED TWICE DAILY   oxyCODONE-acetaminophen 5-325 MG tablet Commonly known as: Percocet Take 1 tablet by mouth every 6 (six) hours as needed for severe pain.   pantoprazole 40 MG tablet Commonly known as: PROTONIX Take 1 tablet (40 mg total) by mouth daily.   polyethylene glycol 17 g packet Commonly known as: MIRALAX / GLYCOLAX Take 34-51 g by mouth daily as needed for mild constipation.   repaglinide 2 MG tablet Commonly known as: PRANDIN Take 1 tablet (2 mg total) by mouth 2 (two) times daily before a meal.   sitaGLIPtin 25 MG tablet Commonly known as: Januvia Take 1 tablet (25 mg total) by mouth daily. What changed:  medication strength how much to take Changed by: Elayne Snare, MD   Vitamin D (Ergocalciferol) 1.25 MG (50000 UNIT) Caps capsule Commonly known as: DRISDOL Take 50,000 Units by mouth once a week.         Allergies:  Allergies  Allergen Reactions   Semaglutide Rash    Rybelsus 3 mg    Past Medical History:  Diagnosis Date   Acute kidney injury ([place]) 01/08/2015   Arthritis    Chronic back pain    Constipation    Cough    Diabetes mellitus,  type 2 (HCC)    Diverticulosis    Fibroid    patient thinks this was the reason for her hysterectomy   GERD (gastroesophageal reflux disease)    Heart murmur    History of sebaceous cyst    Hyperlipemia    Hyperplastic colon polyp    Hypertension    Hyperthyroidism    Lumbar radiculopathy    Shortness of breath 09/13/2013   Stroke ([facility]) 2015?   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    Past Surgical History:  Procedure Laterality Date   ABDOMINAL HYSTERECTOMY     --?ovaries remain   AV FISTULA PLACEMENT Left 07/16/2020   Procedure: LEFT BRACHIOCEPHALIC ARTERIOVENOUS (AV) FISTULA CREATION;  Surgeon: Leonie Douglas, MD;  Location: [facility] OR;  Service: Vascular;  Laterality: Left;  PERIPHERAL NERVE BLOCK   COLONOSCOPY W/ BIOPSIES  09/11/2008   ESOPHAGOGASTRODUODENOSCOPY (EGD) WITH PROPOFOL N/A 05/20/2020   Procedure: ESOPHAGOGASTRODUODENOSCOPY (EGD) WITH PROPOFOL;  Surgeon: Hilarie Fredrickson, MD;  Location[facility]COPY;  Service: Endoscopy;  Laterality: N/A;   GIVENS CAPSULE STUDY N/A 06/28/2020   Procedure: GIVENS CAPSULE STUDY;  Surgeon: Hilarie Fredrickson, MD;  Location: [facility] ENDOSCOPY;  Service: Endoscopy;  Laterality: N/A;   RETINOPATHY SURGERY Bilateral 2013   Dr. Ashley Royalty    Family History  Problem Relation Age of Onset   Hypertension Mother    Sleep apnea Mother    Diabetes Mother    Hyperlipidemia Mother    Lung cancer Father        smoker   Hypertension Sister    Diabetes Sister    Hypertension Brother    Hypertension Sister    Diabetes Brother    Hypertension Brother    Breast cancer Neg Hx    Colon cancer Neg Hx    Esophageal cancer Neg Hx    Pancreatic cancer Neg Hx     Liver disease Neg Hx    Colon polyps Neg Hx     Social History:  reports that she quit smoking about 6 years ago. Her smoking use included cigarettes. She has a 17.50 pack-year smoking history. She has never used smokeless tobacco. She reports that she does not drink alcohol and does not use drugs.  Review of Systems:  Last diabetic eye exam date Dr Oretha Milch  Last foot exam date:6/22   Hypertension: Managed by nephrologist  BP Readings from Last 3 Encounters:  06/23/21 (!) 142/60  02/05/21 (!) 160/82  11/23/20 128/60   She has been on dialysis for ESRD since 10/22  Lipids: No recent labs available, followed by PCP on atorvastatin 80 mg No history of coronary artery disease, may have PVD    Lab Results  Component Value Date   CHOL 155 07/11/2018   CHOL 157 07/10/2017   CHOL 141 01/11/2016   Lab Results  Component Value Date   HDL 44.70 07/11/2018   HDL 39.40 07/10/2017   HDL 38.40 (L) 01/11/2016   Lab Results  Component Value Date   LDLCALC 80 07/11/2018   LDLCALC 84 01/11/2016   LDLCALC 75 01/15/2015   Lab Results  Component Value Date   TRIG 151.0 (H) 07/11/2018   TRIG 270.0 (H) 07/10/2017   TRIG 95.0 01/11/2016   Lab Results  Component Value Date   CHOLHDL 3 07/11/2018   CHOLHDL 4 07/10/2017   CHOLHDL 4 01/11/2016   Lab Results  Component Value Date  LDLDIRECT 79.0 07/10/2017   LDLDIRECT 130.4 12/13/2012   LDLDIRECT 187.4 12/13/2011    No history of thyroid dysfunction but in 2018 had a 1.8 cm right thyroid nodule which was benign on biopsy  Lab Results  Component Value Date   TSH 1.83 09/24/2019   TSH 1.854 09/09/2017   TSH 1.56 09/08/2017     Examination:   BP (!) 142/60   Pulse 67   Ht $R'5\' 2"'Dl$  (1.575 m)   Wt 126 lb 3.2 oz (57.2 kg)   SpO2 99%   BMI 23.08 kg/m   Body mass index is 23.08 kg/m.    ASSESSMENT/ PLAN:    Diabetes type 2 non-insulin-dependent:   Current regimen: Acarbose, Prandin and Januvia  Blood  glucose control is somewhat difficult to assess because of likely falsely low A1c as well as lack of monitoring at home after meals  Although she likely has high postprandial readings judging from her fructosamine she is only taking Prandin in the morning as well as only 12.5 mg acarbose in the morning Although she is not overweight she is not able to exercise because of knee pain Diet appears to be fairly good  Discussed in detail the need to control postprandial blood sugars also and blood sugar targets Since acarbose may not be effective and may not be desirable because of renal failure will stop this  She will continue Januvia which is more affordable than other brand-name medications but use an appropriate dose for renal failure of 25 mg only  REPAGLINIDE will be given before each meal and discussed timing of taking this, actions of the medication and potential for low blood sugars Also check fructosamine on the next visit  LIPID management: Will defer to PCP as these are being managed elsewhere  Given her list of PCPs available in town to establish  Patient Instructions  Check blood sugars on waking up days a week  Also check blood sugars about 2 hours after meals and do this after different meals by rotation  Recommended blood sugar levels on waking up are 80-130 and about 2 hours after meal is 130-180  Please bring your blood sugar monitor to each visit, thank you  repaglinide 2 MG tablet Commonly known as: PRANDIN Take 1 tablet (2 mg total) by mouth 3 (two) times daily before a meal.  Stop Acabose   Elayne Snare 06/23/2021, 8:40 PM   Total visit time including counseling = 30 minutes

## 2021-07-02 ENCOUNTER — Ambulatory Visit: Payer: Medicare Other | Admitting: Podiatry

## 2021-08-04 ENCOUNTER — Ambulatory Visit (INDEPENDENT_AMBULATORY_CARE_PROVIDER_SITE_OTHER): Payer: Medicare Other | Admitting: Podiatry

## 2021-08-04 ENCOUNTER — Encounter: Payer: Self-pay | Admitting: Podiatry

## 2021-08-04 DIAGNOSIS — M79675 Pain in left toe(s): Secondary | ICD-10-CM | POA: Diagnosis not present

## 2021-08-04 DIAGNOSIS — M79674 Pain in right toe(s): Secondary | ICD-10-CM | POA: Diagnosis not present

## 2021-08-04 DIAGNOSIS — E1151 Type 2 diabetes mellitus with diabetic peripheral angiopathy without gangrene: Secondary | ICD-10-CM

## 2021-08-04 DIAGNOSIS — B351 Tinea unguium: Secondary | ICD-10-CM | POA: Diagnosis not present

## 2021-08-04 DIAGNOSIS — N184 Chronic kidney disease, stage 4 (severe): Secondary | ICD-10-CM

## 2021-08-04 NOTE — Progress Notes (Signed)
This patient returns to my office for at risk foot care.  This patient requires this care by a professional since this patient will be at risk due to having type 2 diabetes and CKD.    This patient is unable to cut nails herself since the patient cannot reach her nails.These nails are painful walking and wearing shoes.  This patient presents for at risk foot care today.  General Appearance  Alert, conversant and in no acute stress.  Vascular  Dorsalis pedis and posterior tibial  pulses are weakly  palpable  bilaterally.  Capillary return is within normal limits  bilaterally. Cold feet   Bilaterally.  Absent digital hair  B/L.  Neurologic  Senn-Weinstein monofilament wire test within normal limits  bilaterally. Muscle power within normal limits bilaterally.  Nails Thick disfigured discolored nails with subungual debris  from hallux to fifth toes bilaterally. No evidence of bacterial infection or drainage bilaterally.  Orthopedic  No limitations of motion  feet .  No crepitus or effusions noted.  No bony pathology or digital deformities noted.  Skin  normotropic skin with no porokeratosis noted bilaterally.  No signs of infections or ulcers noted.     Onychomycosis  Pain in right toes  Pain in left toes  Consent was obtained for treatment procedures.   Mechanical debridement of nails 1-5  bilaterally performed with a nail nipper.  Filed with dremel without incident.    Return office visit    3 months                  Told patient to return for periodic foot care and evaluation due to potential at risk complications.   Gardiner Barefoot DPM

## 2021-09-29 ENCOUNTER — Other Ambulatory Visit (INDEPENDENT_AMBULATORY_CARE_PROVIDER_SITE_OTHER): Payer: Medicare Other

## 2021-09-29 DIAGNOSIS — E7800 Pure hypercholesterolemia, unspecified: Secondary | ICD-10-CM

## 2021-09-29 DIAGNOSIS — E113599 Type 2 diabetes mellitus with proliferative diabetic retinopathy without macular edema, unspecified eye: Secondary | ICD-10-CM

## 2021-09-30 LAB — LDL CHOLESTEROL, DIRECT: Direct LDL: 69 mg/dL

## 2021-09-30 LAB — GLUCOSE, RANDOM: Glucose, Bld: 121 mg/dL — ABNORMAL HIGH (ref 70–99)

## 2021-09-30 LAB — FRUCTOSAMINE: Fructosamine: 294 umol/L — ABNORMAL HIGH (ref 0–285)

## 2021-10-04 ENCOUNTER — Encounter: Payer: Self-pay | Admitting: Endocrinology

## 2021-10-04 ENCOUNTER — Ambulatory Visit (INDEPENDENT_AMBULATORY_CARE_PROVIDER_SITE_OTHER): Payer: Medicare Other | Admitting: Endocrinology

## 2021-10-04 VITALS — BP 122/50 | HR 69 | Ht 62.0 in | Wt 128.0 lb

## 2021-10-04 DIAGNOSIS — E113599 Type 2 diabetes mellitus with proliferative diabetic retinopathy without macular edema, unspecified eye: Secondary | ICD-10-CM | POA: Diagnosis not present

## 2021-10-04 NOTE — Progress Notes (Signed)
Patient ID: [name], female   DOB: 23-Jul-1950, 71 y.o.   MRN: [id number]           Reason for Appointment: Type II Diabetes follow-up   History of Present Illness   Diagnosis date: 2007  Previous history:  Not been able to continue metformin because of renal failure since 2020 She is instead been taking repaglinide 2 mg since 2021 and acarbose since 2022 In the past her A1c has ranged between 6.08.3 with the highest in 2021  Recent history:     Non-insulin hypoglycemic drugs: Januvia 100 mg daily and Prandin 2 mg Has not been on insulin            Side effects from medications: None  Current self management, blood sugar patterns and problems identified:  A1c is last around 6% Blood sugars are better assessed with fructosamine which is now 294 compared to 382 She again does not check her blood sugars much and only in the morning, as before did not bring her monitor  She thinks her blood sugars are in the low 100 range in the mornings and likely has not checked many readings recently Her blood sugar after lunch was 121 in the lab  Acarbose was stopped on the last visit since it was likely ineffective with once a day dose  Weight is about the same  Exercise: Not able to do much because of knee pain   Diet management: She eats 2-3 meals a day with generally eggs, toast and meat in the morning and a protein and vegetables and a starch at lunch and dinner      Monitors blood glucose: Once a day.    Glucometer: One Touch.           Blood Glucose readings as above   Hypoglycemia:  none                        Dietician consultations with dialysis nutritionist only     Weight control:  Wt Readings from Last 3 Encounters:  10/04/21 128 lb (58.1 kg)  06/23/21 126 lb 3.2 oz (57.2 kg)  02/05/21 121 lb (54.9 kg)            Diabetes labs:  Lab Results  Component Value Date   HGBA1C 6.0 (A) 06/23/2021   HGBA1C 5.9 (A) 02/05/2021   HGBA1C 6.0 (A) 06/24/2020   Lab  Results  Component Value Date   MICROALBUR 12.8 (H) 08/28/2015   LDLCALC 80 07/11/2018   CREATININE 3.88 (H) 09/04/2020   Lab Results  Component Value Date   FRUCTOSAMINE 294 (H) 09/29/2021   FRUCTOSAMINE 382 (H) 02/05/2021   FRUCTOSAMINE 354 (H) 04/23/2020     Allergies as of 10/04/2021       Reactions   Semaglutide Rash   Rybelsus 3 mg Rybelsus 3 mg Rybelsus 3 mg Rybelsus 3 mg        Medication List        Accurate as of October 04, 2021  8:29 PM. If you have any questions, ask your nurse or doctor.          acetaminophen 500 MG tablet Commonly known as: TYLENOL Take 1,000 mg by mouth every 6 (six) hours as needed for moderate pain.   allopurinol 100 MG tablet Commonly known as: ZYLOPRIM Take 1 tablet (100 mg total) by mouth daily.   amLODipine 10 MG tablet Commonly known as: NORVASC TAKE 1 TABLET BY MOUTH EVERY  DAY   atorvastatin 80 MG tablet Commonly known as: LIPITOR TAKE 1 TABLET BY MOUTH EVERY DAY What changed:  how much to take how to take this when to take this additional instructions   bromocriptine 2.5 MG tablet Commonly known as: PARLODEL Take 0.5 tablets (1.25 mg total) by mouth daily.   Calcium Acetate 667 MG Tabs Take 1 tablet by mouth daily.   DIALYVITE 800 PO Take 1 tablet by mouth daily.   Dialyvite 800-Zinc 15 0.8 MG Tabs Take 1 tablet by mouth daily.   epoetin alfa-epbx 40000 UNIT/ML injection Commonly known as: RETACRIT Inject 40,000 Units into the skin every 28 (twenty-eight) days.   hydrALAZINE 50 MG tablet Commonly known as: APRESOLINE Take 1 tablet (50 mg total) by mouth every 8 (eight) hours. What changed: when to take this   hydrALAZINE 50 MG tablet Commonly known as: APRESOLINE Take by mouth. What changed: Another medication with the same name was changed. Make sure you understand how and when to take each.   isosorbide mononitrate 30 MG 24 hr tablet Commonly known as: IMDUR Take 1 tablet (30 mg total)  by mouth daily.   lidocaine-prilocaine cream Commonly known as: EMLA SMARTSIG:Sparingly Topical 3 Times a Week   metoprolol succinate 100 MG 24 hr tablet Commonly known as: TOPROL-XL Take 100 mg by mouth daily.   minoxidil 10 MG tablet Commonly known as: LONITEN Take 10 mg by mouth 2 (two) times daily.   MIRCERA IJ Mircera   OneTouch Delica Lancets 23N Misc 1 each by Does not apply route 2 (two) times daily. E11.9   OneTouch Verio Flex System w/Device Kit 1 each by Does not apply route 2 (two) times daily. E11.9   OneTouch Verio test strip Generic drug: glucose blood USE AS DIRECTED TWICE DAILY   oxyCODONE-acetaminophen 5-325 MG tablet Commonly known as: Percocet Take 1 tablet by mouth every 6 (six) hours as needed for severe pain.   pantoprazole 40 MG tablet Commonly known as: PROTONIX Take 1 tablet (40 mg total) by mouth daily.   polyethylene glycol 17 g packet Commonly known as: MIRALAX / GLYCOLAX Take 34-51 g by mouth daily as needed for mild constipation.   repaglinide 2 MG tablet Commonly known as: PRANDIN Take 1 tablet (2 mg total) by mouth 2 (two) times daily before a meal.   sitaGLIPtin 25 MG tablet Commonly known as: Januvia Take 1 tablet (25 mg total) by mouth daily.   Vitamin D (Ergocalciferol) 1.25 MG (50000 UNIT) Caps capsule Commonly known as: DRISDOL Take 50,000 Units by mouth once a week.        Allergies:  Allergies  Allergen Reactions   Semaglutide Rash    Rybelsus 3 mg Rybelsus 3 mg Rybelsus 3 mg Rybelsus 3 mg    Past Medical History:  Diagnosis Date   Acute kidney injury ([place]) 01/08/2015   Arthritis    Chronic back pain    Constipation    Cough    Diabetes mellitus, type 2 ([place])    Diverticulosis    Fibroid    patient thinks this was the reason for her hysterectomy   GERD (gastroesophageal reflux disease)    Heart murmur    History of sebaceous cyst    Hyperlipemia    Hyperplastic colon polyp    Hypertension     Hyperthyroidism    Lumbar radiculopathy    Shortness of breath 09/13/2013   Stroke ([place]) 2015?   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    Past Surgical History:  Procedure Laterality Date   ABDOMINAL HYSTERECTOMY     --?ovaries remain   AV FISTULA PLACEMENT Left 07/16/2020   Procedure: LEFT BRACHIOCEPHALIC ARTERIOVENOUS (AV) FISTULA CREATION;  Surgeon: Cherre Robins, MD;  Location: [place] OR;  Service: Vascular;  Laterality: Left;  PERIPHERAL NERVE BLOCK   COLONOSCOPY W/ BIOPSIES  09/11/2008   ESOPHAGOGASTRODUODENOSCOPY (EGD) WITH PROPOFOL N/A 05/20/2020   Procedure: ESOPHAGOGASTRODUODENOSCOPY (EGD) WITH PROPOFOL;  Surgeon: Irene Shipper, MD;  Location[facility] ENDOSCOPY;  Service: Endoscopy;  Laterality: N/A;   GIVENS CAPSULE STUDY N/A 06/28/2020   Procedure: GIVENS CAPSULE STUDY;  Surgeon: Irene Shipper, MD;  Location: [facility];  Service: Endoscopy;  Laterality: N/A;   RETINOPATHY SURGERY Bilateral 2013   Dr. Zigmund Daniel    Family History  Problem Relation Age of Onset   Hypertension Mother    Sleep apnea Mother    Diabetes Mother    Hyperlipidemia Mother    Lung cancer Father        smoker   Hypertension Sister    Diabetes Sister    Hypertension Brother    Hypertension Sister    Diabetes Brother    Hypertension Brother    Breast cancer Neg Hx    Colon cancer Neg Hx    Esophageal cancer Neg Hx    Pancreatic cancer Neg Hx    Liver disease Neg Hx    Colon polyps Neg Hx     Social History:  reports that she quit smoking about 7 years ago. Her smoking use included cigarettes. She has a 17.50 pack-year smoking history. She has never used smokeless tobacco. She reports that she does not drink alcohol and does not use drugs.  Review of Systems:  Last diabetic eye exam date Dr Madelyn Brunner  Last foot exam date:6/22   Hypertension: Managed by nephrologist  BP Readings from Last 3 Encounters:  10/04/21 (!) 122/50   06/23/21 (!) 142/60  02/05/21 (!) 160/82   She has been on dialysis for ESRD since 10/22  Lipids:  followed by PCP on atorvastatin 80 mg No history of coronary artery disease, may have PVD  LDL is now 31    Lab Results  Component Value Date   CHOL 155 07/11/2018   CHOL 157 07/10/2017   CHOL 141 01/11/2016   Lab Results  Component Value Date   HDL 44.70 07/11/2018   HDL 39.40 07/10/2017   HDL 38.40 (L) 01/11/2016   Lab Results  Component Value Date   LDLCALC 80 07/11/2018   [place] 84 01/11/2016   [place] 75 01/15/2015   Lab Results  Component Value Date   TRIG 151.0 (H) 07/11/2018   TRIG 270.0 (H) 07/10/2017   TRIG 95.0 01/11/2016   Lab Results  Component Value Date   CHOLHDL 3 07/11/2018   CHOLHDL 4 07/10/2017   CHOLHDL 4 01/11/2016   Lab Results  Component Value Date   LDLDIRECT 69.0 09/29/2021   LDLDIRECT 79.0 07/10/2017   LDLDIRECT 130.4 12/13/2012    No history of thyroid dysfunction but in 2018 had a 1.8 cm right thyroid nodule which was benign on biopsy  Lab Results  Component Value Date   TSH 1.83 09/24/2019   TSH 1.854 09/09/2017   TSH 1.56 09/08/2017     Examination:   BP (!) 122/50   Pulse 69   Ht $R'5\' 2"'Eu$  (1.575 m)   Wt 128 lb (  58.1 kg)   SpO2 98%   BMI 23.41 kg/m   Body mass index is 23.41 kg/m.    ASSESSMENT/ PLAN:    Diabetes type 2 non-insulin-dependent:   Current regimen:  Prandin and Januvia  Her A1c has been around 6% Now fructosamine is 294 which is improved from before  Likely has had some improvement with continuing Januvia Apparently cannot afford brand-name medications Has done likely better with timing her repaglinide with every meal as directed on the last visit  She will continue the same regimen Emphasized the need for starting to check her blood sugars regularly especially after meals  LIPID management: LDL is at target   Patient Instructions  Check blood sugars on waking up 1-2 days a week  Also  check blood sugars about 2 hours after meals and do this after different meals by rotation  Recommended blood sugar levels on waking up are 90-130 and about 2 hours after meal is 130-160  Please bring your blood sugar monitor to each visit, thank you    Elayne Snare 10/04/2021, 8:29 PM   Total visit time including counseling = 30 minutes

## 2021-10-04 NOTE — Patient Instructions (Signed)
Check blood sugars on waking up 1-2 days a week  Also check blood sugars about 2 hours after meals and do this after different meals by rotation  Recommended blood sugar levels on waking up are 90-130 and about 2 hours after meal is 130-160  Please bring your blood sugar monitor to each visit, thank you

## 2021-10-29 ENCOUNTER — Other Ambulatory Visit: Payer: Self-pay

## 2021-10-29 DIAGNOSIS — E113599 Type 2 diabetes mellitus with proliferative diabetic retinopathy without macular edema, unspecified eye: Secondary | ICD-10-CM

## 2021-10-29 MED ORDER — REPAGLINIDE 2 MG PO TABS: 2.0000 mg | ORAL_TABLET | Freq: Two times a day (BID) | ORAL | 3 refills | Status: DC

## 2021-11-10 ENCOUNTER — Ambulatory Visit: Payer: Medicare Other | Admitting: Podiatry

## 2021-11-12 ENCOUNTER — Ambulatory Visit (INDEPENDENT_AMBULATORY_CARE_PROVIDER_SITE_OTHER): Payer: Medicare Other | Admitting: Podiatry

## 2021-11-12 ENCOUNTER — Encounter: Payer: Self-pay | Admitting: Podiatry

## 2021-11-12 DIAGNOSIS — B351 Tinea unguium: Secondary | ICD-10-CM | POA: Diagnosis not present

## 2021-11-12 DIAGNOSIS — M79674 Pain in right toe(s): Secondary | ICD-10-CM

## 2021-11-12 DIAGNOSIS — N184 Chronic kidney disease, stage 4 (severe): Secondary | ICD-10-CM

## 2021-11-12 DIAGNOSIS — M79675 Pain in left toe(s): Secondary | ICD-10-CM | POA: Diagnosis not present

## 2021-11-12 DIAGNOSIS — E1151 Type 2 diabetes mellitus with diabetic peripheral angiopathy without gangrene: Secondary | ICD-10-CM | POA: Diagnosis not present

## 2021-11-12 NOTE — Progress Notes (Signed)
This patient returns to my office for at risk foot care.  This patient requires this care by a professional since this patient will be at risk due to having type 2 diabetes and CKD.    This patient is unable to cut nails herself since the patient cannot reach her nails.These nails are painful walking and wearing shoes.  This patient presents for at risk foot care today.  General Appearance  Alert, conversant and in no acute stress.  Vascular  Dorsalis pedis and posterior tibial  pulses are weakly  palpable  bilaterally.  Capillary return is within normal limits  bilaterally. Cold feet   Bilaterally.  Absent digital hair  B/L.  Neurologic  Senn-Weinstein monofilament wire test within normal limits  bilaterally. Muscle power within normal limits bilaterally.  Nails Thick disfigured discolored nails with subungual debris  from hallux to fifth toes bilaterally. No evidence of bacterial infection or drainage bilaterally.  Orthopedic  No limitations of motion  feet .  No crepitus or effusions noted.  No bony pathology or digital deformities noted.  HAV  B/L.  Skin  normotropic skin with no porokeratosis noted bilaterally.  No signs of infections or ulcers noted.     Onychomycosis  Pain in right toes  Pain in left toes  Consent was obtained for treatment procedures.   Mechanical debridement of nails 1-5  bilaterally performed with a nail nipper.  Filed with dremel without incident.    Return office visit    3 months                  Told patient to return for periodic foot care and evaluation due to potential at risk complications.   Gardiner Barefoot DPM

## 2021-11-17 ENCOUNTER — Ambulatory Visit (INDEPENDENT_AMBULATORY_CARE_PROVIDER_SITE_OTHER): Payer: Medicare Other | Admitting: Podiatrist

## 2021-11-17 DIAGNOSIS — M79676 Pain in unspecified toe(s): Secondary | ICD-10-CM

## 2021-11-17 NOTE — Progress Notes (Signed)
Chief Complaint  Patient presents with   Ingrown Toenail    Possible ingrown , pain varies , patient states is a diabetic. No redness no discharge or swelling has been on going for 2-3 months      HPI: Patient is 71 y.o. female who presents today for painful left and right great toenails.  She was recently seen and Dr. Prudence Davidson treated her nails.  She relates the nails are still long and feel like they are growing into the sides.  She denies any drainage to the corners.  Denies any NVFCNS.    Allergies  Allergen Reactions   Tape Other (See Comments)    Paper tape rips skin.    Semaglutide Rash    Rybelsus 3 mg    Review of systems is negative except as noted in the HPI.  Denies nausea/ vomiting/ fevers/ chills or night sweats.   Denies difficulty breathing, denies calf pain or tenderness  Physical Exam  Patient is awake, alert, and oriented x 3.  In no acute distress.    Vascular status is intact with faintly palpable pedal pulses DP and PT bilateral and capillary refill time less than 3 seconds bilateral.  No edema or erythema noted.   Neurological exam reveals epicritic and protective sensation grossly intact bilateral.   Dermatological exam reveals skin is supple and dry to bilateral feet.  No open lesions present.  Bilateral great toenails are too long despite being trimmed 5 days ago.  Remainder of toenails are not bothersome.   Musculoskeletal exam: Musculature intact with dorsiflexion, plantarflexion, inversion, eversion. Ankle and Hallux abductovalgus bilateral.     Assessment:   ICD-10-CM   1. Pain around toenail  M79.676        Plan: Trimmed the bothersome nails back and smoothed as a courtesy.  She will be seen back in 3 months for at risk routine foot care with Dr. Elisha Ponder. If any problems arise in the meantime she will call.

## 2021-11-22 ENCOUNTER — Other Ambulatory Visit: Payer: Self-pay

## 2021-11-22 ENCOUNTER — Observation Stay (HOSPITAL_COMMUNITY)
Admission: EM | Admit: 2021-11-22 | Discharge: 2021-11-25 | Disposition: A | Discharge status: Home / Self Care | Payer: Medicare Other | Attending: Internal Medicine | Admitting: Internal Medicine

## 2021-11-22 ENCOUNTER — Encounter (HOSPITAL_COMMUNITY): Payer: Self-pay

## 2021-11-22 ENCOUNTER — Emergency Department (HOSPITAL_COMMUNITY): Payer: Medicare Other

## 2021-11-22 DIAGNOSIS — M1A3620 Chronic gout due to renal impairment, left knee, without tophus (tophi): Secondary | ICD-10-CM | POA: Diagnosis present

## 2021-11-22 DIAGNOSIS — U071 COVID-19: Secondary | ICD-10-CM | POA: Diagnosis present

## 2021-11-22 DIAGNOSIS — E1122 Type 2 diabetes mellitus with diabetic chronic kidney disease: Secondary | ICD-10-CM | POA: Insufficient documentation

## 2021-11-22 DIAGNOSIS — Z79899 Other long term (current) drug therapy: Secondary | ICD-10-CM | POA: Insufficient documentation

## 2021-11-22 DIAGNOSIS — Z8673 Personal history of transient ischemic attack (TIA), and cerebral infarction without residual deficits: Secondary | ICD-10-CM | POA: Diagnosis not present

## 2021-11-22 DIAGNOSIS — N186 End stage renal disease: Secondary | ICD-10-CM | POA: Diagnosis not present

## 2021-11-22 DIAGNOSIS — R269 Unspecified abnormalities of gait and mobility: Secondary | ICD-10-CM | POA: Insufficient documentation

## 2021-11-22 DIAGNOSIS — E113599 Type 2 diabetes mellitus with proliferative diabetic retinopathy without macular edema, unspecified eye: Secondary | ICD-10-CM | POA: Diagnosis present

## 2021-11-22 DIAGNOSIS — R2681 Unsteadiness on feet: Secondary | ICD-10-CM | POA: Diagnosis not present

## 2021-11-22 DIAGNOSIS — E871 Hypo-osmolality and hyponatremia: Secondary | ICD-10-CM

## 2021-11-22 DIAGNOSIS — Z992 Dependence on renal dialysis: Secondary | ICD-10-CM | POA: Diagnosis not present

## 2021-11-22 DIAGNOSIS — M1A9XX0 Chronic gout, unspecified, without tophus (tophi): Secondary | ICD-10-CM | POA: Diagnosis not present

## 2021-11-22 DIAGNOSIS — F17210 Nicotine dependence, cigarettes, uncomplicated: Secondary | ICD-10-CM | POA: Insufficient documentation

## 2021-11-22 DIAGNOSIS — I120 Hypertensive chronic kidney disease with stage 5 chronic kidney disease or end stage renal disease: Secondary | ICD-10-CM | POA: Diagnosis not present

## 2021-11-22 DIAGNOSIS — E11649 Type 2 diabetes mellitus with hypoglycemia without coma: Secondary | ICD-10-CM | POA: Diagnosis not present

## 2021-11-22 DIAGNOSIS — Z7409 Other reduced mobility: Secondary | ICD-10-CM | POA: Insufficient documentation

## 2021-11-22 DIAGNOSIS — E162 Hypoglycemia, unspecified: Secondary | ICD-10-CM | POA: Diagnosis present

## 2021-11-22 DIAGNOSIS — F17200 Nicotine dependence, unspecified, uncomplicated: Secondary | ICD-10-CM | POA: Diagnosis present

## 2021-11-22 DIAGNOSIS — E785 Hyperlipidemia, unspecified: Secondary | ICD-10-CM | POA: Diagnosis present

## 2021-11-22 LAB — BASIC METABOLIC PANEL
Anion gap: 10 (ref 5–15)
BUN: 50 mg/dL — ABNORMAL HIGH (ref 8–23)
CO2: 24 mmol/L (ref 22–32)
Calcium: 8.7 mg/dL — ABNORMAL LOW (ref 8.9–10.3)
Chloride: 93 mmol/L — ABNORMAL LOW (ref 98–111)
Creatinine, Ser: 6.69 mg/dL — ABNORMAL HIGH (ref 0.44–1.00)
GFR, Estimated: 6 mL/min — ABNORMAL LOW (ref >60)
Glucose, Bld: 78 mg/dL (ref 70–99)
Potassium: 3.6 mmol/L (ref 3.5–5.1)
Sodium: 127 mmol/L — ABNORMAL LOW (ref 135–145)

## 2021-11-22 LAB — CBG MONITORING, ED
Glucose-Capillary: 92 mg/dL (ref 70–99)
Glucose-Capillary: 126 mg/dL — ABNORMAL HIGH (ref 70–99)
Glucose-Capillary: 86 mg/dL (ref 70–99)
Glucose-Capillary: 94 mg/dL (ref 70–99)
Glucose-Capillary: 63 mg/dL — ABNORMAL LOW (ref 70–99)
Glucose-Capillary: 119 mg/dL — ABNORMAL HIGH (ref 70–99)

## 2021-11-22 LAB — CBC WITH DIFFERENTIAL/PLATELET
Abs Immature Granulocytes: 0.02 10*3/uL (ref 0.00–0.07)
Basophils Absolute: 0 10*3/uL (ref 0.0–0.1)
Basophils Relative: 0 %
Eosinophils Absolute: 0 10*3/uL (ref 0.0–0.5)
Eosinophils Relative: 1 %
HCT: 31.1 % — ABNORMAL LOW (ref 36.0–46.0)
Hemoglobin: 10.6 g/dL — ABNORMAL LOW (ref 12.0–15.0)
Immature Granulocytes: 0 %
Lymphocytes Relative: 13 %
Lymphs Abs: 0.8 10*3/uL (ref 0.7–4.0)
MCH: 34.5 pg — ABNORMAL HIGH (ref 26.0–34.0)
MCHC: 34.1 g/dL (ref 30.0–36.0)
MCV: 101.3 fL — ABNORMAL HIGH (ref 80.0–100.0)
Monocytes Absolute: 0.6 10*3/uL (ref 0.1–1.0)
Monocytes Relative: 10 %
Neutro Abs: 4.7 10*3/uL (ref 1.7–7.7)
Neutrophils Relative %: 76 %
Platelets: 180 10*3/uL (ref 150–400)
RBC: 3.07 MIL/uL — ABNORMAL LOW (ref 3.87–5.11)
RDW: 14 % (ref 11.5–15.5)
WBC: 6.2 10*3/uL (ref 4.0–10.5)
nRBC: 0 % (ref 0.0–0.2)

## 2021-11-22 LAB — COMPREHENSIVE METABOLIC PANEL
ALT: 32 U/L (ref 0–44)
AST: 50 U/L — ABNORMAL HIGH (ref 15–41)
Albumin: 3.3 g/dL — ABNORMAL LOW (ref 3.5–5.0)
Alkaline Phosphatase: 58 U/L (ref 38–126)
Anion gap: 12 (ref 5–15)
BUN: 50 mg/dL — ABNORMAL HIGH (ref 8–23)
CO2: 23 mmol/L (ref 22–32)
Calcium: 9 mg/dL (ref 8.9–10.3)
Chloride: 93 mmol/L — ABNORMAL LOW (ref 98–111)
Creatinine, Ser: 6.85 mg/dL — ABNORMAL HIGH (ref 0.44–1.00)
GFR, Estimated: 6 mL/min — ABNORMAL LOW (ref >60)
Glucose, Bld: 97 mg/dL (ref 70–99)
Potassium: 3.7 mmol/L (ref 3.5–5.1)
Sodium: 128 mmol/L — ABNORMAL LOW (ref 135–145)
Total Bilirubin: 0.8 mg/dL (ref 0.3–1.2)
Total Protein: 6.8 g/dL (ref 6.5–8.1)

## 2021-11-22 LAB — GLUCOSE, CAPILLARY: Glucose-Capillary: 95 mg/dL (ref 70–99)

## 2021-11-22 LAB — SARS CORONAVIRUS 2 BY RT PCR: SARS Coronavirus 2 by RT PCR: POSITIVE — AB

## 2021-11-22 MED ORDER — HYDROXYZINE HCL 25 MG PO TABS: 25.0000 mg | ORAL_TABLET | Freq: Three times a day (TID) | ORAL | Status: DC | PRN

## 2021-11-22 MED ORDER — ONDANSETRON HCL 4 MG/2ML IJ SOLN: 4.0000 mg | Freq: Four times a day (QID) | INTRAMUSCULAR | Status: DC | PRN

## 2021-11-22 MED ORDER — ONDANSETRON HCL 4 MG PO TABS: 4.0000 mg | ORAL_TABLET | Freq: Four times a day (QID) | ORAL | Status: DC | PRN

## 2021-11-22 MED ORDER — OXYCODONE-ACETAMINOPHEN 5-325 MG PO TABS: 1.0000 | ORAL_TABLET | Freq: Four times a day (QID) | ORAL | Status: DC | PRN

## 2021-11-22 MED ORDER — CALCIUM ACETATE (PHOS BINDER) 667 MG PO CAPS
667.0000 mg | ORAL_CAPSULE | Freq: Every day | ORAL | Status: DC
  Administered 2021-11-23 – 2021-11-25 (×3): 667 mg via ORAL
  Filled 2021-11-22 (×3): qty 1

## 2021-11-22 MED ORDER — DEXTROSE 10 % IV SOLN: INTRAVENOUS | Status: DC

## 2021-11-22 MED ORDER — ALLOPURINOL 100 MG PO TABS
100.0000 mg | ORAL_TABLET | Freq: Every day | ORAL | Status: DC
  Administered 2021-11-22 – 2021-11-25 (×4): 100 mg via ORAL
  Filled 2021-11-22 (×5): qty 1

## 2021-11-22 MED ORDER — SODIUM CHLORIDE 0.9 % IV BOLUS
250.0000 mL | Freq: Once | INTRAVENOUS | Status: AC
  Administered 2021-11-22: 250 mL via INTRAVENOUS

## 2021-11-22 MED ORDER — CAMPHOR-MENTHOL 0.5-0.5 % EX LOTN: 1.0000 | TOPICAL_LOTION | Freq: Three times a day (TID) | CUTANEOUS | Status: DC | PRN

## 2021-11-22 MED ORDER — ACETAMINOPHEN 325 MG PO TABS: 650.0000 mg | ORAL_TABLET | Freq: Four times a day (QID) | ORAL | Status: DC | PRN

## 2021-11-22 MED ORDER — CALCIUM CARBONATE ANTACID 1250 MG/5ML PO SUSP: 500.0000 mg | Freq: Four times a day (QID) | ORAL | Status: DC | PRN

## 2021-11-22 MED ORDER — ALBUTEROL SULFATE (2.5 MG/3ML) 0.083% IN NEBU: 3.0000 mL | INHALATION_SOLUTION | RESPIRATORY_TRACT | Status: DC | PRN

## 2021-11-22 MED ORDER — ISOSORBIDE MONONITRATE ER 30 MG PO TB24
30.0000 mg | ORAL_TABLET | Freq: Every day | ORAL | Status: DC
  Administered 2021-11-22 – 2021-11-25 (×4): 30 mg via ORAL
  Filled 2021-11-22 (×4): qty 1

## 2021-11-22 MED ORDER — ZOLPIDEM TARTRATE 5 MG PO TABS: 5.0000 mg | ORAL_TABLET | Freq: Every evening | ORAL | Status: DC | PRN

## 2021-11-22 MED ORDER — HYDRALAZINE HCL 50 MG PO TABS
50.0000 mg | ORAL_TABLET | Freq: Two times a day (BID) | ORAL | Status: DC
  Administered 2021-11-23 – 2021-11-25 (×5): 50 mg via ORAL
  Filled 2021-11-22 (×7): qty 1

## 2021-11-22 MED ORDER — SODIUM CHLORIDE 0.9 % IV SOLN: 250.0000 mL | INTRAVENOUS | Status: DC | PRN

## 2021-11-22 MED ORDER — POLYETHYLENE GLYCOL 3350 17 G PO PACK: 34.0000 g | PACK | Freq: Every day | ORAL | Status: DC | PRN

## 2021-11-22 MED ORDER — ALBUTEROL SULFATE HFA 108 (90 BASE) MCG/ACT IN AERS
2.0000 | INHALATION_SPRAY | Freq: Four times a day (QID) | RESPIRATORY_TRACT | Status: DC
  Filled 2021-11-22: qty 6.7

## 2021-11-22 MED ORDER — ALBUTEROL SULFATE (2.5 MG/3ML) 0.083% IN NEBU
3.0000 mL | INHALATION_SOLUTION | Freq: Four times a day (QID) | RESPIRATORY_TRACT | Status: DC
  Administered 2021-11-22: 3 mL via RESPIRATORY_TRACT
  Filled 2021-11-22: qty 3

## 2021-11-22 MED ORDER — SODIUM CHLORIDE 0.9% FLUSH
3.0000 mL | Freq: Two times a day (BID) | INTRAVENOUS | Status: DC
  Administered 2021-11-23 – 2021-11-25 (×4): 3 mL via INTRAVENOUS

## 2021-11-22 MED ORDER — PANTOPRAZOLE SODIUM 40 MG PO TBEC
40.0000 mg | DELAYED_RELEASE_TABLET | Freq: Every day | ORAL | Status: DC
  Administered 2021-11-22 – 2021-11-25 (×4): 40 mg via ORAL
  Filled 2021-11-22 (×4): qty 1

## 2021-11-22 MED ORDER — SORBITOL 70 % SOLN: 30.0000 mL | Status: DC | PRN

## 2021-11-22 MED ORDER — ADULT MULTIVITAMIN W/MINERALS CH
1.0000 | ORAL_TABLET | Freq: Every day | ORAL | Status: DC
  Administered 2021-11-22 – 2021-11-25 (×4): 1 via ORAL
  Filled 2021-11-22 (×4): qty 1

## 2021-11-22 MED ORDER — DOCUSATE SODIUM 283 MG RE ENEM: 1.0000 | ENEMA | RECTAL | Status: DC | PRN

## 2021-11-22 MED ORDER — SODIUM CHLORIDE 0.9% FLUSH: 3.0000 mL | INTRAVENOUS | Status: DC | PRN

## 2021-11-22 MED ORDER — BROMOCRIPTINE MESYLATE 2.5 MG PO TABS
1.2500 mg | ORAL_TABLET | Freq: Every day | ORAL | Status: DC
  Administered 2021-11-23 – 2021-11-25 (×3): 1.25 mg via ORAL
  Filled 2021-11-22 (×5): qty 1

## 2021-11-22 MED ORDER — AMLODIPINE BESYLATE 10 MG PO TABS
10.0000 mg | ORAL_TABLET | Freq: Every day | ORAL | Status: DC
  Administered 2021-11-22 – 2021-11-25 (×4): 10 mg via ORAL
  Filled 2021-11-22: qty 1
  Filled 2021-11-22: qty 2
  Filled 2021-11-22 (×2): qty 1

## 2021-11-22 MED ORDER — NEPRO/CARBSTEADY PO LIQD: 237.0000 mL | Freq: Three times a day (TID) | ORAL | Status: DC | PRN

## 2021-11-22 MED ORDER — METOPROLOL SUCCINATE ER 50 MG PO TB24
100.0000 mg | ORAL_TABLET | Freq: Every day | ORAL | Status: DC
  Administered 2021-11-23 – 2021-11-25 (×3): 100 mg via ORAL
  Filled 2021-11-22 (×3): qty 2

## 2021-11-22 MED ORDER — HYDRALAZINE HCL 20 MG/ML IJ SOLN: 5.0000 mg | INTRAMUSCULAR | Status: DC | PRN

## 2021-11-22 MED ORDER — ACETAMINOPHEN 650 MG RE SUPP: 650.0000 mg | Freq: Four times a day (QID) | RECTAL | Status: DC | PRN

## 2021-11-22 MED ORDER — SODIUM CHLORIDE 0.9% FLUSH
3.0000 mL | Freq: Two times a day (BID) | INTRAVENOUS | Status: DC
  Administered 2021-11-22: 3 mL via INTRAVENOUS

## 2021-11-22 MED ORDER — HEPARIN SODIUM (PORCINE) 5000 UNIT/ML IJ SOLN
5000.0000 [IU] | Freq: Three times a day (TID) | INTRAMUSCULAR | Status: DC
  Administered 2021-11-22 – 2021-11-25 (×6): 5000 [IU] via SUBCUTANEOUS
  Filled 2021-11-22 (×8): qty 1

## 2021-11-22 MED ORDER — ATORVASTATIN CALCIUM 80 MG PO TABS
80.0000 mg | ORAL_TABLET | Freq: Every day | ORAL | Status: DC
  Administered 2021-11-23 – 2021-11-25 (×3): 80 mg via ORAL
  Filled 2021-11-22 (×3): qty 1

## 2021-11-22 MED ORDER — NICOTINE 14 MG/24HR TD PT24: 14.0000 mg | MEDICATED_PATCH | Freq: Every day | TRANSDERMAL | Status: DC | PRN

## 2021-11-22 MED ORDER — INSULIN ASPART 100 UNIT/ML IJ SOLN
0.0000 [IU] | Freq: Three times a day (TID) | INTRAMUSCULAR | Status: DC
  Administered 2021-11-23: 1 [IU] via SUBCUTANEOUS

## 2021-11-22 NOTE — H&P (Signed)
History and Physical    Patient: [name] CHE:[id number] DOB: 25-Mar-1950 DOA: 11/22/2021 DOS: the patient was seen and examined on 11/22/2021 PCP: Erby Pian, PA-C  Patient coming from: Home - lives alone; NOK: [name], [phone]   Chief Complaint: COVID, hypoglycemia  HPI: [name] is a 71 y.o. female with medical history significant of chronic back pain; DM; HLD; HTN; ESRD on TTS HD; and thyroid disease presenting with hypoglycemia, COVID.  The patient reports going to HD last Tuesday and Thursday without difficulty.  Friday, she awoke feeling nauseated, anorexic, had diarrhea with attempts to eat.  She is feeling weak.  Also with some cough, periodic SOB.  Her niece came over on Saturday and did a COVID test, which was positive.  Symptoms continued through the weekend and today she was confused.  Her son called EMS and her glucose was in the 40s so she was brought to the ER.  She started smoking some again in the last few months.  However, she recently received a letter saying she was being placed on the transplant list and so she wants to stop.    ER Course:  Hypoglycemia, on Jardiance and Prandin.  Got COVID, diarrhea, poor PO intake, taking meds anyway.  Glucose 40s -> D10, ate -> glucose 63.  Starting D10, eating, obs overnight.       Review of Systems: As mentioned in the history of present illness. All other systems reviewed and are negative. Past Medical History:  Diagnosis Date   Arthritis    Chronic back pain    Constipation    Cough    Diabetes mellitus, type 2 ([place])    Diverticulosis    Fibroid    patient thinks this was the reason for her hysterectomy   GERD (gastroesophageal reflux disease)    Heart murmur    History of sebaceous cyst    Hyperlipemia    Hyperplastic colon polyp    Hypertension    Hyperthyroidism    Lumbar radiculopathy    Shortness of breath 09/13/2013   Stroke ([place]) 2015?   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   Past Surgical History:  Procedure Laterality Date   ABDOMINAL HYSTERECTOMY     --?ovaries remain   AV FISTULA PLACEMENT Left 07/16/2020   Procedure: LEFT BRACHIOCEPHALIC ARTERIOVENOUS (AV) FISTULA CREATION;  Surgeon: Cherre Robins, MD;  Location: [facility];  Service: Vascular;  Laterality: Left;  PERIPHERAL NERVE BLOCK   COLONOSCOPY W/ BIOPSIES  09/11/2008   ESOPHAGOGASTRODUODENOSCOPY (EGD) WITH PROPOFOL N/A 05/20/2020   Procedure: ESOPHAGOGASTRODUODENOSCOPY (EGD) WITH PROPOFOL;  Surgeon: Irene Shipper, MD;  Location: [facility];  Service: Endoscopy;  Laterality: N/A;   GIVENS CAPSULE STUDY N/A 06/28/2020   Procedure: GIVENS CAPSULE STUDY;  Surgeon: Irene Shipper, MD;  Location: [facility];  Service: Endoscopy;  Laterality: N/A;   RETINOPATHY SURGERY Bilateral 2013   Dr. Zigmund Daniel   Social History:  reports that she has been smoking cigarettes. She has a 17.50 pack-year smoking history. She has never used smokeless tobacco. She reports that she does not drink alcohol and does not use drugs.  Allergies  Allergen Reactions   Tape Other (See Comments)    Paper tape rips skin.    Semaglutide Rash    Rybelsus 3 mg    Family History  Problem Relation Age of Onset   Hypertension Mother  Sleep apnea Mother    Diabetes Mother    Hyperlipidemia Mother    Lung cancer Father        smoker   Hypertension Sister    Diabetes Sister    Hypertension Brother    Hypertension Sister    Diabetes Brother    Hypertension Brother    Breast cancer Neg Hx    Colon cancer Neg Hx    Esophageal cancer Neg Hx    Pancreatic cancer Neg Hx    Liver disease Neg Hx    Colon polyps Neg Hx     Prior to Admission medications   Medication Sig Start Date End Date Taking? Authorizing Provider  acetaminophen (TYLENOL) 500 MG tablet Take 1,000 mg by mouth every 6 (six) hours as needed for moderate pain.   Yes [provider]   allopurinol (ZYLOPRIM) 100 MG tablet Take 1 tablet (100 mg total) by mouth daily. 05/23/20  Yes Ghimire, Henreitta Leber, MD  amLODipine (NORVASC) 10 MG tablet TAKE 1 TABLET BY MOUTH EVERY DAY Patient taking differently: Take 10 mg by mouth daily. 03/16/20  Yes Marrian Salvage, FNP  atorvastatin (LIPITOR) 80 MG tablet TAKE 1 TABLET BY MOUTH EVERY DAY Patient taking differently: Take 80 mg by mouth daily. 03/30/20  Yes Marrian Salvage, FNP  B Complex-C-Zn-Folic Acid (DIALYVITE 427-CWCB 15) 0.8 MG TABS Take 1 tablet by mouth daily. 01/28/21  Yes [provider]  bromocriptine (PARLODEL) 2.5 MG tablet Take 0.5 tablets (1.25 mg total) by mouth daily. 02/24/20  Yes Renato Shin, MD  Calcium Acetate 667 MG TABS Take 1 tablet by mouth daily.   Yes [provider]  epoetin alfa-epbx (RETACRIT) 76283 UNIT/ML injection Inject 40,000 Units into the skin every 28 (twenty-eight) days.   Yes [provider]  hydrALAZINE (APRESOLINE) 50 MG tablet Take 1 tablet (50 mg total) by mouth every 8 (eight) hours. Patient taking differently: Take 50 mg by mouth 2 (two) times daily. 06/10/20 11/22/21 Yes Dessa Phi, DO  isosorbide mononitrate (IMDUR) 30 MG 24 hr tablet Take 1 tablet (30 mg total) by mouth daily. 06/11/20  Yes Dessa Phi, DO  lidocaine-prilocaine (EMLA) cream Apply 1 Application topically See admin instructions. Three times a week 02/05/21  Yes [provider]  metoprolol succinate (TOPROL-XL) 100 MG 24 hr tablet Take 100 mg by mouth daily.   Yes [provider]  oxyCODONE-acetaminophen (PERCOCET) 5-325 MG tablet Take 1 tablet by mouth every 6 (six) hours as needed for severe pain. 07/16/20  Yes Rhyne, Hulen Shouts, PA-C  pantoprazole (PROTONIX) 40 MG tablet Take 1 tablet (40 mg total) by mouth daily. 06/12/19  Yes Marrian Salvage, FNP  polyethylene glycol (MIRALAX / GLYCOLAX) 17 g packet Take 34-51 g by mouth daily as needed for mild constipation.    Yes [provider]  repaglinide (PRANDIN) 2 MG tablet Take 1 tablet (2 mg total) by mouth 2 (two) times daily before a meal. 10/29/21  Yes Elayne Snare, MD  sitaGLIPtin (JANUVIA) 25 MG tablet Take 1 tablet (25 mg total) by mouth daily. 06/23/21  Yes Elayne Snare, MD  Vitamin D, Ergocalciferol, (DRISDOL) 1.25 MG (50000 UNIT) CAPS capsule Take 50,000 Units by mouth once a week. 02/18/21  Yes [provider]  Blood Glucose Monitoring Suppl ([facility]) w/Device KIT 1 each by Does not apply route 2 (two) times daily. E11.9    [provider]  glucose blood (ONETOUCH VERIO) test strip USE AS DIRECTED TWICE DAILY  12/12/19   Marrian Salvage, FNP  Methoxy PEG-Epoetin Beta (MIRCERA IJ) Inject 1 Syringe into the muscle See admin instructions. Given at dialysis 01/14/21 03/01/22  [provider]  OneTouch Delica Lancets 76E MISC 1 each by Does not apply route 2 (two) times daily. E11.9    [provider]    Physical Exam: Vitals:   11/22/21 1545 11/22/21 1745 11/22/21 1830 11/22/21 1834  BP: (!) 152/53 (!) 161/60 (!) 164/52 (!) 164/52  Pulse: (!) 59 (!) 58 (!) 59   Resp: 14 13 (!) 22   Temp:      TempSrc:      SpO2: 100% 99% 99%   Weight:      Height:       General:  Appears calm and comfortable and is in NAD Eyes:  EOMI, normal lids, iris ENT:  grossly normal hearing, lips & tongue, mmm Neck:  no LAD, masses or thyromegaly Cardiovascular:  RR with mild cradycardia, no m/r/g. No LE edema.  Respiratory:   CTA bilaterally with no wheezes/rales/rhonchi.  Normal respiratory effort. Abdomen:  soft, NT, ND Skin:  no rash or induration seen on limited exam Musculoskeletal:  grossly normal tone BUE/BLE, good ROM, no bony abnormality Psychiatric:  blunted mood and affect, speech fluent and appropriate, AOx3 Neurologic:  CN 2-12 grossly intact, moves all extremities in coordinated fashion   Radiological Exams on Admission: Independently  reviewed - see discussion in A/P where applicable  DG Chest 2 View  Result Date: 11/22/2021 CLINICAL DATA:  Cough, fever. EXAM: CHEST - 2 VIEW COMPARISON:  June 30, 2020. FINDINGS: Stable cardiomegaly. Minimal right midlung subsegmental atelectasis is noted. Probable peribronchial thickening is noted in left lower lobe. The visualized skeletal structures are unremarkable. IMPRESSION: Probable peribronchial thickening seen in left lower lobe concerning for bronchitis. This was present to some degree on the prior exam suggesting possible chronic bronchitis. Electronically Signed   By: Marijo Conception M.D.   On: 11/22/2021 12:59    EKG: Independently reviewed.  NSR with rate 55; LAFB; nonspecific ST changes with no evidence of acute ischemia   Labs on Admission: I have personally reviewed the available labs and imaging studies at the time of the admission.  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   ESRD on hemodialysis ([place])   COVID-19 virus infection    Hypoglycemia with h/o DM -Patient with h/o DM presenting with hypoglycemia into the 40s, recurrent to the 60s in the ER despite treatment and food -She has recently had COVID infection with diarrhea and poor PO intake but has continued taking her PO medications -Will observe overnight on telemetry -CBG q 4h -Continue D10 but decrease rate to 50 cc/hr to avoid volume overload in HD patient -Can wean off D10 overnight as appropriate -SSI insulin once no longer needing D10 -Hold Prandin and Januvia for now -DM  coordinator consulted -Her A1c in 05/2021 was 6  COVID -Patient with presenting with generally mild COVID symptoms including SOB, cough, diarrhea, and anorexia -She does not have an O2 requirement  -COVID  POSITIVE -Encourage mobilization/ambulation as much as possible -Patient was seen wearing full PPE including: gown, gloves, N95; donning and doffing was in compliance with current standards.  ESRD on HD -Patient on chronic TTS HD -Nephrology prn order set utilized -She does not appear to be volume overloaded or otherwise in need of acute HD -Nephrology was notified that patient will need HD either inpatient or outpatient tomorrow -Continue Dialayvite, Phoslo -Continue bromocriptine  HTN -Continue amlodipine, hydralazine, Imdur, Toprol XL  HLD -Continue Lipitor  Gout -Continue Allopurinol  Tobacco dependence -Generally needs 6 months of abstinence prior to transplant -Patient is aware, continuing to try to quit -Nicotine patch provided    Advance Care Planning:   Code Status: Full Code   Consults: Nephrology; DM coordinator  DVT Prophylaxis: Heparin  Family Communication: None present; she declined to have me call family at the time of admission  Severity of Illness: The appropriate patient status for this patient is OBSERVATION. Observation status is judged to be reasonable and necessary in order to provide the required intensity of service to ensure the patient's safety. The patient's presenting symptoms, physical exam findings, and initial radiographic and laboratory data in the context of their medical condition is felt to place them at decreased risk for further clinical deterioration. Furthermore, it is anticipated that the patient will be medically stable for discharge from the hospital within 2 midnights of admission.   Author: Karmen Bongo, MD 11/22/2021 6:56 PM  For on call review [email or web address].

## 2021-11-22 NOTE — ED Provider Notes (Signed)
[place] EMERGENCY DEPARTMENT Provider Note   CSN: [id number] Arrival date & time: 11/22/21  1112     History  Chief Complaint  Patient presents with   Hypoglycemia    [name] is a 71 y.o. female with  ESRD on HD TuThSat, T2DM on oral meds, PVD, diastolic CHF, history of lacunar infarct, hyperthyroidism,, GI bleed, GERD, bilateral onychomycosis of toenails, presents with hypoglycemia, generalized weakness.   Patient's son called 17 today because she was slightly confused and not talking correctly. EMS found her BG to be in the 40s. Gave her 250 cc D10 and it came up to 180s. Last dialysis was Thursday 11/18/21. She missed Saturday because she has been feeling ill with generalized weakness, loose stools, intermittent cough, anorexia. She took a covid test yesterday and it was positive.  Patient takes Januvia and Prandin but no insulin. She denies f/c, CP, SOB, abd pain, N/V, urinary sxs, leg swelling. Patient is active on the kidney transplant wait list followed by [facility].  HPI     Home Medications Prior to Admission medications   Medication Sig Start Date End Date Taking? Authorizing Provider  acetaminophen (TYLENOL) 500 MG tablet Take 1,000 mg by mouth every 6 (six) hours as needed for moderate pain.   Yes [provider]  allopurinol (ZYLOPRIM) 100 MG tablet Take 1 tablet (100 mg total) by mouth daily. 05/23/20  Yes Ghimire, Henreitta Leber, MD  amLODipine (NORVASC) 10 MG tablet TAKE 1 TABLET BY MOUTH EVERY DAY Patient taking differently: Take 10 mg by mouth daily. 03/16/20  Yes Marrian Salvage, FNP  atorvastatin (LIPITOR) 80 MG tablet TAKE 1 TABLET BY MOUTH EVERY DAY Patient taking differently: Take 80 mg by mouth daily. 03/30/20  Yes Marrian Salvage, FNP  B Complex-C-Zn-Folic Acid (DIALYVITE 559-RCBU 15) 0.8 MG TABS Take 1 tablet by mouth daily. 01/28/21  Yes [provider]  bromocriptine  (PARLODEL) 2.5 MG tablet Take 0.5 tablets (1.25 mg total) by mouth daily. 02/24/20  Yes Renato Shin, MD  Calcium Acetate 667 MG TABS Take 1 tablet by mouth daily.   Yes [provider]  epoetin alfa-epbx (RETACRIT) 38453 UNIT/ML injection Inject 40,000 Units into the skin every 28 (twenty-eight) days.   Yes [provider]  hydrALAZINE (APRESOLINE) 50 MG tablet Take 1 tablet (50 mg total) by mouth every 8 (eight) hours. Patient taking differently: Take 50 mg by mouth 2 (two) times daily. 06/10/20 11/22/21 Yes Dessa Phi, DO  isosorbide mononitrate (IMDUR) 30 MG 24 hr tablet Take 1 tablet (30 mg total) by mouth daily. 06/11/20  Yes Dessa Phi, DO  lidocaine-prilocaine (EMLA) cream Apply 1 Application topically See admin instructions. Three times a week 02/05/21  Yes [provider]  metoprolol succinate (TOPROL-XL) 100 MG 24 hr tablet Take 100 mg by mouth daily.   Yes [provider]  oxyCODONE-acetaminophen (PERCOCET) 5-325 MG tablet Take 1 tablet by mouth every 6 (six) hours as needed for severe pain. 07/16/20  Yes Rhyne, Hulen Shouts, PA-C  pantoprazole (PROTONIX) 40 MG tablet Take 1 tablet (40 mg total) by mouth daily. 06/12/19  Yes Marrian Salvage, FNP  polyethylene glycol (MIRALAX / GLYCOLAX) 17 g packet Take 34-51 g by mouth daily as needed for mild constipation.   Yes [provider]  repaglinide (PRANDIN) 2 MG tablet Take 1 tablet (2 mg total) by mouth 2 (two) times daily before a meal. 10/29/21  Yes Elayne Snare, MD  sitaGLIPtin (  JANUVIA) 25 MG tablet Take 1 tablet (25 mg total) by mouth daily. 06/23/21  Yes Elayne Snare, MD  Vitamin D, Ergocalciferol, (DRISDOL) 1.25 MG (50000 UNIT) CAPS capsule Take 50,000 Units by mouth once a week. 02/18/21  Yes [provider]  Blood Glucose Monitoring Suppl ([place]) w/Device KIT 1 each by Does not apply route 2 (two) times daily. E11.9    [provider]  glucose blood  (ONETOUCH VERIO) test strip USE AS DIRECTED TWICE DAILY 12/12/19   Marrian Salvage, FNP  Methoxy PEG-Epoetin Beta (MIRCERA IJ) Inject 1 Syringe into the muscle See admin instructions. Given at dialysis 01/14/21 03/01/22  [provider]  OneTouch Delica Lancets 53Z MISC 1 each by Does not apply route 2 (two) times daily. E11.9    [provider]      Allergies    Tape and Semaglutide    Review of Systems   Review of Systems Review of systems negative for f/c.  A 10 point review of systems was performed and is negative unless otherwise reported in HPI.  Physical Exam Updated Vital Signs BP (!) 152/53   Pulse (!) 59   Temp 97.6 F (36.4 C) (Oral)   Resp 14   Ht _0  (1.575 m)   Wt 56.7 kg   SpO2 100%   BMI 22.86 kg/m  Physical Exam General: Normal appearing female, lying in bed.  HEENT: PERRLA, Sclera anicteric, MMM, trachea midline. Cardiology: RRR, no murmurs/rubs/gallops Resp: Normal respiratory rate and effort. CTAB, no wheezes, rhonchi, crackles.  Abd: Soft, non-tender, non-distended. No rebound tenderness or guarding.  GU: Deferred. MSK: No peripheral edema or signs of trauma. Extremities without deformity or TTP. No cyanosis or clubbing. Skin: warm, dry. No rashes or lesions. Back: No CVA tenderness Neuro: A&Ox4, CNs II-XII grossly intact. MAEs. Sensation grossly intact.  Psych: Normal mood and affect.   ED Results / Procedures / Treatments   Labs (all labs ordered are listed, but only abnormal results are displayed) Labs Reviewed  SARS CORONAVIRUS 2 BY RT PCR - Abnormal; Notable for the following components:      Result Value   SARS Coronavirus 2 by RT PCR POSITIVE (*)    All other components within normal limits  CBC WITH DIFFERENTIAL/PLATELET - Abnormal; Notable for the following components:   RBC 3.07 (*)    Hemoglobin 10.6 (*)    HCT 31.1 (*)    MCV 101.3 (*)    MCH 34.5 (*)    All other components within normal limits   COMPREHENSIVE METABOLIC PANEL - Abnormal; Notable for the following components:   Sodium 128 (*)    Chloride 93 (*)    BUN 50 (*)    Creatinine, Ser 6.85 (*)    Albumin 3.3 (*)    AST 50 (*)    GFR, Estimated 6 (*)    All other components within normal limits  CBG MONITORING, ED - Abnormal; Notable for the following components:   Glucose-Capillary 126 (*)    All other components within normal limits  CBG MONITORING, ED - Abnormal; Notable for the following components:   Glucose-Capillary 63 (*)    All other components within normal limits  BASIC METABOLIC PANEL  CBG MONITORING, ED  CBG MONITORING, ED  CBG MONITORING, ED  CBG MONITORING, ED    EKG EKG Interpretation  Date/Time:  Monday November 22 2021 11:53:59 EDT Ventricular Rate:  55 PR Interval:  188 QRS Duration: 95 QT Interval:  508 QTC Calculation: 486 R Axis:   -40 Text Interpretation: Sinus rhythm Probable left atrial enlargement Left anterior fascicular block Abnormal R-wave progression, late transition Left ventricular hypertrophy ST elevation suggests acute pericarditis Borderline prolonged QT interval Confirmed by Cindee Lame [phone]) on 11/22/2021 4:19:02 PM  Radiology DG Chest 2 View  Result Date: 11/22/2021 CLINICAL DATA:  Cough, fever. EXAM: CHEST - 2 VIEW COMPARISON:  June 30, 2020. FINDINGS: Stable cardiomegaly. Minimal right midlung subsegmental atelectasis is noted. Probable peribronchial thickening is noted in left lower lobe. The visualized skeletal structures are unremarkable. IMPRESSION: Probable peribronchial thickening seen in left lower lobe concerning for bronchitis. This was present to some degree on the prior exam suggesting possible chronic bronchitis. Electronically Signed   By: Marijo Conception M.D.   On: 11/22/2021 12:59    Procedures .Critical Care  Performed by: Audley Hose, MD Authorized by: Audley Hose, MD   Critical care provider statement:    Critical care time (minutes):   50   Critical care was necessary to treat or prevent imminent or life-threatening deterioration of the following conditions:  Endocrine crisis   Critical care was time spent personally by me on the following activities:  Development of treatment plan with patient or surrogate, discussions with consultants, evaluation of patient's response to treatment, examination of patient, ordering and review of laboratory studies, ordering and review of radiographic studies, ordering and performing treatments and interventions, re-evaluation of patient's condition, review of old charts and blood draw for specimens   Care discussed with: admitting provider       Medications Ordered in ED Medications  sodium chloride flush (NS) 0.9 % injection 3 mL (3 mLs Intravenous Given 11/22/21 1145)  sodium chloride flush (NS) 0.9 % injection 3 mL (has no administration in time range)  0.9 %  sodium chloride infusion (has no administration in time range)  ondansetron (ZOFRAN) injection 4 mg (has no administration in time range)  dextrose 10 % infusion ( Intravenous New Bag/Given 11/22/21 1618)  sodium chloride 0.9 % bolus 250 mL (0 mLs Intravenous Stopped 11/22/21 1425)    ED Course/ Medical Decision Making/ A&P                          Medical Decision Making Amount and/or Complexity of Data Reviewed Labs: ordered. Decision-making details documented in ED Course. Radiology: ordered. Decision-making details documented in ED Course.  Risk Prescription drug management. Decision regarding hospitalization.   Patient is afebrile, HDS. Intermittently coughing but no resp distress or increased WOB.   Patient's BG likely low in s/o anorexia and poor PO intake d/t covid-19 and oral antihyperglycemics for her T2DM. Patient missed dialysis and w/ poor PO intake consider electrolyte abnormalities. Will need to continue to recheck her BG every hour for the next several hours as her oral meds will last awhile. Patient is  able to take PO. Will also evaluate w/ CXR for source of cough; likely COVID but consider COVID pneumonia, pulm edema, pleural effusion, superimposed bacterial pneumonia.  For further updates please see ED course.   I have personally reviewed and interpreted all labs and imaging.   Clinical Course as of 11/22/21 1621  Mon Nov 22, 2021  1308 Glucose-Capillary(!): 126 [HN]  1308 SARS Coronavirus 2 by RT PCR(!): POSITIVE [HN]  1310 DG Chest 2 View Probable peribronchial thickening seen in left lower lobe concerning for bronchitis. This was present to some degree on the prior exam  suggesting possible chronic bronchitis. [HN]  1310 Sodium(!): 128 New hyponatremia, giving small bolus 250 cc NS [HN]  1418 Glucose-Capillary: 86 [HN]  1620 Glucose-Capillary(!): 63 Even after eating lunch tray, patient's BG drops to 63 mg/dL. Will start D10 gtt and admit. Patient is signed out to the oncoming ED physician who is made aware of her history, presentation, exam, workup, and plan.  Plan is to admit to medicine for hypoglycemia in s/o covid-19. [HN]    Clinical Course User Index [HN] Audley Hose, MD          Final Clinical Impression(s) / ED Diagnoses Final diagnoses:  Hypoglycemia  COVID-19  Hyponatremia    Rx / DC Orders ED Discharge Orders     None        This note was created using dictation software, which may contain spelling or grammatical errors.    Audley Hose, MD 12/05/21 [phone]

## 2021-11-22 NOTE — ED Notes (Signed)
Pt given orange juice, crackers & peanut butter. Regenia Skeeter MD aware of cbg of 62.

## 2021-11-22 NOTE — ED Notes (Signed)
RN provided family face masks

## 2021-11-22 NOTE — ED Notes (Signed)
[phone] Legrand Como (son)

## 2021-11-22 NOTE — ED Provider Notes (Signed)
Care transferred to me.  Original plan was to discharge after most recent glucose and repeat BMP.  However unfortunately her glucose has gone back down and is at 63 despite eating a lunch tray including a chocolate pudding.  I think this is the result of her oral diabetic meds and poor p.o. intake due to COVID.  We discussed she should be admitted overnight and let her medicine clear.  She is due for dialysis tomorrow. Dr. Lorin Mercy will admit.   Sherwood Gambler, MD 11/22/21 [phone]

## 2021-11-22 NOTE — Plan of Care (Signed)
  Problem: Education: Goal: Knowledge of risk factors and measures for prevention of condition will improve Outcome: Progressing   Problem: Coping: Goal: Psychosocial and spiritual needs will be supported Outcome: Progressing   Problem: Respiratory: Goal: Will maintain a patent airway Outcome: Progressing Goal: Complications related to the disease process, condition or treatment will be avoided or minimized Outcome: Progressing   Problem: Education: Goal: Knowledge of General Education information will improve Description: Including pain rating scale, medication(s)/side effects and non-pharmacologic comfort measures Outcome: Progressing   Problem: Health Behavior/Discharge Planning: Goal: Ability to manage health-related needs will improve Outcome: Progressing   Problem: Clinical Measurements: Goal: Ability to maintain clinical measurements within normal limits will improve Outcome: Progressing Goal: Will remain free from infection Outcome: Progressing Goal: Diagnostic test results will improve Outcome: Progressing Goal: Respiratory complications will improve Outcome: Progressing Goal: Cardiovascular complication will be avoided Outcome: Progressing   Problem: Nutrition: Goal: Adequate nutrition will be maintained Outcome: Progressing   Problem: Activity: Goal: Risk for activity intolerance will decrease Outcome: Progressing   Problem: Coping: Goal: Level of anxiety will decrease Outcome: Progressing   Problem: Elimination: Goal: Will not experience complications related to bowel motility Outcome: Progressing Goal: Will not experience complications related to urinary retention Outcome: Progressing

## 2021-11-22 NOTE — ED Triage Notes (Signed)
Pt bib ems from home c/o hypoglycemia, CBG 41, and COVID +. Pt received dextrose 10 en route CBG 182.  Pt miss HD Saturday. Normally goes T,TH,Sat. Pt states she has been feeling sick and having diarrhea.   BP 146/56 HR 56 RA 96%

## 2021-11-22 NOTE — ED Notes (Signed)
Pt eating house tray

## 2021-11-22 NOTE — ED Notes (Signed)
Pt ambulated independently to bathroom.

## 2021-11-23 DIAGNOSIS — N186 End stage renal disease: Secondary | ICD-10-CM | POA: Diagnosis not present

## 2021-11-23 DIAGNOSIS — M1A3620 Chronic gout due to renal impairment, left knee, without tophus (tophi): Secondary | ICD-10-CM

## 2021-11-23 DIAGNOSIS — U071 COVID-19: Secondary | ICD-10-CM

## 2021-11-23 DIAGNOSIS — E785 Hyperlipidemia, unspecified: Secondary | ICD-10-CM

## 2021-11-23 DIAGNOSIS — E162 Hypoglycemia, unspecified: Secondary | ICD-10-CM | POA: Diagnosis not present

## 2021-11-23 DIAGNOSIS — E113599 Type 2 diabetes mellitus with proliferative diabetic retinopathy without macular edema, unspecified eye: Secondary | ICD-10-CM

## 2021-11-23 DIAGNOSIS — Z992 Dependence on renal dialysis: Secondary | ICD-10-CM

## 2021-11-23 LAB — CBC
HCT: 24.2 % — ABNORMAL LOW (ref 36.0–46.0)
Hemoglobin: 8.5 g/dL — ABNORMAL LOW (ref 12.0–15.0)
MCH: 34.8 pg — ABNORMAL HIGH (ref 26.0–34.0)
MCHC: 35.1 g/dL (ref 30.0–36.0)
MCV: 99.2 fL (ref 80.0–100.0)
Platelets: 153 10*3/uL (ref 150–400)
RBC: 2.44 MIL/uL — ABNORMAL LOW (ref 3.87–5.11)
RDW: 13.7 % (ref 11.5–15.5)
WBC: 5.7 10*3/uL (ref 4.0–10.5)
nRBC: 0 % (ref 0.0–0.2)

## 2021-11-23 LAB — GLUCOSE, CAPILLARY
Glucose-Capillary: 104 mg/dL — ABNORMAL HIGH (ref 70–99)
Glucose-Capillary: 113 mg/dL — ABNORMAL HIGH (ref 70–99)
Glucose-Capillary: 131 mg/dL — ABNORMAL HIGH (ref 70–99)
Glucose-Capillary: 168 mg/dL — ABNORMAL HIGH (ref 70–99)
Glucose-Capillary: 78 mg/dL (ref 70–99)
Glucose-Capillary: 83 mg/dL (ref 70–99)

## 2021-11-23 LAB — BASIC METABOLIC PANEL
Anion gap: 12 (ref 5–15)
BUN: 50 mg/dL — ABNORMAL HIGH (ref 8–23)
CO2: 22 mmol/L (ref 22–32)
Calcium: 8.6 mg/dL — ABNORMAL LOW (ref 8.9–10.3)
Chloride: 93 mmol/L — ABNORMAL LOW (ref 98–111)
Creatinine, Ser: 7 mg/dL — ABNORMAL HIGH (ref 0.44–1.00)
GFR, Estimated: 6 mL/min — ABNORMAL LOW (ref >60)
Glucose, Bld: 101 mg/dL — ABNORMAL HIGH (ref 70–99)
Potassium: 3.8 mmol/L (ref 3.5–5.1)
Sodium: 127 mmol/L — ABNORMAL LOW (ref 135–145)

## 2021-11-23 LAB — HEMOGLOBIN A1C
Hgb A1c MFr Bld: 5.2 % (ref 4.8–5.6)
Mean Plasma Glucose: 102.54 mg/dL

## 2021-11-23 LAB — HEPATITIS B SURFACE ANTIGEN: Hepatitis B Surface Ag: NONREACTIVE

## 2021-11-23 MED ORDER — HEPARIN SODIUM (PORCINE) 1000 UNIT/ML DIALYSIS
2500.0000 [IU] | INTRAMUSCULAR | Status: DC | PRN
  Administered 2021-11-23: 2500 [IU] via INTRAVENOUS_CENTRAL
  Filled 2021-11-23 (×3): qty 3

## 2021-11-23 MED ORDER — DARBEPOETIN ALFA 60 MCG/0.3ML IJ SOSY
60.0000 ug | PREFILLED_SYRINGE | INTRAMUSCULAR | Status: DC
  Filled 2021-11-23: qty 0.3

## 2021-11-23 MED ORDER — CHLORHEXIDINE GLUCONATE CLOTH 2 % EX PADS
6.0000 | MEDICATED_PAD | Freq: Every day | CUTANEOUS | Status: DC
  Administered 2021-11-23 – 2021-11-24 (×2): 6 via TOPICAL

## 2021-11-23 NOTE — Progress Notes (Signed)
[facility]  PROGRESS NOTE  SYRETTA KOCHEL JAS:[id number] DOB: 1951/01/08 DOA: 11/22/2021 PCP: Erby Pian, PA-C   Brief HPI:   71 year old female with medical history of chronic back pain, diabetes mellitus type 2, hyperlipidemia, hypertension, ESRD on hemodialysis TTS, thyroid disease presented with hypoglycemia, COVID-19 infection. Patient says that she went for dialysis Tuesday and Thursday without any difficulty, Friday morning she woke up feeling nauseous and anorexic also had diarrhea.  She was weak.  Her niece came over on Saturday and did COVID test which was positive. Patient was brought to the ED, she was found to be hypoglycemic with CBG in 40s. Patient is on Jardiance and Prandin at home. Admitted for diarrhea and hypoglycemia.  Started on D10W    Subjective   Patient seen, denies any complaints.   Assessment/Plan:     Hypoglycemia -Patient has history of diabetes mellitus type 2, presented with hypoglycemia with CBG in 40s; likely in setting of poor p.o. intake along with taking her p.o. meds for diabetes -Started on D10W at 50 mill per hour -CBG still low less than 100 -Patient explanted and Januvia at home which is currently on hold -Hemoglobin A1c on 06/23/2021 was 6.0 -We will repeat hemoglobin A1c  COVID-19 infection -Patient presented with mild COVID-19 symptoms including cough, diarrhea anorexia -Symptom's have resolved; symptom started 4 days ago -Not requiring oxygen  ESRD on hemodialysis -Patient is on chronic hemodialysis TTS -Nephrology consulted  Hypertension -Continue amlodipine, hydralazine, Imdur, Toprol-XL  Hyperlipidemia -Continue Lipitor  Gout -Continue allopurinol  Medications     allopurinol  100 mg Oral Daily   amLODipine  10 mg Oral Daily   atorvastatin  80 mg Oral Daily   bromocriptine  1.25 mg Oral Daily   calcium acetate  667 mg Oral Daily   Chlorhexidine Gluconate Cloth  6 each Topical Q0600    darbepoetin (ARANESP) injection - DIALYSIS  60 mcg Subcutaneous Q Tue-1800   heparin  5,000 Units Subcutaneous Q8H   hydrALAZINE  50 mg Oral BID   insulin aspart  0-6 Units Subcutaneous TID WC   isosorbide mononitrate  30 mg Oral Daily   metoprolol succinate  100 mg Oral Daily   multivitamin with minerals  1 tablet Oral Daily   pantoprazole  40 mg Oral Daily   sodium chloride flush  3 mL Intravenous Q12H     Data Reviewed:   CBG:  Recent Labs  Lab 11/22/21 2209 11/23/21 0029 11/23/21 0323 11/23/21 0838 11/23/21 1116  GLUCAP 95 131* 104* 78 83    SpO2: 99 %    Vitals:   11/22/21 2130 11/22/21 2203 11/23/21 0841 11/23/21 1117  BP:  (!) 124/49 (!) 157/50 (!) 130/58  Pulse: 61 (!) 57 65 60  Resp: '18 16 17 18  '$ Temp:  98.5 F (36.9 C) 98.5 F (36.9 C) 98.6 F (37 C)  TempSrc:  Oral Oral Oral  SpO2: 99% 100% 100% 99%  Weight:      Height:          Data Reviewed:  Basic Metabolic Panel: Recent Labs  Lab 11/22/21 1200 11/22/21 1539 11/23/21 0347  NA 128* 127* 127*  K 3.7 3.6 3.8  CL 93* 93* 93*  CO2 '23 24 22  '$ GLUCOSE 97 78 101*  BUN 50* 50* 50*  CREATININE 6.85* 6.69* 7.00*  CALCIUM 9.0 8.7* 8.6*    CBC: Recent Labs  Lab 11/22/21 1200 11/23/21 0347  WBC 6.2 5.7  NEUTROABS 4.7  --  HGB 10.6* 8.5*  HCT 31.1* 24.2*  MCV 101.3* 99.2  PLT 180 153    LFT Recent Labs  Lab 11/22/21 1200  AST 50*  ALT 32  ALKPHOS 58  BILITOT 0.8  PROT 6.8  ALBUMIN 3.3*     Antibiotics: Anti-infectives (From admission, onward)    None        DVT prophylaxis: Heparin  Code Status: Full code  Family Communication: No family at bedside   CONSULTS    Objective    Physical Examination:   Not examined, patient in bathroom  Status is: Inpatient:             Oswald Hillock   [facility] If 7PM-7AM, please contact night-coverage at [email or web address], Office  [phone]   11/23/2021, 4:13 PM  LOS: 0 days

## 2021-11-23 NOTE — Plan of Care (Signed)

## 2021-11-23 NOTE — Inpatient Diabetes Management (Signed)
Inpatient Diabetes Program Recommendations  AACE/ADA: New Consensus Statement on Inpatient Glycemic Control  Target Ranges:  Prepandial:   less than 140 mg/dL      Peak postprandial:   less than 180 mg/dL (1-2 hours)      Critically ill patients:  140 - 180 mg/dL    Latest Reference Range & Units 11/23/21 00:29 11/23/21 03:23 11/23/21 08:38  Glucose-Capillary 70 - 99 mg/dL 131 (H) 104 (H) 78    Latest Reference Range & Units 11/22/21 11:31 11/22/21 12:58 11/22/21 14:14 11/22/21 15:18 11/22/21 16:08 11/22/21 17:26 11/22/21 22:09  Glucose-Capillary 70 - 99 mg/dL 92 126 (H) 86 94 63 (L) 119 (H) 95    Latest Reference Range & Units 06/23/21 13:14  Hemoglobin A1C 4.0 - 5.6 % 6.0 !   Review of Glycemic Control  Diabetes history: DM2 Outpatient Diabetes medications: Prandin 2 mg BID, Januvia 25 mg daily Current orders for Inpatient glycemic control: Novolog 0-6 units TID with meals  Inpatient Diabetes Program Recommendations:    HbgA1C: Please consider ordering an A1C to evaluate glycemic control over the past 2-3 months.  Outpatient: Please provide Rx for glucose monitoring kit (#[id number]) at discharge.  NOTE: Patient admitted with hypoglycemia and COVID. Spoke with patient over the phone to inquire about diabetes and home regimen for diabetes control. Patient reports being followed by Dr. Dwyane Dee for diabetes management and was last seen on 10/04/21. Patient confirms that she is taking Prandin 2 mg BID and Januvia 25 mg daily as an outpatient for diabetes control. Patient reports she has not been about to eat much over the past few days due to being sick but she has continued to take Prandin and Januvia. Discussed Prandin and how it works and increased risk of hypoglycemia especially if not eating.  Patient reports she has not checked glucose very often recently because she can not get her glucometer to work (will need Rx for glucose monitoring kit [id number]).  Inquired about hypoglycemia and  patient states that she had hypoglycemia this admission and she denies having hypoglycemia frequently as an outpatient. Patient reports that when glucose was 63 mg/dl at 16:08 on 11/22/21, she felt strange "felt like she could not get words out right and had blurry vision."  Discussed hypoglycemia, signs and symptoms, along with treatment. Encouraged patient to be sure she is eating when she takes Prandin and to check glucose at least 2-3 times per day and any time she may have symptoms of hypoglycemia. Patient reports she has an appointment coming up with Dr. Dwyane Dee next month. Encouraged patient to follow up with Dr. Dwyane Dee and to reach out to his office if she has any glucose values less than 70 mg/dl at home so she can be advised on what she may need to adjust with DM medications.  Patient verbalized understanding of information discussed and reports no further questions at this time related to diabetes.  Thanks, Barnie Alderman, RN, MSN, CDE Diabetes Coordinator Inpatient Diabetes Program [phone] (Team Pager)

## 2021-11-23 NOTE — Consult Note (Signed)
[facility] Renal Consultation Note    Indication for Consultation:  Management of ESRD/hemodialysis; anemia, hypertension/volume and secondary hyperparathyroidism   HPI: [name] is a 71 y.o. female with ESRD on HD, DM, HTN, HLD, prior CVA who is admitted observation status with hypoglycemia. Had positive COVID test prior to arrival. Presented with weakness, cough, diarrhea. On arrival found to have glucose in 40s. D10 infusion started. Intake remains poor. Nephrology consulted for routine dialysis. Chronic bronchitis on CXR.   Dialysis TTS at [facility]. Last dialysis on 10/26. Too weak to go dialysis Saturday. Compliant with treatments. No issues noted. Using AVF.   Seen in room. Comfortable on RA. Still not eating much -doesn't like the food. Diarrhea resolved. No fevers, chills, chest pain, nausea/vomiting.   Past Medical History:  Diagnosis Date   Arthritis    Chronic back pain    Constipation    Cough    Diabetes mellitus, type 2 ([place])    Diverticulosis    Fibroid    patient thinks this was the reason for her hysterectomy   GERD (gastroesophageal reflux disease)    Heart murmur    History of sebaceous cyst    Hyperlipemia    Hyperplastic colon polyp    Hypertension    Hyperthyroidism    Lumbar radiculopathy    Shortness of breath 09/13/2013   Stroke ([place]) 2015?   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   Past Surgical History:  Procedure Laterality Date   ABDOMINAL HYSTERECTOMY     --?ovaries remain   AV FISTULA PLACEMENT Left 07/16/2020   Procedure: LEFT BRACHIOCEPHALIC ARTERIOVENOUS (AV) FISTULA CREATION;  Surgeon: Cherre Robins, MD;  Location: [facility];  Service: Vascular;  Laterality: Left;  PERIPHERAL NERVE BLOCK   COLONOSCOPY W/ BIOPSIES  09/11/2008   ESOPHAGOGASTRODUODENOSCOPY (EGD) WITH PROPOFOL N/A 05/20/2020   Procedure: ESOPHAGOGASTRODUODENOSCOPY (EGD) WITH PROPOFOL;   Surgeon: Irene Shipper, MD;  Location: [facility];  Service: Endoscopy;  Laterality: N/A;   GIVENS CAPSULE STUDY N/A 06/28/2020   Procedure: GIVENS CAPSULE STUDY;  Surgeon: Irene Shipper, MD;  Location: [facility];  Service: Endoscopy;  Laterality: N/A;   RETINOPATHY SURGERY Bilateral 2013   Dr. Zigmund Daniel   Family History  Problem Relation Age of Onset   Hypertension Mother    Sleep apnea Mother    Diabetes Mother    Hyperlipidemia Mother    Lung cancer Father        smoker   Hypertension Sister    Diabetes Sister    Hypertension Brother    Hypertension Sister    Diabetes Brother    Hypertension Brother    Breast cancer Neg Hx    Colon cancer Neg Hx    Esophageal cancer Neg Hx    Pancreatic cancer Neg Hx    Liver disease Neg Hx    Colon polyps Neg Hx    Social History:  reports that she has been smoking cigarettes. She has a 17.50 pack-year smoking history. She has never used smokeless tobacco. She reports that she does not drink alcohol and does not use drugs. Allergies  Allergen Reactions   Tape Other (See Comments)    Paper tape rips skin.    Semaglutide Rash    Rybelsus 3 mg   Prior to Admission medications   Medication Sig Start Date End Date Taking? Authorizing Provider  acetaminophen (TYLENOL) 500 MG tablet  Take 1,000 mg by mouth every 6 (six) hours as needed for moderate pain.   Yes [provider]  allopurinol (ZYLOPRIM) 100 MG tablet Take 1 tablet (100 mg total) by mouth daily. 05/23/20  Yes Ghimire, Henreitta Leber, MD  amLODipine (NORVASC) 10 MG tablet TAKE 1 TABLET BY MOUTH EVERY DAY Patient taking differently: Take 10 mg by mouth daily. 03/16/20  Yes Marrian Salvage, FNP  atorvastatin (LIPITOR) 80 MG tablet TAKE 1 TABLET BY MOUTH EVERY DAY Patient taking differently: Take 80 mg by mouth daily. 03/30/20  Yes Marrian Salvage, FNP  B Complex-C-Zn-Folic Acid (DIALYVITE 989-[id number] 15) 0.8 MG TABS Take 1 tablet by mouth daily. 01/28/21  Yes [provider]  bromocriptine (PARLODEL) 2.5 MG tablet Take 0.5 tablets (1.25 mg total) by mouth daily. 02/24/20  Yes Renato Shin, MD  Calcium Acetate 667 MG TABS Take 1 tablet by mouth daily.   Yes [provider]  epoetin alfa-epbx (RETACRIT) 41740 UNIT/ML injection Inject 40,000 Units into the skin every 28 (twenty-eight) days.   Yes [provider]  hydrALAZINE (APRESOLINE) 50 MG tablet Take 1 tablet (50 mg total) by mouth every 8 (eight) hours. Patient taking differently: Take 50 mg by mouth 2 (two) times daily. 06/10/20 11/22/21 Yes Dessa Phi, DO  isosorbide mononitrate (IMDUR) 30 MG 24 hr tablet Take 1 tablet (30 mg total) by mouth daily. 06/11/20  Yes Dessa Phi, DO  lidocaine-prilocaine (EMLA) cream Apply 1 Application topically See admin instructions. Three times a week 02/05/21  Yes [provider]  metoprolol succinate (TOPROL-XL) 100 MG 24 hr tablet Take 100 mg by mouth daily.   Yes [provider]  oxyCODONE-acetaminophen (PERCOCET) 5-325 MG tablet Take 1 tablet by mouth every 6 (six) hours as needed for severe pain. 07/16/20  Yes Rhyne, Hulen Shouts, PA-C  pantoprazole (PROTONIX) 40 MG tablet Take 1 tablet (40 mg total) by mouth daily. 06/12/19  Yes Marrian Salvage, FNP  polyethylene glycol (MIRALAX / GLYCOLAX) 17 g packet Take 34-51 g by mouth daily as needed for mild constipation.   Yes [provider]  repaglinide (PRANDIN) 2 MG tablet Take 1 tablet (2 mg total) by mouth 2 (two) times daily before a meal. 10/29/21  Yes Elayne Snare, MD  sitaGLIPtin (JANUVIA) 25 MG tablet Take 1 tablet (25 mg total) by mouth daily. 06/23/21  Yes Elayne Snare, MD  Vitamin D, Ergocalciferol, (DRISDOL) 1.25 MG (50000 UNIT) CAPS capsule Take 50,000 Units by mouth once a week. 02/18/21  Yes [provider]  Blood Glucose Monitoring Suppl ([place]) w/Device KIT 1 each by Does not apply route 2 (two) times daily. E11.9    [provider]  glucose blood (ONETOUCH VERIO) test strip USE AS DIRECTED TWICE DAILY 12/12/19   Marrian Salvage, FNP  Methoxy PEG-Epoetin Beta (MIRCERA IJ) Inject 1 Syringe into the muscle See admin instructions. Given at dialysis 01/14/21 03/01/22  [provider]  OneTouch Delica Lancets 81K MISC 1 each by Does not apply route 2 (two) times daily. E11.9    [provider]   Current Facility-Administered Medications  Medication Dose Route Frequency Provider Last Rate Last Admin   acetaminophen (TYLENOL) tablet 650 mg  650 mg Oral Q6H PRN Karmen Bongo, MD       Or   acetaminophen (TYLENOL) suppository 650 mg  650 mg Rectal Q6H PRN Karmen Bongo, MD       albuterol (PROVENTIL) (2.5 MG/3ML) 0.083% nebulizer solution 3  mL  3 mL Inhalation Q4H PRN Karmen Bongo, MD       allopurinol (ZYLOPRIM) tablet 100 mg  100 mg Oral Daily Karmen Bongo, MD   100 mg at 11/23/21 0844   amLODipine (NORVASC) tablet 10 mg  10 mg Oral Daily Karmen Bongo, MD   10 mg at 11/23/21 0846   atorvastatin (LIPITOR) tablet 80 mg  80 mg Oral Daily Karmen Bongo, MD   80 mg at 11/23/21 9030   bromocriptine (PARLODEL) tablet 1.25 mg  1.25 mg Oral Daily Karmen Bongo, MD   1.25 mg at 11/23/21 0846   calcium acetate (PHOSLO) capsule 667 mg  667 mg Oral Daily Karmen Bongo, MD   667 mg at 11/23/21 0845   calcium carbonate (dosed in mg elemental calcium) suspension 500 mg of elemental calcium  500 mg of elemental calcium Oral Q6H PRN Karmen Bongo, MD       camphor-menthol [facility]) lotion 1 Application  1 Application Topical S9Q PRN Karmen Bongo, MD       And   hydrOXYzine (ATARAX) tablet 25 mg  25 mg Oral Q8H PRN Karmen Bongo, MD       Chlorhexidine Gluconate Cloth 2 % PADS 6 each  6 each Topical Q0600 Roney Jaffe, MD   6 each at 11/23/21 0908   dextrose 10 % infusion   Intravenous Continuous Karmen Bongo, MD   Held at 11/23/21 0612   docusate sodium (ENEMEEZ) enema 283 mg  1  enema Rectal PRN Karmen Bongo, MD       feeding supplement (NEPRO CARB STEADY) liquid 237 mL  237 mL Oral TID PRN Karmen Bongo, MD       heparin injection 5,000 Units  5,000 Units Subcutaneous Lynne Logan, MD   5,000 Units at 11/23/21 3300   hydrALAZINE (APRESOLINE) injection 5 mg  5 mg Intravenous Q4H PRN Karmen Bongo, MD       hydrALAZINE (APRESOLINE) tablet 50 mg  50 mg Oral BID Karmen Bongo, MD   50 mg at 11/23/21 0845   insulin aspart (novoLOG) injection 0-6 Units  0-6 Units Subcutaneous TID WC Karmen Bongo, MD       isosorbide mononitrate (IMDUR) 24 hr tablet 30 mg  30 mg Oral Daily Karmen Bongo, MD   30 mg at 11/23/21 0845   metoprolol succinate (TOPROL-XL) 24 hr tablet 100 mg  100 mg Oral Daily Karmen Bongo, MD   100 mg at 11/23/21 7622   multivitamin with minerals tablet 1 tablet  1 tablet Oral Daily Karmen Bongo, MD   1 tablet at 11/23/21 0845   nicotine (NICODERM CQ - dosed in mg/24 hours) patch 14 mg  14 mg Transdermal Daily PRN Karmen Bongo, MD       ondansetron [facility]) tablet 4 mg  4 mg Oral Q6H PRN Karmen Bongo, MD       Or   ondansetron [facility]) injection 4 mg  4 mg Intravenous Q6H PRN Karmen Bongo, MD       oxyCODONE-acetaminophen (PERCOCET/ROXICET) 5-325 MG per tablet 1 tablet  1 tablet Oral Q6H PRN Karmen Bongo, MD       pantoprazole (PROTONIX) EC tablet 40 mg  40 mg Oral Daily Karmen Bongo, MD   40 mg at 11/23/21 0845   polyethylene glycol (MIRALAX / GLYCOLAX) packet 34-51 g  34-51 g Oral Daily PRN Karmen Bongo, MD       sodium chloride flush (NS) 0.9 % injection 3 mL  3 mL Intravenous Q12H Karmen Bongo,  MD   3 mL at 11/23/21 0908   sorbitol 70 % solution 30 mL  30 mL Oral PRN Karmen Bongo, MD       zolpidem Lorrin Mais) tablet 5 mg  5 mg Oral QHS PRN Karmen Bongo, MD         ROS: As per HPI otherwise negative.  Physical Exam: Vitals:   11/22/21 1834 11/22/21 2130 11/22/21 2203 11/23/21 0841  BP: (!) 164/52  (!)  124/49 (!) 157/50  Pulse:  61 (!) 57 65  Resp:  _0 Temp:   98.5 F (36.9 C) 98.5 F (36.9 C)  TempSrc:   Oral Oral  SpO2:  99% 100% 100%  Weight:      Height:         General: Appears comfortable, in no distress  Head: NCAT sclera not icteric MMM Neck: Supple. No JVD appreciated  Lungs: Clear bilaterally without wheezes, rales, or rhonchi. Normal WOB  Heart: RRR, no murmur, rub, or gallop  Abdomen: soft non-tender, bowel sounds normal, no masses  Lower extremities:without edema or ischemic changes, no open wounds  Neuro: A & O X 3. Moves all extremities spontaneously. Psych:  Responds to questions appropriately with a normal affect. Dialysis Access: L AVF +bruit   Labs: Basic Metabolic Panel: Recent Labs  Lab 11/22/21 1200 11/22/21 1539 11/23/21 0347  NA 128* 127* 127*  K 3.7 3.6 3.8  CL 93* 93* 93*  CO2 _1 GLUCOSE 97 78 101*  BUN 50* 50* 50*  CREATININE 6.85* 6.69* 7.00*  CALCIUM 9.0 8.7* 8.6*   Liver Function Tests: Recent Labs  Lab 11/22/21 1200  AST 50*  ALT 32  ALKPHOS 58  BILITOT 0.8  PROT 6.8  ALBUMIN 3.3*   No results for input(s): "LIPASE", "AMYLASE" in the last 168 hours. No results for input(s): "AMMONIA" in the last 168 hours. CBC: Recent Labs  Lab 11/22/21 1200 11/23/21 0347  WBC 6.2 5.7  NEUTROABS 4.7  --   HGB 10.6* 8.5*  HCT 31.1* 24.2*  MCV 101.3* 99.2  PLT 180 153   Cardiac Enzymes: No results for input(s): "CKTOTAL", "CKMB", "CKMBINDEX", "TROPONINI" in the last 168 hours. CBG: Recent Labs  Lab 11/22/21 1726 11/22/21 2209 11/23/21 0029 11/23/21 0323 11/23/21 0838  GLUCAP 119* 95 131* 104* 78   Iron Studies: No results for input(s): "IRON", "TIBC", "TRANSFERRIN", "FERRITIN" in the last 72 hours. Studies/Results: DG Chest 2 View  Result Date: 11/22/2021 CLINICAL DATA:  Cough, fever. EXAM: CHEST - 2 VIEW COMPARISON:  June 30, 2020. FINDINGS: Stable cardiomegaly. Minimal right midlung subsegmental atelectasis  is noted. Probable peribronchial thickening is noted in left lower lobe. The visualized skeletal structures are unremarkable. IMPRESSION: Probable peribronchial thickening seen in left lower lobe concerning for bronchitis. This was present to some degree on the prior exam suggesting possible chronic bronchitis. Electronically Signed   By: Marijo Conception M.D.   On: 11/22/2021 12:59    Dialysis Orders:  [facility] TTS 3.5h 350/A1.5x EDW 55.5kg 2K/2Ca AVF 16g Heparin 4000 -Mircera 50 q 4 wks (last 9/30) -Calcitriol 1.0 mcg TIW   Last HD 10/26 Post wt 55.8kg   Assessment/Plan: Hypoglycemia/DM - with Covid infection/diarrhea/poor intake. Per primary team.   COVID+ - airborne precautions ESRD -  HD TTS. Continue on schedule. K 3.8 Orders for HD today.  Hypertension/volume  - BP/volume ok.  Continue home meds. UF to EDW Anemia  - Hb 8.5 Resume ESA with HD today.  Metabolic  bone disease -  Continue Phoslo binder/Calcitriol  Nutrition - Encourage intake/renal diet +supp/fluid restriction   Lynnda Child PA-C [facility] 11/23/2021, 11:11 AM

## 2021-11-23 NOTE — Care Management Obs Status (Signed)
MEDICARE OBSERVATION STATUS NOTIFICATION   Patient Details  Name: [name] MRN: [id number] Date of Birth: Apr 11, 1950   Medicare Observation Status Notification Given:  Yes    Sharin Mons, RN 11/23/2021, 4:40 PM

## 2021-11-24 DIAGNOSIS — U071 COVID-19: Secondary | ICD-10-CM | POA: Diagnosis not present

## 2021-11-24 DIAGNOSIS — E162 Hypoglycemia, unspecified: Secondary | ICD-10-CM | POA: Diagnosis not present

## 2021-11-24 LAB — BASIC METABOLIC PANEL
Anion gap: 10 (ref 5–15)
BUN: 18 mg/dL (ref 8–23)
CO2: 31 mmol/L (ref 22–32)
Calcium: 9 mg/dL (ref 8.9–10.3)
Chloride: 94 mmol/L — ABNORMAL LOW (ref 98–111)
Creatinine, Ser: 3.56 mg/dL — ABNORMAL HIGH (ref 0.44–1.00)
GFR, Estimated: 13 mL/min — ABNORMAL LOW (ref >60)
Glucose, Bld: 81 mg/dL (ref 70–99)
Potassium: 3.2 mmol/L — ABNORMAL LOW (ref 3.5–5.1)
Sodium: 135 mmol/L (ref 135–145)

## 2021-11-24 LAB — GLUCOSE, CAPILLARY
Glucose-Capillary: 83 mg/dL (ref 70–99)
Glucose-Capillary: 76 mg/dL (ref 70–99)
Glucose-Capillary: 94 mg/dL (ref 70–99)
Glucose-Capillary: 96 mg/dL (ref 70–99)
Glucose-Capillary: 206 mg/dL — ABNORMAL HIGH (ref 70–99)

## 2021-11-24 LAB — HEPATITIS B SURFACE ANTIBODY, QUANTITATIVE: Hep B S AB Quant (Post): 144.4 m[IU]/mL (ref >9.9)

## 2021-11-24 MED ORDER — DARBEPOETIN ALFA 60 MCG/0.3ML IJ SOSY
60.0000 ug | PREFILLED_SYRINGE | INTRAMUSCULAR | Status: DC
  Administered 2021-11-24: 60 ug via SUBCUTANEOUS
  Filled 2021-11-24: qty 0.3

## 2021-11-24 MED ORDER — POTASSIUM CHLORIDE 20 MEQ PO PACK
20.0000 meq | PACK | Freq: Once | ORAL | Status: AC
  Administered 2021-11-24: 20 meq via ORAL
  Filled 2021-11-24: qty 1

## 2021-11-24 NOTE — Progress Notes (Signed)
[facility] Progress Note   Subjective: Isolation for covid. Sitting up in chair at bedside with lunch. HD 10/31 Net UF 1L.   Objective Vitals:   11/24/21 0023 11/24/21 0152 11/24/21 0753 11/24/21 0939  BP: (!) 157/54 (!) 162/54 (!) 163/58   Pulse: 61 64 (!) 58 64  Resp: '15 16 15   '$ Temp: 98.4 F (36.9 C)  98.7 F (37.1 C)   TempSrc: Oral  Oral   SpO2: 99%  96%   Weight:      Height:       Physical Exam General: Pleasant elderly female looks younger age Heart: S1,S2 RRR No M/R//G Lungs: CTAB No WOB Abdomen: NABS Extremities: No LE edema Dialysis Access: L AVF+T/B pulsatile   Additional Objective Labs: Basic Metabolic Panel: Recent Labs  Lab 11/22/21 1539 11/23/21 0347 11/24/21 0519  NA 127* 127* 135  K 3.6 3.8 3.2*  CL 93* 93* 94*  CO2 '24 22 31  '$ GLUCOSE 78 101* 81  BUN 50* 50* 18  CREATININE 6.69* 7.00* 3.56*  CALCIUM 8.7* 8.6* 9.0   Liver Function Tests: Recent Labs  Lab 11/22/21 1200  AST 50*  ALT 32  ALKPHOS 58  BILITOT 0.8  PROT 6.8  ALBUMIN 3.3*   No results for input(s): "LIPASE", "AMYLASE" in the last 168 hours. CBC: Recent Labs  Lab 11/22/21 1200 11/23/21 0347  WBC 6.2 5.7  NEUTROABS 4.7  --   HGB 10.6* 8.5*  HCT 31.1* 24.2*  MCV 101.3* 99.2  PLT 180 153   Blood Culture    Component Value Date/Time   SDES URINE, RANDOM 06/30/2020 1951   SPECREQUEST  06/30/2020 1951    NONE Performed at [facility], [place] [address], [place], [place] [address]    CULT MULTIPLE SPECIES PRESENT, SUGGEST RECOLLECTION (A) 06/30/2020 1951   REPTSTATUS 07/02/2020 FINAL 06/30/2020 1951    Cardiac Enzymes: No results for input(s): "CKTOTAL", "CKMB", "CKMBINDEX", "TROPONINI" in the last 168 hours. CBG: Recent Labs  Lab 11/23/21 1613 11/23/21 1959 11/24/21 0222 11/24/21 0751 11/24/21 1119  GLUCAP 168* 113* 83 76 94   Iron Studies: No results for input(s): "IRON", "TIBC", "TRANSFERRIN", "FERRITIN" in the last 72  hours. '@lablastinr3'$ @ Studies/Results: No results found. Medications:  dextrose 10 mL/hr at 11/24/21 1328    allopurinol  100 mg Oral Daily   amLODipine  10 mg Oral Daily   atorvastatin  80 mg Oral Daily   bromocriptine  1.25 mg Oral Daily   calcium acetate  667 mg Oral Daily   Chlorhexidine Gluconate Cloth  6 each Topical Q0600   darbepoetin (ARANESP) injection - DIALYSIS  60 mcg Subcutaneous Q Tue-1800   heparin  5,000 Units Subcutaneous Q8H   hydrALAZINE  50 mg Oral BID   insulin aspart  0-6 Units Subcutaneous TID WC   isosorbide mononitrate  30 mg Oral Daily   metoprolol succinate  100 mg Oral Daily   multivitamin with minerals  1 tablet Oral Daily   pantoprazole  40 mg Oral Daily   sodium chloride flush  3 mL Intravenous Q12H   Dialysis Orders:  [facility] TTS 3.5h 350/A1.5x EDW 55.5kg 2K/2Ca AVF 16g  - Heparin 4000 units IV TIW - Mircera 50 mcg IV q 4 wks (last 9/30) - Calcitriol 1.0 mcg PO TIW    Last HD 10/26 Post wt 55.8kg    Assessment/Plan: Hypoglycemia/DM - with Covid infection/diarrhea/poor intake. Per primary team.   COVID+ - airborne precautions Hypokalemia-give KCL 20 MEQ X 1  dose. Follow labs ESRD -  HD TTS. Next HD 11/25/2021  Hypertension/volume  - BP/volume ok.  Continue home meds. UF to EDW Anemia  - Hb 8.5 Resume ESA with HD today.  Metabolic bone disease -  Continue Phoslo binder/Calcitriol  Nutrition - Encourage intake/renal diet +supp/fluid restriction   [name] H. Pippa Hanif NP-C 11/24/2021, 2:09 PM  [employer] [phone]

## 2021-11-24 NOTE — Care Management (Signed)
  Transition of Care [facility]) Screening Note   Patient Details  Name: [name] Date of Birth: [birth year]   Transition of Care [facility]) CM/SW Contact:    Bethena Roys, RN Phone Number: 11/24/2021, 3:00 PM    Transition of Care Department [facility]) has reviewed the patient. PTA patient states she was from home alone. Patient states family lives next door. Physical Therapy recommendations are for DME tub bench. Case Manager spoke with the patient and she is agreeable to the tub bench via [employer]-agency choice. Referral submitted to [employer] via parachute and DME will be delivered to the room prior to transition home. Case Manager will continue to monitor patient advancement through interdisciplinary progression rounds. If new patient transition needs arise, please place a TOC consult.

## 2021-11-24 NOTE — Progress Notes (Signed)
Pt not stable for d/c today per attending. Pt for possible d/c tomorrow. Contacted pt's clinic and awaiting a return call to see if they can possibly treat pt tomorrow afternoon or Friday if pt stable for d/c tomorrow morning. Contacted inpt HD unit to request that pt be placed on 2nd shift for tomorrow in the event pt stable for d/c in the morning and can receive treatment at her out-pt clinic. Pt drives self to HD appts.   Melven Sartorius Renal Navigator [phone]

## 2021-11-24 NOTE — Inpatient Diabetes Management (Signed)
Inpatient Diabetes Program Recommendations  AACE/ADA: New Consensus Statement on Inpatient Glycemic Control   Target Ranges:  Prepandial:   less than 140 mg/dL      Peak postprandial:   less than 180 mg/dL (1-2 hours)      Critically ill patients:  140 - 180 mg/dL    Latest Reference Range & Units 11/23/21 08:38 11/23/21 11:16 11/23/21 16:13 11/23/21 19:59 11/24/21 02:22 11/24/21 07:51  Glucose-Capillary 70 - 99 mg/dL 78 83 168 (H) 113 (H) 83 76    Latest Reference Range & Units 06/23/21 13:14 11/23/21 06:46  Hemoglobin A1C 4.8 - 5.6 % 6.0 ! 5.2   Review of Glycemic Control  Diabetes history: DM2 Outpatient Diabetes medications: Prandin 2 mg BID, Januvia 25 mg daily Current orders for Inpatient glycemic control: Novolog 0-6 units TID with meals   Inpatient Diabetes Program Recommendations:     HbgA1C:  A1C 5.2% on 11/23/21 indicating an average glucose of 103 mg/d/l over the past 2-3 months.   Outpatient: Given admission with hypoglycemia and A1C of 5.2%, would recommend to discontinue Prandin as an outpatient.  Please provide Rx for glucose monitoring kit (#[id number]) at discharge.   Thanks, Barnie Alderman, RN, MSN, [place] Diabetes Coordinator Inpatient Diabetes Program [phone] (Team Pager from 8am to [place])

## 2021-11-24 NOTE — Progress Notes (Addendum)
Mobility Specialist Progress Note   11/24/21 1113  Mobility  Activity Ambulated with assistance in room  Level of Assistance Contact guard assist, steadying assist  Assistive Device None  Distance Ambulated (ft) 80 ft (4x20')  Activity Response Tolerated well  $Mobility charge 1 Mobility   Pre Mobility: 61 HR, 135/49 BP, 99% SpO2 During Mobility: 68 HR, 98% SpO2 Post Mobility: 64 HR, 133/57 BP, 100% SpO2  Received pt in bed c/o lack of sleep but agreeable. Ambulated at a minG level but x1 slight LOB when turning, pt able to correct self. Returned to chair w/ call bell in reach and lunch tray in front.  [place]land Falling Mobility Specialist Acute Rehab Office:  [phone]

## 2021-11-24 NOTE — Evaluation (Signed)
Physical Therapy Evaluation & Discharge Patient Details Name: [name] MRN: [id number] DOB: 1951/01/04 Today's Date: 11/24/2021  History of Present Illness  Pt is a 71 y.o. female who presented 11/22/21 with hypoglycemia and COVID. PMH: DM, HLD, HTN, ESRD on TTS HD, thyroid disease, heart murmur, CVA   Clinical Impression  Pt presents with condition above. PTA, she was mod I for mobility, intermittently holding onto furniture for support. She lives alone in a 1-level house with 2 STE and endorses x1 fall when washing her hair in the shower. Currently, pt reports and appears to be functioning near if not at her baseline, not needing any physical assistance or displaying any LOB with mobility without UE support (intermittently would reach for furniture). She reports chronic R shoulder weakness/ROM deficits secondary to arthritis. Educated her on moderate intensity strengthening exercises to address this deficit. Encouraged pt to continue to mobilize with staff while here to reduce risk of deconditioning. She verbalized understanding. All education completed and questions answered. PT will sign off.       Recommendations for follow up therapy are one component of a multi-disciplinary discharge planning process, led by the attending physician.  Recommendations may be updated based on patient status, additional functional criteria and insurance authorization.  Follow Up Recommendations No PT follow up      Assistance Recommended at Discharge PRN  Patient can return home with the following  Assistance with cooking/housework    Equipment Recommendations Other (comment) (tub bench)  Recommendations for Other Services       Functional Status Assessment Patient has not had a recent decline in their functional status     Precautions / Restrictions Precautions Precautions: Fall (low risk) Restrictions Weight Bearing Restrictions: No      Mobility  Bed Mobility                General bed mobility comments: Pt sitting up in chair upon arrival.    Transfers Overall transfer level: Modified independent Equipment used: None               General transfer comment: Slightly increased time to come to stand from chair, but no assistance needed.    Ambulation/Gait Ambulation/Gait assistance: Supervision Gait Distance (Feet): 180 Feet (x2 bouts of ~180 ft > ~20 ft) Assistive device: None, 1 person hand held assist Gait Pattern/deviations: Step-through pattern, Decreased stride length Gait velocity: reduced Gait velocity interpretation: <1.8 ft/sec, indicate of risk for recurrent falls   General Gait Details: Pt with slow, but mostly steady gait, ambulating laps in the room. Pt intermittently reaching out to place a hand on furniture, but no LOB, supervision for safety.  Stairs            Wheelchair Mobility    Modified Rankin (Stroke Patients Only)       Balance Overall balance assessment: Mild deficits observed, not formally tested                                           Pertinent Vitals/Pain Pain Assessment Pain Assessment: Faces Faces Pain Scale: No hurt Pain Intervention(s): Monitored during session    Home Living Family/patient expects to be discharged to:: Private residence Living Arrangements: Alone Available Help at Discharge: Family;Available PRN/intermittently (sister lives next door) Type of Home: House Home Access: Stairs to enter Entrance Stairs-Rails: Can reach both Entrance Stairs-Number of Steps: 2  Home Layout: One level Home Equipment: Conservation officer, nature (2 wheels);Cane - single point;Grab bars - tub/shower (grab bars are suction cup)      Prior Function Prior Level of Function : Independent/Modified Independent;Driving             Mobility Comments: Holds onto furniture intermittently. x1 fall when washing hair in shower.       Hand Dominance        Extremity/Trunk Assessment    Upper Extremity Assessment Upper Extremity Assessment: RUE deficits/detail RUE Deficits / Details: slight shoulder weakness noted with pt reporting chronic weakness due to arthritis, otherwise [facility]    Lower Extremity Assessment Lower Extremity Assessment: Overall WFL for tasks assessed (MMT scores of 4+ to 5 grossly bil; denied numbness/tingling bil)    Cervical / Trunk Assessment Cervical / Trunk Assessment: Normal  Communication   Communication: No difficulties  Cognition Arousal/Alertness: Awake/alert Behavior During Therapy: WFL for tasks assessed/performed Overall Cognitive Status: Within Functional Limits for tasks assessed                                          General Comments General comments (skin integrity, edema, etc.): SpO2 >/= 95% on RA throughout    Exercises     Assessment/Plan    PT Assessment Patient does not need any further PT services  PT Problem List         PT Treatment Interventions      PT Goals (Current goals can be found in the Care Plan section)  Acute Rehab PT Goals Patient Stated Goal: to not have to step over her tub PT Goal Formulation: All assessment and education complete, DC therapy Time For Goal Achievement: 11/25/21 Potential to Achieve Goals: Good    Frequency       Co-evaluation               AM-PAC PT "6 Clicks" Mobility  Outcome Measure Help needed turning from your back to your side while in a flat bed without using bedrails?: None Help needed moving from lying on your back to sitting on the side of a flat bed without using bedrails?: None Help needed moving to and from a bed to a chair (including a wheelchair)?: None Help needed standing up from a chair using your arms (e.g., wheelchair or bedside chair)?: None Help needed to walk in hospital room?: A Little Help needed climbing 3-5 steps with a railing? : A Little 6 Click Score: 22    End of Session   Activity Tolerance: Patient tolerated  treatment well Patient left: in chair;with call bell/phone within reach   PT Visit Diagnosis: Unsteadiness on feet (R26.81);Other abnormalities of gait and mobility (R26.89)    Time: 0086-7619 PT Time Calculation (min) (ACUTE ONLY): 18 min   Charges:   PT Evaluation $PT Eval Low Complexity: 1 Low          Moishe Spice, PT, DPT Acute Rehabilitation Services  Office: [phone]   Orvan Falconer 11/24/2021, 12:53 PM

## 2021-11-24 NOTE — Progress Notes (Signed)
Pt receives out-pt HD at [facility]) on TTS. Pt has a 5:45 am chair time. Clinic advised pt positive for covid on 10/30. Navigator will need to contact clinic prior to pt's d/c to make sure that pt's chair time will remain the same at d/c due to clinic changing some schedules for pts. Will assist as needed.   Melven Sartorius Renal Navigator [phone]

## 2021-11-24 NOTE — Progress Notes (Signed)
CSW spoke with pt regarding food insecurity based on SDOH screening.  Pt reports she receives $23/month food stamps but does have issues with obtaining adequate food.  She is familiar with [employer] and has used their Building surveyor.  Pt is open to referrals through [place] [facility] to other food agencies, verbal permission given to send out referral. Lurline Idol, MSW, LCSW 11/1/20231:11 PM

## 2021-11-24 NOTE — Progress Notes (Signed)
[facility] PROGRESS NOTE    Progress Note  [name]  KVQ:[id number] DOB: 27-Oct-1950 DOA: 11/22/2021 PCP: Erby Pian, PA-C     Brief Narrative:   [name] is an 70 y.o. female past medical history of chronic back pain, diabetes mellitus type 2, essential hypertension, end-stage renal disease on hemodialysis Tuesday Thursdays and Saturdays, had been to her regular dialysis woke up dizzy postdialysis with anorexia and diarrhea, she tested positive for COVID-19 was brought into the ED was found hypoglycemic in the 40s   Assessment/Plan:   Generalized weakness probably due to hypoglycemia: Started on D10 her blood glucose corrected, her hemoglobin A1c is 5.2. She will probably need to be discharged off hypoglycemic agents. KVO D10.  Will observe off D10 to see if her glucoses remained stable. She remains anorexic. She relates she still weak consult physical therapy  COVID continue viral infection: Mild symptoms cough diarrhea and anorexia. Started 4 days ago not requiring oxygen. Continue to observe.  End-stage renal disease on hemodialysis: Nephrology was consulted continue current regimen.  Essential hypertension: No change made to his medication continue current regimen.  Hyperlipidemia: Continue statins.  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   DVT prophylaxis: lovenox Family Communication:noen Status is: Observation The patient remains OBS appropriate and will d/c before 2 midnights.    Code Status:     Code Status Orders  (From admission, onward)           Start     Ordered   11/22/21 1728  Full code  Continuous        11/22/21 1729           Code Status History     Date Active Date Inactive Code Status Order ID Comments User Context   06/27/2020 0007 06/29/2020 1636 Full Code [id number]  Elwyn Reach, MD ED   06/03/2020 0737 06/10/2020 1739 Full Code [id number]   Vernelle Emerald, MD ED   05/20/2020 2255 05/22/2020 2212 Full Code [id number]  Reubin Milan, MD Inpatient   05/19/2020 0601 05/20/2020 2255 Full Code [id number]  Chotiner, Yevonne Aline, MD ED   11/28/2019 0208 12/03/2019 1906 Full Code [id number]  Edmonia Lynch, DO ED   09/09/2017 0627 09/09/2017 2259 Full Code [id number]  Rise Patience, MD Inpatient   01/08/2015 1807 01/10/2015 2108 Full Code [id number]  Radene Gunning, NP Inpatient   09/13/2013 1546 09/15/2013 1707 Full Code [id number]  Delfina Redwood, MD Inpatient      Advance Directive Documentation    Flowsheet Row Most Recent Value  Type of Advance Directive Living will  Pre-existing out of facility DNR order (yellow form or pink MOST form) --  "MOST" Form in Place? --         IV Access:   Peripheral IV   Procedures and diagnostic studies:   DG Chest 2 View  Result Date: 11/22/2021 CLINICAL DATA:  Cough, fever. EXAM: CHEST - 2 VIEW COMPARISON:  June 30, 2020. FINDINGS: Stable cardiomegaly. Minimal right midlung subsegmental atelectasis is noted. Probable peribronchial thickening is noted in left lower lobe. The visualized skeletal structures are unremarkable. IMPRESSION: Probable peribronchial thickening seen in left lower lobe concerning for bronchitis. This was present to some degree on the prior exam suggesting possible chronic bronchitis. Electronically Signed   By: Marijo Conception M.D.   On: 11/22/2021 12:59     Medical Consultants:   None.   Subjective:  [name] no complaints except that she is not hungry  Objective:    Vitals:   11/24/21 0016 11/24/21 0023 11/24/21 0152 11/24/21 0753  BP: (!) 137/55 (!) 157/54 (!) 162/54 (!) 163/58  Pulse: (!) 59 61 64 (!) 58  Resp: '13 15 16 15  '$ Temp:  98.4 F (36.9 C)  98.7 F (37.1 C)  TempSrc:  Oral  Oral  SpO2: 99% 99%  96%  Weight:      Height:       SpO2: 96 %   Intake/Output Summary (Last 24 hours) at 11/24/2021 0911 Last data filed  at 11/24/2021 0023 Gross per 24 hour  Intake 357 ml  Output 1000 ml  Net -643 ml   Filed Weights   11/22/21 1142 11/23/21 2042  Weight: 56.7 kg 56 kg    Exam: General exam: In no acute distress. Respiratory system: Good air movement and clear to auscultation. Cardiovascular system: S1 & S2 heard, RRR. No JVD.  Gastrointestinal system: Abdomen is nondistended, soft and nontender.  Extremities: No pedal edema. Skin: No rashes, lesions or ulcers Psychiatry: Judgement and insight appear normal. Mood & affect appropriate.    Data Reviewed:    Labs: Basic Metabolic Panel: Recent Labs  Lab 11/22/21 1200 11/22/21 1539 11/23/21 0347 11/24/21 0519  NA 128* 127* 127* 135  K 3.7 3.6 3.8 3.2*  CL 93* 93* 93* 94*  CO2 '23 24 22 31  '$ GLUCOSE 97 78 101* 81  BUN 50* 50* 50* 18  CREATININE 6.85* 6.69* 7.00* 3.56*  CALCIUM 9.0 8.7* 8.6* 9.0   GFR Estimated Creatinine Clearance: 11.5 mL/min (A) (by C-G formula based on SCr of 3.56 mg/dL (H)). Liver Function Tests: Recent Labs  Lab 11/22/21 1200  AST 50*  ALT 32  ALKPHOS 58  BILITOT 0.8  PROT 6.8  ALBUMIN 3.3*   No results for input(s): "LIPASE", "AMYLASE" in the last 168 hours. No results for input(s): "AMMONIA" in the last 168 hours. Coagulation profile No results for input(s): "INR", "PROTIME" in the last 168 hours. COVID-19 Labs  No results for input(s): "DDIMER", "FERRITIN", "LDH", "CRP" in the last 72 hours.  Lab Results  Component Value Date   SARSCOV2NAA POSITIVE (A) 11/22/2021   SARSCOV2NAA NEGATIVE 06/26/2020   SARSCOV2NAA NEGATIVE 06/03/2020   [place] NEGATIVE 05/19/2020    CBC: Recent Labs  Lab 11/22/21 1200 11/23/21 0347  WBC 6.2 5.7  NEUTROABS 4.7  --   HGB 10.6* 8.5*  HCT 31.1* 24.2*  MCV 101.3* 99.2  PLT 180 153   Cardiac Enzymes: No results for input(s): "CKTOTAL", "CKMB", "CKMBINDEX", "TROPONINI" in the last 168 hours. BNP (last 3 results) No results for input(s): "PROBNP" in the last  8760 hours. CBG: Recent Labs  Lab 11/23/21 1116 11/23/21 1613 11/23/21 1959 11/24/21 0222 11/24/21 0751  GLUCAP 83 168* 113* 83 76   D-Dimer: No results for input(s): "DDIMER" in the last 72 hours. Hgb A1c: Recent Labs    11/23/21 0646  HGBA1C 5.2   Lipid Profile: No results for input(s): "CHOL", "HDL", "LDLCALC", "TRIG", "CHOLHDL", "LDLDIRECT" in the last 72 hours. Thyroid function studies: No results for input(s): "TSH", "T4TOTAL", "T3FREE", "THYROIDAB" in the last 72 hours.  Invalid input(s): "FREET3" Anemia work up: No results for input(s): "VITAMINB12", "FOLATE", "FERRITIN", "TIBC", "IRON", "RETICCTPCT" in the last 72 hours. Sepsis Labs: Recent Labs  Lab 11/22/21 1200 11/23/21 0347  WBC 6.2 5.7   Microbiology Recent Results (from the past 240 hour(s))  SARS Coronavirus 2 by  RT PCR (hospital order, performed in [facility] hospital lab) *cepheid single result test* Anterior Nasal Swab     Status: Abnormal   Collection Time: 11/22/21 11:39 AM   Specimen: Anterior Nasal Swab  Result Value Ref Range Status   SARS Coronavirus 2 by RT PCR POSITIVE (A) NEGATIVE Final    Comment: (NOTE) SARS-CoV-2 target nucleic acids are DETECTED  SARS-CoV-2 RNA is generally detectable in upper respiratory specimens  during the acute phase of infection.  Positive results are indicative  of the presence of the identified virus, but do not rule out bacterial infection or co-infection with other pathogens not detected by the test.  Clinical correlation with patient history and  other diagnostic information is necessary to determine patient infection status.  The expected result is negative.  Fact Sheet for Patients:   https://www.patel.info/   Fact Sheet for Healthcare Providers:   https://hall.com/    This test is not yet approved or cleared by the [place] FDA and  has been authorized for detection and/or diagnosis of SARS-CoV-2  by FDA under an Emergency Use Authorization (EUA).  This EUA will remain in effect (meaning this test can be used) for the duration of  the COVID-19 declaration under Section 564(b)(1)  of the Act, 21 U.S.C. section 360-bbb-3(b)(1), unless the authorization is terminated or revoked sooner.   Performed at [facility], [place] [address] [place], [place] [address]      Medications:    allopurinol  100 mg Oral Daily   amLODipine  10 mg Oral Daily   atorvastatin  80 mg Oral Daily   bromocriptine  1.25 mg Oral Daily   calcium acetate  667 mg Oral Daily   Chlorhexidine Gluconate Cloth  6 each Topical Q0600   darbepoetin (ARANESP) injection - DIALYSIS  60 mcg Subcutaneous Q Tue-1800   heparin  5,000 Units Subcutaneous Q8H   hydrALAZINE  50 mg Oral BID   insulin aspart  0-6 Units Subcutaneous TID WC   isosorbide mononitrate  30 mg Oral Daily   metoprolol succinate  100 mg Oral Daily   multivitamin with minerals  1 tablet Oral Daily   pantoprazole  40 mg Oral Daily   sodium chloride flush  3 mL Intravenous Q12H   Continuous Infusions:  dextrose Stopped (11/23/21 0612)      LOS: 0 days   Charlynne Cousins  [facility]  11/24/2021, 9:11 AM

## 2021-11-25 DIAGNOSIS — U071 COVID-19: Secondary | ICD-10-CM | POA: Diagnosis not present

## 2021-11-25 DIAGNOSIS — E871 Hypo-osmolality and hyponatremia: Secondary | ICD-10-CM

## 2021-11-25 DIAGNOSIS — E162 Hypoglycemia, unspecified: Secondary | ICD-10-CM | POA: Diagnosis not present

## 2021-11-25 LAB — GLUCOSE, CAPILLARY
Glucose-Capillary: 79 mg/dL (ref 70–99)
Glucose-Capillary: 90 mg/dL (ref 70–99)

## 2021-11-25 MED ORDER — BLOOD GLUCOSE MONITOR KIT: PACK | 0 refills | Status: AC

## 2021-11-25 NOTE — Progress Notes (Signed)
[name] Progress Note   Subjective: No C/Os. Discharge orders sent.     Objective Vitals:   11/24/21 0939 11/24/21 1627 11/24/21 2130 11/25/21 0744  BP:  (!) 129/57 (!) 152/86 (!) 164/47  Pulse: 64  (!) 58 (!) 58  Resp:  '17 17 17  '$ Temp:  98.5 F (36.9 C) 98.5 F (36.9 C) 98.5 F (36.9 C)  TempSrc:  Oral Oral   SpO2:  98% 100% 99%  Weight:      Height:       Physical Exam General: Pleasant elderly female looks younger age Heart: S1,S2 RRR No M/R//G Lungs: CTAB No WOB Abdomen: NABS Extremities: No LE edema Dialysis Access: L AVF+T/B pulsatile   Additional Objective Labs: Basic Metabolic Panel: Recent Labs  Lab 11/22/21 1539 11/23/21 0347 11/24/21 0519  NA 127* 127* 135  K 3.6 3.8 3.2*  CL 93* 93* 94*  CO2 '24 22 31  '$ GLUCOSE 78 101* 81  BUN 50* 50* 18  CREATININE 6.69* 7.00* 3.56*  CALCIUM 8.7* 8.6* 9.0   Liver Function Tests: Recent Labs  Lab 11/22/21 1200  AST 50*  ALT 32  ALKPHOS 58  BILITOT 0.8  PROT 6.8  ALBUMIN 3.3*   No results for input(s): "LIPASE", "AMYLASE" in the last 168 hours. CBC: Recent Labs  Lab 11/22/21 1200 11/23/21 0347  WBC 6.2 5.7  NEUTROABS 4.7  --   HGB 10.6* 8.5*  HCT 31.1* 24.2*  MCV 101.3* 99.2  PLT 180 153   Blood Culture    Component Value Date/Time   SDES URINE, RANDOM 06/30/2020 1951   SPECREQUEST  06/30/2020 1951    NONE Performed at [name], [place] [address], [place], [name] [address]    CULT MULTIPLE SPECIES PRESENT, SUGGEST RECOLLECTION (A) 06/30/2020 1951   REPTSTATUS 07/02/2020 FINAL 06/30/2020 1951    Cardiac Enzymes: No results for input(s): "CKTOTAL", "CKMB", "CKMBINDEX", "TROPONINI" in the last 168 hours. CBG: Recent Labs  Lab 11/24/21 1119 11/24/21 1625 11/24/21 2230 11/25/21 0339 11/25/21 0747  GLUCAP 94 96 206* 79 90   Iron Studies: No results for input(s): "IRON", "TIBC", "TRANSFERRIN", "FERRITIN" in the last 72  hours. '@lablastinr3'$ @ Studies/Results: No results found. Medications:  dextrose Stopped (11/24/21 1627)    allopurinol  100 mg Oral Daily   amLODipine  10 mg Oral Daily   atorvastatin  80 mg Oral Daily   bromocriptine  1.25 mg Oral Daily   calcium acetate  667 mg Oral Daily   Chlorhexidine Gluconate Cloth  6 each Topical Q0600   darbepoetin (ARANESP) injection - DIALYSIS  60 mcg Subcutaneous Q Wed-1800   heparin  5,000 Units Subcutaneous Q8H   hydrALAZINE  50 mg Oral BID   insulin aspart  0-6 Units Subcutaneous TID WC   isosorbide mononitrate  30 mg Oral Daily   metoprolol succinate  100 mg Oral Daily   multivitamin with minerals  1 tablet Oral Daily   pantoprazole  40 mg Oral Daily   sodium chloride flush  3 mL Intravenous Q12H     Dialysis Orders:  [facility] TTS 3.5h 350/A1.5x EDW 55.5kg 2K/2Ca AVF 16g  - Heparin 4000 units IV TIW - Mircera 50 mcg IV q 4 wks (last 9/30) - Calcitriol 1.0 mcg PO TIW    Last HD 10/26 Post wt 55.8kg    Assessment/Plan: Hypoglycemia/DM - with Covid infection/diarrhea/poor intake. Per primary team.   COVID+ - airborne precautions Hypokalemia-give KCL 20 MEQ X 1 dose. Follow labs ESRD -  HD TTS. Next HD 11/25/2021  Hypertension/volume  - BP/volume ok.  Continue home meds. UF to EDW Anemia  - Hb 8.5 Resume ESA with HD today.  Metabolic bone disease -  Continue Phoslo binder/Calcitriol  Nutrition - Encourage intake/renal diet +supp/fluid restriction   Disposition: Discharge order noted. DC home post HD.   [name] H. Simar Pothier NP-C 11/25/2021, 9:50 AM  [employer] [phone]

## 2021-11-25 NOTE — Progress Notes (Signed)
Mobility Specialist Progress Note   11/25/21 1045  Mobility  Activity Ambulated independently in room  Level of Assistance Modified independent, requires aide device or extra time  Assistive Device None  Distance Ambulated (ft) 180 ft  Activity Response Tolerated well  $Mobility charge 1 Mobility   Received pt in bed prepping for d/c having no complaints and agreeable. Pt was asymptomatic throughout ambulation while using furniture in room intermittently for comfort but no LOB reported. Returned to EOB w/o fault, pt awaiting d/c.  [name] Mobility Specialist Acute Rehab Office:  [phone]

## 2021-11-25 NOTE — Discharge Summary (Signed)
Physician Discharge Summary  [name] MEB:[id number] DOB: 05-Feb-1950 DOA: 11/22/2021  PCP: Erby Pian, PA-C  Admit date: 11/22/2021 Discharge date: 11/25/2021  Admitted From: Home Disposition:  Home  Recommendations for Outpatient Follow-up:  Follow up with PCP in 1-2 weeks Please obtain BMP/CBC in one week   Home Health:No Equipment/Devices:None  Discharge Condition:Stable CODE STATUS:Full Diet recommendation: Heart Healthy   Brief/Interim Summary:  71 y.o. 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 A1c is 5.2. Her oral hypoglycemic agents were discontinued. She will have to follow-up with her PCP as an outpatient and recheck an A1c in 3 months to see if she will need to be back on these medications.  COVID-19 viral infection: Mild symptoms of COVID diarrhea and anorexia probably contributing to hyperglycemia. Did not require oxygen.  End-stage renal disease on hemodialysis: She will continue on her regular dialysis to date.  Essential hypertension: No change made to her  medication.  Hyperlipidemia: Continue statins.  Chronic gout of the left knee: Continue current medication.  Discharge Instructions  Discharge Instructions     Diet - low sodium heart healthy   Complete by: As directed    Increase activity slowly   Complete by: As directed    No wound care   Complete by: As directed       Allergies as of 11/25/2021       Reactions   Tape Other (See Comments)   Paper tape rips skin.    Semaglutide Rash   Rybelsus 3 mg        Medication List     STOP taking these medications    repaglinide 2 MG tablet Commonly known as: PRANDIN   sitaGLIPtin 25 MG tablet Commonly known as: Januvia       TAKE these medications    acetaminophen 500 MG tablet Commonly known as: TYLENOL Take 1,000 mg by mouth every 6 (six) hours as needed for moderate pain.   allopurinol 100 MG tablet Commonly known as: ZYLOPRIM Take 1 tablet (100 mg total) by mouth daily.   amLODipine 10 MG tablet Commonly known as: NORVASC TAKE 1 TABLET BY MOUTH EVERY DAY   atorvastatin 80 MG tablet Commonly known as: LIPITOR TAKE 1 TABLET BY MOUTH EVERY DAY What changed:  how much to take how to take this when to take this additional instructions   bromocriptine 2.5 MG tablet Commonly known as: PARLODEL Take 0.5 tablets (1.25 mg total) by mouth daily.   Calcium Acetate 667 MG Tabs Take 1 tablet by mouth daily.   Dialyvite 800-Zinc 15 0.8 MG Tabs Take 1 tablet by mouth daily.   epoetin alfa-epbx 40000 UNIT/ML injection Commonly  known as: RETACRIT Inject 40,000 Units into the skin every 28 (twenty-eight) days.   hydrALAZINE 50 MG tablet Commonly known as: APRESOLINE Take 1 tablet (50 mg total) by mouth every 8 (eight) hours. What changed: when to take this   isosorbide mononitrate 30 MG 24 hr tablet Commonly known as: IMDUR Take 1 tablet (30 mg total) by mouth daily.   lidocaine-prilocaine cream Commonly known as: EMLA Apply 1 Application  topically See admin instructions. Three times a week   metoprolol succinate 100 MG 24 hr tablet Commonly known as: TOPROL-XL Take 100 mg by mouth daily.   MIRCERA IJ Inject 1 Syringe into the muscle See admin instructions. Given at dialysis   [facility] Lancets 98P Misc 1 each by Does not apply route 2 (two) times daily. E11.9   OneTouch Verio Flex System w/Device Kit 1 each by Does not apply route 2 (two) times daily. E11.9   OneTouch Verio test strip Generic drug: glucose blood USE AS DIRECTED TWICE DAILY   oxyCODONE-acetaminophen 5-325 MG tablet Commonly known as: Percocet Take 1 tablet by mouth every 6 (six) hours as needed for severe pain.   pantoprazole 40 MG tablet Commonly known as: PROTONIX Take 1 tablet (40 mg total) by mouth daily.   polyethylene glycol 17 g packet Commonly known as: MIRALAX / GLYCOLAX Take 34-51 g by mouth daily as needed for mild constipation.   Vitamin D (Ergocalciferol) 1.25 MG (50000 UNIT) Caps capsule Commonly known as: DRISDOL Take 50,000 Units by mouth once a week.               Durable Medical Equipment  (From admission, onward)           Start     Ordered   11/24/21 1259  For home use only DME Tub bench  Once        11/24/21 1258            Follow-up Information     Erby Pian, PA-C Follow up.   Specialty: Physician Assistant Contact information: [address] [place] [address] [phone]         [facility] Follow up.   Why: tub bench to be delivered to the room prior to d/c home. Contact information: [address] [place] [address] [phone]                Allergies  Allergen Reactions   Tape Other (See Comments)    Paper tape rips skin.    Semaglutide Rash    Rybelsus 3 mg    Consultations: None   Procedures/Studies: DG Chest 2 View  Result Date: 11/22/2021 CLINICAL DATA:  Cough, fever. EXAM: CHEST - 2 VIEW COMPARISON:   June 30, 2020. FINDINGS: Stable cardiomegaly. Minimal right midlung subsegmental atelectasis is noted. Probable peribronchial thickening is noted in left lower lobe. The visualized skeletal structures are unremarkable. IMPRESSION: Probable peribronchial thickening seen in left lower lobe concerning for bronchitis. This was present to some degree on the prior exam suggesting possible chronic bronchitis. Electronically Signed   By: Marijo Conception M.D.   On: 11/22/2021 12:59   (Echo, Carotid, EGD, Colonoscopy, ERCP)    Subjective: No complaints  Discharge Exam: Vitals:   11/24/21 2130 11/25/21 0744  BP: (!) 152/86 (!) 164/47  Pulse: (!) 58 (!) 58  Resp: 17 17  Temp: 98.5 F (36.9 C) 98.5 F (36.9 C)  SpO2: 100% 99%   Vitals:   11/24/21  7062 11/24/21 1627 11/24/21 2130 11/25/21 0744  BP:  (!) 129/57 (!) 152/86 (!) 164/47  Pulse: 64  (!) 58 (!) 58  Resp:  _0 Temp:  98.5 F (36.9 C) 98.5 F (36.9 C) 98.5 F (36.9 C)  TempSrc:  Oral Oral   SpO2:  98% 100% 99%  Weight:      Height:        General: Pt is alert, awake, not in acute distress Cardiovascular: RRR, S1/S2 +, no rubs, no gallops Respiratory: CTA bilaterally, no wheezing, no rhonchi Abdominal: Soft, NT, ND, bowel sounds + Extremities: no edema, no cyanosis    The results of significant diagnostics from this hospitalization (including imaging, microbiology, ancillary and laboratory) are listed below for reference.     Microbiology: Recent Results (from the past 240 hour(s))  SARS Coronavirus 2 by RT PCR (hospital order, performed in [facility] hospital lab) *cepheid single result test* Anterior Nasal Swab     Status: Abnormal   Collection Time: 11/22/21 11:39 AM   Specimen: Anterior Nasal Swab  Result Value Ref Range Status   SARS Coronavirus 2 by RT PCR POSITIVE (A) NEGATIVE Final    Comment: (NOTE) SARS-CoV-2 target nucleic acids are DETECTED  SARS-CoV-2 RNA is generally detectable in upper respiratory  specimens  during the acute phase of infection.  Positive results are indicative  of the presence of the identified virus, but do not rule out bacterial infection or co-infection with other pathogens not detected by the test.  Clinical correlation with patient history and  other diagnostic information is necessary to determine patient infection status.  The expected result is negative.  Fact Sheet for Patients:   https://www.patel.info/   Fact Sheet for Healthcare Providers:   https://hall.com/    This test is not yet approved or cleared by the [place] FDA and  has been authorized for detection and/or diagnosis of SARS-CoV-2 by FDA under an Emergency Use Authorization (EUA).  This EUA will remain in effect (meaning this test can be used) for the duration of  the COVID-19 declaration under Section 564(b)(1)  of the Act, 21 U.S.C. section 360-bbb-3(b)(1), unless the authorization is terminated or revoked sooner.   Performed at [facility], [place] [address], [place], [place] [address]      Labs: BNP (last 3 results) No results for input(s): "BNP" in the last 8760 hours. Basic Metabolic Panel: Recent Labs  Lab 11/22/21 1200 11/22/21 1539 11/23/21 0347 11/24/21 0519  NA 128* 127* 127* 135  K 3.7 3.6 3.8 3.2*  CL 93* 93* 93* 94*  CO2 _1 GLUCOSE 97 78 101* 81  BUN 50* 50* 50* 18  CREATININE 6.85* 6.69* 7.00* 3.56*  CALCIUM 9.0 8.7* 8.6* 9.0   Liver Function Tests: Recent Labs  Lab 11/22/21 1200  AST 50*  ALT 32  ALKPHOS 58  BILITOT 0.8  PROT 6.8  ALBUMIN 3.3*   No results for input(s): "LIPASE", "AMYLASE" in the last 168 hours. No results for input(s): "AMMONIA" in the last 168 hours. CBC: Recent Labs  Lab 11/22/21 1200 11/23/21 0347  WBC 6.2 5.7  NEUTROABS 4.7  --   HGB 10.6* 8.5*  HCT 31.1* 24.2*  MCV 101.3* 99.2  PLT 180 153   Cardiac Enzymes: No results for input(s): "CKTOTAL", "CKMB",  "CKMBINDEX", "TROPONINI" in the last 168 hours. BNP: Invalid input(s): "POCBNP" CBG: Recent Labs  Lab 11/24/21 1119 11/24/21 1625 11/24/21 2230 11/25/21 0339 11/25/21 0747  GLUCAP 94 96 206* 79 90   D-Dimer No results for input(s): "DDIMER" in the last 72 hours. Hgb A1c Recent Labs    11/23/21 0646  HGBA1C 5.2   Lipid Profile No results for input(s): "CHOL", "HDL", "LDLCALC", "TRIG", "CHOLHDL", "LDLDIRECT" in the last 72 hours. Thyroid function studies No results for input(s): "TSH", "T4TOTAL", "T3FREE", "THYROIDAB" in the last 72 hours.  Invalid input(s): "FREET3" Anemia work up No results for input(s): "VITAMINB12", "FOLATE", "FERRITIN", "TIBC", "IRON", "RETICCTPCT" in the last 72 hours. Urinalysis    Component Value Date/Time   COLORURINE STRAW (A) 06/30/2020 1955   APPEARANCEUR CLEAR 06/30/2020 1955   LABSPEC 1.008 06/30/2020 1955   PHURINE 8.0 06/30/2020 1955   GLUCOSEU NEGATIVE 06/30/2020 1955   GLUCOSEU NEGATIVE 09/04/2009 0721   HGBUR NEGATIVE 06/30/2020 1955   BILIRUBINUR NEGATIVE 06/30/2020 1955   BILIRUBINUR negative 09/24/2019 1003   KETONESUR NEGATIVE 06/30/2020 1955   PROTEINUR 30 (A) 06/30/2020 1955   UROBILINOGEN 0.2 09/24/2019 1003   UROBILINOGEN 0.2 09/04/2009 0721   NITRITE NEGATIVE 06/30/2020 1955   LEUKOCYTESUR NEGATIVE 06/30/2020 1955   Sepsis Labs Recent Labs  Lab 11/22/21 1200 11/23/21 0347  WBC 6.2 5.7   Microbiology Recent Results (from the past 240 hour(s))  SARS Coronavirus 2 by RT PCR (hospital order, performed in [facility] hospital lab) *cepheid single result test* Anterior Nasal Swab     Status: Abnormal   Collection Time: 11/22/21 11:39 AM   Specimen: Anterior Nasal Swab  Result Value Ref Range Status   SARS Coronavirus 2 by RT PCR POSITIVE (A) NEGATIVE Final    Comment: (NOTE) SARS-CoV-2 target nucleic acids are DETECTED  SARS-CoV-2 RNA is generally detectable in upper respiratory specimens  during the acute phase of  infection.  Positive results are indicative  of the presence of the identified virus, but do not rule out bacterial infection or co-infection with other pathogens not detected by the test.  Clinical correlation with patient history and  other diagnostic information is necessary to determine patient infection status.  The expected result is negative.  Fact Sheet for Patients:   https://www.patel.info/   Fact Sheet for Healthcare Providers:   https://hall.com/    This test is not yet approved or cleared by the [place] FDA and  has been authorized for detection and/or diagnosis of SARS-CoV-2 by FDA under an Emergency Use Authorization (EUA).  This EUA will remain in effect (meaning this test can be used) for the duration of  the COVID-19 declaration under Section 564(b)(1)  of the Act, 21 U.S.C. section 360-bbb-3(b)(1), unless the authorization is terminated or revoked sooner.   Performed at [facility], [place] [address] [place], [place] [address]      SIGNED:   Charlynne Cousins, MD  [facility] 11/25/2021, 9:50 AM Pager   If 7PM-7AM, please contact night-coverage [email or web address] Password [email or web address]

## 2021-11-25 NOTE — Progress Notes (Signed)
Pt to d/c to home today. Contacted attending and nephrology to inquire if pt appropriate for out-pt HD later today or tomorrow. Team agreeable to out-pt HD either day. Contacted [facility] and spoke to [facility]. Clinic is able to treat pt tomorrow at 11:45 arrival for 12:00 chair time. Spoke to pt via phone. Pt advised of appt for out-pt HD tomorrow and agreeable to plan. Appt added to AVS.   Melven Sartorius Renal Navigator [phone]

## 2021-12-20 ENCOUNTER — Telehealth: Payer: Self-pay | Admitting: Endocrinology

## 2021-12-20 NOTE — Telephone Encounter (Signed)
Patient is calling to say that she was in the hospital for 2 weeks and during that time the hospital took her off Repaglinide (prescribed by Dr. Loanne Drilling) and Sitagliptin (prescribed by Dr. Dwyane Dee).  Patient is calling to see if she needs to restart the medications.

## 2021-12-27 ENCOUNTER — Other Ambulatory Visit: Payer: Self-pay | Admitting: Physician Assistant

## 2021-12-27 DIAGNOSIS — Z1231 Encounter for screening mammogram for malignant neoplasm of breast: Secondary | ICD-10-CM

## 2022-01-24 ENCOUNTER — Other Ambulatory Visit: Payer: Self-pay | Admitting: Endocrinology

## 2022-01-28 ENCOUNTER — Other Ambulatory Visit (INDEPENDENT_AMBULATORY_CARE_PROVIDER_SITE_OTHER): Payer: Medicare Other

## 2022-01-28 DIAGNOSIS — E113599 Type 2 diabetes mellitus with proliferative diabetic retinopathy without macular edema, unspecified eye: Secondary | ICD-10-CM

## 2022-01-28 LAB — GLUCOSE, RANDOM: Glucose, Bld: 55 mg/dL — ABNORMAL LOW (ref 70–99)

## 2022-01-28 LAB — HEMOGLOBIN A1C: Hgb A1c MFr Bld: 4.9 % (ref 4.6–6.5)

## 2022-01-28 NOTE — Addendum Note (Signed)
Addended by: Nils Flack I on: 01/28/2022 02:32 PM   Modules accepted: Orders

## 2022-01-28 NOTE — Addendum Note (Signed)
Addended by: Nils Flack I on: 01/28/2022 12:01 PM   Modules accepted: Orders

## 2022-01-29 LAB — FRUCTOSAMINE: Fructosamine: 315 umol/L — ABNORMAL HIGH (ref 0–285)

## 2022-01-31 ENCOUNTER — Encounter: Payer: Self-pay | Admitting: Endocrinology

## 2022-01-31 ENCOUNTER — Ambulatory Visit (INDEPENDENT_AMBULATORY_CARE_PROVIDER_SITE_OTHER): Payer: Medicare Other | Admitting: Endocrinology

## 2022-01-31 VITALS — BP 142/62 | HR 92 | Ht 62.0 in | Wt 121.6 lb

## 2022-01-31 DIAGNOSIS — N184 Chronic kidney disease, stage 4 (severe): Secondary | ICD-10-CM

## 2022-01-31 DIAGNOSIS — E113599 Type 2 diabetes mellitus with proliferative diabetic retinopathy without macular edema, unspecified eye: Secondary | ICD-10-CM

## 2022-01-31 DIAGNOSIS — E1122 Type 2 diabetes mellitus with diabetic chronic kidney disease: Secondary | ICD-10-CM

## 2022-01-31 MED ORDER — ONETOUCH VERIO VI STRP: ORAL_STRIP | 3 refills | Status: AC

## 2022-01-31 NOTE — Progress Notes (Signed)
Patient ID: [name], female   DOB: 1950-04-04, 72 y.o.   MRN: [id number]           Reason for Appointment: Type II Diabetes follow-up   History of Present Illness   Diagnosis date: 2007  Previous history:  Not been able to continue metformin because of renal failure since 2020 She is instead been taking repaglinide 2 mg since 2021 and acarbose since 2022 In the past her A1c has ranged between 6.08.3 with the highest in 2021  Recent history:     Non-insulin hypoglycemic drugs: Januvia 25 mg daily and Prandin 2 mg Has not been on insulin            Side effects from medications: None  Current self management, blood sugar patterns and problems identified:  A1c is now only 4.9 although fructosamine is higher at 315 Her home blood sugars are being checked with a Verio monitor but this has test trips that are expired in 2021  She is mostly getting low blood sugars when she tests her blood sugars in the 50s and sometimes has symptoms with this Also her blood sugar in the lab has been in the 50s Appears to be losing weight and has lost 7 pounds  Her medications were not changed on her last visit  She continues to take Prandin with meals, generally twice a day  Exercise: Not able to do much because of knee pain   Diet management: She eats 2-3 meals a day with generally eggs, toast and meat in the morning and a protein and vegetables and a starch at lunch and dinner      Monitors blood glucose: Once a day.    Glucometer: One Touch.           Blood Glucose readings as above               Dietician consultations with dialysis nutritionist only     Weight control:  Wt Readings from Last 3 Encounters:  01/31/22 121 lb 9.6 oz (55.2 kg)  11/23/21 123 lb 7.3 oz (56 kg)  10/04/21 128 lb (58.1 kg)            Diabetes labs:  Lab Results  Component Value Date   HGBA1C 4.9 01/28/2022   HGBA1C 5.2 11/23/2021   HGBA1C 6.0 (A) 06/23/2021   Lab Results  Component Value  Date   MICROALBUR 12.8 (H) 08/28/2015   LDLCALC 80 07/11/2018   CREATININE 3.56 (H) 11/24/2021   Lab Results  Component Value Date   FRUCTOSAMINE 315 (H) 01/28/2022   FRUCTOSAMINE 294 (H) 09/29/2021   FRUCTOSAMINE 382 (H) 02/05/2021     Allergies as of 01/31/2022       Reactions   Tape Other (See Comments)   Paper tape rips skin.    Semaglutide Rash   Rybelsus 3 mg        Medication List        Accurate as of January 31, 2022  9:13 PM. If you have any questions, ask your nurse or doctor.          acetaminophen 500 MG tablet Commonly known as: TYLENOL Take 1,000 mg by mouth every 6 (six) hours as needed for moderate pain.   allopurinol 100 MG tablet Commonly known as: ZYLOPRIM Take 1 tablet (100 mg total) by mouth daily.   amLODipine 10 MG tablet Commonly known as: NORVASC TAKE 1 TABLET BY MOUTH EVERY DAY   atorvastatin 80 MG tablet Commonly  known as: LIPITOR TAKE 1 TABLET BY MOUTH EVERY DAY What changed:  how much to take how to take this when to take this additional instructions   blood glucose meter kit and supplies Kit Dispense based on patient and insurance preference. Use up to four times daily as directed.   bromocriptine 2.5 MG tablet Commonly known as: PARLODEL Take 0.5 tablets (1.25 mg total) by mouth daily.   Calcium Acetate 667 MG Tabs Take 1 tablet by mouth daily.   Dialyvite 800-Zinc 15 0.8 MG Tabs Take 1 tablet by mouth daily.   epoetin alfa-epbx 40000 UNIT/ML injection Commonly known as: RETACRIT Inject 40,000 Units into the skin every 28 (twenty-eight) days.   hydrALAZINE 50 MG tablet Commonly known as: APRESOLINE Take 1 tablet (50 mg total) by mouth every 8 (eight) hours. What changed: when to take this   isosorbide mononitrate 30 MG 24 hr tablet Commonly known as: IMDUR Take 1 tablet (30 mg total) by mouth daily.   Januvia 25 MG tablet Generic drug: sitaGLIPtin TAKE 1 TABLET (25 MG TOTAL) BY MOUTH DAILY.    lidocaine-prilocaine cream Commonly known as: EMLA Apply 1 Application topically See admin instructions. Three times a week   metoprolol succinate 100 MG 24 hr tablet Commonly known as: TOPROL-XL Take 100 mg by mouth daily.   MIRCERA IJ Inject 1 Syringe into the muscle See admin instructions. Given at dialysis   [facility] Lancets 53G Misc 1 each by Does not apply route 2 (two) times daily. E11.9   OneTouch Verio Flex System w/Device Kit 1 each by Does not apply route 2 (two) times daily. E11.9   OneTouch Verio test strip Generic drug: glucose blood USE AS DIRECTED TWICE DAILY   oxyCODONE-acetaminophen 5-325 MG tablet Commonly known as: Percocet Take 1 tablet by mouth every 6 (six) hours as needed for severe pain.   pantoprazole 40 MG tablet Commonly known as: PROTONIX Take 1 tablet (40 mg total) by mouth daily.   polyethylene glycol 17 g packet Commonly known as: MIRALAX / GLYCOLAX Take 34-51 g by mouth daily as needed for mild constipation.   Vitamin D (Ergocalciferol) 1.25 MG (50000 UNIT) Caps capsule Commonly known as: DRISDOL Take 50,000 Units by mouth once a week.        Allergies:  Allergies  Allergen Reactions   Tape Other (See Comments)    Paper tape rips skin.    Semaglutide Rash    Rybelsus 3 mg    Past Medical History:  Diagnosis Date   Arthritis    Chronic back pain    Constipation    Cough    Diabetes mellitus, type 2 ([place])    Diverticulosis    Fibroid    patient thinks this was the reason for her hysterectomy   GERD (gastroesophageal reflux disease)    Heart murmur    History of sebaceous cyst    Hyperlipemia    Hyperplastic colon polyp    Hypertension    Hyperthyroidism    Lumbar radiculopathy    Shortness of breath 09/13/2013   Stroke ([place]) 2015?   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    Past Surgical History:  Procedure Laterality Date   ABDOMINAL HYSTERECTOMY      --?ovaries remain   AV FISTULA PLACEMENT Left 07/16/2020   Procedure: LEFT BRACHIOCEPHALIC ARTERIOVENOUS (AV) FISTULA CREATION;  Surgeon: Cherre Robins, MD;  Location: [facility];  Service: Vascular;  Laterality: Left;  PERIPHERAL NERVE BLOCK   COLONOSCOPY W/ BIOPSIES  09/11/2008   ESOPHAGOGASTRODUODENOSCOPY (EGD) WITH PROPOFOL N/A 05/20/2020   Procedure: ESOPHAGOGASTRODUODENOSCOPY (EGD) WITH PROPOFOL;  Surgeon: Irene Shipper, MD;  Location: [facility] ENDOSCOPY;  Service: Endoscopy;  Laterality: N/A;   GIVENS CAPSULE STUDY N/A 06/28/2020   Procedure: GIVENS CAPSULE STUDY;  Surgeon: Irene Shipper, MD;  Location: [facility];  Service: Endoscopy;  Laterality: N/A;   RETINOPATHY SURGERY Bilateral 2013   Dr. Zigmund Daniel    Family History  Problem Relation Age of Onset   Hypertension Mother    Sleep apnea Mother    Diabetes Mother    Hyperlipidemia Mother    Lung cancer Father        smoker   Hypertension Sister    Diabetes Sister    Hypertension Brother    Hypertension Sister    Diabetes Brother    Hypertension Brother    Breast cancer Neg Hx    Colon cancer Neg Hx    Esophageal cancer Neg Hx    Pancreatic cancer Neg Hx    Liver disease Neg Hx    Colon polyps Neg Hx     Social History:  reports that she has been smoking cigarettes. She has a 17.50 pack-year smoking history. She has never used smokeless tobacco. She reports that she does not drink alcohol and does not use drugs.  Review of Systems:  Last diabetic eye exam date Dr Madelyn Brunner  Last foot exam date:6/22  Hypertension: Managed by nephrologist  BP Readings from Last 3 Encounters:  01/31/22 (!) 142/62  11/25/21 (!) 164/47  10/04/21 (!) 122/50   She has been on dialysis for ESRD since 10/22  Lipids:  followed by PCP on atorvastatin 80 mg No history of coronary artery disease, may have PVD  LDL is last 69 from her PCP    Lab Results  Component Value Date   CHOL 155 07/11/2018   CHOL 157 07/10/2017   CHOL 141  01/11/2016   Lab Results  Component Value Date   HDL 44.70 07/11/2018   HDL 39.40 07/10/2017   HDL 38.40 (L) 01/11/2016   Lab Results  Component Value Date   LDLCALC 80 07/11/2018   [place] 84 01/11/2016   [place] 75 01/15/2015   Lab Results  Component Value Date   TRIG 151.0 (H) 07/11/2018   TRIG 270.0 (H) 07/10/2017   TRIG 95.0 01/11/2016   Lab Results  Component Value Date   CHOLHDL 3 07/11/2018   CHOLHDL 4 07/10/2017   CHOLHDL 4 01/11/2016   Lab Results  Component Value Date   LDLDIRECT 69.0 09/29/2021   LDLDIRECT 79.0 07/10/2017   LDLDIRECT 130.4 12/13/2012    No history of thyroid dysfunction but in 2018 had a 1.8 cm right thyroid nodule which was benign on biopsy  Lab Results  Component Value Date   TSH 1.83 09/24/2019   TSH 1.854 09/09/2017   TSH 1.56 09/08/2017     Examination:   BP (!) 142/62   Pulse 92   Ht '5\' 2"'$  (1.575 m)   Wt 121 lb 9.6 oz (55.2 kg)   SpO2 99%   BMI 22.24 kg/m   Body mass index is 22.24 kg/m.    ASSESSMENT/ PLAN:    Diabetes type 2 non-insulin-dependent:   Current regimen:  Prandin and Januvia  Her A1c has been around 6% previously and now 4.9  Likely because of her decreased appetite, weight loss and significant renal  failure her blood sugars appear to be getting low with Prandin Although fructosamine is relatively high most of her blood sugars appear to be relatively low and her monitoring is unreliable because of using expired test trips  For now she will stop her Prandin completely and only continue her 25 mg Januvia which is appropriate for her renal function She will be given new meter and test strips and let us know if she has any tendency to hypoglycemia again Advised her to check her blood sugars mostly after meals or when she is symptomatic and occasionally fasting  Patient Instructions  Check blood sugars on waking up 1-2 days a week  Also check blood sugars about 2 hours after meals and do this after  different meals by rotation  Recommended blood sugar levels on waking up are 90-120 and about 2 hours after meal is 130-160  Please bring your blood sugar monitor to each visit, thank you  Call if sugar >200   Elayne Snare 01/31/2022, 9:13 PM

## 2022-01-31 NOTE — Patient Instructions (Signed)
Check blood sugars on waking up 1-2 days a week  Also check blood sugars about 2 hours after meals and do this after different meals by rotation  Recommended blood sugar levels on waking up are 90-120 and about 2 hours after meal is 130-160  Please bring your blood sugar monitor to each visit, thank you  Call if sugar >200

## 2022-02-08 ENCOUNTER — Telehealth: Payer: Self-pay

## 2022-02-08 NOTE — Telephone Encounter (Signed)
Elmyra Ricks has been notified

## 2022-02-08 NOTE — Telephone Encounter (Signed)
Johnanna Schneiders PA from [facility] is calling to find out if patient is suppose to be on Bromocriptine.  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  Please advise  [phone]

## 2022-02-14 ENCOUNTER — Other Ambulatory Visit (HOSPITAL_COMMUNITY)
Admission: RE | Admit: 2022-02-14 | Discharge: 2022-02-14 | Disposition: A | Discharge status: Home / Self Care | Payer: Medicare Other | Source: Ambulatory Visit | Attending: General Practice | Admitting: General Practice

## 2022-02-14 DIAGNOSIS — E1122 Type 2 diabetes mellitus with diabetic chronic kidney disease: Secondary | ICD-10-CM | POA: Diagnosis present

## 2022-02-14 DIAGNOSIS — N184 Chronic kidney disease, stage 4 (severe): Secondary | ICD-10-CM | POA: Diagnosis not present

## 2022-02-14 LAB — CBC WITH DIFFERENTIAL/PLATELET
Abs Immature Granulocytes: 0.08 10*3/uL — ABNORMAL HIGH (ref 0.00–0.07)
Basophils Absolute: 0 10*3/uL (ref 0.0–0.1)
Basophils Relative: 0 %
Eosinophils Absolute: 0.2 10*3/uL (ref 0.0–0.5)
Eosinophils Relative: 3 %
HCT: 30.9 % — ABNORMAL LOW (ref 36.0–46.0)
Hemoglobin: 10.2 g/dL — ABNORMAL LOW (ref 12.0–15.0)
Immature Granulocytes: 1 %
Lymphocytes Relative: 5 %
Lymphs Abs: 0.4 10*3/uL — ABNORMAL LOW (ref 0.7–4.0)
MCH: 32.5 pg (ref 26.0–34.0)
MCHC: 33 g/dL (ref 30.0–36.0)
MCV: 98.4 fL (ref 80.0–100.0)
Monocytes Absolute: 0.6 10*3/uL (ref 0.1–1.0)
Monocytes Relative: 8 %
Neutro Abs: 6.1 10*3/uL (ref 1.7–7.7)
Neutrophils Relative %: 83 %
Platelets: 167 10*3/uL (ref 150–400)
RBC: 3.14 MIL/uL — ABNORMAL LOW (ref 3.87–5.11)
RDW: 13.3 % (ref 11.5–15.5)
WBC: 7.3 10*3/uL (ref 4.0–10.5)
nRBC: 0 % (ref 0.0–0.2)

## 2022-02-14 LAB — COMPREHENSIVE METABOLIC PANEL
ALT: 55 U/L — ABNORMAL HIGH (ref 0–44)
AST: 36 U/L (ref 15–41)
Albumin: 3.4 g/dL — ABNORMAL LOW (ref 3.5–5.0)
Alkaline Phosphatase: 71 U/L (ref 38–126)
Anion gap: 8 (ref 5–15)
BUN: 17 mg/dL (ref 8–23)
CO2: 25 mmol/L (ref 22–32)
Calcium: 9 mg/dL (ref 8.9–10.3)
Chloride: 108 mmol/L (ref 98–111)
Creatinine, Ser: 0.86 mg/dL (ref 0.44–1.00)
GFR, Estimated: >60 mL/min (ref >60)
Glucose, Bld: 124 mg/dL — ABNORMAL HIGH (ref 70–99)
Potassium: 4.1 mmol/L (ref 3.5–5.1)
Sodium: 141 mmol/L (ref 135–145)
Total Bilirubin: 0.7 mg/dL (ref 0.3–1.2)
Total Protein: 6.4 g/dL — ABNORMAL LOW (ref 6.5–8.1)

## 2022-02-14 LAB — PHOSPHORUS: Phosphorus: 1.3 mg/dL — ABNORMAL LOW (ref 2.5–4.6)

## 2022-02-14 LAB — MAGNESIUM: Magnesium: 1.6 mg/dL — ABNORMAL LOW (ref 1.7–2.4)

## 2022-02-15 ENCOUNTER — Ambulatory Visit: Payer: Medicare Other | Admitting: Podiatry

## 2022-02-15 LAB — BK QUANT PCR (PLASMA/SERUM): BK Quantitaion PCR: NEGATIVE IU/mL

## 2022-02-15 LAB — TACROLIMUS LEVEL: Tacrolimus (FK506) - LabCorp: 4.3 ng/mL (ref 2.0–20.0)

## 2022-02-18 ENCOUNTER — Ambulatory Visit
Admission: RE | Admit: 2022-02-18 | Discharge: 2022-02-18 | Disposition: A | Discharge status: Home / Self Care | Payer: Medicare Other | Source: Ambulatory Visit | Attending: Physician Assistant | Admitting: Physician Assistant

## 2022-02-18 ENCOUNTER — Ambulatory Visit: Payer: Medicare Other | Admitting: Internal Medicine

## 2022-02-18 DIAGNOSIS — Z1231 Encounter for screening mammogram for malignant neoplasm of breast: Secondary | ICD-10-CM

## 2022-02-18 HISTORY — DX: Chronic kidney disease, unspecified: N18.9

## 2022-02-21 ENCOUNTER — Other Ambulatory Visit (HOSPITAL_COMMUNITY)
Admission: AD | Admit: 2022-02-21 | Discharge: 2022-02-21 | Disposition: A | Discharge status: Home / Self Care | Payer: Medicare Other | Source: Ambulatory Visit

## 2022-02-21 DIAGNOSIS — Z029 Encounter for administrative examinations, unspecified: Secondary | ICD-10-CM | POA: Insufficient documentation

## 2022-02-21 LAB — MAGNESIUM: Magnesium: 1.7 mg/dL (ref 1.7–2.4)

## 2022-02-21 LAB — CBC WITH DIFFERENTIAL/PLATELET
Abs Immature Granulocytes: 0.06 10*3/uL (ref 0.00–0.07)
Basophils Absolute: 0.1 10*3/uL (ref 0.0–0.1)
Basophils Relative: 1 %
Eosinophils Absolute: 0.1 10*3/uL (ref 0.0–0.5)
Eosinophils Relative: 2 %
HCT: 29 % — ABNORMAL LOW (ref 36.0–46.0)
Hemoglobin: 10.1 g/dL — ABNORMAL LOW (ref 12.0–15.0)
Immature Granulocytes: 1 %
Lymphocytes Relative: 8 %
Lymphs Abs: 0.6 10*3/uL — ABNORMAL LOW (ref 0.7–4.0)
MCH: 34.2 pg — ABNORMAL HIGH (ref 26.0–34.0)
MCHC: 34.8 g/dL (ref 30.0–36.0)
MCV: 98.3 fL (ref 80.0–100.0)
Monocytes Absolute: 0.6 10*3/uL (ref 0.1–1.0)
Monocytes Relative: 8 %
Neutro Abs: 6 10*3/uL (ref 1.7–7.7)
Neutrophils Relative %: 80 %
Platelets: 250 10*3/uL (ref 150–400)
RBC: 2.95 MIL/uL — ABNORMAL LOW (ref 3.87–5.11)
RDW: 14 % (ref 11.5–15.5)
WBC: 7.5 10*3/uL (ref 4.0–10.5)
nRBC: 0 % (ref 0.0–0.2)

## 2022-02-21 LAB — COMPREHENSIVE METABOLIC PANEL
ALT: 36 U/L (ref 0–44)
AST: 22 U/L (ref 15–41)
Albumin: 3.4 g/dL — ABNORMAL LOW (ref 3.5–5.0)
Alkaline Phosphatase: 99 U/L (ref 38–126)
Anion gap: 12 (ref 5–15)
BUN: 20 mg/dL (ref 8–23)
CO2: 22 mmol/L (ref 22–32)
Calcium: 9.4 mg/dL (ref 8.9–10.3)
Chloride: 102 mmol/L (ref 98–111)
Creatinine, Ser: 1.15 mg/dL — ABNORMAL HIGH (ref 0.44–1.00)
GFR, Estimated: 51 mL/min — ABNORMAL LOW (ref >60)
Glucose, Bld: 183 mg/dL — ABNORMAL HIGH (ref 70–99)
Potassium: 4 mmol/L (ref 3.5–5.1)
Sodium: 136 mmol/L (ref 135–145)
Total Bilirubin: 0.5 mg/dL (ref 0.3–1.2)
Total Protein: 6.4 g/dL — ABNORMAL LOW (ref 6.5–8.1)

## 2022-02-21 LAB — PHOSPHORUS: Phosphorus: 1.9 mg/dL — ABNORMAL LOW (ref 2.5–4.6)

## 2022-02-21 NOTE — Progress Notes (Unsigned)
Cardiology Clinic Note   Patient Name: [name] Date of Encounter: 02/22/2022  Primary Care Provider:  Erby Pian, PA-C Primary Cardiologist:  Pixie Casino, MD  Patient Profile    [name] 72 year old female presents the clinic today for follow-up evaluation of her hypertension and acute on chronic diastolic CHF.   Past Medical History    Past Medical History:  Diagnosis Date   Arthritis    Chronic back pain    Chronic kidney disease (CKD)    Constipation    Cough    Diabetes mellitus, type 2 ([place])    Diverticulosis    Fibroid    patient thinks this was the reason for her hysterectomy   GERD (gastroesophageal reflux disease)    Heart murmur    History of sebaceous cyst    Hyperlipemia    Hyperplastic colon polyp    Hypertension    Hyperthyroidism    Lumbar radiculopathy    Shortness of breath 09/13/2013   Stroke ([place]) 2015?   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   Past Surgical History:  Procedure Laterality Date   ABDOMINAL HYSTERECTOMY     --?ovaries remain   AV FISTULA PLACEMENT Left 07/16/2020   Procedure: LEFT BRACHIOCEPHALIC ARTERIOVENOUS (AV) FISTULA CREATION;  Surgeon: Cherre Robins, MD;  Location: [place];  Service: Vascular;  Laterality: Left;  PERIPHERAL NERVE BLOCK   COLONOSCOPY W/ BIOPSIES  09/11/2008   ESOPHAGOGASTRODUODENOSCOPY (EGD) WITH PROPOFOL N/A 05/20/2020   Procedure: ESOPHAGOGASTRODUODENOSCOPY (EGD) WITH PROPOFOL;  Surgeon: Irene Shipper, MD;  Location: [place];  Service: Endoscopy;  Laterality: N/A;   GIVENS CAPSULE STUDY N/A 06/28/2020   Procedure: GIVENS CAPSULE STUDY;  Surgeon: Irene Shipper, MD;  Location: [place];  Service: Endoscopy;  Laterality: N/A;   RETINOPATHY SURGERY Bilateral 2013   Dr. Zigmund Daniel    Allergies  Allergies  Allergen Reactions   Tape Other (See Comments)    Paper tape rips skin.    Semaglutide Rash     Rybelsus 3 mg    History of Present Illness    [name] has a PMH of type II diabetes melitis, hypertension, CVA, acute on chronic diastolic CHF, GERD, CKD stage IV, tobacco abuse, HLD, systolic murmur, vitamin D deficiency, and anemia secondary to GI bleed.  She was hospitalized April 25-29 and then May 11-18 with diagnosis of symptomatic anemia possibly related to GI bleeding and again with CHF.  She underwent EGD and colonoscopy which showed no source of bleeding.  She returned to the emergency department 06/27/2020 with shortness of breath, weakness, and hemoglobin of 5.2.  She had a hemoglobin of 8.9 at discharge on May 18.  She denied melena and bright red rectal bleeding.  She reported having taken aspirin 325 daily and Aleve 2 days prior to her admission.  She received 2 units of PRBCs.  She underwent capsule endoscopy which was pending at the time of her discharge.  Her aspirin and NSAID were held.  She was not noted to have bleeding during her hospital stay.  She remained stable and was discharged 06/29/2020.   She was last seen by cardiology on 06/08/2020 mild been in the hospital.  She was treated for acute on chronic diastolic CHF.  Her echocardiogram showed hypertensive heart disease.  There was low suspicion for cardiac amyloid.  Her troponin levels were minimally elevated which was  felt to be related to diastolic heart failure and hypertensive crisis.  She was also noted to have mild aortic stenosis.  Outpatient follow-up was planned however unfortunately, she was readmitted to the hospital.  Of note work-up for amyloid was discussed but deferred to Dr. Lysbeth Penner recommendations.   She presented in the clinic 06/30/20 for follow-up evaluation stated she felt well.  We reviewed her endoscopy report and her echocardiogram.  She was discharged from the hospital 06/29/2020.  We reviewed heart healthy low-sodium diet and the importance of daily weights.  I gave her the salty 6 diet sheet, had her  increase her physical activity as tolerated, and planned follow-up in 3 months.  We reviewed her occult stool which was positive and I instructed her to follow-up with GI.  She was seen in follow-up by her [facility] on 11/23/2020.  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  It was not felt that she needed workup for cardiac amyloid.  Her blood pressure was well-controlled at 128/60.  She was on HD.  She was noted to have a left arm fistula that had recently been revised.  She denied chest pain and worsening shortness of breath.  She presents to the clinic today for follow-up evaluation and states she feels well today.  She underwent kidney transplant surgery on 02/07/2022.  She reports that she is recovering well.  She has been driving to [employer] regularly for blood work.  She is no longer in need of HD.  Her blood pressure initially today is 160/60 and on recheck is 138/58.  She is now taking hydralazine, metoprolol, nifedipine, and is on prednisone.  We reviewed the importance of slowly increasing physical activity, heart healthy low-sodium diet, and adhering to her current medication regimen.  She expressed understanding.   Today she denies chest pain, shortness of breath, lower extremity edema, fatigue, palpitations, melena, hematuria, hemoptysis, diaphoresis, weakness, presyncope, syncope, orthopnea, and PND.   Home Medications    Prior to Admission medications   Medication Sig Start Date End Date Taking? Authorizing Provider  acetaminophen (TYLENOL) 500 MG tablet Take 1,000 mg by mouth every 6 (six) hours as needed for moderate pain.    [provider]  allopurinol (ZYLOPRIM) 100 MG tablet Take 1 tablet (100 mg total) by mouth daily. 05/23/20   Ghimire, Henreitta Leber, MD  amLODipine (NORVASC) 10 MG tablet TAKE 1 TABLET BY MOUTH EVERY DAY Patient taking differently: Take 10 mg by mouth daily. 03/16/20   Marrian Salvage, FNP   atorvastatin (LIPITOR) 80 MG tablet TAKE 1 TABLET BY MOUTH EVERY DAY Patient taking differently: Take 80 mg by mouth daily. 03/30/20   Marrian Salvage, FNP  B Complex-C-Zn-Folic Acid (DIALYVITE 665-LDJT 15) 0.8 MG TABS Take 1 tablet by mouth daily. 01/28/21   [provider]  blood glucose meter kit and supplies KIT Dispense based on patient and insurance preference. Use up to four times daily as directed. 11/25/21   Charlynne Cousins, MD  Blood Glucose Monitoring Suppl ([place]) w/Device KIT 1 each by Does not apply route 2 (two) times daily. E11.9    [provider]  bromocriptine (PARLODEL) 2.5 MG tablet Take 0.5 tablets (1.25 mg total) by mouth daily. 02/24/20   Renato Shin, MD  Calcium Acetate 667 MG TABS Take 1 tablet by mouth daily.    [provider]  epoetin alfa-epbx (RETACRIT) 70177 UNIT/ML injection Inject 40,000 Units into the skin every 28 (  twenty-eight) days.    [provider]  glucose blood (ONETOUCH VERIO) test strip USE AS DIRECTED TWICE DAILY 01/31/22   Elayne Snare, MD  hydrALAZINE (APRESOLINE) 50 MG tablet Take 1 tablet (50 mg total) by mouth every 8 (eight) hours. Patient taking differently: Take 50 mg by mouth 2 (two) times daily. 06/10/20 11/22/21  Dessa Phi, DO  isosorbide mononitrate (IMDUR) 30 MG 24 hr tablet Take 1 tablet (30 mg total) by mouth daily. 06/11/20   Dessa Phi, DO  JANUVIA 25 MG tablet TAKE 1 TABLET (25 MG TOTAL) BY MOUTH DAILY. 01/24/22   Elayne Snare, MD  lidocaine-prilocaine (EMLA) cream Apply 1 Application topically See admin instructions. Three times a week 02/05/21   [provider]  Methoxy PEG-Epoetin Beta (MIRCERA IJ) Inject 1 Syringe into the muscle See admin instructions. Given at dialysis 01/14/21 03/01/22  [provider]  metoprolol succinate (TOPROL-XL) 100 MG 24 hr tablet Take 100 mg by mouth daily.    [provider]  OneTouch Delica Lancets 48O MISC 1  each by Does not apply route 2 (two) times daily. E11.9    [provider]  oxyCODONE-acetaminophen (PERCOCET) 5-325 MG tablet Take 1 tablet by mouth every 6 (six) hours as needed for severe pain. 07/16/20   Rhyne, Hulen Shouts, PA-C  pantoprazole (PROTONIX) 40 MG tablet Take 1 tablet (40 mg total) by mouth daily. 06/12/19   Marrian Salvage, FNP  polyethylene glycol (MIRALAX / GLYCOLAX) 17 g packet Take 34-51 g by mouth daily as needed for mild constipation.    [provider]  Vitamin D, Ergocalciferol, (DRISDOL) 1.25 MG (50000 UNIT) CAPS capsule Take 50,000 Units by mouth once a week. 02/18/21   [provider]    Family History    Family History  Problem Relation Age of Onset   Hypertension Mother    Sleep apnea Mother    Diabetes Mother    Hyperlipidemia Mother    Lung cancer Father        smoker   Hypertension Sister    Diabetes Sister    Hypertension Brother    Hypertension Sister    Diabetes Brother    Hypertension Brother    Breast cancer Neg Hx    Colon cancer Neg Hx    Esophageal cancer Neg Hx    Pancreatic cancer Neg Hx    Liver disease Neg Hx    Colon polyps Neg Hx    She indicated that her mother is deceased. She indicated that her father is deceased. She indicated that both of her sisters are alive. She indicated that both of her brothers are alive. She indicated that the status of her neg hx is unknown.  Social History    Social History   Socioeconomic History   Marital status: Divorced    Spouse name: Not on file   Number of children: 1   Years of education: 11   Highest education level: Not on file  Occupational History   Occupation: Insurance account manager: [employer]    Comment: Retired  Tobacco Use   Smoking status: Some Days    Packs/day: 0.50    Years: 35.00    Total pack years: 17.50    Types: Cigarettes    Last attempt to quit: 07/07/2014    Years since quitting: 7.6   Smokeless tobacco: Never    Tobacco comments:    Smoking some again    02/12/2022 patient smokes 1 -2 some days  Vaping Use   Vaping Use: Never used  Substance and Sexual Activity   Alcohol use: No   Drug use: No   Sexual activity: Never    Partners: Male    Birth control/protection: Surgical    Comment: TAH--?ovaries remain  Other Topics Concern   Not on file  Social History Narrative   Divorced 1 son    Retired Museum/gallery curator for [employer]   Former smoker no alcohol caffeine or drug use report denies abuse and feels safe at home.   Social Determinants of Health   Financial Resource Strain: Medium Risk (08/23/2019)   Overall Financial Resource Strain (CARDIA)    Difficulty of Paying Living Expenses: Somewhat hard  Food Insecurity: Food Insecurity Present (11/23/2021)   Hunger Vital Sign    Worried About Running Out of Food in the Last Year: Often true    Ran Out of Food in the Last Year: Sometimes true  Transportation Needs: No Transportation Needs (11/23/2021)   PRAPARE - Hydrologist (Medical): No    Lack of Transportation (Non-Medical): No  Physical Activity: Sufficiently Active (08/23/2019)   Exercise Vital Sign    Days of Exercise per Week: 5 days    Minutes of Exercise per Session: 30 min  Stress: No Stress Concern Present (08/23/2019)   [place]    Feeling of Stress : Not at all  Social Connections: Moderately Integrated (07/07/2017)   Social Connection and Isolation Panel [NHANES]    Frequency of Communication with Friends and Family: More than three times a week    Frequency of Social Gatherings with Friends and Family: More than three times a week    Attends Religious Services: More than 4 times per year    Active Member of [employer] or Organizations: Yes    Attends Music therapist: More than 4 times per year    Marital Status: Divorced  Intimate Partner Violence: Not At Risk  (11/23/2021)   Humiliation, Afraid, Rape, and Kick questionnaire    Fear of Current or Ex-Partner: No    Emotionally Abused: No    Physically Abused: No    Sexually Abused: No     Review of Systems    General:  No chills, fever, night sweats or weight changes.  Cardiovascular:  No chest pain, dyspnea on exertion, edema, orthopnea, palpitations, paroxysmal nocturnal dyspnea. Dermatological: No rash, lesions/masses Respiratory: No cough, dyspnea Urologic: No hematuria, dysuria Abdominal:   No nausea, vomiting, diarrhea, bright red blood per rectum, melena, or hematemesis Neurologic:  No visual changes, wkns, changes in mental status. All other systems reviewed and are otherwise negative except as noted above.  Physical Exam    VS:  BP (!) 138/58   Pulse 69   Ht '5\' 2"'$  (1.575 m)   Wt 122 lb 3.2 oz (55.4 kg)   SpO2 99%   BMI 22.35 kg/m  , BMI Body mass index is 22.35 kg/m. GEN: Well nourished, well developed, in no acute distress. HEENT: normal. Neck: Supple, no JVD, carotid bruits, or masses. Cardiac: RRR, 3/6 systolic murmur heard along right sternal border, rubs, or gallops. No clubbing, cyanosis, edema.  Radials/DP/PT 2+ and equal bilaterally.  Respiratory:  Respirations regular and unlabored, clear to auscultation bilaterally. GI: Soft, nontender, nondistended, BS + x 4. MS: no deformity or atrophy. Skin: warm and dry, no rash. Neuro:  Strength and sensation are intact. Psych: Normal affect.  Accessory Clinical Findings  Recent Labs: 02/21/2022: ALT 36; BUN 20; Creatinine, Ser 1.15; Hemoglobin 10.1; Magnesium 1.7; Platelets 250; Potassium 4.0; Sodium 136   Recent Lipid Panel    Component Value Date/Time   CHOL 155 07/11/2018 1258   TRIG 151.0 (H) 07/11/2018 1258   HDL 44.70 07/11/2018 1258   CHOLHDL 3 07/11/2018 1258   VLDL 30.2 07/11/2018 1258   LDLCALC 80 07/11/2018 1258   LDLDIRECT 69.0 09/29/2021 1534         ECG personally reviewed by me today-none  today.  Echocardiogram 06/03/2020 IMPRESSIONS     1. Left ventricular ejection fraction, by estimation, is 70 to 75%. The  left ventricle has hyperdynamic function. The left ventricle has no  regional wall motion abnormalities. There is severe left ventricular  hypertrophy. Left ventricular diastolic  parameters are consistent with Grade II diastolic dysfunction  (pseudonormalization). Elevated left atrial pressure.   2. Right ventricular systolic function is normal. The right ventricular  size is normal. Tricuspid regurgitation signal is inadequate for assessing  PA pressure.   3. Left atrial size was severely dilated.   4. The mitral valve is normal in structure. Trivial mitral valve  regurgitation.   5. The aortic valve is tricuspid. Aortic valve regurgitation is trivial.  Mild aortic valve stenosis. Vmax 2.5 m/s, MG 15 mmHg, AVA 1.5 cm^2, DI  0.65   6. 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 Recommend PYP scan to evaluate for TTR amyloid and  SPEP/UPEP/light chains to evaluate for AL amyloid   Assessment & Plan   1.  Acute on chronic diastolic CHF-breathing stable.  Euvolemic today.  Weight today 122.  Echocardiogram 06/03/2020 showed an EF of 70-75%, severe left ventricular hypertrophy, G2 DD elevated left atrial pressures, severely dilated left atria, trivial mitral valve regurgitation, trivial aortic valve regurgitation.  There was low suspicion for cardiac amyloidosis.   Continue metoprolol, hydralizine Heart healthy low-sodium diet-salty 6 reviewed Increase physical activity as tolerated   Mild aortic stenosis- no increased DOE or activity intolerance.  Noted on 06/03/2020 echocardiogram.  3/6 systolic murmur heard along right sternal border. Continue metoprolol Heart healthy low-sodium diet.   Increase physical activity as tolerated Repeat echocardiogram when clinically indicated   Essential hypertension-BP today 138/58.   Well-controlled at home.  Previously underwent renal Dopplers that showed no renal artery stenosis.  Also had plasma renin restaurant ratio performed which was negative. Continue hydralazine,metoprolol Follows with nephrology   Hyperlipidemia-continue atorvastatin Heart healthy low-sodium high-fiber diet-encouraged Increase physical activity as tolerated Follows with PCP   Renal transplant-underwent renal transplant on 02/07/2022.  Recovering well postoperatively.  Has follow-up appointment with [facility] tomorrow. Continue current medical therapy   Disposition: Follow-up with Dr. Debara Pickett in 4-6 months.   Jossie Ng. Fate Galanti NP-C     02/22/2022, 3:08 PM [place] [address] Office [phone] Fax [phone]    I spent 14 minutes examining this patient, reviewing medications, and using patient centered shared decision making involving her cardiac care.  Prior to her visit I spent greater than 20 minutes reviewing her past medical history,  medications, and prior cardiac tests.

## 2022-02-22 ENCOUNTER — Ambulatory Visit: Payer: Medicare Other | Attending: Internal Medicine | Admitting: General Practice

## 2022-02-22 ENCOUNTER — Encounter: Payer: Self-pay | Admitting: General Practice

## 2022-02-22 VITALS — BP 138/58 | HR 69 | Ht 62.0 in | Wt 122.2 lb

## 2022-02-22 DIAGNOSIS — I5033 Acute on chronic diastolic (congestive) heart failure: Secondary | ICD-10-CM | POA: Diagnosis not present

## 2022-02-22 DIAGNOSIS — I350 Nonrheumatic aortic (valve) stenosis: Secondary | ICD-10-CM | POA: Diagnosis not present

## 2022-02-22 DIAGNOSIS — E785 Hyperlipidemia, unspecified: Secondary | ICD-10-CM | POA: Diagnosis not present

## 2022-02-22 DIAGNOSIS — I10 Essential (primary) hypertension: Secondary | ICD-10-CM | POA: Diagnosis not present

## 2022-02-22 LAB — BK QUANT PCR (PLASMA/SERUM): BK Quantitaion PCR: NEGATIVE IU/mL

## 2022-02-22 LAB — TACROLIMUS LEVEL: Tacrolimus (FK506) - LabCorp: 8.6 ng/mL (ref 2.0–20.0)

## 2022-02-22 NOTE — Patient Instructions (Addendum)
Medication Instructions:  The current medical regimen is effective;  continue present plan and medications as directed. Please refer to the Current Medication list given to you today.  *If you need a refill on your cardiac medications before your next appointment, please call your pharmacy*  Lab Work: NONE If you have labs (blood work) drawn today and your tests are completely normal, you will receive your results only by: [place] (if you have MyChart) OR A paper copy in the mail If you have any lab test that is abnormal or we need to change your treatment, we will call you to review the results.  Testing/Procedures: NONE   Follow-Up: At [facility], you and your health needs are our priority.  As part of our continuing mission to provide you with exceptional heart care, we have created designated Provider Care Teams.  These Care Teams include your primary Cardiologist (physician) and Advanced Practice Providers (APPs -  Physician Assistants and Nurse Practitioners) who all work together to provide you with the care you need, when you need it.  Your next appointment:   4-6 month(s)  Provider:   Pixie Casino, MD     Other Instructions

## 2022-02-23 ENCOUNTER — Ambulatory Visit: Payer: Medicare Other | Admitting: Podiatry

## 2022-02-25 ENCOUNTER — Ambulatory Visit: Payer: Medicare Other | Admitting: Podiatry

## 2022-02-28 ENCOUNTER — Other Ambulatory Visit (HOSPITAL_COMMUNITY)
Admission: RE | Admit: 2022-02-28 | Discharge: 2022-02-28 | Disposition: A | Discharge status: Home / Self Care | Payer: Medicare Other | Source: Ambulatory Visit | Attending: Podiatry | Admitting: Podiatry

## 2022-02-28 ENCOUNTER — Other Ambulatory Visit (HOSPITAL_COMMUNITY): Admit: 2022-02-28 | Payer: Medicare Other

## 2022-02-28 DIAGNOSIS — E1151 Type 2 diabetes mellitus with diabetic peripheral angiopathy without gangrene: Secondary | ICD-10-CM | POA: Diagnosis not present

## 2022-02-28 DIAGNOSIS — B351 Tinea unguium: Secondary | ICD-10-CM | POA: Insufficient documentation

## 2022-02-28 DIAGNOSIS — M79674 Pain in right toe(s): Secondary | ICD-10-CM | POA: Diagnosis not present

## 2022-02-28 DIAGNOSIS — Z940 Kidney transplant status: Secondary | ICD-10-CM | POA: Insufficient documentation

## 2022-02-28 DIAGNOSIS — M79675 Pain in left toe(s): Secondary | ICD-10-CM | POA: Insufficient documentation

## 2022-02-28 LAB — COMPREHENSIVE METABOLIC PANEL
ALT: 24 U/L (ref 0–44)
AST: 19 U/L (ref 15–41)
Albumin: 3.7 g/dL (ref 3.5–5.0)
Alkaline Phosphatase: 115 U/L (ref 38–126)
Anion gap: 11 (ref 5–15)
BUN: 22 mg/dL (ref 8–23)
CO2: 22 mmol/L (ref 22–32)
Calcium: 9.2 mg/dL (ref 8.9–10.3)
Chloride: 103 mmol/L (ref 98–111)
Creatinine, Ser: 1.13 mg/dL — ABNORMAL HIGH (ref 0.44–1.00)
GFR, Estimated: 52 mL/min — ABNORMAL LOW (ref >60)
Glucose, Bld: 189 mg/dL — ABNORMAL HIGH (ref 70–99)
Potassium: 3.9 mmol/L (ref 3.5–5.1)
Sodium: 136 mmol/L (ref 135–145)
Total Bilirubin: 0.6 mg/dL (ref 0.3–1.2)
Total Protein: 6.4 g/dL — ABNORMAL LOW (ref 6.5–8.1)

## 2022-02-28 LAB — CBC
HCT: 32.4 % — ABNORMAL LOW (ref 36.0–46.0)
Hemoglobin: 10.9 g/dL — ABNORMAL LOW (ref 12.0–15.0)
MCH: 33 pg (ref 26.0–34.0)
MCHC: 33.6 g/dL (ref 30.0–36.0)
MCV: 98.2 fL (ref 80.0–100.0)
Platelets: 222 10*3/uL (ref 150–400)
RBC: 3.3 MIL/uL — ABNORMAL LOW (ref 3.87–5.11)
RDW: 14.5 % (ref 11.5–15.5)
WBC: 4.4 10*3/uL (ref 4.0–10.5)
nRBC: 0 % (ref 0.0–0.2)

## 2022-02-28 LAB — PHOSPHORUS: Phosphorus: 2.3 mg/dL — ABNORMAL LOW (ref 2.5–4.6)

## 2022-02-28 LAB — MAGNESIUM: Magnesium: 1.6 mg/dL — ABNORMAL LOW (ref 1.7–2.4)

## 2022-03-01 ENCOUNTER — Encounter: Payer: Self-pay | Admitting: Podiatry

## 2022-03-01 ENCOUNTER — Ambulatory Visit (INDEPENDENT_AMBULATORY_CARE_PROVIDER_SITE_OTHER): Payer: Medicare Other | Admitting: Podiatry

## 2022-03-01 DIAGNOSIS — M79674 Pain in right toe(s): Secondary | ICD-10-CM

## 2022-03-01 DIAGNOSIS — Z940 Kidney transplant status: Secondary | ICD-10-CM

## 2022-03-01 DIAGNOSIS — B351 Tinea unguium: Secondary | ICD-10-CM

## 2022-03-01 DIAGNOSIS — M79675 Pain in left toe(s): Secondary | ICD-10-CM

## 2022-03-01 DIAGNOSIS — E1151 Type 2 diabetes mellitus with diabetic peripheral angiopathy without gangrene: Secondary | ICD-10-CM

## 2022-03-01 LAB — BK QUANT PCR (PLASMA/SERUM): BK Quantitaion PCR: POSITIVE IU/mL

## 2022-03-01 NOTE — Progress Notes (Unsigned)
  Subjective:  Patient ID: Elberta Fortis, female    DOB: 08/04/1950,  MRN: [id number]  KY MOSKOWITZ presents to clinic today for {jgcomplaint:23593}  Chief Complaint  Patient presents with   Follow-up    Patient is a diabetic. B.S.am-164 Last A1c-5.3 PCP- Rulon Abide, PA-C/Last visit-02/25/22 Patient is taking all medications prescribed.   New problem(s): None. {jgcomplaint:23593}  PCP is Erby Pian, PA-C.  Allergies  Allergen Reactions   Tape Other (See Comments)    Paper tape rips skin.    Semaglutide Rash    Rybelsus 3 mg    Review of Systems: Negative except as noted in the HPI.  Objective: No changes noted in today's physical examination. Vitals:   NANNETTE ZILL is a pleasant 72 y.o. female {jgbodyhabitus:24098} AAO x 3.  Assessment/Plan: No diagnosis found.  No orders of the defined types were placed in this encounter.   None {Jgplan:23602::"-Patient/POA to call should there be question/concern in the interim."}   No follow-ups on file.  Marzetta Board, DPM

## 2022-03-02 ENCOUNTER — Encounter: Payer: Self-pay | Admitting: Podiatry

## 2022-03-02 LAB — TACROLIMUS LEVEL: Tacrolimus (FK506) - LabCorp: 8.8 ng/mL (ref 2.0–20.0)

## 2022-03-14 ENCOUNTER — Other Ambulatory Visit (HOSPITAL_COMMUNITY)
Admission: RE | Admit: 2022-03-14 | Discharge: 2022-03-14 | Disposition: A | Discharge status: Home / Self Care | Payer: Medicare Other | Source: Other Acute Inpatient Hospital | Attending: Physician Assistant | Admitting: Physician Assistant

## 2022-03-14 DIAGNOSIS — Z940 Kidney transplant status: Secondary | ICD-10-CM | POA: Diagnosis present

## 2022-03-14 LAB — PHOSPHORUS: Phosphorus: 2.7 mg/dL (ref 2.5–4.6)

## 2022-03-14 LAB — COMPREHENSIVE METABOLIC PANEL
ALT: 27 U/L (ref 0–44)
AST: 25 U/L (ref 15–41)
Albumin: 3.8 g/dL (ref 3.5–5.0)
Alkaline Phosphatase: 93 U/L (ref 38–126)
Anion gap: 9 (ref 5–15)
BUN: 12 mg/dL (ref 8–23)
CO2: 21 mmol/L — ABNORMAL LOW (ref 22–32)
Calcium: 9.1 mg/dL (ref 8.9–10.3)
Chloride: 101 mmol/L (ref 98–111)
Creatinine, Ser: 1.11 mg/dL — ABNORMAL HIGH (ref 0.44–1.00)
GFR, Estimated: 53 mL/min — ABNORMAL LOW (ref >60)
Glucose, Bld: 204 mg/dL — ABNORMAL HIGH (ref 70–99)
Potassium: 4 mmol/L (ref 3.5–5.1)
Sodium: 131 mmol/L — ABNORMAL LOW (ref 135–145)
Total Bilirubin: 1.1 mg/dL (ref 0.3–1.2)
Total Protein: 6.6 g/dL (ref 6.5–8.1)

## 2022-03-14 LAB — CBC
HCT: 34.7 % — ABNORMAL LOW (ref 36.0–46.0)
Hemoglobin: 11.4 g/dL — ABNORMAL LOW (ref 12.0–15.0)
MCH: 32.8 pg (ref 26.0–34.0)
MCHC: 32.9 g/dL (ref 30.0–36.0)
MCV: 99.7 fL (ref 80.0–100.0)
Platelets: 174 10*3/uL (ref 150–400)
RBC: 3.48 MIL/uL — ABNORMAL LOW (ref 3.87–5.11)
RDW: 14.6 % (ref 11.5–15.5)
WBC: 5.2 10*3/uL (ref 4.0–10.5)
nRBC: 0 % (ref 0.0–0.2)

## 2022-03-14 LAB — MAGNESIUM: Magnesium: 1.5 mg/dL — ABNORMAL LOW (ref 1.7–2.4)

## 2022-03-16 ENCOUNTER — Other Ambulatory Visit (HOSPITAL_COMMUNITY)
Admission: AD | Admit: 2022-03-16 | Discharge: 2022-03-16 | Disposition: A | Discharge status: Home / Self Care | Payer: Medicare Other | Source: Ambulatory Visit | Attending: Physician Assistant | Admitting: Physician Assistant

## 2022-03-16 DIAGNOSIS — Z940 Kidney transplant status: Secondary | ICD-10-CM | POA: Diagnosis present

## 2022-03-16 LAB — BK QUANT PCR (PLASMA/SERUM): BK Quantitaion PCR: POSITIVE IU/mL

## 2022-03-16 LAB — COMPREHENSIVE METABOLIC PANEL
ALT: 27 U/L (ref 0–44)
AST: 26 U/L (ref 15–41)
Albumin: 3.6 g/dL (ref 3.5–5.0)
Alkaline Phosphatase: 82 U/L (ref 38–126)
Anion gap: 11 (ref 5–15)
BUN: 17 mg/dL (ref 8–23)
CO2: 20 mmol/L — ABNORMAL LOW (ref 22–32)
Calcium: 9.2 mg/dL (ref 8.9–10.3)
Chloride: 103 mmol/L (ref 98–111)
Creatinine, Ser: 1.06 mg/dL — ABNORMAL HIGH (ref 0.44–1.00)
GFR, Estimated: 56 mL/min — ABNORMAL LOW (ref >60)
Glucose, Bld: 178 mg/dL — ABNORMAL HIGH (ref 70–99)
Potassium: 3.7 mmol/L (ref 3.5–5.1)
Sodium: 134 mmol/L — ABNORMAL LOW (ref 135–145)
Total Bilirubin: 1 mg/dL (ref 0.3–1.2)
Total Protein: 6.5 g/dL (ref 6.5–8.1)

## 2022-03-16 LAB — PHOSPHORUS: Phosphorus: 2.8 mg/dL (ref 2.5–4.6)

## 2022-03-16 LAB — CBC WITH DIFFERENTIAL/PLATELET
Abs Immature Granulocytes: 0.01 K/uL (ref 0.00–0.07)
Basophils Absolute: 0 K/uL (ref 0.0–0.1)
Basophils Relative: 1 %
Eosinophils Absolute: 0.1 K/uL (ref 0.0–0.5)
Eosinophils Relative: 2 %
HCT: 35.1 % — ABNORMAL LOW (ref 36.0–46.0)
Hemoglobin: 11.8 g/dL — ABNORMAL LOW (ref 12.0–15.0)
Immature Granulocytes: 0 %
Lymphocytes Relative: 13 %
Lymphs Abs: 0.5 K/uL — ABNORMAL LOW (ref 0.7–4.0)
MCH: 32.8 pg (ref 26.0–34.0)
MCHC: 33.6 g/dL (ref 30.0–36.0)
MCV: 97.5 fL (ref 80.0–100.0)
Monocytes Absolute: 0.2 K/uL (ref 0.1–1.0)
Monocytes Relative: 6 %
Neutro Abs: 2.7 K/uL (ref 1.7–7.7)
Neutrophils Relative %: 78 %
Platelets: 173 K/uL (ref 150–400)
RBC: 3.6 MIL/uL — ABNORMAL LOW (ref 3.87–5.11)
RDW: 14.2 % (ref 11.5–15.5)
WBC: 3.5 K/uL — ABNORMAL LOW (ref 4.0–10.5)
nRBC: 0 % (ref 0.0–0.2)

## 2022-03-16 LAB — MAGNESIUM: Magnesium: 1.5 mg/dL — ABNORMAL LOW (ref 1.7–2.4)

## 2022-03-17 LAB — BK QUANT PCR (PLASMA/SERUM)
BK Quantitaion PCR: 22 IU/mL
Log10 BK Qn PCR: 1.342 log10 IU/mL

## 2022-03-17 LAB — TACROLIMUS LEVEL: Tacrolimus (FK506) - LabCorp: 8.1 ng/mL (ref 2.0–20.0)

## 2022-03-18 LAB — TACROLIMUS LEVEL: Tacrolimus (FK506) - LabCorp: 12.7 ng/mL (ref 2.0–20.0)

## 2022-03-22 ENCOUNTER — Other Ambulatory Visit (HOSPITAL_COMMUNITY)
Admission: RE | Admit: 2022-03-22 | Discharge: 2022-03-22 | Disposition: A | Discharge status: Home / Self Care | Payer: Medicare Other | Source: Ambulatory Visit | Attending: Ophthalmology | Admitting: Ophthalmology

## 2022-03-22 DIAGNOSIS — E785 Hyperlipidemia, unspecified: Secondary | ICD-10-CM | POA: Insufficient documentation

## 2022-03-22 DIAGNOSIS — E0590 Thyrotoxicosis, unspecified without thyrotoxic crisis or storm: Secondary | ICD-10-CM | POA: Diagnosis present

## 2022-03-22 DIAGNOSIS — I5033 Acute on chronic diastolic (congestive) heart failure: Secondary | ICD-10-CM | POA: Insufficient documentation

## 2022-03-22 DIAGNOSIS — E1351 Other specified diabetes mellitus with diabetic peripheral angiopathy without gangrene: Secondary | ICD-10-CM | POA: Diagnosis not present

## 2022-03-22 DIAGNOSIS — Z720 Tobacco use: Secondary | ICD-10-CM | POA: Diagnosis not present

## 2022-03-22 DIAGNOSIS — I110 Hypertensive heart disease with heart failure: Secondary | ICD-10-CM | POA: Diagnosis not present

## 2022-03-22 LAB — CBC WITH DIFFERENTIAL/PLATELET
Abs Immature Granulocytes: 0.02 10*3/uL (ref 0.00–0.07)
Basophils Absolute: 0 10*3/uL (ref 0.0–0.1)
Basophils Relative: 1 %
Eosinophils Absolute: 0 10*3/uL (ref 0.0–0.5)
Eosinophils Relative: 1 %
HCT: 34.6 % — ABNORMAL LOW (ref 36.0–46.0)
Hemoglobin: 11.7 g/dL — ABNORMAL LOW (ref 12.0–15.0)
Immature Granulocytes: 1 %
Lymphocytes Relative: 17 %
Lymphs Abs: 0.6 10*3/uL — ABNORMAL LOW (ref 0.7–4.0)
MCH: 33.5 pg (ref 26.0–34.0)
MCHC: 33.8 g/dL (ref 30.0–36.0)
MCV: 99.1 fL (ref 80.0–100.0)
Monocytes Absolute: 0.3 10*3/uL (ref 0.1–1.0)
Monocytes Relative: 9 %
Neutro Abs: 2.5 10*3/uL (ref 1.7–7.7)
Neutrophils Relative %: 71 %
Platelets: 230 10*3/uL (ref 150–400)
RBC: 3.49 MIL/uL — ABNORMAL LOW (ref 3.87–5.11)
RDW: 13.9 % (ref 11.5–15.5)
WBC: 3.5 10*3/uL — ABNORMAL LOW (ref 4.0–10.5)
nRBC: 0 % (ref 0.0–0.2)

## 2022-03-22 LAB — COMPREHENSIVE METABOLIC PANEL
ALT: 23 U/L (ref 0–44)
AST: 22 U/L (ref 15–41)
Albumin: 3.8 g/dL (ref 3.5–5.0)
Alkaline Phosphatase: 78 U/L (ref 38–126)
Anion gap: 13 (ref 5–15)
BUN: 20 mg/dL (ref 8–23)
CO2: 21 mmol/L — ABNORMAL LOW (ref 22–32)
Calcium: 9.4 mg/dL (ref 8.9–10.3)
Chloride: 103 mmol/L (ref 98–111)
Creatinine, Ser: 0.92 mg/dL (ref 0.44–1.00)
GFR, Estimated: >60 mL/min (ref >60)
Glucose, Bld: 164 mg/dL — ABNORMAL HIGH (ref 70–99)
Potassium: 3.8 mmol/L (ref 3.5–5.1)
Sodium: 137 mmol/L (ref 135–145)
Total Bilirubin: 0.8 mg/dL (ref 0.3–1.2)
Total Protein: 6.5 g/dL (ref 6.5–8.1)

## 2022-03-22 LAB — PHOSPHORUS: Phosphorus: 2.6 mg/dL (ref 2.5–4.6)

## 2022-03-22 LAB — MAGNESIUM: Magnesium: 1.6 mg/dL — ABNORMAL LOW (ref 1.7–2.4)

## 2022-03-23 LAB — BK QUANT PCR (PLASMA/SERUM): BK Quantitaion PCR: POSITIVE IU/mL

## 2022-03-25 ENCOUNTER — Encounter (INDEPENDENT_AMBULATORY_CARE_PROVIDER_SITE_OTHER): Payer: Medicare Other | Admitting: Ophthalmology

## 2022-03-25 DIAGNOSIS — I10 Essential (primary) hypertension: Secondary | ICD-10-CM

## 2022-03-25 DIAGNOSIS — H35372 Puckering of macula, left eye: Secondary | ICD-10-CM

## 2022-03-25 DIAGNOSIS — E113593 Type 2 diabetes mellitus with proliferative diabetic retinopathy without macular edema, bilateral: Secondary | ICD-10-CM | POA: Diagnosis not present

## 2022-03-25 DIAGNOSIS — H43813 Vitreous degeneration, bilateral: Secondary | ICD-10-CM

## 2022-03-25 DIAGNOSIS — H35033 Hypertensive retinopathy, bilateral: Secondary | ICD-10-CM

## 2022-03-25 LAB — TACROLIMUS LEVEL: Tacrolimus (FK506) - LabCorp: 7.6 ng/mL (ref 2.0–20.0)

## 2022-03-28 ENCOUNTER — Other Ambulatory Visit (HOSPITAL_COMMUNITY)
Admission: RE | Admit: 2022-03-28 | Discharge: 2022-03-28 | Disposition: A | Discharge status: Home / Self Care | Payer: Medicare Other | Source: Ambulatory Visit | Attending: Physician Assistant | Admitting: Physician Assistant

## 2022-03-28 DIAGNOSIS — Z940 Kidney transplant status: Secondary | ICD-10-CM | POA: Diagnosis present

## 2022-03-28 LAB — COMPREHENSIVE METABOLIC PANEL
ALT: 25 U/L (ref 0–44)
AST: 24 U/L (ref 15–41)
Albumin: 3.6 g/dL (ref 3.5–5.0)
Alkaline Phosphatase: 70 U/L (ref 38–126)
Anion gap: 7 (ref 5–15)
BUN: 16 mg/dL (ref 8–23)
CO2: 25 mmol/L (ref 22–32)
Calcium: 9.4 mg/dL (ref 8.9–10.3)
Chloride: 102 mmol/L (ref 98–111)
Creatinine, Ser: 1.08 mg/dL — ABNORMAL HIGH (ref 0.44–1.00)
GFR, Estimated: 55 mL/min — ABNORMAL LOW (ref >60)
Glucose, Bld: 172 mg/dL — ABNORMAL HIGH (ref 70–99)
Potassium: 4.3 mmol/L (ref 3.5–5.1)
Sodium: 134 mmol/L — ABNORMAL LOW (ref 135–145)
Total Bilirubin: 0.4 mg/dL (ref 0.3–1.2)
Total Protein: 6.1 g/dL — ABNORMAL LOW (ref 6.5–8.1)

## 2022-03-28 LAB — CBC WITH DIFFERENTIAL/PLATELET
Abs Immature Granulocytes: 0.05 10*3/uL (ref 0.00–0.07)
Basophils Absolute: 0 10*3/uL (ref 0.0–0.1)
Basophils Relative: 1 %
Eosinophils Absolute: 0 10*3/uL (ref 0.0–0.5)
Eosinophils Relative: 1 %
HCT: 33.9 % — ABNORMAL LOW (ref 36.0–46.0)
Hemoglobin: 11.2 g/dL — ABNORMAL LOW (ref 12.0–15.0)
Immature Granulocytes: 1 %
Lymphocytes Relative: 12 %
Lymphs Abs: 0.5 10*3/uL — ABNORMAL LOW (ref 0.7–4.0)
MCH: 32.5 pg (ref 26.0–34.0)
MCHC: 33 g/dL (ref 30.0–36.0)
MCV: 98.3 fL (ref 80.0–100.0)
Monocytes Absolute: 0.2 10*3/uL (ref 0.1–1.0)
Monocytes Relative: 7 %
Neutro Abs: 2.9 10*3/uL (ref 1.7–7.7)
Neutrophils Relative %: 78 %
Platelets: 214 10*3/uL (ref 150–400)
RBC: 3.45 MIL/uL — ABNORMAL LOW (ref 3.87–5.11)
RDW: 14 % (ref 11.5–15.5)
WBC: 3.7 10*3/uL — ABNORMAL LOW (ref 4.0–10.5)
nRBC: 0 % (ref 0.0–0.2)

## 2022-03-28 LAB — PHOSPHORUS: Phosphorus: 2.8 mg/dL (ref 2.5–4.6)

## 2022-03-28 LAB — MAGNESIUM: Magnesium: 1.7 mg/dL (ref 1.7–2.4)

## 2022-03-30 ENCOUNTER — Other Ambulatory Visit (HOSPITAL_COMMUNITY)
Admission: RE | Admit: 2022-03-30 | Discharge: 2022-03-30 | Disposition: A | Discharge status: Home / Self Care | Payer: Medicare Other | Source: Ambulatory Visit | Attending: Physician Assistant | Admitting: Physician Assistant

## 2022-03-30 DIAGNOSIS — Z940 Kidney transplant status: Secondary | ICD-10-CM | POA: Diagnosis present

## 2022-03-30 LAB — PHOSPHORUS: Phosphorus: 3.5 mg/dL (ref 2.5–4.6)

## 2022-03-30 LAB — COMPREHENSIVE METABOLIC PANEL
ALT: 28 U/L (ref 0–44)
AST: 24 U/L (ref 15–41)
Albumin: 3.9 g/dL (ref 3.5–5.0)
Alkaline Phosphatase: 79 U/L (ref 38–126)
Anion gap: 9 (ref 5–15)
BUN: 15 mg/dL (ref 8–23)
CO2: 26 mmol/L (ref 22–32)
Calcium: 9.5 mg/dL (ref 8.9–10.3)
Chloride: 103 mmol/L (ref 98–111)
Creatinine, Ser: 1.09 mg/dL — ABNORMAL HIGH (ref 0.44–1.00)
GFR, Estimated: 54 mL/min — ABNORMAL LOW (ref >60)
Glucose, Bld: 175 mg/dL — ABNORMAL HIGH (ref 70–99)
Potassium: 4.4 mmol/L (ref 3.5–5.1)
Sodium: 138 mmol/L (ref 135–145)
Total Bilirubin: 0.8 mg/dL (ref 0.3–1.2)
Total Protein: 6.7 g/dL (ref 6.5–8.1)

## 2022-03-30 LAB — CBC WITH DIFFERENTIAL/PLATELET
Abs Immature Granulocytes: 0.13 10*3/uL — ABNORMAL HIGH (ref 0.00–0.07)
Basophils Absolute: 0 10*3/uL (ref 0.0–0.1)
Basophils Relative: 1 %
Eosinophils Absolute: 0.1 10*3/uL (ref 0.0–0.5)
Eosinophils Relative: 1 %
HCT: 35.3 % — ABNORMAL LOW (ref 36.0–46.0)
Hemoglobin: 11.9 g/dL — ABNORMAL LOW (ref 12.0–15.0)
Immature Granulocytes: 3 %
Lymphocytes Relative: 12 %
Lymphs Abs: 0.5 10*3/uL — ABNORMAL LOW (ref 0.7–4.0)
MCH: 33.1 pg (ref 26.0–34.0)
MCHC: 33.7 g/dL (ref 30.0–36.0)
MCV: 98.3 fL (ref 80.0–100.0)
Monocytes Absolute: 0.3 10*3/uL (ref 0.1–1.0)
Monocytes Relative: 8 %
Neutro Abs: 3.1 10*3/uL (ref 1.7–7.7)
Neutrophils Relative %: 75 %
Platelets: 235 10*3/uL (ref 150–400)
RBC: 3.59 MIL/uL — ABNORMAL LOW (ref 3.87–5.11)
RDW: 13.9 % (ref 11.5–15.5)
WBC: 4.1 10*3/uL (ref 4.0–10.5)
nRBC: 0 % (ref 0.0–0.2)

## 2022-03-30 LAB — BK QUANT PCR (PLASMA/SERUM): BK Quantitaion PCR: POSITIVE IU/mL

## 2022-03-30 LAB — MAGNESIUM: Magnesium: 1.7 mg/dL (ref 1.7–2.4)

## 2022-03-30 LAB — TACROLIMUS LEVEL: Tacrolimus (FK506) - LabCorp: 6.9 ng/mL (ref 2.0–20.0)

## 2022-03-31 LAB — BK QUANT PCR (PLASMA/SERUM)
BK Quantitaion PCR: 67 IU/mL
Log10 BK Qn PCR: 1.826 log10 IU/mL

## 2022-04-01 LAB — TACROLIMUS LEVEL: Tacrolimus (FK506) - LabCorp: 7.2 ng/mL (ref 2.0–20.0)

## 2022-04-04 ENCOUNTER — Other Ambulatory Visit (HOSPITAL_COMMUNITY)
Admission: RE | Admit: 2022-04-04 | Discharge: 2022-04-04 | Disposition: A | Discharge status: Home / Self Care | Payer: Medicare Other | Source: Other Acute Inpatient Hospital | Attending: Physician Assistant | Admitting: Physician Assistant

## 2022-04-04 DIAGNOSIS — T8619 Other complication of kidney transplant: Secondary | ICD-10-CM | POA: Diagnosis present

## 2022-04-04 LAB — CBC WITH DIFFERENTIAL/PLATELET
Abs Immature Granulocytes: 0.25 10*3/uL — ABNORMAL HIGH (ref 0.00–0.07)
Basophils Absolute: 0 10*3/uL (ref 0.0–0.1)
Basophils Relative: 1 %
Eosinophils Absolute: 0.1 10*3/uL (ref 0.0–0.5)
Eosinophils Relative: 1 %
HCT: 36.8 % (ref 36.0–46.0)
Hemoglobin: 11.9 g/dL — ABNORMAL LOW (ref 12.0–15.0)
Immature Granulocytes: 6 %
Lymphocytes Relative: 9 %
Lymphs Abs: 0.4 10*3/uL — ABNORMAL LOW (ref 0.7–4.0)
MCH: 32 pg (ref 26.0–34.0)
MCHC: 32.3 g/dL (ref 30.0–36.0)
MCV: 98.9 fL (ref 80.0–100.0)
Monocytes Absolute: 0.3 10*3/uL (ref 0.1–1.0)
Monocytes Relative: 7 %
Neutro Abs: 3.5 10*3/uL (ref 1.7–7.7)
Neutrophils Relative %: 76 %
Platelets: 218 10*3/uL (ref 150–400)
RBC: 3.72 MIL/uL — ABNORMAL LOW (ref 3.87–5.11)
RDW: 14 % (ref 11.5–15.5)
Smear Review: NORMAL
WBC: 4.6 10*3/uL (ref 4.0–10.5)
nRBC: 0 % (ref 0.0–0.2)

## 2022-04-04 LAB — COMPREHENSIVE METABOLIC PANEL
ALT: 29 U/L (ref 0–44)
AST: 25 U/L (ref 15–41)
Albumin: 4 g/dL (ref 3.5–5.0)
Alkaline Phosphatase: 79 U/L (ref 38–126)
Anion gap: 9 (ref 5–15)
BUN: 14 mg/dL (ref 8–23)
CO2: 23 mmol/L (ref 22–32)
Calcium: 9.8 mg/dL (ref 8.9–10.3)
Chloride: 104 mmol/L (ref 98–111)
Creatinine, Ser: 1.07 mg/dL — ABNORMAL HIGH (ref 0.44–1.00)
GFR, Estimated: 55 mL/min — ABNORMAL LOW (ref >60)
Glucose, Bld: 193 mg/dL — ABNORMAL HIGH (ref 70–99)
Potassium: 4.1 mmol/L (ref 3.5–5.1)
Sodium: 136 mmol/L (ref 135–145)
Total Bilirubin: 0.6 mg/dL (ref 0.3–1.2)
Total Protein: 6.8 g/dL (ref 6.5–8.1)

## 2022-04-04 LAB — PHOSPHORUS: Phosphorus: 3 mg/dL (ref 2.5–4.6)

## 2022-04-04 LAB — MAGNESIUM: Magnesium: 1.7 mg/dL (ref 1.7–2.4)

## 2022-04-05 LAB — BK QUANT PCR (PLASMA/SERUM)
BK Quantitaion PCR: 38 IU/mL
Log10 BK Qn PCR: 1.58 log10 IU/mL

## 2022-04-05 LAB — TACROLIMUS LEVEL: Tacrolimus (FK506) - LabCorp: 8.2 ng/mL (ref 2.0–20.0)

## 2022-04-06 ENCOUNTER — Other Ambulatory Visit (HOSPITAL_COMMUNITY)
Admission: AD | Admit: 2022-04-06 | Discharge: 2022-04-06 | Disposition: A | Discharge status: Home / Self Care | Payer: Medicare Other | Source: Ambulatory Visit | Attending: Physician Assistant | Admitting: Physician Assistant

## 2022-04-06 DIAGNOSIS — Z940 Kidney transplant status: Secondary | ICD-10-CM | POA: Insufficient documentation

## 2022-04-06 LAB — CBC WITH DIFFERENTIAL/PLATELET
Abs Immature Granulocytes: 0.24 10*3/uL — ABNORMAL HIGH (ref 0.00–0.07)
Basophils Absolute: 0 10*3/uL (ref 0.0–0.1)
Basophils Relative: 1 %
Eosinophils Absolute: 0.1 10*3/uL (ref 0.0–0.5)
Eosinophils Relative: 2 %
HCT: 36.2 % (ref 36.0–46.0)
Hemoglobin: 12.1 g/dL (ref 12.0–15.0)
Immature Granulocytes: 5 %
Lymphocytes Relative: 13 %
Lymphs Abs: 0.6 10*3/uL — ABNORMAL LOW (ref 0.7–4.0)
MCH: 33.4 pg (ref 26.0–34.0)
MCHC: 33.4 g/dL (ref 30.0–36.0)
MCV: 100 fL (ref 80.0–100.0)
Monocytes Absolute: 0.3 10*3/uL (ref 0.1–1.0)
Monocytes Relative: 7 %
Neutro Abs: 3.2 10*3/uL (ref 1.7–7.7)
Neutrophils Relative %: 72 %
Platelets: 211 10*3/uL (ref 150–400)
RBC: 3.62 MIL/uL — ABNORMAL LOW (ref 3.87–5.11)
RDW: 14.1 % (ref 11.5–15.5)
WBC: 4.5 10*3/uL (ref 4.0–10.5)
nRBC: 0 % (ref 0.0–0.2)

## 2022-04-06 LAB — COMPREHENSIVE METABOLIC PANEL
ALT: 35 U/L (ref 0–44)
AST: 27 U/L (ref 15–41)
Albumin: 4 g/dL (ref 3.5–5.0)
Alkaline Phosphatase: 96 U/L (ref 38–126)
Anion gap: 11 (ref 5–15)
BUN: 14 mg/dL (ref 8–23)
CO2: 24 mmol/L (ref 22–32)
Calcium: 9.4 mg/dL (ref 8.9–10.3)
Chloride: 100 mmol/L (ref 98–111)
Creatinine, Ser: 1.05 mg/dL — ABNORMAL HIGH (ref 0.44–1.00)
GFR, Estimated: 56 mL/min — ABNORMAL LOW (ref >60)
Glucose, Bld: 244 mg/dL — ABNORMAL HIGH (ref 70–99)
Potassium: 3.7 mmol/L (ref 3.5–5.1)
Sodium: 135 mmol/L (ref 135–145)
Total Bilirubin: 0.3 mg/dL (ref 0.3–1.2)
Total Protein: 6.7 g/dL (ref 6.5–8.1)

## 2022-04-06 LAB — PHOSPHORUS: Phosphorus: 2.8 mg/dL (ref 2.5–4.6)

## 2022-04-06 LAB — MAGNESIUM: Magnesium: 1.7 mg/dL (ref 1.7–2.4)

## 2022-04-07 LAB — BK QUANT PCR (PLASMA/SERUM)
BK Quantitaion PCR: 42 IU/mL
Log10 BK Qn PCR: 1.623 log10 IU/mL

## 2022-04-08 LAB — TACROLIMUS LEVEL: Tacrolimus (FK506) - LabCorp: 8.1 ng/mL (ref 2.0–20.0)

## 2022-04-11 ENCOUNTER — Other Ambulatory Visit (HOSPITAL_COMMUNITY)
Admission: AD | Admit: 2022-04-11 | Discharge: 2022-04-11 | Disposition: A | Discharge status: Home / Self Care | Payer: Medicare Other | Source: Ambulatory Visit | Attending: Internal Medicine | Admitting: Internal Medicine

## 2022-04-11 DIAGNOSIS — Z940 Kidney transplant status: Secondary | ICD-10-CM | POA: Insufficient documentation

## 2022-04-11 LAB — MAGNESIUM: Magnesium: 1.8 mg/dL (ref 1.7–2.4)

## 2022-04-11 LAB — CBC WITH DIFFERENTIAL/PLATELET
Abs Immature Granulocytes: 0.19 10*3/uL — ABNORMAL HIGH (ref 0.00–0.07)
Basophils Absolute: 0 10*3/uL (ref 0.0–0.1)
Basophils Relative: 1 %
Eosinophils Absolute: 0.1 10*3/uL (ref 0.0–0.5)
Eosinophils Relative: 2 %
HCT: 37.7 % (ref 36.0–46.0)
Hemoglobin: 13 g/dL (ref 12.0–15.0)
Immature Granulocytes: 5 %
Lymphocytes Relative: 15 %
Lymphs Abs: 0.6 10*3/uL — ABNORMAL LOW (ref 0.7–4.0)
MCH: 34.1 pg — ABNORMAL HIGH (ref 26.0–34.0)
MCHC: 34.5 g/dL (ref 30.0–36.0)
MCV: 99 fL (ref 80.0–100.0)
Monocytes Absolute: 0.4 10*3/uL (ref 0.1–1.0)
Monocytes Relative: 11 %
Neutro Abs: 2.4 10*3/uL (ref 1.7–7.7)
Neutrophils Relative %: 66 %
Platelets: 200 10*3/uL (ref 150–400)
RBC: 3.81 MIL/uL — ABNORMAL LOW (ref 3.87–5.11)
RDW: 14.2 % (ref 11.5–15.5)
WBC: 3.6 10*3/uL — ABNORMAL LOW (ref 4.0–10.5)
nRBC: 0 % (ref 0.0–0.2)

## 2022-04-11 LAB — COMPREHENSIVE METABOLIC PANEL
ALT: 41 U/L (ref 0–44)
AST: 25 U/L (ref 15–41)
Albumin: 3.9 g/dL (ref 3.5–5.0)
Alkaline Phosphatase: 71 U/L (ref 38–126)
Anion gap: 9 (ref 5–15)
BUN: 17 mg/dL (ref 8–23)
CO2: 26 mmol/L (ref 22–32)
Calcium: 9.7 mg/dL (ref 8.9–10.3)
Chloride: 102 mmol/L (ref 98–111)
Creatinine, Ser: 1.06 mg/dL — ABNORMAL HIGH (ref 0.44–1.00)
GFR, Estimated: 56 mL/min — ABNORMAL LOW (ref >60)
Glucose, Bld: 219 mg/dL — ABNORMAL HIGH (ref 70–99)
Potassium: 4 mmol/L (ref 3.5–5.1)
Sodium: 137 mmol/L (ref 135–145)
Total Bilirubin: 0.9 mg/dL (ref 0.3–1.2)
Total Protein: 6.6 g/dL (ref 6.5–8.1)

## 2022-04-11 LAB — PHOSPHORUS: Phosphorus: 3.5 mg/dL (ref 2.5–4.6)

## 2022-04-12 LAB — BK QUANT PCR (PLASMA/SERUM)
BK Quantitaion PCR: 154 IU/mL
Log10 BK Qn PCR: 2.188 log10 IU/mL

## 2022-04-13 ENCOUNTER — Other Ambulatory Visit (HOSPITAL_COMMUNITY)
Admission: RE | Admit: 2022-04-13 | Discharge: 2022-04-13 | Disposition: A | Discharge status: Home / Self Care | Payer: Medicare Other | Source: Other Acute Inpatient Hospital | Attending: Physician Assistant | Admitting: Physician Assistant

## 2022-04-13 DIAGNOSIS — Z940 Kidney transplant status: Secondary | ICD-10-CM | POA: Insufficient documentation

## 2022-04-13 LAB — CBC WITH DIFFERENTIAL/PLATELET
Abs Immature Granulocytes: 0.17 10*3/uL — ABNORMAL HIGH (ref 0.00–0.07)
Basophils Absolute: 0 10*3/uL (ref 0.0–0.1)
Basophils Relative: 1 %
Eosinophils Absolute: 0.1 10*3/uL (ref 0.0–0.5)
Eosinophils Relative: 3 %
HCT: 37.9 % (ref 36.0–46.0)
Hemoglobin: 12.8 g/dL (ref 12.0–15.0)
Immature Granulocytes: 5 %
Lymphocytes Relative: 17 %
Lymphs Abs: 0.6 10*3/uL — ABNORMAL LOW (ref 0.7–4.0)
MCH: 33.4 pg (ref 26.0–34.0)
MCHC: 33.8 g/dL (ref 30.0–36.0)
MCV: 99 fL (ref 80.0–100.0)
Monocytes Absolute: 0.3 10*3/uL (ref 0.1–1.0)
Monocytes Relative: 10 %
Neutro Abs: 2.1 10*3/uL (ref 1.7–7.7)
Neutrophils Relative %: 64 %
Platelets: 188 10*3/uL (ref 150–400)
RBC: 3.83 MIL/uL — ABNORMAL LOW (ref 3.87–5.11)
RDW: 14.2 % (ref 11.5–15.5)
WBC: 3.3 10*3/uL — ABNORMAL LOW (ref 4.0–10.5)
nRBC: 0 % (ref 0.0–0.2)

## 2022-04-13 LAB — COMPREHENSIVE METABOLIC PANEL
ALT: 35 U/L (ref 0–44)
AST: 27 U/L (ref 15–41)
Albumin: 3.9 g/dL (ref 3.5–5.0)
Alkaline Phosphatase: 80 U/L (ref 38–126)
Anion gap: 8 (ref 5–15)
BUN: 11 mg/dL (ref 8–23)
CO2: 25 mmol/L (ref 22–32)
Calcium: 9.5 mg/dL (ref 8.9–10.3)
Chloride: 103 mmol/L (ref 98–111)
Creatinine, Ser: 1.03 mg/dL — ABNORMAL HIGH (ref 0.44–1.00)
GFR, Estimated: 58 mL/min — ABNORMAL LOW (ref >60)
Glucose, Bld: 174 mg/dL — ABNORMAL HIGH (ref 70–99)
Potassium: 3.8 mmol/L (ref 3.5–5.1)
Sodium: 136 mmol/L (ref 135–145)
Total Bilirubin: 0.8 mg/dL (ref 0.3–1.2)
Total Protein: 6.8 g/dL (ref 6.5–8.1)

## 2022-04-13 LAB — MAGNESIUM: Magnesium: 1.8 mg/dL (ref 1.7–2.4)

## 2022-04-13 LAB — PHOSPHORUS: Phosphorus: 3.7 mg/dL (ref 2.5–4.6)

## 2022-04-13 LAB — TACROLIMUS LEVEL: Tacrolimus (FK506) - LabCorp: 6 ng/mL (ref 2.0–20.0)

## 2022-04-14 LAB — BK QUANT PCR (PLASMA/SERUM)
BK Quantitaion PCR: 544 IU/mL
Log10 BK Qn PCR: 2.736 log10 IU/mL

## 2022-04-14 LAB — TACROLIMUS LEVEL: Tacrolimus (FK506) - LabCorp: 6.8 ng/mL (ref 2.0–20.0)

## 2022-04-18 ENCOUNTER — Other Ambulatory Visit (HOSPITAL_COMMUNITY)
Admission: AD | Admit: 2022-04-18 | Discharge: 2022-04-18 | Disposition: A | Discharge status: Home / Self Care | Payer: Medicare Other | Source: Ambulatory Visit | Attending: Internal Medicine | Admitting: Internal Medicine

## 2022-04-18 DIAGNOSIS — Z940 Kidney transplant status: Secondary | ICD-10-CM | POA: Insufficient documentation

## 2022-04-18 LAB — COMPREHENSIVE METABOLIC PANEL
ALT: 33 U/L (ref 0–44)
AST: 25 U/L (ref 15–41)
Albumin: 3.7 g/dL (ref 3.5–5.0)
Alkaline Phosphatase: 73 U/L (ref 38–126)
Anion gap: 9 (ref 5–15)
BUN: 15 mg/dL (ref 8–23)
CO2: 22 mmol/L (ref 22–32)
Calcium: 9.5 mg/dL (ref 8.9–10.3)
Chloride: 105 mmol/L (ref 98–111)
Creatinine, Ser: 1.03 mg/dL — ABNORMAL HIGH (ref 0.44–1.00)
GFR, Estimated: 58 mL/min — ABNORMAL LOW (ref >60)
Glucose, Bld: 185 mg/dL — ABNORMAL HIGH (ref 70–99)
Potassium: 3.9 mmol/L (ref 3.5–5.1)
Sodium: 136 mmol/L (ref 135–145)
Total Bilirubin: 0.9 mg/dL (ref 0.3–1.2)
Total Protein: 6.6 g/dL (ref 6.5–8.1)

## 2022-04-18 LAB — CBC WITH DIFFERENTIAL/PLATELET
Abs Immature Granulocytes: 0.12 10*3/uL — ABNORMAL HIGH (ref 0.00–0.07)
Basophils Absolute: 0 10*3/uL (ref 0.0–0.1)
Basophils Relative: 1 %
Eosinophils Absolute: 0.1 10*3/uL (ref 0.0–0.5)
Eosinophils Relative: 2 %
HCT: 36.9 % (ref 36.0–46.0)
Hemoglobin: 12.3 g/dL (ref 12.0–15.0)
Immature Granulocytes: 4 %
Lymphocytes Relative: 19 %
Lymphs Abs: 0.6 10*3/uL — ABNORMAL LOW (ref 0.7–4.0)
MCH: 33.3 pg (ref 26.0–34.0)
MCHC: 33.3 g/dL (ref 30.0–36.0)
MCV: 100 fL (ref 80.0–100.0)
Monocytes Absolute: 0.3 10*3/uL (ref 0.1–1.0)
Monocytes Relative: 9 %
Neutro Abs: 2.2 10*3/uL (ref 1.7–7.7)
Neutrophils Relative %: 65 %
Platelets: 189 10*3/uL (ref 150–400)
RBC: 3.69 MIL/uL — ABNORMAL LOW (ref 3.87–5.11)
RDW: 13.9 % (ref 11.5–15.5)
WBC: 3.4 10*3/uL — ABNORMAL LOW (ref 4.0–10.5)
nRBC: 0 % (ref 0.0–0.2)

## 2022-04-18 LAB — MAGNESIUM: Magnesium: 1.7 mg/dL (ref 1.7–2.4)

## 2022-04-18 LAB — PHOSPHORUS: Phosphorus: 3.3 mg/dL (ref 2.5–4.6)

## 2022-04-20 ENCOUNTER — Other Ambulatory Visit (HOSPITAL_COMMUNITY)
Admission: AD | Admit: 2022-04-20 | Discharge: 2022-04-20 | Disposition: A | Discharge status: Home / Self Care | Payer: Medicare Other | Source: Ambulatory Visit | Attending: Physician Assistant | Admitting: Physician Assistant

## 2022-04-20 DIAGNOSIS — Z940 Kidney transplant status: Secondary | ICD-10-CM | POA: Insufficient documentation

## 2022-04-20 LAB — TACROLIMUS LEVEL: Tacrolimus (FK506) - LabCorp: 8.5 ng/mL (ref 2.0–20.0)

## 2022-04-20 LAB — CBC WITH DIFFERENTIAL/PLATELET
Abs Immature Granulocytes: 0.12 10*3/uL — ABNORMAL HIGH (ref 0.00–0.07)
Basophils Absolute: 0.1 10*3/uL (ref 0.0–0.1)
Basophils Relative: 1 %
Eosinophils Absolute: 0.1 10*3/uL (ref 0.0–0.5)
Eosinophils Relative: 2 %
HCT: 38.4 % (ref 36.0–46.0)
Hemoglobin: 13 g/dL (ref 12.0–15.0)
Immature Granulocytes: 3 %
Lymphocytes Relative: 18 %
Lymphs Abs: 0.6 10*3/uL — ABNORMAL LOW (ref 0.7–4.0)
MCH: 33.6 pg (ref 26.0–34.0)
MCHC: 33.9 g/dL (ref 30.0–36.0)
MCV: 99.2 fL (ref 80.0–100.0)
Monocytes Absolute: 0.4 10*3/uL (ref 0.1–1.0)
Monocytes Relative: 11 %
Neutro Abs: 2.3 10*3/uL (ref 1.7–7.7)
Neutrophils Relative %: 65 %
Platelets: 198 10*3/uL (ref 150–400)
RBC: 3.87 MIL/uL (ref 3.87–5.11)
RDW: 14 % (ref 11.5–15.5)
WBC: 3.5 10*3/uL — ABNORMAL LOW (ref 4.0–10.5)
nRBC: 0 % (ref 0.0–0.2)

## 2022-04-20 LAB — BK QUANT PCR (PLASMA/SERUM)
BK Quantitaion PCR: 1080 IU/mL
Log10 BK Qn PCR: 3.033 log10 IU/mL

## 2022-04-20 LAB — COMPREHENSIVE METABOLIC PANEL
ALT: 41 U/L (ref 0–44)
AST: 35 U/L (ref 15–41)
Albumin: 3.9 g/dL (ref 3.5–5.0)
Alkaline Phosphatase: 77 U/L (ref 38–126)
Anion gap: 9 (ref 5–15)
BUN: 12 mg/dL (ref 8–23)
CO2: 26 mmol/L (ref 22–32)
Calcium: 9.6 mg/dL (ref 8.9–10.3)
Chloride: 103 mmol/L (ref 98–111)
Creatinine, Ser: 1.06 mg/dL — ABNORMAL HIGH (ref 0.44–1.00)
GFR, Estimated: 56 mL/min — ABNORMAL LOW (ref >60)
Glucose, Bld: 149 mg/dL — ABNORMAL HIGH (ref 70–99)
Potassium: 4.1 mmol/L (ref 3.5–5.1)
Sodium: 138 mmol/L (ref 135–145)
Total Bilirubin: 0.6 mg/dL (ref 0.3–1.2)
Total Protein: 6.7 g/dL (ref 6.5–8.1)

## 2022-04-20 LAB — MAGNESIUM: Magnesium: 1.7 mg/dL (ref 1.7–2.4)

## 2022-04-20 LAB — PHOSPHORUS: Phosphorus: 3.3 mg/dL (ref 2.5–4.6)

## 2022-04-22 LAB — BK QUANT PCR (PLASMA/SERUM)
BK Quantitaion PCR: 808 [IU]/mL
Log10 BK Qn PCR: 2.907 {Log_IU}/mL

## 2022-04-22 LAB — TACROLIMUS LEVEL: Tacrolimus (FK506) - LabCorp: 7.9 ng/mL (ref 2.0–20.0)

## 2022-04-25 ENCOUNTER — Other Ambulatory Visit (HOSPITAL_COMMUNITY)
Admission: RE | Admit: 2022-04-25 | Discharge: 2022-04-25 | Disposition: A | Discharge status: Home / Self Care | Payer: Medicare Other | Source: Ambulatory Visit | Attending: Internal Medicine | Admitting: Internal Medicine

## 2022-04-25 DIAGNOSIS — Z940 Kidney transplant status: Secondary | ICD-10-CM | POA: Diagnosis present

## 2022-04-25 LAB — COMPREHENSIVE METABOLIC PANEL
ALT: 35 U/L (ref 0–44)
AST: 25 U/L (ref 15–41)
Albumin: 3.9 g/dL (ref 3.5–5.0)
Alkaline Phosphatase: 74 U/L (ref 38–126)
Anion gap: 8 (ref 5–15)
BUN: 16 mg/dL (ref 8–23)
CO2: 25 mmol/L (ref 22–32)
Calcium: 9.7 mg/dL (ref 8.9–10.3)
Chloride: 103 mmol/L (ref 98–111)
Creatinine, Ser: 1.1 mg/dL — ABNORMAL HIGH (ref 0.44–1.00)
GFR, Estimated: 53 mL/min — ABNORMAL LOW (ref >60)
Glucose, Bld: 189 mg/dL — ABNORMAL HIGH (ref 70–99)
Potassium: 4.2 mmol/L (ref 3.5–5.1)
Sodium: 136 mmol/L (ref 135–145)
Total Bilirubin: 0.7 mg/dL (ref 0.3–1.2)
Total Protein: 6.9 g/dL (ref 6.5–8.1)

## 2022-04-25 LAB — CBC WITH DIFFERENTIAL/PLATELET
Abs Immature Granulocytes: 0.05 10*3/uL (ref 0.00–0.07)
Basophils Absolute: 0 10*3/uL (ref 0.0–0.1)
Basophils Relative: 1 %
Eosinophils Absolute: 0.1 10*3/uL (ref 0.0–0.5)
Eosinophils Relative: 3 %
HCT: 41.2 % (ref 36.0–46.0)
Hemoglobin: 14 g/dL (ref 12.0–15.0)
Immature Granulocytes: 2 %
Lymphocytes Relative: 16 %
Lymphs Abs: 0.6 10*3/uL — ABNORMAL LOW (ref 0.7–4.0)
MCH: 33.9 pg (ref 26.0–34.0)
MCHC: 34 g/dL (ref 30.0–36.0)
MCV: 99.8 fL (ref 80.0–100.0)
Monocytes Absolute: 0.3 10*3/uL (ref 0.1–1.0)
Monocytes Relative: 10 %
Neutro Abs: 2.4 10*3/uL (ref 1.7–7.7)
Neutrophils Relative %: 68 %
Platelets: 196 10*3/uL (ref 150–400)
RBC: 4.13 MIL/uL (ref 3.87–5.11)
RDW: 14 % (ref 11.5–15.5)
WBC: 3.4 10*3/uL — ABNORMAL LOW (ref 4.0–10.5)
nRBC: 0 % (ref 0.0–0.2)

## 2022-04-25 LAB — MAGNESIUM: Magnesium: 2 mg/dL (ref 1.7–2.4)

## 2022-04-25 LAB — PHOSPHORUS: Phosphorus: 3.7 mg/dL (ref 2.5–4.6)

## 2022-04-26 LAB — TACROLIMUS LEVEL: Tacrolimus (FK506) - LabCorp: 7.1 ng/mL (ref 2.0–20.0)

## 2022-04-27 ENCOUNTER — Other Ambulatory Visit (INDEPENDENT_AMBULATORY_CARE_PROVIDER_SITE_OTHER): Payer: Medicare Other

## 2022-04-27 DIAGNOSIS — E113599 Type 2 diabetes mellitus with proliferative diabetic retinopathy without macular edema, unspecified eye: Secondary | ICD-10-CM | POA: Diagnosis not present

## 2022-04-27 LAB — BK QUANT PCR (PLASMA/SERUM)
BK Quantitaion PCR: 1440 IU/mL
Log10 BK Qn PCR: 3.158 log10 IU/mL

## 2022-04-28 LAB — GLUCOSE, RANDOM: Glucose, Bld: 220 mg/dL — ABNORMAL HIGH (ref 70–99)

## 2022-04-28 LAB — FRUCTOSAMINE: Fructosamine: 363 umol/L — ABNORMAL HIGH (ref 0–285)

## 2022-05-02 ENCOUNTER — Encounter: Payer: Self-pay | Admitting: Endocrinology

## 2022-05-02 ENCOUNTER — Ambulatory Visit (INDEPENDENT_AMBULATORY_CARE_PROVIDER_SITE_OTHER): Payer: Medicare Other | Admitting: Endocrinology

## 2022-05-02 VITALS — BP 118/72 | HR 65 | Ht 62.0 in | Wt 112.8 lb

## 2022-05-02 DIAGNOSIS — E1165 Type 2 diabetes mellitus with hyperglycemia: Secondary | ICD-10-CM

## 2022-05-02 DIAGNOSIS — N184 Chronic kidney disease, stage 4 (severe): Secondary | ICD-10-CM | POA: Diagnosis not present

## 2022-05-02 DIAGNOSIS — E1122 Type 2 diabetes mellitus with diabetic chronic kidney disease: Secondary | ICD-10-CM

## 2022-05-02 DIAGNOSIS — Z794 Long term (current) use of insulin: Secondary | ICD-10-CM

## 2022-05-02 LAB — POCT GLYCOSYLATED HEMOGLOBIN (HGB A1C): Hemoglobin A1C: 7.4 % — AB (ref 4.0–5.6)

## 2022-05-02 MED ORDER — INSULIN LISPRO (1 UNIT DIAL) 100 UNIT/ML (KWIKPEN): PEN_INJECTOR | SUBCUTANEOUS | Status: DC

## 2022-05-02 MED ORDER — TRESIBA FLEXTOUCH 100 UNIT/ML ~~LOC~~ SOPN: 10.0000 [IU] | PEN_INJECTOR | Freq: Every day | SUBCUTANEOUS | 1 refills | Status: DC

## 2022-05-02 NOTE — Progress Notes (Signed)
Patient ID: [name], female   DOB: 02-15-1950, 72 y.o.   MRN: [id number]           Reason for Appointment: Type II Diabetes follow-up   History of Present Illness   Diagnosis date: 2007  Previous history:  Not been able to continue metformin because of renal failure since 2020 She is instead been taking repaglinide 2 mg since 2021 and acarbose since 2022 In the past her A1c has ranged between 6.08.3 with the highest in 2021  Recent history:     Non-insulin hypoglycemic drugs: None         Insulin: Humulin N 8 units in the morning, Humalog sliding scale 0-8 units with meals    Side effects from medications: None  Current self management, blood sugar patterns and problems identified:  A1c is now 7.4, likely more representative since she has had a renal transplant  Her home blood sugars are being checked with a Dexcom sensor which was started when she had heart transplant in [place] However unable to connect her device and only the last 24 hours information is available She apparently was switched to insulin after her transplant likely because of taking prednisone She does appear to be having high blood sugars after meals and also this morning She takes only correction doses of Humalog at mealtime and this does not cover her meals especially after lunch, also taking prednisone 5 mg in the morning Not clear why she is only taking NPH once a day and fasting reading at least today was high No hypoglycemia with blood sugar was 80 before dinner Because of heart transplant surgery she appears to have lost significant amount of weight  Blood sugars from yesterday and today:  PRE-MEAL Fasting Lunch Dinner Bedtime Overall  Glucose range: 229  80    Mean/median:        POST-MEAL PC Breakfast PC Lunch PC Dinner  Glucose range:  264, 291   Mean/median:        Diet management: She eats 2-3 meals a day with generally eggs, toast and meat in the morning and a protein  and vegetables and a starch at lunch and dinner               Dietician consultations with dialysis nutritionist only     Weight control:  Wt Readings from Last 3 Encounters:  05/02/22 112 lb 12.8 oz (51.2 kg)  02/22/22 122 lb 3.2 oz (55.4 kg)  01/31/22 121 lb 9.6 oz (55.2 kg)            Diabetes labs:  Lab Results  Component Value Date   HGBA1C 7.4 (A) 05/02/2022   HGBA1C 4.9 01/28/2022   HGBA1C 5.2 11/23/2021   Lab Results  Component Value Date   MICROALBUR 12.8 (H) 08/28/2015   LDLCALC 80 07/11/2018   CREATININE 1.10 (H) 04/25/2022   Lab Results  Component Value Date   FRUCTOSAMINE 363 (H) 04/27/2022   FRUCTOSAMINE 315 (H) 01/28/2022   FRUCTOSAMINE 294 (H) 09/29/2021     Allergies as of 05/02/2022       Reactions   Tape Other (See Comments)   Paper tape rips skin.    Semaglutide Rash   Rybelsus 3 mg        Medication List        Accurate as of May 02, 2022  4:11 PM. If you have any questions, ask your nurse or doctor.          STOP  taking these medications    Insulin NPH (Human) (Isophane) 100 UNIT/ML Kiwkpen Commonly known as: HUMULIN N Stopped by: Reather Littler, MD       TAKE these medications    acetaminophen 500 MG tablet Commonly known as: TYLENOL Take 1,000 mg by mouth every 6 (six) hours as needed for moderate pain.   acetaminophen 325 MG tablet Commonly known as: TYLENOL Take 2 tablets by mouth every 6 (six) hours as needed.   allopurinol 100 MG tablet Commonly known as: ZYLOPRIM Take 1 tablet (100 mg total) by mouth daily.   amLODipine 10 MG tablet Commonly known as: NORVASC TAKE 1 TABLET BY MOUTH EVERY DAY   atorvastatin 80 MG tablet Commonly known as: LIPITOR TAKE 1 TABLET BY MOUTH EVERY DAY   atorvastatin 40 MG tablet Commonly known as: LIPITOR Take 1 tablet by mouth at bedtime.   blood glucose meter kit and supplies Kit Dispense based on patient and insurance preference. Use up to four times daily as directed.    bromocriptine 2.5 MG tablet Commonly known as: PARLODEL Take 0.5 tablets (1.25 mg total) by mouth daily.   Calcium Acetate 667 MG Tabs Take 1 tablet by mouth daily.   Dexcom G7 Receiver Devi Used to monitor blood sugar. Replace once per year.   Dexcom G7 Sensor Misc Use to monitor blood sugar. Replace sensor every 10 days.   Dialyvite 800-Zinc 15 0.8 MG Tabs Take 1 tablet by mouth daily.   epoetin alfa-epbx 40000 UNIT/ML injection Commonly known as: RETACRIT Inject 40,000 Units into the skin every 28 (twenty-eight) days.   hydrALAZINE 50 MG tablet Commonly known as: APRESOLINE Take 1 tablet (50 mg total) by mouth every 8 (eight) hours. What changed: when to take this   insulin lispro 100 UNIT/ML KwikPen Commonly known as: HUMALOG Inject under the skin 3 times daily before meals as per sliding scale: If bloos sugar is Less than 70:   Treat hypoglycemia  70-140 inject 0 units 141-180 inject 1 unit 181-220 inject 3 units 221-260 inject 4 units 261-300 inject 5 units 301-340 inject 7 units higher than 340 inject 8 units and contact LIP. (Discard pen 28 days after first use)   isosorbide mononitrate 30 MG 24 hr tablet Commonly known as: IMDUR Take 1 tablet (30 mg total) by mouth daily.   Januvia 25 MG tablet Generic drug: sitaGLIPtin TAKE 1 TABLET (25 MG TOTAL) BY MOUTH DAILY.   lidocaine 5 % Commonly known as: LIDODERM Place 1 patch onto the skin every 12 (twelve) hours.   lidocaine-prilocaine cream Commonly known as: EMLA Apply 1 Application topically See admin instructions. Three times a week   magnesium oxide 400 MG tablet Commonly known as: MAG-OX Take 400 mg by mouth daily.   metoprolol succinate 100 MG 24 hr tablet Commonly known as: TOPROL-XL Take 100 mg by mouth daily.   mycophenolate 250 MG capsule Commonly known as: CELLCEPT Take 250 mg by mouth 2 (two) times daily. Take 4 capsules twice daily   NIFEdipine 60 MG 24 hr tablet Commonly known  as: PROCARDIA XL/NIFEDICAL XL Take 1 tablet by mouth 2 (two) times daily.   OneTouch Delica Lancets 33G Misc 1 each by Does not apply route 2 (two) times daily. E11.9   OneTouch Verio Flex System w/Device Kit 1 each by Does not apply route 2 (two) times daily. E11.9   OneTouch Verio test strip Generic drug: glucose blood USE AS DIRECTED TWICE DAILY   oxyCODONE 5 MG immediate release tablet Commonly  known as: Oxy IR/ROXICODONE Take 1 tablet by mouth every 6 (six) hours as needed.   oxyCODONE-acetaminophen 5-325 MG tablet Commonly known as: Percocet Take 1 tablet by mouth every 6 (six) hours as needed for severe pain.   pantoprazole 40 MG tablet Commonly known as: PROTONIX Take 1 tablet (40 mg total) by mouth daily.   polyethylene glycol 17 g packet Commonly known as: MIRALAX / GLYCOLAX Take 34-51 g by mouth daily as needed for mild constipation.   predniSONE 5 MG tablet Commonly known as: DELTASONE Take 3 tablets by mouth daily.   senna 8.6 MG tablet Commonly known as: SENOKOT Take 2 tablets by mouth 2 (two) times daily as needed.   sulfamethoxazole-trimethoprim 400-80 MG tablet Commonly known as: BACTRIM Take 1 tablet by mouth daily.   tacrolimus 1 MG capsule Commonly known as: PROGRAF Take 6 capsules by mouth.   Evaristo Bury FlexTouch 100 UNIT/ML FlexTouch Pen Generic drug: insulin degludec Inject 10 Units into the skin daily. Started by: Reather Littler, MD   valGANciclovir 450 MG tablet Commonly known as: VALCYTE Take 1 tablet by mouth every 12 (twelve) hours.   Vitamin D (Ergocalciferol) 1.25 MG (50000 UNIT) Caps capsule Commonly known as: DRISDOL Take 50,000 Units by mouth once a week. For 8 days        Allergies:  Allergies  Allergen Reactions   Tape Other (See Comments)    Paper tape rips skin.    Semaglutide Rash    Rybelsus 3 mg    Past Medical History:  Diagnosis Date   Arthritis    Chronic back pain    Chronic kidney disease (CKD)     Constipation    Cough    Diabetes mellitus, type 2    Diverticulosis    Fibroid    patient thinks this was the reason for her hysterectomy   GERD (gastroesophageal reflux disease)    Heart murmur    History of sebaceous cyst    Hyperlipemia    Hyperplastic colon polyp    Hypertension    Hyperthyroidism    Lumbar radiculopathy    Shortness of breath 09/13/2013   Stroke 2015?   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    Past Surgical History:  Procedure Laterality Date   ABDOMINAL HYSTERECTOMY     --?ovaries remain   AV FISTULA PLACEMENT Left 07/16/2020   Procedure: LEFT BRACHIOCEPHALIC ARTERIOVENOUS (AV) FISTULA CREATION;  Surgeon: Leonie Douglas, MD;  Location: [facility] OR;  Service: Vascular;  Laterality: Left;  PERIPHERAL NERVE BLOCK   COLONOSCOPY W/ BIOPSIES  09/11/2008   ESOPHAGOGASTRODUODENOSCOPY (EGD) WITH PROPOFOL N/A 05/20/2020   Procedure: ESOPHAGOGASTRODUODENOSCOPY (EGD) WITH PROPOFOL;  Surgeon: Hilarie Fredrickson, MD;  Location: [facility] ENDOSCOPY;  Service: Endoscopy;  Laterality: N/A;   GIVENS CAPSULE STUDY N/A 06/28/2020   Procedure: GIVENS CAPSULE STUDY;  Surgeon: Hilarie Fredrickson, MD;  Location: [facility] ENDOSCOPY;  Service: Endoscopy;  Laterality: N/A;   RETINOPATHY SURGERY Bilateral 2013   Dr. Ashley Royalty    Family History  Problem Relation Age of Onset   Hypertension Mother    Sleep apnea Mother    Diabetes Mother    Hyperlipidemia Mother    Lung cancer Father        smoker   Hypertension Sister    Diabetes Sister    Hypertension Brother    Hypertension Sister    Diabetes Brother    Hypertension Brother  Breast cancer Neg Hx    Colon cancer Neg Hx    Esophageal cancer Neg Hx    Pancreatic cancer Neg Hx    Liver disease Neg Hx    Colon polyps Neg Hx     Social History:  reports that she has been smoking cigarettes. She has a 17.50 pack-year smoking history. She has never used smokeless tobacco. She reports that she  does not drink alcohol and does not use drugs.  Review of Systems:  Last diabetic eye exam date Dr Oretha Milch  Last foot exam date:6/22  Hypertension: Managed by nephrologist, now taking nifedipine instead of amlodipine  BP Readings from Last 3 Encounters:  05/02/22 118/72  02/22/22 (!) 138/58  01/31/22 (!) 142/62   She has had a kidney transplant which appears to be successful with only minimal increase in creatinine recently  Lipids:  followed by PCP on atorvastatin 80 mg No history of coronary artery disease, may have PVD  LDL is last 69 from her PCP    Lab Results  Component Value Date   CHOL 155 07/11/2018   CHOL 157 07/10/2017   CHOL 141 01/11/2016   Lab Results  Component Value Date   HDL 44.70 07/11/2018   HDL 39.40 07/10/2017   HDL 38.40 (L) 01/11/2016   Lab Results  Component Value Date   LDLCALC 80 07/11/2018   LDLCALC 84 01/11/2016   LDLCALC 75 01/15/2015   Lab Results  Component Value Date   TRIG 151.0 (H) 07/11/2018   TRIG 270.0 (H) 07/10/2017   TRIG 95.0 01/11/2016   Lab Results  Component Value Date   CHOLHDL 3 07/11/2018   CHOLHDL 4 07/10/2017   CHOLHDL 4 01/11/2016   Lab Results  Component Value Date   LDLDIRECT 69.0 09/29/2021   LDLDIRECT 79.0 07/10/2017   LDLDIRECT 130.4 12/13/2012    No history of thyroid dysfunction but in 2018 had a 1.8 cm right thyroid nodule which was benign on biopsy  Lab Results  Component Value Date   TSH 1.83 09/24/2019   TSH 1.854 09/09/2017   TSH 1.56 09/08/2017     Examination:   BP 118/72 (BP Location: Left Arm, Patient Position: Sitting, Cuff Size: Small)   Pulse 65   Ht 5\' 2"  (1.575 m)   Wt 112 lb 12.8 oz (51.2 kg)   SpO2 95%   BMI 20.63 kg/m   Body mass index is 20.63 kg/m.    ASSESSMENT/ PLAN:    Diabetes type 2 non-insulin-dependent:   Current regimen: NPH insulin in the morning and Humalog sliding scale  Her A1c has been relatively low previously but inaccurate because of  renal failure and now 7.4  Her insulin regimen was reviewed in detail Blood sugar patterns are difficult to assess as the Dexcom could not be downloaded  Recent blood sugars appear to indicate that NPH is not lasting 24 hours and she is not taking any meal coverage Discussed in detail the differences between basal and bolus insulin and need for getting a true basal insulin as well as mealtime Humalog coverage regardless of Premeal blood sugar Patient had difficulty understanding the differences but this was discussed and explained in detail and written instructions given  TRESIBA: Discussed how basal insulin works, timing of injection, dosage, injection sites.   Also discussed titration based on fasting blood sugar every 3 days by 1 unit and at target of 90-130 for fasting reading.   She will let us know if she has consistently high  or low overnight readings She will continue 8 units of basal insulin but likely may need more She will stop NPH completely  Also discussed in detail the use of Humalog mealtime insulin to cover postprandial spikes, action of the Humalog insulin,  timing and action of the rapid acting insulin as well as starting dose of 3 units and dosage titration by about 1 unit to target the two-hour reading of under 180 Handout on mealtime insulin given  Follow-up to be done by diabetes educator  Patient Instructions   TRESIBA insulin: This insulin provides blood sugar control for up to 24 hours.    Start with 8 units IN AM daily and increase by  units every 3 days until the waking up sugars are under 130. Then continue the same dose.  If blood sugar is under 90 for 2 days in a row, reduce the dose by 2 units.  Note that this insulin does not control the rise of blood sugar with meals   3  STOP HUMULIN N INSULIN  HUMALOG  3 UNITS BEFORE EACH   IF sugar after meals is over 200 go up to 4 units Humalog  Total visit time including counseling = 30 minutes  Reather LittlerAjay  Onaje Warne 05/02/2022, 4:11 PM

## 2022-05-02 NOTE — Patient Instructions (Addendum)
TRESIBA insulin: This insulin provides blood sugar control for up to 24 hours.    Start with 8 units IN AM daily and increase by  units every 3 days until the waking up sugars are under 130. Then continue the same dose.  If blood sugar is under 90 for 2 days in a row, reduce the dose by 2 units.  Note that this insulin does not control the rise of blood sugar with meals   3  STOP HUMULIN N INSULIN  HUMALOG  3 UNITS BEFORE EACH   IF sugar after meals is over 200 go up to 4 units Humalog

## 2022-05-03 ENCOUNTER — Other Ambulatory Visit (HOSPITAL_COMMUNITY)
Admission: RE | Admit: 2022-05-03 | Discharge: 2022-05-03 | Disposition: A | Discharge status: Home / Self Care | Payer: Medicare Other | Source: Ambulatory Visit | Attending: Physician Assistant | Admitting: Physician Assistant

## 2022-05-03 DIAGNOSIS — Z940 Kidney transplant status: Secondary | ICD-10-CM | POA: Diagnosis present

## 2022-05-03 LAB — COMPREHENSIVE METABOLIC PANEL
ALT: 32 U/L (ref 0–44)
AST: 28 U/L (ref 15–41)
Albumin: 3.7 g/dL (ref 3.5–5.0)
Alkaline Phosphatase: 82 U/L (ref 38–126)
Anion gap: 5 (ref 5–15)
BUN: 24 mg/dL — ABNORMAL HIGH (ref 8–23)
CO2: 25 mmol/L (ref 22–32)
Calcium: 9.6 mg/dL (ref 8.9–10.3)
Chloride: 106 mmol/L (ref 98–111)
Creatinine, Ser: 1.42 mg/dL — ABNORMAL HIGH (ref 0.44–1.00)
GFR, Estimated: 39 mL/min — ABNORMAL LOW (ref >60)
Glucose, Bld: 208 mg/dL — ABNORMAL HIGH (ref 70–99)
Potassium: 4.2 mmol/L (ref 3.5–5.1)
Sodium: 136 mmol/L (ref 135–145)
Total Bilirubin: 0.8 mg/dL (ref 0.3–1.2)
Total Protein: 6.5 g/dL (ref 6.5–8.1)

## 2022-05-03 LAB — CBC WITH DIFFERENTIAL/PLATELET
Abs Immature Granulocytes: 0.04 10*3/uL (ref 0.00–0.07)
Basophils Absolute: 0.1 10*3/uL (ref 0.0–0.1)
Basophils Relative: 1 %
Eosinophils Absolute: 0.1 10*3/uL (ref 0.0–0.5)
Eosinophils Relative: 2 %
HCT: 39.5 % (ref 36.0–46.0)
Hemoglobin: 13.3 g/dL (ref 12.0–15.0)
Immature Granulocytes: 1 %
Lymphocytes Relative: 17 %
Lymphs Abs: 0.6 10*3/uL — ABNORMAL LOW (ref 0.7–4.0)
MCH: 33.1 pg (ref 26.0–34.0)
MCHC: 33.7 g/dL (ref 30.0–36.0)
MCV: 98.3 fL (ref 80.0–100.0)
Monocytes Absolute: 0.2 10*3/uL (ref 0.1–1.0)
Monocytes Relative: 7 %
Neutro Abs: 2.7 10*3/uL (ref 1.7–7.7)
Neutrophils Relative %: 72 %
Platelets: 193 10*3/uL (ref 150–400)
RBC: 4.02 MIL/uL (ref 3.87–5.11)
RDW: 13.4 % (ref 11.5–15.5)
WBC: 3.7 10*3/uL — ABNORMAL LOW (ref 4.0–10.5)
nRBC: 0 % (ref 0.0–0.2)

## 2022-05-03 LAB — PHOSPHORUS: Phosphorus: 3.5 mg/dL (ref 2.5–4.6)

## 2022-05-03 LAB — MAGNESIUM: Magnesium: 2.2 mg/dL (ref 1.7–2.4)

## 2022-05-04 LAB — BK QUANT PCR (PLASMA/SERUM)
BK Quantitaion PCR: 1280 IU/mL
Log10 BK Qn PCR: 3.107 log10 IU/mL

## 2022-05-04 LAB — TACROLIMUS LEVEL: Tacrolimus (FK506) - LabCorp: 19.3 ng/mL (ref 2.0–20.0)

## 2022-05-10 ENCOUNTER — Other Ambulatory Visit (HOSPITAL_COMMUNITY)
Admission: AD | Admit: 2022-05-10 | Discharge: 2022-05-10 | Disposition: A | Discharge status: Home / Self Care | Payer: Medicare Other | Source: Ambulatory Visit | Attending: Physician Assistant | Admitting: Physician Assistant

## 2022-05-10 ENCOUNTER — Ambulatory Visit: Payer: Medicare Other | Admitting: Nutrition

## 2022-05-10 DIAGNOSIS — Z940 Kidney transplant status: Secondary | ICD-10-CM | POA: Diagnosis present

## 2022-05-10 LAB — COMPREHENSIVE METABOLIC PANEL
ALT: 44 U/L (ref 0–44)
AST: 41 U/L (ref 15–41)
Albumin: 3.9 g/dL (ref 3.5–5.0)
Alkaline Phosphatase: 74 U/L (ref 38–126)
Anion gap: 11 (ref 5–15)
BUN: 24 mg/dL — ABNORMAL HIGH (ref 8–23)
CO2: 22 mmol/L (ref 22–32)
Calcium: 9.8 mg/dL (ref 8.9–10.3)
Chloride: 104 mmol/L (ref 98–111)
Creatinine, Ser: 1.14 mg/dL — ABNORMAL HIGH (ref 0.44–1.00)
GFR, Estimated: 51 mL/min — ABNORMAL LOW (ref >60)
Glucose, Bld: 186 mg/dL — ABNORMAL HIGH (ref 70–99)
Potassium: 4 mmol/L (ref 3.5–5.1)
Sodium: 137 mmol/L (ref 135–145)
Total Bilirubin: 0.6 mg/dL (ref 0.3–1.2)
Total Protein: 6.8 g/dL (ref 6.5–8.1)

## 2022-05-10 LAB — CBC WITH DIFFERENTIAL/PLATELET
Abs Immature Granulocytes: 0.08 10*3/uL — ABNORMAL HIGH (ref 0.00–0.07)
Basophils Absolute: 0.1 10*3/uL (ref 0.0–0.1)
Basophils Relative: 1 %
Eosinophils Absolute: 0.1 10*3/uL (ref 0.0–0.5)
Eosinophils Relative: 2 %
HCT: 40.1 % (ref 36.0–46.0)
Hemoglobin: 13.4 g/dL (ref 12.0–15.0)
Immature Granulocytes: 2 %
Lymphocytes Relative: 17 %
Lymphs Abs: 0.6 10*3/uL — ABNORMAL LOW (ref 0.7–4.0)
MCH: 33.7 pg (ref 26.0–34.0)
MCHC: 33.4 g/dL (ref 30.0–36.0)
MCV: 100.8 fL — ABNORMAL HIGH (ref 80.0–100.0)
Monocytes Absolute: 0.3 10*3/uL (ref 0.1–1.0)
Monocytes Relative: 8 %
Neutro Abs: 2.5 10*3/uL (ref 1.7–7.7)
Neutrophils Relative %: 70 %
Platelets: 197 10*3/uL (ref 150–400)
RBC: 3.98 MIL/uL (ref 3.87–5.11)
RDW: 14 % (ref 11.5–15.5)
WBC: 3.7 10*3/uL — ABNORMAL LOW (ref 4.0–10.5)
nRBC: 0 % (ref 0.0–0.2)

## 2022-05-10 LAB — PHOSPHORUS: Phosphorus: 3.4 mg/dL (ref 2.5–4.6)

## 2022-05-10 LAB — MAGNESIUM: Magnesium: 1.9 mg/dL (ref 1.7–2.4)

## 2022-05-11 ENCOUNTER — Encounter: Payer: Medicare Other | Attending: Endocrinology | Admitting: Nutrition

## 2022-05-11 VITALS — Wt 112.3 lb

## 2022-05-11 DIAGNOSIS — E113599 Type 2 diabetes mellitus with proliferative diabetic retinopathy without macular edema, unspecified eye: Secondary | ICD-10-CM | POA: Insufficient documentation

## 2022-05-11 LAB — BK QUANT PCR (PLASMA/SERUM)
BK Quantitaion PCR: 1850 IU/mL
Log10 BK Qn PCR: 3.267 log10 IU/mL

## 2022-05-11 NOTE — Patient Instructions (Signed)
Take Humalog 10-15 minutes before the meal. Increase the dose of Humalog to 4u when having 3 cookies after lunch, or supper.   If having 2 scoops of ice cream, or whole bag of chips, take 2 extra units of Humalog either before the snack or before the meal which you are having the snack. Take 2 extra units of Humalog before the meal you are drinking 8 ounces of regular soda Switch to diet Sprite or any diet soda you prefer.

## 2022-05-11 NOTE — Progress Notes (Signed)
Patient is here today to review her blood sugar readings and insulin doses.  She is currently taking Tresiba 8u QAM (9-10AM), and Humalog 3u pc meals.   SBGM:  Did not bring her reader.  Says FBSs: 80-130, except this AM it was 140, because she had cookies after supper. Exercise:  none planned  ADL Diet History:   8AM up  drinks black coffee 9AM:  2 pieces of [place] bacon, 1-2 eggs, 2 pieces of whole wheat toast with butter, water to drink 1-2 Lunch: 2 days she has a salad with cheese and 1/2 sleeve of crackers- 15-20. With water to drink                  Or [place] sandwhich with mustard and small bag of chips and water to drink                  Or left overs from supper--3 ounces protein (3 chicken strips and one non starchy veg, and one starchy veg.  4PM: 6 cheese crackers sometimes will have 8 ounces of regular soda 6PM: supper:  3-4 ounces protein, usually baked, but 1-2X/wk.  Has fried fish or fried chicken, with one starchy veg., and on non starchy veg..  sometimes cornbread with supper-1X/wk.  Usually will not snack after super.  Snacking is usually done after lunch. Discussion:  Always take Humalog 10-15 minutes before eating.  Discussed timing of insulin and need to take this before eating. Need to adjust the Humalog dose based on how much carb she is eating, and her resulting blood sugar rise.  Discussed what carbs are and need for more Humalog when eating high carb meals like pasta, or having a desert.  Will start with 1 extra unit, and if blood sugar is still high (over 170, 2hr. Pc) will take 1 more until when eating this meal or snack again.  Need to take 2u of insulin when eating cookies and ice cream, or whole bag of popcorn.

## 2022-05-12 LAB — TACROLIMUS LEVEL: Tacrolimus (FK506) - LabCorp: 5.2 ng/mL (ref 2.0–20.0)

## 2022-05-16 ENCOUNTER — Other Ambulatory Visit (HOSPITAL_COMMUNITY)
Admission: AD | Admit: 2022-05-16 | Discharge: 2022-05-16 | Disposition: A | Discharge status: Home / Self Care | Payer: Medicare Other | Source: Ambulatory Visit | Attending: Internal Medicine | Admitting: Internal Medicine

## 2022-05-16 DIAGNOSIS — Z940 Kidney transplant status: Secondary | ICD-10-CM | POA: Diagnosis present

## 2022-05-16 LAB — COMPREHENSIVE METABOLIC PANEL
ALT: 50 U/L — ABNORMAL HIGH (ref 0–44)
AST: 35 U/L (ref 15–41)
Albumin: 3.9 g/dL (ref 3.5–5.0)
Alkaline Phosphatase: 75 U/L (ref 38–126)
Anion gap: 10 (ref 5–15)
BUN: 21 mg/dL (ref 8–23)
CO2: 24 mmol/L (ref 22–32)
Calcium: 9.7 mg/dL (ref 8.9–10.3)
Chloride: 103 mmol/L (ref 98–111)
Creatinine, Ser: 1.07 mg/dL — ABNORMAL HIGH (ref 0.44–1.00)
GFR, Estimated: 55 mL/min — ABNORMAL LOW (ref >60)
Glucose, Bld: 147 mg/dL — ABNORMAL HIGH (ref 70–99)
Potassium: 3.9 mmol/L (ref 3.5–5.1)
Sodium: 137 mmol/L (ref 135–145)
Total Bilirubin: 0.7 mg/dL (ref 0.3–1.2)
Total Protein: 7.1 g/dL (ref 6.5–8.1)

## 2022-05-16 LAB — CBC WITH DIFFERENTIAL/PLATELET
Abs Immature Granulocytes: 0.13 10*3/uL — ABNORMAL HIGH (ref 0.00–0.07)
Basophils Absolute: 0.1 10*3/uL (ref 0.0–0.1)
Basophils Relative: 2 %
Eosinophils Absolute: 0.1 10*3/uL (ref 0.0–0.5)
Eosinophils Relative: 2 %
HCT: 39.6 % (ref 36.0–46.0)
Hemoglobin: 12.8 g/dL (ref 12.0–15.0)
Immature Granulocytes: 4 %
Lymphocytes Relative: 19 %
Lymphs Abs: 0.6 10*3/uL — ABNORMAL LOW (ref 0.7–4.0)
MCH: 33.8 pg (ref 26.0–34.0)
MCHC: 32.3 g/dL (ref 30.0–36.0)
MCV: 104.5 fL — ABNORMAL HIGH (ref 80.0–100.0)
Monocytes Absolute: 0.3 10*3/uL (ref 0.1–1.0)
Monocytes Relative: 9 %
Neutro Abs: 2 10*3/uL (ref 1.7–7.7)
Neutrophils Relative %: 64 %
Platelets: 206 10*3/uL (ref 150–400)
RBC: 3.79 MIL/uL — ABNORMAL LOW (ref 3.87–5.11)
RDW: 14 % (ref 11.5–15.5)
WBC: 3.1 10*3/uL — ABNORMAL LOW (ref 4.0–10.5)
nRBC: 0 % (ref 0.0–0.2)

## 2022-05-16 LAB — MAGNESIUM: Magnesium: 2 mg/dL (ref 1.7–2.4)

## 2022-05-16 LAB — PHOSPHORUS: Phosphorus: 3.6 mg/dL (ref 2.5–4.6)

## 2022-05-17 LAB — BK QUANT PCR (PLASMA/SERUM)
BK Quantitaion PCR: 1940 IU/mL
Log10 BK Qn PCR: 3.288 log10 IU/mL

## 2022-05-17 LAB — TACROLIMUS LEVEL: Tacrolimus (FK506) - LabCorp: 3.6 ng/mL (ref 2.0–20.0)

## 2022-05-30 ENCOUNTER — Other Ambulatory Visit (HOSPITAL_COMMUNITY)
Admission: RE | Admit: 2022-05-30 | Discharge: 2022-05-30 | Disposition: A | Discharge status: Home / Self Care | Payer: Medicare Other | Source: Ambulatory Visit | Attending: Neurology | Admitting: Neurology

## 2022-05-30 DIAGNOSIS — Z940 Kidney transplant status: Secondary | ICD-10-CM | POA: Diagnosis present

## 2022-05-30 DIAGNOSIS — Z79899 Other long term (current) drug therapy: Secondary | ICD-10-CM | POA: Insufficient documentation

## 2022-05-30 LAB — CBC WITH DIFFERENTIAL/PLATELET
Abs Immature Granulocytes: 0 10*3/uL (ref 0.00–0.07)
Basophils Absolute: 0.1 10*3/uL (ref 0.0–0.1)
Basophils Relative: 3 %
Eosinophils Absolute: 0.1 10*3/uL (ref 0.0–0.5)
Eosinophils Relative: 2 %
HCT: 42.2 % (ref 36.0–46.0)
Hemoglobin: 14 g/dL (ref 12.0–15.0)
Lymphocytes Relative: 19 %
Lymphs Abs: 0.6 10*3/uL — ABNORMAL LOW (ref 0.7–4.0)
MCH: 33.7 pg (ref 26.0–34.0)
MCHC: 33.2 g/dL (ref 30.0–36.0)
MCV: 101.7 fL — ABNORMAL HIGH (ref 80.0–100.0)
Monocytes Absolute: 0.3 10*3/uL (ref 0.1–1.0)
Monocytes Relative: 10 %
Neutro Abs: 2 10*3/uL (ref 1.7–7.7)
Neutrophils Relative %: 66 %
Platelets: 211 10*3/uL (ref 150–400)
RBC: 4.15 MIL/uL (ref 3.87–5.11)
RDW: 13.2 % (ref 11.5–15.5)
WBC: 3 10*3/uL — ABNORMAL LOW (ref 4.0–10.5)
nRBC: 0 % (ref 0.0–0.2)
nRBC: 0 /100 WBC

## 2022-05-30 LAB — COMPREHENSIVE METABOLIC PANEL
ALT: 58 U/L — ABNORMAL HIGH (ref 0–44)
AST: 32 U/L (ref 15–41)
Albumin: 4 g/dL (ref 3.5–5.0)
Alkaline Phosphatase: 78 U/L (ref 38–126)
Anion gap: 10 (ref 5–15)
BUN: 23 mg/dL (ref 8–23)
CO2: 25 mmol/L (ref 22–32)
Calcium: 9.9 mg/dL (ref 8.9–10.3)
Chloride: 102 mmol/L (ref 98–111)
Creatinine, Ser: 1.13 mg/dL — ABNORMAL HIGH (ref 0.44–1.00)
GFR, Estimated: 52 mL/min — ABNORMAL LOW (ref >60)
Glucose, Bld: 153 mg/dL — ABNORMAL HIGH (ref 70–99)
Potassium: 4.6 mmol/L (ref 3.5–5.1)
Sodium: 137 mmol/L (ref 135–145)
Total Bilirubin: 0.6 mg/dL (ref 0.3–1.2)
Total Protein: 7.1 g/dL (ref 6.5–8.1)

## 2022-05-30 LAB — PHOSPHORUS: Phosphorus: 3.8 mg/dL (ref 2.5–4.6)

## 2022-05-30 LAB — MAGNESIUM: Magnesium: 2.1 mg/dL (ref 1.7–2.4)

## 2022-06-02 LAB — TACROLIMUS LEVEL: Tacrolimus (FK506) - LabCorp: 6.2 ng/mL (ref 2.0–20.0)

## 2022-06-13 ENCOUNTER — Other Ambulatory Visit (HOSPITAL_COMMUNITY)
Admission: RE | Admit: 2022-06-13 | Discharge: 2022-06-13 | Disposition: A | Discharge status: Home / Self Care | Payer: Medicare Other | Source: Ambulatory Visit | Attending: Physician Assistant | Admitting: Physician Assistant

## 2022-06-13 DIAGNOSIS — Z940 Kidney transplant status: Secondary | ICD-10-CM | POA: Diagnosis present

## 2022-06-13 LAB — COMPREHENSIVE METABOLIC PANEL
ALT: 60 U/L — ABNORMAL HIGH (ref 0–44)
AST: 37 U/L (ref 15–41)
Albumin: 3.7 g/dL (ref 3.5–5.0)
Alkaline Phosphatase: 83 U/L (ref 38–126)
Anion gap: 8 (ref 5–15)
BUN: 16 mg/dL (ref 8–23)
CO2: 26 mmol/L (ref 22–32)
Calcium: 9.6 mg/dL (ref 8.9–10.3)
Chloride: 106 mmol/L (ref 98–111)
Creatinine, Ser: 1.16 mg/dL — ABNORMAL HIGH (ref 0.44–1.00)
GFR, Estimated: 50 mL/min — ABNORMAL LOW (ref >60)
Glucose, Bld: 156 mg/dL — ABNORMAL HIGH (ref 70–99)
Potassium: 4.3 mmol/L (ref 3.5–5.1)
Sodium: 140 mmol/L (ref 135–145)
Total Bilirubin: 0.5 mg/dL (ref 0.3–1.2)
Total Protein: 6.8 g/dL (ref 6.5–8.1)

## 2022-06-13 LAB — CBC WITH DIFFERENTIAL/PLATELET
Abs Immature Granulocytes: 0 10*3/uL (ref 0.00–0.07)
Basophils Absolute: 0.1 10*3/uL (ref 0.0–0.1)
Basophils Relative: 5 %
Eosinophils Absolute: 0.1 10*3/uL (ref 0.0–0.5)
Eosinophils Relative: 4 %
HCT: 42.9 % (ref 36.0–46.0)
Hemoglobin: 14 g/dL (ref 12.0–15.0)
Lymphocytes Relative: 18 %
Lymphs Abs: 0.5 10*3/uL — ABNORMAL LOW (ref 0.7–4.0)
MCH: 33.5 pg (ref 26.0–34.0)
MCHC: 32.6 g/dL (ref 30.0–36.0)
MCV: 102.6 fL — ABNORMAL HIGH (ref 80.0–100.0)
Monocytes Absolute: 0.2 10*3/uL (ref 0.1–1.0)
Monocytes Relative: 9 %
Neutro Abs: 1.7 10*3/uL (ref 1.7–7.7)
Neutrophils Relative %: 64 %
Platelets: 193 10*3/uL (ref 150–400)
RBC: 4.18 MIL/uL (ref 3.87–5.11)
RDW: 13.3 % (ref 11.5–15.5)
WBC: 2.6 10*3/uL — ABNORMAL LOW (ref 4.0–10.5)
nRBC: 0 % (ref 0.0–0.2)
nRBC: 0 /100 WBC

## 2022-06-13 LAB — PHOSPHORUS: Phosphorus: 3.8 mg/dL (ref 2.5–4.6)

## 2022-06-13 LAB — MAGNESIUM: Magnesium: 2 mg/dL (ref 1.7–2.4)

## 2022-06-14 ENCOUNTER — Ambulatory Visit (INDEPENDENT_AMBULATORY_CARE_PROVIDER_SITE_OTHER): Payer: Medicare Other | Admitting: Podiatry

## 2022-06-14 DIAGNOSIS — Z91199 Patient's noncompliance with other medical treatment and regimen due to unspecified reason: Secondary | ICD-10-CM

## 2022-06-15 ENCOUNTER — Encounter: Payer: Self-pay | Admitting: Endocrinology

## 2022-06-15 ENCOUNTER — Encounter: Payer: Medicare Other | Admitting: Endocrinology

## 2022-06-15 LAB — BK QUANT PCR (PLASMA/SERUM)
BK Quantitaion PCR: 89 IU/mL
Log10 BK Qn PCR: 1.949 log10 IU/mL

## 2022-06-15 LAB — TACROLIMUS LEVEL: Tacrolimus (FK506) - LabCorp: 6.8 ng/mL (ref 2.0–20.0)

## 2022-06-15 NOTE — Progress Notes (Signed)
This encounter was created in error - please disregard.

## 2022-06-17 NOTE — Progress Notes (Signed)
1. No-show for appointment    Patient s/p kidney transplant.

## 2022-06-27 ENCOUNTER — Other Ambulatory Visit (HOSPITAL_COMMUNITY)
Admission: AD | Admit: 2022-06-27 | Discharge: 2022-06-27 | Disposition: A | Discharge status: Home / Self Care | Payer: Medicare Other | Source: Ambulatory Visit | Attending: Internal Medicine | Admitting: Internal Medicine

## 2022-06-27 DIAGNOSIS — Z79899 Other long term (current) drug therapy: Secondary | ICD-10-CM | POA: Diagnosis not present

## 2022-06-27 DIAGNOSIS — Z940 Kidney transplant status: Secondary | ICD-10-CM | POA: Insufficient documentation

## 2022-06-27 LAB — COMPREHENSIVE METABOLIC PANEL
ALT: 47 U/L — ABNORMAL HIGH (ref 0–44)
AST: 34 U/L (ref 15–41)
Albumin: 4 g/dL (ref 3.5–5.0)
Alkaline Phosphatase: 69 U/L (ref 38–126)
Anion gap: 9 (ref 5–15)
BUN: 38 mg/dL — ABNORMAL HIGH (ref 8–23)
CO2: 22 mmol/L (ref 22–32)
Calcium: 9.7 mg/dL (ref 8.9–10.3)
Chloride: 100 mmol/L (ref 98–111)
Creatinine, Ser: 1.39 mg/dL — ABNORMAL HIGH (ref 0.44–1.00)
GFR, Estimated: 40 mL/min — ABNORMAL LOW (ref >60)
Glucose, Bld: 192 mg/dL — ABNORMAL HIGH (ref 70–99)
Potassium: 4.3 mmol/L (ref 3.5–5.1)
Sodium: 131 mmol/L — ABNORMAL LOW (ref 135–145)
Total Bilirubin: 0.6 mg/dL (ref 0.3–1.2)
Total Protein: 7.2 g/dL (ref 6.5–8.1)

## 2022-06-27 LAB — CBC WITH DIFFERENTIAL/PLATELET
Abs Immature Granulocytes: 0.07 10*3/uL (ref 0.00–0.07)
Basophils Absolute: 0.1 10*3/uL (ref 0.0–0.1)
Basophils Relative: 1 %
Eosinophils Absolute: 0.1 10*3/uL (ref 0.0–0.5)
Eosinophils Relative: 1 %
HCT: 44.1 % (ref 36.0–46.0)
Hemoglobin: 14.8 g/dL (ref 12.0–15.0)
Immature Granulocytes: 1 %
Lymphocytes Relative: 17 %
Lymphs Abs: 0.9 10*3/uL (ref 0.7–4.0)
MCH: 33.3 pg (ref 26.0–34.0)
MCHC: 33.6 g/dL (ref 30.0–36.0)
MCV: 99.1 fL (ref 80.0–100.0)
Monocytes Absolute: 0.4 10*3/uL (ref 0.1–1.0)
Monocytes Relative: 9 %
Neutro Abs: 3.6 10*3/uL (ref 1.7–7.7)
Neutrophils Relative %: 71 %
Platelets: 201 10*3/uL (ref 150–400)
RBC: 4.45 MIL/uL (ref 3.87–5.11)
RDW: 12.9 % (ref 11.5–15.5)
WBC: 5.1 10*3/uL (ref 4.0–10.5)
nRBC: 0 % (ref 0.0–0.2)

## 2022-06-27 LAB — MAGNESIUM: Magnesium: 1.9 mg/dL (ref 1.7–2.4)

## 2022-06-27 LAB — PHOSPHORUS: Phosphorus: 3.4 mg/dL (ref 2.5–4.6)

## 2022-06-29 LAB — BK QUANT PCR (PLASMA/SERUM)
BK Quantitaion PCR: 24 IU/mL
Log10 BK Qn PCR: 1.38 log10 IU/mL

## 2022-06-29 LAB — TACROLIMUS LEVEL: Tacrolimus (FK506) - LabCorp: 7.4 ng/mL (ref 2.0–20.0)

## 2022-07-11 ENCOUNTER — Other Ambulatory Visit (HOSPITAL_COMMUNITY)
Admission: AD | Admit: 2022-07-11 | Discharge: 2022-07-11 | Disposition: A | Discharge status: Home / Self Care | Payer: Medicare Other | Source: Ambulatory Visit | Attending: Internal Medicine | Admitting: Internal Medicine

## 2022-07-11 DIAGNOSIS — Z79899 Other long term (current) drug therapy: Secondary | ICD-10-CM | POA: Diagnosis not present

## 2022-07-11 DIAGNOSIS — Z940 Kidney transplant status: Secondary | ICD-10-CM | POA: Diagnosis present

## 2022-07-11 LAB — COMPREHENSIVE METABOLIC PANEL
ALT: 57 U/L — ABNORMAL HIGH (ref 0–44)
AST: 37 U/L (ref 15–41)
Albumin: 3.8 g/dL (ref 3.5–5.0)
Alkaline Phosphatase: 79 U/L (ref 38–126)
Anion gap: 8 (ref 5–15)
BUN: 24 mg/dL — ABNORMAL HIGH (ref 8–23)
CO2: 23 mmol/L (ref 22–32)
Calcium: 9.4 mg/dL (ref 8.9–10.3)
Chloride: 103 mmol/L (ref 98–111)
Creatinine, Ser: 1.12 mg/dL — ABNORMAL HIGH (ref 0.44–1.00)
GFR, Estimated: 52 mL/min — ABNORMAL LOW (ref >60)
Glucose, Bld: 171 mg/dL — ABNORMAL HIGH (ref 70–99)
Potassium: 4.1 mmol/L (ref 3.5–5.1)
Sodium: 134 mmol/L — ABNORMAL LOW (ref 135–145)
Total Bilirubin: 0.8 mg/dL (ref 0.3–1.2)
Total Protein: 6.9 g/dL (ref 6.5–8.1)

## 2022-07-11 LAB — CBC WITH DIFFERENTIAL/PLATELET
Abs Immature Granulocytes: 0.04 10*3/uL (ref 0.00–0.07)
Basophils Absolute: 0.1 10*3/uL (ref 0.0–0.1)
Basophils Relative: 1 %
Eosinophils Absolute: 0.1 10*3/uL (ref 0.0–0.5)
Eosinophils Relative: 2 %
HCT: 44.9 % (ref 36.0–46.0)
Hemoglobin: 14.8 g/dL (ref 12.0–15.0)
Immature Granulocytes: 1 %
Lymphocytes Relative: 14 %
Lymphs Abs: 0.8 10*3/uL (ref 0.7–4.0)
MCH: 33.3 pg (ref 26.0–34.0)
MCHC: 33 g/dL (ref 30.0–36.0)
MCV: 101.1 fL — ABNORMAL HIGH (ref 80.0–100.0)
Monocytes Absolute: 0.4 10*3/uL (ref 0.1–1.0)
Monocytes Relative: 8 %
Neutro Abs: 4.4 10*3/uL (ref 1.7–7.7)
Neutrophils Relative %: 74 %
Platelets: 187 10*3/uL (ref 150–400)
RBC: 4.44 MIL/uL (ref 3.87–5.11)
RDW: 13.3 % (ref 11.5–15.5)
WBC: 5.8 10*3/uL (ref 4.0–10.5)
nRBC: 0 % (ref 0.0–0.2)

## 2022-07-11 LAB — MAGNESIUM: Magnesium: 1.7 mg/dL (ref 1.7–2.4)

## 2022-07-11 LAB — PHOSPHORUS: Phosphorus: 3.5 mg/dL (ref 2.5–4.6)

## 2022-07-12 LAB — CMV DNA, QUANTITATIVE, PCR
CMV DNA Quant: NEGATIVE IU/mL
Log10 CMV Qn DNA Pl: UNDETERMINED log10 IU/mL

## 2022-07-12 LAB — BK QUANT PCR (PLASMA/SERUM)
BK Quantitaion PCR: 29 IU/mL
Log10 BK Qn PCR: 1.462 log10 IU/mL

## 2022-07-13 LAB — TACROLIMUS LEVEL: Tacrolimus (FK506) - LabCorp: 6.1 ng/mL (ref 2.0–20.0)

## 2022-07-14 ENCOUNTER — Telehealth: Payer: Self-pay | Admitting: Dietician

## 2022-07-14 NOTE — Telephone Encounter (Signed)
Patient called to make sure that her Dexcom is connected to our clinic.  It is verified that it is connected.  Confirmed that patient is aware of her appointment with Dr. Lucianne Muss tomorrow.

## 2022-07-15 ENCOUNTER — Ambulatory Visit (INDEPENDENT_AMBULATORY_CARE_PROVIDER_SITE_OTHER): Payer: Medicare Other | Admitting: Endocrinology

## 2022-07-15 ENCOUNTER — Encounter: Payer: Self-pay | Admitting: Endocrinology

## 2022-07-15 VITALS — BP 134/72 | HR 60 | Ht 62.0 in | Wt 115.0 lb

## 2022-07-15 DIAGNOSIS — E1165 Type 2 diabetes mellitus with hyperglycemia: Secondary | ICD-10-CM

## 2022-07-15 DIAGNOSIS — Z794 Long term (current) use of insulin: Secondary | ICD-10-CM | POA: Diagnosis not present

## 2022-07-15 DIAGNOSIS — I10 Essential (primary) hypertension: Secondary | ICD-10-CM

## 2022-07-15 NOTE — Progress Notes (Signed)
Patient ID: [name], female   DOB: 09/01/1950, 72 y.o.   MRN: [id number]           Reason for Appointment: Type II Diabetes follow-up   History of Present Illness   Diagnosis date: 2007  Previous history:  Not been able to continue metformin because of renal failure since 2020 She is instead been taking repaglinide 2 mg since 2021 and acarbose since 2022 In the past her A1c has ranged between 6.08.3 with the highest in 2021  Recent history:     Non-insulin hypoglycemic drugs: None         Insulin: Tresiba 8 units in the morning, Humalog 3 units with meals    Side effects from medications: Jardiance 10 mg daily  Current self management, blood sugar patterns and problems identified:  A1c is last 7.4, no recent labs available  Her Dexcom is now available for download and able to review it She was switched from NPH to [place] once a day in the morning on the last visit She did not change her dose since her fasting blood sugars were looking fairly good with the same dose Previously was having relatively high fasting readings over 200 Also taking prednisone 5 mg in the morning as before She is now taking 3 units of the Humalog before each meal as recommended on the last visit but does not adjusted based on what she is eating or how her blood sugars are after meals Blood sugars are somewhat higher after breakfast generally but variable after dinner At lunchtime she usually has less carbohydrate and blood sugars are not quite as high   CONTINUOUS GLUCOSE MONITORING RECORD INTERPRETATION    Dates of Recording: Last 2 weeks  Sensor description: Dexcom G7  Results statistics:   CGM use % of time   Average and SD 155/27  Time in range     71   %  % Time Above 180 27  % Time above 250 2  % Time Below target     PRE-MEAL Fasting Lunch Dinner Bedtime Overall  Glucose range:       Mean/median: 124   171    POST-MEAL PC Breakfast PC Lunch PC Dinner  Glucose range:      Mean/median: 175      Glycemic patterns summary: Blood sugars are on average are within the target range at all times with highest readings late at night and after breakfast and lowest early morning   Hypoglycemic episodes did not occur  Overnight periods: Blood sugars are averaging near 180 at midnight and then progressively decreasing until 6 AM without hypoglycemia  Preprandial periods: Blood sugars are overall increased with some variability at dinnertime, mostly around 150+  Postprandial periods:   After breakfast:   Blood sugars are rising to a variable extent but about half the time over 180 After lunch:   Blood sugars are generally level or lower compared to Premeal readings After dinner: Glucose readings in the first week for frequently higher above 180 but less consistently in the second week   Previous readings:  PRE-MEAL Fasting Lunch Dinner Bedtime Overall  Glucose range: 229  80    Mean/median:        POST-MEAL PC Breakfast PC Lunch PC Dinner  Glucose range:  264, 291   Mean/median:        Diet management: She eats 2-3 meals a day with generally eggs, toast and meat in the morning and a protein and vegetables and  a starch at lunch and dinner               Dietician consultations with dialysis nutritionist only     Weight control:  Wt Readings from Last 3 Encounters:  07/15/22 115 lb (52.2 kg)  06/15/22 115 lb (52.2 kg)  05/11/22 112 lb 4.8 oz (50.9 kg)            Diabetes labs:  Lab Results  Component Value Date   HGBA1C 7.4 (A) 05/02/2022   HGBA1C 4.9 01/28/2022   HGBA1C 5.2 11/23/2021   Lab Results  Component Value Date   MICROALBUR 12.8 (H) 08/28/2015   LDLCALC 80 07/11/2018   CREATININE 1.12 (H) 07/11/2022   Lab Results  Component Value Date   FRUCTOSAMINE 363 (H) 04/27/2022   FRUCTOSAMINE 315 (H) 01/28/2022   FRUCTOSAMINE 294 (H) 09/29/2021     Allergies as of 07/15/2022       Reactions   Tape Other (See Comments)   Paper tape  rips skin.    Semaglutide Rash   Rybelsus 3 mg        Medication List        Accurate as of July 15, 2022  8:45 AM. If you have any questions, ask your nurse or doctor.          acetaminophen 500 MG tablet Commonly known as: TYLENOL Take 1,000 mg by mouth every 6 (six) hours as needed for moderate pain.   acetaminophen 325 MG tablet Commonly known as: TYLENOL Take 2 tablets by mouth every 6 (six) hours as needed.   amLODipine 10 MG tablet Commonly known as: NORVASC TAKE 1 TABLET BY MOUTH EVERY DAY   atorvastatin 80 MG tablet Commonly known as: LIPITOR TAKE 1 TABLET BY MOUTH EVERY DAY   atorvastatin 40 MG tablet Commonly known as: LIPITOR Take 1 tablet by mouth at bedtime.   blood glucose meter kit and supplies Kit Dispense based on patient and insurance preference. Use up to four times daily as directed.   Calcium Acetate 667 MG Tabs Take 1 tablet by mouth daily.   Dexcom G7 Receiver Devi Used to monitor blood sugar. Replace once per year.   Dexcom G7 Sensor Misc Use to monitor blood sugar. Replace sensor every 10 days.   Dialyvite 800-Zinc 15 0.8 MG Tabs Take 1 tablet by mouth daily.   epoetin alfa-epbx 40000 UNIT/ML injection Commonly known as: RETACRIT Inject 40,000 Units into the skin every 28 (twenty-eight) days.   hydrALAZINE 50 MG tablet Commonly known as: APRESOLINE Take 1 tablet (50 mg total) by mouth every 8 (eight) hours. What changed: when to take this   insulin lispro 100 UNIT/ML KwikPen Commonly known as: HUMALOG 3 to 4 units before each meal   isosorbide mononitrate 30 MG 24 hr tablet Commonly known as: IMDUR Take 1 tablet (30 mg total) by mouth daily.   Jardiance 10 MG Tabs tablet Generic drug: empagliflozin Take 10 mg by mouth daily.   lidocaine 5 % Commonly known as: LIDODERM Place 1 patch onto the skin every 12 (twelve) hours.   lidocaine-prilocaine cream Commonly known as: EMLA Apply 1 Application topically See admin  instructions. Three times a week   magnesium oxide 400 MG tablet Commonly known as: MAG-OX Take 400 mg by mouth daily.   metoprolol succinate 100 MG 24 hr tablet Commonly known as: TOPROL-XL Take 100 mg by mouth daily.   mycophenolate 250 MG capsule Commonly known as: CELLCEPT Take 250 mg by mouth 2 (  two) times daily. Take 4 capsules twice daily   NIFEdipine 60 MG 24 hr tablet Commonly known as: PROCARDIA XL/NIFEDICAL XL Take 1 tablet by mouth 2 (two) times daily.   OneTouch Delica Lancets 33G Misc 1 each by Does not apply route 2 (two) times daily. E11.9   OneTouch Verio Flex System w/Device Kit 1 each by Does not apply route 2 (two) times daily. E11.9   OneTouch Verio test strip Generic drug: glucose blood USE AS DIRECTED TWICE DAILY   oxyCODONE 5 MG immediate release tablet Commonly known as: Oxy IR/ROXICODONE Take 1 tablet by mouth every 6 (six) hours as needed.   oxyCODONE-acetaminophen 5-325 MG tablet Commonly known as: Percocet Take 1 tablet by mouth every 6 (six) hours as needed for severe pain.   pantoprazole 40 MG tablet Commonly known as: PROTONIX Take 1 tablet (40 mg total) by mouth daily.   polyethylene glycol 17 g packet Commonly known as: MIRALAX / GLYCOLAX Take 34-51 g by mouth daily as needed for mild constipation.   predniSONE 5 MG tablet Commonly known as: DELTASONE Take 3 tablets by mouth daily.   senna 8.6 MG tablet Commonly known as: SENOKOT Take 2 tablets by mouth 2 (two) times daily as needed.   sulfamethoxazole-trimethoprim 400-80 MG tablet Commonly known as: BACTRIM Take 1 tablet by mouth daily.   tacrolimus 1 MG capsule Commonly known as: PROGRAF Take 6 capsules by mouth.   Evaristo Bury FlexTouch 100 UNIT/ML FlexTouch Pen Generic drug: insulin degludec Inject 10 Units into the skin daily. What changed: how much to take   valGANciclovir 450 MG tablet Commonly known as: VALCYTE Take 1 tablet by mouth every 12 (twelve) hours.    Vitamin D (Ergocalciferol) 1.25 MG (50000 UNIT) Caps capsule Commonly known as: DRISDOL Take 50,000 Units by mouth once a week. For 8 days        Allergies:  Allergies  Allergen Reactions   Tape Other (See Comments)    Paper tape rips skin.    Semaglutide Rash    Rybelsus 3 mg    Past Medical History:  Diagnosis Date   Arthritis    Chronic back pain    Chronic kidney disease (CKD)    Constipation    Cough    Diabetes mellitus, type 2 (HCC)    Diverticulosis    Fibroid    patient thinks this was the reason for her hysterectomy   GERD (gastroesophageal reflux disease)    Heart murmur    History of sebaceous cyst    Hyperlipemia    Hyperplastic colon polyp    Hypertension    Hyperthyroidism    Lumbar radiculopathy    Shortness of breath 09/13/2013   Stroke (HCC) 2015?   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    Past Surgical History:  Procedure Laterality Date   ABDOMINAL HYSTERECTOMY     --?ovaries remain   AV FISTULA PLACEMENT Left 07/16/2020   Procedure: LEFT BRACHIOCEPHALIC ARTERIOVENOUS (AV) FISTULA CREATION;  Surgeon: Leonie Douglas, MD;  Location: [facility] OR;  Service: Vascular;  Laterality: Left;  PERIPHERAL NERVE BLOCK   COLONOSCOPY W/ BIOPSIES  09/11/2008   ESOPHAGOGASTRODUODENOSCOPY (EGD) WITH PROPOFOL N/A 05/20/2020   Procedure: ESOPHAGOGASTRODUODENOSCOPY (EGD) WITH PROPOFOL;  Surgeon: Hilarie Fredrickson, MD;  [facility];  Service: Endoscopy;  Laterality: N/A;   GIVENS CAPSULE STUDY N/A 06/28/2020   Procedure: GIVENS CAPSULE STUDY;  Surgeon: Yancey Flemings  N, MD;  Location: [facility] ENDOSCOPY;  Service: Endoscopy;  Laterality: N/A;   RETINOPATHY SURGERY Bilateral 2013   Dr. Ashley Royalty    Family History  Problem Relation Age of Onset   Hypertension Mother    Sleep apnea Mother    Diabetes Mother    Hyperlipidemia Mother    Lung cancer Father        smoker   Hypertension Sister    Diabetes Sister     Hypertension Brother    Hypertension Sister    Diabetes Brother    Hypertension Brother    Breast cancer Neg Hx    Colon cancer Neg Hx    Esophageal cancer Neg Hx    Pancreatic cancer Neg Hx    Liver disease Neg Hx    Colon polyps Neg Hx     Social History:  reports that she has been smoking cigarettes. She has a 17.50 pack-year smoking history. She has never used smokeless tobacco. She reports that she does not drink alcohol and does not use drugs.  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 with only minimal increase in creatinine recently  Lipids:  followed by PCP on atorvastatin 80 mg No history of coronary artery disease, may have PVD  LDL is last 69 from her PCP    Lab Results  Component Value Date   CHOL 155 07/11/2018   CHOL 157 07/10/2017   CHOL 141 01/11/2016   Lab Results  Component Value Date   HDL 44.70 07/11/2018   HDL 39.40 07/10/2017   HDL 38.40 (L) 01/11/2016   Lab Results  Component Value Date   LDLCALC 80 07/11/2018   LDLCALC 84 01/11/2016   LDLCALC 75 01/15/2015   Lab Results  Component Value Date   TRIG 151.0 (H) 07/11/2018   TRIG 270.0 (H) 07/10/2017   TRIG 95.0 01/11/2016   Lab Results  Component Value Date   CHOLHDL 3 07/11/2018   CHOLHDL 4 07/10/2017   CHOLHDL 4 01/11/2016   Lab Results  Component Value Date   LDLDIRECT 69.0 09/29/2021   LDLDIRECT 79.0 07/10/2017   LDLDIRECT 130.4 12/13/2012    No history of thyroid dysfunction but in 2018 had a 1.8 cm right thyroid nodule which was benign on biopsy  Lab Results  Component Value Date   TSH 1.83 09/24/2019   TSH 1.854 09/09/2017   TSH 1.56 09/08/2017     Examination:   BP 134/72 (BP Location: Left Arm, Patient  Position: Sitting, Cuff Size: Small)   Pulse 60   Ht 5\' 2"  (1.575 m)   Wt 115 lb (52.2 kg)   SpO2 99%   BMI 21.03 kg/m   Body mass index is 21.03 kg/m.    ASSESSMENT/ PLAN:    Diabetes type 2 non-insulin-dependent:   Current regimen: TRESIBA 8 units insulin in the morning and Humalog 3 units before meals  Her A1c was last 7.4  Her insulin regimen and day-to-day regimen was discussed  Fasting readings are much better with [place] Blood sugars are now evaluated with a Dexcom download which was linked finally  TRESIBA: She will continue 8 units unless morning sugars are out of line  Today discussed in detail the adjustment of mealtime insulin to cover postprandial  spikes, time action of mealtime insulin, dosage titration based on meal size and carbohydrate intake to target the two-hour reading of under 180 at least Likely need 4 units to cover her breakfast which is more carbohydrate and 2-3 only for lunch She can also skip the insulin at dinnertime if she does not want to eat Likely needs 4 units for full meals at dinnertime, written instructions given  There are no Patient Instructions on file for this visit.  Total visit time including counseling = 30 minutes  Reather Littler 07/15/2022, 8:45 AM

## 2022-07-15 NOTE — Patient Instructions (Signed)
Humalog 4 U at breakfast and 2-3 at lunch, supper 4 units for full meal

## 2022-07-17 IMAGING — CT CT ABD-PELV W/O CM
2 of 4 series · 16 of 46 positions shown, 18 images · non-contrast
Comparison: November 29, 2019.

CLINICAL DATA: Melena, acute generalized abdominal pain.

EXAM:
CT ABDOMEN AND PELVIS WITHOUT CONTRAST
TECHNIQUE: Multidetector CT imaging of the abdomen and pelvis was performed
following the standard protocol without IV contrast.

[Series 2: axial st · axial · 0.77mm/px · z∈[+1010,+1400]mm · 13 of 88 slices shown, 15 images]
[im 5/88  soft-tissue]
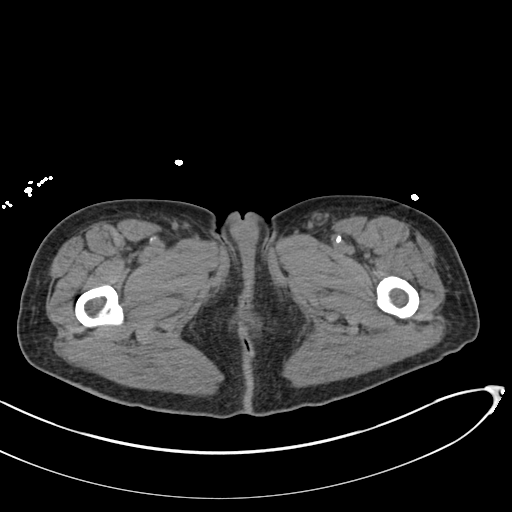
[im 5/88  bone]
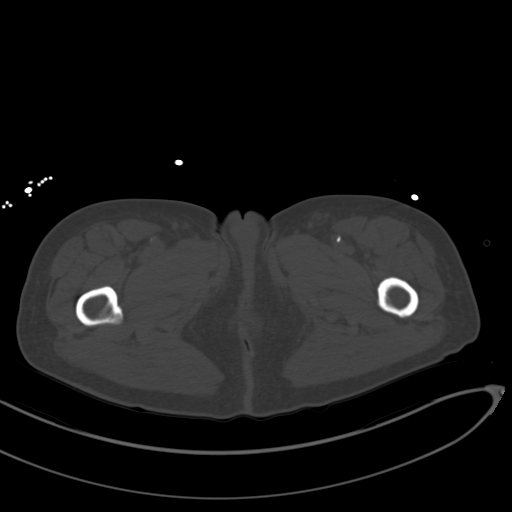
[im 14/88  soft-tissue]
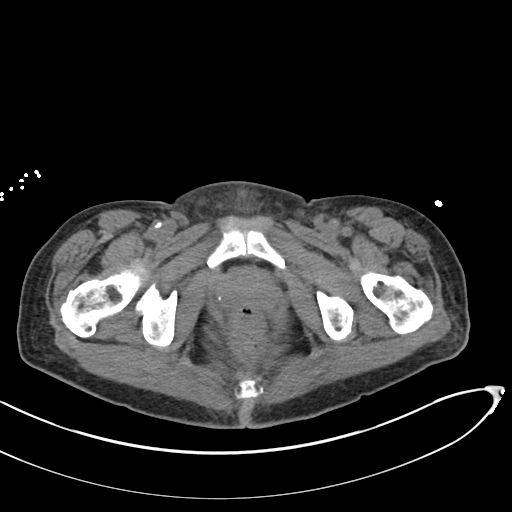
[im 19/88  soft-tissue]
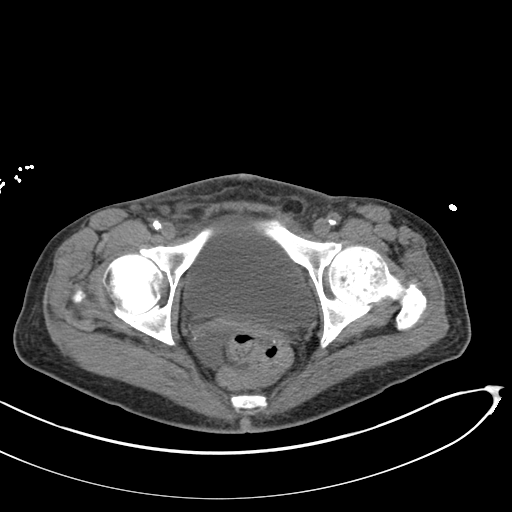
[im 23/88  soft-tissue]
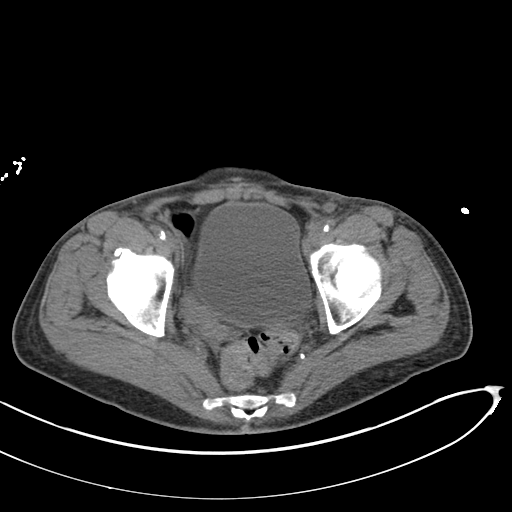
[im 33/88  soft-tissue]
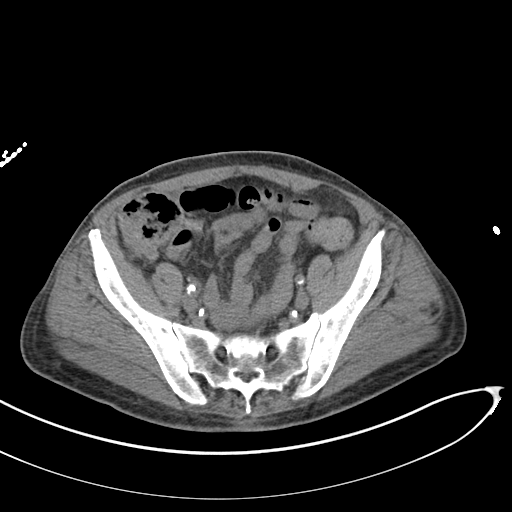
[im 37/88  soft-tissue]
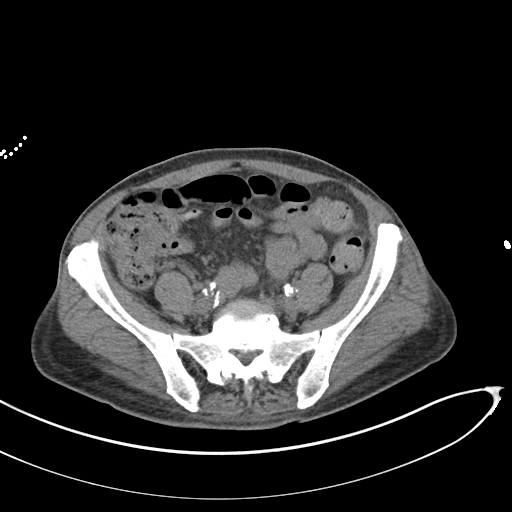
[im 46/88  soft-tissue]
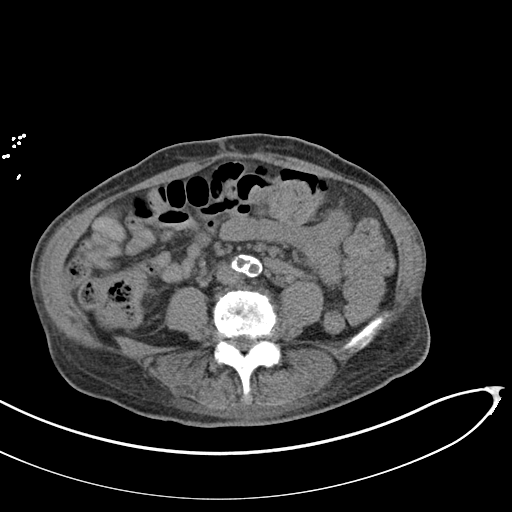
[im 51/88  soft-tissue]
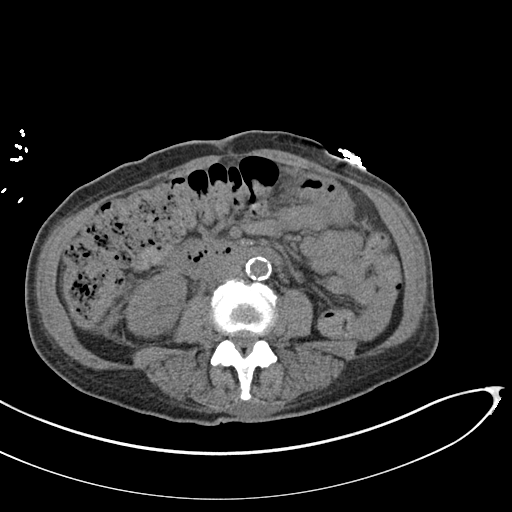
[im 55/88  soft-tissue]
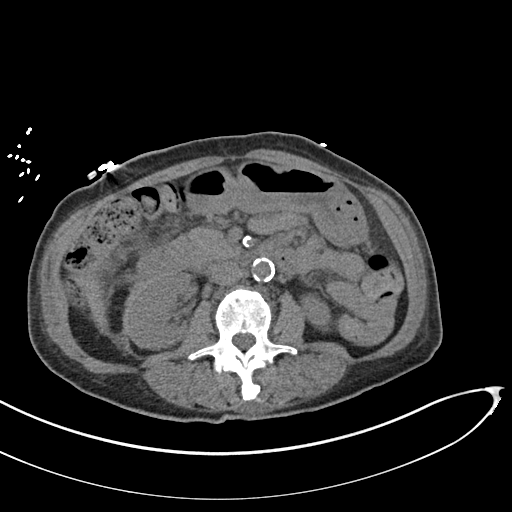
[im 55/88  bone]
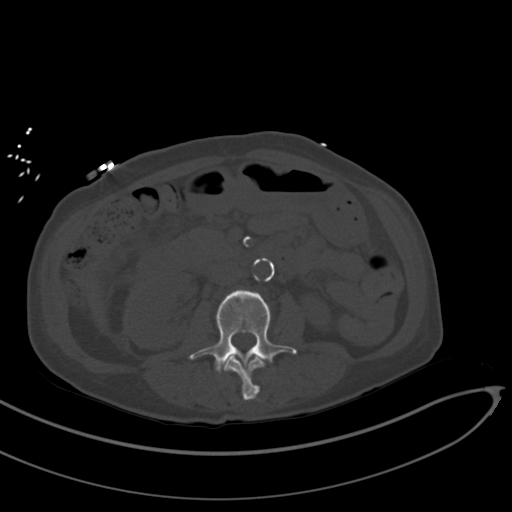
[im 65/88  soft-tissue]
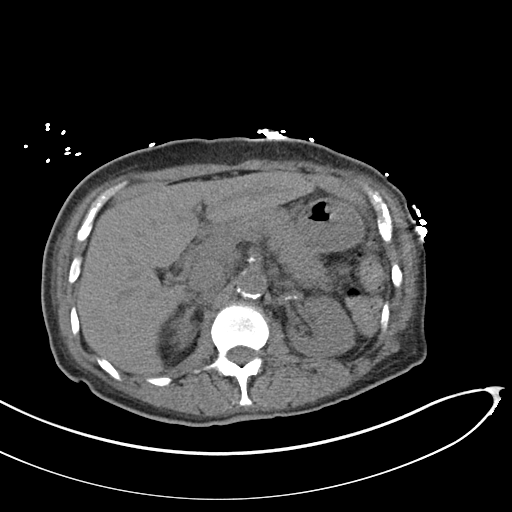
[im 69/88  soft-tissue]
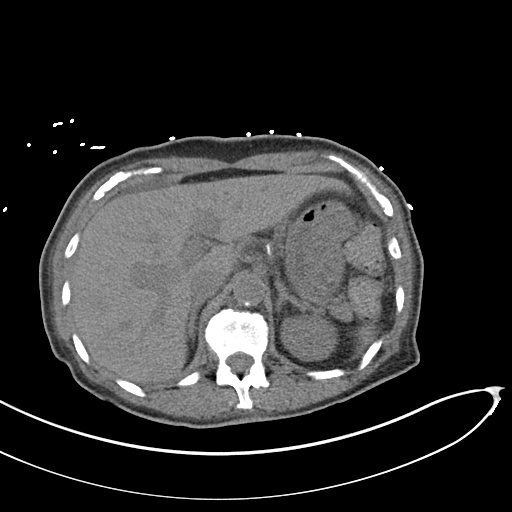
[im 74/88  soft-tissue]
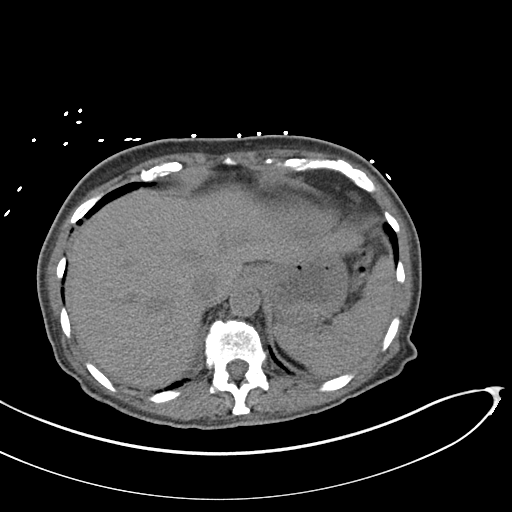
[im 83/88  soft-tissue]
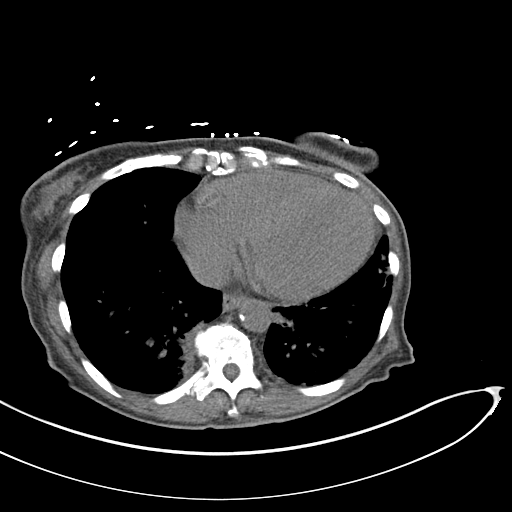

[Series 5: coronal st · coronal · 0.74mm/px · 3 of 120 slices shown]
[im 40/120  soft-tissue]
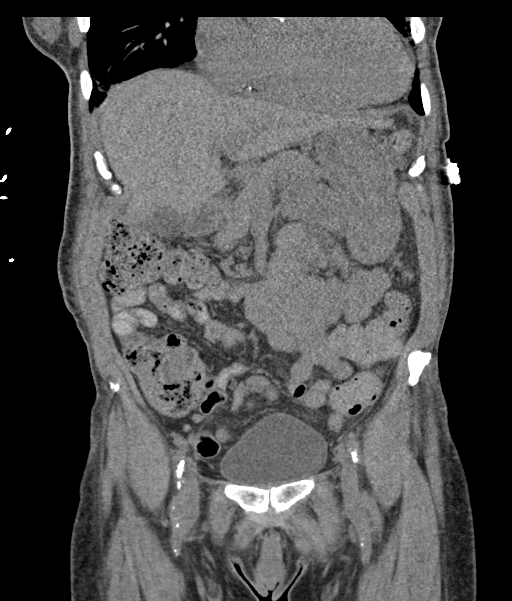
[im 53/120  soft-tissue]
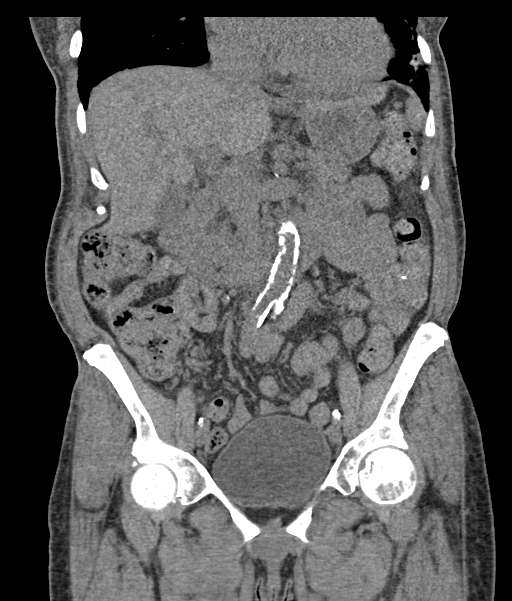
[im 67/120  soft-tissue]
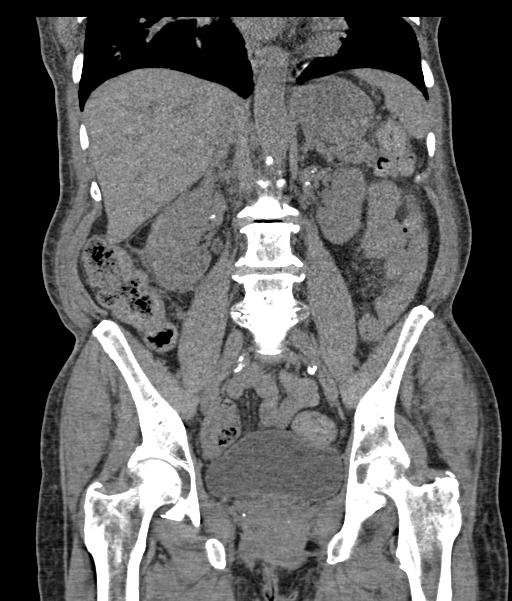

[16 of 46 positions shown; findings below may reference images not displayed]

FINDINGS: Lower chest: Minimal bibasilar subsegmental atelectasis is noted.

Hepatobiliary: No focal liver abnormality is seen. No gallstones,
gallbladder wall thickening, or biliary dilatation.

Pancreas: Unremarkable. No pancreatic ductal dilatation or
surrounding inflammatory changes.

Spleen: Normal in size without focal abnormality.

Adrenals/Urinary Tract: Adrenal glands appear normal. Right renal
subcapsular hematoma is significantly smaller compared to prior
exam. No hydronephrosis or renal obstruction is noted. No renal or
ureteral calculi are noted. Urinary bladder is unremarkable.

Stomach/Bowel: Stomach is within normal limits. Appendix appears
normal. No evidence of bowel wall thickening, distention, or
inflammatory changes. Stool is noted throughout the colon.

Vascular/Lymphatic: Aortic atherosclerosis. No enlarged abdominal or
pelvic lymph nodes.

Reproductive: Status post hysterectomy. No adnexal masses.

Other: No abdominal wall hernia or abnormality. No abdominopelvic
ascites.

Musculoskeletal: No acute or significant osseous findings.
IMPRESSION: Right renal subcapsular hematoma noted on prior exam is
significantly smaller currently.

No other significant abnormality is noted in the abdomen or pelvis.

Aortic Atherosclerosis ([id number]).

## 2022-07-21 ENCOUNTER — Encounter: Payer: Self-pay | Admitting: Endocrinology

## 2022-07-25 ENCOUNTER — Other Ambulatory Visit (HOSPITAL_COMMUNITY)
Admission: RE | Admit: 2022-07-25 | Discharge: 2022-07-25 | Disposition: A | Discharge status: Home / Self Care | Payer: Medicare Other | Source: Ambulatory Visit | Attending: Endocrinology | Admitting: Endocrinology

## 2022-07-25 DIAGNOSIS — Z79899 Other long term (current) drug therapy: Secondary | ICD-10-CM | POA: Diagnosis present

## 2022-07-25 DIAGNOSIS — Z940 Kidney transplant status: Secondary | ICD-10-CM | POA: Diagnosis present

## 2022-07-25 LAB — CBC WITH DIFFERENTIAL/PLATELET
Abs Immature Granulocytes: 0.06 10*3/uL (ref 0.00–0.07)
Basophils Absolute: 0.1 10*3/uL (ref 0.0–0.1)
Basophils Relative: 1 %
Eosinophils Absolute: 0.1 10*3/uL (ref 0.0–0.5)
Eosinophils Relative: 1 %
HCT: 42.2 % (ref 36.0–46.0)
Hemoglobin: 13.9 g/dL (ref 12.0–15.0)
Immature Granulocytes: 1 %
Lymphocytes Relative: 12 %
Lymphs Abs: 0.8 10*3/uL (ref 0.7–4.0)
MCH: 33.8 pg (ref 26.0–34.0)
MCHC: 32.9 g/dL (ref 30.0–36.0)
MCV: 102.7 fL — ABNORMAL HIGH (ref 80.0–100.0)
Monocytes Absolute: 0.9 10*3/uL (ref 0.1–1.0)
Monocytes Relative: 13 %
Neutro Abs: 5.1 10*3/uL (ref 1.7–7.7)
Neutrophils Relative %: 72 %
Platelets: 199 10*3/uL (ref 150–400)
RBC: 4.11 MIL/uL (ref 3.87–5.11)
RDW: 13.3 % (ref 11.5–15.5)
WBC: 7.1 10*3/uL (ref 4.0–10.5)
nRBC: 0 % (ref 0.0–0.2)

## 2022-07-25 LAB — COMPREHENSIVE METABOLIC PANEL
ALT: 46 U/L — ABNORMAL HIGH (ref 0–44)
AST: 37 U/L (ref 15–41)
Albumin: 3.8 g/dL (ref 3.5–5.0)
Alkaline Phosphatase: 68 U/L (ref 38–126)
Anion gap: 9 (ref 5–15)
BUN: 22 mg/dL (ref 8–23)
CO2: 22 mmol/L (ref 22–32)
Calcium: 9.5 mg/dL (ref 8.9–10.3)
Chloride: 105 mmol/L (ref 98–111)
Creatinine, Ser: 1.08 mg/dL — ABNORMAL HIGH (ref 0.44–1.00)
GFR, Estimated: 55 mL/min — ABNORMAL LOW (ref >60)
Glucose, Bld: 152 mg/dL — ABNORMAL HIGH (ref 70–99)
Potassium: 3.8 mmol/L (ref 3.5–5.1)
Sodium: 136 mmol/L (ref 135–145)
Total Bilirubin: 0.8 mg/dL (ref 0.3–1.2)
Total Protein: 6.7 g/dL (ref 6.5–8.1)

## 2022-07-25 LAB — MAGNESIUM: Magnesium: 1.8 mg/dL (ref 1.7–2.4)

## 2022-07-25 LAB — PHOSPHORUS: Phosphorus: 3.4 mg/dL (ref 2.5–4.6)

## 2022-07-26 LAB — TACROLIMUS LEVEL: Tacrolimus (FK506) - LabCorp: 2.1 ng/mL (ref 2.0–20.0)

## 2022-07-26 LAB — BK QUANT PCR (PLASMA/SERUM)
BK Quantitaion PCR: 105 IU/mL
Log10 BK Qn PCR: 2.021 log10 IU/mL

## 2022-08-08 ENCOUNTER — Other Ambulatory Visit (HOSPITAL_COMMUNITY)
Admission: AD | Admit: 2022-08-08 | Discharge: 2022-08-08 | Disposition: A | Discharge status: Home / Self Care | Payer: Medicare Other | Source: Ambulatory Visit

## 2022-08-08 DIAGNOSIS — Z029 Encounter for administrative examinations, unspecified: Secondary | ICD-10-CM | POA: Diagnosis present

## 2022-08-08 DIAGNOSIS — Z940 Kidney transplant status: Secondary | ICD-10-CM | POA: Insufficient documentation

## 2022-08-08 LAB — COMPREHENSIVE METABOLIC PANEL
ALT: 45 U/L — ABNORMAL HIGH (ref 0–44)
AST: 36 U/L (ref 15–41)
Albumin: 4 g/dL (ref 3.5–5.0)
Alkaline Phosphatase: 85 U/L (ref 38–126)
Anion gap: 8 (ref 5–15)
BUN: 21 mg/dL (ref 8–23)
CO2: 21 mmol/L — ABNORMAL LOW (ref 22–32)
Calcium: 9.6 mg/dL (ref 8.9–10.3)
Chloride: 105 mmol/L (ref 98–111)
Creatinine, Ser: 1.08 mg/dL — ABNORMAL HIGH (ref 0.44–1.00)
GFR, Estimated: 55 mL/min — ABNORMAL LOW (ref >60)
Glucose, Bld: 208 mg/dL — ABNORMAL HIGH (ref 70–99)
Potassium: 3.8 mmol/L (ref 3.5–5.1)
Sodium: 134 mmol/L — ABNORMAL LOW (ref 135–145)
Total Bilirubin: 0.8 mg/dL (ref 0.3–1.2)
Total Protein: 7.1 g/dL (ref 6.5–8.1)

## 2022-08-08 LAB — CBC WITH DIFFERENTIAL/PLATELET
Abs Immature Granulocytes: 0.05 10*3/uL (ref 0.00–0.07)
Basophils Absolute: 0.1 10*3/uL (ref 0.0–0.1)
Basophils Relative: 1 %
Eosinophils Absolute: 0.1 10*3/uL (ref 0.0–0.5)
Eosinophils Relative: 1 %
HCT: 42.9 % (ref 36.0–46.0)
Hemoglobin: 14.4 g/dL (ref 12.0–15.0)
Immature Granulocytes: 1 %
Lymphocytes Relative: 15 %
Lymphs Abs: 0.9 10*3/uL (ref 0.7–4.0)
MCH: 34.6 pg — ABNORMAL HIGH (ref 26.0–34.0)
MCHC: 33.6 g/dL (ref 30.0–36.0)
MCV: 103.1 fL — ABNORMAL HIGH (ref 80.0–100.0)
Monocytes Absolute: 0.9 10*3/uL (ref 0.1–1.0)
Monocytes Relative: 14 %
Neutro Abs: 4.3 10*3/uL (ref 1.7–7.7)
Neutrophils Relative %: 68 %
Platelets: 208 10*3/uL (ref 150–400)
RBC: 4.16 MIL/uL (ref 3.87–5.11)
RDW: 13 % (ref 11.5–15.5)
WBC: 6.3 10*3/uL (ref 4.0–10.5)
nRBC: 0 % (ref 0.0–0.2)

## 2022-08-08 LAB — MAGNESIUM: Magnesium: 1.9 mg/dL (ref 1.7–2.4)

## 2022-08-08 LAB — PHOSPHORUS: Phosphorus: 3.1 mg/dL (ref 2.5–4.6)

## 2022-08-09 LAB — BK QUANT PCR (PLASMA/SERUM): BK Quantitaion PCR: POSITIVE IU/mL

## 2022-08-09 LAB — TACROLIMUS LEVEL: Tacrolimus (FK506) - LabCorp: 6 ng/mL (ref 2.0–20.0)

## 2022-08-15 ENCOUNTER — Ambulatory Visit: Payer: Medicare Other | Admitting: Internal Medicine

## 2022-09-08 IMAGING — DX DG CHEST 2V
2 series · 2 of 2 positions shown · non-contrast
Comparison: Chest x-ray 06/26/2020.

CLINICAL DATA: Shortness of breath.

EXAM:
CHEST - 2 VIEW

[chest lat]
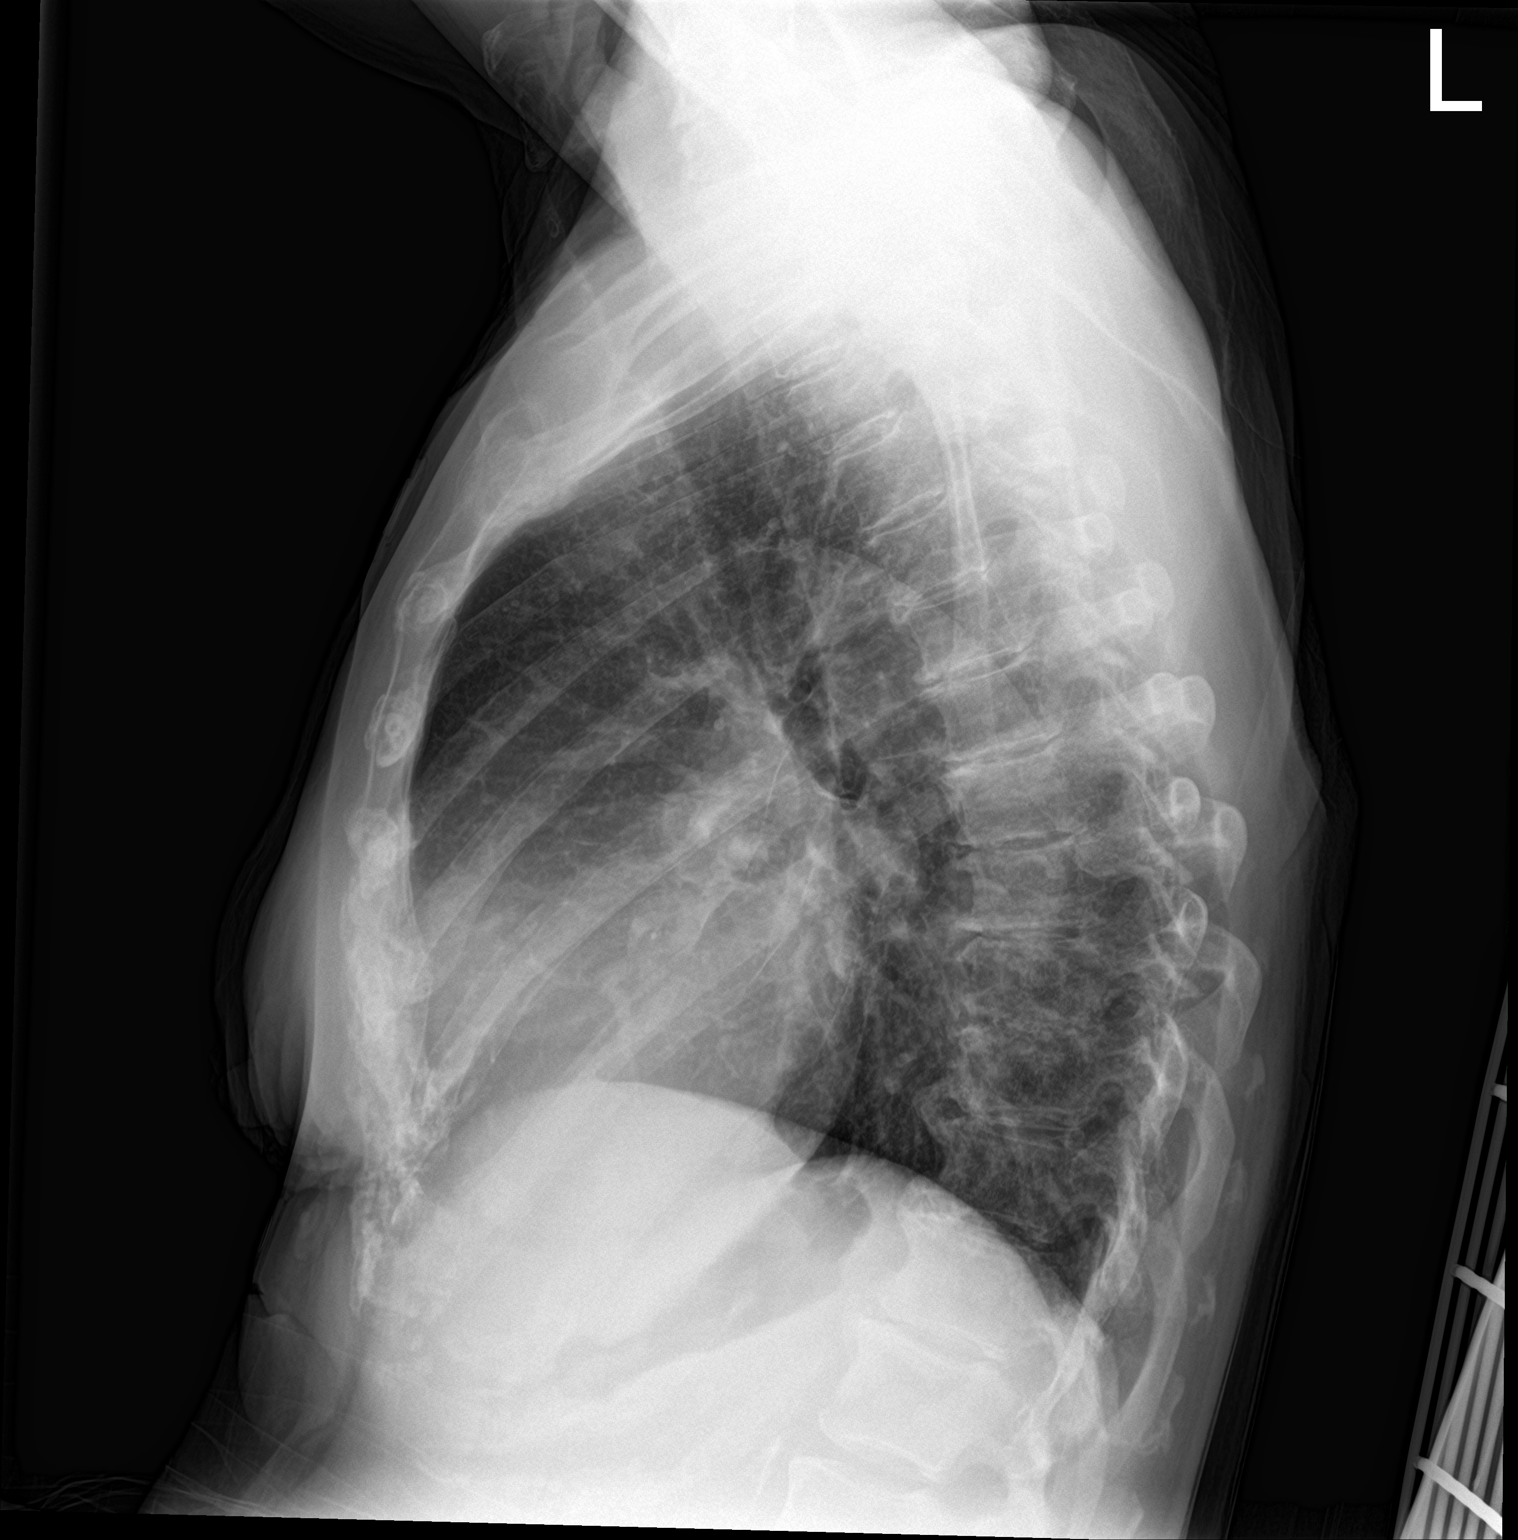

[chest ap]
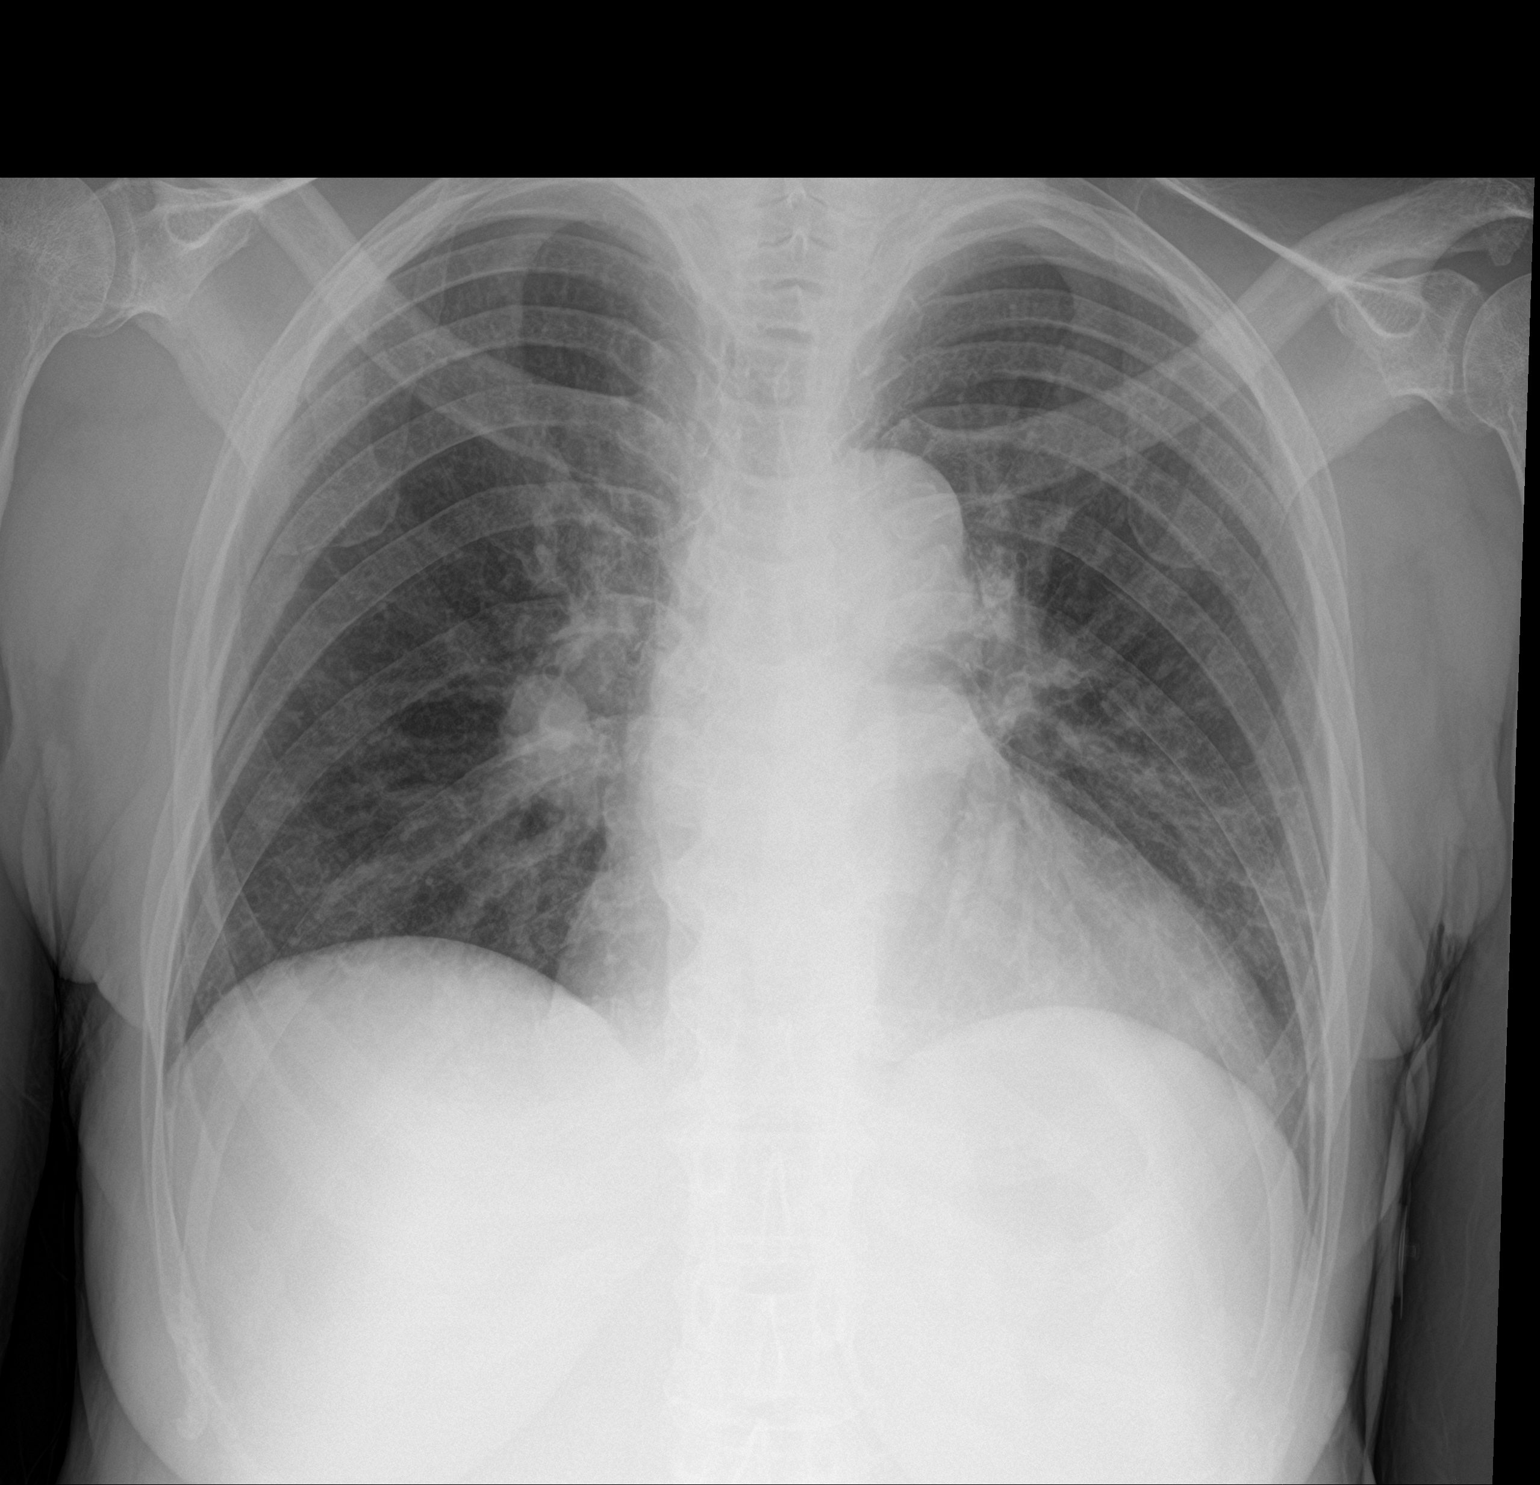

[2 of 2 positions shown; findings below may reference images not displayed]

FINDINGS: Mediastinum is normal. Cardiomegaly with pulmonary venous
prominence. Bilateral interstitial prominence noted on today's exam.
Findings suggest CHF. Pneumonitis cannot be excluded. No
pneumothorax. Degenerative change thoracic spine.
IMPRESSION: Cardiomegaly with pulmonary venous prominence. Bilateral
interstitial prominence noted on today's exam. Findings suggest CHF.
Pneumonitis cannot be excluded.

## 2022-09-09 ENCOUNTER — Ambulatory Visit (INDEPENDENT_AMBULATORY_CARE_PROVIDER_SITE_OTHER): Payer: Medicare Other | Admitting: Endocrinology

## 2022-09-09 ENCOUNTER — Encounter: Payer: Self-pay | Admitting: Endocrinology

## 2022-09-09 DIAGNOSIS — Z794 Long term (current) use of insulin: Secondary | ICD-10-CM

## 2022-09-09 DIAGNOSIS — E1165 Type 2 diabetes mellitus with hyperglycemia: Secondary | ICD-10-CM

## 2022-09-09 LAB — POCT GLYCOSYLATED HEMOGLOBIN (HGB A1C): Hemoglobin A1C: 7 % — AB (ref 4.0–5.6)

## 2022-09-09 NOTE — Progress Notes (Addendum)
Outpatient Endocrinology Note [place] Anarie Kalish, MD  09/09/22  Patient's Name: [name]    DOB: 1951-01-03    MRN: [id number]                                                    REASON OF VISIT: Follow up for type 2 diabetes mellitus  PCP: Default, Provider, MD  HISTORY OF PRESENT ILLNESS:   [name] is a 72 y.o. old female with past medical history listed below, is here for follow up of type 2 diabetes mellitus.   Pertinent Diabetes History: Patient was diagnosed with type 2 diabetes mellitus in 2007.  Patient had transplant in January 2024 in [place].  Patient used to be on oral antidiabetic medication in the past.  She is currently on basal bolus insulin regimen.  Chronic Diabetes Complications : Retinopathy: no. Last ophthalmology exam was done on annually, reportedly. 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 Peripheral neuropathy: no Coronary artery disease: no Stroke: no  Relevant comorbidities and cardiovascular risk factors: Obesity: no There is no height or weight on file to calculate BMI.  Hypertension: yes Hyperlipidemia. yes  Current / Home Diabetic regimen includes: Tresiba 88 units in the morning. Humalog 3 units with meals 3 times a day.  Prior diabetic medications: Metformin, repaglinide, Actos.  Glycemic data:    CONTINUOUS GLUCOSE MONITORING SYSTEM (CGMS) INTERPRETATION: At today's visit, we reviewed CGM downloads. The full report is scanned in the media. Reviewing the CGM trends, blood glucose are as follows:  Dexcom G7 CGM-  Sensor Download (Sensor download was reviewed and summarized below.) Dates: August 3 to September 09, 2022  Glucose Management Indicator: 6.9% Sensor Average: 149 SD 44 Sensor use is 93%  Glycemic Trends:  <54: 0% 54-70: 0% 71-180: 78% 181-250: 19% 251-400: 3%  Interpretation: -Most of the blood sugar are acceptable overnight and in between the meals.   Occasional mild hyperglycemia up to 200 range around noon time and sometime at bedtime related with eating bedtime snack and high carbohydrate rich snack.  No hypoglycemia.  Hypoglycemia: Patient has no hypoglycemic episodes. Patient has hypoglycemia awareness.   Factors modifying glucose control: 1.  Diabetic diet assessment: Eating 3 meals a day and sometimes eats Diet Coke and donut.  2.  Staying active or exercising: No formal exercise.  3.  Medication compliance: compliant all of the time.  Interval history 09/09/22 CGM data as reviewed above.  Most of the blood sugar acceptable.  Occasional hyperglycemia related with eating high carbohydrate snack for example donut ask to stop eating donuts.  No other complaints today.   REVIEW OF SYSTEMS As per history of present illness.   PAST MEDICAL HISTORY: Past Medical History:  Diagnosis Date   Arthritis    Chronic back pain    Chronic kidney disease (CKD)    Constipation    Cough    Diabetes mellitus, type 2 (HCC)    Diverticulosis    Fibroid    patient thinks this was the reason for her hysterectomy   GERD (gastroesophageal reflux disease)    Heart murmur    History of sebaceous cyst    Hyperlipemia    Hyperplastic colon polyp    Hypertension    Hyperthyroidism    Lumbar radiculopathy    Shortness of breath  09/13/2013   Stroke (HCC) 2015?   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    PAST SURGICAL HISTORY: Past Surgical History:  Procedure Laterality Date   ABDOMINAL HYSTERECTOMY     --?ovaries remain   AV FISTULA PLACEMENT Left 07/16/2020   Procedure: LEFT BRACHIOCEPHALIC ARTERIOVENOUS (AV) FISTULA CREATION;  Surgeon: Leonie Douglas, MD;  Location: [facility] OR;  Service: Vascular;  Laterality: Left;  PERIPHERAL NERVE BLOCK   COLONOSCOPY W/ BIOPSIES  09/11/2008   ESOPHAGOGASTRODUODENOSCOPY (EGD) WITH PROPOFOL N/A 05/20/2020   Procedure: ESOPHAGOGASTRODUODENOSCOPY  (EGD) WITH PROPOFOL;  Surgeon: Hilarie Fredrickson, MD;  Location: [facility] ENDOSCOPY;  Service: Endoscopy;  Laterality: N/A;   GIVENS CAPSULE STUDY N/A 06/28/2020   Procedure: GIVENS CAPSULE STUDY;  Surgeon: Hilarie Fredrickson, MD;  Location: [facility] ENDOSCOPY;  Service: Endoscopy;  Laterality: N/A;   RETINOPATHY SURGERY Bilateral 2013   Dr. Ashley Royalty    ALLERGIES: Allergies  Allergen Reactions   Tape Other (See Comments)    Paper tape rips skin.    Semaglutide Rash    Rybelsus 3 mg    FAMILY HISTORY:  Family History  Problem Relation Age of Onset   Hypertension Mother    Sleep apnea Mother    Diabetes Mother    Hyperlipidemia Mother    Lung cancer Father        smoker   Hypertension Sister    Diabetes Sister    Hypertension Brother    Hypertension Sister    Diabetes Brother    Hypertension Brother    Breast cancer Neg Hx    Colon cancer Neg Hx    Esophageal cancer Neg Hx    Pancreatic cancer Neg Hx    Liver disease Neg Hx    Colon polyps Neg Hx     SOCIAL HISTORY: Social History   Socioeconomic History   Marital status: Divorced    Spouse name: Not on file   Number of children: 1   Years of education: 11   Highest education level: Not on file  Occupational History   Occupation: Engineer, drilling: [employer]    Comment: Retired  Tobacco Use   Smoking status: Some Days    Current packs/day: 0.00    Average packs/day: 0.5 packs/day for 35.0 years (17.5 ttl pk-yrs)    Types: Cigarettes    Start date: 07/07/1979    Last attempt to quit: 07/07/2014    Years since quitting: 8.1   Smokeless tobacco: Never   Tobacco comments:    Smoking some again    02/12/2022 patient smokes 1 -2 some days  Vaping Use   Vaping status: Never Used  Substance and Sexual Activity   Alcohol use: No   Drug use: No   Sexual activity: Never    Partners: Male    Birth control/protection: Surgical    Comment: TAH--?ovaries remain  Other Topics Concern   Not on file  Social History  Narrative   Divorced 1 son    Retired Administrator, sports for [employer]   Former smoker no alcohol caffeine or drug use report denies abuse and feels safe at home.   Social Determinants of Health   Financial Resource Strain: Medium Risk (08/23/2019)   Overall Financial Resource Strain (CARDIA)    Difficulty of Paying Living Expenses: Somewhat hard  Food Insecurity: Low Risk  (06/08/2022)   [facility]  Food vital sign    Within the past 12 months, you worried that your food would run out before you got money to buy more: Never true    Within the past 12 months, the food you bought just didn't last and you didn't have money to get more. : Never true  Transportation Needs: Not on file (06/08/2022)  Physical Activity: Sufficiently Active (08/23/2019)   Exercise Vital Sign    Days of Exercise per Week: 5 days    Minutes of Exercise per Session: 30 min  Stress: No Stress Concern Present (08/23/2019)   [employer] of Occupational Health - Occupational Stress Questionnaire    Feeling of Stress : Not at all  Social Connections: Moderately Integrated (07/07/2017)   Social Connection and Isolation Panel [NHANES]    Frequency of Communication with Friends and Family: More than three times a week    Frequency of Social Gatherings with Friends and Family: More than three times a week    Attends Religious Services: More than 4 times per year    Active Member of [employer] or Organizations: Yes    Attends Engineer, structural: More than 4 times per year    Marital Status: Divorced    MEDICATIONS:  Current Outpatient Medications  Medication Sig Dispense Refill   amLODipine (NORVASC) 10 MG tablet TAKE 1 TABLET BY MOUTH EVERY DAY 90 tablet 0   atorvastatin (LIPITOR) 40 MG tablet Take 1 tablet by mouth at bedtime.     B Complex-C-Zn-Folic Acid (DIALYVITE 800-ZINC 15) 0.8 MG TABS Take 1 tablet by mouth daily.     Calcium Acetate 667 MG TABS Take 1 tablet by  mouth daily.     Continuous Blood Gluc Receiver (DEXCOM G7 RECEIVER) DEVI Used to monitor blood sugar. Replace once per year.     Continuous Blood Gluc Sensor (DEXCOM G7 SENSOR) MISC Use to monitor blood sugar. Replace sensor every 10 days.     epoetin alfa-epbx (RETACRIT) 78295 UNIT/ML injection Inject 40,000 Units into the skin every 28 (twenty-eight) days.     glucose blood (ONETOUCH VERIO) test strip USE AS DIRECTED TWICE DAILY 200 strip 3   hydrALAZINE (APRESOLINE) 50 MG tablet Take 1 tablet (50 mg total) by mouth every 8 (eight) hours. (Patient taking differently: Take 50 mg by mouth 2 (two) times daily.) 90 tablet 0   insulin degludec (TRESIBA FLEXTOUCH) 100 UNIT/ML FlexTouch Pen Inject 10 Units into the skin daily. (Patient taking differently: Inject 8 Units into the skin daily.) 15 mL 1   insulin lispro (HUMALOG) 100 UNIT/ML KwikPen 3 to 4 units before each meal 15 mL    isosorbide mononitrate (IMDUR) 30 MG 24 hr tablet Take 1 tablet (30 mg total) by mouth daily. 30 tablet 0   JARDIANCE 10 MG TABS tablet Take 10 mg by mouth daily.     lidocaine (LIDODERM) 5 % Place 1 patch onto the skin every 12 (twelve) hours.     lidocaine-prilocaine (EMLA) cream Apply 1 Application topically See admin instructions. Three times a week     magnesium oxide (MAG-OX) 400 MG tablet Take 400 mg by mouth daily.     metoprolol succinate (TOPROL-XL) 100 MG 24 hr tablet Take 100 mg by mouth daily.     mycophenolate (CELLCEPT) 250 MG capsule Take 250 mg by mouth 2 (two) times daily. Take 4 capsules twice daily     NIFEdipine (PROCARDIA XL/NIFEDICAL XL) 60 MG 24 hr tablet Take 1 tablet by mouth 2 (two) times  daily.     OneTouch Delica Lancets 33G MISC 1 each by Does not apply route 2 (two) times daily. E11.9     oxyCODONE (OXY IR/ROXICODONE) 5 MG immediate release tablet Take 1 tablet by mouth every 6 (six) hours as needed.     oxyCODONE-acetaminophen (PERCOCET) 5-325 MG tablet Take 1 tablet by mouth every 6 (six)  hours as needed for severe pain. 8 tablet 0   pantoprazole (PROTONIX) 40 MG tablet Take 1 tablet (40 mg total) by mouth daily. 90 tablet 3   polyethylene glycol (MIRALAX / GLYCOLAX) 17 g packet Take 34-51 g by mouth daily as needed for mild constipation.     predniSONE (DELTASONE) 5 MG tablet Take 3 tablets by mouth daily.     senna (SENOKOT) 8.6 MG tablet Take 2 tablets by mouth 2 (two) times daily as needed.     sulfamethoxazole-trimethoprim (BACTRIM) 400-80 MG tablet Take 1 tablet by mouth daily.     tacrolimus (PROGRAF) 1 MG capsule Take 6 capsules by mouth.     valGANciclovir (VALCYTE) 450 MG tablet Take 1 tablet by mouth every 12 (twelve) hours.     Vitamin D, Ergocalciferol, (DRISDOL) 1.25 MG (50000 UNIT) CAPS capsule Take 50,000 Units by mouth once a week. For 8 days     acetaminophen (TYLENOL) 325 MG tablet Take 2 tablets by mouth every 6 (six) hours as needed. (Patient not taking: Reported on 09/09/2022)     acetaminophen (TYLENOL) 500 MG tablet Take 1,000 mg by mouth every 6 (six) hours as needed for moderate pain. (Patient not taking: Reported on 09/09/2022)     blood glucose meter kit and supplies KIT Dispense based on patient and insurance preference. Use up to four times daily as directed. (Patient not taking: Reported on 09/09/2022) 1 each 0   Blood Glucose Monitoring Suppl (ONETOUCH VERIO FLEX SYSTEM) w/Device KIT 1 each by Does not apply route 2 (two) times daily. E11.9 (Patient not taking: Reported on 09/09/2022)     No current facility-administered medications for this visit.    PHYSICAL EXAM: There were no vitals filed for this visit. There is no height or weight on file to calculate BMI.  Wt Readings from Last 3 Encounters:  07/15/22 115 lb (52.2 kg)  06/15/22 115 lb (52.2 kg)  05/11/22 112 lb 4.8 oz (50.9 kg)    General: Well developed, well nourished female in no apparent distress.  HEENT: AT/[place], no external lesions.  Eyes: Conjunctiva clear and no icterus. Neck: Neck  supple  Lungs: Respirations not labored Neurologic: Alert, oriented, normal speech Extremities / Skin: Dry. No sores or rashes noted. No acanthosis nigricans Psychiatric: Does not appear depressed or anxious  Diabetic Foot Exam - Simple   No data filed    LABS Reviewed Lab Results  Component Value Date   HGBA1C 7.0 (A) 09/09/2022   HGBA1C 7.4 (A) 05/02/2022   HGBA1C 4.9 01/28/2022   Lab Results  Component Value Date   FRUCTOSAMINE 363 (H) 04/27/2022   FRUCTOSAMINE 315 (H) 01/28/2022   FRUCTOSAMINE 294 (H) 09/29/2021   Lab Results  Component Value Date   CHOL 155 07/11/2018   HDL 44.70 07/11/2018   LDLCALC 80 07/11/2018   LDLDIRECT 69.0 09/29/2021   TRIG 151.0 (H) 07/11/2018   CHOLHDL 3 07/11/2018   Lab Results  Component Value Date   MICRALBCREAT 10.9 08/28/2015   MICRALBCREAT 679.6 (H) 04/09/2008   Lab Results  Component Value Date   CREATININE 1.08 (H) 08/08/2022   Lab  Results  Component Value Date   GFR 25.74 (L) 07/11/2018    ASSESSMENT / PLAN  1. Uncontrolled type 2 diabetes mellitus with hyperglycemia, with long-term current use of insulin (HCC)     Diabetes Mellitus type 2, complicated by diabetic nephropathy, ESRD s/p renal transplant - Diabetic status / severity: controlled.   Lab Results  Component Value Date   HGBA1C 7.0 (A) 09/09/2022    - Hemoglobin A1c goal : <7%  - Medications: No change.  I) continue Tresiba 8 units daily in the morning. II) continue Humalog 3 units with meals 3 times a day.  - Home glucose testing: Dexcom G7 and check as needed. - Discussed/ Gave Hypoglycemia treatment plan.  # Consult : not required at this time.   # She is status post renal transplant, following with nephrology and transplant team.   Last  Lab Results  Component Value Date   MICRALBCREAT 10.9 08/28/2015    # Foot check nightly.  # Annual dilated diabetic eye exams.   - Diet: Eat reasonable portion sizes to promote a healthy weight -  Life style / activity / exercise: Discussed.  2. Blood pressure  -  BP Readings from Last 1 Encounters:  07/15/22 134/72    - Control is in target.  - No change in current plans.  3. Lipid status / Hyperlipidemia - Last  Lab Results  Component Value Date   LDLCALC 80 07/11/2018   - Continue atorvastatin 40 mg daily.  Managed by primary care provider.  Diagnoses and all orders for this visit:  Uncontrolled type 2 diabetes mellitus with hyperglycemia, with long-term current use of insulin (HCC) -     POCT glycosylated hemoglobin (Hb A1C)   DISPOSITION Follow up in clinic in 3  months suggested.   All questions answered and patient verbalized understanding of the plan.  [place] Lazarius Rivkin, MD [facility] Endocrinology [facility] [address] [place], [address] [place], [place] [address] Phone # [phone]  At least part of this note was generated using voice recognition software. Inadvertent word errors may have occurred, which were not recognized during the proofreading process.

## 2022-09-09 NOTE — Patient Instructions (Signed)
No change in insulin same: Tresiba 8 units in the morning. Humalog 3 units with meals three times a day.  Please avoid donuts or sugary snacks.

## 2022-09-14 ENCOUNTER — Other Ambulatory Visit (HOSPITAL_COMMUNITY)
Admission: AD | Admit: 2022-09-14 | Discharge: 2022-09-14 | Disposition: A | Discharge status: Home / Self Care | Payer: Medicare Other | Source: Ambulatory Visit

## 2022-09-14 DIAGNOSIS — Z79899 Other long term (current) drug therapy: Secondary | ICD-10-CM | POA: Insufficient documentation

## 2022-09-14 DIAGNOSIS — Z940 Kidney transplant status: Secondary | ICD-10-CM | POA: Insufficient documentation

## 2022-09-14 LAB — COMPREHENSIVE METABOLIC PANEL
ALT: 40 U/L (ref 0–44)
AST: 33 U/L (ref 15–41)
Albumin: 3.8 g/dL (ref 3.5–5.0)
Alkaline Phosphatase: 74 U/L (ref 38–126)
Anion gap: 11 (ref 5–15)
BUN: 20 mg/dL (ref 8–23)
CO2: 24 mmol/L (ref 22–32)
Calcium: 9.4 mg/dL (ref 8.9–10.3)
Chloride: 98 mmol/L (ref 98–111)
Creatinine, Ser: 1.09 mg/dL — ABNORMAL HIGH (ref 0.44–1.00)
GFR, Estimated: 54 mL/min — ABNORMAL LOW (ref >60)
Glucose, Bld: 156 mg/dL — ABNORMAL HIGH (ref 70–99)
Potassium: 3.8 mmol/L (ref 3.5–5.1)
Sodium: 133 mmol/L — ABNORMAL LOW (ref 135–145)
Total Bilirubin: 0.9 mg/dL (ref 0.3–1.2)
Total Protein: 6.8 g/dL (ref 6.5–8.1)

## 2022-09-14 LAB — CBC WITH DIFFERENTIAL/PLATELET
Abs Immature Granulocytes: 0.03 10*3/uL (ref 0.00–0.07)
Basophils Absolute: 0 10*3/uL (ref 0.0–0.1)
Basophils Relative: 1 %
Eosinophils Absolute: 0.1 10*3/uL (ref 0.0–0.5)
Eosinophils Relative: 1 %
HCT: 44.6 % (ref 36.0–46.0)
Hemoglobin: 14.9 g/dL (ref 12.0–15.0)
Immature Granulocytes: 1 %
Lymphocytes Relative: 14 %
Lymphs Abs: 0.8 10*3/uL (ref 0.7–4.0)
MCH: 35.1 pg — ABNORMAL HIGH (ref 26.0–34.0)
MCHC: 33.4 g/dL (ref 30.0–36.0)
MCV: 104.9 fL — ABNORMAL HIGH (ref 80.0–100.0)
Monocytes Absolute: 0.7 10*3/uL (ref 0.1–1.0)
Monocytes Relative: 13 %
Neutro Abs: 4 10*3/uL (ref 1.7–7.7)
Neutrophils Relative %: 70 %
Platelets: 176 10*3/uL (ref 150–400)
RBC: 4.25 MIL/uL (ref 3.87–5.11)
RDW: 12.2 % (ref 11.5–15.5)
WBC: 5.7 10*3/uL (ref 4.0–10.5)
nRBC: 0 % (ref 0.0–0.2)

## 2022-09-14 LAB — MAGNESIUM: Magnesium: 1.8 mg/dL (ref 1.7–2.4)

## 2022-09-15 LAB — BK QUANT PCR (PLASMA/SERUM)
BK Quantitaion PCR: 28 IU/mL
Log10 BK Qn PCR: 1.447 {Log_IU}/mL

## 2022-09-16 LAB — TACROLIMUS LEVEL: Tacrolimus (FK506) - LabCorp: 5.2 ng/mL (ref 2.0–20.0)

## 2022-09-20 ENCOUNTER — Encounter: Payer: Self-pay | Admitting: Endocrinology

## 2022-10-11 ENCOUNTER — Other Ambulatory Visit (HOSPITAL_COMMUNITY)
Admission: AD | Admit: 2022-10-11 | Discharge: 2022-10-11 | Disposition: A | Discharge status: Home / Self Care | Payer: Medicare Other | Source: Ambulatory Visit | Attending: *Deleted | Admitting: *Deleted

## 2022-10-11 DIAGNOSIS — Z79899 Other long term (current) drug therapy: Secondary | ICD-10-CM | POA: Diagnosis not present

## 2022-10-11 DIAGNOSIS — Z940 Kidney transplant status: Secondary | ICD-10-CM | POA: Diagnosis present

## 2022-10-11 LAB — COMPREHENSIVE METABOLIC PANEL
ALT: 40 U/L (ref 0–44)
AST: 30 U/L (ref 15–41)
Albumin: 3.9 g/dL (ref 3.5–5.0)
Alkaline Phosphatase: 76 U/L (ref 38–126)
Anion gap: 12 (ref 5–15)
BUN: 19 mg/dL (ref 8–23)
CO2: 22 mmol/L (ref 22–32)
Calcium: 9.6 mg/dL (ref 8.9–10.3)
Chloride: 101 mmol/L (ref 98–111)
Creatinine, Ser: 1.02 mg/dL — ABNORMAL HIGH (ref 0.44–1.00)
GFR, Estimated: 58 mL/min — ABNORMAL LOW (ref >60)
Glucose, Bld: 114 mg/dL — ABNORMAL HIGH (ref 70–99)
Potassium: 3.8 mmol/L (ref 3.5–5.1)
Sodium: 135 mmol/L (ref 135–145)
Total Bilirubin: 0.9 mg/dL (ref 0.3–1.2)
Total Protein: 6.9 g/dL (ref 6.5–8.1)

## 2022-10-11 LAB — CBC WITH DIFFERENTIAL/PLATELET
Abs Immature Granulocytes: 0.02 10*3/uL (ref 0.00–0.07)
Basophils Absolute: 0 10*3/uL (ref 0.0–0.1)
Basophils Relative: 1 %
Eosinophils Absolute: 0.1 10*3/uL (ref 0.0–0.5)
Eosinophils Relative: 2 %
HCT: 44.4 % (ref 36.0–46.0)
Hemoglobin: 14.7 g/dL (ref 12.0–15.0)
Immature Granulocytes: 0 %
Lymphocytes Relative: 14 %
Lymphs Abs: 0.8 10*3/uL (ref 0.7–4.0)
MCH: 33.7 pg (ref 26.0–34.0)
MCHC: 33.1 g/dL (ref 30.0–36.0)
MCV: 101.8 fL — ABNORMAL HIGH (ref 80.0–100.0)
Monocytes Absolute: 0.8 10*3/uL (ref 0.1–1.0)
Monocytes Relative: 16 %
Neutro Abs: 3.5 10*3/uL (ref 1.7–7.7)
Neutrophils Relative %: 67 %
Platelets: 180 10*3/uL (ref 150–400)
RBC: 4.36 MIL/uL (ref 3.87–5.11)
RDW: 11.9 % (ref 11.5–15.5)
WBC: 5.3 10*3/uL (ref 4.0–10.5)
nRBC: 0 % (ref 0.0–0.2)

## 2022-10-11 LAB — PHOSPHORUS: Phosphorus: 3.3 mg/dL (ref 2.5–4.6)

## 2022-10-11 LAB — MAGNESIUM: Magnesium: 1.9 mg/dL (ref 1.7–2.4)

## 2022-10-13 LAB — TACROLIMUS LEVEL: Tacrolimus (FK506) - LabCorp: 5.4 ng/mL (ref 2.0–20.0)

## 2022-10-18 ENCOUNTER — Encounter: Payer: Self-pay | Admitting: Podiatry

## 2022-10-18 ENCOUNTER — Ambulatory Visit (INDEPENDENT_AMBULATORY_CARE_PROVIDER_SITE_OTHER): Payer: Medicare Other | Admitting: Podiatry

## 2022-10-18 DIAGNOSIS — B351 Tinea unguium: Secondary | ICD-10-CM | POA: Diagnosis not present

## 2022-10-18 DIAGNOSIS — Z940 Kidney transplant status: Secondary | ICD-10-CM

## 2022-10-18 DIAGNOSIS — M79675 Pain in left toe(s): Secondary | ICD-10-CM

## 2022-10-18 DIAGNOSIS — E1151 Type 2 diabetes mellitus with diabetic peripheral angiopathy without gangrene: Secondary | ICD-10-CM | POA: Diagnosis not present

## 2022-10-18 DIAGNOSIS — M79674 Pain in right toe(s): Secondary | ICD-10-CM | POA: Diagnosis not present

## 2022-10-18 DIAGNOSIS — E119 Type 2 diabetes mellitus without complications: Secondary | ICD-10-CM

## 2022-10-19 NOTE — Progress Notes (Signed)
Subjective:  Patient ID: [name], female    DOB: [birth year],  MRN: [id number]  KAMAR TOLSON presents to clinic today for for annual diabetic foot examination. Patient s/p kidney transplant with h/o diabetes. Chief Complaint  Patient presents with   Diabetes    [facility] BS - 165 A1C-7.1  LVPCP - 07/2022    New problem(s): None.   PCP is Default, Provider, MD.  Allergies  Allergen Reactions   Tape Other (See Comments)    Paper tape rips skin.    Semaglutide Rash    Rybelsus 3 mg    Review of Systems: Negative except as noted in the HPI.  Objective: No changes noted in today's physical examination. There were no vitals filed for this visit. [name] is a pleasant 72 y.o. female WD, WN in NAD. AAO x 3.  Vascular Examination: CFT <3 seconds b/l. DP pulses faintly palpable b/l. PT pulses nonpalpable b/l. Digital hair absent. Skin temperature gradient warm to warm b/l. No pain with calf compression. No ischemia or gangrene. No cyanosis or clubbing noted b/l. No edema b/l.  Neurological Examination: Sensation grossly intact b/l with 10 gram monofilament. Vibratory sensation intact b/l.   Dermatological Examination: Pedal skin warm and supple b/l. No open wounds b/l. No interdigital macerations. Toenails 1-5 b/l thick, discolored, elongated with subungual debris and pain on dorsal palpation.  No hyperkeratotic nor porokeratotic lesions present on today's visit.  Incurvated nailplate both borders of left hallux and both borders of right hallux with tenderness to palpation. No erythema, no edema, no drainage noted. No fluctuance.   Musculoskeletal Examination: Normal muscle strength 5/5 to all lower extremity muscle groups bilaterally. No pain, crepitus or joint limitation noted with ROM b/l LE. No gross bony pedal deformities b/l. Patient ambulates independently without assistive aids.  Radiographs: None  Assessment/Plan: 1. Pain due to onychomycosis of  toenails of both feet   2. Renal transplant recipient   3. Type II diabetes mellitus with peripheral circulatory disorder (HCC)   4. Encounter for diabetic foot exam (HCC)    -Consent given for treatment as described below: -Examined patient. -Diabetic foot examination performed today. -Continue diabetic foot care principles: inspect feet daily, monitor glucose as recommended by PCP and/or Endocrinologist, and follow prescribed diet per PCP, Endocrinologist and/or dietician. -Patient to continue soft, supportive shoe gear daily. -Toenails were debrided in length and girth 2-5 bilaterally with sterile nail nippers and dremel without iatrogenic bleeding.  -No invasive procedure(s) performed. Offending nail border debrided and curretaged both borders of left hallux and both borders of right hallux utilizing sterile nail nipper and currette. Border cleansed with alcohol and TAO applied. No further treatment required by patient/caregiver. Call office if there are any concerns. -Patient/POA to call should there be question/concern in the interim.   No follow-ups on file.  Freddie Breech, DPM

## 2022-11-25 ENCOUNTER — Telehealth: Payer: Self-pay | Admitting: Endocrinology

## 2022-11-25 NOTE — Telephone Encounter (Signed)
MEDICATION: Jardiance   PHARMACY:  [facility] on [address]  HAS THE PATIENT CONTACTED THEIR PHARMACY?  NO  IS THIS A 90 DAY SUPPLY : YES  IS PATIENT OUT OF MEDICATION: NO  Call Back - [phone]

## 2022-12-01 ENCOUNTER — Ambulatory Visit: Payer: Medicare Other | Admitting: Internal Medicine

## 2022-12-12 ENCOUNTER — Other Ambulatory Visit (HOSPITAL_COMMUNITY)
Admission: RE | Admit: 2022-12-12 | Discharge: 2022-12-12 | Disposition: A | Discharge status: Home / Self Care | Payer: Medicare Other | Source: Ambulatory Visit

## 2022-12-12 DIAGNOSIS — Z940 Kidney transplant status: Secondary | ICD-10-CM | POA: Insufficient documentation

## 2022-12-12 DIAGNOSIS — Z79899 Other long term (current) drug therapy: Secondary | ICD-10-CM | POA: Diagnosis not present

## 2022-12-12 LAB — CBC WITH DIFFERENTIAL/PLATELET
Abs Immature Granulocytes: 0.02 10*3/uL (ref 0.00–0.07)
Basophils Absolute: 0.1 10*3/uL (ref 0.0–0.1)
Basophils Relative: 1 %
Eosinophils Absolute: 0.1 10*3/uL (ref 0.0–0.5)
Eosinophils Relative: 2 %
HCT: 41.8 % (ref 36.0–46.0)
Hemoglobin: 14 g/dL (ref 12.0–15.0)
Immature Granulocytes: 0 %
Lymphocytes Relative: 16 %
Lymphs Abs: 0.9 10*3/uL (ref 0.7–4.0)
MCH: 33.3 pg (ref 26.0–34.0)
MCHC: 33.5 g/dL (ref 30.0–36.0)
MCV: 99.3 fL (ref 80.0–100.0)
Monocytes Absolute: 0.7 10*3/uL (ref 0.1–1.0)
Monocytes Relative: 12 %
Neutro Abs: 3.9 10*3/uL (ref 1.7–7.7)
Neutrophils Relative %: 69 %
Platelets: 248 10*3/uL (ref 150–400)
RBC: 4.21 MIL/uL (ref 3.87–5.11)
RDW: 12.5 % (ref 11.5–15.5)
WBC: 5.6 10*3/uL (ref 4.0–10.5)
nRBC: 0 % (ref 0.0–0.2)

## 2022-12-12 LAB — COMPREHENSIVE METABOLIC PANEL
ALT: 33 U/L (ref 0–44)
AST: 27 U/L (ref 15–41)
Albumin: 3.9 g/dL (ref 3.5–5.0)
Alkaline Phosphatase: 75 U/L (ref 38–126)
Anion gap: 9 (ref 5–15)
BUN: 22 mg/dL (ref 8–23)
CO2: 25 mmol/L (ref 22–32)
Calcium: 10 mg/dL (ref 8.9–10.3)
Chloride: 102 mmol/L (ref 98–111)
Creatinine, Ser: 0.95 mg/dL (ref 0.44–1.00)
GFR, Estimated: >60 mL/min (ref >60)
Glucose, Bld: 161 mg/dL — ABNORMAL HIGH (ref 70–99)
Potassium: 3.9 mmol/L (ref 3.5–5.1)
Sodium: 136 mmol/L (ref 135–145)
Total Bilirubin: 0.8 mg/dL (ref <1.2)
Total Protein: 7.2 g/dL (ref 6.5–8.1)

## 2022-12-12 LAB — MAGNESIUM: Magnesium: 1.7 mg/dL (ref 1.7–2.4)

## 2022-12-12 LAB — PHOSPHORUS: Phosphorus: 3.6 mg/dL (ref 2.5–4.6)

## 2022-12-13 LAB — TACROLIMUS LEVEL: Tacrolimus (FK506) - LabCorp: 5.8 ng/mL (ref 2.0–20.0)

## 2022-12-13 LAB — BK QUANT PCR (PLASMA/SERUM): BK Quantitaion PCR: NEGATIVE [IU]/mL

## 2023-01-10 ENCOUNTER — Other Ambulatory Visit (HOSPITAL_COMMUNITY)
Admission: RE | Admit: 2023-01-10 | Discharge: 2023-01-10 | Disposition: A | Discharge status: Home / Self Care | Payer: Medicare Other | Source: Ambulatory Visit | Attending: *Deleted | Admitting: *Deleted

## 2023-01-10 DIAGNOSIS — Z940 Kidney transplant status: Secondary | ICD-10-CM | POA: Insufficient documentation

## 2023-01-10 LAB — CBC WITH DIFFERENTIAL/PLATELET
Abs Immature Granulocytes: 0.03 10*3/uL (ref 0.00–0.07)
Basophils Absolute: 0 10*3/uL (ref 0.0–0.1)
Basophils Relative: 1 %
Eosinophils Absolute: 0.1 10*3/uL (ref 0.0–0.5)
Eosinophils Relative: 2 %
HCT: 43.5 % (ref 36.0–46.0)
Hemoglobin: 14.3 g/dL (ref 12.0–15.0)
Immature Granulocytes: 1 %
Lymphocytes Relative: 21 %
Lymphs Abs: 1 10*3/uL (ref 0.7–4.0)
MCH: 32.7 pg (ref 26.0–34.0)
MCHC: 32.9 g/dL (ref 30.0–36.0)
MCV: 99.5 fL (ref 80.0–100.0)
Monocytes Absolute: 0.7 10*3/uL (ref 0.1–1.0)
Monocytes Relative: 15 %
Neutro Abs: 2.9 10*3/uL (ref 1.7–7.7)
Neutrophils Relative %: 60 %
Platelets: 209 10*3/uL (ref 150–400)
RBC: 4.37 MIL/uL (ref 3.87–5.11)
RDW: 12.6 % (ref 11.5–15.5)
WBC: 4.8 10*3/uL (ref 4.0–10.5)
nRBC: 0 % (ref 0.0–0.2)

## 2023-01-10 LAB — PHOSPHORUS: Phosphorus: 3.3 mg/dL (ref 2.5–4.6)

## 2023-01-10 LAB — COMPREHENSIVE METABOLIC PANEL
ALT: 40 U/L (ref 0–44)
AST: 30 U/L (ref 15–41)
Albumin: 3.9 g/dL (ref 3.5–5.0)
Alkaline Phosphatase: 77 U/L (ref 38–126)
Anion gap: 9 (ref 5–15)
BUN: 22 mg/dL (ref 8–23)
CO2: 24 mmol/L (ref 22–32)
Calcium: 9.8 mg/dL (ref 8.9–10.3)
Chloride: 102 mmol/L (ref 98–111)
Creatinine, Ser: 1.13 mg/dL — ABNORMAL HIGH (ref 0.44–1.00)
GFR, Estimated: 52 mL/min — ABNORMAL LOW (ref >60)
Glucose, Bld: 155 mg/dL — ABNORMAL HIGH (ref 70–99)
Potassium: 3.6 mmol/L (ref 3.5–5.1)
Sodium: 135 mmol/L (ref 135–145)
Total Bilirubin: 0.8 mg/dL (ref <1.2)
Total Protein: 6.8 g/dL (ref 6.5–8.1)

## 2023-01-10 LAB — MAGNESIUM: Magnesium: 2 mg/dL (ref 1.7–2.4)

## 2023-01-11 LAB — BK QUANT PCR (PLASMA/SERUM): BK Quantitaion PCR: POSITIVE [IU]/mL

## 2023-01-11 LAB — TACROLIMUS LEVEL: Tacrolimus (FK506) - LabCorp: 5.8 ng/mL (ref 2.0–20.0)

## 2023-01-20 ENCOUNTER — Other Ambulatory Visit: Payer: Self-pay | Admitting: Physician Assistant

## 2023-01-20 DIAGNOSIS — Z1231 Encounter for screening mammogram for malignant neoplasm of breast: Secondary | ICD-10-CM

## 2023-02-01 ENCOUNTER — Ambulatory Visit (INDEPENDENT_AMBULATORY_CARE_PROVIDER_SITE_OTHER): Payer: Medicare Other | Admitting: Podiatry

## 2023-02-01 ENCOUNTER — Encounter: Payer: Self-pay | Admitting: Podiatry

## 2023-02-01 DIAGNOSIS — M79675 Pain in left toe(s): Secondary | ICD-10-CM | POA: Diagnosis not present

## 2023-02-01 DIAGNOSIS — B351 Tinea unguium: Secondary | ICD-10-CM

## 2023-02-01 DIAGNOSIS — M79674 Pain in right toe(s): Secondary | ICD-10-CM | POA: Diagnosis not present

## 2023-02-01 DIAGNOSIS — E1151 Type 2 diabetes mellitus with diabetic peripheral angiopathy without gangrene: Secondary | ICD-10-CM | POA: Diagnosis not present

## 2023-02-01 DIAGNOSIS — Z940 Kidney transplant status: Secondary | ICD-10-CM

## 2023-02-08 ENCOUNTER — Encounter: Payer: Self-pay | Admitting: Podiatry

## 2023-02-08 NOTE — Progress Notes (Signed)
  Subjective:  Patient ID: [name], female    DOB: [birth year],  MRN: [id number]  [name] presents to clinic today for at risk foot care. Pt has h/o NIDDM with PAD and painful elongated mycotic toenails 1-5 bilaterally which are tender when wearing enclosed shoe gear. Pain is relieved with periodic professional debridement.  Chief Complaint  Patient presents with   [facility]    RM#15 DFC A1C patient unsure but believes it was in the range of 6.2 BS 135 this am.    PCP is Latisha Ronal Crank, PA-C. ARNETTA 08/26/2022.  Allergies  Allergen Reactions   Tape Other (See Comments)    Paper tape rips skin.    Semaglutide  Rash    Rybelsus  3 mg    Review of Systems: Negative except as noted in the HPI.  Objective: No changes noted in today's physical examination. There were no vitals filed for this visit. [name] is a pleasant 73 y.o. female WD, WN in NAD. AAO x 3.  Vascular Examination: CFT <3 seconds b/l. DP pulses faintly palpable b/l. PT pulses nonpalpable b/l. Digital hair absent. Skin temperature gradient warm to warm b/l. No pain with calf compression. No ischemia or gangrene. No cyanosis or clubbing noted b/l.    Neurological Examination: Sensation grossly intact b/l with 10 gram monofilament.   Dermatological Examination: Pedal skin warm and supple b/l. No open wounds b/l. No interdigital macerations. Toenails 1-5 b/l thick, discolored, elongated with subungual debris and pain on dorsal palpation.  No hyperkeratotic nor porokeratotic lesions present on today's visit.  Musculoskeletal Examination: Muscle strength 5/5 to all lower extremity muscle groups bilaterally. No pain, crepitus or joint limitation noted with ROM bilateral LE. No gross bony deformities bilaterally.  Radiographs: None  Last HgA1c:      Latest Ref Rng & Units 09/09/2022   10:34 AM 05/02/2022    3:07 PM  Hemoglobin A1C  Hemoglobin-A1c 4.0 - 5.6 % 7.0  7.4    Assessment/Plan: 1.  Pain due to onychomycosis of toenails of both feet   2. Renal transplant recipient   3. Type II diabetes mellitus with peripheral circulatory disorder [facility])     -Patient was evaluated today. All questions/concerns addressed on today's visit. -Continue foot and shoe inspections daily. Monitor blood glucose per PCP/Endocrinologist's recommendations. -Continue supportive shoe gear daily. -Mycotic toenails 1-5 bilaterally were debrided in length and girth with sterile nail nippers and dremel without incident. -Patient/POA to call should there be question/concern in the interim.   Return in about 3 months (around 05/02/2023).  Delon LITTIE Merlin, DPM      [facility] LOCATION: [address] [place] [address]                   Office [phone]   [facility] LOCATION: [address] [place], [place] [address] Office [phone]

## 2023-02-20 ENCOUNTER — Ambulatory Visit
Admission: RE | Admit: 2023-02-20 | Discharge: 2023-02-20 | Disposition: A | Discharge status: Home / Self Care | Payer: Medicare Other | Source: Ambulatory Visit | Attending: Physician Assistant | Admitting: Physician Assistant

## 2023-02-20 ENCOUNTER — Other Ambulatory Visit (HOSPITAL_COMMUNITY)
Admission: AD | Admit: 2023-02-20 | Discharge: 2023-02-20 | Disposition: A | Discharge status: Home / Self Care | Payer: Medicare Other | Source: Ambulatory Visit | Attending: *Deleted | Admitting: *Deleted

## 2023-02-20 DIAGNOSIS — Z940 Kidney transplant status: Secondary | ICD-10-CM | POA: Diagnosis present

## 2023-02-20 DIAGNOSIS — Z1231 Encounter for screening mammogram for malignant neoplasm of breast: Secondary | ICD-10-CM

## 2023-02-20 DIAGNOSIS — Z79899 Other long term (current) drug therapy: Secondary | ICD-10-CM | POA: Diagnosis not present

## 2023-02-20 LAB — COMPREHENSIVE METABOLIC PANEL
ALT: 29 U/L (ref 0–44)
AST: 29 U/L (ref 15–41)
Albumin: 3.4 g/dL — ABNORMAL LOW (ref 3.5–5.0)
Alkaline Phosphatase: 78 U/L (ref 38–126)
Anion gap: 8 (ref 5–15)
BUN: 11 mg/dL (ref 8–23)
CO2: 20 mmol/L — ABNORMAL LOW (ref 22–32)
Calcium: 9.2 mg/dL (ref 8.9–10.3)
Chloride: 109 mmol/L (ref 98–111)
Creatinine, Ser: 0.94 mg/dL (ref 0.44–1.00)
GFR, Estimated: >60 mL/min (ref >60)
Glucose, Bld: 98 mg/dL (ref 70–99)
Potassium: 4.1 mmol/L (ref 3.5–5.1)
Sodium: 137 mmol/L (ref 135–145)
Total Bilirubin: 0.9 mg/dL (ref 0.0–1.2)
Total Protein: 6.2 g/dL — ABNORMAL LOW (ref 6.5–8.1)

## 2023-02-20 LAB — CBC WITH DIFFERENTIAL/PLATELET
Abs Immature Granulocytes: 0.03 10*3/uL (ref 0.00–0.07)
Basophils Absolute: 0 10*3/uL (ref 0.0–0.1)
Basophils Relative: 1 %
Eosinophils Absolute: 0.1 10*3/uL (ref 0.0–0.5)
Eosinophils Relative: 2 %
HCT: 39.8 % (ref 36.0–46.0)
Hemoglobin: 12.9 g/dL (ref 12.0–15.0)
Immature Granulocytes: 1 %
Lymphocytes Relative: 19 %
Lymphs Abs: 0.8 10*3/uL (ref 0.7–4.0)
MCH: 32.4 pg (ref 26.0–34.0)
MCHC: 32.4 g/dL (ref 30.0–36.0)
MCV: 100 fL (ref 80.0–100.0)
Monocytes Absolute: 0.7 10*3/uL (ref 0.1–1.0)
Monocytes Relative: 17 %
Neutro Abs: 2.4 10*3/uL (ref 1.7–7.7)
Neutrophils Relative %: 60 %
Platelets: 178 10*3/uL (ref 150–400)
RBC: 3.98 MIL/uL (ref 3.87–5.11)
RDW: 13 % (ref 11.5–15.5)
WBC: 4 10*3/uL (ref 4.0–10.5)
nRBC: 0 % (ref 0.0–0.2)

## 2023-02-20 LAB — MAGNESIUM: Magnesium: 1.8 mg/dL (ref 1.7–2.4)

## 2023-02-20 LAB — PHOSPHORUS: Phosphorus: 3.5 mg/dL (ref 2.5–4.6)

## 2023-02-21 LAB — BK QUANT PCR (PLASMA/SERUM): BK Quantitaion PCR: NEGATIVE [IU]/mL

## 2023-02-22 ENCOUNTER — Other Ambulatory Visit: Payer: Self-pay | Admitting: Endocrinology

## 2023-02-22 ENCOUNTER — Other Ambulatory Visit: Payer: Self-pay

## 2023-02-22 DIAGNOSIS — E1165 Type 2 diabetes mellitus with hyperglycemia: Secondary | ICD-10-CM

## 2023-02-22 LAB — TACROLIMUS LEVEL: Tacrolimus (FK506) - LabCorp: 5.5 ng/mL (ref 2.0–20.0)

## 2023-02-22 MED ORDER — TRESIBA FLEXTOUCH 100 UNIT/ML ~~LOC~~ SOPN: 10.0000 [IU] | PEN_INJECTOR | Freq: Every day | SUBCUTANEOUS | 1 refills | Status: DC

## 2023-02-28 ENCOUNTER — Ambulatory Visit: Payer: Medicare Other | Admitting: Endocrinology

## 2023-02-28 ENCOUNTER — Encounter: Payer: Self-pay | Admitting: Endocrinology

## 2023-02-28 DIAGNOSIS — E1165 Type 2 diabetes mellitus with hyperglycemia: Secondary | ICD-10-CM | POA: Diagnosis not present

## 2023-02-28 DIAGNOSIS — Z794 Long term (current) use of insulin: Secondary | ICD-10-CM | POA: Diagnosis not present

## 2023-02-28 NOTE — Progress Notes (Signed)
 Outpatient Endocrinology Note [name], MD  02/28/23  Patient's Name: [name]    DOB: Sep 23, 1950    MRN: [id number]                                                    REASON OF VISIT: Follow up for type 2 diabetes mellitus  PCP: Latisha Ronal Crank, PA-C  HISTORY OF PRESENT ILLNESS:   [name] is a 73 y.o. old female with past medical history listed below, is here for follow up of type 2 diabetes mellitus.   Pertinent Diabetes History: Patient was diagnosed with type 2 diabetes mellitus in 2007.  Patient had transplant in January 2024 in [place] .  Patient used to be on oral antidiabetic medication in the past.  She is currently on basal bolus insulin  regimen.  Chronic Diabetes Complications : Retinopathy: yes, no records available to review.  Last ophthalmology exam was done on annually, reportedly. 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 Peripheral neuropathy: no Coronary artery disease: no Stroke: no  Relevant comorbidities and cardiovascular risk factors: Obesity: no There is no height or weight on file to calculate BMI.  Hypertension: yes Hyperlipidemia. yes  Current / Home Diabetic regimen includes: Tresiba  8 units in the morning. Humalog  3 - 5 units with meals 3 times a day.  Prior diabetic medications: Metformin , repaglinide , Actos.  Glycemic data:    CONTINUOUS GLUCOSE MONITORING SYSTEM (CGMS) INTERPRETATION: At today's visit, we reviewed CGM downloads. The full report is scanned in the media. Reviewing the CGM trends, blood glucose are as follows:  Dexcom G7 CGM-  Sensor Download (Sensor download was reviewed and summarized below.) Dates: January 22 to February 28, 2023, 7 days  Glucose Management Indicator: 7.4%    Interpretation: Frequent hyperglycemia with blood sugar in 200-250 range especially at bedtime related to bedtime snack, occasionally with lunch and supper.   Blood sugar overnight trending down by the early morning.  No hypoglycemia.  Hypoglycemia: Patient has no hypoglycemic episodes. Patient has hypoglycemia awareness.   Factors modifying glucose control: 1.  Diabetic diet assessment: Eating 3 meals a day and sometimes eats Diet Coke and donut.  Bedtime snacks.  2.  Staying active or exercising: No formal exercise.  3.  Medication compliance: compliant all of the time.  Interval history Patient reports she had PCP visit at [facility], hemoglobin A1c checked at that time was 7.2% reportedly, no records available to review.  GMI on CGM 7.4%.  CGM data as reviewed above.  She is mostly having hyperglycemia in the evening and bedtime due to snacks, and soft drinks.  Diabetes regimen reviewed and as noted above.  Patient reports she has been taking Humalog  up to 5 units with meals due to hyperglycemia.  Occasional numbness and ting of the feet.  No other complaints today.  REVIEW OF SYSTEMS As per history of present illness.   PAST MEDICAL HISTORY: Past Medical History:  Diagnosis Date   Arthritis    Chronic back pain    Chronic kidney disease (CKD)    Constipation    Cough    Diabetes mellitus, type 2 (HCC)    Diverticulosis    Fibroid    patient thinks this was the reason for her hysterectomy   GERD (gastroesophageal reflux disease)  Heart murmur    History of sebaceous cyst    Hyperlipemia    Hyperplastic colon polyp    Hypertension    Hyperthyroidism    Lumbar radiculopathy    Shortness of breath 09/13/2013   Stroke (HCC) 2015?   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    PAST SURGICAL HISTORY: Past Surgical History:  Procedure Laterality Date   ABDOMINAL HYSTERECTOMY     --?ovaries remain   AV FISTULA PLACEMENT Left 07/16/2020   Procedure: LEFT BRACHIOCEPHALIC ARTERIOVENOUS (AV) FISTULA CREATION;  Surgeon: Magda Debby SAILOR, MD;  Location: [facility] OR;  Service:  Vascular;  Laterality: Left;  PERIPHERAL NERVE BLOCK   COLONOSCOPY W/ BIOPSIES  09/11/2008   ESOPHAGOGASTRODUODENOSCOPY (EGD) WITH PROPOFOL  N/A 05/20/2020   Procedure: ESOPHAGOGASTRODUODENOSCOPY (EGD) WITH PROPOFOL ;  Surgeon: Abran Norleen SAILOR, MD;  Location: [facility] ENDOSCOPY;  Service: Endoscopy;  Laterality: N/A;   GIVENS CAPSULE STUDY N/A 06/28/2020   Procedure: GIVENS CAPSULE STUDY;  Surgeon: Abran Norleen SAILOR, MD;  Location: [facility] ENDOSCOPY;  Service: Endoscopy;  Laterality: N/A;   RETINOPATHY SURGERY Bilateral 2013   Dr. alvia    ALLERGIES: Allergies  Allergen Reactions   Tape Other (See Comments)    Paper tape rips skin.    Semaglutide  Rash    Rybelsus  3 mg    FAMILY HISTORY:  Family History  Problem Relation Age of Onset   Hypertension Mother    Sleep apnea Mother    Diabetes Mother    Hyperlipidemia Mother    Lung cancer Father        smoker   Hypertension Sister    Diabetes Sister    Hypertension Sister    Hypertension Brother    Diabetes Brother    Hypertension Brother    Breast cancer Neg Hx    Colon cancer Neg Hx    Esophageal cancer Neg Hx    Pancreatic cancer Neg Hx    Liver disease Neg Hx    Colon polyps Neg Hx    BRCA 1/2 Neg Hx     SOCIAL HISTORY: Social History   Socioeconomic History   Marital status: Divorced    Spouse name: Not on file   Number of children: 1   Years of education: 11   Highest education level: Not on file  Occupational History   Occupation: Engineer, Drilling: [employer]    Comment: Retired  Tobacco Use   Smoking status: Some Days    Current packs/day: 0.00    Average packs/day: 0.5 packs/day for 35.0 years (17.5 ttl pk-yrs)    Types: Cigarettes    Start date: 07/07/1979    Last attempt to quit: 07/07/2014    Years since quitting: 8.6   Smokeless tobacco: Never   Tobacco comments:    Smoking some again    02/12/2022 patient smokes 1 -2 some days  Vaping Use   Vaping status: Never Used  Substance and Sexual  Activity   Alcohol use: No   Drug use: No   Sexual activity: Never    Partners: Male    Birth control/protection: Surgical    Comment: TAH--?ovaries remain  Other Topics Concern   Not on file  Social History Narrative   Divorced 1 son    Retired Administrator, Sports for [employer]   Former smoker no alcohol caffeine or drug use report denies abuse and feels safe at home.   Social  Drivers of Health   Financial Resource Strain: Medium Risk (08/23/2019)   Overall Financial Resource Strain (CARDIA)    Difficulty of Paying Living Expenses: Somewhat hard  Food Insecurity: Low Risk  (06/08/2022)   [facility]   Hunger Vital Sign    Worried About Running Out of Food in the Last Year: Never true    Ran Out of Food in the Last Year: Never true  Transportation Needs: Not on file (06/08/2022)  Physical Activity: Sufficiently Active (08/23/2019)   Exercise Vital Sign    Days of Exercise per Week: 5 days    Minutes of Exercise per Session: 30 min  Stress: No Stress Concern Present (08/23/2019)   [employer] of Occupational Health - Occupational Stress Questionnaire    Feeling of Stress : Not at all  Social Connections: Moderately Integrated (07/07/2017)   Social Connection and Isolation Panel [NHANES]    Frequency of Communication with Friends and Family: More than three times a week    Frequency of Social Gatherings with Friends and Family: More than three times a week    Attends Religious Services: More than 4 times per year    Active Member of [employer] or Organizations: Yes    Attends Engineer, Structural: More than 4 times per year    Marital Status: Divorced    MEDICATIONS:  Current Outpatient Medications  Medication Sig Dispense Refill   acetaminophen  (TYLENOL ) 325 MG tablet Take 2 tablets by mouth every 6 (six) hours as needed.     acetaminophen  (TYLENOL ) 500 MG tablet Take 1,000 mg by mouth every 6 (six) hours as needed for moderate pain  (pain score 4-6).     amLODipine  (NORVASC ) 10 MG tablet TAKE 1 TABLET BY MOUTH EVERY DAY 90 tablet 0   atorvastatin  (LIPITOR ) 40 MG tablet Take 1 tablet by mouth at bedtime.     B Complex-C-Zn-Folic Acid  (DIALYVITE 800-ZINC 15) 0.8 MG TABS Take 1 tablet by mouth daily.     blood glucose meter kit and supplies KIT Dispense based on patient and insurance preference. Use up to four times daily as directed. 1 each 0   Blood Glucose Monitoring Suppl (ONETOUCH VERIO FLEX SYSTEM) w/Device KIT 1 each by Does not apply route 2 (two) times daily. E11.9     Calcium  Acetate 667 MG TABS Take 1 tablet by mouth daily.     Continuous Blood Gluc Receiver (DEXCOM G7 RECEIVER) DEVI Used to monitor blood sugar. Replace once per year.     Continuous Blood Gluc Sensor (DEXCOM G7 SENSOR) MISC Use to monitor blood sugar. Replace sensor every 10 days.     epoetin  alfa-epbx (RETACRIT ) 40000 UNIT/ML injection Inject 40,000 Units into the skin every 28 (twenty-eight) days.     glucose blood (ONETOUCH VERIO) test strip USE AS DIRECTED TWICE DAILY 200 strip 3   hydrALAZINE  (APRESOLINE ) 50 MG tablet Take 1 tablet (50 mg total) by mouth every 8 (eight) hours. (Patient taking differently: Take 50 mg by mouth 2 (two) times daily.) 90 tablet 0   insulin  degludec (TRESIBA  FLEXTOUCH) 100 UNIT/ML FlexTouch Pen Inject 8 Units into the skin daily. 15 mL 2   insulin  lispro (HUMALOG ) 100 UNIT/ML KwikPen 3 to 4 units before each meal 15 mL    isosorbide  mononitrate (IMDUR ) 30 MG 24 hr tablet Take 1 tablet (30 mg total) by mouth daily. 30 tablet 0   JARDIANCE  10 MG TABS tablet Take 10 mg by mouth daily.  lidocaine  (LIDODERM ) 5 % Place 1 patch onto the skin every 12 (twelve) hours.     lidocaine -prilocaine (EMLA) cream Apply 1 Application topically See admin instructions. Three times a week     magnesium  oxide (MAG-OX) 400 MG tablet Take 400 mg by mouth daily.     metoprolol  succinate (TOPROL -XL) 100 MG 24 hr tablet Take 100 mg by mouth  daily.     mycophenolate (CELLCEPT) 250 MG capsule Take 250 mg by mouth 2 (two) times daily. Take 4 capsules twice daily     NIFEdipine (PROCARDIA XL/NIFEDICAL XL) 60 MG 24 hr tablet Take 1 tablet by mouth 2 (two) times daily.     OneTouch Delica Lancets 33G MISC 1 each by Does not apply route 2 (two) times daily. E11.9     oxyCODONE  (OXY IR/ROXICODONE ) 5 MG immediate release tablet Take 1 tablet by mouth every 6 (six) hours as needed.     oxyCODONE -acetaminophen  (PERCOCET) 5-325 MG tablet Take 1 tablet by mouth every 6 (six) hours as needed for severe pain. 8 tablet 0   pantoprazole  (PROTONIX ) 40 MG tablet Take 1 tablet (40 mg total) by mouth daily. 90 tablet 3   polyethylene glycol (MIRALAX  / GLYCOLAX ) 17 g packet Take 34-51 g by mouth daily as needed for mild constipation.     predniSONE  (DELTASONE ) 5 MG tablet Take 3 tablets by mouth daily.     senna (SENOKOT) 8.6 MG tablet Take 2 tablets by mouth 2 (two) times daily as needed.     sulfamethoxazole -trimethoprim  (BACTRIM ) 400-80 MG tablet Take 1 tablet by mouth daily.     tacrolimus  (PROGRAF ) 1 MG capsule Take 6 capsules by mouth.     valGANciclovir (VALCYTE) 450 MG tablet Take 1 tablet by mouth every 12 (twelve) hours.     Vitamin D , Ergocalciferol , (DRISDOL) 1.25 MG (50000 UNIT) CAPS capsule Take 50,000 Units by mouth once a week. For 8 days     No current facility-administered medications for this visit.    PHYSICAL EXAM: There were no vitals filed for this visit. There is no height or weight on file to calculate BMI.  Wt Readings from Last 3 Encounters:  07/15/22 115 lb (52.2 kg)  06/15/22 115 lb (52.2 kg)  05/11/22 112 lb 4.8 oz (50.9 kg)    General: Well developed, well nourished female in no apparent distress.  HEENT: AT/[place], no external lesions.  Eyes: Conjunctiva clear and no icterus. Neck: Neck supple  Lungs: Respirations not labored Neurologic: Alert, oriented, normal speech Extremities / Skin: Dry. No sores or rashes  noted. No acanthosis nigricans Psychiatric: Does not appear depressed or anxious  Diabetic Foot Exam - Simple   No data filed    LABS Reviewed Lab Results  Component Value Date   HGBA1C 7.0 (A) 09/09/2022   HGBA1C 7.4 (A) 05/02/2022   HGBA1C 4.9 01/28/2022   Lab Results  Component Value Date   FRUCTOSAMINE 363 (H) 04/27/2022   FRUCTOSAMINE 315 (H) 01/28/2022   FRUCTOSAMINE 294 (H) 09/29/2021   Lab Results  Component Value Date   CHOL 155 07/11/2018   HDL 44.70 07/11/2018   LDLCALC 80 07/11/2018   LDLDIRECT 69.0 09/29/2021   TRIG 151.0 (H) 07/11/2018   CHOLHDL 3 07/11/2018   Lab Results  Component Value Date   MICRALBCREAT 10.9 08/28/2015   MICRALBCREAT 679.6 (H) 04/09/2008   Lab Results  Component Value Date   CREATININE 0.94 02/20/2023   Lab Results  Component Value Date   GFR 25.74 (L) 07/11/2018  ASSESSMENT / PLAN  1. Uncontrolled type 2 diabetes mellitus with hyperglycemia, with long-term current use of insulin  (HCC)     Diabetes Mellitus type 2, complicated by diabetic nephropathy, ESRD s/p renal transplant - Diabetic status / severity: controlled.   Lab Results  Component Value Date   HGBA1C 7.0 (A) 09/09/2022    - Hemoglobin A1c goal : <7%  Patient reports he had hemoglobin A1c of 7.2% yesterday with PCP visit at [facility].  GMI on CGM 7.4%.  She is having hyperglycemia at bedtime and evening mainly due to snack and soft drinks.  - Medications: No change.  I) continue Tresiba  8 units daily in the morning. II) continue Humalog  3 -5  units with meals 3 times a day.  If you eat bedtime snack taking locked units.  Discussed about avoiding sugary snacks and soft drinks and stopping bedtime snacks.  - Home glucose testing: Dexcom G7 and check as needed. - Discussed/ Gave Hypoglycemia treatment plan.  # Consult : not required at this time.   # She is status post renal transplant, following with nephrology and transplant team.   Last   Lab Results  Component Value Date   MICRALBCREAT 10.9 08/28/2015    # Foot check nightly.  # She has diabetic retinopathy, following with ophthalmology.  No detail records available to review.  - Diet: Eat reasonable portion sizes to promote a healthy weight - Life style / activity / exercise: Discussed.  2. Blood pressure  -  BP Readings from Last 1 Encounters:  07/15/22 134/72    - Control is in target.  - No change in current plans.  3. Lipid status / Hyperlipidemia - Last  Lab Results  Component Value Date   LDLCALC 80 07/11/2018   - Continue atorvastatin  40 mg daily.  Managed by primary care provider.  Crescent was seen today for follow-up.  Diagnoses and all orders for this visit:  Uncontrolled type 2 diabetes mellitus with hyperglycemia, with long-term current use of insulin  (HCC)    DISPOSITION Follow up in clinic in 3  months suggested.   All questions answered and patient verbalized understanding of the plan.  Maleak Brazzel, MD [facility] Endocrinology [facility] [address] [place], [address] [place], [place] [address] Phone # [phone]  At least part of this note was generated using voice recognition software. Inadvertent word errors may have occurred, which were not recognized during the proofreading process.

## 2023-02-28 NOTE — Patient Instructions (Addendum)
  Tresiba 8 units in the morning. Humalog 3 - 5 units with meals three times a day. If you eat at bedtime take humalog 2 units.   Please avoid donuts or sugary snacks and bedtime snack.

## 2023-03-02 NOTE — Progress Notes (Signed)
 KIDNEY AND PANCREAS TRANSPLANT PROGRAM  POST- TRANSPLANT MEDICAL FOLLOW UP VISIT     [name] is being seen in the transplant clinic today for evaluation and management of :   - Allograft function   - Hypertension   - Chronic Immunosuppression      PCP : Ronal MARLA Corona, PA-C   Endocrinologist : Iraq Thapa MD   Community Nephrologist : Bernardino Gasman MD     Transplant Specific History      Pre-Transplant   - ESKD due to presumed diabetic and hypertensive nephropathy  - deceased donor kidney transplant on 02/07/2022 (Kidney)   - cPRA 48 %,       Peri-Transplant   - Donor : DCD , KDPI 58 %     - Induction - Thymo ( 3 mg/kg)   - CMV moderate risk   - EBV moderate risk    Post Transplant   - 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     Interim History Since Last Visit       Time since transplant : 1 year 17 days     [name] does not report symptoms referable to her transplanted kidney.  She does report compliance with her immunosuppressive medications.    She does not report drug related side effects.   No reports of fever.  No reports of nausea, vomiting or diarrhea. No pain abdomen.   No reports of shortness of breath, chest pain.    PFSH recorded in the EMR was reviewed with the patient - there are no changes or additions since the last update.    Other Pertinent History      The past medical history, surgical history, family history, problem list, allergy list, and medication list were reviewed and updated as appropriate.    PMH:    Cerebral artery oocclusion   HFpEF ( EF 70 % )   Diabetes   HLD   HTN     PSH :   DDKT    Current Medication List      Current Outpatient Medications   Medication Sig    atorvaSTATin  (LIPITOR) 40 MG tablet Take 1 tablet by mouth nightly.    DEXCOM G7 SENSOR MISC Use to monitor blood sugar. Replace sensor every 10 days.    empagliflozin  (JARDIANCE ) 10 MG TABS Take 1 tablet by mouth daily.    Insulin  Degludec (TRESIBA  FLEXTOUCH) 100 UNIT/ML SOPN Inject 8 Units into the skin nightly.    Insulin  Lispro, HumaLOG  KWIKPEN 1 Unit Dial , (HUMALOG  KWIKPEN) 100 UNIT/ML SOPN Inject under the skin 3 times daily before meals as per sliding scale:  If blood sugar is Less than 70: Treat hypoglycemia   70-140 inject 0 units  141-180 inject 1 unit  181-220 inject 3 units  221-260 inject 4 units  261-300 inject 5 units  301-340 inject 7 units  higher than 340 inject 8 units and contact LIP.  (Discard pen 28 days after first use)    Insulin  Pen Needle (BD PEN NEEDLE NANO U/F) 32G X 4 MM MISC Use to inject insulin  4 times daily as directed.    magnesium  oxide (MAG-OX) 400 MG tablet Take 1 tablet by mouth 2 times daily.    metoprolol  succinate ER (TOPROL -XL) 100 MG 24 hr tablet TAKE 1 TABLET BY MOUTH EVERY DAY    mycophenolate  (CELLCEPT ) 250 MG capsule Take 1 capsule by mouth 2 times daily (Transplant meds).  Do not  chew    NIFEdipine  60 MG 24 hr tablet Take 1 tablet by mouth 2 times daily.    pantoprazole  (PROTONIX ) 40 MG delayed release tablet Take 1 tablet by mouth every morning (before breakfast).    predniSONE  (DELTASONE ) 5 MG tablet Take 1 tablet by mouth daily.    spironolactone  (ALDACTONE ) 50 MG tablet Take 1 tablet by mouth 2 times daily.    tacrolimus  (PROGRAF ) 1 MG capsule Take 3 capsules by mouth every morning AND 3 capsules every evening. Do all this for 360 days.     No current facility-administered medications for this visit.     Review of Systems      A 14 pt ROS was conducted, pertinent positive listed in HPI, rest negative.     Physical Examination     Reviewing her vitals,  weight is 54.1 kg (119 lb 3.2 oz). Her temporal temperature is 36.6 ?C (97.9 ?F). Her blood pressure is 128/78 and her pulse is 74. Her respiration is 18 and oxygen saturation is 100%.      Body mass index is 21.8 kg/m?SABRA    GENERAL : Appears well, not in distress, appropriately conversant.  HEENT : EOMI , mucous membranes moist  RESPIRATORY : NVBS heard bilaterally, no adventitious breath sounds heard.  CARDIOVASCULAR : S1 S2 + , no added sounds heard.  ABDOMEN : Soft, non-tender, graft site appears well healed.    EXTREMITIES : No edema  INTEGUMENT : No rashes on limited skin exam     Lab Data      Lab Results   Component Value Date    CREATININE 1.1 03/02/2023    CREATININE 0.94 02/20/2023    CREATININE 1.13 (H) 01/10/2023     Lab Results   Component Value Date    BUN 19 03/02/2023     Lab Results   Component Value Date    NA 137 03/02/2023    K 4.3 03/02/2023    CL 104 03/02/2023    CO2 24 03/02/2023      Lab Results   Component Value Date    WBC 5.61 03/02/2023    HGB 14.6 03/02/2023    PLT 209 03/02/2023     Lab Results   Component Value Date    ALKPHOS 109 03/02/2023    ALT 35 03/02/2023    AST 18 03/02/2023    BILITOT 0.5 03/02/2023     Lab Results   Component Value Date    GLU 180 (H) 03/02/2023    HGBA1C 7.0 03/02/2023     Lab Results   Component Value Date    PTH 159.2 (H) 03/02/2023    CALCIUM  9.7 03/02/2023    PHOS 3.5 03/02/2023     Lab Results   Component Value Date    VITD 34 03/02/2023     Lab Results   Component Value Date    UCREA 138.3 11/14/2022    PROTEINUR 120.0 11/14/2022    UPCRER 0.09 11/14/2022      Lab Results   Component Value Date    FK506 5.8 12/12/2022       Assessment and Plan    [name] is a 73 y.o. female with known End Stage Kidney Diseae due to Diabetic Nephropathy  or Presumed Hypertensive Nephropathy  s/p deceased donor donor kidney transplant on 02/07/2022 (Kidney). she was seen in the transplant clinic on 03/02/2023 :     # KIDNEY ALLOGRAFT FUNCTION  Cr on most recent test was  excellent ~ 1.0   Electrolytes and acid base parameters are at goal.   Proteinuria estimates will be reviewed when available.     # IMMUNOSUPPRESSION    Induction :   Anti-Thymocyte Globulin 3 mg/kg    Maintenance :  Tacrolimus  / Mycophenolate  / Prednisone  based regimen    Belatacept : not discussed, doing well on Tac for now.   Tacrolimus  : 3/3  mg , will follow levels. 4-7 mg/dl    Mycophenolate  : 250/250 bid ( in setting of BK viremia )    Prednisone   : 5 mg daily   We will continue close monitoring of these medications due to narrow margin of toxicity.     # PROPHYLAXIS  Bacterial and viral completed.     # DIABETES   Followed locally in [place], [place] by Dr Iraq Thapa.   Good glycemic control.   Currently using a Dexcom monitor as well.   On SGLT2i   On Lipitor 40 mg     # BK VIREMIA   Resolved - not detected on most recent blood work   Will check again today      # HYPERTENSION  BP at home is well controlled on current regimen   Will continue the current regimen    Nifedipine  60 bid    Toprol  XL 100 mg    Spironolactone  50 mg bid   BP trends high normal in setting of recent chantix withdrawal - will follow     # HEMATOLOGY  Hb / WBC / Platelets reviewed, no change to current therapy.     # HEALTHCARE MAINTENANCE  Deferred to PCP, Latisha Ronal POUR, PA-C  COLONOSCOPY:  06/16/20 - hyperplastic polyp, poor prep   PAP:  03/12/21 @ [facility] - negative   MAMMO:  reportedly done recently. 01/2023 ; BiRAD 1   Sun block and sun protection re-enforced.  Dermatology referral to be placed.   Discuss with PCP RE: immunizations. Pneumonia / Shingles / RSV      # SHOULDER PAIN   Resolved.        Immunization History   Administered Date(s) Administered    COVID-19 (PFIZER BIONTECH) VACCINE 03/21/2019, 04/18/2019, 01/05/2020    COVID-19 (PFIZER BIONTECH) VACCINE - GRAY, NO DILUTE 03/21/2019, 04/18/2019, 01/05/2020    Covid-19 (Unspecified) Vaccine 03/21/2019, 04/18/2019, 01/05/2020    Flu Vaccine Whole (With Preservative) =>3yo Im 10/25/2011    Hepatitis B Heplisav 11/12/2020, 12/10/2020, 01/14/2021, 03/11/2021    INFLUENZA (FLUAD) VACCINE (CH) 12/16/2019    INFLUENZA (FLUZONE HD) VACCINE (65 YEARS AND OLDER) 12/16/2019    Influenza (FluLaval) vaccine (6 months and older) 12/13/2012, 10/07/2014    Influenza, unspecified formulation 01/11/2016, 12/05/2016, 12/15/2017, 11/03/2020 Pneumo 13 (ex: Prevnar 13) 08/28/2015    Pneumo 23 (ex: Pneumovax) 12/13/2011, 07/07/2017    Pneumococcal, unspecified formulation 12/13/2011, 08/28/2015, 07/07/2017    Td adult 09/09/2008    Tdap adolescent/adult (ex: Boostrix) 12/16/2019    Zostavax 12/13/2012    influenza (FLUBLOK) vaccine (83 years of age and older or Solid organ transplant recipients 69 - 49 years) 11/02/2021          Labs frequency Q 3 month until 01/2022   Follow up : Q6 months     Elie Frohlich MD   Assistant Professor of Medicine  Transplant Nephrologist  [employer]   [email or web address]

## 2023-04-06 ENCOUNTER — Encounter (INDEPENDENT_AMBULATORY_CARE_PROVIDER_SITE_OTHER): Payer: Medicare Other | Admitting: Ophthalmology

## 2023-04-06 DIAGNOSIS — H35033 Hypertensive retinopathy, bilateral: Secondary | ICD-10-CM | POA: Diagnosis not present

## 2023-04-06 DIAGNOSIS — E113591 Type 2 diabetes mellitus with proliferative diabetic retinopathy without macular edema, right eye: Secondary | ICD-10-CM

## 2023-04-06 DIAGNOSIS — H2511 Age-related nuclear cataract, right eye: Secondary | ICD-10-CM

## 2023-04-06 DIAGNOSIS — E113512 Type 2 diabetes mellitus with proliferative diabetic retinopathy with macular edema, left eye: Secondary | ICD-10-CM

## 2023-04-06 DIAGNOSIS — H43813 Vitreous degeneration, bilateral: Secondary | ICD-10-CM

## 2023-04-06 DIAGNOSIS — I10 Essential (primary) hypertension: Secondary | ICD-10-CM | POA: Diagnosis not present

## 2023-04-06 DIAGNOSIS — H35342 Macular cyst, hole, or pseudohole, left eye: Secondary | ICD-10-CM

## 2023-05-03 ENCOUNTER — Encounter (INDEPENDENT_AMBULATORY_CARE_PROVIDER_SITE_OTHER): Admitting: Ophthalmology

## 2023-05-03 ENCOUNTER — Ambulatory Visit: Payer: Medicare Other | Admitting: Podiatry

## 2023-05-03 DIAGNOSIS — E113591 Type 2 diabetes mellitus with proliferative diabetic retinopathy without macular edema, right eye: Secondary | ICD-10-CM | POA: Diagnosis not present

## 2023-05-03 DIAGNOSIS — E113512 Type 2 diabetes mellitus with proliferative diabetic retinopathy with macular edema, left eye: Secondary | ICD-10-CM | POA: Diagnosis not present

## 2023-05-03 DIAGNOSIS — H35342 Macular cyst, hole, or pseudohole, left eye: Secondary | ICD-10-CM

## 2023-05-03 DIAGNOSIS — Z724 Inappropriate diet and eating habits: Secondary | ICD-10-CM | POA: Diagnosis not present

## 2023-05-03 DIAGNOSIS — H35033 Hypertensive retinopathy, bilateral: Secondary | ICD-10-CM

## 2023-05-03 DIAGNOSIS — I10 Essential (primary) hypertension: Secondary | ICD-10-CM

## 2023-05-03 DIAGNOSIS — H43813 Vitreous degeneration, bilateral: Secondary | ICD-10-CM

## 2023-05-24 ENCOUNTER — Ambulatory Visit (INDEPENDENT_AMBULATORY_CARE_PROVIDER_SITE_OTHER): Payer: Medicare Other | Admitting: Podiatry

## 2023-05-24 ENCOUNTER — Encounter: Payer: Self-pay | Admitting: Podiatry

## 2023-05-24 DIAGNOSIS — M79675 Pain in left toe(s): Secondary | ICD-10-CM

## 2023-05-24 DIAGNOSIS — E1151 Type 2 diabetes mellitus with diabetic peripheral angiopathy without gangrene: Secondary | ICD-10-CM | POA: Diagnosis not present

## 2023-05-24 DIAGNOSIS — B351 Tinea unguium: Secondary | ICD-10-CM

## 2023-05-24 DIAGNOSIS — M79674 Pain in right toe(s): Secondary | ICD-10-CM | POA: Diagnosis not present

## 2023-05-29 ENCOUNTER — Other Ambulatory Visit (HOSPITAL_COMMUNITY)
Admission: RE | Admit: 2023-05-29 | Discharge: 2023-05-29 | Disposition: A | Discharge status: Home / Self Care | Source: Ambulatory Visit

## 2023-05-29 DIAGNOSIS — Z940 Kidney transplant status: Secondary | ICD-10-CM | POA: Insufficient documentation

## 2023-05-29 DIAGNOSIS — Z79899 Other long term (current) drug therapy: Secondary | ICD-10-CM | POA: Insufficient documentation

## 2023-05-29 LAB — CBC WITH DIFFERENTIAL/PLATELET
Abs Immature Granulocytes: 0.03 10*3/uL (ref 0.00–0.07)
Basophils Absolute: 0 10*3/uL (ref 0.0–0.1)
Basophils Relative: 1 %
Eosinophils Absolute: 0.1 10*3/uL (ref 0.0–0.5)
Eosinophils Relative: 2 %
HCT: 45.1 % (ref 36.0–46.0)
Hemoglobin: 14.6 g/dL (ref 12.0–15.0)
Immature Granulocytes: 1 %
Lymphocytes Relative: 21 %
Lymphs Abs: 1 10*3/uL (ref 0.7–4.0)
MCH: 31.3 pg (ref 26.0–34.0)
MCHC: 32.4 g/dL (ref 30.0–36.0)
MCV: 96.6 fL (ref 80.0–100.0)
Monocytes Absolute: 0.6 10*3/uL (ref 0.1–1.0)
Monocytes Relative: 13 %
Neutro Abs: 3.2 10*3/uL (ref 1.7–7.7)
Neutrophils Relative %: 62 %
Platelets: 211 10*3/uL (ref 150–400)
RBC: 4.67 MIL/uL (ref 3.87–5.11)
RDW: 13.1 % (ref 11.5–15.5)
WBC: 5 10*3/uL (ref 4.0–10.5)
nRBC: 0 % (ref 0.0–0.2)

## 2023-05-29 LAB — COMPREHENSIVE METABOLIC PANEL WITH GFR
ALT: 33 U/L (ref 0–44)
AST: 30 U/L (ref 15–41)
Albumin: 3.8 g/dL (ref 3.5–5.0)
Alkaline Phosphatase: 81 U/L (ref 38–126)
Anion gap: 8 (ref 5–15)
BUN: 21 mg/dL (ref 8–23)
CO2: 26 mmol/L (ref 22–32)
Calcium: 9.6 mg/dL (ref 8.9–10.3)
Chloride: 104 mmol/L (ref 98–111)
Creatinine, Ser: 0.9 mg/dL (ref 0.44–1.00)
GFR, Estimated: >60 mL/min (ref >60)
Glucose, Bld: 133 mg/dL — ABNORMAL HIGH (ref 70–99)
Potassium: 4.1 mmol/L (ref 3.5–5.1)
Sodium: 138 mmol/L (ref 135–145)
Total Bilirubin: 1.1 mg/dL (ref 0.0–1.2)
Total Protein: 6.9 g/dL (ref 6.5–8.1)

## 2023-05-29 LAB — PHOSPHORUS: Phosphorus: 3.5 mg/dL (ref 2.5–4.6)

## 2023-05-30 ENCOUNTER — Encounter: Payer: Self-pay | Admitting: Endocrinology

## 2023-05-30 ENCOUNTER — Ambulatory Visit (INDEPENDENT_AMBULATORY_CARE_PROVIDER_SITE_OTHER): Payer: Medicare Other | Admitting: Endocrinology

## 2023-05-30 VITALS — BP 132/70 | HR 62 | Resp 20 | Ht 62.0 in | Wt 117.4 lb

## 2023-05-30 DIAGNOSIS — E1165 Type 2 diabetes mellitus with hyperglycemia: Secondary | ICD-10-CM | POA: Diagnosis not present

## 2023-05-30 DIAGNOSIS — Z794 Long term (current) use of insulin: Secondary | ICD-10-CM

## 2023-05-30 LAB — POCT GLYCOSYLATED HEMOGLOBIN (HGB A1C): Hemoglobin A1C: 6.9 % — AB (ref 4.0–5.6)

## 2023-05-30 NOTE — Patient Instructions (Signed)
  Tresiba  8 units in the morning. Humalog  3 - 5 units with meals three times a day. If you eat at bedtime take humalog  2 units. For larg meal take up to 5 units of humalog .   Please avoid donuts or sugary snacks and bedtime snack.

## 2023-05-30 NOTE — Progress Notes (Signed)
 Outpatient Endocrinology Note [place] Jericca Russett, MD  05/30/23  Patient's Name: [name]    DOB: 07-04-1950    MRN: [id number]                                                    REASON OF VISIT: Follow up for type 2 diabetes mellitus  PCP: Sheryl Donna, PA-C  HISTORY OF PRESENT ILLNESS:   [name] is a 73 y.o. old female with past medical history listed below, is here for follow up of type 2 diabetes mellitus.   Pertinent Diabetes History: Patient was diagnosed with type 2 diabetes mellitus in 2007.  Patient had transplant in January 2024 in [place] .  Patient used to be on oral antidiabetic medication in the past.  She is currently on basal bolus insulin  regimen.  Chronic Diabetes Complications : Retinopathy: yes, no records available to review.  Last ophthalmology exam was done on annually, reportedly. 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 Peripheral neuropathy: no Coronary artery disease: no Stroke: no  Relevant comorbidities and cardiovascular risk factors: Obesity: no Body mass index is 21.47 kg/m.  Hypertension: yes Hyperlipidemia. yes  Current / Home Diabetic regimen includes: Tresiba  8 units in the morning. Humalog  3 units with meals 3 times a day.  Prior diabetic medications: Metformin , repaglinide , Actos.  Glycemic data:    CONTINUOUS GLUCOSE MONITORING SYSTEM (CGMS) INTERPRETATION: At today's visit, we reviewed CGM downloads. The full report is scanned in the media. Reviewing the CGM trends, blood glucose are as follows:  Dexcom G7 CGM-  Sensor Download (Sensor download was reviewed and summarized below.) Dates: April 23 to May 30, 2023 , 14 days  Glucose Management Indicator: 7.2%    Interpretation: Random hyperglycemia postprandially with blood sugar up to 200 range especially with lunch and occasionally at bedtime snacks and dinner.  For acceptable blood sugar overnight  and early morning.  Blood sugar in between the meals acceptable.  No hypoglycemia.  Hypoglycemia: Patient has no hypoglycemic episodes. Patient has hypoglycemia awareness.   Factors modifying glucose control: 1.  Diabetic diet assessment: Eating 3 meals a day and sometimes eats Diet Coke and donut.  Bedtime snacks.  2.  Staying active or exercising: No formal exercise.  3.  Medication compliance: compliant all of the time.  Interval history CGM data as reviewed above.  He still with postprandial occasional hyperglycemia at bedtime and especially with lunch and supper.  Hemoglobin A1c improved to 6.9%.  Diabetes regimen reviewed and noted above.  No other complaints today.  REVIEW OF SYSTEMS As per history of present illness.   PAST MEDICAL HISTORY: Past Medical History:  Diagnosis Date   Arthritis    Chronic back pain    Chronic kidney disease (CKD)    Constipation    Cough    Diabetes mellitus, type 2 (HCC)    Diverticulosis    Fibroid    patient thinks this was the reason for her hysterectomy   GERD (gastroesophageal reflux disease)    Heart murmur    History of sebaceous cyst    Hyperlipemia    Hyperplastic colon polyp    Hypertension    Hyperthyroidism    Lumbar radiculopathy    Shortness of breath 09/13/2013   Stroke (HCC) 2015?   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 12/2018    PAST SURGICAL HISTORY: Past Surgical History:  Procedure Laterality Date   ABDOMINAL HYSTERECTOMY     --?ovaries remain   AV FISTULA PLACEMENT Left 07/16/2020   Procedure: LEFT BRACHIOCEPHALIC ARTERIOVENOUS (AV) FISTULA CREATION;  Surgeon: Carlene Che, MD;  Location: [facility] OR;  Service: Vascular;  Laterality: Left;  PERIPHERAL NERVE BLOCK   COLONOSCOPY W/ BIOPSIES  09/11/2008   ESOPHAGOGASTRODUODENOSCOPY (EGD) WITH PROPOFOL  N/A 05/20/2020   Procedure: ESOPHAGOGASTRODUODENOSCOPY (EGD) WITH PROPOFOL ;  Surgeon: Tobin Forts, MD;  Location:  [facility] ENDOSCOPY;  Service: Endoscopy;  Laterality: N/A;   GIVENS CAPSULE STUDY N/A 06/28/2020   Procedure: GIVENS CAPSULE STUDY;  Surgeon: Tobin Forts, MD;  Location: [facility] ENDOSCOPY;  Service: Endoscopy;  Laterality: N/A;   RETINOPATHY SURGERY Bilateral 2013   Dr. Augustus Ledger    ALLERGIES: Allergies  Allergen Reactions   Tape Other (See Comments)    Paper tape rips skin.    Semaglutide  Rash    Rybelsus  3 mg    FAMILY HISTORY:  Family History  Problem Relation Age of Onset   Hypertension Mother    Sleep apnea Mother    Diabetes Mother    Hyperlipidemia Mother    Lung cancer Father        smoker   Hypertension Sister    Diabetes Sister    Hypertension Sister    Hypertension Brother    Diabetes Brother    Hypertension Brother    Breast cancer Neg Hx    Colon cancer Neg Hx    Esophageal cancer Neg Hx    Pancreatic cancer Neg Hx    Liver disease Neg Hx    Colon polyps Neg Hx    BRCA 1/2 Neg Hx     SOCIAL HISTORY: Social History   Socioeconomic History   Marital status: Divorced    Spouse name: Not on file   Number of children: 1   Years of education: 11   Highest education level: Not on file  Occupational History   Occupation: Engineer, drilling: [employer]    Comment: Retired  Tobacco Use   Smoking status: Some Days    Current packs/day: 0.00    Average packs/day: 0.5 packs/day for 35.0 years (17.5 ttl pk-yrs)    Types: Cigarettes    Start date: 07/07/1979    Last attempt to quit: 07/07/2014    Years since quitting: 8.9   Smokeless tobacco: Never   Tobacco comments:    Smoking some again    02/12/2022 patient smokes 1 -2 some days  Vaping Use   Vaping status: Never Used  Substance and Sexual Activity   Alcohol use: No   Drug use: No   Sexual activity: Never    Partners: Male    Birth control/protection: Surgical    Comment: TAH--?ovaries remain  Other Topics Concern   Not on file  Social History Narrative   Divorced 1 son    Retired  Administrator, sports for [employer]   Former smoker no alcohol caffeine or drug use report denies abuse and feels safe at home.   Social Drivers of Health   Financial Resource Strain: Medium Risk (08/23/2019)   Overall Financial Resource Strain (CARDIA)    Difficulty of Paying Living Expenses: Somewhat hard  Food Insecurity: Low Risk  (06/08/2022)   [facility]   Hunger Vital Sign  Worried About Programme researcher, broadcasting/film/video in the Last Year: Never true    Ran Out of Food in the Last Year: Never true  Transportation Needs: Not on file (06/08/2022)  Physical Activity: Sufficiently Active (08/23/2019)   Exercise Vital Sign    Days of Exercise per Week: 5 days    Minutes of Exercise per Session: 30 min  Stress: No Stress Concern Present (08/23/2019)   [employer] of Occupational Health - Occupational Stress Questionnaire    Feeling of Stress : Not at all  Social Connections: Moderately Integrated (07/07/2017)   Social Connection and Isolation Panel [NHANES]    Frequency of Communication with Friends and Family: More than three times a week    Frequency of Social Gatherings with Friends and Family: More than three times a week    Attends Religious Services: More than 4 times per year    Active Member of Clubs or Organizations: Yes    Attends Engineer, structural: More than 4 times per year    Marital Status: Divorced    MEDICATIONS:  Current Outpatient Medications  Medication Sig Dispense Refill   atorvastatin  (LIPITOR ) 40 MG tablet Take 1 tablet by mouth at bedtime.     blood glucose meter kit and supplies KIT Dispense based on patient and insurance preference. Use up to four times daily as directed. 1 each 0   Blood Glucose Monitoring Suppl (ONETOUCH VERIO FLEX SYSTEM) w/Device KIT 1 each by Does not apply route 2 (two) times daily. E11.9     Continuous Blood Gluc Receiver (DEXCOM G7 RECEIVER) DEVI Used to monitor blood sugar. Replace once per  year.     Continuous Blood Gluc Sensor (DEXCOM G7 SENSOR) MISC Use to monitor blood sugar. Replace sensor every 10 days.     insulin  degludec (TRESIBA  FLEXTOUCH) 100 UNIT/ML FlexTouch Pen Inject 8 Units into the skin daily. 15 mL 2   insulin  lispro (HUMALOG ) 100 UNIT/ML KwikPen 3 to 4 units before each meal 15 mL    JARDIANCE 10 MG TABS tablet Take 10 mg by mouth daily.     mycophenolate (CELLCEPT) 250 MG capsule Take 250 mg by mouth 2 (two) times daily. Take 4 capsules twice daily     NIFEdipine (PROCARDIA XL/NIFEDICAL XL) 60 MG 24 hr tablet Take 1 tablet by mouth 2 (two) times daily.     pantoprazole  (PROTONIX ) 40 MG tablet Take 1 tablet (40 mg total) by mouth daily. 90 tablet 3   predniSONE  (DELTASONE ) 5 MG tablet Take 3 tablets by mouth daily.     valGANciclovir (VALCYTE) 450 MG tablet Take 1 tablet by mouth every 12 (twelve) hours.     acetaminophen  (TYLENOL ) 325 MG tablet Take 2 tablets by mouth every 6 (six) hours as needed. (Patient not taking: Reported on 05/30/2023)     acetaminophen  (TYLENOL ) 500 MG tablet Take 1,000 mg by mouth every 6 (six) hours as needed for moderate pain (pain score 4-6). (Patient not taking: Reported on 05/30/2023)     amLODipine  (NORVASC ) 10 MG tablet TAKE 1 TABLET BY MOUTH EVERY DAY (Patient not taking: Reported on 05/30/2023) 90 tablet 0   B Complex-C-Zn-Folic Acid  (DIALYVITE 800-ZINC 15) 0.8 MG TABS Take 1 tablet by mouth daily. (Patient not taking: Reported on 05/30/2023)     Calcium  Acetate 667 MG TABS Take 1 tablet by mouth daily. (Patient not taking: Reported on 05/30/2023)     epoetin  alfa-epbx (RETACRIT ) 40000 UNIT/ML injection Inject 40,000 Units into the skin every 28 (twenty-eight)  days.     glucose blood (ONETOUCH VERIO) test strip USE AS DIRECTED TWICE DAILY 200 strip 3   isosorbide  mononitrate (IMDUR ) 30 MG 24 hr tablet Take 1 tablet (30 mg total) by mouth daily. (Patient not taking: Reported on 05/30/2023) 30 tablet 0   lidocaine  (LIDODERM ) 5 % Place 1 patch  onto the skin every 12 (twelve) hours. (Patient not taking: Reported on 05/30/2023)     lidocaine -prilocaine (EMLA) cream Apply 1 Application topically See admin instructions. Three times a week (Patient not taking: Reported on 05/30/2023)     magnesium  oxide (MAG-OX) 400 MG tablet Take 400 mg by mouth daily. (Patient not taking: Reported on 05/30/2023)     metoprolol  succinate (TOPROL -XL) 100 MG 24 hr tablet Take 100 mg by mouth daily. (Patient not taking: Reported on 05/30/2023)     OneTouch Delica Lancets 33G MISC 1 each by Does not apply route 2 (two) times daily. E11.9 (Patient not taking: Reported on 05/30/2023)     oxyCODONE  (OXY IR/ROXICODONE ) 5 MG immediate release tablet Take 1 tablet by mouth every 6 (six) hours as needed. (Patient not taking: Reported on 05/30/2023)     oxyCODONE -acetaminophen  (PERCOCET) 5-325 MG tablet Take 1 tablet by mouth every 6 (six) hours as needed for severe pain. (Patient not taking: Reported on 05/30/2023) 8 tablet 0   polyethylene glycol (MIRALAX  / GLYCOLAX ) 17 g packet Take 34-51 g by mouth daily as needed for mild constipation. (Patient not taking: Reported on 05/30/2023)     senna (SENOKOT) 8.6 MG tablet Take 2 tablets by mouth 2 (two) times daily as needed. (Patient not taking: Reported on 05/30/2023)     sulfamethoxazole -trimethoprim  (BACTRIM ) 400-80 MG tablet Take 1 tablet by mouth daily. (Patient not taking: Reported on 05/30/2023)     tacrolimus  (PROGRAF ) 1 MG capsule Take 6 capsules by mouth. (Patient not taking: Reported on 05/30/2023)     Vitamin D , Ergocalciferol , (DRISDOL) 1.25 MG (50000 UNIT) CAPS capsule Take 50,000 Units by mouth once a week. For 8 days (Patient not taking: Reported on 05/30/2023)     No current facility-administered medications for this visit.    PHYSICAL EXAM: Vitals:   05/30/23 1020  BP: 132/70  Pulse: 62  Resp: 20  SpO2: 97%  Weight: 117 lb 6.4 oz (53.3 kg)  Height: 5\' 2"  (1.575 m)   Body mass index is 21.47 kg/m.  Wt Readings from Last  3 Encounters:  05/30/23 117 lb 6.4 oz (53.3 kg)  07/15/22 115 lb (52.2 kg)  06/15/22 115 lb (52.2 kg)    General: Well developed, well nourished female in no apparent distress.  HEENT: AT/[place], no external lesions.  Eyes: Conjunctiva clear and no icterus. Neck: Neck supple  Lungs: Respirations not labored Neurologic: Alert, oriented, normal speech Extremities / Skin: Dry. Psychiatric: Does not appear depressed or anxious  Diabetic Foot Exam - Simple   No data filed    LABS Reviewed Lab Results  Component Value Date   HGBA1C 6.9 (A) 05/30/2023   HGBA1C 7.0 (A) 09/09/2022   HGBA1C 7.4 (A) 05/02/2022   Lab Results  Component Value Date   FRUCTOSAMINE 363 (H) 04/27/2022   FRUCTOSAMINE 315 (H) 01/28/2022   FRUCTOSAMINE 294 (H) 09/29/2021   Lab Results  Component Value Date   CHOL 155 07/11/2018   HDL 44.70 07/11/2018   LDLCALC 80 07/11/2018   LDLDIRECT 69.0 09/29/2021   TRIG 151.0 (H) 07/11/2018   CHOLHDL 3 07/11/2018   Lab Results  Component Value Date  MICRALBCREAT 10.9 08/28/2015   MICRALBCREAT 679.6 (H) 04/09/2008   Lab Results  Component Value Date   CREATININE 0.90 05/29/2023   Lab Results  Component Value Date   GFR 25.74 (L) 07/11/2018    ASSESSMENT / PLAN  1. Uncontrolled type 2 diabetes mellitus with hyperglycemia, with long-term current use of insulin  (HCC)     Diabetes Mellitus type 2, complicated by diabetic nephropathy, ESRD s/p renal transplant - Diabetic status / severity: controlled.   Lab Results  Component Value Date   HGBA1C 6.9 (A) 05/30/2023    - Hemoglobin A1c goal : <6.5%  Diabetes control improving.  He still with postprandial hyperglycemia occasionally, discussed about limiting carbohydrates and portion control.  Discussed about avoiding bedtime snack.  - Medications: See below.  I) continue Tresiba  8 units daily in the morning. II) continue Humalog  3 -5  units with meals 3 times a day.  Advised to take Humalog  up to 5  units for large meals.  Discussed about avoiding bedtime snacks however if she takes bedtime snack asked to take Humalog  2 units.   - Home glucose testing: Dexcom G7 and check as needed. - Discussed/ Gave Hypoglycemia treatment plan.  # Consult : not required at this time.   # She is status post renal transplant, following with nephrology and transplant team.   Last  Lab Results  Component Value Date   MICRALBCREAT 10.9 08/28/2015    # Foot check nightly.  # She has diabetic retinopathy, following with ophthalmology.  No detail records available to review.  - Diet: Eat reasonable portion sizes to promote a healthy weight - Life style / activity / exercise: Discussed.  2. Blood pressure  -  BP Readings from Last 1 Encounters:  05/30/23 132/70    - Control is in target.  - No change in current plans.  3. Lipid status / Hyperlipidemia - Last  Lab Results  Component Value Date   LDLCALC 80 07/11/2018   - Continue atorvastatin  40 mg daily.  Managed by primary care provider.  Diagnoses and all orders for this visit:  Uncontrolled type 2 diabetes mellitus with hyperglycemia, with long-term current use of insulin  (HCC) -     POCT glycosylated hemoglobin (Hb A1C)     DISPOSITION Follow up in clinic in 3  months suggested.   All questions answered and patient verbalized understanding of the plan.  [place] Marciano Mundt, MD [facility] Endocrinology [facility] [address] [place], [address] [place], [place] [address] Phone # [phone]  At least part of this note was generated using voice recognition software. Inadvertent word errors may have occurred, which were not recognized during the proofreading process.

## 2023-05-31 LAB — TACROLIMUS LEVEL: Tacrolimus (FK506) - LabCorp: 4.9 ng/mL — ABNORMAL LOW (ref 5.0–20.0)

## 2023-05-31 NOTE — Progress Notes (Signed)
  Subjective:  Patient ID: [name], female    DOB: 1950-12-20,  MRN: [id number]  [name] presents to clinic today for at risk foot care. Pt has h/o NIDDM with PAD and painful, elongated thickened toenails x 10 which are symptomatic when wearing enclosed shoe gear. This interferes with [name] daily activities.  Chief Complaint  Patient presents with   Diabetes    "Toenails"  Dr. [place] Thapa - 02/26/2023; A1c - 7   New problem(s): None.   PCP is Sheryl Donna, PA-C.  Allergies  Allergen Reactions   Tape Other (See Comments)    Paper tape rips skin.    Semaglutide  Rash    Rybelsus  3 mg    Review of Systems: Negative except as noted in the HPI.  Objective: No changes noted in today's physical examination. There were no vitals filed for this visit. [name] is a pleasant 73 y.o. female WD, WN in NAD. AAO x 3.  Vascular Examination: CFT <3 seconds b/l. DP pulses faintly palpable b/l. PT pulses nonpalpable b/l. Digital hair absent. Skin temperature gradient warm to warm b/l. No pain with calf compression. No ischemia or gangrene. No cyanosis or clubbing noted b/l.    Neurological Examination: Sensation grossly intact b/l with 10 gram monofilament. Vibratory sensation intact b/l.   Dermatological Examination: Pedal skin warm and supple b/l. No open wounds b/l. No interdigital macerations. Toenails 1-5 b/l thick, discolored, elongated with subungual debris and pain on dorsal palpation.  No corns, calluses nor porokeratotic lesions noted.  Musculoskeletal Examination: Muscle strength 5/5 to all lower extremity muscle groups bilaterally. No pain, crepitus or joint limitation noted with ROM bilateral LE. No gross bony deformities bilaterally.  Radiographs: None  Last HgA1c:      Latest Ref Rng & Units 05/30/2023   10:25 AM 09/09/2022   10:34 AM  Hemoglobin A1C  Hemoglobin-A1c 4.0 - 5.6 % 6.9  7.0    Assessment/Plan: 1. Pain due to onychomycosis  of toenails of both feet   2. Type II diabetes mellitus with peripheral circulatory disorder [facility])    Consent given for treatment. Patient examined. All patient's and/or POA's questions/concerns addressed on today's visit. Toenails 1-5 debrided in length and girth without incident. Continue foot and shoe inspections daily. Monitor blood glucose per PCP/Endocrinologist's recommendations. Continue soft, supportive shoe gear daily. Report any pedal injuries to medical professional. Call office if there are any questions/concerns. -Patient/POA to call should there be question/concern in the interim.   Return in about 3 months (around 08/23/2023).  [name], DPM      [name] LOCATION: [address], [place] [address]                   Office [phone]   [facility] LOCATION: [address] [place], [place] [address] Office [phone]

## 2023-06-01 ENCOUNTER — Encounter (INDEPENDENT_AMBULATORY_CARE_PROVIDER_SITE_OTHER): Admitting: Ophthalmology

## 2023-06-06 ENCOUNTER — Encounter (HOSPITAL_COMMUNITY): Admission: RE | Payer: Self-pay | Source: Home / Self Care

## 2023-06-06 ENCOUNTER — Ambulatory Visit (HOSPITAL_COMMUNITY): Admission: RE | Admit: 2023-06-06 | Source: Home / Self Care | Admitting: Ophthalmology

## 2023-06-06 SURGERY — PARS PLANA VITRECTOMY  FOR MACULAR HOLE WITH LASER AND INTRAOCULAR TAMPONADE
Anesthesia: General | Laterality: Left

## 2023-06-29 ENCOUNTER — Other Ambulatory Visit: Payer: Self-pay

## 2023-06-29 NOTE — Patient Outreach (Signed)
 Aging Gracefully Program  06/29/2023  [name] 05/25/1950 [id number]   [facility] Evaluation Interviewer made contact with patient. Aging Gracefully survey completed.   Interviewer will send referral to RN and OT for follow up.    [facility]  Population Health Care Management Assistant  Direct Dial : [phone]  Fax: [phone] Website: [email or web address]

## 2023-07-05 ENCOUNTER — Telehealth: Payer: Self-pay | Admitting: Occupational Therapy

## 2023-07-05 NOTE — Telephone Encounter (Signed)
 Aging Gracefully  Spoke with pt to schedule initial AG OT visit. Scheduled for 07/07/23 at 400pm.    Lafonda Piety, OTR/L  [phone] 07/05/23

## 2023-07-07 ENCOUNTER — Other Ambulatory Visit: Admitting: Occupational Therapy

## 2023-07-08 NOTE — Patient Outreach (Signed)
 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 What Types Of Care Problems Are You Having Throughout The Day?: Difficulty getting in/out of the shower. Has additional home repair concerns What Kind Of Help Do You Receive?: None Do You Think You Need Other Types Of Help?: No What Do You Think Would Make Everyday Life Easier For You?: Feeling safe getting into/out of shower What Is A Good Day Like?: No pain, getting outside and going where prefer. What Is A Bad Day Like?: Not feeling well, having difficulty getting motivated Do You Have Time For Yourself?: Yes Patient Reported Equipment: Patient Reported Equipment Currently Used: Other (comment) (none) Functional Mobility-Walking Indoors/Getting Around the House: Walking Indoors/Getting Around The House: No Difficulty Do You:: No Device/No Assistance Importance Of Learning New Strategies:: Not At All Intervention: No Functional Mobility-Walk A Block: Walk A Block: No Difficulty Do You:: No Device/No Assistance Importance Of Learning New Strategies:: Not At All Intervention: No Functional Mobility-Maintain Balance While Showering: Maintaining Balance While Showering: A Little Difficulty Do You:: Use A Device Importance Of Learning New Strategies:: Moderate Other Comments:: Uses temporary grab bars but they fall down Observation: Maintain Balance While Showering: N/O Intervention: Yes Other Comments:: Install grab bars Functional Mobility-Stooping, Crouching, Kneeling To Retreive Item: Stooping, Crouching, or Kneeling To Retrieve Item: No Difficulty Do You:: No Device/No Assistance Importance Of Learning New Strategies:: Not At All Intervention:  No Functional Mobility-Bending From Standing Position To Pick Up Clothing Off The Floor: Bending Over From Standing Position To Pick Up Clothing Off The Floor: No Difficulty Do You:: No Device/No Assistance Importance Of Learning New Strategies:: Not At All Intervention: No Functional Mobility-Reaching For Items Above Shoulder Level: Reaching For Items Above Shoulder Level: No Difficulty Do You:: No Device/No Assistance Importance Of Learning New Strategies:: Not At All Intervention: No Functional Mobility-Climb 1 Flight Of Stairs: Climb 1 Flight Of Stairs: No Difficulty Do You:: No Device/No Assistance Importance Of Learning New Strategies:: Not At All Intervention: No Functional Mobility-Move In And Out Of Chair: Move In and Out Of A Chair: No Difficulty Do You:: No Device/No Assistance Importance Of Learning New Strategies:: Not At All Intervention: No Functional Mobility-Move In And Out Of Bed: Move In and Out Of Bed: No Difficulty Do You:: No Device/No Assistance Importance Of Learning New Strategies:: Not At All Intervention: No Functional Mobility-Move In And Out Of Bath/Shower: Move In And Out Of A Bath/Shower: A Lot Of Difficulty Do You:: No Device/No Assistance Importance Of Learning New Strategies:: Very Much Other Comments:: Needs grab bars or walk-in shower, pt unsure of preference Observation: Move In And Out Of Bath/Shower: Independent With Pain, Difficulty, Or Use Of Device Safety: Moderate/Extreme Risk Efficiency: Somewhat Intervention: Yes Functional Mobility-Get On And Off Toilet: Getting Up From The Floor: Moderate Difficulty Do You:: Use A Device Importance Of Learning New Strategies:: Not At All Intervention: No Functional Mobility-Into And Out Of Car, Not Including Driving: Into  And Out Of Car, Not Including Driving: No Difficulty Do You:: No Device/No Assistance Importance Of Learning New Strategies:: Not At All Intervention: No Activities of  Daily Living-Bathing/Showering: ADL-Bathing/Showering: No Difficulty Do You:: No Device/No Assistance Importance Of Learning New Strategies: Not At All Intervention: No  Activities of Daily Living-Personal Hygiene and Grooming: Personal Hygiene and Grooming: No Difficulty Do You:: No Device/No Assistance Importance Of Learning New Strategies: Not At All Intervention: No Activities of Daily Living-Toilet Hygiene: Toilet Hygiene: No Difficulty Do You:: No Device/No Assistance Importance Of Learning New Strategies: Not At All Intervention: No Activities of Daily Living-Put On And Take Off Undergarments (Incl. Fasteners): Put On And Take Off Undergarments (Incl. Fasteners): No Difficulty Do You:: No Device/No Assistance Importance Of Learning New Strategies: Not At All Intervention: No Activities of Daily Living-Put On And Take Off Shirt/Dress/Coat (Incl. Fasteners): Put On And Take Off Shirt/Dress/Coat (Incl. Fasteners): No Difficulty Do You:: No Device/No Assistance Importance Of Learning New Strategies: Not At All Intervention: No Activities of Daily Living-Put On And Take Off Socks And Shoes: Put On And Take Off Socks And  Shoes: No Difficulty Do You:: No Device/No Assistance Importance Of Learning New Strategies: Not At All Intervention: No Activities of Daily Living-Feed Self: Feed Self: No Difficulty Do You:: No Device/No Assistance Importance Of Learning New Strategies: Not At All Intervention: No Activities of Daily Living-Rest And Sleep: Rest and Sleep: No Difficulty Do You:: No Device/No Assistance Importance Of Learning New Strategies: Not At All Intervention: No Activities of Daily Living-Sexual Activity: Sexual  Activity: N/A Instrumental Activities of Daily Living-Light Homemaking (Laundry, Straightening Up, Vacuuming):  Do Light Homemaking (Laundry, Straightening Up, Vacuuming): No Difficulty Do You:: No Device/No Assistance Importance Of Learning New  Strategies: Not At All Intervention: No Instrumental Activities of Daily Living-Making A Bed: Making a Bed: No Difficulty Do You:: No Device/No Assistance Importance Of Learning New Strategies: Not At All Intervention: No Instrumental Activities of Daily Living-Washing Dishes By Hand While Standing At The Sink: Washing Dishes By Hand While Standing At The Sink: No Difficulty Do You:: No Device/No Assistance Importance Of Learning New Strategies: Not At All Other Comments:: Needs leak fixed Intervention: No Instrumental Activities of Daily Living-Grocery Shopping: Do Grocery Shopping: No Difficulty Do You:: No Device/No Assistance Importance Of Learning New Strategies: Not At All Intervention: No Instrumental Activities of Daily Living-Use Telephone: Use Telephone: No Difficulty Do You:: No Device/No Assistance Importance Of Learning New Strategies: Not At All Intervention: No Instrumental Activities of Daily Living-Financial Management: Financial Management: No Difficulty Do You:: Use A Device Importance Of Learning New Strategies: Not At All Intervention: No Instrumental Activities of Daily Living-Medications: Take Medications: No Difficulty Do You:: No Device/No Assistance Importance Of Learning New Strategies: Not At All Intervention: No Instrumental Activities of Daily Living-Health Management And Maintenance: Health Management & Maintenance: No Difficulty Do You:: No Device/No Assistance Importance Of Learning New Strategies: Not At All Intervention: No Instrumental Activities of Daily Living-Meal Preparation and Clean-Up: Meal Preparation and Clean-Up: No Difficulty Do You:: No Device/No Assistance Importance Of Learning New Strategies: Not At All Intervention: No Instrumental Activities of Daily Living-Provide Care For Others/Pets: Care For Others/Pets: N/A Instrumental Activities of Daily Living-Take Part In Organized Social Activities: Take Part In Organized  Social Activities: No Difficulty Do You:: No Device/No Assistance Importance Of Learning New Strategies: Not At All Intervention: No Instrumental Activities of Daily Living-Leisure Participation: Leisure Participation: No Difficulty Do You:: No Device/No Assistance Importance Of Learning New Strategies: Not At All Intervention: No Instrumental Activities of Daily Living-Employment/Volunteer Activities: Employment/Volunteer Activities: N/A Instrumental Activities of Daily Living-Other Identifies:    Readiness To Change Score:  Readiness to Change Score: 8.33  Home Environment Assessment: Outside Home Entry:: Steps with secure rails Entryway/Foyer:: enter into living room  Dining Room:: clean, no fall risks Living Room:: clean, no fall risks or trip hazards Kitchen:: clear; sink leaking and light over sink not working WPS Resources:: two steps down into laundry area, needs a Sport and exercise psychologist:: Hall bath-has tub shower, question need for grab bars versus walk-in shower. High threshhold where flooring was laid over previous floor; master bath-flooring needs replacement=trip hazard. Question commode replacement. Master Bedroom:: bed, dresser, clear walkway Laundry:: 2 steps into laundry room-needs railing Basement:: None Hallways:: clear-no concerns Other Home Environment Concerns:: Pt requests [facility] look at spots on ceiling, roof, outdoor vents, sink leak in Energy manager:    Patient Education: Education Provided: Yes Education Details: Discussed AG program components and POC Person(s) Educated: Patient Comprehension: Verbalized Understanding  Goals:  Goals Addressed             This Visit's Progress    Patient Stated       Pt will improve mobility in the home by getting into and out of laundry room safely and independently.      Patient Stated       Pt will increase safety during bathing by getting into and out of the shower independently using grab bars and  DME as needed.      Patient Stated       Pt will improve mobility in getting on /off the commode independently using grab bars/AE for safety.         Post Clinical Reasoning: Clinician View Of Client Situation:: Pt with great mobility, good insight into health and benefits of preparing for aging. Clean home environment, good areas of concern.  Client View Of His/Her Situation:: Motivated to keep up her mobility. Good awareness of current needs and preventative measures. Feels she is doing well for her age.  Next Visit Plan:: Follow up on decision of grab bars in tub or walk-in shower, decide if tub grippers needed   Lafonda Piety, OTR/L  [phone]

## 2023-07-18 ENCOUNTER — Other Ambulatory Visit: Payer: Self-pay | Admitting: *Deleted

## 2023-07-18 NOTE — Patient Outreach (Signed)
 Aging Gracefully Program  07/18/2023  [name] 12-Feb-1950 [id number]   Telephone call made to [name] to schedule 1st Aging Gracefully RN home visit.   Aging Gracefully RN home visit schedule for 07/25/23 at 10 am.  Marnee confirmed address and provided contact information.   Pablo Hurst, MSN, RN, BSN [place]  [facility], Healthy Communities RN Case Manager for Aging Gracefully Direct Dial : [phone]

## 2023-07-19 DIAGNOSIS — E1165 Type 2 diabetes mellitus with hyperglycemia: Secondary | ICD-10-CM

## 2023-07-21 ENCOUNTER — Ambulatory Visit: Admitting: Internal Medicine

## 2023-07-24 ENCOUNTER — Other Ambulatory Visit: Payer: Self-pay | Admitting: *Deleted

## 2023-07-24 NOTE — Patient Outreach (Signed)
 Aging Gracefully Program  07/24/2023  LAHOMA CONSTANTIN [birth year] [id number]   Telephone call made to confirm AG RN home visit for tomorrow July 1st at 10 am. Ms. Duren requests to reschedule due to nail appointment.   Rescheduled AG RN home visit for Wednesday, July 2nd at 10 am.   Pablo Hurst, MSN, RN, BSN [place]  [facility], Healthy Communities RN Case Manager for Aging Gracefully Direct Dial : [phone]

## 2023-07-25 ENCOUNTER — Encounter: Admitting: *Deleted

## 2023-07-26 ENCOUNTER — Other Ambulatory Visit: Payer: Self-pay | Admitting: *Deleted

## 2023-07-26 ENCOUNTER — Encounter: Payer: Self-pay | Admitting: *Deleted

## 2023-07-26 NOTE — Patient Outreach (Signed)
 Aging Gracefully Program  RN Visit  2023-08-14  ICY FUHRMANN [birth year] [id number]  Visit:  RN Visit Number: 1- Initial Visit  RN TIME CALCULATION: Start TIme:  RN Start Time Calculation: 1000 End Time:  RN Stop Time Calculation: 1140 Total Minutes:  RN Time Calculation: 100  Readiness To Change Score:  Readiness to Change Score: 9.33  Universal RN Interventions: Calendar Distribution: Yes Exercise Review: No Medications: Yes Medication Changes: No Mood: Yes Pain: Yes PCP Advocacy/Support: No Fall Prevention: Yes Incontinence: Yes Clinician View Of Client Situation: Arrived for home visit. Ms. Bordeaux answered door. Living room area clean, neat, and without clutter. House smelled of smoke. Ambulates independently with assitive device. Client View Of His/Her Situation: Ms. Fleisher doing well overall. Looking forward to home modifications. States she has had multiple falls attempting to get out of her tub/shower. Denies injury. 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  Healthcare Provider Communication: Did Surveyor, mining With [employer] Provider?: No Healthcare Provider Response According to RN: n/a According to Client, Did PCP Report Communication With An Aging Gracefully RN?: No Healthcare Provider Response According To Client: n/a  Clinician View of Client Situation: Clinician View Of Client Situation: Arrived for home visit. Ms. Rogstad answered door. Living room area clean, neat, and without clutter. House smelled of smoke. Ambulates independently with assitive device. Client's View of His/Her Situation: Client View Of His/Her Situation: Ms. Klinkner doing well overall. Looking forward to home modifications. States she has had multiple falls attempting to get out of her tub/shower. Denies injury. 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  Medication Assessment: Reviewed    OT Update: Pending [facility] contracts and  assessments  Session Summary: Pleasant visit with Ms. Biondo. She is doing well overall.    Goals Addressed               This Visit's Progress     AG RN (pt-stated)        14-Aug-2023  Assessment: Ms. Heumann denies pain. Reports she has had multiple falls when getting out of the shower/tub in the bathroom. States she looks forward to [facility] home modifications. Ms. Hiott reports being independent and still drives. States she has a cane but does not use it. Endorses smoking 1.5 packs of cigarettes a day. States she wants to quit. Reports being s/p kidney transplant in January 2024. Goes to [facility] hospital [facility] for follow up.   Interventions: Provided chronic disease management booklet, writer's contact information, calendar book, and DM booklet. Discussed fall precautions. Encouraged Ms. Smola to use cane more and to at least have it nearby.  Encouraged Ms. Tabone to carry cell phone on her at all times since she does not have a medical alert system. Discussed strategies for smoking cessation.   Plan: scheduled next home visit for August 13th at 10 am.   CLIENT/RN ACTION PLAN - STOP SMOKING  Registered Nurse:    Date:  Client Name:  Client ID:    Target Area:  STOP SMOKING    What does client want to be able to do? To stop smoking    Years smoking: 20 + years   Packs per day:  1.5   STRATEGIES Ideas Strategies  Why Quit? Reviewed 14-Aug-2023 Save money Feel better You and your family will be more healthy. Set a good example for your children and grandchildren.   Risks to You Reviewed August 14, 2023 Death Cancer:  lung, colon, bladder, mouth, stomach and pancreas Arthritis  Asthma, couch and Chronic Obstructive Pulmonary Disease Heart Disease including high blood pressure  Risks to Your Family Reviewed 07/26/23 Cancer Asthma, allergies and pneumonia Heart Disease Ear Infections   Be Patient Yourself Reviewed 07/26/23 Many people try to quit several times before they are  successful. If you slip and do smoke, don't get mad at yourself, try to stop again the next day.    How to Quit Smoking Reviewed 07/26/23 Many people who quit use a lot of different tools at the same time: Support group Medications Nicotine  patches or Nicotine  gum Support from family and friends    Set a Quit Date Reviewed 07/26/23 A quit date gives you a goal to reach. It is a promise to yourself and your lived ones that you will quit. If you decide today that you want to quit, stet your quit date 2-3 weeks from now.     Medications Reviewed 07/26/23 Medications can decrease Nicotine  cravings. Zyban, Wellbutrin, or Chantix are medicines people take to help quit smoking.  You need a prescription from your Primary Care Provider.   Nicotine  Cravings Reviewed 07/26/23 Wait for craving to go away - be patient. Chew gum. Wear Nicotine  patches. Take Medication.   Know your triggers. Reviewed 07/26/23 Common triggers At a party Boredom Stress Drinking alcohol Hanging out with friends who smoke   Dealing with Triggers  Reviewed 07/26/23 Distract yourself, go for a walk or exercise. Avoid friends whom smoke. Pray Listen to music. Take a bath. Wait for the urge to pass. Take slow deep breaths until the urge for cigarette goes away.   Get Support Reviewed 07/26/23 Support groups Family and friends Smoking quit line, [phone] [facility]) CDC help quit smoking [phone] [place] offers QuitSmart smoking cessation classes call 3856664571 to register   More information Reviewed 07/26/23 "Pathways to Freedom" by the CDC  MysteryRaffle.it quit/pathways/ "Clearing the Air" by the [employer], NIH http://www.smokefree.gove/pubs/Clearing_the_Air_508.pdf http://www.smokefree/gov/   Other    PRACTICE It is important to practice the strategies so we can determine if they will be effective in helping to reach the goal.     Follow these specific recommendations:        If strategy does not work the first time, try it again.     We may make some changes over the next few sessions.       Pablo Hurst, MSN, RN, BSN [place]  [facility], Healthy Communities RN Case Manager for Aging Gracefully Direct Dial : [phone]

## 2023-07-26 NOTE — Patient Instructions (Signed)
 Visit Information  Thank you for taking time to visit with me today. Please don't hesitate to contact me if I can be of assistance to you before our next scheduled home appointment.  Following are the goals we discussed today:  (Copy and paste patient goals from clinical care plan here)  Our next appointment is on August 13 at 10 am.  If you are experiencing a Mental Health or Behavioral Health Crisis or need someone to talk to, please call the Suicide and Crisis Lifeline: 988 call the [place]  National Suicide Prevention Lifeline: 3212986030 or TTY: 709 004 2074 TTY 435-475-3418) to talk to a trained counselor call 1-800-273-TALK (toll free, 24 hour hotline) call the [facility]: [phone] call 911   The patient verbalized understanding of instructions, educational materials, and care plan provided today and agreed to receive a mailed copy of patient instructions, educational materials, and care plan.   Pablo Hurst, MSN, RN, BSN [place]  [facility], Healthy Communities RN Case Manager for Aging Gracefully Direct Dial : [phone]

## 2023-08-03 ENCOUNTER — Telehealth: Payer: Self-pay

## 2023-08-03 ENCOUNTER — Other Ambulatory Visit: Payer: Self-pay

## 2023-08-03 MED ORDER — INSULIN PEN NEEDLE 32G X 4 MM MISC
3 refills | Status: AC
Start: 2023-08-03 — End: ?

## 2023-08-03 NOTE — Telephone Encounter (Signed)
 Nano Pen request complete

## 2023-08-03 NOTE — Telephone Encounter (Signed)
 Returned patient call regarding VM left needing a refill for pen needles. Patient called to confirm if there was a certain type/brand needed. No answer. Sent patient Mychart message.

## 2023-08-17 ENCOUNTER — Telehealth: Payer: Self-pay | Admitting: Occupational Therapy

## 2023-08-17 NOTE — Telephone Encounter (Signed)
 Aging Gracefully: called to check in with [name]. She reports [facility] has been out and they reviewed her work order, is waiting to sign paperwork. Explained since her mobility is good and she did not have any DME needs OT will not return until work begins so we can address pt's goals with modifications completed. Client verbalized understanding. Will continue to check in and complete visit #2 once a modification is completed.   Sonny Cory, OTR/L  [phone] 08/17/23

## 2023-08-20 ENCOUNTER — Other Ambulatory Visit: Payer: Self-pay | Admitting: Endocrinology

## 2023-08-20 DIAGNOSIS — E1165 Type 2 diabetes mellitus with hyperglycemia: Secondary | ICD-10-CM

## 2023-08-21 NOTE — Telephone Encounter (Signed)
 Refill request complete

## 2023-08-28 ENCOUNTER — Other Ambulatory Visit (HOSPITAL_COMMUNITY)
Admission: AD | Admit: 2023-08-28 | Discharge: 2023-08-28 | Disposition: A | Discharge status: Home / Self Care | Source: Ambulatory Visit | Attending: *Deleted | Admitting: *Deleted

## 2023-08-28 DIAGNOSIS — Z79899 Other long term (current) drug therapy: Secondary | ICD-10-CM | POA: Insufficient documentation

## 2023-08-28 DIAGNOSIS — Z794 Long term (current) use of insulin: Secondary | ICD-10-CM | POA: Insufficient documentation

## 2023-08-28 LAB — CBC WITH DIFFERENTIAL/PLATELET
Abs Immature Granulocytes: 0.07 K/uL (ref 0.00–0.07)
Basophils Absolute: 0.1 K/uL (ref 0.0–0.1)
Basophils Relative: 1 %
Eosinophils Absolute: 0.1 K/uL (ref 0.0–0.5)
Eosinophils Relative: 2 %
HCT: 42 % (ref 36.0–46.0)
Hemoglobin: 13.8 g/dL (ref 12.0–15.0)
Immature Granulocytes: 1 %
Lymphocytes Relative: 10 %
Lymphs Abs: 0.8 K/uL (ref 0.7–4.0)
MCH: 32.2 pg (ref 26.0–34.0)
MCHC: 32.9 g/dL (ref 30.0–36.0)
MCV: 97.9 fL (ref 80.0–100.0)
Monocytes Absolute: 0.8 K/uL (ref 0.1–1.0)
Monocytes Relative: 9 %
Neutro Abs: 6.8 K/uL (ref 1.7–7.7)
Neutrophils Relative %: 77 %
Platelets: 309 K/uL (ref 150–400)
RBC: 4.29 MIL/uL (ref 3.87–5.11)
RDW: 12.8 % (ref 11.5–15.5)
WBC: 8.7 K/uL (ref 4.0–10.5)
nRBC: 0 % (ref 0.0–0.2)

## 2023-08-28 LAB — COMPREHENSIVE METABOLIC PANEL WITH GFR
ALT: 18 U/L (ref 0–44)
AST: 21 U/L (ref 15–41)
Albumin: 3.7 g/dL (ref 3.5–5.0)
Alkaline Phosphatase: 82 U/L (ref 38–126)
Anion gap: 12 (ref 5–15)
BUN: 15 mg/dL (ref 8–23)
CO2: 23 mmol/L (ref 22–32)
Calcium: 9.8 mg/dL (ref 8.9–10.3)
Chloride: 102 mmol/L (ref 98–111)
Creatinine, Ser: 0.92 mg/dL (ref 0.44–1.00)
GFR, Estimated: >60 mL/min (ref >60)
Glucose, Bld: 152 mg/dL — ABNORMAL HIGH (ref 70–99)
Potassium: 3.8 mmol/L (ref 3.5–5.1)
Sodium: 137 mmol/L (ref 135–145)
Total Bilirubin: 0.9 mg/dL (ref 0.0–1.2)
Total Protein: 7.4 g/dL (ref 6.5–8.1)

## 2023-08-28 LAB — PHOSPHORUS: Phosphorus: 3.2 mg/dL (ref 2.5–4.6)

## 2023-08-29 LAB — BK QUANT PCR (PLASMA/SERUM): BK Quantitaion PCR: POSITIVE [IU]/mL

## 2023-08-30 LAB — TACROLIMUS LEVEL: Tacrolimus (FK506) - LabCorp: 4.5 ng/mL — ABNORMAL LOW (ref 5.0–20.0)

## 2023-08-31 ENCOUNTER — Telehealth: Payer: Self-pay

## 2023-08-31 DIAGNOSIS — E1165 Type 2 diabetes mellitus with hyperglycemia: Secondary | ICD-10-CM

## 2023-08-31 MED ORDER — DEXCOM G7 SENSOR MISC: 6 refills | Status: AC

## 2023-08-31 NOTE — Telephone Encounter (Signed)
 Refill request complete

## 2023-09-05 ENCOUNTER — Encounter: Payer: Self-pay | Admitting: Podiatry

## 2023-09-05 ENCOUNTER — Ambulatory Visit: Admitting: Podiatry

## 2023-09-05 DIAGNOSIS — M79675 Pain in left toe(s): Secondary | ICD-10-CM

## 2023-09-05 DIAGNOSIS — B351 Tinea unguium: Secondary | ICD-10-CM | POA: Diagnosis not present

## 2023-09-05 DIAGNOSIS — M79674 Pain in right toe(s): Secondary | ICD-10-CM

## 2023-09-05 DIAGNOSIS — E1151 Type 2 diabetes mellitus with diabetic peripheral angiopathy without gangrene: Secondary | ICD-10-CM

## 2023-09-06 ENCOUNTER — Other Ambulatory Visit: Payer: Self-pay | Admitting: *Deleted

## 2023-09-06 ENCOUNTER — Encounter: Payer: Self-pay | Admitting: *Deleted

## 2023-09-06 NOTE — Patient Outreach (Signed)
 Aging Gracefully Program  RN Visit  09/06/2023  DESHANA ROMINGER 19-Apr-1950 [id number]  Visit:  RN Visit Number: 2- Second Visit  RN TIME CALCULATION: Start TIme:  RN Start Time Calculation: 1100 End Time:  RN Stop Time Calculation: 1200 Total Minutes:  RN Time Calculation: 60  Readiness To Change Score:  Readiness to Change Score: 10  Universal RN Interventions: Calendar Distribution: No Exercise Review: Yes Medications: Yes Medication Changes: No Mood: Yes Pain: Yes PCP Advocacy/Support: No Fall Prevention: Yes Incontinence: Yes Clinician View Of Client Situation: Arrived for home visit. Ms. Irani answered the door in good spirits and warm smile. Front room area clean and neat without clutter or area rugs. Home smells of cigarette smoke however. Ms. Galka ambulating independently without assisitive device. Client View Of His/Her Situation: Ms. Barbian denies any recent falls. Rates knee pain 3-4. Expresses being happy about receiving a good report from the transplant doctor in [place]. States her next follow up is in 1 year.  Healthcare Provider Communication: Did Surveyor, mining With [employer] Provider?: No Healthcare Provider Response According to RN: n/a According to Client, Did PCP Report Communication With An Aging Gracefully RN?: No Healthcare Provider Response According To Client: n/a  Clinician View of Client Situation: Clinician View Of Client Situation: Arrived for home visit. Ms. Panozzo answered the door in good spirits and warm smile. Front room area clean and neat without clutter or area rugs. Home smells of cigarette smoke however. Ms. Pfannenstiel ambulating independently without assisitive device. Client's View of His/Her Situation: Client View Of His/Her Situation: Ms. Moyano denies any recent falls. Rates knee pain 3-4. Expresses being happy about receiving a good report from the transplant doctor in [place]. States her next follow up is in 1  year.  Medication Assessment: Reviewed.    OT Update: Pending [facility] contracts and assessments.  Session Summary: Ms. Kaylor is delightful and engaged with her healthcare and smoking cessation goals.   Goals Addressed               This Visit's Progress     AG RN (pt-stated)        07/26/23  Assessment: Ms. Flax denies pain. Reports she has had multiple falls when getting out of the shower/tub in the bathroom. States she looks forward to [facility] home modifications. Ms. Garabedian reports being independent and still drives. States she has a cane but does not use it. Endorses smoking 1.5 packs of cigarettes a day. States she wants to quit. Reports being s/p kidney transplant in January 2024. Goes to [facility] for follow up.   Interventions: Provided chronic disease management booklet, writer's contact information, calendar book, and DM booklet. Discussed fall precautions. Encouraged Ms. Brazie to use cane more and to at least have it nearby.  Encouraged Ms. Mokry to carry cell phone on her at all times since she does not have a medical alert system. Discussed strategies for smoking cessation. Discussed EMMI videos/articles for smoking cessation. Verified Ms. Efaw' email address.  Plan: Scheduled next home visit for August 13th at 10 am. Send EMMI smoking cessation videos/articles via email.   CLIENT/RN ACTION PLAN - STOP SMOKING  Registered Nurse:  Pablo Hurst  Date: 07/26/2023  [name]  Client ID:    Target Area:  STOP SMOKING    What does client want to be able to do? To stop smoking    Years smoking: 20 + years   Packs per day:  1.5  STRATEGIES Ideas Strategies  Why Quit? Reviewed Aug 07, 2023 Reviewed Sep 18, 2023 Save money Feel better You and your family will be more healthy. Set a good example for your children and grandchildren.   Risks to You Reviewed 2023/08/07 Reviewed September 18, 2023 Death Cancer:  lung, colon, bladder, mouth, stomach and  pancreas Arthritis Asthma, couch and Chronic Obstructive Pulmonary Disease Heart Disease including high blood pressure  Risks to Your Family Reviewed 2023/08/07 Reviewed Sep 18, 2023 Cancer Asthma, allergies and pneumonia Heart Disease Ear Infections   Be Patient Yourself Reviewed 2023-08-07 Reviewed September 18, 2023 Many people try to quit several times before they are successful. If you slip and do smoke, don't get mad at yourself, try to stop again the next day.    How to Quit Smoking Reviewed August 07, 2023 Reviewed 09-18-2023 Many people who quit use a lot of different tools at the same time: Support group Medications Nicotine  patches or Nicotine  gum Support from family and friends    Set a Quit Date Reviewed August 07, 2023 Reviewed 09/18/23 A quit date gives you a goal to reach. It is a promise to yourself and your lived ones that you will quit. If you decide today that you want to quit, stet your quit date 2-3 weeks from now.     Medications Reviewed 08-07-2023 Reviewed 2023/09/18 Medications can decrease Nicotine  cravings. Zyban, Wellbutrin, or Chantix are medicines people take to help quit smoking.  You need a prescription from your Primary Care Provider.   Nicotine  Cravings Reviewed Aug 07, 2023 Reviewed 09/18/2023 Wait for craving to go away - be patient. Chew gum. Wear Nicotine  patches. Take Medication.   Know your triggers. Reviewed Aug 07, 2023 Reviewed 2023/09/18 Common triggers At a party Boredom Stress Drinking alcohol Hanging out with friends who smoke   Dealing with Triggers  Reviewed 08/07/2023 Reviewed 09/18/23 Distract yourself, go for a walk or exercise. Avoid friends whom smoke. Pray Listen to music. Take a bath. Wait for the urge to pass. Take slow deep breaths until the urge for cigarette goes away.   Get Support Reviewed Aug 07, 2023 Reviewed September 18, 2023 Support groups Family and friends Smoking quit line, [phone] [facility]) CDC help quit smoking ([phone] [place]  offers QuitSmart smoking cessation classes call [phone] to register   More information Reviewed Aug 07, 2023 Reviewed 2023-09-18 "Pathways to Freedom" by the CDC  MysteryRaffle.it quit/pathways/ "Clearing the Air" by the [employer], NIH http://www.smokefree.gove/pubs/Clearing_the_Air_508.pdf http://www.smokefree/gov/   Other    PRACTICE It is important to practice the strategies so we can determine if they will be effective in helping to reach the goal.    Follow these specific recommendations:        If strategy does not work the first time, try it again.     We may make some changes over the next few sessions.       Pablo Hurst, MSN, RN, BSN [facility], Healthy Communities RN Case Manager for Aging Gracefully Direct Dial : [phone]      09/18/2023  Assessment: Ms. Mcneice reports she smokes 1/2 pack of cigarettes now. She was previously smoking 1.5 packs a day.  States she distracts herself with doing something else when she wants a cigarette. States she stopped taking the medication for smoking cessation because it caused nausea. Also states she has been without her atorvastatin .and spironolactone because she needs refills. States her transplant doctor told her to get her local physicians to refill the medications. Reports going to local food pantries regularly. Still drives. States she is not interested in Meals on [employer]  because she rather cook her own food. Denies any recent falls.  Interventions: Performed Aging Gracefully home exercises and provided booklet. Ms. Evelyn very engaged with returned demonstration. Encouraged Ms. Lenn to contact PCP to request refills on medications (she called PCP office during visit to request). Encouraged Ms. Reliford to schedule PCP visit as well as inform PCP about not finishing medication for smoking cessation. Praised Ms. Sperbeck for taking initiative in reaching  out to her doctors. Discussed fall precautions. Encouraged Ms. Huettner to have cane accessible at all times.  Plan: Scheduled next home visit for September 17th at 10 am.   Pablo Hurst, MSN, RN, BSN [place]  [facility], Healthy Communities RN Case Manager for Aging Gracefully Direct Dial : [phone]                                                   Pablo Hurst, MSN, RN, BSN [place]  [facility], Healthy Communities RN Case Manager for Aging Gracefully Direct Dial : [phone]

## 2023-09-06 NOTE — Patient Instructions (Signed)
 Visit Information  Thank you for taking time to visit with me today. Please don't hesitate to contact me if I can be of assistance to you before our next scheduled home appointment.  Following are the goals we discussed today:   Goals Addressed               This Visit's Progress     AG RN (pt-stated)        08-04-23  Assessment: [name] denies pain. Reports she has had multiple falls when getting out of the shower/tub in the bathroom. States she looks forward to [facility] home modifications. [name] reports being independent and still drives. States she has a cane but does not use it. Endorses smoking 1.5 packs of cigarettes a day. States she wants to quit. Reports being s/p kidney transplant in January 2024. Goes to [facility] for follow up.   Interventions: Provided chronic disease management booklet, writer's contact information, calendar book, and DM booklet. Discussed fall precautions. Encouraged [name] to use cane more and to at least have it nearby.  Encouraged [name] to carry cell phone on her at all times since she does not have a medical alert system. Discussed strategies for smoking cessation. Discussed EMMI videos/articles for smoking cessation. Verified [name]' email address.  Plan: Scheduled next home visit for Aug 22, 2025at 10 am. Send EMMI smoking cessation videos/articles via email.   CLIENT/RN ACTION PLAN - STOP SMOKING  Registered Nurse:  Pablo Hurst  Date: Aug 04, 2023  Client Name: [name]  Client ID:    Target Area:  STOP SMOKING    What does client want to be able to do? To stop smoking    Years smoking: 20 + years   Packs per day:  1.5   STRATEGIES Ideas Strategies  Why Quit? Reviewed 08-04-2023 Reviewed 09/15/2023 Save money Feel better You and your family will be more healthy. Set a good example for your children and grandchildren.   Risks to You Reviewed 08/04/2023 Reviewed 09/15/2023 Death Cancer:  lung, colon, bladder,  mouth, stomach and pancreas Arthritis Asthma, couch and Chronic Obstructive Pulmonary Disease Heart Disease including high blood pressure  Risks to Your Family Reviewed 2023/08/04 Reviewed 15-Sep-2023 Cancer Asthma, allergies and pneumonia Heart Disease Ear Infections   Be Patient Yourself Reviewed August 04, 2023 Reviewed September 15, 2023 Many people try to quit several times before they are successful. If you slip and do smoke, don't get mad at yourself, try to stop again the next day.    How to Quit Smoking Reviewed August 04, 2023 Reviewed 09-15-23 Many people who quit use a lot of different tools at the same time: Support group Medications Nicotine  patches or Nicotine  gum Support from family and friends    Set a Quit Date Reviewed 2023-08-04 Reviewed September 15, 2023 A quit date gives you a goal to reach. It is a promise to yourself and your lived ones that you will quit. If you decide today that you want to quit, stet your quit date 2-3 weeks from now.     Medications Reviewed Aug 04, 2023 Reviewed Sep 15, 2023 Medications can decrease Nicotine  cravings. Zyban, Wellbutrin, or Chantix are medicines people take to help quit smoking.  You need a prescription from your Primary Care Provider.   Nicotine  Cravings Reviewed August 04, 2023 Reviewed 09/15/23 Wait for craving to go away - be patient. Chew gum. Wear Nicotine  patches. Take Medication.   Know your triggers. Reviewed 2023/08/04 Reviewed 09/15/2023 Common triggers At a party Boredom Stress Drinking alcohol Hanging out with friends  who smoke   Dealing with Triggers  Reviewed 07/26/23 Reviewed 09/06/23 Distract yourself, go for a walk or exercise. Avoid friends whom smoke. Pray Listen to music. Take a bath. Wait for the urge to pass. Take slow deep breaths until the urge for cigarette goes away.   Get Support Reviewed 07/26/23 Reviewed 09/06/23 Support groups Family and friends Smoking quit line, [phone] [facility]) CDC help quit smoking  [phone] [place] offers QuitSmart smoking cessation classes call [phone] to register   More information Reviewed 07/26/23 Reviewed 09/06/23 "Pathways to Freedom" by the CDC  MysteryRaffle.it quit/pathways/ "Clearing the Air" by the [employer], NIH http://www.smokefree.gove/pubs/Clearing_the_Air_508.pdf http://www.smokefree/gov/   Other    PRACTICE It is important to practice the strategies so we can determine if they will be effective in helping to reach the goal.    Follow these specific recommendations:        If strategy does not work the first time, try it again.     We may make some changes over the next few sessions.       Pablo Hurst, MSN, RN, BSN [facility], Healthy Communities RN Case Manager for Aging Gracefully Direct Dial : [phone]      09/06/23  Assessment: [name] reports she smokes 1/2 pack of cigarettes now. She was previously smoking 1.5 packs a day.  States she distracts herself with doing something else when she wants a cigarette. States she stopped taking the medication for smoking cessation because it caused nausea. Also states she has been without her atorvastatin .and spironolactone because she needs refills. States her transplant doctor told her to get her local physicians to refill the medications. Reports going to local food pantries regularly. Still drives. States she is not interested in Meals on Wheels because she rather cook her own food. Denies any recent falls.  Interventions: Performed Aging Gracefully home exercises and provided booklet. [name] very engaged with returned demonstration. Encouraged [name] to contact PCP to request refills on medications (she called PCP office during visit to request). Encouraged [name] to schedule PCP visit as well as inform PCP about not finishing medication for smoking cessation. Praised [name] for  taking initiative in reaching out to her doctors. Discussed fall precautions. Encouraged [name] to have cane accessible at all times.  Plan: Scheduled next home visit for September 17th at 10 am.   Pablo Hurst, MSN, RN, BSN [place]  [facility], Healthy Communities RN Case Manager for Aging Gracefully Direct Dial : [phone]                                                     Our next appointment is on Sept 17 at 10 am.  If you are experiencing a Mental Health or Behavioral Health Crisis or need someone to talk to, please call the Suicide and Crisis Lifeline: 988 call the [place]  National Suicide Prevention Lifeline: (213) 170-3116 or TTY: 630-104-1565 TTY 234-526-7612) to talk to a trained counselor call 1-800-273-TALK (toll free, 24 hour hotline) go to [facility] [address] [place] [phone]) call 911   The patient verbalized understanding of instructions, educational materials, and care plan provided today and agreed to receive a mailed copy of patient instructions, educational materials, and care plan.   Pablo Hurst, MSN,  RN, BSN [place]  [facility], Healthy Communities RN Case Manager for Aging Gracefully Direct Dial : [phone]

## 2023-09-10 NOTE — Progress Notes (Signed)
  Subjective:  Patient ID: [name], female    DOB: [birth year],  MRN: [id number]  [name] presents to clinic today for at risk foot care. Pt has h/o NIDDM with PAD and painful thick toenails that are difficult to trim. Pain interferes with ambulation. Aggravating factors include wearing enclosed shoe gear. Pain is relieved with periodic professional debridement.  Chief Complaint  Patient presents with   [facility]    Diabetic   New problem(s): None.   PCP is Latisha Ronal Crank, PA-C.LOV 02/27/2023.  Allergies  Allergen Reactions   Tape Other (See Comments)    Paper tape rips skin.    Semaglutide  Rash    Rybelsus  3 mg    Review of Systems: Negative except as noted in the HPI.  Objective: No changes noted in today's physical examination. There were no vitals filed for this visit. [name] is a pleasant 73 y.o. female WD, WN in NAD. AAO x 3.  Vascular Examination: CFT <3 seconds b/l. DP pulses faintly palpable b/l. PT pulses nonpalpable b/l. Digital hair absent. Skin temperature gradient warm to warm b/l. No pain with calf compression. No ischemia or gangrene. No cyanosis or clubbing noted b/l.    Neurological Examination: Sensation grossly intact b/l with 10 gram monofilament.   Dermatological Examination: Pedal skin warm and supple b/l. No open wounds b/l. No interdigital macerations. Toenails 1-5 b/l thick, discolored, elongated with subungual debris and pain on dorsal palpation. No hyperkeratotic nor porokeratotic lesions.  Musculoskeletal Examination: Muscle strength 5/5 to all lower extremity muscle groups bilaterally. No pain, crepitus or joint limitation noted with ROM bilateral LE. No gross bony deformities bilaterally.  Radiographs: None  Last HgA1c:      Latest Ref Rng & Units 05/30/2023   10:25 AM  Hemoglobin A1C  Hemoglobin-A1c 4.0 - 5.6 % 6.9     Assessment/Plan: 1. Pain due to onychomycosis of toenails of both feet   2. Type II  diabetes mellitus with peripheral circulatory disorder [facility])     Patient was evaluated and treated. All patient's and/or POA's questions/concerns addressed on today's visit. Mycotic toenails 1-5 debrided in length and girth without incident.  Continue daily foot inspections and monitor blood glucose per PCP/Endocrinologist's recommendations.Continue soft, supportive shoe gear daily. Report any pedal injuries to medical professional. Call office if there are any quesitons/concerns. -Patient/POA to call should there be question/concern in the interim.   Return in about 3 months (around 12/06/2023).  [name], DPM      [place] LOCATION: [address] [place] [address]                   Office [phone]   [facility] LOCATION: [address] [place], [place] [address] Office [phone]

## 2023-09-14 ENCOUNTER — Encounter: Payer: Self-pay | Admitting: Endocrinology

## 2023-09-14 ENCOUNTER — Ambulatory Visit: Payer: Self-pay | Admitting: Endocrinology

## 2023-09-14 ENCOUNTER — Ambulatory Visit: Admitting: Endocrinology

## 2023-09-14 VITALS — BP 126/70 | HR 76 | Resp 20 | Ht 62.0 in | Wt 112.6 lb

## 2023-09-14 DIAGNOSIS — Z794 Long term (current) use of insulin: Secondary | ICD-10-CM | POA: Diagnosis not present

## 2023-09-14 DIAGNOSIS — E1165 Type 2 diabetes mellitus with hyperglycemia: Secondary | ICD-10-CM

## 2023-09-14 LAB — POCT GLYCOSYLATED HEMOGLOBIN (HGB A1C): Hemoglobin A1C: 6.6 % — AB (ref 4.0–5.6)

## 2023-09-14 MED ORDER — JARDIANCE 10 MG PO TABS: 10.0000 mg | ORAL_TABLET | Freq: Every morning | ORAL | 3 refills | Status: AC

## 2023-09-14 MED ORDER — TRESIBA FLEXTOUCH 100 UNIT/ML ~~LOC~~ SOPN: 8.0000 [IU] | PEN_INJECTOR | Freq: Every day | SUBCUTANEOUS | 2 refills | Status: DC

## 2023-09-14 MED ORDER — INSULIN LISPRO (1 UNIT DIAL) 100 UNIT/ML (KWIKPEN): PEN_INJECTOR | SUBCUTANEOUS | 3 refills | Status: AC

## 2023-09-14 NOTE — Progress Notes (Signed)
 Outpatient Endocrinology Note [place] Lorence Nagengast, MD  09/14/23  Patient's Name: [name]    DOB: 1950-11-07    MRN: [id number]                                                    REASON OF VISIT: Follow up for type 2 diabetes mellitus  PCP: Latisha Ronal Crank, PA-C  HISTORY OF PRESENT ILLNESS:   [name] is a 73 y.o. old female with past medical history listed below, is here for follow up of type 2 diabetes mellitus.   Pertinent Diabetes History: Patient was diagnosed with type 2 diabetes mellitus in 2007.  Patient had transplant in January 2024 in [place] .  Patient used to be on oral antidiabetic medication in the past.  She is currently on basal bolus insulin  regimen.  Chronic Diabetes Complications : Retinopathy: yes, no records available to review.  Last ophthalmology exam was done on annually, reportedly. 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 Peripheral neuropathy: no Coronary artery disease: no Stroke: no  Relevant comorbidities and cardiovascular risk factors: Obesity: no Body mass index is 20.59 kg/m.  Hypertension: yes Hyperlipidemia. yes  Current / Home Diabetic regimen includes: Tresiba  8 units in the morning. Humalog  3 - 4 units with meals 2-3 times a day. Jardiance  10 mg daily.  Prior diabetic medications: Metformin , repaglinide , Actos.  Glycemic data:    CONTINUOUS GLUCOSE MONITORING SYSTEM (CGMS) INTERPRETATION: At today's visit, we reviewed CGM downloads. The full report is scanned in the media. Reviewing the CGM trends, blood glucose are as follows:  Dexcom G7 CGM-  Sensor Download (Sensor download was reviewed and summarized below.) Dates: August 8 to August 21 , 2025 , 14 days  Glucose Management Indicator: 7.1%     Interpretation: Mostly acceptable blood sugar overnight and in the afternoon with occasional mild hyperglycemia with meals and frequent hyperglycemia at  bedtime due to bedtime snacks with blood sugar up to 200-250 range.  No hypoglycemia.  Hypoglycemia: Patient has no hypoglycemic episodes. Patient has hypoglycemia awareness.   Factors modifying glucose control: 1.  Diabetic diet assessment: Eating 3 meals a day and sometimes eats Diet Coke and donut.  Bedtime snacks.  2.  Staying active or exercising: No formal exercise.  3.  Medication compliance: compliant all of the time.  Interval history Diabetes regimen as reviewed and noted above.  CGM data as reviewed above.  She usually eats bedtime snacks causing hyperglycemia.  She denies numbness and tingling of the feet.  No vision problem.  No other complaints today.  Hemoglobin A1c improved to 6.6%.  Patient laboratory results reviewed.  Normal renal function.  Patient reports she has been taking Jardiance  10 mg daily, renewed medication today.  REVIEW OF SYSTEMS As per history of present illness.   PAST MEDICAL HISTORY: Past Medical History:  Diagnosis Date   Arthritis    Chronic back pain    Chronic kidney disease (CKD)    Constipation    COPD (chronic obstructive pulmonary disease) (HCC)    Cough    Diabetes mellitus, type 2 (HCC)    Diverticulosis    Fibroid    patient thinks this was the reason for her hysterectomy   GERD (gastroesophageal reflux disease)    Heart murmur    History of sebaceous cyst  Hyperlipemia    Hyperplastic colon polyp    Hypertension    Hyperthyroidism    Lumbar radiculopathy    Shortness of breath 09/13/2013   Stroke [facility]) 2015?   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    PAST SURGICAL HISTORY: Past Surgical History:  Procedure Laterality Date   ABDOMINAL HYSTERECTOMY     --?ovaries remain   AV FISTULA PLACEMENT Left 07/16/2020   Procedure: LEFT BRACHIOCEPHALIC ARTERIOVENOUS (AV) FISTULA CREATION;  Surgeon: Magda Debby SAILOR, MD;  Location: [facility] OR;  Service: Vascular;  Laterality:  Left;  PERIPHERAL NERVE BLOCK   COLONOSCOPY W/ BIOPSIES  09/11/2008   ESOPHAGOGASTRODUODENOSCOPY (EGD) WITH PROPOFOL  N/A 05/20/2020   Procedure: ESOPHAGOGASTRODUODENOSCOPY (EGD) WITH PROPOFOL ;  Surgeon: Abran Norleen SAILOR, MD;  Location: [facility] ENDOSCOPY;  Service: Endoscopy;  Laterality: N/A;   GIVENS CAPSULE STUDY N/A 06/28/2020   Procedure: GIVENS CAPSULE STUDY;  Surgeon: Abran Norleen SAILOR, MD;  Location: [facility] ENDOSCOPY;  Service: Endoscopy;  Laterality: N/A;   RETINOPATHY SURGERY Bilateral 2013   Dr. alvia    ALLERGIES: Allergies  Allergen Reactions   Tape Other (See Comments)    Paper tape rips skin.    Semaglutide  Rash    Rybelsus  3 mg    FAMILY HISTORY:  Family History  Problem Relation Age of Onset   Hypertension Mother    Sleep apnea Mother    Diabetes Mother    Hyperlipidemia Mother    Lung cancer Father        smoker   Hypertension Sister    Diabetes Sister    Hypertension Sister    Hypertension Brother    Diabetes Brother    Hypertension Brother    Breast cancer Neg Hx    Colon cancer Neg Hx    Esophageal cancer Neg Hx    Pancreatic cancer Neg Hx    Liver disease Neg Hx    Colon polyps Neg Hx    BRCA 1/2 Neg Hx     SOCIAL HISTORY: Social History   Socioeconomic History   Marital status: Divorced    Spouse name: Not on file   Number of children: 1   Years of education: 11   Highest education level: Not on file  Occupational History   Occupation: Engineer, drilling: [employer]    Comment: Retired  Tobacco Use   Smoking status: Some Days    Current packs/day: 0.00    Average packs/day: 0.5 packs/day for 35.0 years (17.5 ttl pk-yrs)    Types: Cigarettes    Start date: 07/07/1979    Last attempt to quit: 07/07/2014    Years since quitting: 9.1   Smokeless tobacco: Never   Tobacco comments:    Smoking some again    02/12/2022 patient smokes 1 -2 some days  Vaping Use   Vaping status: Never Used  Substance and Sexual Activity   Alcohol use:  No   Drug use: No   Sexual activity: Never    Partners: Male    Birth control/protection: Surgical    Comment: TAH--?ovaries remain  Other Topics Concern   Not on file  Social History Narrative   Divorced 1 son    Retired Administrator, sports for [employer]   Former smoker no alcohol caffeine or drug use report denies abuse and feels safe at home.   Social Drivers of Health   Financial Resource Strain: Medium Risk (08/23/2019)  Overall Financial Resource Strain (CARDIA)    Difficulty of Paying Living Expenses: Somewhat hard  Food Insecurity: Low Risk  (06/08/2022)   Received from [facility]   Hunger Vital Sign    Within the past 12 months, you worried that your food would run out before you got money to buy more: Never true    Within the past 12 months, the food you bought just didn't last and you didn't have money to get more. : Never true  Transportation Needs: Not on file (06/08/2022)  Physical Activity: Sufficiently Active (08/23/2019)   Exercise Vital Sign    Days of Exercise per Week: 5 days    Minutes of Exercise per Session: 30 min  Stress: No Stress Concern Present (08/23/2019)   [employer] of Occupational Health - Occupational Stress Questionnaire    Feeling of Stress : Not at all  Social Connections: Moderately Integrated (07/07/2017)   Social Connection and Isolation Panel    Frequency of Communication with Friends and Family: More than three times a week    Frequency of Social Gatherings with Friends and Family: More than three times a week    Attends Religious Services: More than 4 times per year    Active Member of [employer] or Organizations: Yes    Attends Engineer, structural: More than 4 times per year    Marital Status: Divorced    MEDICATIONS:  Current Outpatient Medications  Medication Sig Dispense Refill   atorvastatin  (LIPITOR ) 40 MG tablet Take 40 mg by mouth at bedtime.     blood glucose meter kit and supplies KIT Dispense based on  patient and insurance preference. Use up to four times daily as directed. 1 each 0   Blood Glucose Monitoring Suppl (ONETOUCH VERIO FLEX SYSTEM) w/Device KIT 1 each by Does not apply route 2 (two) times daily. E11.9     Continuous Blood Gluc Receiver (DEXCOM G7 RECEIVER) DEVI Used to monitor blood sugar. Replace once per year.     Continuous Glucose Sensor (DEXCOM G7 SENSOR) MISC Change sensor every 10 days as directed by manufacturer 6 each 6   glucose blood (ONETOUCH VERIO) test strip USE AS DIRECTED TWICE DAILY 200 strip 3   Insulin  Pen Needle 32G X 4 MM MISC Use 4x a day 300 each 3   metoprolol  succinate (TOPROL -XL) 100 MG 24 hr tablet Take 100 mg by mouth in the morning.     Multiple Vitamin (MULTIVITAMIN WITH MINERALS) TABS tablet Take 1 tablet by mouth in the morning.     mycophenolate (CELLCEPT) 250 MG capsule Take 250 mg by mouth 2 (two) times daily.     NIFEdipine (PROCARDIA XL/NIFEDICAL XL) 60 MG 24 hr tablet Take 60 mg by mouth 2 (two) times daily.     OneTouch Delica Lancets 33G MISC 1 each by Does not apply route 2 (two) times daily. E11.9     pantoprazole  (PROTONIX ) 40 MG tablet Take 1 tablet (40 mg total) by mouth daily. 90 tablet 3   polyethylene glycol (MIRALAX  / GLYCOLAX ) 17 g packet Take 17 g by mouth daily as needed (constipation.).     predniSONE  (DELTASONE ) 5 MG tablet Take 5 mg by mouth in the morning.     spironolactone (ALDACTONE) 50 MG tablet Take 50 mg by mouth 2 (two) times daily.     tacrolimus  (PROGRAF ) 1 MG capsule Take 3 mg by mouth in the morning and at bedtime.     Varenicline Tartrate, Starter, 0.5 MG X 11 &  1 MG X 42 TBPK Take 0.5-1 mg by mouth as directed. TAKE 1 TABLET DAILY ON DAYS 1-3 AND 1 TABLET TWICE DAILY ON DAYS 4-7. THEN 1 TAB TWICE DAIL     insulin  degludec (TRESIBA  FLEXTOUCH) 100 UNIT/ML FlexTouch Pen Inject 8 Units into the skin daily. 15 mL 2   insulin  lispro (HUMALOG ) 100 UNIT/ML KwikPen 3 to 4 units before each meal 15 mL 3   JARDIANCE  10 MG  TABS tablet Take 1 tablet (10 mg total) by mouth in the morning. 90 tablet 3   No current facility-administered medications for this visit.    PHYSICAL EXAM: Vitals:   09/14/23 1051  BP: 126/70  Pulse: 76  Resp: 20  SpO2: 96%  Weight: 112 lb 9.6 oz (51.1 kg)  Height: 5' 2 (1.575 m)    Body mass index is 20.59 kg/m.  Wt Readings from Last 3 Encounters:  09/14/23 112 lb 9.6 oz (51.1 kg)  05/30/23 117 lb 6.4 oz (53.3 kg)  07/15/22 115 lb (52.2 kg)    General: Well developed, well nourished female in no apparent distress.  HEENT: AT/[place], no external lesions.  Eyes: Conjunctiva clear and no icterus. Neck: Neck supple  Lungs: Respirations not labored Neurologic: Alert, oriented, normal speech Extremities / Skin: Dry. Psychiatric: Does not appear depressed or anxious  Diabetic Foot Exam - Simple   No data filed    LABS Reviewed Lab Results  Component Value Date   HGBA1C 6.6 (A) 09/14/2023   HGBA1C 6.9 (A) 05/30/2023   HGBA1C 7.0 (A) 09/09/2022   Lab Results  Component Value Date   FRUCTOSAMINE 363 (H) 04/27/2022   FRUCTOSAMINE 315 (H) 01/28/2022   FRUCTOSAMINE 294 (H) 09/29/2021   Lab Results  Component Value Date   CHOL 155 07/11/2018   HDL 44.70 07/11/2018   LDLCALC 80 07/11/2018   LDLDIRECT 69.0 09/29/2021   TRIG 151.0 (H) 07/11/2018   CHOLHDL 3 07/11/2018   Lab Results  Component Value Date   MICRALBCREAT 679.6 (H) 04/09/2008   Lab Results  Component Value Date   CREATININE 0.92 08/28/2023   Lab Results  Component Value Date   GFR 25.74 (L) 07/11/2018    ASSESSMENT / PLAN  1. Uncontrolled type 2 diabetes mellitus with hyperglycemia, with long-term current use of insulin  (HCC)      Diabetes Mellitus type 2, complicated by diabetic nephropathy, ESRD s/p renal transplant - Diabetic status / severity: fair controlled.  Improving.  Lab Results  Component Value Date   HGBA1C 6.6 (A) 09/14/2023    - Hemoglobin A1c goal : <6.5%  Diabetes  control improving.  Usually bedtime hyperglycemia, advised patient to stop bedtime snacks.  - Medications: See below.  No change.  I) continue Tresiba  8 units daily in the morning. II) continue Humalog  3 -4  units with meals 3 times a day.  Advised to take Humalog  up to 5 units for large meals.  Discussed about avoiding bedtime snacks however if she takes bedtime snack asked to take Humalog  2 units. III) continue Jardiance  10 mg daily.  - Home glucose testing: Dexcom G7 and check as needed. - Discussed/ Gave Hypoglycemia treatment plan.  # Consult : not required at this time.   # She is status post renal transplant, following with nephrology and transplant team.  Patient reports she had a urine test with nephrology recently.  On Jardiance . Last  Lab Results  Component Value Date   MICRALBCREAT 679.6 (H) 04/09/2008    # Foot check  nightly.  # She has diabetic retinopathy, following with ophthalmology.  No detail records available to review.  - Diet: Eat reasonable portion sizes to promote a healthy weight - Life style / activity / exercise: Discussed.  2. Blood pressure  -  BP Readings from Last 1 Encounters:  09/14/23 126/70    - Control is in target.  - No change in current plans.  3. Lipid status / Hyperlipidemia - Last  Lab Results  Component Value Date   LDLCALC 80 07/11/2018   - Continue atorvastatin  40 mg daily.  Managed by primary care provider.  Diagnoses and all orders for this visit:  Uncontrolled type 2 diabetes mellitus with hyperglycemia, with long-term current use of insulin  (HCC) -     POCT glycosylated hemoglobin (Hb A1C) -     insulin  lispro (HUMALOG ) 100 UNIT/ML KwikPen; 3 to 4 units before each meal -     insulin  degludec (TRESIBA  FLEXTOUCH) 100 UNIT/ML FlexTouch Pen; Inject 8 Units into the skin daily. -     JARDIANCE  10 MG TABS tablet; Take 1 tablet (10 mg total) by mouth in the morning.    DISPOSITION Follow up in clinic in 4  months  suggested.   All questions answered and patient verbalized understanding of the plan.  [place] Kamren Heintzelman, MD [facility] [address] [place], [address] [place], [place] [address] Phone # [phone]  At least part of this note was generated using voice recognition software. Inadvertent word errors may have occurred, which were not recognized during the proofreading process.

## 2023-10-11 ENCOUNTER — Other Ambulatory Visit: Payer: Self-pay | Admitting: *Deleted

## 2023-10-11 ENCOUNTER — Encounter: Payer: Self-pay | Admitting: *Deleted

## 2023-10-11 NOTE — Patient Outreach (Signed)
 Aging Gracefully Program  RN Visit  10/14/23  [name] 12-27-1950 [id number]  Visit:  RN Visit Number: 3- Third Visit  RN TIME CALCULATION: Start TIme:  RN Start Time Calculation: 1000 End Time:  RN Stop Time Calculation: 1100 Total Minutes:  RN Time Calculation: 60  Readiness To Change Score:  Readiness to Change Score: 9.33  Universal RN Interventions: Calendar Distribution: No Exercise Review: Yes Medications: Yes Medication Changes: No Mood: Yes Pain: Yes PCP Advocacy/Support: No Fall Prevention: Yes Incontinence: Yes Clinician View Of Client Situation: Arrived for home visit. [name] answered door. Ambulates independently without assitive device. Living room area neat, clean, and without clutter. CHS present doing home modifications. Client View Of His/Her Situation: [name] denies pain. States she has been smoking more lately. Also reports snacking at night before bed which increases her blood sugar.  Healthcare Provider Communication: Did Surveyor, mining With [employer] Provider?: No Healthcare Provider Response According to RN: n/a According to Client, Did PCP Report Communication With An Aging Gracefully RN?: No Healthcare Provider Response According To Client: n/a  Clinician View of Client Situation: Clinician View Of Client Situation: Arrived for home visit. [name] answered door. Ambulates independently without assitive device. Living room area neat, clean, and without clutter. CHS present doing home modifications. Client's View of His/Her Situation: Client View Of His/Her Situation: [name] denies pain. States she has been smoking more lately. Also reports snacking at night before bed which increases her blood sugar.  Medication Assessment: Reviewed    OT Update: Pending CHS' modifications and repairs  Session Summary: [name] is doing well. She is engaging and receptive during home visit.   Goals Addressed                This Visit's Progress     AG RN (pt-stated)        07/26/23  Assessment: [name] denies pain. Reports she has had multiple falls when getting out of the shower/tub in the bathroom. States she looks forward to [name] home modifications. [name] reports being independent and still drives. States she has a cane but does not use it. Endorses smoking 1.5 packs of cigarettes a day. States she wants to quit. Reports being s/p kidney transplant in January 2024. Goes to [name] system for follow up.   Interventions: Provided chronic disease management booklet, writer's contact information, calendar book, and DM booklet. Discussed fall precautions. Encouraged [name] to use cane more and to at least have it nearby.  Encouraged [name] to carry cell phone on her at all times since she does not have a medical alert system. Discussed strategies for smoking cessation. Discussed EMMI videos/articles for smoking cessation. Verified [name]' email address.  Plan: Scheduled next home visit for 08-16-25at 10 am. Send EMMI smoking cessation videos/articles via email.   CLIENT/RN ACTION PLAN - STOP SMOKING  Registered Nurse:  Pablo Hurst  Date: 07/26/2023  Client Name: [name]  Client ID:    Target Area:  STOP SMOKING    What does client want to be able to do? To stop smoking    Years smoking: 20 + years   Packs per day:  1.5   STRATEGIES Ideas Strategies  Why Quit? Reviewed 07/26/23 Reviewed September 09, 2023 Reviewed 10-14-23 Save money Feel better You and your family will be more healthy. Set a good example for your children and grandchildren.   Risks to You Reviewed 07/26/23 Reviewed 09/09/23 Reviewed 10/14/23 Death Cancer:  lung, colon, bladder, mouth, stomach and pancreas Arthritis Asthma, couch and Chronic Obstructive Pulmonary Disease Heart Disease including high blood pressure  Risks to Your Family Reviewed 07/26/23 Reviewed 09/06/23 Reviewed 10/11/23  Cancer Asthma, allergies and pneumonia Heart Disease Ear Infections   Be Patient Yourself Reviewed 07/26/23 Reviewed 09/06/23 Reviewed 10/11/23 Many people try to quit several times before they are successful. If you slip and do smoke, don't get mad at yourself, try to stop again the next day.    How to Quit Smoking Reviewed 07/26/23 Reviewed 09/06/23 Reviewed 10/11/23 Many people who quit use a lot of different tools at the same time: Support group Medications Nicotine  patches or Nicotine  gum Support from family and friends    Set a Quit Date Reviewed 07/26/23 Reviewed 09/06/23 Reviewed 10/11/23 A quit date gives you a goal to reach. It is a promise to yourself and your lived ones that you will quit. If you decide today that you want to quit, stet your quit date 2-3 weeks from now.     Medications Reviewed 07/26/23 Reviewed 09/06/23 Reviewed 10/11/23 Medications can decrease Nicotine  cravings. Zyban, Wellbutrin, or Chantix are medicines people take to help quit smoking.  You need a prescription from your Primary Care Provider.   Nicotine  Cravings Reviewed 07/26/23 Reviewed 09/06/23 Reviewed 10/11/23 Wait for craving to go away - be patient. Chew gum. Wear Nicotine  patches. Take Medication.   Know your triggers. Reviewed 07/26/23 Reviewed 09/06/23 Reviewed 10/11/23 Common triggers At a party Boredom Stress Drinking alcohol Hanging out with friends who smoke   Dealing with Triggers  Reviewed 07/26/23 Reviewed 09/06/23 Reviewed 10/11/23 Distract yourself, go for a walk or exercise. Avoid friends whom smoke. Pray Listen to music. Take a bath. Wait for the urge to pass. Take slow deep breaths until the urge for cigarette goes away.   Get Support Reviewed 07/26/23 Reviewed 09/06/23 Reviewed 10/11/23 Support groups Family and friends Smoking quit line, [phone] [name]) CDC help quit smoking [phone] [place] offers QuitSmart smoking cessation  classes call [phone] to register   More information Reviewed 07/26/23 Reviewed 09/06/23 Reviewed 10/11/23 "Pathways to Freedom" by the CDC  MysteryRaffle.it quit/pathways/ "Clearing the Air" by the [employer], NIH http://www.smokefree.gove/pubs/Clearing_the_Air_508.pdf http://www.smokefree/gov/   Other    PRACTICE It is important to practice the strategies so we can determine if they will be effective in helping to reach the goal.    Follow these specific recommendations:        If strategy does not work the first time, try it again.     We may make some changes over the next few sessions.       Pablo Hurst, MSN, RN, BSN [name], Healthy Communities RN Case Manager for Aging Gracefully Direct Dial : [phone]      09/06/23  Assessment: Ms. Tech reports she smokes 1/2 pack of cigarettes now. She was previously smoking 1.5 packs a day.  States she distracts herself with doing something else when she wants a cigarette. States she stopped taking the medication for smoking cessation because it caused nausea. Also states she has been without her atorvastatin .and spironolactone because she needs refills. States her transplant doctor told her to get her local physicians to refill the medications. Reports going to local food pantries regularly. Still drives. States she is not interested in Meals on Wheels because she rather cook her own food. Denies any recent falls.  Interventions: Performed Aging Gracefully home exercises and provided booklet. Ms. Stoney  very engaged with returned demonstration. Encouraged Ms. Donnelly to contact PCP to request refills on medications (she called PCP office during visit to request). Encouraged Ms. Perfecto to schedule PCP visit as well as inform PCP about not finishing medication for smoking cessation. Praised Ms. Coppens for taking initiative in reaching out to her  doctors. Discussed fall precautions. Encouraged Ms. Cenci to have cane accessible at all times.  Plan: Scheduled next home visit for September 17th at 10 am.   Pablo Hurst, MSN, RN, BSN [place]  [place] [name], Healthy Communities RN Case Manager for Aging Gracefully Direct Dial : [phone]     10/11/23  Assessment: Ms. Convery reports she has been smoking a little more than 1/2 pack of cigarettes lately. States this is an increase. Denies having any particular reason as to why she is smoking more. Denies pain or recent falls. States she did not ask her PCP for another prescription for smoking cessation medication yet. States she will ask at her next PCP appointment on 9/25. States she in interested in crocheting. Reports she plans to ask her sister for crocheting lessons. Her sister lives on the same street. Ms. Luton endorses eating nighttime snacks before bed. States her blood sugars are elevated in the morning.   Interventions: Encouraged Ms. Lill to follow thru with crocheting lessons from her sister. Discussed a new hobby will be a great distraction from smoking. Encouraged Ms. Tuazon to cut back on nighttime snacking. Discussed healthier food snack options if she continues to snack. Re-sent smoking cessation and DM and nutrition EMMI programs to Ms. Mcelmurry' email. Discussed how to access EMMI programs from her email. Encouraged Ms. Hust to contact her insurance provider to inquire about Silver Sneakers benefit. Provided local recreation center contact information and address. Encouraged Ms. Tercero to perform Aging Gracefully exercises and to participate in Senior exercise/walking program at [name] on Monday, Wednesday, Fridays.  Plan: Scheduled next home visit for 11/15/23 at 10 am.                                           Pablo Hurst, MSN, RN, BSN [place]  [place] [name], Healthy Communities RN  Case Manager for Aging Gracefully Direct Dial : [phone]

## 2023-10-11 NOTE — Patient Instructions (Signed)
 Visit Information  Thank you for taking time to visit with me today. Please don't hesitate to contact me if I can be of assistance to you before our next scheduled home appointment.  Following are the goals we discussed today:   Goals Addressed               This Visit's Progress     AG RN (pt-stated)        07/26/23  Assessment: [name] denies pain. Reports she has had multiple falls when getting out of the shower/tub in the bathroom. States she looks forward to [facility] home modifications. [name] reports being independent and still drives. States she has a cane but does not use it. Endorses smoking 1.5 packs of cigarettes a day. States she wants to quit. Reports being s/p kidney transplant in January 2024. Goes to [facility] for follow up.   Interventions: Provided chronic disease management booklet, writer's contact information, calendar book, and DM booklet. Discussed fall precautions. Encouraged [name] to use cane more and to at least have it nearby.  Encouraged [name] to carry cell phone on her at all times since she does not have a medical alert system. Discussed strategies for smoking cessation. Discussed EMMI videos/articles for smoking cessation. Verified [name]' email address.  Plan: Scheduled next home visit for Sep 03, 2025at 10 am. Send EMMI smoking cessation videos/articles via email.   CLIENT/RN ACTION PLAN - STOP SMOKING  Registered Nurse:  Pablo Hurst  Date: 07/26/2023  Client Name: [name]  Client ID:    Target Area:  STOP SMOKING    What does client want to be able to do? To stop smoking    Years smoking: 20 + years   Packs per day:  1.5   STRATEGIES Ideas Strategies  Why Quit? Reviewed 07/26/23 Reviewed 09-27-23 Reviewed 2023/11/01 Save money Feel better You and your family will be more healthy. Set a good example for your children and grandchildren.   Risks to You Reviewed 07/26/23 Reviewed 09/27/2023 Reviewed Nov 01, 2023  Death Cancer:  lung, colon, bladder, mouth, stomach and pancreas Arthritis Asthma, couch and Chronic Obstructive Pulmonary Disease Heart Disease including high blood pressure  Risks to Your Family Reviewed 07/26/23 Reviewed 27-Sep-2023 Reviewed 11/01/23 Cancer Asthma, allergies and pneumonia Heart Disease Ear Infections   Be Patient Yourself Reviewed 07/26/23 Reviewed 09/27/23 Reviewed 2023-11-01 Many people try to quit several times before they are successful. If you slip and do smoke, don't get mad at yourself, try to stop again the next day.    How to Quit Smoking Reviewed 07/26/23 Reviewed 09-27-2023 Reviewed 2023/11/01 Many people who quit use a lot of different tools at the same time: Support group Medications Nicotine  patches or Nicotine  gum Support from family and friends    Set a Quit Date Reviewed 07/26/23 Reviewed 2023-09-27 Reviewed 01-Nov-2023 A quit date gives you a goal to reach. It is a promise to yourself and your lived ones that you will quit. If you decide today that you want to quit, stet your quit date 2-3 weeks from now.     Medications Reviewed 07/26/23 Reviewed Sep 27, 2023 Reviewed 01-Nov-2023 Medications can decrease Nicotine  cravings. Zyban, Wellbutrin, or Chantix are medicines people take to help quit smoking.  You need a prescription from your Primary Care Provider.   Nicotine  Cravings Reviewed 07/26/23 Reviewed 2023/09/27 Reviewed 11/01/23 Wait for craving to go away - be patient. Chew gum. Wear Nicotine  patches. Take Medication.   Know your triggers. Reviewed  07/26/23 Reviewed 09/06/23 Reviewed 10/11/23 Common triggers At a party Boredom Stress Drinking alcohol Hanging out with friends who smoke   Dealing with Triggers  Reviewed 07/26/23 Reviewed 09/06/23 Reviewed 10/11/23 Distract yourself, go for a walk or exercise. Avoid friends whom smoke. Pray Listen to music. Take a bath. Wait for the urge to pass. Take slow deep breaths until the urge for cigarette goes away.    Get Support Reviewed 07/26/23 Reviewed 09/06/23 Reviewed 10/11/23 Support groups Family and friends Smoking quit line, 4312162855 [facility]) CDC help quit smoking [phone] [place] offers QuitSmart smoking cessation classes call [phone] to register   More information Reviewed 07/26/23 Reviewed 09/06/23 Reviewed 10/11/23 "Pathways to Freedom" by the CDC  MysteryRaffle.it quit/pathways/ "Clearing the Air" by the [employer], NIH http://www.smokefree.gove/pubs/Clearing_the_Air_508.pdf http://www.smokefree/gov/   Other    PRACTICE It is important to practice the strategies so we can determine if they will be effective in helping to reach the goal.    Follow these specific recommendations:        If strategy does not work the first time, try it again.     We may make some changes over the next few sessions.       Pablo Hurst, MSN, RN, BSN [facility], Healthy Communities RN Case Manager for Aging Gracefully Direct Dial : [phone]      09/06/23  Assessment: [name] reports she smokes 1/2 pack of cigarettes now. She was previously smoking 1.5 packs a day.  States she distracts herself with doing something else when she wants a cigarette. States she stopped taking the medication for smoking cessation because it caused nausea. Also states she has been without her atorvastatin .and spironolactone because she needs refills. States her transplant doctor told her to get her local physicians to refill the medications. Reports going to local food pantries regularly. Still drives. States she is not interested in Meals on Wheels because she rather cook her own food. Denies any recent falls.  Interventions: Performed Aging Gracefully home exercises and provided booklet. Ms. Pinkard very engaged with returned demonstration. Encouraged Ms. Matulich to contact PCP to request refills on  medications (she called PCP office during visit to request). Encouraged Ms. Vanlanen to schedule PCP visit as well as inform PCP about not finishing medication for smoking cessation. Praised Ms. Kilman for taking initiative in reaching out to her doctors. Discussed fall precautions. Encouraged Ms. Kludt to have cane accessible at all times.  Plan: Scheduled next home visit for September 17th at 10 am.   Pablo Hurst, MSN, RN, BSN [place]  [facility], Healthy Communities RN Case Manager for Aging Gracefully Direct Dial : [phone]     10/11/23  Assessment: Ms. Fuller reports she has been smoking a little more than 1/2 pack of cigarettes lately. States this is an increase. Denies having any particular reason as to why she is smoking more. Denies pain or recent falls. States she did not ask her PCP for another prescription for smoking cessation medication yet. States she will ask at her next PCP appointment on 9/25. States she in interested in crocheting. Reports she plans to ask her sister for crocheting lessons. Her sister lives on the same street. Ms. Nachreiner endorses eating nighttime snacks before bed. States her blood sugars are elevated in the morning.   Interventions: Encouraged Ms. Tompkins to follow thru with crocheting lessons from her sister. Discussed a new hobby will be a great distraction from smoking. Encouraged Ms. [name]  to cut back on nighttime snacking. Discussed healthier food snack options if she continues to snack. Re-sent smoking cessation and DM and nutrition EMMI programs to Ms. Lady' email. Discussed how to access EMMI programs from her email. Encouraged Ms. Ruffino to contact her insurance provider to inquire about Silver Sneakers benefit. Provided local recreation center contact information and address. Encouraged Ms. Courser to perform Aging Gracefully exercises and to participate in Senior exercise/walking program at [facility] on  Monday, Wednesday, Fridays.  Plan: Scheduled next home visit for 11/15/23 at 10 am.                                             Our next appointment is on 11/15/23 at 10am  If you are experiencing a Mental Health or Behavioral Health Crisis or need someone to talk to, please call the Suicide and Crisis Lifeline: 988 call the [place]  National Suicide Prevention Lifeline: 629 048 2295 or TTY: 3024852757 TTY (340) 329-3268) to talk to a trained counselor call 1-800-273-TALK (toll free, 24 hour hotline) go to [facility] [address], [place] [phone]) call 911   The patient verbalized understanding of instructions, educational materials, and care plan provided today and agreed to receive a mailed copy of patient instructions, educational materials, and care plan.   Pablo Hurst, MSN, RN, BSN [place]  [facility], Healthy Communities RN Case Manager for Aging Gracefully Direct Dial : [phone]

## 2023-10-18 ENCOUNTER — Other Ambulatory Visit: Payer: Self-pay | Admitting: Occupational Therapy

## 2023-10-18 NOTE — Patient Outreach (Signed)
 Aging Gracefully Program  OT Follow-Up Visit  10/18/2023  [name] [birth year] [id number]  Visit:  2- Second Visit  Start Time:  1500 End Time:  1520 Total Minutes:  20  Readiness to Change Score :  Readiness to Change Score: 10    Patient Education: Education Provided: Yes Education Details: Discussed benefits of a shower chair. Person(s) Educated: Patient Comprehension: Verbalized Understanding  Goals:   Goals Addressed             This Visit's Progress    COMPLETED: Patient Stated       Pt will improve mobility in getting on /off the commode independently using grab bars/AE for safety.    OT ACTION PLAN: Functional Mobility  Target Problem Area:   Getting on/off the commode  Why Problem May Occur:     Low commodes  No grab bars  Knee pain   Target Goal(s):   Pt will improve mobility in getting on /off the commode independently using grab bars/AE for safety.     STRATEGIES   Saving Your Energy DO:  Take breaks  Raise the height of surfaces   Remove tripping hazards  (Other): Use grab bars     Modifying your home environment and making it safe    DO:  Install grab bars in the bathroom  Remove or strongly secure throw rugs  (Other):Have good lighting     Simplifying the way you set up tasks or daily routines DO:  Move slowly      PRACTICE  Based on what we have talked about, you are willing to try:  Use grab bars for stability   Ensure feet are stable before pulling to stand  If a strategy does not work the first time, try it again (and again).  We may make some changes over the next few sessions, based on how they work.              Post Clinical Reasoning: Client Action (Goal) One Interventions: Pt will improve mobility in getting on /off the commode independently using grab bars/AE for safety. Did Client Try?: Yes Targeted Problem Area Status: A Little Better  Clinician View Of Client Situation:: Higher,  handicap height commode has been installed and flooring replaced in master bathroom. Pt thinks grab bars will be installed soon. Higher commode height has significantly improved pt's success with getting on/off the commode with her knee pain.  Client View Of His/Her Situation:: Very excited about commode and shower installation. Continues to be motivated to keep up her mobility. Interested in shower seat for walk in shower considering her mobility and knee pain she experiences at times.  Next Visit Plan:: Provide shower seat, assess walk in shower success   Sonny Cory, OTR/L  [phone] 10/18/23

## 2023-11-15 ENCOUNTER — Other Ambulatory Visit: Payer: Self-pay | Admitting: *Deleted

## 2023-11-15 NOTE — Patient Instructions (Signed)
 Visit Information  Thank you for taking time to visit with me today. Please don't hesitate to contact me if I can be of assistance to you before our next scheduled home appointment.  Following are the goals we discussed today:   Goals Addressed               This Visit's Progress     AG RN (pt-stated)        07/26/23  Assessment: [name] denies pain. Reports she has had multiple falls when getting out of the shower/tub in the bathroom. States she looks forward to [facility] home modifications. [name] reports being independent and still drives. States she has a cane but does not use it. Endorses smoking 1.5 packs of cigarettes a day. States she wants to quit. Reports being s/p kidney transplant in January 2024. Goes to [facility] hospital system for follow up.   Interventions: Provided chronic disease management booklet, writer's contact information, calendar book, and DM booklet. Discussed fall precautions. Encouraged [name] to use cane more and to at least have it nearby.  Encouraged [name] to carry cell phone on her at all times since she does not have a medical alert system. Discussed strategies for smoking cessation. Discussed EMMI videos/articles for smoking cessation. Verified [name]' email address.  Plan: Scheduled next home visit for August 13th at 10 am. Send EMMI smoking cessation videos/articles via email.   CLIENT/RN ACTION PLAN - STOP SMOKING  Registered Nurse:  Pablo Hurst  Date: 07/26/2023  Client Name: [name]  Client ID:    Target Area:  STOP SMOKING    What does client want to be able to do? To stop smoking    Years smoking: 20 + years   Packs per day:  1.5   STRATEGIES Ideas Strategies  Why Quit? Reviewed 07/26/23 Reviewed 09/06/23 Reviewed 2023/10/15 Reviewed 2023-11-19 Save money Feel better You and your family will be more healthy. Set a good example for your children and grandchildren.   Risks to You Reviewed 07/26/23 Reviewed  09/06/23 Reviewed 10/15/2023 Reviewed 11-19-23 Death Cancer:  lung, colon, bladder, mouth, stomach and pancreas Arthritis Asthma, couch and Chronic Obstructive Pulmonary Disease Heart Disease including high blood pressure  Risks to Your Family Reviewed 07/26/23 Reviewed 09/06/23 Reviewed 15-Oct-2023 Reviewed 19-Nov-2023 Cancer Asthma, allergies and pneumonia Heart Disease Ear Infections   Be Patient Yourself Reviewed 07/26/23 Reviewed 09/06/23 Reviewed October 15, 2023 Reviewed 11-19-2023 Many people try to quit several times before they are successful. If you slip and do smoke, don't get mad at yourself, try to stop again the next day.    How to Quit Smoking Reviewed 07/26/23 Reviewed 09/06/23 Reviewed 10-15-2023 Reviewed 19-Nov-2023 Many people who quit use a lot of different tools at the same time: Support group Medications Nicotine  patches or Nicotine  gum Support from family and friends    Set a Quit Date Reviewed 07/26/23 Reviewed 09/06/23 Reviewed 10-15-23 Reviewed 11/19/23 A quit date gives you a goal to reach. It is a promise to yourself and your lived ones that you will quit. If you decide today that you want to quit, stet your quit date 2-3 weeks from now.     Medications Reviewed 07/26/23 Reviewed 09/06/23 Reviewed 2023/10/15 Reviewed Nov 19, 2023 Medications can decrease Nicotine  cravings. Zyban, Wellbutrin, or Chantix are medicines people take to help quit smoking.  You need a prescription from your Primary Care Provider.   Nicotine  Cravings Reviewed 07/26/23 Reviewed 09/06/23 Reviewed 10-15-23 Reviewed 11/19/23 Wait for craving to go away -  be patient. Chew gum. Wear Nicotine  patches. Take Medication.   Know your triggers. Reviewed 07/26/23 Reviewed 09/06/23 Reviewed 10/11/23 Reviewed 11/15/23 Common triggers At a party Boredom Stress Drinking alcohol Hanging out with friends who smoke   Dealing with Triggers  Reviewed 07/26/23 Reviewed 09/06/23 Reviewed 10/11/23 Reviewed 11/15/23 Distract  yourself, go for a walk or exercise. Avoid friends whom smoke. Pray Listen to music. Take a bath. Wait for the urge to pass. Take slow deep breaths until the urge for cigarette goes away.   Get Support Reviewed 07/26/23 Reviewed 09/06/23 Reviewed 10/11/23 Reviewed 11/15/23 Support groups Family and friends Smoking quit line, [phone] [facility]) CDC help quit smoking ([phone] [place] offers QuitSmart smoking cessation classes call [phone] to register   More information Reviewed 07/26/23 Reviewed 09/06/23 Reviewed 10/11/23 Reviewed 11/15/23 "Pathways to Freedom" by the CDC  MysteryRaffle.it quit/pathways/ "Clearing the Air" by the [employer], NIH http://www.smokefree.gove/pubs/Clearing_the_Air_508.pdf http://www.smokefree/gov/   Other    PRACTICE It is important to practice the strategies so we can determine if they will be effective in helping to reach the goal.    Follow these specific recommendations:        If strategy does not work the first time, try it again.     We may make some changes over the next few sessions.       Pablo Hurst, MSN, RN, BSN [facility], Healthy Communities RN Case Manager for Aging Gracefully Direct Dial : [phone]      09/06/23  Assessment: [name] reports she smokes 1/2 pack of cigarettes now. She was previously smoking 1.5 packs a day.  States she distracts herself with doing something else when she wants a cigarette. States she stopped taking the medication for smoking cessation because it caused nausea. Also states she has been without her atorvastatin .and spironolactone because she needs refills. States her transplant doctor told her to get her local physicians to refill the medications. Reports going to local food pantries regularly. Still drives. States she is not interested in Meals on Wheels because she rather cook her  own food. Denies any recent falls.  Interventions: Performed Aging Gracefully home exercises and provided booklet. [name] very engaged with returned demonstration. Encouraged [name] to contact PCP to request refills on medications (she called PCP office during visit to request). Encouraged [name] to schedule PCP visit as well as inform PCP about not finishing medication for smoking cessation. Praised [name] for taking initiative in reaching out to her doctors. Discussed fall precautions. Encouraged [name] to have cane accessible at all times.  Plan: Scheduled next home visit for September 17th at 10 am.   Pablo Hurst, MSN, RN, BSN [place]  [facility], Healthy Communities RN Case Manager for Aging Gracefully Direct Dial : [phone]     10/11/23  Assessment: [name] reports she has been smoking a little more than 1/2 pack of cigarettes lately. States this is an increase. Denies having any particular reason as to why she is smoking more. Denies pain or recent falls. States she did not ask her PCP for another prescription for smoking cessation medication yet. States she will ask at her next PCP appointment on 9/25. States she in interested in crocheting. Reports she plans to ask her sister for crocheting lessons. Her sister lives on the same street. [name] endorses eating nighttime snacks before bed. States her blood sugars are elevated in the morning.   Interventions: Encouraged [name]  to follow thru with crocheting lessons from her sister. Discussed a new hobby will be a great distraction from smoking. Encouraged [name] to cut back on nighttime snacking. Discussed healthier food snack options if she continues to snack. Re-sent smoking cessation and DM and nutrition EMMI programs to Ms. Deiter' email. Discussed how to access EMMI programs from her email. Encouraged [name] to contact her insurance provider to inquire about  Silver Sneakers benefit. Provided local recreation center contact information and address. Encouraged Ms. Dorion to perform Aging Gracefully exercises and to participate in Senior exercise/walking program at [facility] on Monday, Wednesday, Fridays.  Plan: Scheduled next home visit for 11/15/23 at 10 am.    11/15/23  Assessment: Ms. Redwine reports pain 0 out of 10. Reports falling once last week. States she fell going up the steps outside. Keeps cane in the car when out and about. Ms. Dickie states she goes walking at the park with her sister every week. Enjoys walking. Endorses practicing Aging Gracefully exercises while watching tv. Reports having bilateral tremors. States PCP is referring her to specialist in [place]. States planning to crochet when the weather gets cooler. States her sister hasn't shown her yet how to crochet however. States smoking less than 1/2 pack of cigarettes. States she is back on medication for smoking cessation.   Interventions: Praised Ms. Cart for walking and doing Aging Gracefully home exercises. Encouraged Ms. Ferrera to consider participating in [employer] and local senior center activities. Discussed fall precautions. Discussed smoking cessation strategies. Discussed this is writer's last home visit.   Plan: Will notify AG team of writer's last home visit.  Pablo Hurst, MSN, RN, BSN [place]  [facility], Healthy Communities RN Case Manager for Aging Gracefully Direct Dial : [phone]                                                                     If you are experiencing a Mental Health or Behavioral Health Crisis or need someone to talk to, please call the Suicide and Crisis Lifeline: 988 call the [place]  National Suicide Prevention Lifeline: 3865101363 or TTY: 803-832-4065 TTY 651-392-8076) to talk to a trained counselor call 1-800-273-TALK (toll  free, 24 hour hotline) go to [facility] [address] [place] [phone]) call 911   The patient verbalized understanding of instructions, educational materials, and care plan provided today and agreed to receive a mailed copy of patient instructions, educational materials, and care plan.   Pablo Hurst, MSN, RN, BSN [place]  [facility], Healthy Communities RN Case Manager for Aging Gracefully Direct Dial : [phone]

## 2023-11-15 NOTE — Patient Outreach (Signed)
 Aging Gracefully Program  RN Visit  11/29/2023  [name] January 21, 1951 [id number]  Visit:  RN Visit Number: 4- Fourth Visit  RN TIME CALCULATION: Start TIme:  RN Start Time Calculation: 1000 End Time:  RN Stop Time Calculation: 1040 Total Minutes:  RN Time Calculation: 40  Readiness To Change Score:  Readiness to Change Score: 9.33  Universal RN Interventions: Calendar Distribution: No Exercise Review: Yes Medications: Yes Medication Changes: Yes Mood: Yes Pain: Yes PCP Advocacy/Support: No Fall Prevention: Yes Incontinence: Yes Clinician View Of [name] Situation: Arrived for home visit. [name] ambulates without assistive device. [name] in good spirits. Home clean and neat and without clutter. Area rug at front door. [name] View Of His/Her Situation: [name] is pleased with her home modifications and repairs. Pleased with Aging Gracefully program.  Healthcare Provider Communication: Did Surveyor, mining With [employer] Provider?: No Healthcare Provider Response According to RN: n/a According to [name], Did PCP Report Communication With An Aging Gracefully RN?: No Healthcare Provider Response According To [name]: n/a  Clinician View of [name] Situation: Clinician View Of [name] Situation: Arrived for home visit. [name] ambulates without assistive device. [name] in good spirits. Home clean and neat and without clutter. Area rug at front door. [name]'s View of His/Her Situation: [name] View Of His/Her Situation: [name] is pleased with her home modifications and repairs. Pleased with Aging Gracefully program.  Medication Assessment: Reviewed    OT Update: Pending CHS completion.  Session Summary: [name] is in good spirits today. She is very pleased with [facility]' home modifications and repairs. She is doing well overall.   Goals Addressed               This Visit's Progress     AG RN (pt-stated)         07/26/23  Assessment: [name] denies pain. Reports she has had multiple falls when getting out of the shower/tub in the bathroom. States she looks forward to [facility] home modifications. [name] reports being independent and still drives. States she has a cane but does not use it. Endorses smoking 1.5 packs of cigarettes a day. States she wants to quit. Reports being s/p kidney transplant in January 2024. Goes to [facility] hospital system for follow up.   Interventions: Provided chronic disease management booklet, writer's contact information, calendar book, and DM booklet. Discussed fall precautions. Encouraged [name] to use cane more and to at least have it nearby.  Encouraged [name] to carry cell phone on her at all times since she does not have a medical alert system. Discussed strategies for smoking cessation. Discussed EMMI videos/articles for smoking cessation. Verified [name]' email address.  Plan: Scheduled next home visit for August 13th at 10 am. Send EMMI smoking cessation videos/articles via email.   [name]/RN ACTION PLAN - STOP SMOKING  Registered Nurse:  Pablo Hurst  Date: 07/26/2023  [name] Name: [name]  [name] ID:    Target Area:  STOP SMOKING    What does [name] want to be able to do? To stop smoking    Years smoking: 20 + years   Packs per day:  1.5   STRATEGIES Ideas Strategies  Why Quit? Reviewed 07/26/23 Reviewed 09/06/23 Reviewed 2023/10/25 Reviewed 29-Nov-2023 Save money Feel better You and your family will be more healthy. Set a good example for your children and grandchildren.   Risks to You Reviewed 07/26/23 Reviewed 09/06/23 Reviewed 10/25/2023 Reviewed 29-Nov-2023 Death Cancer:  lung, colon,  bladder, mouth, stomach and pancreas Arthritis Asthma, couch and Chronic Obstructive Pulmonary Disease Heart Disease including high blood pressure  Risks to Your Family Reviewed 07/26/23 Reviewed 09/06/23 Reviewed 10/11/23 Reviewed 11/15/23  Cancer Asthma, allergies and pneumonia Heart Disease Ear Infections   Be Patient Yourself Reviewed 07/26/23 Reviewed 09/06/23 Reviewed 10/11/23 Reviewed 11/15/23 Many people try to quit several times before they are successful. If you slip and do smoke, don't get mad at yourself, try to stop again the next day.    How to Quit Smoking Reviewed 07/26/23 Reviewed 09/06/23 Reviewed 10/11/23 Reviewed 11/15/23 Many people who quit use a lot of different tools at the same time: Support group Medications Nicotine  patches or Nicotine  gum Support from family and friends    Set a Quit Date Reviewed 07/26/23 Reviewed 09/06/23 Reviewed 10/11/23 Reviewed 11/15/23 A quit date gives you a goal to reach. It is a promise to yourself and your lived ones that you will quit. If you decide today that you want to quit, stet your quit date 2-3 weeks from now.     Medications Reviewed 07/26/23 Reviewed 09/06/23 Reviewed 10/11/23 Reviewed 11/15/23 Medications can decrease Nicotine  cravings. Zyban, Wellbutrin, or Chantix are medicines people take to help quit smoking.  You need a prescription from your Primary Care Provider.   Nicotine  Cravings Reviewed 07/26/23 Reviewed 09/06/23 Reviewed 10/11/23 Reviewed 11/15/23 Wait for craving to go away - be patient. Chew gum. Wear Nicotine  patches. Take Medication.   Know your triggers. Reviewed 07/26/23 Reviewed 09/06/23 Reviewed 10/11/23 Reviewed 11/15/23 Common triggers At a party Boredom Stress Drinking alcohol Hanging out with friends who smoke   Dealing with Triggers  Reviewed 07/26/23 Reviewed 09/06/23 Reviewed 10/11/23 Reviewed 11/15/23 Distract yourself, go for a walk or exercise. Avoid friends whom smoke. Pray Listen to music. Take a bath. Wait for the urge to pass. Take slow deep breaths until the urge for cigarette goes away.   Get Support Reviewed 07/26/23 Reviewed 09/06/23 Reviewed 10/11/23 Reviewed 11/15/23 Support groups Family and  friends Smoking quit line, [phone] [facility]) CDC help quit smoking [phone] [place] offers QuitSmart smoking cessation classes call [phone] to register   More information Reviewed 07/26/23 Reviewed 09/06/23 Reviewed 10/11/23 Reviewed 11/15/23 "Pathways to Freedom" by the CDC  MysteryRaffle.it quit/pathways/ "Clearing the Air" by the [employer], NIH http://www.smokefree.gove/pubs/Clearing_the_Air_508.pdf http://www.smokefree/gov/   Other    PRACTICE It is important to practice the strategies so we can determine if they will be effective in helping to reach the goal.    Follow these specific recommendations:        If strategy does not work the first time, try it again.     We may make some changes over the next few sessions.       Pablo Hurst, MSN, RN, BSN [facility], Healthy Communities RN Case Manager for Aging Gracefully Direct Dial : [phone]      09/06/23  Assessment: Ms. Emami reports she smokes 1/2 pack of cigarettes now. She was previously smoking 1.5 packs a day.  States she distracts herself with doing something else when she wants a cigarette. States she stopped taking the medication for smoking cessation because it caused nausea. Also states she has been without her atorvastatin .and spironolactone because she needs refills. States her transplant doctor told her to get her local physicians to refill the medications. Reports going to local food pantries regularly. Still drives. States she is not interested in Meals on Wheels because she rather cook her  own food. Denies any recent falls.  Interventions: Performed Aging Gracefully home exercises and provided booklet. Ms. Petre very engaged with returned demonstration. Encouraged Ms. Stoneham to contact PCP to request refills on medications (she called PCP office during visit to request). Encouraged Ms.  Dorin to schedule PCP visit as well as inform PCP about not finishing medication for smoking cessation. Praised Ms. Considine for taking initiative in reaching out to her doctors. Discussed fall precautions. Encouraged Ms. Carder to have cane accessible at all times.  Plan: Scheduled next home visit for September 17th at 10 am.   Pablo Hurst, MSN, RN, BSN [place]  [facility], Healthy Communities RN Case Manager for Aging Gracefully Direct Dial : [phone]     10/11/23  Assessment: Ms. Eppolito reports she has been smoking a little more than 1/2 pack of cigarettes lately. States this is an increase. Denies having any particular reason as to why she is smoking more. Denies pain or recent falls. States she did not ask her PCP for another prescription for smoking cessation medication yet. States she will ask at her next PCP appointment on 9/25. States she in interested in crocheting. Reports she plans to ask her sister for crocheting lessons. Her sister lives on the same street. Ms. Soliz endorses eating nighttime snacks before bed. States her blood sugars are elevated in the morning.   Interventions: Encouraged Ms. Riffe to follow thru with crocheting lessons from her sister. Discussed a new hobby will be a great distraction from smoking. Encouraged Ms. Grzesiak to cut back on nighttime snacking. Discussed healthier food snack options if she continues to snack. Re-sent smoking cessation and DM and nutrition EMMI programs to Ms. Virden' email. Discussed how to access EMMI programs from her email. Encouraged Ms. Stotz to contact her insurance provider to inquire about Silver Sneakers benefit. Provided local recreation center contact information and address. Encouraged Ms. Sircy to perform Aging Gracefully exercises and to participate in Senior exercise/walking program at [facility] on Monday, Wednesday, Fridays.  Plan: Scheduled next home visit for 11/15/23 at 10  am.    11/15/23  Assessment: Ms. Frison reports pain 0 out of 10. Reports falling once last week. States she fell going up the steps outside. Keeps cane in the car when out and about. Ms. Mcclaine states she goes walking at the park with her sister every week. Enjoys walking. Endorses practicing Aging Gracefully exercises while watching tv. Reports having bilateral tremors. States PCP is referring her to specialist in [place]. States planning to crochet when the weather gets cooler. States her sister hasn't shown her yet how to crochet however. States smoking less than 1/2 pack of cigarettes. States she is back on medication for smoking cessation.   Interventions: Praised Ms. Rawlins for walking and doing Aging Gracefully home exercises. Encouraged Ms. Pember to consider participating in [employer] and local senior center activities. Discussed fall precautions. Discussed smoking cessation strategies. Discussed this is writer's last home visit.   Plan: Will notify AG team of writer's last home visit.  Pablo Hurst, MSN, RN, BSN [place]  [facility], Healthy Communities RN Case Manager for Aging Gracefully Direct Dial : [phone]  Pablo Hurst, MSN, RN, BSN [place]  [facility], Healthy Communities RN Case Manager for Aging Gracefully Direct Dial : [phone]

## 2023-11-20 ENCOUNTER — Encounter: Payer: Self-pay | Admitting: Endocrinology

## 2023-11-20 ENCOUNTER — Ambulatory Visit (INDEPENDENT_AMBULATORY_CARE_PROVIDER_SITE_OTHER): Admitting: Endocrinology

## 2023-11-20 ENCOUNTER — Other Ambulatory Visit

## 2023-11-20 VITALS — BP 122/84 | HR 102 | Ht 62.0 in | Wt 116.0 lb

## 2023-11-20 DIAGNOSIS — E1165 Type 2 diabetes mellitus with hyperglycemia: Secondary | ICD-10-CM | POA: Diagnosis not present

## 2023-11-20 DIAGNOSIS — R251 Tremor, unspecified: Secondary | ICD-10-CM | POA: Diagnosis not present

## 2023-11-20 DIAGNOSIS — Z794 Long term (current) use of insulin: Secondary | ICD-10-CM

## 2023-11-20 DIAGNOSIS — Z1329 Encounter for screening for other suspected endocrine disorder: Secondary | ICD-10-CM | POA: Diagnosis not present

## 2023-11-20 LAB — T3, FREE: T3, Free: 2.5 pg/mL (ref 2.3–4.2)

## 2023-11-20 LAB — TSH: TSH: 0.51 m[IU]/L (ref 0.40–4.50)

## 2023-11-20 LAB — T4, FREE: Free T4: 1.4 ng/dL (ref 0.8–1.8)

## 2023-11-20 NOTE — Progress Notes (Signed)
 Outpatient Endocrinology Note Jora Galluzzo, MD  11/20/23  Patient's Name: [name]    DOB: [birth year]    MRN: [id number]                                                    REASON OF VISIT: Follow up for type 2 diabetes mellitus  PCP: Latisha Ronal Crank, PA-C  HISTORY OF PRESENT ILLNESS:   [name] is a 73 y.o. old female with past medical history listed below, is here for follow up of type 2 diabetes mellitus.   Pertinent Diabetes History: Patient was diagnosed with type 2 diabetes mellitus in 2007.  Patient had transplant in January 2024 in [place] .  Patient used to be on oral antidiabetic medication in the past.  She is currently on basal bolus insulin  regimen.  Chronic Diabetes Complications : Retinopathy: yes, no records available to review.  Last ophthalmology exam was done on annually, reportedly. 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 Peripheral neuropathy: no Coronary artery disease: no Stroke: no  Relevant comorbidities and cardiovascular risk factors: Obesity: no Body mass index is 21.22 kg/m.  Hypertension: yes Hyperlipidemia. yes  Current / Home Diabetic regimen includes: Tresiba  8 units in the morning. Humalog  3 - 5 units with meals 2-3 times a day. Jardiance  10 mg daily.  Prior diabetic medications: Metformin , repaglinide , Actos.  Glycemic data:    CONTINUOUS GLUCOSE MONITORING SYSTEM (CGMS) INTERPRETATION: At today's visit, we reviewed CGM downloads. The full report is scanned in the media. Reviewing the CGM trends, blood glucose are as follows:  Dexcom G7 CGM-  Sensor Download (Sensor download was reviewed and summarized below.) Dates: October 14 to November 20, 2023 , 14 days  Glucose Management Indicator: 7.0%      Interpretation: Mostly acceptable blood sugar with occasional mild hyperglycemia with blood sugar up to low 200 range and rarely up to 250 range  postprandially related to high carb meal.  Blood sugar in between the meals and overnight acceptable.  No hypoglycemia.  Hypoglycemia: Patient has no hypoglycemic episodes. Patient has hypoglycemia awareness.   Factors modifying glucose control: 1.  Diabetic diet assessment: Eating 3 meals a day and sometimes eats Diet Coke and donut.  Bedtime snacks.  2.  Staying active or exercising: No formal exercise.  3.  Medication compliance: compliant all of the time.  Interval history Dexcom G7 CGM data as reviewed above.  Mostly acceptable blood sugar.  GMI on CGM 7%.  Diabetes regimen as reviewed and noted above.  She has occasional hyperglycemia and taking additional mealtime insulin .  Also reviewed glucose log.  She has complaints of hand tremors noticed recently.  Denies palpitation and heat intolerance.  She has noticed some increased sweating.  No neck discomfort.  No other complaints today.  She has normal thyroid  function test with TSH normal of 1.83 in August 2021.  REVIEW OF SYSTEMS As per history of present illness.   PAST MEDICAL HISTORY: Past Medical History:  Diagnosis Date   Arthritis    Chronic back pain    Chronic kidney disease (CKD)    Constipation    COPD (chronic obstructive pulmonary disease) (HCC)    Cough    Diabetes mellitus, type 2 (HCC)    Diverticulosis    Fibroid    patient thinks  this was the reason for her hysterectomy   GERD (gastroesophageal reflux disease)    Heart murmur    History of sebaceous cyst    Hyperlipemia    Hyperplastic colon polyp    Hypertension    Hyperthyroidism    Lumbar radiculopathy    Shortness of breath 09/13/2013   Stroke (HCC) 2015?   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    PAST SURGICAL HISTORY: Past Surgical History:  Procedure Laterality Date   ABDOMINAL HYSTERECTOMY     --?ovaries remain   AV FISTULA PLACEMENT Left 07/16/2020   Procedure: LEFT  BRACHIOCEPHALIC ARTERIOVENOUS (AV) FISTULA CREATION;  Surgeon: Magda Debby SAILOR, MD;  Location: [facility] OR;  Service: Vascular;  Laterality: Left;  PERIPHERAL NERVE BLOCK   COLONOSCOPY W/ BIOPSIES  09/11/2008   ESOPHAGOGASTRODUODENOSCOPY (EGD) WITH PROPOFOL  N/A 05/20/2020   Procedure: ESOPHAGOGASTRODUODENOSCOPY (EGD) WITH PROPOFOL ;  Surgeon: Abran Norleen SAILOR, MD;  Location: [facility] ENDOSCOPY;  Service: Endoscopy;  Laterality: N/A;   GIVENS CAPSULE STUDY N/A 06/28/2020   Procedure: GIVENS CAPSULE STUDY;  Surgeon: Abran Norleen SAILOR, MD;  Location: [facility] ENDOSCOPY;  Service: Endoscopy;  Laterality: N/A;   RETINOPATHY SURGERY Bilateral 2013   Dr. alvia    ALLERGIES: Allergies  Allergen Reactions   Tape Other (See Comments)    Paper tape rips skin.    Semaglutide  Rash    Rybelsus  3 mg    FAMILY HISTORY:  Family History  Problem Relation Age of Onset   Hypertension Mother    Sleep apnea Mother    Diabetes Mother    Hyperlipidemia Mother    Lung cancer Father        smoker   Hypertension Sister    Diabetes Sister    Hypertension Sister    Hypertension Brother    Diabetes Brother    Hypertension Brother    Breast cancer Neg Hx    Colon cancer Neg Hx    Esophageal cancer Neg Hx    Pancreatic cancer Neg Hx    Liver disease Neg Hx    Colon polyps Neg Hx    BRCA 1/2 Neg Hx     SOCIAL HISTORY: Social History   Socioeconomic History   Marital status: Divorced    Spouse name: Not on file   Number of children: 1   Years of education: 11   Highest education level: Not on file  Occupational History   Occupation: Engineer, Drilling: [employer]    Comment: Retired  Tobacco Use   Smoking status: Some Days    Current packs/day: 0.00    Average packs/day: 0.5 packs/day for 35.0 years (17.5 ttl pk-yrs)    Types: Cigarettes    Start date: 07/07/1979    Last attempt to quit: 07/07/2014    Years since quitting: 9.3   Smokeless tobacco: Never   Tobacco comments:    Smoking some again     02/12/2022 patient smokes 1 -2 some days  Vaping Use   Vaping status: Never Used  Substance and Sexual Activity   Alcohol use: No   Drug use: No   Sexual activity: Never    Partners: Male    Birth control/protection: Surgical    Comment: TAH--?ovaries remain  Other Topics Concern   Not on file  Social History Narrative   Divorced 1 son    Retired Administrator, Sports for [employer]   Former smoker no  alcohol caffeine or drug use report denies abuse and feels safe at home.   Social Drivers of Health   Financial Resource Strain: Medium Risk (08/23/2019)   Overall Financial Resource Strain (CARDIA)    Difficulty of Paying Living Expenses: Somewhat hard  Food Insecurity: Low Risk  (06/08/2022)   Received from [facility]   Hunger Vital Sign    Within the past 12 months, you worried that your food would run out before you got money to buy more: Never true    Within the past 12 months, the food you bought just didn't last and you didn't have money to get more. : Never true  Transportation Needs: Not on file (06/08/2022)  Physical Activity: Sufficiently Active (08/23/2019)   Exercise Vital Sign    Days of Exercise per Week: 5 days    Minutes of Exercise per Session: 30 min  Stress: No Stress Concern Present (08/23/2019)   [employer] of Occupational Health - Occupational Stress Questionnaire    Feeling of Stress : Not at all  Social Connections: Moderately Integrated (07/07/2017)   Social Connection and Isolation Panel    Frequency of Communication with Friends and Family: More than three times a week    Frequency of Social Gatherings with Friends and Family: More than three times a week    Attends Religious Services: More than 4 times per year    Active Member of [employer] or Organizations: Yes    Attends Engineer, Structural: More than 4 times per year    Marital Status: Divorced    MEDICATIONS:  Current Outpatient Medications  Medication Sig Dispense Refill    atorvastatin  (LIPITOR ) 40 MG tablet Take 40 mg by mouth at bedtime.     blood glucose meter kit and supplies KIT Dispense based on patient and insurance preference. Use up to four times daily as directed. 1 each 0   Blood Glucose Monitoring Suppl (ONETOUCH VERIO FLEX SYSTEM) w/Device KIT 1 each by Does not apply route 2 (two) times daily. E11.9     Continuous Blood Gluc Receiver (DEXCOM G7 RECEIVER) DEVI Used to monitor blood sugar. Replace once per year.     Continuous Glucose Sensor (DEXCOM G7 SENSOR) MISC Change sensor every 10 days as directed by manufacturer 6 each 6   glucose blood (ONETOUCH VERIO) test strip USE AS DIRECTED TWICE DAILY 200 strip 3   insulin  degludec (TRESIBA  FLEXTOUCH) 100 UNIT/ML FlexTouch Pen Inject 8 Units into the skin daily. 15 mL 2   insulin  lispro (HUMALOG ) 100 UNIT/ML KwikPen 3 to 4 units before each meal 15 mL 3   Insulin  Pen Needle 32G X 4 MM MISC Use 4x a day 300 each 3   JARDIANCE  10 MG TABS tablet Take 1 tablet (10 mg total) by mouth in the morning. 90 tablet 3   metoprolol  succinate (TOPROL -XL) 100 MG 24 hr tablet Take 100 mg by mouth in the morning.     Multiple Vitamin (MULTIVITAMIN WITH MINERALS) TABS tablet Take 1 tablet by mouth in the morning.     mycophenolate (CELLCEPT) 250 MG capsule Take 250 mg by mouth 2 (two) times daily.     NIFEdipine (PROCARDIA XL/NIFEDICAL XL) 60 MG 24 hr tablet Take 60 mg by mouth 2 (two) times daily.     OneTouch Delica Lancets 33G MISC 1 each by Does not apply route 2 (two) times daily. E11.9     pantoprazole  (PROTONIX ) 40 MG tablet Take 1 tablet (40 mg total) by mouth daily.  90 tablet 3   polyethylene glycol (MIRALAX  / GLYCOLAX ) 17 g packet Take 17 g by mouth daily as needed (constipation.).     predniSONE  (DELTASONE ) 5 MG tablet Take 5 mg by mouth in the morning.     spironolactone (ALDACTONE) 50 MG tablet Take 50 mg by mouth 2 (two) times daily.     tacrolimus  (PROGRAF ) 1 MG capsule Take 3 mg by mouth in the morning and  at bedtime.     Varenicline Tartrate, Starter, 0.5 MG X 11 & 1 MG X 42 TBPK Take 0.5-1 mg by mouth as directed. TAKE 1 TABLET DAILY ON DAYS 1-3 AND 1 TABLET TWICE DAILY ON DAYS 4-7. THEN 1 TAB TWICE DAIL     No current facility-administered medications for this visit.    PHYSICAL EXAM: Vitals:   11/20/23 1402  BP: 122/84  Pulse: (!) 102  SpO2: 99%  Weight: 116 lb (52.6 kg)  Height: 5' 2 (1.575 m)    Body mass index is 21.22 kg/m.  Wt Readings from Last 3 Encounters:  11/20/23 116 lb (52.6 kg)  09/14/23 112 lb 9.6 oz (51.1 kg)  05/30/23 117 lb 6.4 oz (53.3 kg)    General: Well developed, well nourished female in no apparent distress.  HEENT: AT/[place], no external lesions.  Eyes: Conjunctiva clear and no icterus. Neck: Neck supple  Lungs: Respirations not labored Neurologic: Alert, oriented, normal speech.  DTR 2+ normal. Extremities / Skin: Dry.  Bilateral hand tremors + Psychiatric: Does not appear depressed or anxious  Diabetic Foot Exam - Simple   No data filed    LABS Reviewed Lab Results  Component Value Date   HGBA1C 6.6 (A) 09/14/2023   HGBA1C 6.9 (A) 05/30/2023   HGBA1C 7.0 (A) 09/09/2022   Lab Results  Component Value Date   FRUCTOSAMINE 363 (H) 04/27/2022   FRUCTOSAMINE 315 (H) 01/28/2022   FRUCTOSAMINE 294 (H) 09/29/2021   Lab Results  Component Value Date   CHOL 155 07/11/2018   HDL 44.70 07/11/2018   LDLCALC 80 07/11/2018   LDLDIRECT 69.0 09/29/2021   TRIG 151.0 (H) 07/11/2018   CHOLHDL 3 07/11/2018   Lab Results  Component Value Date   MICRALBCREAT 679.6 (H) 04/09/2008   Lab Results  Component Value Date   CREATININE 0.92 08/28/2023   Lab Results  Component Value Date   GFR 25.74 (L) 07/11/2018    ASSESSMENT / PLAN  1. Uncontrolled type 2 diabetes mellitus with hyperglycemia, with long-term current use of insulin  (HCC)   2. Tremor of both hands   3. Screening for thyroid  disorder     Diabetes Mellitus type 2, complicated by  diabetic nephropathy, ESRD s/p renal transplant - Diabetic status / severity: fair controlled.  Lab Results  Component Value Date   HGBA1C 6.6 (A) 09/14/2023    - Hemoglobin A1c goal : <6.5% - Medications: See below.  No change.  I) continue Tresiba  8 units daily in the morning. II) continue Humalog  3 -4  units with meals 3 times a day.  Advised to take Humalog  up to 5 units for large meals.   III) continue Jardiance  10 mg daily.  - Home glucose testing: Dexcom G7 and check as needed. - Discussed/ Gave Hypoglycemia treatment plan.  # Consult : not required at this time.   # She is status post renal transplant, following with nephrology and transplant team.  Patient reports she had a urine test with nephrology and is being monitored.  On Jardiance . Last  Lab Results  Component Value Date   MICRALBCREAT 679.6 (H) 04/09/2008    # Foot check nightly.  # She has diabetic retinopathy, following with ophthalmology.  No detail records available to review.  - Diet: Eat reasonable portion sizes to promote a healthy weight - Life style / activity / exercise: Discussed.  2. Blood pressure  -  BP Readings from Last 1 Encounters:  11/20/23 122/84    - Control is in target.  - No change in current plans.  3. Lipid status / Hyperlipidemia - Last  Lab Results  Component Value Date   LDLCALC 80 07/11/2018   - Continue atorvastatin  40 mg daily.  Managed by primary care provider.  # Hand tremor - Check thyroid  function test.  Provide her thyroid  function test is normal.  Asked patient to discuss with primary care provider as well for evaluation of hand tremors.  Discussed that sometimes it can be essential tremors.   [name] was seen today for follow-up.  Diagnoses and all orders for this visit:  Uncontrolled type 2 diabetes mellitus with hyperglycemia, with long-term current use of insulin  (HCC)  Tremor of both hands -     T4, free -     T3, free -     TSH  Screening for  thyroid  disorder -     T4, free -     T3, free -     TSH   DISPOSITION Follow up in clinic in 4  months suggested.   All questions answered and patient verbalized understanding of the plan.  Deneshia Zucker, MD [facility] Endocrinology [facility] [address] [place], [address] [place], [place] [address] Phone # [phone]  At least part of this note was generated using voice recognition software. Inadvertent word errors may have occurred, which were not recognized during the proofreading process.

## 2023-11-21 ENCOUNTER — Ambulatory Visit: Attending: Internal Medicine | Admitting: Internal Medicine

## 2023-11-21 ENCOUNTER — Encounter: Payer: Self-pay | Admitting: Internal Medicine

## 2023-11-21 ENCOUNTER — Ambulatory Visit: Payer: Self-pay | Admitting: Endocrinology

## 2023-11-21 VITALS — BP 110/76 | HR 86 | Resp 16 | Ht 62.0 in | Wt 117.7 lb

## 2023-11-21 DIAGNOSIS — R9431 Abnormal electrocardiogram [ECG] [EKG]: Secondary | ICD-10-CM

## 2023-11-21 DIAGNOSIS — I517 Cardiomegaly: Secondary | ICD-10-CM

## 2023-11-21 DIAGNOSIS — I350 Nonrheumatic aortic (valve) stenosis: Secondary | ICD-10-CM

## 2023-11-21 DIAGNOSIS — I5033 Acute on chronic diastolic (congestive) heart failure: Secondary | ICD-10-CM

## 2023-11-21 MED ORDER — SPIRONOLACTONE 50 MG PO TABS: 50.0000 mg | ORAL_TABLET | Freq: Two times a day (BID) | ORAL | 3 refills | Status: AC

## 2023-11-21 NOTE — Patient Instructions (Signed)
 Medication Instructions:  NO CHANGES  *If you need a refill on your cardiac medications before your next appointment, please call your pharmacy*  Lab Work: Lab Work today -- 1st Floor  If you have labs (blood work) drawn today and your tests are completely normal, you will receive your results only by: [employer] (if you have MyChart) OR A paper copy in the mail If you have any lab test that is abnormal or we need to change your treatment, we will call you to review the results.  Testing/Procedures: Your physician has requested that you have an echocardiogram. Echocardiography is a painless test that uses sound waves to create images of your heart. It provides your doctor with information about the size and shape of your heart and how well your heart's chambers and valves are working. This procedure takes approximately one hour. There are no restrictions for this procedure. Please do NOT wear cologne, perfume, aftershave, or lotions (deodorant is allowed). Please arrive 15 minutes prior to your appointment time.  Please note: We ask at that you not bring children with you during ultrasound (echo/ vascular) testing. Due to room size and safety concerns, children are not allowed in the ultrasound rooms during exams. Our front office staff cannot provide observation of children in our lobby area while testing is being conducted. An adult accompanying a patient to their appointment will only be allowed in the ultrasound room at the discretion of the ultrasound technician under special circumstances. We apologize for any inconvenience.  Myocardial Amyloid - stress test  Follow-Up: At [facility], you and your health needs are our priority.  As part of our continuing mission to provide you with exceptional heart care, our providers are all part of one team.  This team includes your primary Cardiologist (physician) and Advanced Practice Providers or APPs (Physician Assistants and Nurse  Practitioners) who all work together to provide you with the care you need, when you need it.  Your next appointment:    After Testing -- with Dr. Mona  We recommend signing up for the patient portal called MyChart.  Sign up information is provided on this After Visit Summary.  MyChart is used to connect with patients for Virtual Visits (Telemedicine).  Patients are able to view lab/test results, encounter notes, upcoming appointments, etc.  Non-urgent messages can be sent to your provider as well.   To learn more about what you can do with MyChart, go to [email or web address].

## 2023-11-21 NOTE — Progress Notes (Signed)
 OFFICE NOTE  Chief Complaint:  Follow-up heart failure  Primary Care Physician: Latisha Ronal Crank, PA-C  HPI:  [name] is a 73 y.o. female with a past medial history significant for type II diabetes melitis, hypertension, CVA, acute on chronic diastolic CHF, GERD, CKD stage IV, tobacco abuse, HLD, systolic murmur, vitamin D  deficiency, and anemia secondary to GI bleed.  She was hospitalized April 25-29 and then May 11-18 with diagnosis of symptomatic anemia possibly related to GI bleeding and again with CHF.  She underwent EGD and colonoscopy which showed no source of bleeding.  She returned to the emergency department 06/27/2020 with shortness of breath, weakness, and hemoglobin of 5.2.  She had a hemoglobin of 8.9 at discharge on May 18.  She denied melena and bright red rectal bleeding.  She reported having taken aspirin  325 daily and Aleve 2 days prior to her admission.  She received 2 units of PRBCs.  She underwent capsule endoscopy which was pending at the time of her discharge.  Her aspirin  and NSAID were held.  She was not noted to have bleeding during her hospital stay.  She remained stable and was discharged 06/29/2020.  11/23/2020  She was seen out of the hospital by Josefa Beauvais, NP for diastolic heart failure.  A repeat echo showed that the LVEF was 70 to 75% with severe LV wall thickening and atrial enlargement.  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  She is on numerous medications and it appears that her blood pressure is now finally better controlled at 128/60.  She is progressed to dialysis and is using a dialysis catheter.  She does have a left arm fistula which was recently revised and hopefully will begin using soon.  She denies any worsening shortness of breath or chest pain.  11/21/2023  [name] returns today for follow-up.  Recently she was seen by Josefa Beauvais again for follow-up.  Echo has showed severe LV wall thickening which was  concerning for hypertensive cardiomyopathy however amyloidosis could not be ruled out.  She has not had a repeat echo since 2022.  She continues to do well with renal transplant and is followed at [facility] for this.  She was noted to have some mild aortic stenosis.  She denies any carpal tunnel symptoms.  Blood pressure today was 110/76.  Her diabetes control has improved somewhat with recent A1c of 6.6%.  She is followed by endocrinology.  PMHx:  Past Medical History:  Diagnosis Date   Arthritis    Chronic back pain    Chronic kidney disease (CKD)    Constipation    COPD (chronic obstructive pulmonary disease) (HCC)    Cough    Diabetes mellitus, type 2 (HCC)    Diverticulosis    Fibroid    patient thinks this was the reason for her hysterectomy   GERD (gastroesophageal reflux disease)    Heart murmur    History of sebaceous cyst    Hyperlipemia    Hyperplastic colon polyp    Hypertension    Hyperthyroidism    Lumbar radiculopathy    Shortness of breath 09/13/2013   Stroke (HCC) 2015?   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    Past Surgical History:  Procedure Laterality Date   ABDOMINAL HYSTERECTOMY     --?ovaries remain   AV FISTULA PLACEMENT Left 07/16/2020   Procedure: LEFT BRACHIOCEPHALIC ARTERIOVENOUS (AV)  FISTULA CREATION;  Surgeon: Magda Debby SAILOR, MD;  Location: [facility] OR;  Service: Vascular;  Laterality: Left;  PERIPHERAL NERVE BLOCK   COLONOSCOPY W/ BIOPSIES  09/11/2008   ESOPHAGOGASTRODUODENOSCOPY (EGD) WITH PROPOFOL  N/A 05/20/2020   Procedure: ESOPHAGOGASTRODUODENOSCOPY (EGD) WITH PROPOFOL ;  Surgeon: Abran Norleen SAILOR, MD;  Location: [facility] ENDOSCOPY;  Service: Endoscopy;  Laterality: N/A;   GIVENS CAPSULE STUDY N/A 06/28/2020   Procedure: GIVENS CAPSULE STUDY;  Surgeon: Abran Norleen SAILOR, MD;  Location: [facility] ENDOSCOPY;  Service: Endoscopy;  Laterality: N/A;   RETINOPATHY SURGERY Bilateral 2013   Dr. alvia    FAMHx:  Family  History  Problem Relation Age of Onset   Hypertension Mother    Sleep apnea Mother    Diabetes Mother    Hyperlipidemia Mother    Lung cancer Father        smoker   Hypertension Sister    Diabetes Sister    Hypertension Sister    Hypertension Brother    Diabetes Brother    Hypertension Brother    Breast cancer Neg Hx    Colon cancer Neg Hx    Esophageal cancer Neg Hx    Pancreatic cancer Neg Hx    Liver disease Neg Hx    Colon polyps Neg Hx    BRCA 1/2 Neg Hx     SOCHx:   reports that she has been smoking cigarettes. She started smoking about 44 years ago. She has a 17.5 pack-year smoking history. She has never used smokeless tobacco. She reports that she does not drink alcohol and does not use drugs.  ALLERGIES:  Allergies  Allergen Reactions   Tape Other (See Comments)    Paper tape rips skin.    Semaglutide  Rash    Rybelsus  3 mg    ROS: Pertinent items noted in HPI and remainder of comprehensive ROS otherwise negative.  HOME MEDS: Current Outpatient Medications on File Prior to Visit  Medication Sig Dispense Refill   atorvastatin  (LIPITOR ) 40 MG tablet Take 40 mg by mouth at bedtime.     blood glucose meter kit and supplies KIT Dispense based on patient and insurance preference. Use up to four times daily as directed. 1 each 0   Blood Glucose Monitoring Suppl (ONETOUCH VERIO FLEX SYSTEM) w/Device KIT 1 each by Does not apply route 2 (two) times daily. E11.9     Continuous Blood Gluc Receiver (DEXCOM G7 RECEIVER) DEVI Used to monitor blood sugar. Replace once per year.     Continuous Glucose Sensor (DEXCOM G7 SENSOR) MISC Change sensor every 10 days as directed by manufacturer 6 each 6   glucose blood (ONETOUCH VERIO) test strip USE AS DIRECTED TWICE DAILY 200 strip 3   insulin  degludec (TRESIBA  FLEXTOUCH) 100 UNIT/ML FlexTouch Pen Inject 8 Units into the skin daily. 15 mL 2   insulin  lispro (HUMALOG ) 100 UNIT/ML KwikPen 3 to 4 units before each meal 15 mL 3    Insulin  Pen Needle 32G X 4 MM MISC Use 4x a day 300 each 3   JARDIANCE  10 MG TABS tablet Take 1 tablet (10 mg total) by mouth in the morning. 90 tablet 3   metoprolol  succinate (TOPROL -XL) 100 MG 24 hr tablet Take 100 mg by mouth in the morning.     Multiple Vitamin (MULTIVITAMIN WITH MINERALS) TABS tablet Take 1 tablet by mouth in the morning.     mycophenolate (CELLCEPT) 250 MG capsule Take 250 mg by mouth 2 (two) times daily.     NIFEdipine (PROCARDIA  XL/NIFEDICAL XL) 60 MG 24 hr tablet Take 60 mg by mouth 2 (two) times daily.     OneTouch Delica Lancets 33G MISC 1 each by Does not apply route 2 (two) times daily. E11.9     pantoprazole  (PROTONIX ) 40 MG tablet Take 1 tablet (40 mg total) by mouth daily. 90 tablet 3   polyethylene glycol (MIRALAX  / GLYCOLAX ) 17 g packet Take 17 g by mouth daily as needed (constipation.).     predniSONE  (DELTASONE ) 5 MG tablet Take 5 mg by mouth in the morning.     spironolactone (ALDACTONE) 50 MG tablet Take 50 mg by mouth 2 (two) times daily.     tacrolimus  (PROGRAF ) 1 MG capsule Take 3 mg by mouth in the morning and at bedtime.     Varenicline Tartrate, Starter, 0.5 MG X 11 & 1 MG X 42 TBPK Take 0.5-1 mg by mouth as directed. TAKE 1 TABLET DAILY ON DAYS 1-3 AND 1 TABLET TWICE DAILY ON DAYS 4-7. THEN 1 TAB TWICE DAIL     No current facility-administered medications on file prior to visit.    LABS/IMAGING: Results for orders placed or performed in visit on 11/20/23 (from the past 48 hours)  T4, free     Status: None   Collection Time: 11/20/23  2:39 PM  Result Value Ref Range   Free T4 1.4 0.8 - 1.8 ng/dL  T3, free     Status: None   Collection Time: 11/20/23  2:39 PM  Result Value Ref Range   T3, Free 2.5 2.3 - 4.2 pg/mL  TSH     Status: None   Collection Time: 11/20/23  2:39 PM  Result Value Ref Range   TSH 0.51 0.40 - 4.50 mIU/L   No results found.  LIPID PANEL:    Component Value Date/Time   CHOL 155 07/11/2018 1258   TRIG 151.0 (H)  07/11/2018 1258   HDL 44.70 07/11/2018 1258   CHOLHDL 3 07/11/2018 1258   VLDL 30.2 07/11/2018 1258   LDLCALC 80 07/11/2018 1258   LDLDIRECT 69.0 09/29/2021 1534     WEIGHTS: Wt Readings from Last 3 Encounters:  11/21/23 117 lb 11.2 oz (53.4 kg)  11/20/23 116 lb (52.6 kg)  09/14/23 112 lb 9.6 oz (51.1 kg)    VITALS: BP 110/76 (BP Location: Right Arm, Patient Position: Sitting, Cuff Size: Normal)   Pulse 86   Resp 16   Ht 5' 2 (1.575 m)   Wt 117 lb 11.2 oz (53.4 kg)   SpO2 98%   BMI 21.53 kg/m   EXAM: General appearance: alert and no distress Neck: no carotid bruit, no JVD, and thyroid  not enlarged, symmetric, no tenderness/mass/nodules Lungs: clear to auscultation bilaterally Heart: regular rate and rhythm Abdomen: soft, non-tender; bowel sounds normal; no masses,  no organomegaly Extremities: extremities normal, atraumatic, no cyanosis or edema and left upper extremity fistula with positive thrill Pulses: 2+ and symmetric Skin: Skin color, texture, turgor normal. No rashes or lesions Neurologic: Grossly normal Psych: Pleasant  EKG: EKG Interpretation Date/Time:  Tuesday November 21 2023 10:41:48 EDT Ventricular Rate:  86 PR Interval:  154 QRS Duration:  82 QT Interval:  408 QTC Calculation: 488 R Axis:   -83  Text Interpretation: Normal sinus rhythm Biatrial enlargement Pulmonary disease pattern Left anterior fascicular block Left ventricular hypertrophy ( R in aVL , Cornell product , Romhilt-Estes ) When compared with ECG of 22-Nov-2021 11:53, No significant change since last tracing Confirmed by Mona Kent [phone]) on  11/21/2023 10:48:07 AM    ASSESSMENT: Chronic diastolic congestive heart failure Hypertensive heart disease, severe LVH Aortic stenosis Dyslipidemia End-stage renal disease -status post renal transplant, immunosuppressed  PLAN: 1.   [name] seems to be doing well although was noted to have severe LVH, atrial enlargement and aortic  valve stenosis on echo in 2022.  She will need an updated echo.  This is concerning possibly for amyloidosis.  I would like to send further studies including and amyloid lab panel and PYP study to further evaluate for this.  She may ultimately need a cardiac MRI if the studies are abnormal.  Perhaps this may confirm that she has a hypertensive heart disease.  Follow-up with me afterwards  Vinie KYM Maxcy, MD, [facility] FNLA, FACP  [place]  [facility]  Medical Director of the Advanced Lipid Disorders &  Cardiovascular Risk Reduction Clinic Diplomate of the American Board of Clinical Lipidology Attending Cardiologist  Direct Dial : [phone]  Fax: [phone]  Website:  [email or web address] Vinie BROCKS Pearlina Friedly 11/21/2023, 10:48 AM

## 2023-11-22 ENCOUNTER — Ambulatory Visit (HOSPITAL_BASED_OUTPATIENT_CLINIC_OR_DEPARTMENT_OTHER)
Admission: RE | Admit: 2023-11-22 | Discharge: 2023-11-22 | Disposition: A | Discharge status: Home / Self Care | Source: Ambulatory Visit | Attending: Cardiology | Admitting: Cardiology

## 2023-11-22 ENCOUNTER — Other Ambulatory Visit: Payer: Self-pay | Admitting: Internal Medicine

## 2023-11-22 ENCOUNTER — Ambulatory Visit (HOSPITAL_COMMUNITY)
Admission: RE | Admit: 2023-11-22 | Discharge: 2023-11-22 | Disposition: A | Discharge status: Home / Self Care | Source: Ambulatory Visit | Attending: Cardiology | Admitting: Cardiology

## 2023-11-22 DIAGNOSIS — R9431 Abnormal electrocardiogram [ECG] [EKG]: Secondary | ICD-10-CM

## 2023-11-22 DIAGNOSIS — I517 Cardiomegaly: Secondary | ICD-10-CM

## 2023-11-22 DIAGNOSIS — I350 Nonrheumatic aortic (valve) stenosis: Secondary | ICD-10-CM | POA: Insufficient documentation

## 2023-11-22 DIAGNOSIS — I5033 Acute on chronic diastolic (congestive) heart failure: Secondary | ICD-10-CM | POA: Insufficient documentation

## 2023-11-22 LAB — ECHOCARDIOGRAM COMPLETE
Area-P 1/2: 1.9 cm2
S' Lateral: 2.06 cm

## 2023-11-22 LAB — KAPPA/LAMBDA LIGHT CHAINS
Ig Kappa Free Light Chain: 25.3 mg/L — ABNORMAL HIGH (ref 3.3–19.4)
Ig Lambda Free Light Chain: 22.2 mg/L (ref 5.7–26.3)
KAPPA/LAMBDA RATIO: 1.14 (ref 0.26–1.65)

## 2023-11-22 MED ORDER — TECHNETIUM TC 99M PYROPHOSPHATE
21.0000 | Freq: Once | INTRAVENOUS | Status: AC
  Administered 2023-11-22: 21 via INTRAVENOUS

## 2023-11-27 ENCOUNTER — Other Ambulatory Visit (HOSPITAL_COMMUNITY)
Admission: AD | Admit: 2023-11-27 | Discharge: 2023-11-27 | Disposition: A | Discharge status: Home / Self Care | Source: Ambulatory Visit | Attending: Physician Assistant | Admitting: Physician Assistant

## 2023-11-27 DIAGNOSIS — Z79899 Other long term (current) drug therapy: Secondary | ICD-10-CM | POA: Diagnosis not present

## 2023-11-27 DIAGNOSIS — Z940 Kidney transplant status: Secondary | ICD-10-CM | POA: Insufficient documentation

## 2023-11-27 LAB — CBC WITH DIFFERENTIAL/PLATELET
Abs Immature Granulocytes: 0.02 K/uL (ref 0.00–0.07)
Basophils Absolute: 0 K/uL (ref 0.0–0.1)
Basophils Relative: 1 %
Eosinophils Absolute: 0.1 K/uL (ref 0.0–0.5)
Eosinophils Relative: 3 %
HCT: 44.6 % (ref 36.0–46.0)
Hemoglobin: 14.5 g/dL (ref 12.0–15.0)
Immature Granulocytes: 0 %
Lymphocytes Relative: 21 %
Lymphs Abs: 1 K/uL (ref 0.7–4.0)
MCH: 31.3 pg (ref 26.0–34.0)
MCHC: 32.5 g/dL (ref 30.0–36.0)
MCV: 96.3 fL (ref 80.0–100.0)
Monocytes Absolute: 0.5 K/uL (ref 0.1–1.0)
Monocytes Relative: 11 %
Neutro Abs: 3 K/uL (ref 1.7–7.7)
Neutrophils Relative %: 64 %
Platelets: 199 K/uL (ref 150–400)
RBC: 4.63 MIL/uL (ref 3.87–5.11)
RDW: 13.5 % (ref 11.5–15.5)
WBC: 4.7 K/uL (ref 4.0–10.5)
nRBC: 0 % (ref 0.0–0.2)

## 2023-11-27 LAB — PHOSPHORUS: Phosphorus: 3.5 mg/dL (ref 2.5–4.6)

## 2023-11-27 LAB — MULTIPLE MYELOMA PANEL, SERUM
Albumin SerPl Elph-Mcnc: 3.8 g/dL (ref 2.9–4.4)
Albumin/Glob SerPl: 1.3 (ref 0.7–1.7)
Alpha 1: 0.2 g/dL (ref 0.0–0.4)
Alpha2 Glob SerPl Elph-Mcnc: 0.7 g/dL (ref 0.4–1.0)
B-Globulin SerPl Elph-Mcnc: 1 g/dL (ref 0.7–1.3)
Gamma Glob SerPl Elph-Mcnc: 1.2 g/dL (ref 0.4–1.8)
Globulin, Total: 3.1 g/dL (ref 2.2–3.9)
IgA/Immunoglobulin A, Serum: 226 mg/dL (ref 64–422)
IgG (Immunoglobin G), Serum: 1035 mg/dL (ref 586–1602)
IgM (Immunoglobulin M), Srm: 236 mg/dL — ABNORMAL HIGH (ref 26–217)
Total Protein: 6.9 g/dL (ref 6.0–8.5)

## 2023-11-27 LAB — MAGNESIUM: Magnesium: 1.8 mg/dL (ref 1.7–2.4)

## 2023-11-27 LAB — COMPREHENSIVE METABOLIC PANEL WITH GFR
ALT: 49 U/L — ABNORMAL HIGH (ref 0–44)
AST: 39 U/L (ref 15–41)
Albumin: 3.8 g/dL (ref 3.5–5.0)
Alkaline Phosphatase: 89 U/L (ref 38–126)
Anion gap: 12 (ref 5–15)
BUN: 18 mg/dL (ref 8–23)
CO2: 25 mmol/L (ref 22–32)
Calcium: 9.1 mg/dL (ref 8.9–10.3)
Chloride: 100 mmol/L (ref 98–111)
Creatinine, Ser: 0.97 mg/dL (ref 0.44–1.00)
GFR, Estimated: >60 mL/min (ref >60)
Glucose, Bld: 111 mg/dL — ABNORMAL HIGH (ref 70–99)
Potassium: 3.5 mmol/L (ref 3.5–5.1)
Sodium: 137 mmol/L (ref 135–145)
Total Bilirubin: 0.9 mg/dL (ref 0.0–1.2)
Total Protein: 7 g/dL (ref 6.5–8.1)

## 2023-11-27 LAB — IMMUNOFIXATION, URINE

## 2023-11-28 ENCOUNTER — Ambulatory Visit: Payer: Self-pay | Admitting: Internal Medicine

## 2023-11-28 LAB — BK QUANT PCR (PLASMA/SERUM): BK Quantitaion PCR: NEGATIVE [IU]/mL

## 2023-11-29 LAB — TACROLIMUS LEVEL: Tacrolimus (FK506) - LabCorp: 5.1 ng/mL (ref 5.0–20.0)

## 2023-12-07 NOTE — Telephone Encounter (Signed)
 Pt was scheduled with Dr. Mona yesterday for 02/09/24. She was added to waitlist as well. She called in asking if she needs to be seen sooner.

## 2023-12-19 ENCOUNTER — Ambulatory Visit: Admitting: Podiatry

## 2023-12-19 ENCOUNTER — Encounter: Payer: Self-pay | Admitting: Podiatry

## 2023-12-19 DIAGNOSIS — M79674 Pain in right toe(s): Secondary | ICD-10-CM | POA: Diagnosis not present

## 2023-12-19 DIAGNOSIS — E1151 Type 2 diabetes mellitus with diabetic peripheral angiopathy without gangrene: Secondary | ICD-10-CM

## 2023-12-19 DIAGNOSIS — M79675 Pain in left toe(s): Secondary | ICD-10-CM

## 2023-12-19 DIAGNOSIS — Z0189 Encounter for other specified special examinations: Secondary | ICD-10-CM

## 2023-12-19 DIAGNOSIS — B351 Tinea unguium: Secondary | ICD-10-CM

## 2023-12-19 DIAGNOSIS — Z940 Kidney transplant status: Secondary | ICD-10-CM

## 2023-12-19 DIAGNOSIS — E119 Type 2 diabetes mellitus without complications: Secondary | ICD-10-CM

## 2023-12-24 NOTE — Progress Notes (Signed)
  Subjective:  Patient ID: [name], female    DOB: 1951/01/03,  MRN: [id number]  JAXSYN CATALFAMO presents to clinic today for at risk foot care. Pt has h/o NIDDM with PAD and painful mycotic toenails of both feet that are difficult to trim. Pain interferes with daily activities and wearing enclosed shoe gear comfortably.  Chief Complaint  Patient presents with   RFC    RFC Diabetic A1c 6.6 [name], [place] (PCP), 10/19/23    New problem(s): None.   PCP is Maria Ronal Crank, PA-C.  Allergies  Allergen Reactions   Tape Other (See Comments)    Paper tape rips skin.    Semaglutide  Rash    Rybelsus  3 mg    Review of Systems: Negative except as noted in the HPI.  Objective: No changes noted in today's physical examination. There were no vitals filed for this visit. [name] is a pleasant 73 y.o. female WD, WN in NAD. AAO x 3.   Diabetic foot exam was performed with the following findings:   Vascular Examination: CFT <3 seconds b/l. DP pulses faintly palpable b/l. PT pulses nonpalpable b/l. Digital hair absent. Skin temperature gradient warm to warm b/l. No pain with calf compression. No ischemia or gangrene. No cyanosis or clubbing noted b/l.    Neurological Examination: Sensation grossly intact b/l with 10 gram monofilament.   Dermatological Examination: Pedal skin warm and supple b/l. No open wounds b/l. No interdigital macerations. Toenails 1-5 b/l thick, discolored, elongated with subungual debris and pain on dorsal palpation. No corns, calluses, nor porokeratotic lesions.   Musculoskeletal Examination: Muscle strength 5/5 to all lower extremity muscle groups bilaterally. No pain, crepitus or joint limitation noted with ROM bilateral LE. No gross bony deformities bilaterally.  Radiographs: None     Assessment/Plan: 1. Pain due to onychomycosis of toenails of both feet   2. Type II diabetes mellitus with peripheral circulatory disorder  (HCC)   3. Renal transplant recipient   4. Encounter for diabetic foot exam (HCC)     VAS US  ABI WITH/WO TBI -Consent given for treatment as described below: -Examined patient. -Due to risk factor(s), ordered noninvasive arterial studies ABIs with and without TBIs for b/l lower extremities. -Diabetic foot examination performed today. -Continue diabetic foot care principles: inspect feet daily, monitor glucose as recommended by PCP and/or Endocrinologist, and follow prescribed diet per PCP, Endocrinologist and/or dietician. -Toenails 1-5 b/l were debrided in length and girth with sterile nail nippers and dremel without iatrogenic bleeding.  -Patient/POA to call should there be question/concern in the interim.   Return in about 3 months (around 03/20/2024).  [name], DPM      [place] LOCATION: [address] [place] [address]                   Office [phone]   [facility] LOCATION: [address] [place], [place] [address] Office [phone]

## 2024-01-08 ENCOUNTER — Ambulatory Visit: Admitting: Endocrinology

## 2024-01-09 ENCOUNTER — Telehealth: Admitting: Podiatry

## 2024-01-09 ENCOUNTER — Ambulatory Visit (HOSPITAL_COMMUNITY): Admission: RE | Admit: 2024-01-09 | Discharge: 2024-01-09 | Attending: Podiatry

## 2024-01-09 ENCOUNTER — Other Ambulatory Visit: Payer: Self-pay | Admitting: Occupational Therapy

## 2024-01-09 ENCOUNTER — Ambulatory Visit: Admitting: Podiatry

## 2024-01-09 DIAGNOSIS — Z940 Kidney transplant status: Secondary | ICD-10-CM

## 2024-01-09 DIAGNOSIS — R6889 Other general symptoms and signs: Secondary | ICD-10-CM

## 2024-01-09 DIAGNOSIS — E1151 Type 2 diabetes mellitus with diabetic peripheral angiopathy without gangrene: Secondary | ICD-10-CM

## 2024-01-09 LAB — VAS US ABI WITH/WO TBI
Left ABI: 0.59
Right ABI: 0.48

## 2024-01-09 NOTE — Telephone Encounter (Signed)
 Called patient to inform her about her ABI results. Results warrant referral to Vascular Surgery for further workup. Patient related understanding and will await call from Vascular Surgery Office.  1. Abnormal ankle brachial index (ABI)   2. Renal transplant recipient   3. Type II diabetes mellitus with peripheral circulatory disorder [facility])    Orders Placed This Encounter  Procedures   Ambulatory referral to Vascular Surgery    Referral Priority:   Routine    Referral Type:   Surgical    Referral Reason:   Specialty Services Required    Referred to Provider:   Sheree Penne Bruckner, MD    Requested Specialty:   Vascular Surgery    Number of Visits Requested:   1

## 2024-01-10 NOTE — Patient Outreach (Signed)
 Aging Gracefully Program  OT Follow-Up Visit  01/10/2024  [name] 21-Dec-1950 [id number]  Visit:  3- Third Visit  Start Time:  0900 End Time:  0920 Total Minutes:  20  Readiness to Change Score :  Readiness to Change Score: 10     Durable Medical Equipment: Durable Medical Equipment: Shower Chair With Back Durable Medical Equipment Distribution Date: 01/10/24  Patient Education: Education Provided: Yes Education Details: Educated on use of shower chair and Programmer, Systems) Educated: Patient Comprehension: Verbalized Understanding, Returned Demonstration  Goals:   Goals Addressed             This Visit's Progress    COMPLETED: Patient Stated       Pt will increase safety during bathing by getting into and out of the shower independently using grab bars and DME as needed.   OT ACTION PLAN: Bathing  Target Problem Area:   Getting into/out of shower, bathing safely  Why Problem May Occur:     Pain in knees  High tub wall  No grab bars   Target Goal(s):   Pt will increase safety during bathing by getting into and out of the shower independently using grab bars and DME as needed.   STRATEGIES   Saving Your Energy DO:  Use a tub bench/seat   Use a long-handled sponge  Keep frequently used items within easy reach    Modifying your home environment and making it safe     DO:  Install grab bars in the shower and next to the toilet  Use shower grippers to enhance safety with wet floor  Make sure the bathroom is well-lit   Simplifying the way you set up tasks or daily routines DO:  Plan to bathe/shower before you're overly tired  Gather everything you will need before beginning    PRACTICE  Based on what we have talked about, you are willing to try:  Use shower chair for safety with balance when bathing  Use tub grippers for enhanced grip when in wet shower  If an idea does not work the first time, try it again (and  again).  We may make some changes over the next few sessions, based on how they work.         Post Clinical Reasoning: Client Action (Goal) Two Interventions: Pt will increase safety during bathing by getting into and out of the shower independently using grab bars and DME as needed. Did Client Try?: Yes Targeted Problem Area Status: A Lot Better  Clinician View Of Client Situation:: Shower and bathroom modifications have been completed, pt reports she LOVES her shower. Pt educated on use of shower seat, seat adjusted to the correct height for pt. Pt independent in getting on and off of the seat.  Client View Of His/Her Situation:: Very excited about her shower, excited to use shower seat during bathing tasks. Continues to have motivation to work on mobility and keep herself safe.  Next Visit Plan:: Review functional mobility, discharge pt   Sonny Cory, OTR/L  [phone] 01/09/24

## 2024-01-23 ENCOUNTER — Telehealth: Payer: Self-pay | Admitting: Endocrinology

## 2024-01-23 DIAGNOSIS — E1165 Type 2 diabetes mellitus with hyperglycemia: Secondary | ICD-10-CM

## 2024-01-23 NOTE — Telephone Encounter (Signed)
 Patient is calling to let Dr. Mercie know that she got a call from her insurance company and they told her that after January 25, 2024 that they no longer would cover Tresiba .  Per patient her insurance will only cover Toujeo  or Lantus  after January 25, 2024.

## 2024-01-24 MED ORDER — LANTUS SOLOSTAR 100 UNIT/ML ~~LOC~~ SOPN: 8.0000 [IU] | PEN_INJECTOR | Freq: Every day | SUBCUTANEOUS | 4 refills | Status: AC

## 2024-01-24 NOTE — Addendum Note (Signed)
 Addended by: Tywana Robotham on: 01/24/2024 11:54 AM   Modules accepted: Orders

## 2024-01-24 NOTE — Telephone Encounter (Signed)
 Sent prescription for Lantus  8 units daily.

## 2024-02-09 ENCOUNTER — Encounter: Payer: Self-pay | Admitting: Internal Medicine

## 2024-02-09 ENCOUNTER — Ambulatory Visit: Attending: Internal Medicine | Admitting: Internal Medicine

## 2024-02-09 VITALS — BP 154/68 | HR 65 | Ht 62.0 in | Wt 121.7 lb

## 2024-02-09 DIAGNOSIS — N186 End stage renal disease: Secondary | ICD-10-CM | POA: Diagnosis not present

## 2024-02-09 DIAGNOSIS — Z940 Kidney transplant status: Secondary | ICD-10-CM

## 2024-02-09 DIAGNOSIS — I10 Essential (primary) hypertension: Secondary | ICD-10-CM

## 2024-02-09 DIAGNOSIS — I517 Cardiomegaly: Secondary | ICD-10-CM | POA: Diagnosis not present

## 2024-02-09 DIAGNOSIS — I422 Other hypertrophic cardiomyopathy: Secondary | ICD-10-CM

## 2024-02-09 NOTE — Patient Instructions (Signed)
 Medication Instructions:  None  *If you need a refill on your cardiac medications before your next appointment, please call your pharmacy*  Lab Work: None  If you have labs (blood work) drawn today and your tests are completely normal, you will receive your results only by: MyChart Message (if you have MyChart) OR A paper copy in the mail If you have any lab test that is abnormal or we need to change your treatment, we will call you to review the results.  Testing/Procedures: Cardiac MRI Your physician has requested that you have a cardiac MRI. Cardiac MRI uses a computer to create images of your heart as its beating, producing both still and moving pictures of your heart and major blood vessels. For further information please visit [email or web address]. Please follow the instruction sheet given to you today for more information.     You are scheduled for Cardiac MRI at the location below.  Please arrive for your appointment at ______________ . ?  [facility] [address] [place], [place] [address] Please take advantage of the free valet parking available at the [facility] and [employer] (Entrance C).  Proceed to the [facility] Radiology Department (First Floor) for check-in.   OR   [facility] [address] [place], [place] [address] Please go to the [facility] and check-in with the desk attendant.   Magnetic resonance imaging (MRI) is a painless test that produces images of the inside of the body without using Xrays.  During an MRI, strong magnets and radio waves work together in a data processing manager to form detailed images.   MRI images may provide more details about a medical condition than X-rays, CT scans, and ultrasounds can provide.  You may be given earphones to listen for instructions.  You may eat a light breakfast and take medications as ordered with the exception of furosemide , hydrochlorothiazide , chlorthalidone  or  spironolactone  (or any other fluid pill). If you are undergoing a stress MRI, please avoid stimulants for 12 hr prior to test. (I.e. Caffeine, nicotine , chocolate, or antihistamine medications)  If your provider has ordered anti-anxiety medications for this test, then you will need a driver.  An IV will be inserted into one of your veins. Contrast material will be injected into your IV. It will leave your body through your urine within a day. You may be told to drink plenty of fluids to help flush the contrast material out of your system.  You will be asked to remove all metal, including: Watch, jewelry, and other metal objects including hearing aids, hair pieces and dentures. Also wearable glucose monitoring systems (ie. Freestyle Libre and Omnipods) (Braces and fillings normally are not a problem.)   TEST WILL TAKE APPROXIMATELY 1 HOUR  PLEASE NOTIFY SCHEDULING AT LEAST 24 HOURS IN ADVANCE IF YOU ARE UNABLE TO KEEP YOUR APPOINTMENT. [phone]  For more information and frequently asked questions, please visit our website : [email or web address]  Please call the Cardiac Imaging Nurse Navigators with any questions/concerns. [phone] Office    Follow-Up: At [facility], you and your health needs are our priority.  As part of our continuing mission to provide you with exceptional heart care, our providers are all part of one team.  This team includes your primary Cardiologist (physician) and Advanced Practice Providers or APPs (Physician Assistants and Nurse Practitioners) who all work together to provide you with the care you need, when you need it.  Your next appointment:   6 month(s)  Provider:   Vinie JAYSON Maxcy, MD    We recommend signing up for the patient portal called MyChart.  Sign up information is provided on this After Visit Summary.  MyChart is used to connect with patients for Virtual Visits (Telemedicine).  Patients are able to view lab/test  results, encounter notes, upcoming appointments, etc.  Non-urgent messages can be sent to your provider as well.   To learn more about what you can do with MyChart, go to [email or web address].   Other Instructions None

## 2024-02-09 NOTE — Progress Notes (Signed)
 "   OFFICE NOTE  Chief Complaint:  Follow-up heart failure  Primary Care Physician: Latisha Ronal Crank, PA-C  HPI:  [name] is a 74 y.o. female with a past medial history significant for type II diabetes melitis, hypertension, CVA, acute on chronic diastolic CHF, GERD, CKD stage IV, tobacco abuse, HLD, systolic murmur, vitamin D  deficiency, and anemia secondary to GI bleed.  She was hospitalized April 25-29 and then May 11-18 with diagnosis of symptomatic anemia possibly related to GI bleeding and again with CHF.  She underwent EGD and colonoscopy which showed no source of bleeding.  She returned to the emergency department 06/27/2020 with shortness of breath, weakness, and hemoglobin of 5.2.  She had a hemoglobin of 8.9 at discharge on May 18.  She denied melena and bright red rectal bleeding.  She reported having taken aspirin  325 daily and Aleve 2 days prior to her admission.  She received 2 units of PRBCs.  She underwent capsule endoscopy which was pending at the time of her discharge.  Her aspirin  and NSAID were held.  She was not noted to have bleeding during her hospital stay.  She remained stable and was discharged 06/29/2020.  11/23/2020  She was seen out of the hospital by Josefa Beauvais, NP for diastolic heart failure.  A repeat echo showed that the LVEF was 70 to 75% with severe LV wall thickening and atrial enlargement.  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  She is on numerous medications and it appears that her blood pressure is now finally better controlled at 128/60.  She is progressed to dialysis and is using a dialysis catheter.  She does have a left arm fistula which was recently revised and hopefully will begin using soon.  She denies any worsening shortness of breath or chest pain.  11/21/2023  [name] returns today for follow-up.  Recently she was seen by Josefa Beauvais again for follow-up.  Echo has showed severe LV wall thickening which was  concerning for hypertensive cardiomyopathy however amyloidosis could not be ruled out.  She has not had a repeat echo since 2022.  She continues to do well with renal transplant and is followed at [facility] for this.  She was noted to have some mild aortic stenosis.  She denies any carpal tunnel symptoms.  Blood pressure today was 110/76.  Her diabetes control has improved somewhat with recent A1c of 6.6%.  She is followed by endocrinology.  02/09/2024  [name] is seen today in follow-up.  She did have a abnormal echo which I discussed previously showing severe LV wall thickening and increased LVOT gradient concerning for either hypertensive or hypertrophic cardiomyopathy.  I am also concerned about the risk of amyloidosis.  She did have a PYP study however which was negative and her amyloid panel showed polyclonal increase in M spike but no evidence of monoclonal changes suggestive of amyloid.  She continues to have an outflow tract murmur and this could again represent a variant of hypertrophic cardiomyopathy.  She also has a fistula and therefore is in a high output state despite being renally transplanted.  This may also explain her outflow tract gradient to some extent.  PMHx:  Past Medical History:  Diagnosis Date   Arthritis    Chronic back pain    Chronic kidney disease (CKD)    Constipation    COPD (chronic obstructive pulmonary disease) (HCC)    Cough    Diabetes mellitus, type 2 (HCC)  Diverticulosis    Fibroid    patient thinks this was the reason for her hysterectomy   GERD (gastroesophageal reflux disease)    Heart murmur    History of sebaceous cyst    Hyperlipemia    Hyperplastic colon polyp    Hypertension    Hyperthyroidism    Lumbar radiculopathy    Shortness of breath 09/13/2013   Stroke (HCC) 2015?   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    Past Surgical History:  Procedure Laterality Date   ABDOMINAL  HYSTERECTOMY     --?ovaries remain   AV FISTULA PLACEMENT Left 07/16/2020   Procedure: LEFT BRACHIOCEPHALIC ARTERIOVENOUS (AV) FISTULA CREATION;  Surgeon: Magda Debby SAILOR, MD;  Location: [facility] OR;  Service: Vascular;  Laterality: Left;  PERIPHERAL NERVE BLOCK   COLONOSCOPY W/ BIOPSIES  09/11/2008   ESOPHAGOGASTRODUODENOSCOPY (EGD) WITH PROPOFOL  N/A 05/20/2020   Procedure: ESOPHAGOGASTRODUODENOSCOPY (EGD) WITH PROPOFOL ;  Surgeon: Abran Norleen SAILOR, MD;  Location: [facility] ENDOSCOPY;  Service: Endoscopy;  Laterality: N/A;   GIVENS CAPSULE STUDY N/A 06/28/2020   Procedure: GIVENS CAPSULE STUDY;  Surgeon: Abran Norleen SAILOR, MD;  Location: [facility] ENDOSCOPY;  Service: Endoscopy;  Laterality: N/A;   RETINOPATHY SURGERY Bilateral 2013   Dr. alvia    FAMHx:  Family History  Problem Relation Age of Onset   Hypertension Mother    Sleep apnea Mother    Diabetes Mother    Hyperlipidemia Mother    Lung cancer Father        smoker   Hypertension Sister    Diabetes Sister    Hypertension Sister    Hypertension Brother    Diabetes Brother    Hypertension Brother    Breast cancer Neg Hx    Colon cancer Neg Hx    Esophageal cancer Neg Hx    Pancreatic cancer Neg Hx    Liver disease Neg Hx    Colon polyps Neg Hx    BRCA 1/2 Neg Hx     SOCHx:   reports that she has been smoking cigarettes. She started smoking about 44 years ago. She has a 17.5 pack-year smoking history. She has never used smokeless tobacco. She reports that she does not drink alcohol and does not use drugs.  ALLERGIES:  Allergies  Allergen Reactions   Tape Other (See Comments)    Paper tape rips skin.    Semaglutide  Rash    Rybelsus  3 mg    ROS: Pertinent items noted in HPI and remainder of comprehensive ROS otherwise negative.  HOME MEDS: Current Outpatient Medications on File Prior to Visit  Medication Sig Dispense Refill   atorvastatin  (LIPITOR ) 40 MG tablet Take 40 mg by mouth at bedtime.     blood glucose meter kit and supplies  KIT Dispense based on patient and insurance preference. Use up to four times daily as directed. 1 each 0   Blood Glucose Monitoring Suppl (ONETOUCH VERIO FLEX SYSTEM) w/Device KIT 1 each by Does not apply route 2 (two) times daily. E11.9     Continuous Blood Gluc Receiver (DEXCOM G7 RECEIVER) DEVI Used to monitor blood sugar. Replace once per year.     Continuous Glucose Sensor (DEXCOM G7 SENSOR) MISC Change sensor every 10 days as directed by manufacturer 6 each 6   glucose blood (ONETOUCH VERIO) test strip USE AS DIRECTED TWICE DAILY 200 strip 3   insulin  glargine (LANTUS  SOLOSTAR) 100 UNIT/ML Solostar Pen  Inject 8 Units into the skin daily. 15 mL 4   insulin  lispro (HUMALOG ) 100 UNIT/ML KwikPen 3 to 4 units before each meal 15 mL 3   Insulin  Pen Needle 32G X 4 MM MISC Use 4x a day 300 each 3   JARDIANCE  10 MG TABS tablet Take 1 tablet (10 mg total) by mouth in the morning. 90 tablet 3   metoprolol  succinate (TOPROL -XL) 100 MG 24 hr tablet Take 100 mg by mouth in the morning.     Multiple Vitamin (MULTIVITAMIN WITH MINERALS) TABS tablet Take 1 tablet by mouth in the morning.     mycophenolate (CELLCEPT) 250 MG capsule Take 250 mg by mouth 2 (two) times daily.     NIFEdipine (PROCARDIA XL/NIFEDICAL XL) 60 MG 24 hr tablet Take 60 mg by mouth 2 (two) times daily.     OneTouch Delica Lancets 33G MISC 1 each by Does not apply route 2 (two) times daily. E11.9     pantoprazole  (PROTONIX ) 40 MG tablet Take 1 tablet (40 mg total) by mouth daily. 90 tablet 3   polyethylene glycol (MIRALAX  / GLYCOLAX ) 17 g packet Take 17 g by mouth daily as needed (constipation.).     predniSONE  (DELTASONE ) 5 MG tablet Take 5 mg by mouth in the morning.     spironolactone  (ALDACTONE ) 50 MG tablet Take 1 tablet (50 mg total) by mouth 2 (two) times daily. 180 tablet 3   tacrolimus  (PROGRAF ) 1 MG capsule Take 3 mg by mouth in the morning and at bedtime.     Varenicline Tartrate, Starter, 0.5 MG X 11 & 1 MG X 42 TBPK Take  0.5-1 mg by mouth as directed. TAKE 1 TABLET DAILY ON DAYS 1-3 AND 1 TABLET TWICE DAILY ON DAYS 4-7. THEN 1 TAB TWICE DAIL     No current facility-administered medications on file prior to visit.    LABS/IMAGING: No results found for this or any previous visit (from the past 48 hours).  No results found.  LIPID PANEL:    Component Value Date/Time   CHOL 155 07/11/2018 1258   TRIG 151.0 (H) 07/11/2018 1258   HDL 44.70 07/11/2018 1258   CHOLHDL 3 07/11/2018 1258   VLDL 30.2 07/11/2018 1258   LDLCALC 80 07/11/2018 1258   LDLDIRECT 69.0 09/29/2021 1534     WEIGHTS: Wt Readings from Last 3 Encounters:  02/09/24 121 lb 11.2 oz (55.2 kg)  11/21/23 117 lb 11.2 oz (53.4 kg)  11/20/23 116 lb (52.6 kg)    VITALS: BP (!) 154/68   Pulse 65   Ht 5' 2 (1.575 m)   Wt 121 lb 11.2 oz (55.2 kg)   SpO2 99%   BMI 22.26 kg/m   EXAM: General appearance: alert and no distress Neck: no carotid bruit, no JVD, and thyroid  not enlarged, symmetric, no tenderness/mass/nodules Lungs: clear to auscultation bilaterally Heart: regular rate and rhythm Abdomen: soft, non-tender; bowel sounds normal; no masses,  no organomegaly Extremities: extremities normal, atraumatic, no cyanosis or edema and left upper extremity fistula with positive thrill Pulses: 2+ and symmetric Skin: Skin color, texture, turgor normal. 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   [name] continues to have a persistent LV outflow tract gradient with septal thickness of 16  mm and a gradient of 85 mmHg then increased to 117 mmHg with Valsalva.  LVEF was 70 to 75% and she  does have a fistula which could be contributing to higher output.  This is possibly HOCM.  I would advise a cardiac MRI to evaluate further.  She could also have advanced hypertensive heart disease as it is thought that maybe this may have led to her renal failure or her diabetes.  Plan follow-up afterwards in about 6 months and if there is evidence of HOCM by MRI, would likely refer to our hypertrophic cardiomyopathy clinic.  Fortunately, at this point she seems asymptomatic.  Vinie KYM Maxcy, MD, [facility], FNLA, FACP  [place]  [facility]  Medical Director of the Advanced Lipid Disorders &  Cardiovascular Risk Reduction Clinic Diplomate of the American Board of Clinical Lipidology Attending Cardiologist  Direct Dial : [phone]  Fax: [phone]  Website:  [email or web address] Vinie BROCKS Remigio Mcmillon 02/09/2024, 8:19 AM  "

## 2024-02-10 LAB — CBC
Hematocrit: 43.8 % (ref 34.0–46.6)
Hemoglobin: 14.9 g/dL (ref 11.1–15.9)
MCH: 33 pg (ref 26.6–33.0)
MCHC: 34 g/dL (ref 31.5–35.7)
MCV: 97 fL (ref 79–97)
Platelets: 236 x10E3/uL (ref 150–450)
RBC: 4.51 x10E6/uL (ref 3.77–5.28)
RDW: 12.1 % (ref 11.7–15.4)
WBC: 7.8 x10E3/uL (ref 3.4–10.8)

## 2024-02-12 ENCOUNTER — Ambulatory Visit: Payer: Self-pay | Admitting: Internal Medicine

## 2024-02-12 DIAGNOSIS — I422 Other hypertrophic cardiomyopathy: Secondary | ICD-10-CM

## 2024-02-21 ENCOUNTER — Ambulatory Visit (HOSPITAL_COMMUNITY)
Admission: RE | Admit: 2024-02-21 | Discharge: 2024-02-21 | Disposition: A | Discharge status: Home / Self Care | Source: Ambulatory Visit | Attending: Internal Medicine | Admitting: Internal Medicine

## 2024-02-21 ENCOUNTER — Other Ambulatory Visit: Payer: Self-pay | Admitting: Internal Medicine

## 2024-02-21 DIAGNOSIS — I422 Other hypertrophic cardiomyopathy: Secondary | ICD-10-CM | POA: Diagnosis present

## 2024-02-21 MED ORDER — GADOBUTROL 1 MMOL/ML IV SOLN
10.0000 mL | Freq: Once | INTRAVENOUS | Status: AC | PRN
  Administered 2024-02-21: 10 mL via INTRAVENOUS

## 2024-02-26 NOTE — Addendum Note (Signed)
 Addended by: Bernie Ransford L on: 02/26/2024 12:47 PM   Modules accepted: Orders

## 2024-02-29 ENCOUNTER — Encounter: Payer: Self-pay | Admitting: Vascular Surgery

## 2024-02-29 ENCOUNTER — Ambulatory Visit: Admitting: Vascular Surgery

## 2024-02-29 VITALS — BP 167/79 | HR 62 | Temp 98.4°F | Resp 20 | Ht 62.0 in | Wt 116.6 lb

## 2024-02-29 DIAGNOSIS — I70213 Atherosclerosis of native arteries of extremities with intermittent claudication, bilateral legs: Secondary | ICD-10-CM | POA: Diagnosis not present

## 2024-02-29 NOTE — Progress Notes (Signed)
 " Office Note    CC: Concern for bilateral lower extremity peripheral arterial disease Requesting Provider:  Latisha Ronal Crank, PA-C  HPI: [name] is a 74 y.o. (06-Mar-1950) female presenting at the request of .Latisha Ronal Crank, PA-C concern for bilateral lower extremity peripheral arterial disease.  On exam, [name] was doing well.  A native [name], she has lived in [place] her entire life.  She worked at: [employer] prior to retirement.  Patient had incisional disease with DCD kidney and 2023 at [facility].  The kidney has been functioning nicely.  She presents today due to some cramping in the lower extremities.  States that she has cramping overnight, however it is not every night.  Notes some mild claudication symptoms in the thigh, but states that this does not hold her back.  Denies wounds.  Denies ischemic rest pain    Past Medical History:  Diagnosis Date   Arthritis    Chronic back pain    Chronic kidney disease (CKD)    Constipation    COPD (chronic obstructive pulmonary disease) (HCC)    Cough    Diabetes mellitus, type 2 (HCC)    Diverticulosis    Fibroid    patient thinks this was the reason for her hysterectomy   GERD (gastroesophageal reflux disease)    Heart murmur    History of sebaceous cyst    Hyperlipemia    Hyperplastic colon polyp    Hypertension    Hyperthyroidism    Lumbar radiculopathy    Shortness of breath 09/13/2013   Stroke (HCC) 2015?   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    Past Surgical History:  Procedure Laterality Date   ABDOMINAL HYSTERECTOMY     --?ovaries remain   AV FISTULA PLACEMENT Left 07/16/2020   Procedure: LEFT BRACHIOCEPHALIC ARTERIOVENOUS (AV) FISTULA CREATION;  Surgeon: Magda Debby SAILOR, MD;  Location: [facility] OR;  Service: Vascular;  Laterality: Left;  PERIPHERAL NERVE BLOCK   COLONOSCOPY W/ BIOPSIES  09/11/2008   ESOPHAGOGASTRODUODENOSCOPY (EGD) WITH  PROPOFOL  N/A 05/20/2020   Procedure: ESOPHAGOGASTRODUODENOSCOPY (EGD) WITH PROPOFOL ;  Surgeon: Abran Norleen SAILOR, MD;  Location: [facility] ENDOSCOPY;  Service: Endoscopy;  Laterality: N/A;   GIVENS CAPSULE STUDY N/A 06/28/2020   Procedure: GIVENS CAPSULE STUDY;  Surgeon: Abran Norleen SAILOR, MD;  Location: [facility] ENDOSCOPY;  Service: Endoscopy;  Laterality: N/A;   RETINOPATHY SURGERY Bilateral 2013   Dr. alvia    Social History   Socioeconomic History   Marital status: Divorced    Spouse name: Not on file   Number of children: 1   Years of education: 11   Highest education level: Not on file  Occupational History   Occupation: Engineer, Drilling: [employer]    Comment: Retired  Tobacco Use   Smoking status: Some Days    Current packs/day: 0.00    Average packs/day: 0.5 packs/day for 35.0 years (17.5 ttl pk-yrs)    Types: Cigarettes    Start date: 07/07/1979    Last attempt to quit: 07/07/2014    Years since quitting: 9.6   Smokeless tobacco: Never   Tobacco comments:    Smoking some again    02/12/2022 patient smokes 1 -2 some days  Vaping Use   Vaping status: Never Used  Substance and Sexual Activity   Alcohol use: No   Drug use: No   Sexual activity: Never  Partners: Male    Birth control/protection: Surgical    Comment: TAH--?ovaries remain  Other Topics Concern   Not on file  Social History Narrative   Divorced 1 son    Retired Administrator, Sports for [employer]   Former smoker no alcohol caffeine or drug use report denies abuse and feels safe at home.   Social Drivers of Health   Tobacco Use: High Risk (02/09/2024)   Patient History    Smoking Tobacco Use: Some Days    Smokeless Tobacco Use: Never    Passive Exposure: Not on file  Financial Resource Strain: Not on file  Food Insecurity: Low Risk (06/08/2022)   Received from [facility]   Epic    Within the past 12 months, you worried that your food would run out before you got money to buy more: Never  true    Within the past 12 months, the food you bought just didn't last and you didn't have money to get more. : Never true  Transportation Needs: No Transportation Needs (06/08/2022)   Received from [facility]    In the past 12 months, has lack of reliable transportation kept you from medical appointments, meetings, work or from getting things needed for daily living? : No  Physical Activity: Not on file  Stress: Not on file  Social Connections: Not on file  Intimate Partner Violence: Not At Risk (11/23/2021)   Humiliation, Afraid, Rape, and Kick questionnaire    Fear of Current or Ex-Partner: No    Emotionally Abused: No    Physically Abused: No    Sexually Abused: No  Depression (PHQ2-9): Low Risk (07/26/2023)   Depression (PHQ2-9)    PHQ-2 Score: 1  Alcohol Screen: Not on file  Housing: Low Risk (06/08/2022)   Received from [facility]   Epic    What is your living situation today?: I have a steady place to live    Think about the place you live. Do you have problems with any of the following? Choose all that apply:: Not on file  Utilities: Low Risk (06/08/2022)   Received from [facility]   Utilities    In the past 12 months has the electric, gas, oil, or water company threatened to shut off services in your home? : No  Health Literacy: Not on file   Family History  Problem Relation Age of Onset   Hypertension Mother    Sleep apnea Mother    Diabetes Mother    Hyperlipidemia Mother    Lung cancer Father        smoker   Hypertension Sister    Diabetes Sister    Hypertension Sister    Hypertension Brother    Diabetes Brother    Hypertension Brother    Breast cancer Neg Hx    Colon cancer Neg Hx    Esophageal cancer Neg Hx    Pancreatic cancer Neg Hx    Liver disease Neg Hx    Colon polyps Neg Hx    BRCA 1/2 Neg Hx     Current Outpatient Medications  Medication Sig Dispense Refill   atorvastatin  (LIPITOR ) 40 MG tablet Take 40 mg by mouth at  bedtime.     blood glucose meter kit and supplies KIT Dispense based on patient and insurance preference. Use up to four times daily as directed. 1 each 0   Blood Glucose Monitoring Suppl (ONETOUCH VERIO FLEX SYSTEM) w/Device KIT 1 each by Does not apply route 2 (  two) times daily. E11.9     Continuous Blood Gluc Receiver (DEXCOM G7 RECEIVER) DEVI Used to monitor blood sugar. Replace once per year.     Continuous Glucose Sensor (DEXCOM G7 SENSOR) MISC Change sensor every 10 days as directed by manufacturer 6 each 6   glucose blood (ONETOUCH VERIO) test strip USE AS DIRECTED TWICE DAILY 200 strip 3   insulin  glargine (LANTUS  SOLOSTAR) 100 UNIT/ML Solostar Pen Inject 8 Units into the skin daily. 15 mL 4   insulin  lispro (HUMALOG ) 100 UNIT/ML KwikPen 3 to 4 units before each meal 15 mL 3   Insulin  Pen Needle 32G X 4 MM MISC Use 4x a day 300 each 3   JARDIANCE  10 MG TABS tablet Take 1 tablet (10 mg total) by mouth in the morning. 90 tablet 3   metoprolol  succinate (TOPROL -XL) 100 MG 24 hr tablet Take 100 mg by mouth in the morning.     Multiple Vitamin (MULTIVITAMIN WITH MINERALS) TABS tablet Take 1 tablet by mouth in the morning.     mycophenolate (CELLCEPT) 250 MG capsule Take 250 mg by mouth 2 (two) times daily.     NIFEdipine (PROCARDIA XL/NIFEDICAL XL) 60 MG 24 hr tablet Take 60 mg by mouth 2 (two) times daily.     OneTouch Delica Lancets 33G MISC 1 each by Does not apply route 2 (two) times daily. E11.9     pantoprazole  (PROTONIX ) 40 MG tablet Take 1 tablet (40 mg total) by mouth daily. 90 tablet 3   polyethylene glycol (MIRALAX  / GLYCOLAX ) 17 g packet Take 17 g by mouth daily as needed (constipation.).     predniSONE  (DELTASONE ) 5 MG tablet Take 5 mg by mouth in the morning.     spironolactone  (ALDACTONE ) 50 MG tablet Take 1 tablet (50 mg total) by mouth 2 (two) times daily. 180 tablet 3   tacrolimus  (PROGRAF ) 1 MG capsule Take 3 mg by mouth in the morning and at bedtime.     Varenicline  Tartrate, Starter, 0.5 MG X 11 & 1 MG X 42 TBPK Take 0.5-1 mg by mouth as directed. TAKE 1 TABLET DAILY ON DAYS 1-3 AND 1 TABLET TWICE DAILY ON DAYS 4-7. THEN 1 TAB TWICE DAIL     No current facility-administered medications for this visit.    Allergies[1]   REVIEW OF SYSTEMS:  [X]  denotes positive finding, [ ]  denotes negative finding Cardiac  Comments:  Chest pain or chest pressure:    Shortness of breath upon exertion:    Short of breath when lying flat:    Irregular heart rhythm:        Vascular    Pain in calf, thigh, or hip brought on by ambulation:    Pain in feet at night that wakes you up from your sleep:     Blood clot in your veins:    Leg swelling:         Pulmonary    Oxygen at home:    Productive cough:     Wheezing:         Neurologic    Sudden weakness in arms or legs:     Sudden numbness in arms or legs:     Sudden onset of difficulty speaking or slurred speech:    Temporary loss of vision in one eye:     Problems with dizziness:         Gastrointestinal    Blood in stool:     Vomited blood:  Genitourinary    Burning when urinating:     Blood in urine:        Psychiatric    Major depression:         Hematologic    Bleeding problems:    Problems with blood clotting too easily:        Skin    Rashes or ulcers:        Constitutional    Fever or chills:      PHYSICAL EXAMINATION:  There were no vitals filed for this visit.  General:  WDWN in NAD; vital signs documented above Gait: Not observed HENT: WNL, normocephalic Pulmonary: normal non-labored breathing , without wheezing Cardiac: regular HR Abdomen: soft, NT, no masses Skin: without rashes Vascular Exam/Pulses:  Right Left  Radial 2+ (normal) 2+ (normal)                       Extremities: without ischemic changes, without Gangrene , without cellulitis; without open wounds;  Musculoskeletal: no muscle wasting or atrophy  Neurologic: A&O X 3;  No focal weakness or  paresthesias are detected Psychiatric:  The pt has Normal affect.   Non-Invasive Vascular Imaging:    +-------+-----------+-----------+------------+------------+  ABI/TBIToday's ABIToday's TBIPrevious ABIPrevious TBI  +-------+-----------+-----------+------------+------------+  Right 0.48       0.15                                 +-------+-----------+-----------+------------+------------+  Left  0.59       0.40                                 +-------+-----------+-----------+------------+------------+       ASSESSMENT/PLAN: [name] is a 74 y.o. female presenting with bilateral lower extremity peripheral arterial disease.  Fortunately, she is not very symptomatic.  On physical exam, she had palpable femoral pulses, nonpalpable pedal pulses. ABI was reviewed demonstrating moderate to severe peripheral arterial disease bilaterally.  The intermittent cramping she describes overnight is inconsistent with ischemic rest pain.  Furthermore, claudication symptoms are relatively mild.  She states that the cramps did not hold her back in any way.  I had a long conversation with [name] regarding the above.  She is aware that should we intervene on her peripheral arterial disease, this would take dye which could threaten her kidney transplant.  She is not interested in any intervention at this time, and stated that she would call if ischemic rest pain or new tissue loss develop.  I think this is very reasonable.  My plan is to see her in 6 months time    Fonda FORBES Rim, MD [facility] [phone]     [1]  Allergies Allergen Reactions   Tape Other (See Comments)    Paper tape rips skin.    Semaglutide  Rash    Rybelsus  3 mg   "

## 2024-03-05 ENCOUNTER — Encounter: Admitting: Vascular Surgery

## 2024-03-22 ENCOUNTER — Ambulatory Visit: Admitting: Endocrinology

## 2024-04-02 ENCOUNTER — Ambulatory Visit: Admitting: Podiatry
